# Patient Record
Sex: Female | Born: 1950
Health system: Southern US, Community
[De-identification: ages and names within clinical notes are randomized; demographics above are authoritative.]

## PROBLEM LIST (undated history)

## (undated) DIAGNOSIS — F329 Major depressive disorder, single episode, unspecified: Secondary | ICD-10-CM

## (undated) DIAGNOSIS — I10 Essential (primary) hypertension: Secondary | ICD-10-CM

## (undated) DIAGNOSIS — G4730 Sleep apnea, unspecified: Secondary | ICD-10-CM

## (undated) DIAGNOSIS — F419 Anxiety disorder, unspecified: Secondary | ICD-10-CM

## (undated) DIAGNOSIS — M1990 Unspecified osteoarthritis, unspecified site: Secondary | ICD-10-CM

## (undated) DIAGNOSIS — K219 Gastro-esophageal reflux disease without esophagitis: Secondary | ICD-10-CM

## (undated) DIAGNOSIS — F32A Depression, unspecified: Secondary | ICD-10-CM

## (undated) DIAGNOSIS — E079 Disorder of thyroid, unspecified: Secondary | ICD-10-CM

## (undated) DIAGNOSIS — J449 Chronic obstructive pulmonary disease, unspecified: Secondary | ICD-10-CM

## (undated) DIAGNOSIS — C3491 Malignant neoplasm of unspecified part of right bronchus or lung: Principal | ICD-10-CM

## (undated) HISTORY — PX: ANKLE SURGERY: SHX546

## (undated) HISTORY — DX: Depression, unspecified: F32.A

## (undated) HISTORY — DX: Chronic obstructive pulmonary disease, unspecified: J44.9

## (undated) HISTORY — PX: REPLACEMENT TOTAL KNEE: SUR1224

## (undated) HISTORY — PX: EYE MUSCLE SURGERY: SHX370

## (undated) HISTORY — PX: BREAST EXCISIONAL BIOPSY: SUR124

## (undated) HISTORY — DX: Essential (primary) hypertension: I10

## (undated) HISTORY — DX: Unspecified osteoarthritis, unspecified site: M19.90

## (undated) HISTORY — DX: Major depressive disorder, single episode, unspecified: F32.9

## (undated) HISTORY — DX: Gastro-esophageal reflux disease without esophagitis: K21.9

## (undated) HISTORY — DX: Disorder of thyroid, unspecified: E07.9

## (undated) HISTORY — PX: APPENDECTOMY: SHX54

## (undated) HISTORY — PX: THYROIDECTOMY: SHX17

## (undated) HISTORY — PX: CHOLECYSTECTOMY: SHX55

## (undated) HISTORY — DX: Malignant neoplasm of unspecified part of right bronchus or lung: C34.91

## (undated) HISTORY — DX: Anxiety disorder, unspecified: F41.9

---

## 2009-08-01 ENCOUNTER — Encounter: Admission: RE | Admit: 2009-08-01 | Discharge: 2009-08-01 | Payer: Self-pay

## 2015-01-17 DIAGNOSIS — E890 Postprocedural hypothyroidism: Secondary | ICD-10-CM

## 2015-01-17 HISTORY — DX: Postprocedural hypothyroidism: E89.0

## 2015-05-12 DIAGNOSIS — M4310 Spondylolisthesis, site unspecified: Secondary | ICD-10-CM

## 2015-05-12 HISTORY — DX: Spondylolisthesis, site unspecified: M43.10

## 2015-06-10 DIAGNOSIS — R0602 Shortness of breath: Secondary | ICD-10-CM | POA: Insufficient documentation

## 2015-07-22 DIAGNOSIS — C3491 Malignant neoplasm of unspecified part of right bronchus or lung: Secondary | ICD-10-CM | POA: Insufficient documentation

## 2015-07-22 DIAGNOSIS — E782 Mixed hyperlipidemia: Secondary | ICD-10-CM | POA: Insufficient documentation

## 2015-07-22 DIAGNOSIS — E785 Hyperlipidemia, unspecified: Secondary | ICD-10-CM | POA: Insufficient documentation

## 2015-07-26 DIAGNOSIS — Z902 Acquired absence of lung [part of]: Secondary | ICD-10-CM

## 2015-07-26 HISTORY — DX: Acquired absence of lung (part of): Z90.2

## 2015-09-03 DIAGNOSIS — R059 Cough, unspecified: Secondary | ICD-10-CM | POA: Insufficient documentation

## 2015-09-03 DIAGNOSIS — R05 Cough: Secondary | ICD-10-CM | POA: Insufficient documentation

## 2015-09-06 DIAGNOSIS — J683 Other acute and subacute respiratory conditions due to chemicals, gases, fumes and vapors: Secondary | ICD-10-CM | POA: Insufficient documentation

## 2015-09-06 HISTORY — DX: Other acute and subacute respiratory conditions due to chemicals, gases, fumes and vapors: J68.3

## 2015-12-05 DIAGNOSIS — C3411 Malignant neoplasm of upper lobe, right bronchus or lung: Secondary | ICD-10-CM

## 2016-01-01 DIAGNOSIS — C3411 Malignant neoplasm of upper lobe, right bronchus or lung: Secondary | ICD-10-CM | POA: Diagnosis not present

## 2016-01-01 DIAGNOSIS — R911 Solitary pulmonary nodule: Secondary | ICD-10-CM | POA: Diagnosis not present

## 2016-01-02 DIAGNOSIS — Z85118 Personal history of other malignant neoplasm of bronchus and lung: Secondary | ICD-10-CM

## 2016-01-27 DIAGNOSIS — R0600 Dyspnea, unspecified: Secondary | ICD-10-CM | POA: Diagnosis not present

## 2016-01-27 DIAGNOSIS — G4733 Obstructive sleep apnea (adult) (pediatric): Secondary | ICD-10-CM | POA: Diagnosis not present

## 2016-01-27 DIAGNOSIS — E559 Vitamin D deficiency, unspecified: Secondary | ICD-10-CM | POA: Diagnosis not present

## 2016-01-27 DIAGNOSIS — R911 Solitary pulmonary nodule: Secondary | ICD-10-CM | POA: Diagnosis not present

## 2016-01-27 DIAGNOSIS — J4530 Mild persistent asthma, uncomplicated: Secondary | ICD-10-CM | POA: Diagnosis not present

## 2016-01-29 DIAGNOSIS — Z139 Encounter for screening, unspecified: Secondary | ICD-10-CM | POA: Diagnosis not present

## 2016-01-29 DIAGNOSIS — E039 Hypothyroidism, unspecified: Secondary | ICD-10-CM | POA: Diagnosis not present

## 2016-01-29 DIAGNOSIS — Z85118 Personal history of other malignant neoplasm of bronchus and lung: Secondary | ICD-10-CM | POA: Diagnosis not present

## 2016-02-02 DIAGNOSIS — E039 Hypothyroidism, unspecified: Secondary | ICD-10-CM | POA: Diagnosis not present

## 2016-02-02 DIAGNOSIS — K219 Gastro-esophageal reflux disease without esophagitis: Secondary | ICD-10-CM | POA: Diagnosis not present

## 2016-02-02 DIAGNOSIS — R0602 Shortness of breath: Secondary | ICD-10-CM | POA: Diagnosis not present

## 2016-02-02 DIAGNOSIS — I10 Essential (primary) hypertension: Secondary | ICD-10-CM | POA: Diagnosis not present

## 2016-02-10 DIAGNOSIS — H2513 Age-related nuclear cataract, bilateral: Secondary | ICD-10-CM | POA: Diagnosis not present

## 2016-02-10 DIAGNOSIS — H524 Presbyopia: Secondary | ICD-10-CM | POA: Diagnosis not present

## 2016-02-10 DIAGNOSIS — H04123 Dry eye syndrome of bilateral lacrimal glands: Secondary | ICD-10-CM | POA: Diagnosis not present

## 2016-02-17 DIAGNOSIS — R300 Dysuria: Secondary | ICD-10-CM | POA: Diagnosis not present

## 2016-02-17 DIAGNOSIS — R197 Diarrhea, unspecified: Secondary | ICD-10-CM | POA: Diagnosis not present

## 2016-02-17 DIAGNOSIS — N390 Urinary tract infection, site not specified: Secondary | ICD-10-CM | POA: Diagnosis not present

## 2016-02-26 DIAGNOSIS — C3411 Malignant neoplasm of upper lobe, right bronchus or lung: Secondary | ICD-10-CM | POA: Diagnosis not present

## 2016-03-01 DIAGNOSIS — E039 Hypothyroidism, unspecified: Secondary | ICD-10-CM | POA: Diagnosis not present

## 2016-03-03 DIAGNOSIS — M9901 Segmental and somatic dysfunction of cervical region: Secondary | ICD-10-CM | POA: Diagnosis not present

## 2016-03-03 DIAGNOSIS — M9903 Segmental and somatic dysfunction of lumbar region: Secondary | ICD-10-CM | POA: Diagnosis not present

## 2016-03-03 DIAGNOSIS — N390 Urinary tract infection, site not specified: Secondary | ICD-10-CM | POA: Diagnosis not present

## 2016-03-03 DIAGNOSIS — M5417 Radiculopathy, lumbosacral region: Secondary | ICD-10-CM | POA: Diagnosis not present

## 2016-03-03 DIAGNOSIS — M531 Cervicobrachial syndrome: Secondary | ICD-10-CM | POA: Diagnosis not present

## 2016-03-03 DIAGNOSIS — R300 Dysuria: Secondary | ICD-10-CM | POA: Diagnosis not present

## 2016-03-03 DIAGNOSIS — O872 Hemorrhoids in the puerperium: Secondary | ICD-10-CM | POA: Diagnosis not present

## 2016-03-31 DIAGNOSIS — Z139 Encounter for screening, unspecified: Secondary | ICD-10-CM | POA: Diagnosis not present

## 2016-03-31 DIAGNOSIS — E039 Hypothyroidism, unspecified: Secondary | ICD-10-CM | POA: Diagnosis not present

## 2016-04-01 DIAGNOSIS — F419 Anxiety disorder, unspecified: Secondary | ICD-10-CM | POA: Diagnosis not present

## 2016-04-01 DIAGNOSIS — I10 Essential (primary) hypertension: Secondary | ICD-10-CM | POA: Diagnosis not present

## 2016-04-01 DIAGNOSIS — E039 Hypothyroidism, unspecified: Secondary | ICD-10-CM | POA: Diagnosis not present

## 2016-04-01 DIAGNOSIS — F4321 Adjustment disorder with depressed mood: Secondary | ICD-10-CM | POA: Diagnosis not present

## 2016-04-28 DIAGNOSIS — F4321 Adjustment disorder with depressed mood: Secondary | ICD-10-CM | POA: Diagnosis not present

## 2016-04-28 DIAGNOSIS — E559 Vitamin D deficiency, unspecified: Secondary | ICD-10-CM | POA: Diagnosis not present

## 2016-04-28 DIAGNOSIS — Z139 Encounter for screening, unspecified: Secondary | ICD-10-CM | POA: Diagnosis not present

## 2016-04-28 DIAGNOSIS — F419 Anxiety disorder, unspecified: Secondary | ICD-10-CM | POA: Diagnosis not present

## 2016-04-28 DIAGNOSIS — I10 Essential (primary) hypertension: Secondary | ICD-10-CM | POA: Diagnosis not present

## 2016-04-28 DIAGNOSIS — E039 Hypothyroidism, unspecified: Secondary | ICD-10-CM | POA: Diagnosis not present

## 2016-04-29 DIAGNOSIS — J439 Emphysema, unspecified: Secondary | ICD-10-CM | POA: Diagnosis not present

## 2016-04-29 DIAGNOSIS — J432 Centrilobular emphysema: Secondary | ICD-10-CM | POA: Diagnosis not present

## 2016-04-29 DIAGNOSIS — R911 Solitary pulmonary nodule: Secondary | ICD-10-CM | POA: Diagnosis not present

## 2016-04-29 DIAGNOSIS — I2510 Atherosclerotic heart disease of native coronary artery without angina pectoris: Secondary | ICD-10-CM | POA: Diagnosis not present

## 2016-04-29 DIAGNOSIS — C3411 Malignant neoplasm of upper lobe, right bronchus or lung: Secondary | ICD-10-CM | POA: Diagnosis not present

## 2016-04-30 DIAGNOSIS — C3411 Malignant neoplasm of upper lobe, right bronchus or lung: Secondary | ICD-10-CM | POA: Diagnosis not present

## 2016-04-30 DIAGNOSIS — Z85118 Personal history of other malignant neoplasm of bronchus and lung: Secondary | ICD-10-CM | POA: Diagnosis not present

## 2016-05-19 DIAGNOSIS — F4321 Adjustment disorder with depressed mood: Secondary | ICD-10-CM | POA: Diagnosis not present

## 2016-05-19 DIAGNOSIS — F419 Anxiety disorder, unspecified: Secondary | ICD-10-CM | POA: Diagnosis not present

## 2016-05-19 DIAGNOSIS — K219 Gastro-esophageal reflux disease without esophagitis: Secondary | ICD-10-CM | POA: Diagnosis not present

## 2016-05-19 DIAGNOSIS — I10 Essential (primary) hypertension: Secondary | ICD-10-CM | POA: Diagnosis not present

## 2016-05-24 DIAGNOSIS — L7601 Intraoperative hemorrhage and hematoma of skin and subcutaneous tissue complicating a dermatologic procedure: Secondary | ICD-10-CM | POA: Diagnosis not present

## 2016-05-24 DIAGNOSIS — Z139 Encounter for screening, unspecified: Secondary | ICD-10-CM | POA: Diagnosis not present

## 2016-06-04 DIAGNOSIS — S51822A Laceration with foreign body of left forearm, initial encounter: Secondary | ICD-10-CM | POA: Diagnosis not present

## 2016-06-04 DIAGNOSIS — L7632 Postprocedural hematoma of skin and subcutaneous tissue following other procedure: Secondary | ICD-10-CM | POA: Diagnosis not present

## 2016-06-07 DIAGNOSIS — L7632 Postprocedural hematoma of skin and subcutaneous tissue following other procedure: Secondary | ICD-10-CM | POA: Diagnosis not present

## 2016-06-07 DIAGNOSIS — Z139 Encounter for screening, unspecified: Secondary | ICD-10-CM | POA: Diagnosis not present

## 2016-06-07 DIAGNOSIS — S51822D Laceration with foreign body of left forearm, subsequent encounter: Secondary | ICD-10-CM | POA: Diagnosis not present

## 2016-06-14 DIAGNOSIS — S51822D Laceration with foreign body of left forearm, subsequent encounter: Secondary | ICD-10-CM | POA: Diagnosis not present

## 2016-06-14 DIAGNOSIS — L7632 Postprocedural hematoma of skin and subcutaneous tissue following other procedure: Secondary | ICD-10-CM | POA: Diagnosis not present

## 2016-07-01 ENCOUNTER — Encounter: Payer: Self-pay | Admitting: Internal Medicine

## 2016-07-13 DIAGNOSIS — E039 Hypothyroidism, unspecified: Secondary | ICD-10-CM | POA: Diagnosis not present

## 2016-07-13 DIAGNOSIS — I10 Essential (primary) hypertension: Secondary | ICD-10-CM | POA: Diagnosis not present

## 2016-07-13 DIAGNOSIS — Z139 Encounter for screening, unspecified: Secondary | ICD-10-CM | POA: Diagnosis not present

## 2016-07-14 DIAGNOSIS — F4321 Adjustment disorder with depressed mood: Secondary | ICD-10-CM | POA: Diagnosis not present

## 2016-07-14 DIAGNOSIS — L719 Rosacea, unspecified: Secondary | ICD-10-CM | POA: Diagnosis not present

## 2016-07-14 DIAGNOSIS — E039 Hypothyroidism, unspecified: Secondary | ICD-10-CM | POA: Diagnosis not present

## 2016-07-14 DIAGNOSIS — I10 Essential (primary) hypertension: Secondary | ICD-10-CM | POA: Diagnosis not present

## 2016-07-23 ENCOUNTER — Other Ambulatory Visit: Payer: Self-pay | Admitting: Medical Oncology

## 2016-07-23 DIAGNOSIS — C3490 Malignant neoplasm of unspecified part of unspecified bronchus or lung: Secondary | ICD-10-CM

## 2016-07-26 ENCOUNTER — Ambulatory Visit (HOSPITAL_BASED_OUTPATIENT_CLINIC_OR_DEPARTMENT_OTHER): Payer: Medicare Other | Admitting: Internal Medicine

## 2016-07-26 ENCOUNTER — Other Ambulatory Visit (HOSPITAL_BASED_OUTPATIENT_CLINIC_OR_DEPARTMENT_OTHER): Payer: Medicare Other

## 2016-07-26 ENCOUNTER — Other Ambulatory Visit: Payer: Medicare Other

## 2016-07-26 ENCOUNTER — Encounter: Payer: Self-pay | Admitting: Internal Medicine

## 2016-07-26 ENCOUNTER — Telehealth: Payer: Self-pay | Admitting: Internal Medicine

## 2016-07-26 DIAGNOSIS — E039 Hypothyroidism, unspecified: Secondary | ICD-10-CM

## 2016-07-26 DIAGNOSIS — C3491 Malignant neoplasm of unspecified part of right bronchus or lung: Secondary | ICD-10-CM

## 2016-07-26 DIAGNOSIS — F329 Major depressive disorder, single episode, unspecified: Secondary | ICD-10-CM | POA: Diagnosis not present

## 2016-07-26 DIAGNOSIS — C3411 Malignant neoplasm of upper lobe, right bronchus or lung: Secondary | ICD-10-CM

## 2016-07-26 DIAGNOSIS — F411 Generalized anxiety disorder: Secondary | ICD-10-CM

## 2016-07-26 DIAGNOSIS — J449 Chronic obstructive pulmonary disease, unspecified: Secondary | ICD-10-CM

## 2016-07-26 DIAGNOSIS — C3490 Malignant neoplasm of unspecified part of unspecified bronchus or lung: Secondary | ICD-10-CM

## 2016-07-26 DIAGNOSIS — I10 Essential (primary) hypertension: Secondary | ICD-10-CM

## 2016-07-26 HISTORY — DX: Malignant neoplasm of unspecified part of right bronchus or lung: C34.91

## 2016-07-26 LAB — CBC WITH DIFFERENTIAL/PLATELET
BASO%: 0.4 % (ref 0.0–2.0)
Basophils Absolute: 0 10*3/uL (ref 0.0–0.1)
EOS%: 1.2 % (ref 0.0–7.0)
Eosinophils Absolute: 0.1 10*3/uL (ref 0.0–0.5)
HCT: 39.2 % (ref 34.8–46.6)
HGB: 12.5 g/dL (ref 11.6–15.9)
LYMPH%: 25.3 % (ref 14.0–49.7)
MCH: 27.8 pg (ref 25.1–34.0)
MCHC: 31.9 g/dL (ref 31.5–36.0)
MCV: 87.1 fL (ref 79.5–101.0)
MONO#: 0.4 10*3/uL (ref 0.1–0.9)
MONO%: 8.8 % (ref 0.0–14.0)
NEUT#: 3.2 10*3/uL (ref 1.5–6.5)
NEUT%: 64.3 % (ref 38.4–76.8)
Platelets: 233 10*3/uL (ref 145–400)
RBC: 4.5 10*6/uL (ref 3.70–5.45)
RDW: 14.7 % — ABNORMAL HIGH (ref 11.2–14.5)
WBC: 5 10*3/uL (ref 3.9–10.3)
lymph#: 1.3 10*3/uL (ref 0.9–3.3)

## 2016-07-26 LAB — COMPREHENSIVE METABOLIC PANEL
ALBUMIN: 3.7 g/dL (ref 3.5–5.0)
ALK PHOS: 121 U/L (ref 40–150)
ALT: 17 U/L (ref 0–55)
AST: 15 U/L (ref 5–34)
Anion Gap: 9 mEq/L (ref 3–11)
BILIRUBIN TOTAL: 0.51 mg/dL (ref 0.20–1.20)
BUN: 20.9 mg/dL (ref 7.0–26.0)
CO2: 26 mEq/L (ref 22–29)
Calcium: 9.4 mg/dL (ref 8.4–10.4)
Chloride: 106 mEq/L (ref 98–109)
Creatinine: 0.7 mg/dL (ref 0.6–1.1)
EGFR: 85 mL/min/{1.73_m2} — AB (ref >90)
GLUCOSE: 93 mg/dL (ref 70–140)
Potassium: 3.7 mEq/L (ref 3.5–5.1)
SODIUM: 140 meq/L (ref 136–145)
TOTAL PROTEIN: 6.8 g/dL (ref 6.4–8.3)

## 2016-07-26 NOTE — Telephone Encounter (Signed)
Gave pt cal & avs °

## 2016-07-26 NOTE — Progress Notes (Signed)
[facility] Telephone:([phone]) [phone]   [phone]  CONSULT NOTE  REFERRING PHYSICIAN: Dr. Kristine Royal, [place], [place]  REASON FOR CONSULTATION:  65 years old white female with history of lung cancer.  HPI Victoria Pena is a 65 y.o. female was past medical history significant for depression/anxiety, hypothyroidism, GERD, hypertension and long history of smoking but quit in 2000. The patient was diagnosed with a stage IB (T2a, N0, M0) non-small cell lung cancer, adenocarcinoma status post right upper lobectomy with lymph node dissection in [facility] on 07/22/2015. She mentioned that she was visiting her daughter and she was complaining of low back pain. She was scheduled for back surgery and chest x-ray showed a nodule in the right upper lobe. This was followed by imaging studies including CT scan of the chest, PET scan as well as MRI of the brain that showed no other evidence of metastatic disease. She underwent right upper lobectomy with lymph node dissection and the tumor size was 4.1 cm with visceral pleural invasion. Following her surgical resection the patient underwent 4 cycles of adjuvant systemic chemotherapy with cisplatin and Alimta completed 11/17/2015 under the care of Dr. Ala Dach. She has been on observation since that time and she was seen in January 2017 by Dr. Bobby Rumpf in [place]. Repeat CT scan of the chest at that time showed questionable left upper lobe nodule and on repeat CT scan of the chest in May 2017 it decreased in size, close to resolution. The patient had some concerns about continuing her care in [place]. She is planning to go back to [place] at some point. She was referred by her primary oncologist to me today for further evaluation and to establish care with me. When seen today the patient is feeling fine with no specific complaints except for shortness breath with exertion and occasional right-sided chest pain. She exercises  at regular basis and she intentionally lost around 27 pounds. She has no nausea, vomiting, diarrhea or constipation. She has no headache or visual changes. She denied having any cough or hemoptysis. Family history significant for several family members with cancer including mother with female genitalia cancer, brother and father with bone cancer. The patient is a widow and has 3 children. Her son lives in [place]. She used to work as a Chief Strategy Officer. She has a history of smoking more than one pack per day for 35 years and she quit in 2000. She has no history of alcohol or drug abuse.  HPI  Past Medical History:  Diagnosis Date  . Adenocarcinoma of right lung, stage 1 ([place]) 07/26/2016  . Anxiety   . Arthritis   . COPD (chronic obstructive pulmonary disease) ([place])   . Depression   . GERD (gastroesophageal reflux disease)   . Hypertension   . Thyroid disease     Past Surgical History:  Procedure Laterality Date  . APPENDECTOMY    . CHOLECYSTECTOMY    . THYROIDECTOMY      Family History  Problem Relation Age of Onset  . Cancer Mother   . Cancer Father   . Cancer Brother     Social History Social History  Substance Use Topics  . Smoking status: Former Smoker    Packs/day: 1.50    Years: 35.00    Quit date: 07/27/1999  . Smokeless tobacco: Never Used  . Alcohol use No    Allergies  Allergen Reactions  . Dextromethorphan Hbr Shortness Of Breath  . Keflex [Cephalexin] Shortness Of  Breath    Hard time breathing  . Hydrocodone Other (See Comments)    Heart racing, hard to concentrate, face flushed  . Hydroxyzine Other (See Comments)    Numbness in lips  . Prednisone & Diphenhydramine Other (See Comments)    Face flushed, heart racing  . Avelox [Moxifloxacin Hcl In Nacl] Rash    Current Outpatient Prescriptions  Medication Sig Dispense Refill  . Albuterol (VENTOLIN IN) Inhale into the lungs.    . ALPRAZolam (XANAX) 0.5 MG tablet Take 0.5 mg by mouth at bedtime as  needed for anxiety.    Marland Kitchen aspirin 81 MG tablet Take 81 mg by mouth daily.    Marland Kitchen buPROPion (WELLBUTRIN XL) 150 MG 24 hr tablet Take 150 mg by mouth daily.    Marland Kitchen levothyroxine (SYNTHROID, LEVOTHROID) 125 MCG tablet Take 125 mcg by mouth daily before breakfast.    . pantoprazole (PROTONIX) 40 MG tablet Take 40 mg by mouth daily.     No current facility-administered medications for this visit.     Review of Systems  Constitutional: positive for weight loss Eyes: negative Ears, nose, mouth, throat, and face: negative Respiratory: positive for dyspnea on exertion Cardiovascular: negative Gastrointestinal: negative Genitourinary:negative Integument/breast: negative Hematologic/lymphatic: negative Musculoskeletal:negative Neurological: negative Behavioral/Psych: negative Endocrine: negative Allergic/Immunologic: negative  Physical Exam  YKD:XIPJA, healthy, no distress, well nourished and well developed SKIN: skin color, texture, turgor are normal, no rashes or significant lesions HEAD: Normocephalic, No masses, lesions, tenderness or abnormalities EYES: normal, PERRLA, Conjunctiva are pink and non-injected EARS: External ears normal, Canals clear OROPHARYNX:no exudate, no erythema and lips, buccal mucosa, and tongue normal  NECK: supple, no adenopathy, no JVD LYMPH:  no palpable lymphadenopathy, no hepatosplenomegaly BREAST:not examined LUNGS: clear to auscultation , and palpation HEART: regular rate & rhythm, no murmurs and no gallops ABDOMEN:abdomen soft, non-tender, normal bowel sounds and no masses or organomegaly BACK: Back symmetric, no curvature., No CVA tenderness EXTREMITIES:no joint deformities, effusion, or inflammation, no edema, no skin discoloration  NEURO: alert & oriented x 3 with fluent speech, no focal motor/sensory deficits  PERFORMANCE STATUS: ECOG 0  LABORATORY DATA: Lab Results  Component Value Date   WBC 5.0 07/26/2016   HGB 12.5 07/26/2016   HCT 39.2  07/26/2016   MCV 87.1 07/26/2016   PLT 233 07/26/2016      Chemistry      Component Value Date/Time   NA 140 07/26/2016 1353   K 3.7 07/26/2016 1353   CO2 26 07/26/2016 1353   BUN 20.9 07/26/2016 1353   CREATININE 0.7 07/26/2016 1353      Component Value Date/Time   CALCIUM 9.4 07/26/2016 1353   ALKPHOS 121 07/26/2016 1353   AST 15 07/26/2016 1353   ALT 17 07/26/2016 1353   BILITOT 0.51 07/26/2016 1353       RADIOGRAPHIC STUDIES: No results found.  ASSESSMENT: This is a very pleasant 65 years old white female diagnosed with a stage IB (T2a, N0, M0) non-small cell lung cancer, adenocarcinoma status post right upper lobectomy with lymph node dissection followed by 4 cycles of adjuvant systemic chemotherapy with cisplatin and Alimta completed in November 2016.   PLAN: I had a lengthy discussion with the patient today about her current disease stage, prognosis and treatment options. I explained to the patient that she received the best standard of care at this point, including right upper lobectomy with lymph node dissection followed by 4 cycles of adjuvant systemic chemotherapy with cisplatin and Alimta. I also explained to  the patient that she would continue on observation for now with repeat CT scan of the chest every 6 months. Her last scan was performed in May 2017. I will arrange for the patient to have repeat CT scan of the chest in November 2017. I will see her back for follow-up visit at that time for reevaluation and discussion of her scan results. The patient was advised to call immediately if she has any concerning symptoms in the interval. The patient voices understanding of current disease status and treatment options and is in agreement with the current care plan.  All questions were answered. The patient knows to call the clinic with any problems, questions or concerns. We can certainly see the patient much sooner if necessary.  Thank you so much for allowing me to  participate in the care of Victoria Pena. I will continue to follow up the patient with you and assist in her care.  I spent 40 minutes counseling the patient face to face. The total time spent in the appointment was 60 minutes.  Disclaimer: This note was dictated with voice recognition software. Similar sounding words can inadvertently be transcribed and may not be corrected upon review.   Victoria Pena K. July 26, 2016, 2:57 PM

## 2016-08-31 DIAGNOSIS — R300 Dysuria: Secondary | ICD-10-CM | POA: Diagnosis not present

## 2016-08-31 DIAGNOSIS — N390 Urinary tract infection, site not specified: Secondary | ICD-10-CM | POA: Diagnosis not present

## 2016-09-09 DIAGNOSIS — K623 Rectal prolapse: Secondary | ICD-10-CM | POA: Diagnosis not present

## 2016-09-09 DIAGNOSIS — Z01419 Encounter for gynecological examination (general) (routine) without abnormal findings: Secondary | ICD-10-CM | POA: Diagnosis not present

## 2016-09-28 DIAGNOSIS — E039 Hypothyroidism, unspecified: Secondary | ICD-10-CM | POA: Diagnosis not present

## 2016-09-28 DIAGNOSIS — Z23 Encounter for immunization: Secondary | ICD-10-CM | POA: Diagnosis not present

## 2016-09-28 DIAGNOSIS — I10 Essential (primary) hypertension: Secondary | ICD-10-CM | POA: Diagnosis not present

## 2016-09-29 DIAGNOSIS — E782 Mixed hyperlipidemia: Secondary | ICD-10-CM | POA: Diagnosis not present

## 2016-09-29 DIAGNOSIS — E039 Hypothyroidism, unspecified: Secondary | ICD-10-CM | POA: Diagnosis not present

## 2016-09-30 DIAGNOSIS — E039 Hypothyroidism, unspecified: Secondary | ICD-10-CM | POA: Diagnosis not present

## 2016-09-30 DIAGNOSIS — Z1389 Encounter for screening for other disorder: Secondary | ICD-10-CM | POA: Diagnosis not present

## 2016-09-30 DIAGNOSIS — F419 Anxiety disorder, unspecified: Secondary | ICD-10-CM | POA: Diagnosis not present

## 2016-09-30 DIAGNOSIS — R609 Edema, unspecified: Secondary | ICD-10-CM | POA: Diagnosis not present

## 2016-09-30 DIAGNOSIS — F4321 Adjustment disorder with depressed mood: Secondary | ICD-10-CM | POA: Diagnosis not present

## 2016-09-30 DIAGNOSIS — K219 Gastro-esophageal reflux disease without esophagitis: Secondary | ICD-10-CM | POA: Diagnosis not present

## 2016-09-30 DIAGNOSIS — Z79899 Other long term (current) drug therapy: Secondary | ICD-10-CM | POA: Diagnosis not present

## 2016-09-30 DIAGNOSIS — R799 Abnormal finding of blood chemistry, unspecified: Secondary | ICD-10-CM | POA: Diagnosis not present

## 2016-10-01 ENCOUNTER — Encounter: Payer: Self-pay | Admitting: Sports Medicine

## 2016-10-01 ENCOUNTER — Ambulatory Visit (INDEPENDENT_AMBULATORY_CARE_PROVIDER_SITE_OTHER): Payer: Medicare Other | Admitting: Sports Medicine

## 2016-10-01 DIAGNOSIS — M76821 Posterior tibial tendinitis, right leg: Secondary | ICD-10-CM

## 2016-10-01 DIAGNOSIS — M79672 Pain in left foot: Secondary | ICD-10-CM | POA: Diagnosis not present

## 2016-10-01 DIAGNOSIS — M2142 Flat foot [pes planus] (acquired), left foot: Secondary | ICD-10-CM

## 2016-10-01 DIAGNOSIS — M76822 Posterior tibial tendinitis, left leg: Principal | ICD-10-CM

## 2016-10-01 DIAGNOSIS — M722 Plantar fascial fibromatosis: Secondary | ICD-10-CM

## 2016-10-01 DIAGNOSIS — M2141 Flat foot [pes planus] (acquired), right foot: Secondary | ICD-10-CM | POA: Diagnosis not present

## 2016-10-01 DIAGNOSIS — M79671 Pain in right foot: Secondary | ICD-10-CM | POA: Diagnosis not present

## 2016-10-01 MED ORDER — MELOXICAM 15 MG PO TABS
15.0000 mg | ORAL_TABLET | Freq: Every day | ORAL | 0 refills | Status: DC
Start: 2016-10-01 — End: 2017-03-16

## 2016-10-01 NOTE — Progress Notes (Signed)
Subjective: Victoria Pena is a 65 y.o. female patient who presents to office for evaluation for orthotics. Patient states that her feet feel sore at ankle and along inside of foot and ankle even where she had tendon surgery many years ago. Patient denies any other pedal complaints.   Patient Active Problem List   Diagnosis Date Noted  . Adenocarcinoma of right lung, stage 1 ([place]) 07/26/2016   Current Outpatient Prescriptions on File Prior to Visit  Medication Sig Dispense Refill  . Albuterol (VENTOLIN IN) Inhale into the lungs.    . ALPRAZolam (XANAX) 0.5 MG tablet Take 0.5 mg by mouth at bedtime as needed for anxiety.    Marland Kitchen aspirin 81 MG tablet Take 81 mg by mouth daily.    Marland Kitchen buPROPion (WELLBUTRIN XL) 150 MG 24 hr tablet Take 150 mg by mouth daily.    Marland Kitchen levothyroxine (SYNTHROID, LEVOTHROID) 125 MCG tablet Take 125 mcg by mouth daily before breakfast.    . pantoprazole (PROTONIX) 40 MG tablet Take 40 mg by mouth daily.     No current facility-administered medications on file prior to visit.    Allergies  Allergen Reactions  . Dextromethorphan Hbr Shortness Of Breath  . Keflex [Cephalexin] Shortness Of Breath    Hard time breathing  . Hydrocodone Other (See Comments) and Anxiety    Heart racing, hard to concentrate, face flushed  . Hydroxyzine Other (See Comments) and Swelling    Numbness in lips  . Prednisone & Diphenhydramine Other (See Comments)    Face flushed, heart racing  . Prednisone Other (See Comments)  . Avelox [Moxifloxacin Hcl In Nacl] Rash  . Moxifloxacin Rash     Objective:  General: Alert and oriented x3 in no acute distress  Dermatology: No open lesions bilateral lower extremities, no webspace macerations, no ecchymosis bilateral, all nails x 10 are well manicured.  Vascular: Dorsalis Pedis and Posterior Tibial pedal pulses 1/4, Capillary Fill Time 3 seconds, (+) pedal hair growth bilateral, + varicosities, no edema bilateral lower extremities, Temperature  gradient within normal limits.  Neurology: Gross sensation intact via light touch bilateral, Protective sensation intact with Thornell Mule Monofilament to all pedal sites,  No babinski sign present bilateral. (-) Tinels sign right or left foot.   Musculoskeletal: Minimal tenderness with palpation along medial arch, medial fascial band on Right>Left, minimal tenderness along Posterior tibial tendon course with medial soft tissue buldge noted, there is decreased ankle rom with knee extending  vs flexed resembling gastroc equnius bilateral with short limb on right by 1/4 in, Subtalar joint range of motion is within normal limits, there is no 1st ray hypermobility noted bilateral, decreased 1st MPJ rom Right>Left with functional limitus noted on weightbearing exam, there is medial arch collapse Right> Left on weightbearing exam,slight RF valgus Right> Left, no "too-many toes" sign appreciated.  Assessment and Plan: Problem List Items Addressed This Visit    None    Visit Diagnoses    Posterior tibial tendon dysfunction (PTTD) of both lower extremities    -  Primary   Relevant Medications   meloxicam (MOBIC) 15 MG tablet   Pes planus of both feet       Plantar fasciitis       Foot pain, bilateral       Relevant Medications   meloxicam (MOBIC) 15 MG tablet      -Complete examination performed -Xrays reviewed -Discussed treatement options -Recommend compression socks elastic therapy for occasional swelling -Rx Mobic for "soreness" in feet -Applied  felt heel lift to right OTC insert -Patient to return for casting with [facility]; Rx will allow to sulcus, heel lift on right and since patient is active will make for sports conditions. ABN for orthotics signed at today's visit; will bill when patient is casted -Patient to return to office for casting or sooner if condition worsens.  Landis Martins, DPM

## 2016-10-18 ENCOUNTER — Other Ambulatory Visit: Payer: Medicare Other

## 2016-10-28 ENCOUNTER — Other Ambulatory Visit (HOSPITAL_BASED_OUTPATIENT_CLINIC_OR_DEPARTMENT_OTHER): Payer: Medicare Other

## 2016-10-28 ENCOUNTER — Ambulatory Visit (HOSPITAL_COMMUNITY)
Admission: RE | Admit: 2016-10-28 | Discharge: 2016-10-28 | Disposition: A | Discharge status: Home / Self Care | Payer: Medicare Other | Source: Ambulatory Visit | Attending: Internal Medicine | Admitting: Internal Medicine

## 2016-10-28 DIAGNOSIS — C3491 Malignant neoplasm of unspecified part of right bronchus or lung: Secondary | ICD-10-CM

## 2016-10-28 DIAGNOSIS — J439 Emphysema, unspecified: Secondary | ICD-10-CM | POA: Insufficient documentation

## 2016-10-28 DIAGNOSIS — J984 Other disorders of lung: Secondary | ICD-10-CM | POA: Insufficient documentation

## 2016-10-28 DIAGNOSIS — C3411 Malignant neoplasm of upper lobe, right bronchus or lung: Secondary | ICD-10-CM | POA: Diagnosis present

## 2016-10-28 LAB — CBC WITH DIFFERENTIAL/PLATELET
BASO%: 0.7 % (ref 0.0–2.0)
Basophils Absolute: 0 10*3/uL (ref 0.0–0.1)
EOS%: 1.6 % (ref 0.0–7.0)
Eosinophils Absolute: 0.1 10*3/uL (ref 0.0–0.5)
HEMATOCRIT: 41 % (ref 34.8–46.6)
HGB: 13.1 g/dL (ref 11.6–15.9)
LYMPH#: 1.3 10*3/uL (ref 0.9–3.3)
LYMPH%: 26.6 % (ref 14.0–49.7)
MCH: 27.6 pg (ref 25.1–34.0)
MCHC: 32.1 g/dL (ref 31.5–36.0)
MCV: 86.2 fL (ref 79.5–101.0)
MONO#: 0.5 10*3/uL (ref 0.1–0.9)
MONO%: 9.1 % (ref 0.0–14.0)
NEUT%: 62 % (ref 38.4–76.8)
NEUTROS ABS: 3.1 10*3/uL (ref 1.5–6.5)
PLATELETS: 231 10*3/uL (ref 145–400)
RBC: 4.75 10*6/uL (ref 3.70–5.45)
RDW: 15 % — AB (ref 11.2–14.5)
WBC: 5 10*3/uL (ref 3.9–10.3)

## 2016-10-28 LAB — COMPREHENSIVE METABOLIC PANEL
ALT: 11 U/L (ref 0–55)
ANION GAP: 7 meq/L (ref 3–11)
AST: 14 U/L (ref 5–34)
Albumin: 3.4 g/dL — ABNORMAL LOW (ref 3.5–5.0)
Alkaline Phosphatase: 142 U/L (ref 40–150)
BILIRUBIN TOTAL: 0.59 mg/dL (ref 0.20–1.20)
BUN: 21 mg/dL (ref 7.0–26.0)
CO2: 25 meq/L (ref 22–29)
CREATININE: 0.8 mg/dL (ref 0.6–1.1)
Calcium: 9.3 mg/dL (ref 8.4–10.4)
Chloride: 107 mEq/L (ref 98–109)
EGFR: 81 mL/min/{1.73_m2} — ABNORMAL LOW (ref >90)
Glucose: 95 mg/dl (ref 70–140)
Potassium: 4.2 mEq/L (ref 3.5–5.1)
Sodium: 139 mEq/L (ref 136–145)
TOTAL PROTEIN: 6.8 g/dL (ref 6.4–8.3)

## 2016-10-28 MED ORDER — IOPAMIDOL (ISOVUE-300) INJECTION 61%
75.0000 mL | Freq: Once | INTRAVENOUS | Status: AC | PRN
  Administered 2016-10-28: 75 mL via INTRAVENOUS

## 2016-11-04 ENCOUNTER — Ambulatory Visit (INDEPENDENT_AMBULATORY_CARE_PROVIDER_SITE_OTHER): Payer: Medicare Other | Admitting: Sports Medicine

## 2016-11-04 DIAGNOSIS — M722 Plantar fascial fibromatosis: Secondary | ICD-10-CM

## 2016-11-04 DIAGNOSIS — M79672 Pain in left foot: Secondary | ICD-10-CM

## 2016-11-04 DIAGNOSIS — M2141 Flat foot [pes planus] (acquired), right foot: Secondary | ICD-10-CM

## 2016-11-04 DIAGNOSIS — M76822 Posterior tibial tendinitis, left leg: Principal | ICD-10-CM

## 2016-11-04 DIAGNOSIS — M79671 Pain in right foot: Secondary | ICD-10-CM

## 2016-11-04 DIAGNOSIS — M76821 Posterior tibial tendinitis, right leg: Secondary | ICD-10-CM

## 2016-11-04 DIAGNOSIS — M2142 Flat foot [pes planus] (acquired), left foot: Secondary | ICD-10-CM

## 2016-11-04 NOTE — Progress Notes (Signed)
Patient was seen by Asst. and casted for custom orthotics. Prescription sent to [facility] for custom functional foot orthotics to level of the sulcus with external heel post with 1/4 inch heel lift on right poly-pro firm with poron and vinyl top cover Patient to return for pickup of orthotics, When made available. -Dr. Cannon Kettle

## 2016-11-09 ENCOUNTER — Encounter: Payer: Self-pay | Admitting: Internal Medicine

## 2016-11-09 ENCOUNTER — Telehealth: Payer: Self-pay | Admitting: Internal Medicine

## 2016-11-09 ENCOUNTER — Ambulatory Visit (HOSPITAL_BASED_OUTPATIENT_CLINIC_OR_DEPARTMENT_OTHER): Payer: Medicare Other | Admitting: Internal Medicine

## 2016-11-09 VITALS — BP 137/81 | HR 72 | Temp 98.2°F | Resp 18 | Ht 64.0 in | Wt 165.0 lb

## 2016-11-09 DIAGNOSIS — C3411 Malignant neoplasm of upper lobe, right bronchus or lung: Secondary | ICD-10-CM | POA: Diagnosis not present

## 2016-11-09 DIAGNOSIS — C3491 Malignant neoplasm of unspecified part of right bronchus or lung: Secondary | ICD-10-CM

## 2016-11-09 NOTE — Progress Notes (Signed)
[facility] Telephone:([phone]   [phone]  OFFICE PROGRESS NOTE  Victoria Pena, [place] [address]  DIAGNOSIS: stage IB (T2a, N0, M0) non-small cell lung cancer, adenocarcinoma diagnosed in July 2016  PRIOR THERAPY: 1) status post right upper lobectomy with lymph node dissection on 07/22/2015 in [place]. 2) adjuvant systemic chemotherapy with cisplatin and Alimta status post 4 cycles completed in November 2016.  CURRENT THERAPY: Observation.  INTERVAL HISTORY: Victoria Pena 65 y.o. female returns to the clinic today for six-month follow-up visit. The patient is feeling fine today with no specific complaints. She gained around 10 pounds recently because she has to family reunion and she ate a lot. She started exercising again to lose weight. She continues to have occasional pain on the right side of the chest from the surgical scar. She denied having any shortness breath, cough or hemoptysis. She has no nausea or vomiting, no fever or chills. She had repeat CT scan of the chest performed recently and she is here for evaluation and discussion of her scan results.  MEDICAL HISTORY: Past Medical History:  Diagnosis Date  . Adenocarcinoma of right lung, stage 1 ([facility]) 07/26/2016  . Anxiety   . Arthritis   . COPD (chronic obstructive pulmonary disease) ([facility])   . Depression   . GERD (gastroesophageal reflux disease)   . Hypertension   . Thyroid disease     ALLERGIES:  is allergic to dextromethorphan hbr; keflex [cephalexin]; hydrocodone; hydroxyzine; prednisone & diphenhydramine; prednisone; avelox [moxifloxacin hcl in nacl]; and moxifloxacin.  MEDICATIONS:  Current Outpatient Prescriptions  Medication Sig Dispense Refill  . Albuterol (VENTOLIN IN) Inhale into the lungs.    . ALPRAZolam (XANAX) 0.5 MG tablet Take 0.5 mg by mouth at bedtime as needed for anxiety.    Marland Kitchen aspirin 81 MG tablet Take 81 mg by mouth  daily.    Marland Kitchen buPROPion (WELLBUTRIN XL) 150 MG 24 hr tablet Take 150 mg by mouth daily.    Marland Kitchen levothyroxine (SYNTHROID, LEVOTHROID) 125 MCG tablet Take 125 mcg by mouth daily before breakfast.    . meloxicam (MOBIC) 15 MG tablet Take 1 tablet (15 mg total) by mouth daily. 30 tablet 0  . pantoprazole (PROTONIX) 40 MG tablet Take 40 mg by mouth daily.     No current facility-administered medications for this visit.     SURGICAL HISTORY:  Past Surgical History:  Procedure Laterality Date  . APPENDECTOMY    . CHOLECYSTECTOMY    . THYROIDECTOMY      REVIEW OF SYSTEMS:  A comprehensive review of systems was negative.   PHYSICAL EXAMINATION: General appearance: alert, cooperative and no distress Head: Normocephalic, without obvious abnormality, atraumatic Neck: no adenopathy, no JVD, supple, symmetrical, trachea midline and thyroid not enlarged, symmetric, no tenderness/mass/nodules Lymph nodes: Cervical, supraclavicular, and axillary nodes normal. Resp: clear to auscultation bilaterally Back: symmetric, no curvature. ROM normal. No CVA tenderness. Cardio: regular rate and rhythm, S1, S2 normal, no murmur, click, rub or gallop GI: soft, non-tender; bowel sounds normal; no masses,  no organomegaly Extremities: extremities normal, atraumatic, no cyanosis or edema  ECOG PERFORMANCE STATUS: 1 - Symptomatic but completely ambulatory  Blood pressure 137/81, pulse 72, temperature 98.2 F (36.8 C), temperature source Oral, resp. rate 18, height '5\' 4"'$  (1.626 m), weight 165 lb (74.8 kg), SpO2 100 %.  LABORATORY DATA: Lab Results  Component Value Date   WBC 5.0 10/28/2016   HGB 13.1 10/28/2016  HCT 41.0 10/28/2016   MCV 86.2 10/28/2016   PLT 231 10/28/2016      Chemistry      Component Value Date/Time   NA 139 10/28/2016 1211   K 4.2 10/28/2016 1211   CO2 25 10/28/2016 1211   BUN 21.0 10/28/2016 1211   CREATININE 0.8 10/28/2016 1211      Component Value Date/Time   CALCIUM 9.3  10/28/2016 1211   ALKPHOS 142 10/28/2016 1211   AST 14 10/28/2016 1211   ALT 11 10/28/2016 1211   BILITOT 0.59 10/28/2016 1211       RADIOGRAPHIC STUDIES: Ct Chest W Contrast  Result Date: 10/28/2016 CLINICAL DATA:  Restaging non-small cell lung cancer. Status post right upper lobe lobectomy in 2016. EXAM: CT CHEST WITH CONTRAST TECHNIQUE: Multidetector CT imaging of the chest was performed during intravenous contrast administration. CONTRAST:  48m ISOVUE-300 IOPAMIDOL (ISOVUE-300) INJECTION 61% COMPARISON:  Chest CT 04/29/2016 FINDINGS: Chest wall: No breast masses, supraclavicular or axillary lymphadenopathy. Small scattered lymph nodes are stable. Surgical changes from a thyroidectomy are noted. Cardiovascular: The heart is normal in size. No pericardial effusion. The aorta is normal in caliber. No dissection. Stable coronary artery calcifications. Mediastinum/Nodes: Small scattered mediastinal and hilar lymph nodes but no mass or adenopathy. The esophagus is grossly normal. Lungs/Pleura: Stable emphysematous changes. Stable surgical changes from a right upper lobe lobectomy. No findings for recurrent tumor or adenopathy. No worrisome pulmonary nodules. No pleural effusion. Upper Abdomen: No significant upper abdominal findings. Renal cysts are noted. No adrenal gland or hepatic metastasis. Musculoskeletal: No significant bony findings. IMPRESSION: Stable CT appearance of the chest. Stable emphysematous changes, pulmonary scarring and postoperative changes. No findings for recurrent tumor, adenopathy or metastatic disease. Electronically Signed   By: PMarijo SanesM.D.   On: 10/28/2016 16:01    ASSESSMENT AND PLAN: This is a very pleasant 65years old white female with history of stage IB non-small cell lung cancer status post right upper lobectomy with lymph node dissection followed by 4 cycles of adjuvant systemic chemotherapy with cisplatin and Alimta. The patient is currently on observation  and the recent CT scan of the chest showed no clear evidence for disease recurrence. I discussed the scan results with the patient today. I recommended for her to continue on observation with repeat CT scan of the chest in 6 months. She was advised to call immediately if she has any concerning symptoms in the interval. The patient voices understanding of current disease status and treatment options and is in agreement with the current care plan.  All questions were answered. The patient knows to call the clinic with any problems, questions or concerns. We can certainly see the patient much sooner if necessary.  Disclaimer: This note was dictated with voice recognition software. Similar sounding words can inadvertently be transcribed and may not be corrected upon review.

## 2016-11-09 NOTE — Telephone Encounter (Signed)
Appointments scheduled per 11/09/16 los. A copy of the  AVS report and appointment schedule was given to patient,per 11/09/16 los.

## 2016-12-03 ENCOUNTER — Encounter: Payer: Medicare Other | Admitting: Sports Medicine

## 2016-12-16 DIAGNOSIS — M9903 Segmental and somatic dysfunction of lumbar region: Secondary | ICD-10-CM | POA: Diagnosis not present

## 2016-12-16 DIAGNOSIS — M5417 Radiculopathy, lumbosacral region: Secondary | ICD-10-CM | POA: Diagnosis not present

## 2016-12-16 DIAGNOSIS — M531 Cervicobrachial syndrome: Secondary | ICD-10-CM | POA: Diagnosis not present

## 2016-12-16 DIAGNOSIS — M9901 Segmental and somatic dysfunction of cervical region: Secondary | ICD-10-CM | POA: Diagnosis not present

## 2016-12-17 DIAGNOSIS — M549 Dorsalgia, unspecified: Secondary | ICD-10-CM | POA: Diagnosis not present

## 2016-12-17 DIAGNOSIS — L301 Dyshidrosis [pompholyx]: Secondary | ICD-10-CM | POA: Diagnosis not present

## 2016-12-17 DIAGNOSIS — E039 Hypothyroidism, unspecified: Secondary | ICD-10-CM | POA: Diagnosis not present

## 2016-12-19 DIAGNOSIS — I10 Essential (primary) hypertension: Secondary | ICD-10-CM | POA: Diagnosis not present

## 2016-12-19 DIAGNOSIS — F419 Anxiety disorder, unspecified: Secondary | ICD-10-CM | POA: Diagnosis not present

## 2016-12-22 ENCOUNTER — Ambulatory Visit (INDEPENDENT_AMBULATORY_CARE_PROVIDER_SITE_OTHER): Payer: Self-pay | Admitting: Sports Medicine

## 2016-12-22 ENCOUNTER — Encounter: Payer: Self-pay | Admitting: Sports Medicine

## 2016-12-22 DIAGNOSIS — M79672 Pain in left foot: Secondary | ICD-10-CM

## 2016-12-22 DIAGNOSIS — M722 Plantar fascial fibromatosis: Secondary | ICD-10-CM

## 2016-12-22 DIAGNOSIS — M76821 Posterior tibial tendinitis, right leg: Secondary | ICD-10-CM

## 2016-12-22 DIAGNOSIS — M2142 Flat foot [pes planus] (acquired), left foot: Secondary | ICD-10-CM

## 2016-12-22 DIAGNOSIS — M76822 Posterior tibial tendinitis, left leg: Principal | ICD-10-CM

## 2016-12-22 DIAGNOSIS — E039 Hypothyroidism, unspecified: Secondary | ICD-10-CM | POA: Diagnosis not present

## 2016-12-22 DIAGNOSIS — K219 Gastro-esophageal reflux disease without esophagitis: Secondary | ICD-10-CM | POA: Diagnosis not present

## 2016-12-22 DIAGNOSIS — F4321 Adjustment disorder with depressed mood: Secondary | ICD-10-CM | POA: Diagnosis not present

## 2016-12-22 DIAGNOSIS — M2141 Flat foot [pes planus] (acquired), right foot: Secondary | ICD-10-CM

## 2016-12-22 DIAGNOSIS — M79671 Pain in right foot: Secondary | ICD-10-CM

## 2016-12-22 NOTE — Patient Instructions (Signed)

## 2016-12-22 NOTE — Progress Notes (Signed)
Patient discussed with medical assistant. One pair of Custom Functional orthotics were fitted and dispensed to patient with wear instructions and break in period explained. Patient to follow up as scheduled for continued care or sooner if problems or issues arise. -Dr. Mathius Birkeland  

## 2016-12-24 ENCOUNTER — Ambulatory Visit (INDEPENDENT_AMBULATORY_CARE_PROVIDER_SITE_OTHER): Payer: Medicare Other | Admitting: Sports Medicine

## 2016-12-24 ENCOUNTER — Encounter: Payer: Self-pay | Admitting: Sports Medicine

## 2016-12-24 DIAGNOSIS — B359 Dermatophytosis, unspecified: Secondary | ICD-10-CM | POA: Diagnosis not present

## 2016-12-24 DIAGNOSIS — L988 Other specified disorders of the skin and subcutaneous tissue: Secondary | ICD-10-CM | POA: Diagnosis not present

## 2016-12-24 DIAGNOSIS — M79675 Pain in left toe(s): Secondary | ICD-10-CM

## 2016-12-24 MED ORDER — CLOTRIMAZOLE 1 % EX SOLN
CUTANEOUS | 5 refills | Status: DC
Start: 2016-12-24 — End: 2017-03-16

## 2016-12-24 NOTE — Progress Notes (Signed)
Subjective: Victoria Pena is a 66 y.o. female patient who presents to office for evaluation of "blister" in between toes on left. 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 Reports that her orthotics feel wonderful and its helping her back. Patient denies any other pedal complaints.   Patient Active Problem List   Diagnosis Date Noted  . Adenocarcinoma of right lung, stage 1 ([facility]) 07/26/2016   Current Outpatient Prescriptions on File Prior to Visit  Medication Sig Dispense Refill  . Albuterol (VENTOLIN IN) Inhale into the lungs.    . ALPRAZolam (XANAX) 0.5 MG tablet Take 0.5 mg by mouth at bedtime as needed for anxiety.    Marland Kitchen aspirin 81 MG tablet Take 81 mg by mouth daily.    Marland Kitchen buPROPion (WELLBUTRIN XL) 150 MG 24 hr tablet Take 150 mg by mouth daily.    Marland Kitchen levothyroxine (SYNTHROID, LEVOTHROID) 125 MCG tablet Take 125 mcg by mouth daily before breakfast.    . meloxicam (MOBIC) 15 MG tablet Take 1 tablet (15 mg total) by mouth daily. 30 tablet 0  . pantoprazole (PROTONIX) 40 MG tablet Take 40 mg by mouth daily.     No current facility-administered medications on file prior to visit.    Allergies  Allergen Reactions  . Dextromethorphan Hbr Shortness Of Breath  . Keflex [Cephalexin] Shortness Of Breath    Hard time breathing  . Hydrocodone Other (See Comments) and Anxiety    Heart racing, hard to concentrate, face flushed  . Hydroxyzine Other (See Comments) and Swelling    Numbness in lips  . Prednisone & Diphenhydramine Other (See Comments)    Face flushed, heart racing  . Prednisone Other (See Comments)  . Avelox [Moxifloxacin Hcl In Nacl] Rash  . Moxifloxacin Rash     Objective:  General: Alert and oriented x3 in no acute distress  Dermatology: + Left 4th webspace maceration with mild swelling and blistered skin, no cellulitis, no odor, no active drainage, no ecchymosis bilateral, all  nails x 10 are well manicured.  Vascular: Dorsalis Pedis and Posterior Tibial pedal pulses 1/4, Capillary Fill Time 3 seconds, (+) pedal hair growth bilateral, + varicosities, Temperature gradient within normal limits.  Neurology: Gross sensation intact via light touch bilateral.   Musculoskeletal: Minimal tenderness with palpation left 4-5 toes. Mild left 5th varus rotated hammertoe.   Assessment and Plan: Problem List Items Addressed This Visit    None    Visit Diagnoses    Tinea    -  Primary   Relevant Medications   clotrimazole (LOTRIMIN) 1 % external solution   Skin maceration       Toe pain, left          -Complete examination performed -Discussed treatement options for interdigital tinea -Applied betadine to 4th interspace on left -Rx clotrimazole solution to use at bedtime and betadine during the day -Advised rest and comfortable shoes -Patient decline pain injection to left 5th toe -Continue with custom orthotics  -Patient to return to office as needed or sooner if condition worsens.  Landis Martins, DPM

## 2016-12-27 DIAGNOSIS — F418 Other specified anxiety disorders: Secondary | ICD-10-CM | POA: Insufficient documentation

## 2016-12-27 DIAGNOSIS — F419 Anxiety disorder, unspecified: Secondary | ICD-10-CM | POA: Insufficient documentation

## 2016-12-27 DIAGNOSIS — I10 Essential (primary) hypertension: Secondary | ICD-10-CM | POA: Insufficient documentation

## 2016-12-27 DIAGNOSIS — F32A Depression, unspecified: Secondary | ICD-10-CM | POA: Insufficient documentation

## 2016-12-27 DIAGNOSIS — F329 Major depressive disorder, single episode, unspecified: Secondary | ICD-10-CM | POA: Insufficient documentation

## 2016-12-27 DIAGNOSIS — R002 Palpitations: Secondary | ICD-10-CM | POA: Insufficient documentation

## 2016-12-27 DIAGNOSIS — E0500 Thyrotoxicosis with diffuse goiter without thyrotoxic crisis or storm: Secondary | ICD-10-CM | POA: Insufficient documentation

## 2016-12-27 HISTORY — DX: Essential (primary) hypertension: I10

## 2016-12-27 HISTORY — DX: Thyrotoxicosis with diffuse goiter without thyrotoxic crisis or storm: E05.00

## 2016-12-29 ENCOUNTER — Ambulatory Visit (INDEPENDENT_AMBULATORY_CARE_PROVIDER_SITE_OTHER): Payer: Self-pay | Admitting: Sports Medicine

## 2016-12-29 DIAGNOSIS — B359 Dermatophytosis, unspecified: Secondary | ICD-10-CM

## 2016-12-29 DIAGNOSIS — L988 Other specified disorders of the skin and subcutaneous tissue: Secondary | ICD-10-CM

## 2016-12-29 DIAGNOSIS — M79675 Pain in left toe(s): Secondary | ICD-10-CM

## 2016-12-29 NOTE — Progress Notes (Signed)
Subjective: Victoria Pena is a 66 y.o. female patient who returns to office for evaluation of sore spot in between 4-5 toes on left states that over the last 5 days has been doing everything as instructed and is worry that she may loose her toe because nothing has started to heal in between toes. Patient denies any other pedal complaints.   Patient Active Problem List   Diagnosis Date Noted  . Adenocarcinoma of right lung, stage 1 ([place]) 07/26/2016   Current Outpatient Prescriptions on File Prior to Visit  Medication Sig Dispense Refill  . Albuterol (VENTOLIN IN) Inhale into the lungs.    . ALPRAZolam (XANAX) 0.5 MG tablet Take 0.5 mg by mouth at bedtime as needed for anxiety.    Marland Kitchen aspirin 81 MG tablet Take 81 mg by mouth daily.    Marland Kitchen buPROPion (WELLBUTRIN XL) 150 MG 24 hr tablet Take 150 mg by mouth daily.    . clotrimazole (LOTRIMIN) 1 % external solution Daily at bedtime In between toes 60 mL 5  . levothyroxine (SYNTHROID, LEVOTHROID) 125 MCG tablet Take 125 mcg by mouth daily before breakfast.    . meloxicam (MOBIC) 15 MG tablet Take 1 tablet (15 mg total) by mouth daily. 30 tablet 0  . pantoprazole (PROTONIX) 40 MG tablet Take 40 mg by mouth daily.     No current facility-administered medications on file prior to visit.    Allergies  Allergen Reactions  . Dextromethorphan Hbr Shortness Of Breath  . Keflex [Cephalexin] Shortness Of Breath    Hard time breathing  . Hydrocodone Other (See Comments) and Anxiety    Heart racing, hard to concentrate, face flushed  . Hydroxyzine Other (See Comments) and Swelling    Numbness in lips  . Prednisone & Diphenhydramine Other (See Comments)    Face flushed, heart racing  . Prednisone Other (See Comments)  . Avelox [Moxifloxacin Hcl In Nacl] Rash  . Moxifloxacin Rash     Objective:  General: Alert and oriented x3 in no acute distress  Dermatology: + Left 4th webspace maceration with mild swelling and blistered skin with pinpoint  ulcer, no cellulitis, no odor, no active drainage, no ecchymosis bilateral, all nails x 10 are well manicured.  Vascular: Dorsalis Pedis and Posterior Tibial pedal pulses 1/4, Capillary Fill Time 3 seconds, (+) pedal hair growth bilateral, + varicosities, Temperature gradient within normal limits.  Neurology: Gross sensation intact via light touch bilateral.   Musculoskeletal: Minimal tenderness with palpation left 4-5 toes. Mild left 5th varus rotated hammertoe.   Assessment and Plan: Problem List Items Addressed This Visit    None    Visit Diagnoses    Tinea    -  Primary   Skin maceration       Toe pain, left         -Complete examination performed -Discussed treatement options for interdigital tinea -Applied gentician violet to 4th interspace on left -Continue with clotrimazole solution to use at bedtime and betadine during the day -Advised rest and comfortable shoes -Continue with custom orthotics  -Patient to return to office as needed or sooner if condition worsens.  Landis Martins, DPM

## 2017-01-12 ENCOUNTER — Ambulatory Visit (INDEPENDENT_AMBULATORY_CARE_PROVIDER_SITE_OTHER): Payer: Medicare Other

## 2017-01-12 ENCOUNTER — Encounter: Payer: Self-pay | Admitting: Sports Medicine

## 2017-01-12 ENCOUNTER — Ambulatory Visit (INDEPENDENT_AMBULATORY_CARE_PROVIDER_SITE_OTHER): Payer: Medicare Other | Admitting: Sports Medicine

## 2017-01-12 DIAGNOSIS — B359 Dermatophytosis, unspecified: Secondary | ICD-10-CM

## 2017-01-12 DIAGNOSIS — L988 Other specified disorders of the skin and subcutaneous tissue: Secondary | ICD-10-CM | POA: Diagnosis not present

## 2017-01-12 DIAGNOSIS — M79675 Pain in left toe(s): Secondary | ICD-10-CM | POA: Diagnosis not present

## 2017-01-12 DIAGNOSIS — L97521 Non-pressure chronic ulcer of other part of left foot limited to breakdown of skin: Secondary | ICD-10-CM

## 2017-01-12 MED ORDER — SULFAMETHOXAZOLE-TRIMETHOPRIM 800-160 MG PO TABS
1.0000 | ORAL_TABLET | Freq: Two times a day (BID) | ORAL | 0 refills | Status: DC
Start: 2017-01-12 — End: 2017-03-09

## 2017-01-12 NOTE — Progress Notes (Addendum)
Subjective: Victoria Pena is a 66 y.o. female patient who returns to office for evaluation of sore spot in between 4-5 toes on left states that over the last 2 weeks has been doing everything as instructed and is still hurts and feels like it is still not healing in between the toes. States that is painful, by the end of the day and sometimes puffy and red. Patient denies any other pedal complaints.   Patient Active Problem List   Diagnosis Date Noted  . Adenocarcinoma of right lung, stage 1 ([place]) 07/26/2016   Current Outpatient Prescriptions on File Prior to Visit  Medication Sig Dispense Refill  . Albuterol (VENTOLIN IN) Inhale into the lungs.    . ALPRAZolam (XANAX) 0.5 MG tablet Take 0.5 mg by mouth at bedtime as needed for anxiety.    Marland Kitchen aspirin 81 MG tablet Take 81 mg by mouth daily.    Marland Kitchen buPROPion (WELLBUTRIN XL) 150 MG 24 hr tablet Take 150 mg by mouth daily.    . clotrimazole (LOTRIMIN) 1 % external solution Daily at bedtime In between toes 60 mL 5  . levothyroxine (SYNTHROID, LEVOTHROID) 125 MCG tablet Take 125 mcg by mouth daily before breakfast.    . meloxicam (MOBIC) 15 MG tablet Take 1 tablet (15 mg total) by mouth daily. 30 tablet 0  . pantoprazole (PROTONIX) 40 MG tablet Take 40 mg by mouth daily.     No current facility-administered medications on file prior to visit.    Allergies  Allergen Reactions  . Dextromethorphan Hbr Shortness Of Breath  . Keflex [Cephalexin] Shortness Of Breath    Hard time breathing  . Hydrocodone Other (See Comments) and Anxiety    Heart racing, hard to concentrate, face flushed  . Hydroxyzine Other (See Comments) and Swelling    Numbness in lips  . Prednisone & Diphenhydramine Other (See Comments)    Face flushed, heart racing  . Prednisone Other (See Comments)  . Avelox [Moxifloxacin Hcl In Nacl] Rash  . Moxifloxacin Rash     Objective:  General: Alert and oriented x3 in no acute distress  Dermatology: Pinpoint ulceration noted  at the fourth webspace at the medial aspect of the fifth toe,+ Left 4th webspace maceration with mild swelling, no cellulitis, no odor, no active drainage, no ecchymosis bilateral, all nails x 10 are well manicured.  Vascular: Dorsalis Pedis and Posterior Tibial pedal pulses 1/4, Capillary Fill Time 3 seconds, (+) pedal hair growth bilateral, + varicosities, Temperature gradient within normal limits.  Neurology: Gross sensation intact via light touch bilateral.   Musculoskeletal: Minimal tenderness with palpation left 4-5 toes. Mild left 5th varus rotated hammertoe. New onset right heel pain at the insertion of the Achilles, likely secondary to compensation on right.  X-rays left foot consistent with hammertoe with varus rotation. No other acute findings  Assessment and Plan: Problem List Items Addressed This Visit    None    Visit Diagnoses    Skin maceration    -  Primary   Relevant Orders   DG Foot 2 Views Left   Toe ulcer, left, limited to breakdown of skin ([facility])       Relevant Medications   sulfamethoxazole-trimethoprim (BACTRIM DS,SEPTRA DS) 800-160 MG tablet   Other Relevant Orders   DG Foot 2 Views Left   Tinea       Relevant Medications   sulfamethoxazole-trimethoprim (BACTRIM DS,SEPTRA DS) 800-160 MG tablet   Other Relevant Orders   DG Foot 2 Views Left  Toe pain, left       Relevant Orders   DG Foot 2 Views Left     -Complete examination performed -X-rays reviewed -Discussed treatement options for interdigital tinea with ulceration and varus hammertoe -Applied Prisma wound dressing to interspace. Advised patient to do the same each day, secured with paper tape during the day and leave open to air at night. -Prescribed Bactrim for preventative measures. Even though no frank concern for gross infection. -Advised rest and comfortable shoes -Continue with custom orthotics and recommend heel lift underneath the right orthotic and to ice, right heel, likely heel pain is  from compensating for left foot pain -Advised patient that if toe ulceration is not better at next visit strongly recommend more aggressive approach of surgical where hammertoe is corrected and ulceration is excised and stitched closed. Patient expressed understanding and states that she will consider this when she comes back from visiting her daughter next month. -Patient to return to office in 1-2 weeks or sooner if condition worsens.  Landis Martins, DPM

## 2017-01-14 DIAGNOSIS — B354 Tinea corporis: Secondary | ICD-10-CM | POA: Diagnosis not present

## 2017-01-14 DIAGNOSIS — L299 Pruritus, unspecified: Secondary | ICD-10-CM | POA: Diagnosis not present

## 2017-01-28 ENCOUNTER — Ambulatory Visit (INDEPENDENT_AMBULATORY_CARE_PROVIDER_SITE_OTHER): Payer: Medicare Other | Admitting: Sports Medicine

## 2017-01-28 ENCOUNTER — Encounter: Payer: Self-pay | Admitting: Sports Medicine

## 2017-01-28 DIAGNOSIS — M2042 Other hammer toe(s) (acquired), left foot: Secondary | ICD-10-CM

## 2017-01-28 DIAGNOSIS — M79675 Pain in left toe(s): Secondary | ICD-10-CM

## 2017-01-28 DIAGNOSIS — L97521 Non-pressure chronic ulcer of other part of left foot limited to breakdown of skin: Secondary | ICD-10-CM

## 2017-01-28 DIAGNOSIS — L988 Other specified disorders of the skin and subcutaneous tissue: Secondary | ICD-10-CM

## 2017-01-28 DIAGNOSIS — B359 Dermatophytosis, unspecified: Secondary | ICD-10-CM

## 2017-01-28 NOTE — Patient Instructions (Signed)
Pre-Operative Instructions  Congratulations, you have decided to take an important step to improving your quality of life.  You can be assured that the doctors of [facility] will be with you every step of the way.  1. Plan to be at the surgery center/hospital at least 1 (one) hour prior to your scheduled time unless otherwise directed by the surgical center/hospital staff.  You must have a responsible adult accompany you, remain during the surgery and drive you home.  Make sure you have directions to the surgical center/hospital and know how to get there on time. 2. For hospital based surgery you will need to obtain a history and physical form from your family physician within 1 month prior to the date of surgery- we will give you a form for you primary physician.  3. We make every effort to accommodate the date you request for surgery.  There are however, times where surgery dates or times have to be moved.  We will contact you as soon as possible if a change in schedule is required.   4. No Aspirin/Ibuprofen for one week before surgery.  If you are on aspirin, any non-steroidal anti-inflammatory medications (Mobic, Aleve, Ibuprofen) you should stop taking it 7 days prior to your surgery.  You make take Tylenol  For pain prior to surgery.  5. Medications- If you are taking daily heart and blood pressure medications, seizure, reflux, allergy, asthma, anxiety, pain or diabetes medications, make sure the surgery center/hospital is aware before the day of surgery so they may notify you which medications to take or avoid the day of surgery. 6. No food or drink after midnight the night before surgery unless directed otherwise by surgical center/hospital staff. 7. No alcoholic beverages 24 hours prior to surgery.  No smoking 24 hours prior to or 24 hours after surgery. 8. Wear loose pants or shorts- loose enough to fit over bandages, boots, and casts. 9. No slip on shoes, sneakers are best. 10. Bring  your boot with you to the surgery center/hospital.  Also bring crutches or a walker if your physician has prescribed it for you.  If you do not have this equipment, it will be provided for you after surgery. 11. If you have not been contracted by the surgery center/hospital by the day before your surgery, call to confirm the date and time of your surgery. 12. Leave-time from work may vary depending on the type of surgery you have.  Appropriate arrangements should be made prior to surgery with your employer. 13. Prescriptions will be provided immediately following surgery by your doctor.  Have these filled as soon as possible after surgery and take the medication as directed. 14. Remove nail polish on the operative foot. 15. Wash the night before surgery.  The night before surgery wash the foot and leg well with the antibacterial soap provided and water paying special attention to beneath the toenails and in between the toes.  Rinse thoroughly with water and dry well with a towel.  Perform this wash unless told not to do so by your physician.  Enclosed: 1 Ice pack (please put in freezer the night before surgery)   1 Hibiclens skin cleaner   Pre-op Instructions  If you have any questions regarding the instructions, do not hesitate to call our office.  [place]: [address] [place], [place] [address] [phone]  [place]: [address] [place], [place] [address] [phone]  [place]: [address]  [place], [place] [address] [phone]   Dr.   Norman Regal DPM, Dr. Matthew Wagoner DPM, Dr. M. Todd Hyatt DPM, Dr. Shyra Emile DPM 

## 2017-01-28 NOTE — Progress Notes (Addendum)
Subjective: Victoria Pena is a 66 y.o. female patient who returns to office for evaluation of sore spot in between 4-5 toes on left states that it still hurts and feels like it is still not healing in between the toes; desires surgery. Reports that she is a small spot in between her right 4-5 toes now that she has been using betadine in the AM and Clotrimazole in the PM. Patient desires surgery for the left. Patient denies any other pedal complaints.   Patient Active Problem List   Diagnosis Date Noted  . Adenocarcinoma of right lung, stage 1 ([place]) 07/26/2016   Current Outpatient Prescriptions on File Prior to Visit  Medication Sig Dispense Refill  . Albuterol (VENTOLIN IN) Inhale into the lungs.    . ALPRAZolam (XANAX) 0.5 MG tablet Take 0.5 mg by mouth at bedtime as needed for anxiety.    Marland Kitchen aspirin 81 MG tablet Take 81 mg by mouth daily.    Marland Kitchen buPROPion (WELLBUTRIN XL) 150 MG 24 hr tablet Take 150 mg by mouth daily.    . clotrimazole (LOTRIMIN) 1 % external solution Daily at bedtime In between toes 60 mL 5  . levothyroxine (SYNTHROID, LEVOTHROID) 125 MCG tablet Take 125 mcg by mouth daily before breakfast.    . meloxicam (MOBIC) 15 MG tablet Take 1 tablet (15 mg total) by mouth daily. 30 tablet 0  . pantoprazole (PROTONIX) 40 MG tablet Take 40 mg by mouth daily.    Marland Kitchen sulfamethoxazole-trimethoprim (BACTRIM DS,SEPTRA DS) 800-160 MG tablet Take 1 tablet by mouth 2 (two) times daily. 20 tablet 0   No current facility-administered medications on file prior to visit.    Allergies  Allergen Reactions  . Dextromethorphan Hbr Shortness Of Breath  . Keflex [Cephalexin] Shortness Of Breath    Hard time breathing  . Hydrocodone Other (See Comments) and Anxiety    Heart racing, hard to concentrate, face flushed  . Hydroxyzine Other (See Comments) and Swelling    Numbness in lips  . Prednisone & Diphenhydramine Other (See Comments)    Face flushed, heart racing  . Prednisone Other (See  Comments)  . Avelox [Moxifloxacin Hcl In Nacl] Rash  . Moxifloxacin Rash   Past Surgical History:  Procedure Laterality Date  . APPENDECTOMY    . CHOLECYSTECTOMY    . THYROIDECTOMY     Family History  Problem Relation Age of Onset  . Cancer Mother   . Cancer Father   . Cancer Brother    Social History   Social History  . Marital status: Widowed    Spouse name: N/A  . Number of children: N/A  . Years of education: N/A   Social History Main Topics  . Smoking status: Former Smoker    Packs/day: 1.50    Years: 35.00    Quit date: 07/27/1999  . Smokeless tobacco: Never Used  . Alcohol use No  . Drug use: No  . Sexual activity: Not Asked   Other Topics Concern  . None   Social History Narrative  . None   Objective:  General: Alert and oriented x3 in no acute distress  Dermatology: Pinpoint ulceration noted at the fourth webspace at the medial aspect of the fifth toe,+ Left 4th webspace maceration with mild swelling, there is a dry scale in 4th webspace on right, no cellulitis, no odor, no active drainage, no ecchymosis bilateral, all nails x 10 are well manicured.  Vascular: Dorsalis Pedis and Posterior Tibial pedal pulses 1/4, Capillary Fill Time  3 seconds, (+) pedal hair growth bilateral, + varicosities, Temperature gradient within normal limits.  Neurology: Gross sensation intact via light touch bilateral.   Musculoskeletal: Minimal tenderness with palpation left 4-5 toes. No tenderness on right. Mild left>right 5th varus rotated hammertoe.   Assessment and Plan: Problem List Items Addressed This Visit    None    Visit Diagnoses    Skin maceration    -  Primary   Toe ulcer, left, limited to breakdown of skin ([place])       Tinea       Hammertoe of left foot       Toe pain, left         -Complete examination performed -Patient opt for surgical management. Consent obtained for left 5th hammertoe repair with excision and closure of toe ulceration at interspace.  Pre and Post op course explained. Risks, benefits, alternatives explained. No guarantees given or implied. Surgical booking slip submitted and provided patient with Surgical packet and info for [facility] surgical center -Dispensed surgical shoe to use post op -Advised continue with betadine and clotrimazole at right 4th webspace and advised patient that increased sweaty feet could be from hormones, job, excessive standing and walking, and socks -Patient to return to office after surgery or sooner if condition worsens.  Addendum:Patient came by office with H&P form and states that she desires also to have surgery on right 5th hammertoe with excision of ulceration that is now developing in the 4th webspace on that foot as well. I informed patient at time of surgery that we will add this to the consent. Another post-op shoe was dispensed to patient as well for right  Landis Martins, DPM

## 2017-02-01 DIAGNOSIS — E7800 Pure hypercholesterolemia, unspecified: Secondary | ICD-10-CM | POA: Diagnosis not present

## 2017-02-01 DIAGNOSIS — L97509 Non-pressure chronic ulcer of other part of unspecified foot with unspecified severity: Secondary | ICD-10-CM | POA: Diagnosis not present

## 2017-02-01 DIAGNOSIS — M2040 Other hammer toe(s) (acquired), unspecified foot: Secondary | ICD-10-CM | POA: Diagnosis not present

## 2017-02-01 DIAGNOSIS — Z01818 Encounter for other preprocedural examination: Secondary | ICD-10-CM | POA: Diagnosis not present

## 2017-02-07 ENCOUNTER — Telehealth: Payer: Self-pay | Admitting: *Deleted

## 2017-02-07 NOTE — Telephone Encounter (Signed)
I attempted to return patient's call.  I left her a message to call me back.

## 2017-02-07 NOTE — Telephone Encounter (Signed)
"  I go to see Dr. Cannon Kettle in [place].  She's going to do surgery on my Hammer Toe.  I had my physical done and had it faxed to you.  I still haven't heard anything about it being scheduled.  I need to let my work know.  Please give me a call."

## 2017-02-07 NOTE — Telephone Encounter (Signed)
"  Sorry I missed your call."  Yes, I was calling to get a date for your surgery.  Do you have a date in mind?  "I can do it Monday of next week."  She doesn't have anything available that day.  She can do it on March 5.  "I guess I'll do it then if that is the only thing she has available."  Someone from the surgical center will call you with the arrival time.

## 2017-02-21 ENCOUNTER — Encounter: Payer: Self-pay | Admitting: Sports Medicine

## 2017-02-21 DIAGNOSIS — M2041 Other hammer toe(s) (acquired), right foot: Secondary | ICD-10-CM | POA: Diagnosis not present

## 2017-02-21 DIAGNOSIS — Z7982 Long term (current) use of aspirin: Secondary | ICD-10-CM | POA: Diagnosis not present

## 2017-02-21 DIAGNOSIS — D492 Neoplasm of unspecified behavior of bone, soft tissue, and skin: Secondary | ICD-10-CM | POA: Diagnosis not present

## 2017-02-21 DIAGNOSIS — M2042 Other hammer toe(s) (acquired), left foot: Secondary | ICD-10-CM | POA: Diagnosis not present

## 2017-02-21 DIAGNOSIS — L84 Corns and callosities: Secondary | ICD-10-CM | POA: Diagnosis not present

## 2017-02-21 DIAGNOSIS — Z79899 Other long term (current) drug therapy: Secondary | ICD-10-CM | POA: Diagnosis not present

## 2017-02-21 DIAGNOSIS — D2372 Other benign neoplasm of skin of left lower limb, including hip: Secondary | ICD-10-CM | POA: Diagnosis not present

## 2017-02-21 DIAGNOSIS — L97521 Non-pressure chronic ulcer of other part of left foot limited to breakdown of skin: Secondary | ICD-10-CM | POA: Diagnosis not present

## 2017-02-21 DIAGNOSIS — E079 Disorder of thyroid, unspecified: Secondary | ICD-10-CM | POA: Diagnosis not present

## 2017-02-22 ENCOUNTER — Telehealth: Payer: Self-pay | Admitting: Sports Medicine

## 2017-02-22 NOTE — Telephone Encounter (Signed)
Post op phone call made to patient. Phone line was busy. Will attempt call back at a later time. -Dr. Cannon Kettle

## 2017-02-22 NOTE — Telephone Encounter (Signed)
Thanks

## 2017-02-22 NOTE — Telephone Encounter (Signed)
Post op courtesy call - Pt states she is resting and wearing the boot, states she has noticed the ice really does help with discomfort. I encouraged pt to RICE as much as possible, not to weight bear or have foot below the heart more than 15 minutes/hour, be report any pain, swelling or heat in calf(s) and call with any questions. Pt states understanding.

## 2017-03-02 ENCOUNTER — Encounter: Payer: Self-pay | Admitting: Sports Medicine

## 2017-03-02 ENCOUNTER — Ambulatory Visit (INDEPENDENT_AMBULATORY_CARE_PROVIDER_SITE_OTHER): Payer: Medicare Other

## 2017-03-02 ENCOUNTER — Ambulatory Visit (INDEPENDENT_AMBULATORY_CARE_PROVIDER_SITE_OTHER): Payer: Medicare Other | Admitting: Sports Medicine

## 2017-03-02 DIAGNOSIS — M2041 Other hammer toe(s) (acquired), right foot: Secondary | ICD-10-CM | POA: Diagnosis not present

## 2017-03-02 DIAGNOSIS — M79671 Pain in right foot: Secondary | ICD-10-CM

## 2017-03-02 DIAGNOSIS — M79672 Pain in left foot: Principal | ICD-10-CM

## 2017-03-02 DIAGNOSIS — M2042 Other hammer toe(s) (acquired), left foot: Secondary | ICD-10-CM | POA: Diagnosis not present

## 2017-03-02 DIAGNOSIS — L988 Other specified disorders of the skin and subcutaneous tissue: Secondary | ICD-10-CM

## 2017-03-02 DIAGNOSIS — Z9889 Other specified postprocedural states: Secondary | ICD-10-CM

## 2017-03-02 NOTE — Progress Notes (Signed)
Subjective: Victoria Pena is a 66 y.o. female patient seen today in office for POV #1 (DOS 02-21-17), S/P bilateral 5th hammertoe repair and excision of soft corn in interspace. Patient denies current pain at surgical site but states that she has gotten pain that wakes her up or a throb in the toes, denies calf pain, denies headache, chest pain, shortness of breath, nausea, vomiting, fever, or chills. Patient states that she is doing well and is only taking Ibuprofen and has about 8 pain pills left. No other issues noted.   Patient Active Problem List   Diagnosis Date Noted  . Anxiety 12/27/2016  . Benign essential hypertension 12/27/2016  . Depression 12/27/2016  . Graves disease 12/27/2016  . Palpitations 12/27/2016  . Adenocarcinoma of right lung, stage 1 ([place]) 07/26/2016    Current Outpatient Prescriptions on File Prior to Visit  Medication Sig Dispense Refill  . Albuterol (VENTOLIN IN) Inhale into the lungs.    . ALPRAZolam (XANAX) 0.5 MG tablet Take 0.5 mg by mouth at bedtime as needed for anxiety.    Marland Kitchen aspirin 81 MG tablet Take 81 mg by mouth daily.    Marland Kitchen buPROPion (WELLBUTRIN XL) 150 MG 24 hr tablet Take 150 mg by mouth daily.    . clotrimazole (LOTRIMIN) 1 % external solution Daily at bedtime In between toes 60 mL 5  . levothyroxine (SYNTHROID, LEVOTHROID) 125 MCG tablet Take 125 mcg by mouth daily before breakfast.    . meloxicam (MOBIC) 15 MG tablet Take 1 tablet (15 mg total) by mouth daily. 30 tablet 0  . pantoprazole (PROTONIX) 40 MG tablet Take 40 mg by mouth daily.    Marland Kitchen sulfamethoxazole-trimethoprim (BACTRIM DS,SEPTRA DS) 800-160 MG tablet Take 1 tablet by mouth 2 (two) times daily. 20 tablet 0   No current facility-administered medications on file prior to visit.     Allergies  Allergen Reactions  . Dextromethorphan Hbr Shortness Of Breath  . Keflex [Cephalexin] Shortness Of Breath    Hard time breathing  . Hydrocodone Other (See Comments) and Anxiety    Heart  racing, hard to concentrate, face flushed  . Hydroxyzine Other (See Comments) and Swelling    Numbness in lips  . Prednisone & Diphenhydramine Other (See Comments)    Face flushed, heart racing  . Prednisone Other (See Comments)  . Avelox [Moxifloxacin Hcl In Nacl] Rash  . Moxifloxacin Rash    Objective: There were no vitals filed for this visit.  General: No acute distress, AAOx3  Right and Left foot: Sutures intact with no gapping or dehiscence at surgical site at dorsal 5th toes bilateral however at interspace there is maceration, mild swelling to right and left 5th toes, no erythema, no warmth, no drainage, no signs of infection noted, Capillary fill time <3 seconds in all digits, gross sensation present via light touch to right and left foot. No pain or crepitation with range of motion right/left foot.  No pain with calf compression.   Post Op Xray, Right and Left foot: Arthroplasty 5th toes with no acute findings. Soft tissue swelling within normal limits for post op status.   Assessment and Plan:  Problem List Items Addressed This Visit    None    Visit Diagnoses    S/P bilateral foot surgery    -  Primary   Pain in both feet       Relevant Orders   DG Foot Complete Left   DG Foot Complete Right   Skin maceration           -  Patient seen and evaluated -Xrays reviewed -Applied dry sterile dressing to surgical site right and left foot secured with ACE wrap and stockinet  -Advised patient to make sure to keep dressings clean, dry, and intact to left and right surgical sites, removing the ACE as needed  -Advised patient to continue with post-op shoes -Continue with meds and PRN meds as Rx  -Advised patient to limit activity to necessity  -Advised patient to ice and elevate as necessary  -Continue with no work -Will plan for suture removal at next office visit if ready at tops of 5th toes and advised patient that sutures in between the toes will have to stay in longer  because of maceration. In the meantime, patient to call office if any issues or problems arise.   Landis Martins, DPM

## 2017-03-09 ENCOUNTER — Ambulatory Visit (INDEPENDENT_AMBULATORY_CARE_PROVIDER_SITE_OTHER): Payer: Self-pay | Admitting: Sports Medicine

## 2017-03-09 ENCOUNTER — Encounter: Payer: Self-pay | Admitting: Sports Medicine

## 2017-03-09 DIAGNOSIS — Z9889 Other specified postprocedural states: Secondary | ICD-10-CM

## 2017-03-09 DIAGNOSIS — M79672 Pain in left foot: Secondary | ICD-10-CM

## 2017-03-09 DIAGNOSIS — M79671 Pain in right foot: Secondary | ICD-10-CM

## 2017-03-09 DIAGNOSIS — L988 Other specified disorders of the skin and subcutaneous tissue: Secondary | ICD-10-CM

## 2017-03-09 DIAGNOSIS — L97521 Non-pressure chronic ulcer of other part of left foot limited to breakdown of skin: Secondary | ICD-10-CM

## 2017-03-09 MED ORDER — SULFAMETHOXAZOLE-TRIMETHOPRIM 800-160 MG PO TABS: 1.0000 | ORAL_TABLET | Freq: Two times a day (BID) | ORAL | 0 refills | Status: DC

## 2017-03-09 MED ORDER — OXYCODONE-ACETAMINOPHEN 5-325 MG PO TABS: 1.0000 | ORAL_TABLET | Freq: Four times a day (QID) | ORAL | 0 refills | Status: DC | PRN

## 2017-03-09 NOTE — Progress Notes (Signed)
Subjective: Kadeshia Kasparian is a 66 y.o. female patient seen today in office for POV #2 (DOS 02-21-17), S/P bilateral 5th hammertoe repair and excision of soft corn in interspace. Patient denies current pain at surgical site but states that she has gotten pain that wakes her up or a throb in the toes requiring a pain pill at night, denies calf pain, denies headache, chest pain, shortness of breath, nausea, vomiting, fever, or chills. No other issues noted.   Patient Active Problem List   Diagnosis Date Noted  . Anxiety 12/27/2016  . Benign essential hypertension 12/27/2016  . Depression 12/27/2016  . Graves disease 12/27/2016  . Palpitations 12/27/2016  . Adenocarcinoma of right lung, stage 1 ([place]) 07/26/2016    Current Outpatient Prescriptions on File Prior to Visit  Medication Sig Dispense Refill  . Albuterol (VENTOLIN IN) Inhale into the lungs.    . ALPRAZolam (XANAX) 0.5 MG tablet Take 0.5 mg by mouth at bedtime as needed for anxiety.    Marland Kitchen aspirin 81 MG tablet Take 81 mg by mouth daily.    Marland Kitchen buPROPion (WELLBUTRIN XL) 150 MG 24 hr tablet Take 150 mg by mouth daily.    . clotrimazole (LOTRIMIN) 1 % external solution Daily at bedtime In between toes 60 mL 5  . levothyroxine (SYNTHROID, LEVOTHROID) 125 MCG tablet Take 125 mcg by mouth daily before breakfast.    . meloxicam (MOBIC) 15 MG tablet Take 1 tablet (15 mg total) by mouth daily. 30 tablet 0  . pantoprazole (PROTONIX) 40 MG tablet Take 40 mg by mouth daily.     No current facility-administered medications on file prior to visit.     Allergies  Allergen Reactions  . Dextromethorphan Hbr Shortness Of Breath  . Keflex [Cephalexin] Shortness Of Breath    Hard time breathing  . Hydrocodone Other (See Comments) and Anxiety    Heart racing, hard to concentrate, face flushed  . Hydroxyzine Other (See Comments) and Swelling    Numbness in lips  . Prednisone & Diphenhydramine Other (See Comments)    Face flushed, heart racing   . Prednisone Other (See Comments)  . Avelox [Moxifloxacin Hcl In Nacl] Rash  . Moxifloxacin Rash    Objective: There were no vitals filed for this visit.  General: No acute distress, AAOx3  Right and Left foot: Sutures intact with no gapping or dehiscence at surgical site at dorsal 5th toes bilateral however at interspace there is maceration worse left over right , mild swelling to right and left 5th toes, no erythema, no warmth, no drainage, no signs of infection noted, Capillary fill time <3 seconds in all digits, gross sensation present via light touch to right and left foot. No pain or crepitation with range of motion right/left foot.  No pain with calf compression.    Assessment and Plan:  Problem List Items Addressed This Visit    None    Visit Diagnoses    Pain in both feet    -  Primary   Relevant Medications   oxyCODONE-acetaminophen (ROXICET) 5-325 MG tablet   Skin maceration       Toe ulcer, left, limited to breakdown of skin (HCC)       Relevant Medications   sulfamethoxazole-trimethoprim (BACTRIM DS,SEPTRA DS) 800-160 MG tablet   S/P bilateral foot surgery       Relevant Medications   oxyCODONE-acetaminophen (ROXICET) 5-325 MG tablet      -Patient seen and evaluated -Dorsal sutures removed. Webspace sutures left if place.  Applied silver nitrate to webspaces and dry sterile dressing to surgical site right and left foot secured with ACE wrap and stockinet  -Advised patient to make sure to keep dressings clean, dry, and intact to left and right surgical sites, removing the ACE as needed  -Advised patient to continue with post-op shoes -Continue with meds and PRN meds as Rx; refilled percocet and Rx bactrim since patient continue with maceration especially on left with history of ulceration   -Advised patient to limit activity to necessity  -Advised patient to ice and elevate as necessary  -Continue with no work -Will plan for finishing suture removal at next office  visit if ready at tops of 5th toes and advised patient that sutures in between the toes will have to stay in longer because of maceration. In the meantime, patient to call office if any issues or problems arise.   Landis Martins, DPM

## 2017-03-10 ENCOUNTER — Other Ambulatory Visit: Payer: Self-pay | Admitting: Sports Medicine

## 2017-03-10 MED ORDER — ERYTHROMYCIN BASE 500 MG PO TABS: 500.0000 mg | ORAL_TABLET | Freq: Two times a day (BID) | ORAL | 0 refills | Status: DC

## 2017-03-10 NOTE — Progress Notes (Signed)
Changed bactrim to erythromycin to see if we can cover more possible bacteria that can be causing maceration in between toes. I called patient and informed her to pick up this antibiotic from the pharmacy.  -Dr. Cannon Kettle

## 2017-03-11 ENCOUNTER — Telehealth: Payer: Self-pay | Admitting: *Deleted

## 2017-03-11 NOTE — Telephone Encounter (Signed)
Yes switching back to bactrim is fine

## 2017-03-11 NOTE — Telephone Encounter (Signed)
Patient called and states that Dr. Cannon Kettle changed her antibiotic to Erythromycin from Bactrim and its making her sick. Says she is nauseated, has a headache. Requesting to switch back to Bactrim if she could.

## 2017-03-14 NOTE — Telephone Encounter (Signed)
Left message informing pt that Dr. Cannon Kettle stated pt could switch back to Bactrim DS and complete.

## 2017-03-14 NOTE — Telephone Encounter (Signed)
Victoria Pena- if you could let pt know! Thanks!

## 2017-03-16 ENCOUNTER — Encounter: Payer: Self-pay | Admitting: Sports Medicine

## 2017-03-16 ENCOUNTER — Ambulatory Visit (INDEPENDENT_AMBULATORY_CARE_PROVIDER_SITE_OTHER): Payer: Medicare Other | Admitting: Sports Medicine

## 2017-03-16 DIAGNOSIS — L97521 Non-pressure chronic ulcer of other part of left foot limited to breakdown of skin: Secondary | ICD-10-CM

## 2017-03-16 DIAGNOSIS — E039 Hypothyroidism, unspecified: Secondary | ICD-10-CM | POA: Diagnosis not present

## 2017-03-16 DIAGNOSIS — L988 Other specified disorders of the skin and subcutaneous tissue: Secondary | ICD-10-CM

## 2017-03-16 DIAGNOSIS — E7800 Pure hypercholesterolemia, unspecified: Secondary | ICD-10-CM | POA: Diagnosis not present

## 2017-03-16 DIAGNOSIS — M79672 Pain in left foot: Secondary | ICD-10-CM

## 2017-03-16 DIAGNOSIS — M79671 Pain in right foot: Secondary | ICD-10-CM

## 2017-03-16 DIAGNOSIS — Z9889 Other specified postprocedural states: Secondary | ICD-10-CM

## 2017-03-16 NOTE — Progress Notes (Signed)
Subjective: Victoria Pena is a 66 y.o. female patient seen today in office for POV #3 (DOS 02-21-17), S/P bilateral 5th hammertoe repair and excision of soft corn in interspace. Patient denies current pain at surgical site but states that the left toe is sore and is getting better, denies calf pain, denies headache, chest pain, shortness of breath, nausea, vomiting, fever, or chills. No other issues noted.   Reports switched back to bactrim antibiotic since the Erythromycin was making her feel sick.   Patient Active Problem List   Diagnosis Date Noted  . Anxiety 12/27/2016  . Benign essential hypertension 12/27/2016  . Depression 12/27/2016  . Graves disease 12/27/2016  . Palpitations 12/27/2016  . Adenocarcinoma of right lung, stage 1 ([place]) 07/26/2016    Current Outpatient Prescriptions on File Prior to Visit  Medication Sig Dispense Refill  . Albuterol (VENTOLIN IN) Inhale into the lungs.    . ALPRAZolam (XANAX) 0.5 MG tablet Take 0.5 mg by mouth at bedtime as needed for anxiety.    Marland Kitchen aspirin 81 MG tablet Take 81 mg by mouth daily.    Marland Kitchen buPROPion (WELLBUTRIN XL) 150 MG 24 hr tablet Take 150 mg by mouth daily.    Marland Kitchen levothyroxine (SYNTHROID, LEVOTHROID) 125 MCG tablet Take 125 mcg by mouth daily before breakfast.    . oxyCODONE-acetaminophen (ROXICET) 5-325 MG tablet Take 1 tablet by mouth every 6 (six) hours as needed for severe pain. 21 tablet 0  . pantoprazole (PROTONIX) 40 MG tablet Take 40 mg by mouth daily.    Marland Kitchen sulfamethoxazole-trimethoprim (BACTRIM DS,SEPTRA DS) 800-160 MG tablet Take 1 tablet by mouth 2 (two) times daily. 20 tablet 0   No current facility-administered medications on file prior to visit.     Allergies  Allergen Reactions  . Dextromethorphan Hbr Shortness Of Breath  . Keflex [Cephalexin] Shortness Of Breath    Hard time breathing  . Hydrocodone Other (See Comments) and Anxiety    Heart racing, hard to concentrate, face flushed  . Hydroxyzine Other  (See Comments) and Swelling    Numbness in lips  . Prednisone & Diphenhydramine Other (See Comments)    Face flushed, heart racing  . Prednisone Other (See Comments)  . Avelox [Moxifloxacin Hcl In Nacl] Rash  . Moxifloxacin Rash    Objective: There were no vitals filed for this visit.  General: No acute distress, AAOx3  Right and Left foot: Sutures intact with no gapping or dehiscence at surgical site at dorsal 5th toes bilateral however at interspace there is maceration worse left over right that is much better compared to last week, mild swelling to right and left 5th toes, no erythema, no warmth, no drainage, no signs of infection noted, Capillary fill time <3 seconds in all digits, gross sensation present via light touch to right and left foot. No pain or crepitation with range of motion right/left foot.  No pain with calf compression.    Assessment and Plan:  Problem List Items Addressed This Visit    None    Visit Diagnoses    Skin maceration    -  Primary   S/P bilateral foot surgery       Toe ulcer, left, limited to breakdown of skin (HCC)       Pain in both feet          -Patient seen and evaluated -Webspace sutures on right removed. Webspace sutures left if place on left. Applied silver nitrate to webspaces and dry sterile dressing to  surgical site right and left foot secured with ACE wrap and stockinet  -Advised patient to make sure to keep dressings clean, dry, and intact to left and right surgical sites, removing the ACE as needed  -Advised patient to continue with post-op shoes -Continue with meds and PRN meds; percocet and continue bactrim since patient continue with maceration especially on left with history of ulceration; Black light test was negative.  -Advised patient to limit activity to necessity  -Advised patient to ice and elevate as necessary  -Continue with no work until sutures are removed and she can tolerate normal shoes  -Will plan for finishing suture  removal at next office visit. In the meantime, patient to call office if any issues or problems arise.   Landis Martins, DPM

## 2017-03-21 DIAGNOSIS — I10 Essential (primary) hypertension: Secondary | ICD-10-CM | POA: Diagnosis not present

## 2017-03-21 DIAGNOSIS — E039 Hypothyroidism, unspecified: Secondary | ICD-10-CM | POA: Diagnosis not present

## 2017-03-21 DIAGNOSIS — K219 Gastro-esophageal reflux disease without esophagitis: Secondary | ICD-10-CM | POA: Diagnosis not present

## 2017-03-21 DIAGNOSIS — F4321 Adjustment disorder with depressed mood: Secondary | ICD-10-CM | POA: Diagnosis not present

## 2017-03-24 ENCOUNTER — Ambulatory Visit (INDEPENDENT_AMBULATORY_CARE_PROVIDER_SITE_OTHER): Payer: Self-pay | Admitting: Sports Medicine

## 2017-03-24 ENCOUNTER — Encounter: Payer: Self-pay | Admitting: Sports Medicine

## 2017-03-24 DIAGNOSIS — M79672 Pain in left foot: Secondary | ICD-10-CM

## 2017-03-24 DIAGNOSIS — L97521 Non-pressure chronic ulcer of other part of left foot limited to breakdown of skin: Secondary | ICD-10-CM

## 2017-03-24 DIAGNOSIS — Z9889 Other specified postprocedural states: Secondary | ICD-10-CM

## 2017-03-24 DIAGNOSIS — M79671 Pain in right foot: Secondary | ICD-10-CM

## 2017-03-24 DIAGNOSIS — L988 Other specified disorders of the skin and subcutaneous tissue: Secondary | ICD-10-CM

## 2017-03-24 NOTE — Progress Notes (Signed)
Subjective: Victoria Pena is a 66 y.o. female patient seen today in office for POV #4 (DOS 02-21-17), S/P bilateral 5th hammertoe repair and excision of soft corn in interspace. Patient denies current pain at surgical site but states that the left toe is sore and is getting better, denies calf pain, denies headache, chest pain, shortness of breath, nausea, vomiting, fever, or chills. No other issues noted.   Reports completed bactrim.   Patient Active Problem List   Diagnosis Date Noted  . Anxiety 12/27/2016  . Benign essential hypertension 12/27/2016  . Depression 12/27/2016  . Graves disease 12/27/2016  . Palpitations 12/27/2016  . Adenocarcinoma of right lung, stage 1 ([place]) 07/26/2016    Current Outpatient Prescriptions on File Prior to Visit  Medication Sig Dispense Refill  . Albuterol (VENTOLIN IN) Inhale into the lungs.    . ALPRAZolam (XANAX) 0.5 MG tablet Take 0.5 mg by mouth at bedtime as needed for anxiety.    Marland Kitchen aspirin 81 MG tablet Take 81 mg by mouth daily.    Marland Kitchen buPROPion (WELLBUTRIN XL) 150 MG 24 hr tablet Take 150 mg by mouth daily.    Marland Kitchen levothyroxine (SYNTHROID, LEVOTHROID) 125 MCG tablet Take 125 mcg by mouth daily before breakfast.    . losartan (COZAAR) 50 MG tablet Take 50 mg by mouth daily.  0  . oxyCODONE-acetaminophen (ROXICET) 5-325 MG tablet Take 1 tablet by mouth every 6 (six) hours as needed for severe pain. 21 tablet 0  . pantoprazole (PROTONIX) 40 MG tablet Take 40 mg by mouth daily.    Marland Kitchen sulfamethoxazole-trimethoprim (BACTRIM DS,SEPTRA DS) 800-160 MG tablet Take 1 tablet by mouth 2 (two) times daily. 20 tablet 0   No current facility-administered medications on file prior to visit.     Allergies  Allergen Reactions  . Dextromethorphan Hbr Shortness Of Breath  . Keflex [Cephalexin] Shortness Of Breath    Hard time breathing  . Hydrocodone Other (See Comments) and Anxiety    Heart racing, hard to concentrate, face flushed  . Hydroxyzine Other (See  Comments) and Swelling    Numbness in lips  . Prednisone & Diphenhydramine Other (See Comments)    Face flushed, heart racing  . Prednisone Other (See Comments)  . Avelox [Moxifloxacin Hcl In Nacl] Rash  . Moxifloxacin Rash    Objective: There were no vitals filed for this visit.  General: No acute distress, AAOx3  Right and Left foot: Sutures intact with no gapping or dehiscence at surgical site at 4th webspace on left, maceration improved, mild swelling to right and left 5th toes, no erythema, no warmth, no drainage, no signs of infection noted, Capillary fill time <3 seconds in all digits, gross sensation present via light touch to right and left foot. No pain or crepitation with range of motion right/left foot.  No pain with calf compression.    Assessment and Plan:  Problem List Items Addressed This Visit    None    Visit Diagnoses    Skin maceration    -  Primary   L>R 4th webspace   S/P bilateral foot surgery       Toe ulcer, left, limited to breakdown of skin (HCC)       Pain in both feet          -Patient seen and evaluated -Webspace sutures removed on left. Applied silver nitrate -Patient may shower on tomorrow -Dry well in between toes daily and [place] open to air -Wean from post op shoes  to normal shoes as swelling allows -Continue with meds and PRN meds -Advised patient to limit activity to necessity  -Advised patient to ice and elevate as necessary  -Continue with no work until next office visit -Will plan for increasing activities and planningreturn to work at next office visit. In the meantime, patient to call office if any issues or problems arise.   Landis Martins, DPM

## 2017-04-07 ENCOUNTER — Encounter: Payer: Medicare Other | Admitting: Sports Medicine

## 2017-04-15 ENCOUNTER — Encounter: Payer: Self-pay | Admitting: Sports Medicine

## 2017-04-15 ENCOUNTER — Ambulatory Visit (INDEPENDENT_AMBULATORY_CARE_PROVIDER_SITE_OTHER): Payer: Self-pay | Admitting: Sports Medicine

## 2017-04-15 ENCOUNTER — Ambulatory Visit (INDEPENDENT_AMBULATORY_CARE_PROVIDER_SITE_OTHER): Payer: Medicare Other

## 2017-04-15 DIAGNOSIS — M2041 Other hammer toe(s) (acquired), right foot: Secondary | ICD-10-CM

## 2017-04-15 DIAGNOSIS — M79671 Pain in right foot: Secondary | ICD-10-CM

## 2017-04-15 DIAGNOSIS — M2042 Other hammer toe(s) (acquired), left foot: Secondary | ICD-10-CM | POA: Diagnosis not present

## 2017-04-15 DIAGNOSIS — Z9889 Other specified postprocedural states: Secondary | ICD-10-CM

## 2017-04-15 DIAGNOSIS — M79672 Pain in left foot: Secondary | ICD-10-CM

## 2017-04-15 NOTE — Progress Notes (Signed)
Subjective: Victoria Pena is a 66 y.o. female patient seen today in office for POV #5 (DOS 02-21-17), S/P bilateral 5th hammertoe repair and excision of soft corn in interspace. Patient denies current pain at surgical site but states that the Right toe is sore and the left toe is doing ok, denies calf pain, denies headache, chest pain, shortness of breath, nausea, vomiting, fever, or chills. No other issues noted.   Patient Active Problem List   Diagnosis Date Noted  . Anxiety 12/27/2016  . Benign essential hypertension 12/27/2016  . Depression 12/27/2016  . Graves disease 12/27/2016  . Palpitations 12/27/2016  . Adenocarcinoma of right lung, stage 1 ([place]) 07/26/2016    Current Outpatient Prescriptions on File Prior to Visit  Medication Sig Dispense Refill  . Albuterol (VENTOLIN IN) Inhale into the lungs.    . ALPRAZolam (XANAX) 0.5 MG tablet Take 0.5 mg by mouth at bedtime as needed for anxiety.    Marland Kitchen aspirin 81 MG tablet Take 81 mg by mouth daily.    Marland Kitchen buPROPion (WELLBUTRIN XL) 150 MG 24 hr tablet Take 150 mg by mouth daily.    Marland Kitchen levothyroxine (SYNTHROID, LEVOTHROID) 125 MCG tablet Take 125 mcg by mouth daily before breakfast.    . losartan (COZAAR) 50 MG tablet Take 50 mg by mouth daily.  0  . oxyCODONE-acetaminophen (ROXICET) 5-325 MG tablet Take 1 tablet by mouth every 6 (six) hours as needed for severe pain. 21 tablet 0  . pantoprazole (PROTONIX) 40 MG tablet Take 40 mg by mouth daily.    Marland Kitchen sulfamethoxazole-trimethoprim (BACTRIM DS,SEPTRA DS) 800-160 MG tablet Take 1 tablet by mouth 2 (two) times daily. 20 tablet 0   No current facility-administered medications on file prior to visit.     Allergies  Allergen Reactions  . Dextromethorphan Hbr Shortness Of Breath  . Keflex [Cephalexin] Shortness Of Breath    Hard time breathing  . Hydrocodone Other (See Comments) and Anxiety    Heart racing, hard to concentrate, face flushed  . Hydroxyzine Other (See Comments) and Swelling     Numbness in lips  . Prednisone & Diphenhydramine Other (See Comments)    Face flushed, heart racing  . Prednisone Other (See Comments)  . Avelox [Moxifloxacin Hcl In Nacl] Rash  . Moxifloxacin Rash    Objective: There were no vitals filed for this visit.  General: No acute distress, AAOx3  Right and Left foot: Incisions well healed. Maceration resolved, no erythema, no warmth, no drainage, no signs of infection noted, Capillary fill time <3 seconds in all digits, gross sensation present via light touch to right and left foot. No pain or crepitation with range of motion right/left foot. Subjective pain at end of day at toes with more swelling. No pain with calf compression.    Assessment and Plan:  Problem List Items Addressed This Visit    None    Visit Diagnoses    S/P bilateral foot surgery    -  Primary   Relevant Orders   DG Foot Complete Right   DG Foot Complete Left   Pain in both feet          -Patient seen and evaluated -Xrays reviewed with no acute findings, consistent with post op status -Dispensed toe protectors and recommend coban wrap to toes to assist with edema control -Applied padding to orthotics  -Return to work with no restrictions  -Will plan for final POV/check at next office visit. In the meantime, patient to call office  if any issues or problems arise.   Landis Martins, DPM

## 2017-05-04 ENCOUNTER — Telehealth: Payer: Self-pay | Admitting: *Deleted

## 2017-05-04 NOTE — Telephone Encounter (Signed)
FYI "I received a call and must go out if town to [place].  I will not be able to have lab or scans tomorrow.  Reschedule the F/U appointment with Dr. Julien Nordmann also.  I expect to be in [place] for a week."  Provided [facility] Scheduling number to re-schedule scans.  Scheduling message sent to reschedule lab and F/U.   Scans are now 05-13-2017 at 10:30 am.

## 2017-05-05 ENCOUNTER — Ambulatory Visit (HOSPITAL_COMMUNITY): Payer: Medicare Other

## 2017-05-05 ENCOUNTER — Other Ambulatory Visit: Payer: Medicare Other

## 2017-05-10 ENCOUNTER — Ambulatory Visit: Payer: Medicare Other | Admitting: Internal Medicine

## 2017-05-13 ENCOUNTER — Other Ambulatory Visit: Payer: Medicare Other

## 2017-05-13 ENCOUNTER — Telehealth: Payer: Self-pay | Admitting: Internal Medicine

## 2017-05-13 ENCOUNTER — Ambulatory Visit (HOSPITAL_COMMUNITY): Payer: Medicare Other

## 2017-05-13 NOTE — Telephone Encounter (Signed)
Called to confirm appt change per sch message labs on 6/5  Prior to CT - patient aware.

## 2017-05-18 DIAGNOSIS — R300 Dysuria: Secondary | ICD-10-CM | POA: Diagnosis not present

## 2017-05-18 DIAGNOSIS — N390 Urinary tract infection, site not specified: Secondary | ICD-10-CM | POA: Diagnosis not present

## 2017-05-18 DIAGNOSIS — E039 Hypothyroidism, unspecified: Secondary | ICD-10-CM | POA: Diagnosis not present

## 2017-05-20 ENCOUNTER — Encounter: Payer: Medicare Other | Admitting: Sports Medicine

## 2017-05-24 ENCOUNTER — Ambulatory Visit (HOSPITAL_COMMUNITY)
Admission: RE | Admit: 2017-05-24 | Discharge: 2017-05-24 | Disposition: A | Discharge status: Home / Self Care | Payer: Medicare Other | Source: Ambulatory Visit | Attending: Internal Medicine | Admitting: Internal Medicine

## 2017-05-24 ENCOUNTER — Other Ambulatory Visit (HOSPITAL_BASED_OUTPATIENT_CLINIC_OR_DEPARTMENT_OTHER): Payer: Medicare Other

## 2017-05-24 ENCOUNTER — Encounter (HOSPITAL_COMMUNITY): Payer: Self-pay

## 2017-05-24 DIAGNOSIS — I700 Atherosclerosis of aorta: Secondary | ICD-10-CM | POA: Insufficient documentation

## 2017-05-24 DIAGNOSIS — C3491 Malignant neoplasm of unspecified part of right bronchus or lung: Secondary | ICD-10-CM | POA: Insufficient documentation

## 2017-05-24 DIAGNOSIS — J439 Emphysema, unspecified: Secondary | ICD-10-CM | POA: Diagnosis not present

## 2017-05-24 DIAGNOSIS — I2510 Atherosclerotic heart disease of native coronary artery without angina pectoris: Secondary | ICD-10-CM | POA: Diagnosis not present

## 2017-05-24 DIAGNOSIS — C3411 Malignant neoplasm of upper lobe, right bronchus or lung: Secondary | ICD-10-CM

## 2017-05-24 LAB — CBC WITH DIFFERENTIAL/PLATELET
BASO%: 0.8 % (ref 0.0–2.0)
BASOS ABS: 0 10*3/uL (ref 0.0–0.1)
EOS%: 2.2 % (ref 0.0–7.0)
Eosinophils Absolute: 0.1 10*3/uL (ref 0.0–0.5)
HEMATOCRIT: 38.8 % (ref 34.8–46.6)
HEMOGLOBIN: 12.7 g/dL (ref 11.6–15.9)
LYMPH#: 1.2 10*3/uL (ref 0.9–3.3)
LYMPH%: 25 % (ref 14.0–49.7)
MCH: 28.5 pg (ref 25.1–34.0)
MCHC: 32.8 g/dL (ref 31.5–36.0)
MCV: 87 fL (ref 79.5–101.0)
MONO#: 0.4 10*3/uL (ref 0.1–0.9)
MONO%: 9.1 % (ref 0.0–14.0)
NEUT%: 62.9 % (ref 38.4–76.8)
NEUTROS ABS: 3.1 10*3/uL (ref 1.5–6.5)
Platelets: 242 10*3/uL (ref 145–400)
RBC: 4.46 10*6/uL (ref 3.70–5.45)
RDW: 14.2 % (ref 11.2–14.5)
WBC: 4.9 10*3/uL (ref 3.9–10.3)

## 2017-05-24 LAB — COMPREHENSIVE METABOLIC PANEL
ALBUMIN: 3.8 g/dL (ref 3.5–5.0)
ALK PHOS: 125 U/L (ref 40–150)
ALT: 12 U/L (ref 0–55)
AST: 13 U/L (ref 5–34)
Anion Gap: 7 mEq/L (ref 3–11)
BILIRUBIN TOTAL: 0.44 mg/dL (ref 0.20–1.20)
BUN: 17.5 mg/dL (ref 7.0–26.0)
CALCIUM: 10.1 mg/dL (ref 8.4–10.4)
CO2: 27 mEq/L (ref 22–29)
CREATININE: 0.8 mg/dL (ref 0.6–1.1)
Chloride: 107 mEq/L (ref 98–109)
EGFR: 80 mL/min/{1.73_m2} — ABNORMAL LOW (ref >90)
GLUCOSE: 105 mg/dL (ref 70–140)
Potassium: 5.1 mEq/L (ref 3.5–5.1)
SODIUM: 141 meq/L (ref 136–145)
TOTAL PROTEIN: 6.7 g/dL (ref 6.4–8.3)

## 2017-05-24 MED ORDER — IOPAMIDOL (ISOVUE-300) INJECTION 61%
75.0000 mL | Freq: Once | INTRAVENOUS | Status: AC | PRN
  Administered 2017-05-24: 75 mL via INTRAVENOUS

## 2017-05-24 MED ORDER — IOPAMIDOL (ISOVUE-300) INJECTION 61%
INTRAVENOUS | Status: AC
Start: 2017-05-24 — End: 2017-05-24
  Filled 2017-05-24: qty 75

## 2017-05-26 ENCOUNTER — Encounter: Payer: Self-pay | Admitting: Sports Medicine

## 2017-05-26 ENCOUNTER — Encounter: Payer: Self-pay | Admitting: Internal Medicine

## 2017-05-26 ENCOUNTER — Telehealth: Payer: Self-pay | Admitting: Internal Medicine

## 2017-05-26 ENCOUNTER — Ambulatory Visit (HOSPITAL_BASED_OUTPATIENT_CLINIC_OR_DEPARTMENT_OTHER): Payer: Medicare Other | Admitting: Internal Medicine

## 2017-05-26 VITALS — BP 129/77 | HR 83 | Temp 98.9°F | Resp 18 | Ht 64.0 in | Wt 173.0 lb

## 2017-05-26 DIAGNOSIS — C3491 Malignant neoplasm of unspecified part of right bronchus or lung: Secondary | ICD-10-CM

## 2017-05-26 DIAGNOSIS — C3411 Malignant neoplasm of upper lobe, right bronchus or lung: Secondary | ICD-10-CM | POA: Diagnosis not present

## 2017-05-26 NOTE — Telephone Encounter (Signed)
Appointments scheduled per 05/26/17 los. Patient was given a copy of the AVS report and appointment schedule, per 05/26/17 los.

## 2017-05-26 NOTE — Progress Notes (Signed)
[facility] [phone]   [phone]  OFFICE PROGRESS NOTE  Victoria Pena, [name] [address]  DIAGNOSIS: stage IB (T2a, N0, M0) non-small cell lung cancer, adenocarcinoma diagnosed in July 2016  PRIOR THERAPY: 1) status post right upper lobectomy with lymph node dissection on 07/22/2015 in [place]. 2) adjuvant systemic chemotherapy with cisplatin and Alimta status post 4 cycles completed in November 2016.  CURRENT THERAPY: Observation.  INTERVAL HISTORY: Victoria Pena 66 y.o. female returns to the clinic today for six-month follow-up visit. The patient is feeling fine today with no specific complaints except for low back pain. She is in the process of getting referral to orthopedic surgery for evaluation. She denied having any chest pain, shortness breath, cough or hemoptysis. She denied having any fever or chills. She has no nausea, vomiting, diarrhea or constipation. She had repeat CT scan of the chest performed recently and she is here for evaluation and discussion of her scan results.   MEDICAL HISTORY: Past Medical History:  Diagnosis Date  . Adenocarcinoma of right lung, stage 1 ([place]) 07/26/2016  . Anxiety   . Arthritis   . COPD (chronic obstructive pulmonary disease) ([place])   . Depression   . GERD (gastroesophageal reflux disease)   . Hypertension   . Thyroid disease     ALLERGIES:  is allergic to bactrim [sulfamethoxazole-trimethoprim]; dextromethorphan hbr; keflex [cephalexin]; percocet [oxycodone-acetaminophen]; hydrocodone; hydroxyzine; prednisone & diphenhydramine; prednisone; avelox [moxifloxacin hcl in nacl]; and moxifloxacin.  MEDICATIONS:  Current Outpatient Prescriptions  Medication Sig Dispense Refill  . Albuterol (VENTOLIN IN) Inhale into the lungs.    . ALPRAZolam (XANAX) 0.5 MG tablet Take 0.5 mg by mouth at bedtime as needed for anxiety.    Marland Kitchen aspirin 81 MG tablet Take 81  mg by mouth daily.    Marland Kitchen buPROPion (WELLBUTRIN XL) 150 MG 24 hr tablet Take 150 mg by mouth daily.    Marland Kitchen docusate sodium (COLACE) 100 MG capsule Take 100 mg by mouth 2 (two) times daily as needed for mild constipation.    Marland Kitchen levothyroxine (SYNTHROID, LEVOTHROID) 125 MCG tablet Take 125 mcg by mouth daily before breakfast.    . losartan (COZAAR) 50 MG tablet Take 50 mg by mouth daily.  0  . pantoprazole (PROTONIX) 40 MG tablet Take 40 mg by mouth daily.    . promethazine (PHENERGAN) 25 MG tablet Take 25 mg by mouth every 8 (eight) hours as needed for nausea or vomiting.     No current facility-administered medications for this visit.     SURGICAL HISTORY:  Past Surgical History:  Procedure Laterality Date  . APPENDECTOMY    . CHOLECYSTECTOMY    . THYROIDECTOMY      REVIEW OF SYSTEMS:  A comprehensive review of systems was negative except for: Musculoskeletal: positive for back pain   PHYSICAL EXAMINATION: General appearance: alert, cooperative and no distress Head: Normocephalic, without obvious abnormality, atraumatic Neck: no adenopathy, no JVD, supple, symmetrical, trachea midline and thyroid not enlarged, symmetric, no tenderness/mass/nodules Lymph nodes: Cervical, supraclavicular, and axillary nodes normal. Resp: clear to auscultation bilaterally Back: symmetric, no curvature. ROM normal. No CVA tenderness. Cardio: regular rate and rhythm, S1, S2 normal, no murmur, click, rub or gallop GI: soft, non-tender; bowel sounds normal; no masses,  no organomegaly Extremities: extremities normal, atraumatic, no cyanosis or edema  ECOG PERFORMANCE STATUS: 1 - Symptomatic but completely ambulatory  Blood pressure 129/77, pulse 83, temperature 98.9 F (  37.2 C), temperature source Oral, resp. rate 18, height 5\' 4"  (1.626 m), weight 173 lb (78.5 kg), SpO2 98 %.  LABORATORY DATA: Lab Results  Component Value Date   WBC 4.9 05/24/2017   HGB 12.7 05/24/2017   HCT 38.8 05/24/2017   MCV 87.0  05/24/2017   PLT 242 05/24/2017      Chemistry      Component Value Date/Time   NA 141 05/24/2017 0934   K 5.1 05/24/2017 0934   CO2 27 05/24/2017 0934   BUN 17.5 05/24/2017 0934   CREATININE 0.8 05/24/2017 0934      Component Value Date/Time   CALCIUM 10.1 05/24/2017 0934   ALKPHOS 125 05/24/2017 0934   AST 13 05/24/2017 0934   ALT 12 05/24/2017 0934   BILITOT 0.44 05/24/2017 0934       RADIOGRAPHIC STUDIES: Ct Chest W Contrast  Result Date: 05/24/2017 CLINICAL DATA:  History of stage IB right upper lobe adenocarcinoma diagnosed July 2016 status post right upper lobectomy and chemotherapy. Restaging. EXAM: CT CHEST WITH CONTRAST TECHNIQUE: Multidetector CT imaging of the chest was performed during intravenous contrast administration. CONTRAST:  21mL ISOVUE-300 IOPAMIDOL (ISOVUE-300) INJECTION 61% COMPARISON:  10/28/2016 chest CT. FINDINGS: Cardiovascular: Normal heart size. No significant pericardial fluid/thickening. Left anterior descending, left circumflex and right coronary atherosclerosis. Atherosclerotic nonaneurysmal thoracic aorta. Normal caliber pulmonary arteries. No central pulmonary emboli. Mediastinum/Nodes: Total thyroidectomy. Unremarkable esophagus. No pathologically enlarged axillary, mediastinal or hilar lymph nodes. Lungs/Pleura: No pneumothorax. No pleural effusion. Mild centrilobular and paraseptal emphysema. No acute consolidative airspace disease, lung masses or significant pulmonary nodules. Upper abdomen: Simple exophytic 1.3 cm renal cyst in the posterior upper left kidney. Musculoskeletal: No aggressive appearing focal osseous lesions. Moderate thoracic spondylosis. IMPRESSION: 1. No evidence of local tumor recurrence status post right upper lobectomy. 2. No evidence of metastatic disease in the chest . 3. Aortic atherosclerosis.  Three-vessel coronary atherosclerosis. 4. Mild emphysema. Electronically Signed   By: Ilona Sorrel M.D.   On: 05/24/2017 13:56     ASSESSMENT AND PLAN:  This is a very pleasant 66 years old white female with a stage IB non-small cell lung cancer, adenocarcinoma status post right upper lobectomy with lymph node dissection followed by 4 cycles of adjuvant systemic chemotherapy with cisplatin and Alimta. She had repeat CT scan of the chest performed recently. Her scan showed no evidence for disease recurrence or metastasis. I discussed the scan results with the patient today and recommended for her to continue on observation for now. I will see her back for follow-up visit in 6 months for reevaluation with repeat CT scan of the chest. She was advised to call immediately if she has any concerning symptoms in the interval. The patient voices understanding of current disease status and treatment options and is in agreement with the current care plan. All questions were answered. The patient knows to call the clinic with any problems, questions or concerns. We can certainly see the patient much sooner if necessary. I spent 10 minutes counseling the patient face to face. The total time spent in the appointment was 15 minutes. Disclaimer: This note was dictated with voice recognition software. Similar sounding words can inadvertently be transcribed and may not be corrected upon review.

## 2017-05-26 NOTE — Progress Notes (Unsigned)
Left 5th Hammertoe repair and excision (cut out) ulceration in between 4-5 toe

## 2017-06-08 ENCOUNTER — Encounter: Payer: Medicare Other | Admitting: Sports Medicine

## 2017-06-08 DIAGNOSIS — E7800 Pure hypercholesterolemia, unspecified: Secondary | ICD-10-CM | POA: Diagnosis not present

## 2017-06-08 DIAGNOSIS — K219 Gastro-esophageal reflux disease without esophagitis: Secondary | ICD-10-CM | POA: Diagnosis not present

## 2017-06-08 DIAGNOSIS — F4321 Adjustment disorder with depressed mood: Secondary | ICD-10-CM | POA: Diagnosis not present

## 2017-06-08 DIAGNOSIS — E039 Hypothyroidism, unspecified: Secondary | ICD-10-CM | POA: Diagnosis not present

## 2017-06-08 DIAGNOSIS — J449 Chronic obstructive pulmonary disease, unspecified: Secondary | ICD-10-CM | POA: Diagnosis not present

## 2017-06-08 DIAGNOSIS — E119 Type 2 diabetes mellitus without complications: Secondary | ICD-10-CM | POA: Diagnosis not present

## 2017-06-08 DIAGNOSIS — I10 Essential (primary) hypertension: Secondary | ICD-10-CM | POA: Diagnosis not present

## 2017-06-28 DIAGNOSIS — K219 Gastro-esophageal reflux disease without esophagitis: Secondary | ICD-10-CM | POA: Diagnosis not present

## 2017-06-28 DIAGNOSIS — Z85118 Personal history of other malignant neoplasm of bronchus and lung: Secondary | ICD-10-CM | POA: Diagnosis not present

## 2017-06-28 DIAGNOSIS — F329 Major depressive disorder, single episode, unspecified: Secondary | ICD-10-CM | POA: Diagnosis not present

## 2017-06-28 DIAGNOSIS — E038 Other specified hypothyroidism: Secondary | ICD-10-CM | POA: Diagnosis not present

## 2017-06-28 DIAGNOSIS — I10 Essential (primary) hypertension: Secondary | ICD-10-CM | POA: Diagnosis not present

## 2017-06-28 DIAGNOSIS — M5116 Intervertebral disc disorders with radiculopathy, lumbar region: Secondary | ICD-10-CM | POA: Diagnosis not present

## 2017-07-11 ENCOUNTER — Telehealth: Payer: Self-pay | Admitting: Internal Medicine

## 2017-07-11 NOTE — Telephone Encounter (Signed)
Faxed records to [facility] [phone]

## 2017-07-13 ENCOUNTER — Ambulatory Visit (INDEPENDENT_AMBULATORY_CARE_PROVIDER_SITE_OTHER): Payer: Medicare Other | Admitting: Sports Medicine

## 2017-07-13 ENCOUNTER — Encounter: Payer: Medicare Other | Admitting: Sports Medicine

## 2017-07-13 DIAGNOSIS — L988 Other specified disorders of the skin and subcutaneous tissue: Secondary | ICD-10-CM | POA: Diagnosis not present

## 2017-07-13 DIAGNOSIS — M79672 Pain in left foot: Secondary | ICD-10-CM | POA: Diagnosis not present

## 2017-07-13 DIAGNOSIS — M779 Enthesopathy, unspecified: Secondary | ICD-10-CM | POA: Diagnosis not present

## 2017-07-13 DIAGNOSIS — Z9889 Other specified postprocedural states: Secondary | ICD-10-CM

## 2017-07-13 DIAGNOSIS — M79671 Pain in right foot: Secondary | ICD-10-CM

## 2017-07-13 DIAGNOSIS — M25572 Pain in left ankle and joints of left foot: Secondary | ICD-10-CM

## 2017-07-13 MED ORDER — MELOXICAM 15 MG PO TABS: 15.0000 mg | ORAL_TABLET | Freq: Every day | ORAL | 0 refills | Status: DC

## 2017-07-13 NOTE — Progress Notes (Signed)
Subjective: Victoria Pena is a 66 y.o. female patient seen today in office for POV #6 (DOS 02-21-17), S/P bilateral 5th hammertoe repair and excision of soft corn in interspace. Patient denies current pain at surgical site but states that the Right toe is sore and numb but the left toe is doing fine, overall has foot pain at left ankle and feel like her orthotics are not helping; her heel is not staying in them. No other issues noted.   Patient Active Problem List   Diagnosis Date Noted  . Anxiety 12/27/2016  . Benign essential hypertension 12/27/2016  . Depression 12/27/2016  . Graves disease 12/27/2016  . Palpitations 12/27/2016  . Adenocarcinoma of right lung, stage 1 ([place]) 07/26/2016    Current Outpatient Prescriptions on File Prior to Visit  Medication Sig Dispense Refill  . Albuterol (VENTOLIN IN) Inhale into the lungs.    . ALPRAZolam (XANAX) 0.5 MG tablet Take 0.5 mg by mouth at bedtime as needed for anxiety.    Marland Kitchen aspirin 81 MG tablet Take 81 mg by mouth daily.    Marland Kitchen buPROPion (WELLBUTRIN XL) 150 MG 24 hr tablet Take 150 mg by mouth daily.    Marland Kitchen docusate sodium (COLACE) 100 MG capsule Take 100 mg by mouth 2 (two) times daily as needed for mild constipation.    Marland Kitchen levothyroxine (SYNTHROID, LEVOTHROID) 125 MCG tablet Take 125 mcg by mouth daily before breakfast.    . losartan (COZAAR) 50 MG tablet Take 50 mg by mouth daily.  0  . pantoprazole (PROTONIX) 40 MG tablet Take 40 mg by mouth daily.    . promethazine (PHENERGAN) 25 MG tablet Take 25 mg by mouth every 8 (eight) hours as needed for nausea or vomiting.     No current facility-administered medications on file prior to visit.     Allergies  Allergen Reactions  . Bactrim [Sulfamethoxazole-Trimethoprim] Itching  . Dextromethorphan Hbr Shortness Of Breath  . Keflex [Cephalexin] Shortness Of Breath    Hard time breathing  . Percocet [Oxycodone-Acetaminophen] Itching  . Hydrocodone Other (See Comments) and Anxiety   Heart racing, hard to concentrate, face flushed  . Hydroxyzine Other (See Comments) and Swelling    Numbness in lips  . Prednisone & Diphenhydramine Other (See Comments)    Face flushed, heart racing  . Prednisone Other (See Comments)  . Avelox [Moxifloxacin Hcl In Nacl] Rash  . Moxifloxacin Rash    Objective: There were no vitals filed for this visit.  General: No acute distress, AAOx3  Right and Left foot: Incisions well healed, no erythema, no warmth, no drainage, no signs of infection noted, Capillary fill time <3 seconds in all digits, gross sensation present via light touch to right and left foot. No pain or crepitation with range of motion right/left foot. Subjective pain/numbness at right 5th toe and pain at lateral ankle on left. Pes planus foot type. No pain with calf compression.    Assessment and Plan:  Problem List Items Addressed This Visit    None    Visit Diagnoses    Pain in both feet    -  Primary   Relevant Medications   meloxicam (MOBIC) 15 MG tablet   Left lateral ankle pain       Relevant Medications   meloxicam (MOBIC) 15 MG tablet   Capsulitis       Relevant Medications   meloxicam (MOBIC) 15 MG tablet   S/P bilateral foot surgery          -  Patient seen and evaluated -Patient declined injection for left ankle pain -Rx Mobic -[facility] orthotics back to [facility] for deeper heel cup, lateral flange, metatarsal pad, and add laver of PPT -Meanwhile placed heel cushion in tennis shoes -Will plan for calling patient when modified orthotics are ready for pick up. In the meantime, patient to call office if any issues or problems arise.   Landis Martins, DPM

## 2017-08-09 DIAGNOSIS — N390 Urinary tract infection, site not specified: Secondary | ICD-10-CM | POA: Diagnosis not present

## 2017-08-09 DIAGNOSIS — M545 Low back pain: Secondary | ICD-10-CM | POA: Diagnosis not present

## 2017-08-17 DIAGNOSIS — F419 Anxiety disorder, unspecified: Secondary | ICD-10-CM | POA: Diagnosis not present

## 2017-08-17 DIAGNOSIS — Z5329 Procedure and treatment not carried out because of patient's decision for other reasons: Secondary | ICD-10-CM | POA: Diagnosis not present

## 2017-08-17 DIAGNOSIS — F064 Anxiety disorder due to known physiological condition: Secondary | ICD-10-CM | POA: Diagnosis not present

## 2017-08-17 DIAGNOSIS — N390 Urinary tract infection, site not specified: Secondary | ICD-10-CM | POA: Diagnosis not present

## 2017-08-18 ENCOUNTER — Encounter (INDEPENDENT_AMBULATORY_CARE_PROVIDER_SITE_OTHER): Payer: Self-pay

## 2017-08-18 ENCOUNTER — Ambulatory Visit: Payer: Medicare Other | Admitting: Orthotics

## 2017-08-18 ENCOUNTER — Encounter: Payer: Medicare Other | Admitting: Orthotics

## 2017-08-18 DIAGNOSIS — M48061 Spinal stenosis, lumbar region without neurogenic claudication: Secondary | ICD-10-CM | POA: Diagnosis not present

## 2017-08-18 DIAGNOSIS — Z9889 Other specified postprocedural states: Secondary | ICD-10-CM

## 2017-08-18 DIAGNOSIS — M25572 Pain in left ankle and joints of left foot: Secondary | ICD-10-CM

## 2017-08-18 DIAGNOSIS — L97521 Non-pressure chronic ulcer of other part of left foot limited to breakdown of skin: Secondary | ICD-10-CM

## 2017-08-18 DIAGNOSIS — M545 Low back pain: Secondary | ICD-10-CM | POA: Diagnosis not present

## 2017-08-18 NOTE — Progress Notes (Signed)
Patient came in today to pick up custom made foot orthotics.  The goals were accomplished and the patient reported no dissatisfaction with said orthotics.  Patient was advised of breakin period and how to report any issues. 

## 2017-08-25 DIAGNOSIS — Z23 Encounter for immunization: Secondary | ICD-10-CM | POA: Diagnosis not present

## 2017-09-07 ENCOUNTER — Telehealth: Payer: Self-pay | Admitting: *Deleted

## 2017-09-07 ENCOUNTER — Ambulatory Visit (INDEPENDENT_AMBULATORY_CARE_PROVIDER_SITE_OTHER): Payer: Medicare Other

## 2017-09-07 ENCOUNTER — Encounter: Payer: Self-pay | Admitting: Sports Medicine

## 2017-09-07 ENCOUNTER — Ambulatory Visit (INDEPENDENT_AMBULATORY_CARE_PROVIDER_SITE_OTHER): Payer: Worker's Compensation | Admitting: Sports Medicine

## 2017-09-07 ENCOUNTER — Ambulatory Visit: Payer: Medicare Other | Admitting: Sports Medicine

## 2017-09-07 DIAGNOSIS — M25572 Pain in left ankle and joints of left foot: Secondary | ICD-10-CM

## 2017-09-07 DIAGNOSIS — M779 Enthesopathy, unspecified: Secondary | ICD-10-CM | POA: Diagnosis not present

## 2017-09-07 DIAGNOSIS — M66872 Spontaneous rupture of other tendons, left ankle and foot: Secondary | ICD-10-CM

## 2017-09-07 NOTE — Progress Notes (Signed)
Subjective:  Victoria Pena is a 66 y.o. female patient who presents to office for evaluation of left ankle pain. Patient complains of continued pain in the ankle over the last week states that at the inside of her ankle felt something tear when she went to step into hot bath. 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 States that pain 10/10 with swelling and throbbing in nature. Patient has tried rest and ice with no relief in symptoms. Reports that her orthotics are not helping states that she feels like her foot is rubbing at arch and that the met pad is making the balls of her feet sore by the end of the work day. Patient denies any other pedal complaints.    Patient Active Problem List   Diagnosis Date Noted  . Anxiety 12/27/2016  . Benign essential hypertension 12/27/2016  . Depression 12/27/2016  . Graves disease 12/27/2016  . Palpitations 12/27/2016  . Adenocarcinoma of right lung, stage 1 ([place]) 07/26/2016    Current Outpatient Prescriptions on File Prior to Visit  Medication Sig Dispense Refill  . Albuterol (VENTOLIN IN) Inhale into the lungs.    . ALPRAZolam (XANAX) 0.5 MG tablet Take 0.5 mg by mouth at bedtime as needed for anxiety.    Marland Kitchen aspirin 81 MG tablet Take 81 mg by mouth daily.    Marland Kitchen buPROPion (WELLBUTRIN XL) 150 MG 24 hr tablet Take 150 mg by mouth daily.    Marland Kitchen docusate sodium (COLACE) 100 MG capsule Take 100 mg by mouth 2 (two) times daily as needed for mild constipation.    Marland Kitchen levothyroxine (SYNTHROID, LEVOTHROID) 125 MCG tablet Take 125 mcg by mouth daily before breakfast.    . losartan (COZAAR) 50 MG tablet Take 50 mg by mouth daily.  0  . meloxicam (MOBIC) 15 MG tablet Take 1 tablet (15 mg total) by mouth daily. 30 tablet 0  . pantoprazole (PROTONIX) 40 MG tablet Take 40 mg by mouth daily.    . promethazine (PHENERGAN) 25 MG tablet Take 25 mg by mouth  every 8 (eight) hours as needed for nausea or vomiting.     No current facility-administered medications on file prior to visit.     Allergies  Allergen Reactions  . Bactrim [Sulfamethoxazole-Trimethoprim] Itching  . Dextromethorphan Hbr Shortness Of Breath  . Keflex [Cephalexin] Shortness Of Breath    Hard time breathing  . Percocet [Oxycodone-Acetaminophen] Itching  . Hydrocodone Other (See Comments) and Anxiety    Heart racing, hard to concentrate, face flushed  . Hydroxyzine Other (See Comments) and Swelling    Numbness in lips  . Prednisone & Diphenhydramine Other (See Comments)    Face flushed, heart racing  . Prednisone Other (See Comments)  . Avelox [Moxifloxacin Hcl In Nacl] Rash  . Moxifloxacin Rash    Objective:  General: Alert and oriented x3 in no acute distress  Dermatology: No open lesions bilateral lower extremities, no webspace macerations, no ecchymosis bilateral, all nails x 10 are well manicured. Previous 5th toe surgical sites well healed.   Vascular: Dorsalis Pedis and Posterior Tibial pedal pulses palpable, Capillary Fill Time 3 seconds,(+) pedal hair growth bilateral, focal edema left medial ankle, Temperature gradient within normal limits.  Neurology: Johney Maine sensation intact via light touch bilateral.   Musculoskeletal: Mild tenderness with palpation at medial ankle at PT tendon course on left.  Range of motion within normal limits with mild guarding on left ankle. Strength within normal limits in all groups bilateral except with inversion and eversion due to pain on left. .   Gait: Antalgic gait  Xrays  Left Ankle   Impression: Decreased osseous mineralization. There is medial ankle soft tissue swelling. No fracture. Mild joint space narrowing at ankle. No other acute findings.   Assessment and Plan: Problem List Items Addressed This Visit    None    Visit Diagnoses    Tibialis posterior tendon tear, nontraumatic, left    -  Primary   possible    Tendonitis       Relevant Orders   DG Ankle Complete Left   Acute left ankle pain           -Complete examination performed -Xrays reviewed -Discussed treatement options for tendonitis vs tear of PT tendon -Applied unna boot to assist with edema reduction on left to keep intact for 5 days -Advised patient to use CAM boot at all times until next office visit -Work note: Light duty -Rx MRI for further eval r/o tear -Recommend patient to take tramadol as needed for pain and to ice and elevate -Patient to return to office after MRI or sooner if condition worsens.  Landis Martins, DPM

## 2017-09-07 NOTE — Telephone Encounter (Addendum)
-----   Message from Landis Martins, [place] sent at 09/07/2017  2:57 PM EDT ----- Regarding: MRI left ankle Concerned for PT tendon tear. Felt a pull/rip at inside of left ankle when getting in bathtub on Tuesday. [facility] - Brittney scheduled pt for MRI left ankle without contrast for 09/10/2017 arrive at 10:00am for 10:15am appt. Left message on pt's voicemail explaining due to the urgency of the appt I felt I should leave the appt time. Faxed orders to [facility].

## 2017-09-07 NOTE — Patient Instructions (Signed)
UNNA BOOT INSTRUCTIONS  Unna boot need to be worn for 5 days for maximum benefit.  If you next appointment is before 5 days, please remove prior to coming in.    **If at any time while wearing the [facility] BOOT you should notice any irritation such as a rash, redness, or itching, remove and wash your foot/feet thoroughly.  BATHING INSTRUCTIONS  Take a washcloth, fold it a few times and tape it around your ankle.   Then take a garbage bag, place it over your foot and angle and tape it above and below the washcloth.  TAKE A QUICK SHOWER.  The washcloth should absorb any water that runs down into the garbage bag.  If your foot gets wet, take a towel and absorb as much of the water as possible. You can also take a blow dryer, put it on low heat, and dry your bandage.  If this does not help call office for further instructions if your Unna boot gets wet

## 2017-09-08 ENCOUNTER — Other Ambulatory Visit: Payer: Medicare Other

## 2017-09-08 ENCOUNTER — Ambulatory Visit: Payer: Medicare Other | Admitting: Sports Medicine

## 2017-09-13 ENCOUNTER — Other Ambulatory Visit: Payer: Self-pay | Admitting: Sports Medicine

## 2017-09-13 DIAGNOSIS — M66872 Spontaneous rupture of other tendons, left ankle and foot: Secondary | ICD-10-CM

## 2017-09-13 DIAGNOSIS — Z6829 Body mass index (BMI) 29.0-29.9, adult: Secondary | ICD-10-CM | POA: Diagnosis not present

## 2017-09-13 DIAGNOSIS — I10 Essential (primary) hypertension: Secondary | ICD-10-CM | POA: Diagnosis not present

## 2017-09-13 DIAGNOSIS — M545 Low back pain: Secondary | ICD-10-CM | POA: Diagnosis not present

## 2017-09-13 DIAGNOSIS — M48062 Spinal stenosis, lumbar region with neurogenic claudication: Secondary | ICD-10-CM | POA: Diagnosis not present

## 2017-09-13 DIAGNOSIS — M4316 Spondylolisthesis, lumbar region: Secondary | ICD-10-CM | POA: Diagnosis not present

## 2017-09-21 ENCOUNTER — Ambulatory Visit (INDEPENDENT_AMBULATORY_CARE_PROVIDER_SITE_OTHER): Payer: Worker's Compensation | Admitting: Sports Medicine

## 2017-09-21 ENCOUNTER — Telehealth: Payer: Self-pay | Admitting: *Deleted

## 2017-09-21 ENCOUNTER — Encounter (INDEPENDENT_AMBULATORY_CARE_PROVIDER_SITE_OTHER): Payer: Self-pay

## 2017-09-21 ENCOUNTER — Encounter: Payer: Self-pay | Admitting: Sports Medicine

## 2017-09-21 DIAGNOSIS — M25572 Pain in left ankle and joints of left foot: Secondary | ICD-10-CM

## 2017-09-21 DIAGNOSIS — M66872 Spontaneous rupture of other tendons, left ankle and foot: Secondary | ICD-10-CM

## 2017-09-21 DIAGNOSIS — M779 Enthesopathy, unspecified: Secondary | ICD-10-CM | POA: Diagnosis not present

## 2017-09-21 NOTE — Progress Notes (Signed)
Subjective:  Victoria Pena is a 66 y.o. female patient who returns to office for follow up on left ankle pain. Reports that she has not gotten her MRI, states that she tried to get it under work comp but they denied the claim because it was difficult to prove that the pain started at work. Patient states that her job has been letting her do light duty but they are requesting return to full duty on tomorrow of which the patient can not do because of pain. States that the unna boot helped and that the swelling is better but still has pain the more that she is walking or standing especially towards end of the day. Feels like the boot helps. Patient denies any other pedal complaints.    Patient Active Problem List   Diagnosis Date Noted  . Anxiety 12/27/2016  . Benign essential hypertension 12/27/2016  . Depression 12/27/2016  . Graves disease 12/27/2016  . Palpitations 12/27/2016  . Adenocarcinoma of right lung, stage 1 ([place]) 07/26/2016    Current Outpatient Prescriptions on File Prior to Visit  Medication Sig Dispense Refill  . Albuterol (VENTOLIN IN) Inhale into the lungs.    . ALPRAZolam (XANAX) 0.5 MG tablet Take 0.5 mg by mouth at bedtime as needed for anxiety.    Marland Kitchen aspirin 81 MG tablet Take 81 mg by mouth daily.    Marland Kitchen buPROPion (WELLBUTRIN XL) 150 MG 24 hr tablet Take 150 mg by mouth daily.    Marland Kitchen docusate sodium (COLACE) 100 MG capsule Take 100 mg by mouth 2 (two) times daily as needed for mild constipation.    Marland Kitchen levothyroxine (SYNTHROID, LEVOTHROID) 125 MCG tablet Take 125 mcg by mouth daily before breakfast.    . losartan (COZAAR) 50 MG tablet Take 50 mg by mouth daily.  0  . meloxicam (MOBIC) 15 MG tablet Take 1 tablet (15 mg total) by mouth daily. 30 tablet 0  . pantoprazole (PROTONIX) 40 MG tablet Take 40 mg by mouth daily.    . promethazine (PHENERGAN) 25 MG tablet Take 25 mg by mouth every 8 (eight) hours as needed for nausea or vomiting.     No current facility-administered  medications on file prior to visit.     Allergies  Allergen Reactions  . Bactrim [Sulfamethoxazole-Trimethoprim] Itching  . Dextromethorphan Hbr Shortness Of Breath  . Keflex [Cephalexin] Shortness Of Breath    Hard time breathing  . Percocet [Oxycodone-Acetaminophen] Itching  . Hydrocodone Other (See Comments) and Anxiety    Heart racing, hard to concentrate, face flushed  . Hydroxyzine Other (See Comments) and Swelling    Numbness in lips  . Prednisone & Diphenhydramine Other (See Comments)    Face flushed, heart racing  . Prednisone Other (See Comments)  . Avelox [Moxifloxacin Hcl In Nacl] Rash  . Moxifloxacin Rash    Objective:  General: Alert and oriented x3 in no acute distress  Dermatology: No open lesions bilateral lower extremities, no webspace macerations, no ecchymosis bilateral, all nails x 10 are well manicured. Previous 5th toe surgical sites well healed.   Vascular: Dorsalis Pedis and Posterior Tibial pedal pulses palpable, Capillary Fill Time 3 seconds,(+) pedal hair growth bilateral, focal edema left medial ankle, Temperature gradient within normal limits.  Neurology: Johney Maine sensation intact via light touch bilateral.   Musculoskeletal: Mild tenderness with palpation at medial ankle at PT tendon course on left. Range of motion within normal limits with mild guarding on left ankle. Strength within normal limits in all groups  bilateral except with inversion and eversion due to pain on left. .   Gait: Antalgic gait   Assessment and Plan: Problem List Items Addressed This Visit    None    Visit Diagnoses    Tendonitis    -  Primary   Nontraumatic tear of tibialis posterior tendon, left       possible   Acute left ankle pain           -Complete examination performed -Re-Discussed treatement options for tendonitis vs tear of PT tendon -Dispensed surgitube compression sock for left  -Advised patient to use CAM boot at all times until next office visit -Work  note: Light duty, must wear boot  -Awaiting MRI for further eval r/o tear -Recommend patient to take tramadol as needed for pain and to ice and elevate -Patient to return to office after MRI or sooner if condition worsens.  Landis Martins, DPM

## 2017-09-21 NOTE — Telephone Encounter (Deleted)
-----   Message from Landis Martins, [place] sent at 09/21/2017 11:02 AM EDT ----- Regarding: MRI  Patient is still waiting for MRI for Left ankle to r/o tendon tear, can we please follow up and let the patient know if its been authorized and how to schedule  Thanks Dr. Chauncey Cruel

## 2017-09-22 NOTE — Telephone Encounter (Signed)
Thanks

## 2017-09-22 NOTE — Telephone Encounter (Addendum)
-----   Message from Landis Martins, [place] sent at 09/21/2017 11:02 AM EDT ----- Regarding: MRI  Patient is still waiting for MRI for Left ankle to r/o tendon tear, can we please follow up and let the patient know if its been authorized and how to schedule  Thanks Dr. S.09/22/2017-Left message informing pt, I was not able to prior authorize her MRI, due to her insurance was not in their system, and for her to call her insurance and send updated information to our office. Pt states has new Medicare card. I reviewed Media and did not find, but was able to pull up in Misc Reports. I told pt I would fax immediately, and she could schedule her appt [phone].

## 2017-09-24 DIAGNOSIS — R600 Localized edema: Secondary | ICD-10-CM | POA: Diagnosis not present

## 2017-09-24 DIAGNOSIS — M779 Enthesopathy, unspecified: Secondary | ICD-10-CM | POA: Diagnosis not present

## 2017-09-24 DIAGNOSIS — M2142 Flat foot [pes planus] (acquired), left foot: Secondary | ICD-10-CM | POA: Diagnosis not present

## 2017-09-24 DIAGNOSIS — M66872 Spontaneous rupture of other tendons, left ankle and foot: Secondary | ICD-10-CM | POA: Diagnosis not present

## 2017-09-26 ENCOUNTER — Telehealth: Payer: Self-pay | Admitting: Sports Medicine

## 2017-09-26 NOTE — Telephone Encounter (Signed)
Patient will need appt this week to discuss results of MRI Thanks Dr. Chauncey Cruel

## 2017-09-26 NOTE — Telephone Encounter (Signed)
Patient had MRI on Saturday

## 2017-09-28 ENCOUNTER — Encounter: Payer: Self-pay | Admitting: Sports Medicine

## 2017-09-28 ENCOUNTER — Telehealth: Payer: Self-pay | Admitting: *Deleted

## 2017-09-28 ENCOUNTER — Ambulatory Visit (INDEPENDENT_AMBULATORY_CARE_PROVIDER_SITE_OTHER): Payer: Medicare Other | Admitting: Sports Medicine

## 2017-09-28 DIAGNOSIS — K219 Gastro-esophageal reflux disease without esophagitis: Secondary | ICD-10-CM | POA: Diagnosis not present

## 2017-09-28 DIAGNOSIS — M66872 Spontaneous rupture of other tendons, left ankle and foot: Secondary | ICD-10-CM

## 2017-09-28 DIAGNOSIS — E038 Other specified hypothyroidism: Secondary | ICD-10-CM | POA: Diagnosis not present

## 2017-09-28 DIAGNOSIS — I10 Essential (primary) hypertension: Secondary | ICD-10-CM | POA: Diagnosis not present

## 2017-09-28 DIAGNOSIS — Z85118 Personal history of other malignant neoplasm of bronchus and lung: Secondary | ICD-10-CM | POA: Diagnosis not present

## 2017-09-28 DIAGNOSIS — Z01818 Encounter for other preprocedural examination: Secondary | ICD-10-CM | POA: Diagnosis not present

## 2017-09-28 DIAGNOSIS — Q742 Other congenital malformations of lower limb(s), including pelvic girdle: Secondary | ICD-10-CM | POA: Diagnosis not present

## 2017-09-28 DIAGNOSIS — F329 Major depressive disorder, single episode, unspecified: Secondary | ICD-10-CM | POA: Diagnosis not present

## 2017-09-28 DIAGNOSIS — M25572 Pain in left ankle and joints of left foot: Secondary | ICD-10-CM

## 2017-09-28 DIAGNOSIS — L718 Other rosacea: Secondary | ICD-10-CM | POA: Diagnosis not present

## 2017-09-28 DIAGNOSIS — M5116 Intervertebral disc disorders with radiculopathy, lumbar region: Secondary | ICD-10-CM | POA: Diagnosis not present

## 2017-09-28 DIAGNOSIS — M779 Enthesopathy, unspecified: Secondary | ICD-10-CM | POA: Diagnosis not present

## 2017-09-28 NOTE — Patient Instructions (Signed)
Pre-Operative Instructions  Congratulations, you have decided to take an important step towards improving your quality of life.  You can be assured that the doctors and staff at [facility] will be with you every step of the way.  Here are some important things you should know:  1. Plan to be at the surgery center/hospital at least 1 (one) hour prior to your scheduled time, unless otherwise directed by the surgical center/hospital staff.  You must have a responsible adult accompany you, remain during the surgery and drive you home.  Make sure you have directions to the surgical center/hospital to ensure you arrive on time. 2. If you are having surgery at [facility] or [facility], you will need a copy of your medical history and physical form from your family physician within one month prior to the date of surgery. We will give you a form for your primary physician to complete.  3. We make every effort to accommodate the date you request for surgery.  However, there are times where surgery dates or times have to be moved.  We will contact you as soon as possible if a change in schedule is required.   4. No aspirin/ibuprofen for one week before surgery.  If you are on aspirin, any non-steroidal anti-inflammatory medications (Mobic, Aleve, Ibuprofen) should not be taken seven (7) days prior to your surgery.  You make take Tylenol for pain prior to surgery.  5. Medications - If you are taking daily heart and blood pressure medications, seizure, reflux, allergy, asthma, anxiety, pain or diabetes medications, make sure you notify the surgery center/hospital before the day of surgery so they can tell you which medications you should take or avoid the day of surgery. 6. No food or drink after midnight the night before surgery unless directed otherwise by surgical center/hospital staff. 7. No alcoholic beverages 24-hours prior to surgery.  No smoking 24-hours prior or 24-hours after  surgery. 8. Wear loose pants or shorts. They should be loose enough to fit over bandages, boots, and casts. 9. Don't wear slip-on shoes. Sneakers are preferred. 10. Bring your boot with you to the surgery center/hospital.  Also bring crutches or a walker if your physician has prescribed it for you.  If you do not have this equipment, it will be provided for you after surgery. 11. If you have not been contacted by the surgery center/hospital by the day before your surgery, call to confirm the date and time of your surgery. 12. Leave-time from work may vary depending on the type of surgery you have.  Appropriate arrangements should be made prior to surgery with your employer. 13. Prescriptions will be provided immediately following surgery by your doctor.  Fill these as soon as possible after surgery and take the medication as directed. Pain medications will not be refilled on weekends and must be approved by the doctor. 14. Remove nail polish on the operative foot and avoid getting pedicures prior to surgery. 15. Wash the night before surgery.  The night before surgery wash the foot and leg well with water and the antibacterial soap provided. Be sure to pay special attention to beneath the toenails and in between the toes.  Wash for at least three (3) minutes. Rinse thoroughly with water and dry well with a towel.  Perform this wash unless told not to do so by your physician.  Enclosed: 1 Ice pack (please put in freezer the night before surgery)   1 Hibiclens skin cleaner     Pre-op instructions  If you have any questions regarding the instructions, please do not hesitate to call our office.  [place]: [address], [place], [place] [address] -- [place]: [address], [place], [place] [address] -- [phone]  [place]: [place],  [address] -- [phone]  [place]: [address] [place], [place],  [address] -- [phone]  Website:  [email or web address] INSTRUCTIONS FOR ORTHOTICS  Don't expect to be comfortable wearing your orthotic devices for the first time.  Like eyeglasses, you may be aware of them as time passes, they will not be uncomfortable and you will enjoy wearing them.  FOLLOW THESE INSTRUCTIONS EXACTLY!  16. Wear your orthotic devices for:       Not more than 1 hour the first day.       Not more than 2 hours the second day.       Not more than 3 hours the third day and so on.        Or wear them for as long as they feel comfortable.       If you experience discomfort in your feet or legs take them out.  When feet & legs feel       better, put them back in.  You do need to be consistent and wear them a little        everyday. 2.   If at any time the orthotic devices become acutely uncomfortable before the       time for that particular day, STOP WEARING THEM. 3.   On the next day, do not increase the wearing time. 4.   Subsequently, increase the wearing time by 15-30 minutes only if comfortable to do       so. 5.   You will be seen by your doctor about 2-4 weeks after you receive your orthotic       devices, at which time you will probably be wearing your devices comfortably        for about 8 hours or more a day. 6.   Some patients occasionally report mild aches or discomfort in other parts of the of       body such as the knees, hips or back after 3 or 4 consecutive hours of wear.  If this       is the case with you, do not extend your wearing time.  Instead, cut it back an hour or       two.  In all likelihood, these symptoms will disappear in a short period of time as your       body posture realigns itself and functions more efficiently. 7.   It is possible that your orthotic device may require some small changes or adjustment       to improve their function or make them more comfortable.   This is usually not done       before one to three months have elapsed.  These adjustments are  made in        accordance with the changed position your feet are assuming as a result of       improved biomechanical function. 8.   In women's shoes, it's not unusual for your heel to slip out of the shoe, particularly if       they are step-in-shoes.  If this is the case, try other shoes or other styles.  Try to       purchase shoes which  have deeper heal seats or higher heel counters. 9.   Squeaking of orthotics devices in the shoes is due to the movement of the devices       when they are functioning normally.  To eliminate squeaking, simply dust some       baby powder into your shoes before inserting the devices.  If this does not work,        apply soap or wax to the edges of the orthotic devices or put a tissue into the shoes. 10. It is important that you follow these directions explicitly.  Failure to do so will simply       prolong the adjustment period or create problems which are easily avoided.  It makes       no difference if you are wearing your orthotic devices for only a few hours after        several months, so long as you are wearing them comfortably for those hours. 11. If you have any questions or complaints, contact our office.  We have no way of       knowing about your problems unless you tell us.  If we do not hear from you, we will       assume that you are proceeding well.

## 2017-09-28 NOTE — Progress Notes (Signed)
Subjective:  Victoria Pena is a 66 y.o. female patient who returns to office for follow up on left ankle pain. Reports that she got her MRI, patient states that her job has now terminated her. States that she had acquired too many points in the point system in her job, let her go. Patient states that her left foot and ankle still hurts even when not moving. States that the compression sleeve in the boot helps keep the swelling down. Feels like the boot helps. Patient denies any other pedal complaints.    Patient Active Problem List   Diagnosis Date Noted  . Anxiety 12/27/2016  . Benign essential hypertension 12/27/2016  . Depression 12/27/2016  . Graves disease 12/27/2016  . Palpitations 12/27/2016  . Adenocarcinoma of right lung, stage 1 ([place]) 07/26/2016    Current Outpatient Prescriptions on File Prior to Visit  Medication Sig Dispense Refill  . Albuterol (VENTOLIN IN) Inhale into the lungs.    . ALPRAZolam (XANAX) 0.5 MG tablet Take 0.5 mg by mouth at bedtime as needed for anxiety.    Marland Kitchen aspirin 81 MG tablet Take 81 mg by mouth daily.    Marland Kitchen buPROPion (WELLBUTRIN XL) 150 MG 24 hr tablet Take 150 mg by mouth daily.    Marland Kitchen docusate sodium (COLACE) 100 MG capsule Take 100 mg by mouth 2 (two) times daily as needed for mild constipation.    Marland Kitchen levothyroxine (SYNTHROID, LEVOTHROID) 125 MCG tablet Take 125 mcg by mouth daily before breakfast.    . losartan (COZAAR) 50 MG tablet Take 50 mg by mouth daily.  0  . meloxicam (MOBIC) 15 MG tablet Take 1 tablet (15 mg total) by mouth daily. 30 tablet 0  . pantoprazole (PROTONIX) 40 MG tablet Take 40 mg by mouth daily.    . promethazine (PHENERGAN) 25 MG tablet Take 25 mg by mouth every 8 (eight) hours as needed for nausea or vomiting.     No current facility-administered medications on file prior to visit.     Allergies  Allergen Reactions  . Bactrim [Sulfamethoxazole-Trimethoprim] Itching  . Dextromethorphan Hbr Shortness Of Breath  . Keflex  [Cephalexin] Shortness Of Breath    Hard time breathing  . Percocet [Oxycodone-Acetaminophen] Itching  . Hydrocodone Other (See Comments) and Anxiety    Heart racing, hard to concentrate, face flushed  . Hydroxyzine Other (See Comments) and Swelling    Numbness in lips  . Prednisone & Diphenhydramine Other (See Comments)    Face flushed, heart racing  . Prednisone Other (See Comments)  . Avelox [Moxifloxacin Hcl In Nacl] Rash  . Moxifloxacin Rash   Past Surgical History:  Procedure Laterality Date  . APPENDECTOMY    . CHOLECYSTECTOMY    . THYROIDECTOMY     Family History  Problem Relation Age of Onset  . Cancer Mother   . Cancer Father   . Cancer Brother    Social History   Social History  . Marital status: Widowed    Spouse name: N/A  . Number of children: N/A  . Years of education: N/A   Social History Main Topics  . Smoking status: Former Smoker    Packs/day: 1.50    Years: 35.00    Quit date: 07/27/1999  . Smokeless tobacco: Never Used  . Alcohol use No  . Drug use: No  . Sexual activity: Not on file   Other Topics Concern  . Not on file   Social History Narrative  . No narrative on file  Objective:  General: Alert and oriented x3 in no acute distress  Dermatology: No open lesions bilateral lower extremities, no webspace macerations, no ecchymosis bilateral, all nails x 10 are well manicured. Previous 5th toe surgical sites well healed.   Vascular: Dorsalis Pedis and Posterior Tibial pedal pulses palpable, Capillary Fill Time 3 seconds,(+) pedal hair growth bilateral, focal edema left medial ankle, Temperature gradient within normal limits.  Neurology: Johney Maine sensation intact via light touch bilateral.   Musculoskeletal: Mild tenderness with palpation at medial ankle at PT tendon course on left. Range of motion within normal limits with mild guarding on left ankle. Strength within normal limits in all groups bilateral except with inversion and eversion due  to pain on left. .   Gait: Antalgic gait Cane assisted  MRI suggestive of split tear in the posterior tibial tingling extending from the medial malleolus to the level of the navicular with a accessory navicular present. Mild soft tissue swelling. No other acute findings.  Assessment and Plan: Problem List Items Addressed This Visit    None    Visit Diagnoses    Acute left ankle pain    -  Primary   Nontraumatic tear of tibialis posterior tendon, left       Tendonitis       Accessory navicular bone of right foot       Pre-operative exam         -Complete examination performed -Re-Discussed treatement options for tear of PT tendon with a accessory navicular -MRI results reviewed -Patient opt for surgical management. Consent obtained for left posterior tibial tendon repair with tendon advancement/Kidner procedure. Pre and Post op course explained. Risks, benefits, alternatives explained. No guarantees given or implied. Surgical booking slip submitted and provided patient with Surgical packet and info for [place] -To dispense crutches at surgical center, patient to be nonweightbearing initially for a minimum 2-4 weeks -Continue with surgitube compression sock for left for edema control -Advised patient to use CAM boot at all times until time for surgery -Recommend patient to take tramadol as needed for pain and to ice and elevate -Patient to return to office after surgery or sooner if condition worsens.  Landis Martins, DPM

## 2017-09-28 NOTE — Telephone Encounter (Signed)
"  I was supposed to call today to schedule a surgery with Dr. Cannon Kettle on my ankle.  Call me so we can set up the appointment."

## 2017-09-30 NOTE — Telephone Encounter (Signed)
"  Calling concerning scheduling an appointment for having surgery on my left foot from Dr. Leeanne Rio office at the [facility].  Please give me a call."

## 2017-09-30 NOTE — Telephone Encounter (Signed)
"  I am calling to schedule my surgery with Dr. Cannon Kettle at [facility]."  Do you have a date in mind?  "I'd like to do it as soon as possible."  She can do it on October 22 or 29.  "Let's schedule it for October 22.  I've already registered through the portal.  Is there anything else I need to do?"  No, they will call you with the time the Friday before.  (Melody please send me her paperwork)

## 2017-10-10 ENCOUNTER — Encounter: Payer: Self-pay | Admitting: Sports Medicine

## 2017-10-10 DIAGNOSIS — Y939 Activity, unspecified: Secondary | ICD-10-CM | POA: Diagnosis not present

## 2017-10-10 DIAGNOSIS — Y999 Unspecified external cause status: Secondary | ICD-10-CM | POA: Diagnosis not present

## 2017-10-10 DIAGNOSIS — I10 Essential (primary) hypertension: Secondary | ICD-10-CM | POA: Diagnosis not present

## 2017-10-10 DIAGNOSIS — M66872 Spontaneous rupture of other tendons, left ankle and foot: Secondary | ICD-10-CM | POA: Diagnosis not present

## 2017-10-10 DIAGNOSIS — S86112A Strain of other muscle(s) and tendon(s) of posterior muscle group at lower leg level, left leg, initial encounter: Secondary | ICD-10-CM | POA: Diagnosis not present

## 2017-10-10 DIAGNOSIS — S86812A Strain of other muscle(s) and tendon(s) at lower leg level, left leg, initial encounter: Secondary | ICD-10-CM | POA: Diagnosis not present

## 2017-10-10 DIAGNOSIS — Q6689 Other  specified congenital deformities of feet: Secondary | ICD-10-CM | POA: Diagnosis not present

## 2017-10-10 DIAGNOSIS — M7752 Other enthesopathy of left foot: Secondary | ICD-10-CM | POA: Diagnosis not present

## 2017-10-10 DIAGNOSIS — X58XXXA Exposure to other specified factors, initial encounter: Secondary | ICD-10-CM | POA: Diagnosis not present

## 2017-10-10 DIAGNOSIS — Y929 Unspecified place or not applicable: Secondary | ICD-10-CM | POA: Diagnosis not present

## 2017-10-10 DIAGNOSIS — Q742 Other congenital malformations of lower limb(s), including pelvic girdle: Secondary | ICD-10-CM | POA: Diagnosis not present

## 2017-10-10 DIAGNOSIS — M25572 Pain in left ankle and joints of left foot: Secondary | ICD-10-CM | POA: Diagnosis not present

## 2017-10-11 ENCOUNTER — Telehealth: Payer: Self-pay | Admitting: Sports Medicine

## 2017-10-11 NOTE — Telephone Encounter (Signed)
Post op phone call made to patient. Patient did not answer. Left voicemail reiterating post op instructions and with instructions to call office if there are any problems or concerns. -Dr. Cannon Kettle

## 2017-10-19 ENCOUNTER — Ambulatory Visit (INDEPENDENT_AMBULATORY_CARE_PROVIDER_SITE_OTHER): Payer: Medicare Other | Admitting: Sports Medicine

## 2017-10-19 ENCOUNTER — Ambulatory Visit (INDEPENDENT_AMBULATORY_CARE_PROVIDER_SITE_OTHER): Payer: Medicare Other

## 2017-10-19 DIAGNOSIS — M66872 Spontaneous rupture of other tendons, left ankle and foot: Secondary | ICD-10-CM

## 2017-10-19 DIAGNOSIS — M25572 Pain in left ankle and joints of left foot: Secondary | ICD-10-CM

## 2017-10-19 DIAGNOSIS — Z9889 Other specified postprocedural states: Secondary | ICD-10-CM

## 2017-10-19 DIAGNOSIS — Q742 Other congenital malformations of lower limb(s), including pelvic girdle: Secondary | ICD-10-CM

## 2017-10-19 MED ORDER — OXYCODONE-ACETAMINOPHEN 10-325 MG PO TABS: 1.0000 | ORAL_TABLET | Freq: Three times a day (TID) | ORAL | 0 refills | Status: DC | PRN

## 2017-10-19 NOTE — Progress Notes (Signed)
Subjective: Luvenia Cranford is a 66 y.o. female patient seen today in office for POV #1 (DOS 10-10-17), S/P Left PT tendon repair/advancement/kidner with anchor and removal of accessory nacvicular. Patient admits some pain at surgical site, denies calf pain, denies headache, chest pain, shortness of breath, nausea, vomiting, fever, or chills. Patient states that she is doing ok. Tolerated Percocet with no issues. No other issues noted.   Patient Active Problem List   Diagnosis Date Noted  . Anxiety 12/27/2016  . Benign essential hypertension 12/27/2016  . Depression 12/27/2016  . Graves disease 12/27/2016  . Palpitations 12/27/2016  . Adenocarcinoma of right lung, stage 1 ([place]) 07/26/2016    Current Outpatient Prescriptions on File Prior to Visit  Medication Sig Dispense Refill  . Albuterol (VENTOLIN IN) Inhale into the lungs.    . ALPRAZolam (XANAX) 0.5 MG tablet Take 0.5 mg by mouth at bedtime as needed for anxiety.    Marland Kitchen aspirin 81 MG tablet Take 81 mg by mouth daily.    Marland Kitchen buPROPion (WELLBUTRIN XL) 150 MG 24 hr tablet Take 150 mg by mouth daily.    Marland Kitchen docusate sodium (COLACE) 100 MG capsule Take 100 mg by mouth 2 (two) times daily as needed for mild constipation.    Marland Kitchen levothyroxine (SYNTHROID, LEVOTHROID) 125 MCG tablet Take 125 mcg by mouth daily before breakfast.    . losartan (COZAAR) 50 MG tablet Take 50 mg by mouth daily.  0  . meloxicam (MOBIC) 15 MG tablet Take 1 tablet (15 mg total) by mouth daily. 30 tablet 0  . pantoprazole (PROTONIX) 40 MG tablet Take 40 mg by mouth daily.    . promethazine (PHENERGAN) 25 MG tablet Take 25 mg by mouth every 8 (eight) hours as needed for nausea or vomiting.     No current facility-administered medications on file prior to visit.     Allergies  Allergen Reactions  . Bactrim [Sulfamethoxazole-Trimethoprim] Itching  . Dextromethorphan Hbr Shortness Of Breath  . Keflex [Cephalexin] Shortness Of Breath    Hard time breathing  . Percocet  [Oxycodone-Acetaminophen] Itching  . Hydrocodone Other (See Comments) and Anxiety    Heart racing, hard to concentrate, face flushed  . Hydroxyzine Other (See Comments) and Swelling    Numbness in lips  . Prednisone & Diphenhydramine Other (See Comments)    Face flushed, heart racing  . Prednisone Other (See Comments)  . Avelox [Moxifloxacin Hcl In Nacl] Rash  . Moxifloxacin Rash    Objective: There were no vitals filed for this visit.  General: No acute distress, AAOx3  Left foot/ankle: Sutures intact with no gapping or dehiscence at surgical site, mild swelling to left medial foot and ankle, no erythema, no warmth, no drainage, no signs of infection noted, Capillary fill time <3 seconds in all digits, gross sensation present via light touch to left foot. No pain or crepitation with range of motion left foot however there is mild guarding.  No pain with calf compression.   Post Op Xray,Left foot: Radiolucent anchor at navicular. No other acute findings. Soft tissue swelling within normal limits for post op status.   Assessment and Plan:  Problem List Items Addressed This Visit    None    Visit Diagnoses    S/P foot surgery, left    -  Primary   Nontraumatic tear of tibialis posterior tendon, left       Accessory navicular bone of right foot       Acute left ankle pain          -  Patient seen and evaluated -Xrays reviewed  -Applied dry sterile dressing to surgical site left foot secured with ACE wrap and stockinet  -Advised patient to make sure to keep dressings clean, dry, and intact to left surgical site, removing the ACE as needed  -Advised patient to continue with boot and nonweightbearing with assistance with crutches  -Advised patient to limit activity to necessity  -Advised patient to ice and elevate as necessary  -Continue with PRN meds, Refilled percocet  -Will plan for possible suture removal at next office visit. In the meantime, patient to call office if any issues  or problems arise.   Landis Martins, DPM

## 2017-10-26 ENCOUNTER — Ambulatory Visit (INDEPENDENT_AMBULATORY_CARE_PROVIDER_SITE_OTHER): Payer: Medicare HMO | Admitting: Sports Medicine

## 2017-10-26 ENCOUNTER — Encounter: Payer: Self-pay | Admitting: Sports Medicine

## 2017-10-26 DIAGNOSIS — Q742 Other congenital malformations of lower limb(s), including pelvic girdle: Secondary | ICD-10-CM

## 2017-10-26 DIAGNOSIS — Z9889 Other specified postprocedural states: Secondary | ICD-10-CM

## 2017-10-26 DIAGNOSIS — M25572 Pain in left ankle and joints of left foot: Secondary | ICD-10-CM

## 2017-10-26 DIAGNOSIS — M66872 Spontaneous rupture of other tendons, left ankle and foot: Secondary | ICD-10-CM

## 2017-10-26 NOTE — Progress Notes (Signed)
Subjective: Neala Miggins is a 66 y.o. female patient seen today in office for POV #2 (DOS 10-10-17), S/P Left PT tendon repair/advancement/kidner with anchor and removal of accessory nacvicular. Patient admits some pain at surgical site, states that she has not been staying off her foot like instructed.  Patient denies calf pain, denies headache, chest pain, shortness of breath, nausea, vomiting, fever, or chills. Patient states that she is doing ok taking Motrin as needed.  Reports that she is concerned about her daughter and her ex-husband who was diagnosed with cancer.  No other issues noted.   Patient Active Problem List   Diagnosis Date Noted  . Anxiety 12/27/2016  . Benign essential hypertension 12/27/2016  . Depression 12/27/2016  . Graves disease 12/27/2016  . Palpitations 12/27/2016  . Adenocarcinoma of right lung, stage 1 ([place]) 07/26/2016    Current Outpatient Medications on File Prior to Visit  Medication Sig Dispense Refill  . Albuterol (VENTOLIN IN) Inhale into the lungs.    . ALPRAZolam (XANAX) 0.5 MG tablet Take 0.5 mg by mouth at bedtime as needed for anxiety.    Marland Kitchen aspirin 81 MG tablet Take 81 mg by mouth daily.    Marland Kitchen buPROPion (WELLBUTRIN XL) 150 MG 24 hr tablet Take 150 mg by mouth daily.    Marland Kitchen docusate sodium (COLACE) 100 MG capsule Take 100 mg by mouth 2 (two) times daily as needed for mild constipation.    Marland Kitchen levothyroxine (SYNTHROID, LEVOTHROID) 125 MCG tablet Take 125 mcg by mouth daily before breakfast.    . losartan (COZAAR) 50 MG tablet Take 50 mg by mouth daily.  0  . meloxicam (MOBIC) 15 MG tablet Take 1 tablet (15 mg total) by mouth daily. 30 tablet 0  . oxyCODONE-acetaminophen (PERCOCET) 10-325 MG tablet Take 1 tablet by mouth every 8 (eight) hours as needed for pain. 20 tablet 0  . pantoprazole (PROTONIX) 40 MG tablet Take 40 mg by mouth daily.    . promethazine (PHENERGAN) 25 MG tablet Take 25 mg by mouth every 8 (eight) hours as needed for nausea or  vomiting.     No current facility-administered medications on file prior to visit.     Allergies  Allergen Reactions  . Bactrim [Sulfamethoxazole-Trimethoprim] Itching  . Dextromethorphan Hbr Shortness Of Breath  . Keflex [Cephalexin] Shortness Of Breath    Hard time breathing  . Percocet [Oxycodone-Acetaminophen] Itching  . Hydrocodone Other (See Comments) and Anxiety    Heart racing, hard to concentrate, face flushed  . Hydroxyzine Other (See Comments) and Swelling    Numbness in lips  . Prednisone & Diphenhydramine Other (See Comments)    Face flushed, heart racing  . Prednisone Other (See Comments)  . Avelox [Moxifloxacin Hcl In Nacl] Rash  . Moxifloxacin Rash    Objective: There were no vitals filed for this visit.  General: No acute distress, AAOx3  Left foot/ankle: Sutures intact with no gapping or dehiscence at surgical site, mild swelling to left medial foot and ankle, no erythema, no warmth, no drainage, no signs of infection noted, Capillary fill time <3 seconds in all digits, gross sensation present via light touch to left foot. No pain or crepitation with range of motion left foot however there is mild guarding.  No pain with calf compression.   Assessment and Plan:  Problem List Items Addressed This Visit    None    Visit Diagnoses    S/P foot surgery, left    -  Primary  Nontraumatic tear of tibialis posterior tendon, left       Accessory navicular bone of right foot       Acute left ankle pain          -Patient seen and evaluated -Every other suture was removed -Applied dry sterile dressing to surgical site left foot secured with ACE wrap and stockinet  -Advised patient to make sure to keep dressings clean, dry, and intact to left surgical site, removing the ACE as needed  -Advised patient to continue with boot and nonweightbearing with assistance with crutches  -Advised patient to limit activity to necessity  -Advised patient to ice and elevate as  necessary  -Continue with PRN meds -Will plan for finishing suture removal at next office visit and allowing patient to shower and partial weight-bear to heel only. In the meantime, patient to call office if any issues or problems arise.   Landis Martins, DPM

## 2017-11-03 ENCOUNTER — Ambulatory Visit (INDEPENDENT_AMBULATORY_CARE_PROVIDER_SITE_OTHER): Payer: Medicare HMO | Admitting: Sports Medicine

## 2017-11-03 ENCOUNTER — Encounter: Payer: Self-pay | Admitting: Sports Medicine

## 2017-11-03 DIAGNOSIS — Z9889 Other specified postprocedural states: Secondary | ICD-10-CM

## 2017-11-03 DIAGNOSIS — M79605 Pain in left leg: Secondary | ICD-10-CM

## 2017-11-03 NOTE — Progress Notes (Signed)
Subjective: Victoria Pena is a 66 y.o. female patient seen today in office for POV #3 (DOS 10-10-17), S/P Left PT tendon repair/advancement/kidner with anchor and removal of accessory nacvicular. Patient admits some pain at surgical site.  Patient denies calf pain, denies headache, chest pain, shortness of breath, nausea, vomiting, fever, or chills. Patient states that she is doing ok taking Motrin as needed. States she feels like the boot is rubbing.  No other issues noted.   Patient Active Problem List   Diagnosis Date Noted  . Anxiety 12/27/2016  . Benign essential hypertension 12/27/2016  . Depression 12/27/2016  . Graves disease 12/27/2016  . Palpitations 12/27/2016  . Adenocarcinoma of right lung, stage 1 ([place]) 07/26/2016    Current Outpatient Medications on File Prior to Visit  Medication Sig Dispense Refill  . Albuterol (VENTOLIN IN) Inhale into the lungs.    . ALPRAZolam (XANAX) 0.5 MG tablet Take 0.5 mg by mouth at bedtime as needed for anxiety.    Marland Kitchen aspirin 81 MG tablet Take 81 mg by mouth daily.    Marland Kitchen buPROPion (WELLBUTRIN XL) 150 MG 24 hr tablet Take 150 mg by mouth daily.    Marland Kitchen docusate sodium (COLACE) 100 MG capsule Take 100 mg by mouth 2 (two) times daily as needed for mild constipation.    Marland Kitchen levothyroxine (SYNTHROID, LEVOTHROID) 125 MCG tablet Take 125 mcg by mouth daily before breakfast.    . losartan (COZAAR) 50 MG tablet Take 50 mg by mouth daily.  0  . meloxicam (MOBIC) 15 MG tablet Take 1 tablet (15 mg total) by mouth daily. 30 tablet 0  . oxyCODONE-acetaminophen (PERCOCET) 10-325 MG tablet Take 1 tablet by mouth every 8 (eight) hours as needed for pain. 20 tablet 0  . pantoprazole (PROTONIX) 40 MG tablet Take 40 mg by mouth daily.    . promethazine (PHENERGAN) 25 MG tablet Take 25 mg by mouth every 8 (eight) hours as needed for nausea or vomiting.     No current facility-administered medications on file prior to visit.     Allergies  Allergen Reactions  .  Bactrim [Sulfamethoxazole-Trimethoprim] Itching  . Dextromethorphan Hbr Shortness Of Breath  . Keflex [Cephalexin] Shortness Of Breath    Hard time breathing  . Percocet [Oxycodone-Acetaminophen] Itching  . Hydrocodone Other (See Comments) and Anxiety    Heart racing, hard to concentrate, face flushed  . Hydroxyzine Other (See Comments) and Swelling    Numbness in lips  . Prednisone & Diphenhydramine Other (See Comments)    Face flushed, heart racing  . Prednisone Other (See Comments)  . Avelox [Moxifloxacin Hcl In Nacl] Rash  . Moxifloxacin Rash    Objective: There were no vitals filed for this visit.  General: No acute distress, AAOx3  Left foot/ankle: Remaining sutures intact with no gapping or dehiscence at surgical site, mild swelling to left medial foot and ankle, no erythema, no warmth, no drainage, no signs of infection noted, Capillary fill time <3 seconds in all digits, gross sensation present via light touch to left foot. No pain or crepitation with range of motion left foot however there is mild guarding.  No pain with calf compression.   Assessment and Plan:  Problem List Items Addressed This Visit    None    Visit Diagnoses    S/P foot surgery, left    -  Primary   Pain of left lower extremity          -Patient seen and evaluated -Remaining sutures  were removed -Applied dry sterile dressing to surgical site left foot secured with ACE wrap and stockinet  -Advised patient to make sure to keep dressings clean, dry, and intact to left surgical site, removing the ACE as needed for 1 week, after 1 week may remove and shower -Advised that she may now partially bear weight to left foot with Post op shoe since boot is rubbing, may wean from crutches to cane   -Advised patient to limit activity to necessity  -Advised patient to ice and elevate as necessary  -Continue with PRN meds -Will plan for xray and progressing patient off cane with full weight at next visit. In the  meantime, patient to call office if any issues or problems arise.   Landis Martins, DPM

## 2017-11-18 ENCOUNTER — Telehealth: Payer: Self-pay

## 2017-11-18 ENCOUNTER — Telehealth: Payer: Self-pay | Admitting: Medical Oncology

## 2017-11-18 ENCOUNTER — Other Ambulatory Visit: Payer: Self-pay | Admitting: Medical Oncology

## 2017-11-18 ENCOUNTER — Telehealth: Payer: Self-pay | Admitting: *Deleted

## 2017-11-18 NOTE — Telephone Encounter (Signed)
Pt is calling asking why to keep 12/5 appt without the scans? She would rather have the CT first and then see Dr Julien Nordmann.   She has new [employer] insurance cards.

## 2017-11-18 NOTE — Telephone Encounter (Signed)
Oncology Nurse Navigator Documentation  Oncology Nurse Navigator Flowsheets 11/18/2017  Navigator Location [facility]  Navigator Encounter Type Telephone/I looked at Ms. Roemmich's schedule. Per Dr. Worthy Flank note, she needs CT scan before her appt.  I called her so to help get it scheduled. I was unable to reach her. I did leave her a vm message to call with mane and phone number.   Telephone Outgoing Call  Treatment Phase Follow-up  Barriers/Navigation Needs Education;Coordination of Care  Education Other  Interventions Education;Coordination of Care  Coordination of Care Other  Education Method Verbal  Acuity Level 1  Time Spent with Patient 15

## 2017-11-18 NOTE — Telephone Encounter (Signed)
I left message tha tI will keep appt on dec 5 in case she gets scan on Monday or Tuesday .

## 2017-11-18 NOTE — Telephone Encounter (Signed)
Scan not scheduled. Sent to managed care. LM for pt Pt  to keep dec 5th appts.

## 2017-11-21 ENCOUNTER — Other Ambulatory Visit: Payer: Medicare Other

## 2017-11-22 ENCOUNTER — Ambulatory Visit (HOSPITAL_COMMUNITY)
Admission: RE | Admit: 2017-11-22 | Discharge: 2017-11-22 | Disposition: A | Discharge status: Home / Self Care | Payer: Medicare HMO | Source: Ambulatory Visit | Attending: Internal Medicine | Admitting: Internal Medicine

## 2017-11-22 ENCOUNTER — Ambulatory Visit (HOSPITAL_BASED_OUTPATIENT_CLINIC_OR_DEPARTMENT_OTHER): Payer: Medicare HMO

## 2017-11-22 ENCOUNTER — Telehealth: Payer: Self-pay | Admitting: Internal Medicine

## 2017-11-22 ENCOUNTER — Encounter (HOSPITAL_COMMUNITY): Payer: Self-pay | Admitting: Radiology

## 2017-11-22 DIAGNOSIS — C3491 Malignant neoplasm of unspecified part of right bronchus or lung: Secondary | ICD-10-CM

## 2017-11-22 DIAGNOSIS — C3411 Malignant neoplasm of upper lobe, right bronchus or lung: Secondary | ICD-10-CM | POA: Diagnosis not present

## 2017-11-22 DIAGNOSIS — I700 Atherosclerosis of aorta: Secondary | ICD-10-CM | POA: Insufficient documentation

## 2017-11-22 DIAGNOSIS — I2510 Atherosclerotic heart disease of native coronary artery without angina pectoris: Secondary | ICD-10-CM | POA: Insufficient documentation

## 2017-11-22 DIAGNOSIS — J439 Emphysema, unspecified: Secondary | ICD-10-CM | POA: Diagnosis not present

## 2017-11-22 LAB — CBC WITH DIFFERENTIAL/PLATELET
BASO%: 0.8 % (ref 0.0–2.0)
Basophils Absolute: 0 10*3/uL (ref 0.0–0.1)
EOS%: 1.7 % (ref 0.0–7.0)
Eosinophils Absolute: 0.1 10*3/uL (ref 0.0–0.5)
HEMATOCRIT: 37.8 % (ref 34.8–46.6)
HGB: 12 g/dL (ref 11.6–15.9)
LYMPH#: 1.3 10*3/uL (ref 0.9–3.3)
LYMPH%: 27.9 % (ref 14.0–49.7)
MCH: 27.3 pg (ref 25.1–34.0)
MCHC: 31.7 g/dL (ref 31.5–36.0)
MCV: 86.2 fL (ref 79.5–101.0)
MONO#: 0.4 10*3/uL (ref 0.1–0.9)
MONO%: 8.8 % (ref 0.0–14.0)
NEUT#: 2.8 10*3/uL (ref 1.5–6.5)
NEUT%: 60.8 % (ref 38.4–76.8)
Platelets: 274 10*3/uL (ref 145–400)
RBC: 4.39 10*6/uL (ref 3.70–5.45)
RDW: 13.5 % (ref 11.2–14.5)
WBC: 4.6 10*3/uL (ref 3.9–10.3)

## 2017-11-22 LAB — COMPREHENSIVE METABOLIC PANEL
ALBUMIN: 3.6 g/dL (ref 3.5–5.0)
ALK PHOS: 122 U/L (ref 40–150)
ALT: 13 U/L (ref 0–55)
ANION GAP: 7 meq/L (ref 3–11)
AST: 13 U/L (ref 5–34)
BUN: 14.5 mg/dL (ref 7.0–26.0)
CALCIUM: 9.1 mg/dL (ref 8.4–10.4)
CO2: 26 mEq/L (ref 22–29)
CREATININE: 0.8 mg/dL (ref 0.6–1.1)
Chloride: 107 mEq/L (ref 98–109)
EGFR: >60 mL/min/{1.73_m2} (ref >60)
Glucose: 91 mg/dl (ref 70–140)
POTASSIUM: 3.9 meq/L (ref 3.5–5.1)
Sodium: 140 mEq/L (ref 136–145)
Total Bilirubin: 0.48 mg/dL (ref 0.20–1.20)
Total Protein: 6.8 g/dL (ref 6.4–8.3)

## 2017-11-22 MED ORDER — IOPAMIDOL (ISOVUE-300) INJECTION 61%
INTRAVENOUS | Status: AC
  Administered 2017-11-22: 75 mL via INTRAVENOUS
  Filled 2017-11-22: qty 75

## 2017-11-22 NOTE — Telephone Encounter (Signed)
Scheduled lab appt per 12/3 sch message - left message with appt date and time.

## 2017-11-23 ENCOUNTER — Telehealth: Payer: Self-pay | Admitting: Internal Medicine

## 2017-11-23 ENCOUNTER — Other Ambulatory Visit: Payer: Medicare HMO

## 2017-11-23 ENCOUNTER — Ambulatory Visit (HOSPITAL_BASED_OUTPATIENT_CLINIC_OR_DEPARTMENT_OTHER): Payer: Medicare HMO | Admitting: Internal Medicine

## 2017-11-23 ENCOUNTER — Encounter: Payer: Self-pay | Admitting: Internal Medicine

## 2017-11-23 DIAGNOSIS — C3491 Malignant neoplasm of unspecified part of right bronchus or lung: Secondary | ICD-10-CM | POA: Diagnosis not present

## 2017-11-23 DIAGNOSIS — C3490 Malignant neoplasm of unspecified part of unspecified bronchus or lung: Secondary | ICD-10-CM

## 2017-11-23 DIAGNOSIS — I10 Essential (primary) hypertension: Secondary | ICD-10-CM | POA: Diagnosis not present

## 2017-11-23 NOTE — Telephone Encounter (Signed)
Scheduled appt per 12/5 los - Gave patient AVS and calender per los. Central radiology to contact patient with ct schedule.

## 2017-11-23 NOTE — Progress Notes (Signed)
[facility] [phone]   [phone]  OFFICE PROGRESS NOTE  Lillard Anes, MD [address] [place] [address]  DIAGNOSIS: stage IB (T2a, N0, M0) non-small cell lung cancer, adenocarcinoma diagnosed in July 2016  PRIOR THERAPY: 1) status post right upper lobectomy with lymph node dissection on 07/22/2015 in [place]. 2) adjuvant systemic chemotherapy with cisplatin and Alimta status post 4 cycles completed in November 2016.  CURRENT THERAPY: Observation.  INTERVAL HISTORY: Victoria Pena 66 y.o. female returns to the clinic today for 6 months follow-up visit accompanied by her daughter.  The patient is feeling fine today with no specific complaints.  She denied having any current chest pain, shortness breath, cough or hemoptysis.  She denied having any fever or chills.  She has no nausea, vomiting, diarrhea or constipation.  The patient had a repeat CT scan of the chest performed yesterday and she is here for evaluation and discussion of her discuss results.   MEDICAL HISTORY: Past Medical History:  Diagnosis Date  . Adenocarcinoma of right lung, stage 1 ([place]) 07/26/2016  . Anxiety   . Arthritis   . COPD (chronic obstructive pulmonary disease) ([place])   . Depression   . GERD (gastroesophageal reflux disease)   . Hypertension   . Thyroid disease     ALLERGIES:  is allergic to bactrim [sulfamethoxazole-trimethoprim]; dextromethorphan hbr; keflex [cephalexin]; percocet [oxycodone-acetaminophen]; hydrocodone; hydroxyzine; prednisone & diphenhydramine; prednisone; avelox [moxifloxacin hcl in nacl]; and moxifloxacin.  MEDICATIONS:  Current Outpatient Medications  Medication Sig Dispense Refill  . Albuterol (VENTOLIN IN) Inhale into the lungs.    . ALPRAZolam (XANAX) 0.5 MG tablet Take 0.5 mg by mouth at bedtime as needed for anxiety.    Marland Kitchen aspirin 81 MG tablet Take 81 mg by mouth daily.    Marland Kitchen buPROPion  (WELLBUTRIN XL) 150 MG 24 hr tablet Take 150 mg by mouth daily.    Marland Kitchen docusate sodium (COLACE) 100 MG capsule Take 100 mg by mouth 2 (two) times daily as needed for mild constipation.    Marland Kitchen levothyroxine (SYNTHROID, LEVOTHROID) 125 MCG tablet Take 125 mcg by mouth daily before breakfast.    . losartan (COZAAR) 50 MG tablet Take 50 mg by mouth daily.  0  . oxyCODONE-acetaminophen (PERCOCET) 10-325 MG tablet Take 1 tablet by mouth every 8 (eight) hours as needed for pain. 20 tablet 0  . pantoprazole (PROTONIX) 40 MG tablet Take 40 mg by mouth daily.     No current facility-administered medications for this visit.     SURGICAL HISTORY:  Past Surgical History:  Procedure Laterality Date  . APPENDECTOMY    . CHOLECYSTECTOMY    . THYROIDECTOMY      REVIEW OF SYSTEMS:  A comprehensive review of systems was negative.   PHYSICAL EXAMINATION: General appearance: alert, cooperative and no distress Head: Normocephalic, without obvious abnormality, atraumatic Neck: no adenopathy, no JVD, supple, symmetrical, trachea midline and thyroid not enlarged, symmetric, no tenderness/mass/nodules Lymph nodes: Cervical, supraclavicular, and axillary nodes normal. Resp: clear to auscultation bilaterally Back: symmetric, no curvature. ROM normal. No CVA tenderness. Cardio: regular rate and rhythm, S1, S2 normal, no murmur, click, rub or gallop GI: soft, non-tender; bowel sounds normal; no masses,  no organomegaly Extremities: extremities normal, atraumatic, no cyanosis or edema  ECOG PERFORMANCE STATUS: 1 - Symptomatic but completely ambulatory  Blood pressure 127/72, pulse 79, temperature 98.2 F (36.8 C), temperature source Oral, resp. rate 18, height 5'  4" (1.626 m), weight 181 lb 11.2 oz (82.4 kg), SpO2 97 %.  LABORATORY DATA: Lab Results  Component Value Date   WBC 4.6 11/22/2017   HGB 12.0 11/22/2017   HCT 37.8 11/22/2017   MCV 86.2 11/22/2017   PLT 274 11/22/2017      Chemistry        Component Value Date/Time   NA 140 11/22/2017 1451   K 3.9 11/22/2017 1451   CO2 26 11/22/2017 1451   BUN 14.5 11/22/2017 1451   CREATININE 0.8 11/22/2017 1451      Component Value Date/Time   CALCIUM 9.1 11/22/2017 1451   ALKPHOS 122 11/22/2017 1451   AST 13 11/22/2017 1451   ALT 13 11/22/2017 1451   BILITOT 0.48 11/22/2017 1451       RADIOGRAPHIC STUDIES: No results found.  ASSESSMENT AND PLAN:  This is a very pleasant 66 years old white female with a stage IB non-small cell lung cancer, adenocarcinoma status post right upper lobectomy with lymph node dissection followed by 4 cycles of adjuvant systemic chemotherapy with cisplatin and Alimta. The patient is currently on observation and she is feeling fine. She had a repeat CT scan of the chest performed yesterday.  I personally and independently reviewed the scan images and discussed the results and showed the images to the patient today.  I did not see any clear evidence for disease recurrence but the final report is still pending. I will call the patient if there is any concerning abnormalities and her final report. I will see her back for follow-up visit in 6 months for evaluation with repeat CT scan of the chest. She was advised to call immediately if she has any concerning symptoms in the interval. The patient voices understanding of current disease status and treatment options and is in agreement with the current care plan. All questions were answered. The patient knows to call the clinic with any problems, questions or concerns. We can certainly see the patient much sooner if necessary. I spent 10 minutes counseling the patient face to face. The total time spent in the appointment was 15 minutes. Disclaimer: This note was dictated with voice recognition software. Similar sounding words can inadvertently be transcribed and may not be corrected upon review.

## 2017-11-24 ENCOUNTER — Ambulatory Visit (INDEPENDENT_AMBULATORY_CARE_PROVIDER_SITE_OTHER): Payer: Medicare HMO

## 2017-11-24 ENCOUNTER — Ambulatory Visit (INDEPENDENT_AMBULATORY_CARE_PROVIDER_SITE_OTHER): Payer: Medicare HMO | Admitting: Sports Medicine

## 2017-11-24 ENCOUNTER — Encounter: Payer: Self-pay | Admitting: Sports Medicine

## 2017-11-24 DIAGNOSIS — M79605 Pain in left leg: Secondary | ICD-10-CM

## 2017-11-24 DIAGNOSIS — M66872 Spontaneous rupture of other tendons, left ankle and foot: Secondary | ICD-10-CM

## 2017-11-24 DIAGNOSIS — M25572 Pain in left ankle and joints of left foot: Secondary | ICD-10-CM

## 2017-11-24 DIAGNOSIS — Z9889 Other specified postprocedural states: Secondary | ICD-10-CM

## 2017-11-24 DIAGNOSIS — Q742 Other congenital malformations of lower limb(s), including pelvic girdle: Secondary | ICD-10-CM

## 2017-11-24 MED ORDER — LIDOCAINE 5 % EX PTCH: 1.0000 | MEDICATED_PATCH | CUTANEOUS | 0 refills | Status: DC

## 2017-11-24 NOTE — Progress Notes (Signed)
Subjective: Victoria Pena is a 66 y.o. female patient seen today in office for POV #4 (DOS 10-10-17), S/P Left PT tendon repair/advancement/kidner with anchor and removal of accessory nacvicular. Patient admits some pain at surgical site, states that once her foot turned the opposite direction and she felt a pull along the tendon and had increased pain. States that sometimes she still has swelling that get worse but seems like the compression sleeve helps.  Patient denies calf pain, denies headache, chest pain, shortness of breath, nausea, vomiting, fever, or chills. No other issues noted.   Patient Active Problem List   Diagnosis Date Noted  . Anxiety 12/27/2016  . Benign essential hypertension 12/27/2016  . Depression 12/27/2016  . Graves disease 12/27/2016  . Palpitations 12/27/2016  . Adenocarcinoma of right lung, stage 1 ([place]) 07/26/2016    Current Outpatient Medications on File Prior to Visit  Medication Sig Dispense Refill  . albuterol (PROVENTIL HFA;VENTOLIN HFA) 108 (90 Base) MCG/ACT inhaler Inhale into the lungs.    . Albuterol (VENTOLIN IN) Inhale into the lungs.    . ALPRAZolam (XANAX) 0.5 MG tablet Take 0.5 mg by mouth at bedtime as needed for anxiety.    Marland Kitchen aspirin 81 MG tablet Take 81 mg by mouth daily.    Marland Kitchen buPROPion (WELLBUTRIN XL) 150 MG 24 hr tablet Take 150 mg by mouth daily.    Marland Kitchen docusate sodium (COLACE) 100 MG capsule Take 100 mg by mouth 2 (two) times daily as needed for mild constipation.    Marland Kitchen levothyroxine (SYNTHROID, LEVOTHROID) 125 MCG tablet Take 125 mcg by mouth daily before breakfast.    . losartan (COZAAR) 50 MG tablet Take 50 mg by mouth daily.  0  . metroNIDAZOLE (METROGEL) 0.75 % gel APPLY THIN COAT TO AFFECTED AREA TWICE A DAY  5  . oxyCODONE-acetaminophen (PERCOCET) 10-325 MG tablet Take 1 tablet by mouth every 8 (eight) hours as needed for pain. 20 tablet 0  . pantoprazole (PROTONIX) 40 MG tablet Take 40 mg by mouth daily.    . traMADol (ULTRAM) 50 MG  tablet Take 50 mg by mouth every 8 (eight) hours as needed. for pain  3   No current facility-administered medications on file prior to visit.     Allergies  Allergen Reactions  . Bactrim [Sulfamethoxazole-Trimethoprim] Itching  . Dextromethorphan Hbr Shortness Of Breath  . Keflex [Cephalexin] Shortness Of Breath    Hard time breathing  . Percocet [Oxycodone-Acetaminophen] Itching  . Hydrocodone Other (See Comments) and Anxiety    Heart racing, hard to concentrate, face flushed  . Hydroxyzine Other (See Comments) and Swelling    Numbness in lips  . Prednisone & Diphenhydramine Other (See Comments)    Face flushed, heart racing  . Prednisone Other (See Comments)  . Avelox [Moxifloxacin Hcl In Nacl] Rash  . Moxifloxacin Rash    Objective: There were no vitals filed for this visit.  General: No acute distress, AAOx3  Left foot/ankle: Incision healing well at surgical site, mild swelling to left medial foot and ankle, no erythema, no warmth, no drainage, no signs of infection noted, Capillary fill time <3 seconds in all digits, gross sensation present via light touch to left foot. No pain or crepitation with range of motion left foot however there is mild guarding.  No pain with calf compression.   Xrays, left foot- no acute changes, consistent with post op status   Assessment and Plan:  Problem List Items Addressed This Visit    None  Visit Diagnoses    S/P foot surgery, left    -  Primary   Relevant Medications   lidocaine (LIDODERM) 5 %   Other Relevant Orders   DG Foot Complete Left   DME Other see comment   Pain of left lower extremity       Relevant Medications   lidocaine (LIDODERM) 5 %   Nontraumatic tear of tibialis posterior tendon, left       Accessory navicular bone of right foot       Acute left ankle pain          -Patient seen and evaluated -Applied offloading padding in post op shoe  -Dispensed surgitube compression sleeve -Rx Lidoderm pain patch   -Advised to use cane as Rx to help with stability when walking with post op shoe. Advised patient to use shoe for the next 2 weeks then after if pain and swelling is better may return to tennis shoe however if still bothersome continue with post op shoe -Advised patient to limit activity to necessity  -Advised patient to ice and elevate as necessary  -Continue with PRN meds -Will plan for progressing patient off cane and OUT of post op shoe if she is doing well at next visit. In the meantime, patient to call office if any issues or problems arise.   Landis Martins, DPM

## 2017-12-01 ENCOUNTER — Encounter: Payer: Self-pay | Admitting: Sports Medicine

## 2017-12-01 NOTE — Progress Notes (Signed)
Medical records requested by Lawernce Keas at [facility] was placed up front to be mailed out today, Thursday 01 December 2017.

## 2017-12-05 DIAGNOSIS — H8123 Vestibular neuronitis, bilateral: Secondary | ICD-10-CM | POA: Diagnosis not present

## 2017-12-20 HISTORY — PX: LUMBAR FUSION: SHX111

## 2017-12-22 ENCOUNTER — Encounter: Payer: Self-pay | Admitting: Sports Medicine

## 2017-12-28 ENCOUNTER — Encounter: Payer: Self-pay | Admitting: Sports Medicine

## 2017-12-30 DIAGNOSIS — Z1231 Encounter for screening mammogram for malignant neoplasm of breast: Secondary | ICD-10-CM | POA: Diagnosis not present

## 2017-12-30 DIAGNOSIS — R928 Other abnormal and inconclusive findings on diagnostic imaging of breast: Secondary | ICD-10-CM | POA: Diagnosis not present

## 2018-01-04 ENCOUNTER — Ambulatory Visit (INDEPENDENT_AMBULATORY_CARE_PROVIDER_SITE_OTHER): Payer: Medicare HMO | Admitting: Sports Medicine

## 2018-01-04 ENCOUNTER — Encounter: Payer: Self-pay | Admitting: Sports Medicine

## 2018-01-04 DIAGNOSIS — M25572 Pain in left ankle and joints of left foot: Secondary | ICD-10-CM

## 2018-01-04 DIAGNOSIS — Q742 Other congenital malformations of lower limb(s), including pelvic girdle: Secondary | ICD-10-CM

## 2018-01-04 DIAGNOSIS — Z9889 Other specified postprocedural states: Secondary | ICD-10-CM

## 2018-01-04 DIAGNOSIS — M79605 Pain in left leg: Secondary | ICD-10-CM

## 2018-01-04 DIAGNOSIS — M66872 Spontaneous rupture of other tendons, left ankle and foot: Secondary | ICD-10-CM

## 2018-01-04 MED ORDER — LIDOCAINE 5 % EX PTCH: 1.0000 | MEDICATED_PATCH | CUTANEOUS | 0 refills | Status: DC

## 2018-01-04 NOTE — Progress Notes (Addendum)
Subjective: Victoria Pena is a 67 y.o. female patient seen today in office for POV #5 (DOS 10-10-17), S/P Left PT tendon repair/advancement/kidner with anchor and removal of accessory nacvicular. Patient admits some pain at surgical site, states that she has swelling, pain with orthotics unable to wear them. Reports sharpe pain to surgical site. Can not walk without cane.  Patient denies calf pain, denies headache, chest pain, shortness of breath, nausea, vomiting, fever, or chills. No other issues noted.   Patient Active Problem List   Diagnosis Date Noted  . Anxiety 12/27/2016  . Benign essential hypertension 12/27/2016  . Depression 12/27/2016  . Graves disease 12/27/2016  . Palpitations 12/27/2016  . Adenocarcinoma of right lung, stage 1 ([place]) 07/26/2016    Current Outpatient Medications on File Prior to Visit  Medication Sig Dispense Refill  . albuterol (PROVENTIL HFA;VENTOLIN HFA) 108 (90 Base) MCG/ACT inhaler Inhale into the lungs.    . Albuterol (VENTOLIN IN) Inhale into the lungs.    . ALPRAZolam (XANAX) 0.5 MG tablet Take 0.5 mg by mouth at bedtime as needed for anxiety.    Marland Kitchen aspirin 81 MG tablet Take 81 mg by mouth daily.    Marland Kitchen buPROPion (WELLBUTRIN XL) 150 MG 24 hr tablet Take 150 mg by mouth daily.    Marland Kitchen docusate sodium (COLACE) 100 MG capsule Take 100 mg by mouth 2 (two) times daily as needed for mild constipation.    Marland Kitchen levothyroxine (SYNTHROID, LEVOTHROID) 125 MCG tablet Take 125 mcg by mouth daily before breakfast.    . lidocaine (LIDODERM) 5 % Place 1 patch onto the skin daily. Remove & Discard patch within 12 hours or as directed by MD 30 patch 0  . losartan (COZAAR) 50 MG tablet Take 50 mg by mouth daily.  0  . metroNIDAZOLE (METROGEL) 0.75 % gel APPLY THIN COAT TO AFFECTED AREA TWICE A DAY  5  . oxyCODONE-acetaminophen (PERCOCET) 10-325 MG tablet Take 1 tablet by mouth every 8 (eight) hours as needed for pain. 20 tablet 0  . pantoprazole (PROTONIX) 40 MG tablet Take 40  mg by mouth daily.    . traMADol (ULTRAM) 50 MG tablet Take 50 mg by mouth every 8 (eight) hours as needed. for pain  3   No current facility-administered medications on file prior to visit.     Allergies  Allergen Reactions  . Bactrim [Sulfamethoxazole-Trimethoprim] Itching  . Dextromethorphan Hbr Shortness Of Breath  . Keflex [Cephalexin] Shortness Of Breath    Hard time breathing  . Percocet [Oxycodone-Acetaminophen] Itching  . Hydrocodone Other (See Comments) and Anxiety    Heart racing, hard to concentrate, face flushed  . Hydroxyzine Other (See Comments) and Swelling    Numbness in lips  . Prednisone & Diphenhydramine Other (See Comments)    Face flushed, heart racing  . Prednisone Other (See Comments)  . Avelox [Moxifloxacin Hcl In Nacl] Rash  . Moxifloxacin Rash    Objective: There were no vitals filed for this visit.  General: No acute distress, AAOx3  Left foot/ankle: Incision well healed at surgical site, mild swelling to left medial foot and ankle, no erythema, no warmth, no drainage, no signs of infection noted, Capillary fill time <3 seconds in all digits, gross sensation present via light touch to left foot subjective sharp pains. No pain or crepitation with range of motion left foot however there is mild guarding with range of motion.  No pain with calf compression.    Assessment and Plan:  Problem List  Items Addressed This Visit    None    Visit Diagnoses    S/P foot surgery, left    -  Primary   Pain of left lower extremity       Nontraumatic tear of tibialis posterior tendon, left       Accessory navicular bone of right foot       Acute left ankle pain          -Patient seen and evaluated -Continue with good supportive shoes will have patient to meet with [facility] for consideration for new molds for orthotics vs bracing   -Advised patient to limit activity to necessity. Continue with cane. -Advised patient to ice and elevate as necessary -Refilled  Lidoderm patch to use since she never started/picked up the 1st Rx  -Refrain from work until patient can be more stable and stand and walk longer without pain  -Return for orthotic vs brace eval on 01/26/18. In the meantime, patient to call office if any issues or problems arise.   Landis Martins, DPM

## 2018-01-05 DIAGNOSIS — H903 Sensorineural hearing loss, bilateral: Secondary | ICD-10-CM | POA: Diagnosis not present

## 2018-01-05 DIAGNOSIS — G501 Atypical facial pain: Secondary | ICD-10-CM | POA: Diagnosis not present

## 2018-01-05 DIAGNOSIS — H9311 Tinnitus, right ear: Secondary | ICD-10-CM | POA: Diagnosis not present

## 2018-01-16 ENCOUNTER — Telehealth: Payer: Self-pay

## 2018-01-16 NOTE — Telephone Encounter (Signed)
LVM for patient informing her that her insurance denied the PA for lidoderm patch, and that Dr Cannon Kettle recommended OTC aspercreme patch

## 2018-01-20 NOTE — Progress Notes (Signed)
DOS 10/10/17 Lt posterior tibial tendon repair and advancement w/ removal of accessory navicular bone

## 2018-01-23 ENCOUNTER — Telehealth: Payer: Self-pay | Admitting: *Deleted

## 2018-01-23 NOTE — Telephone Encounter (Signed)
I informed pt the lidocaine had not been approved by [facility] and Dr. Cannon Kettle asked if she would like to try the [facility] compound. Pt stated no she was using the Aspercreme.

## 2018-01-23 NOTE — Telephone Encounter (Signed)
Yes we can let patient know that it was denied and see if she wants [employer] pain cream. If she does not want it from [employer] then she can get OTC aspercreme patch Thanks Dr. Cannon Kettle

## 2018-01-23 NOTE — Telephone Encounter (Signed)
[employer] denied coverage of Lidocaine appeal sent 01/20/2018.

## 2018-01-26 ENCOUNTER — Other Ambulatory Visit: Payer: Self-pay

## 2018-02-02 DIAGNOSIS — N3000 Acute cystitis without hematuria: Secondary | ICD-10-CM | POA: Diagnosis not present

## 2018-02-02 DIAGNOSIS — Z124 Encounter for screening for malignant neoplasm of cervix: Secondary | ICD-10-CM | POA: Diagnosis not present

## 2018-02-02 DIAGNOSIS — N8111 Cystocele, midline: Secondary | ICD-10-CM | POA: Diagnosis not present

## 2018-02-02 DIAGNOSIS — Z1151 Encounter for screening for human papillomavirus (HPV): Secondary | ICD-10-CM | POA: Diagnosis not present

## 2018-02-02 DIAGNOSIS — K648 Other hemorrhoids: Secondary | ICD-10-CM | POA: Diagnosis not present

## 2018-02-10 DIAGNOSIS — R921 Mammographic calcification found on diagnostic imaging of breast: Secondary | ICD-10-CM | POA: Diagnosis not present

## 2018-02-10 DIAGNOSIS — R928 Other abnormal and inconclusive findings on diagnostic imaging of breast: Secondary | ICD-10-CM | POA: Diagnosis not present

## 2018-03-02 ENCOUNTER — Other Ambulatory Visit: Payer: Self-pay

## 2018-03-21 DIAGNOSIS — M4316 Spondylolisthesis, lumbar region: Secondary | ICD-10-CM | POA: Diagnosis not present

## 2018-04-04 DIAGNOSIS — M4316 Spondylolisthesis, lumbar region: Secondary | ICD-10-CM | POA: Diagnosis not present

## 2018-04-04 DIAGNOSIS — M545 Low back pain: Secondary | ICD-10-CM | POA: Diagnosis not present

## 2018-04-05 ENCOUNTER — Ambulatory Visit: Payer: Medicare HMO | Admitting: Sports Medicine

## 2018-04-05 ENCOUNTER — Ambulatory Visit (INDEPENDENT_AMBULATORY_CARE_PROVIDER_SITE_OTHER): Payer: Medicare HMO

## 2018-04-05 ENCOUNTER — Other Ambulatory Visit: Payer: Self-pay | Admitting: Sports Medicine

## 2018-04-05 ENCOUNTER — Ambulatory Visit: Payer: Self-pay

## 2018-04-05 ENCOUNTER — Encounter: Payer: Self-pay | Admitting: Sports Medicine

## 2018-04-05 DIAGNOSIS — M79671 Pain in right foot: Secondary | ICD-10-CM | POA: Diagnosis not present

## 2018-04-05 DIAGNOSIS — M2141 Flat foot [pes planus] (acquired), right foot: Secondary | ICD-10-CM

## 2018-04-05 DIAGNOSIS — M7752 Other enthesopathy of left foot: Secondary | ICD-10-CM | POA: Diagnosis not present

## 2018-04-05 DIAGNOSIS — M25572 Pain in left ankle and joints of left foot: Secondary | ICD-10-CM | POA: Diagnosis not present

## 2018-04-05 DIAGNOSIS — M2142 Flat foot [pes planus] (acquired), left foot: Secondary | ICD-10-CM

## 2018-04-05 DIAGNOSIS — M779 Enthesopathy, unspecified: Secondary | ICD-10-CM

## 2018-04-05 DIAGNOSIS — M79672 Pain in left foot: Secondary | ICD-10-CM

## 2018-04-05 DIAGNOSIS — Z9889 Other specified postprocedural states: Secondary | ICD-10-CM

## 2018-04-05 DIAGNOSIS — M25571 Pain in right ankle and joints of right foot: Secondary | ICD-10-CM | POA: Diagnosis not present

## 2018-04-05 MED ORDER — OXYCODONE-ACETAMINOPHEN 7.5-325 MG PO TABS: 1.0000 | ORAL_TABLET | Freq: Four times a day (QID) | ORAL | 0 refills | Status: DC | PRN

## 2018-04-05 MED ORDER — MELOXICAM 15 MG PO TABS: 15.0000 mg | ORAL_TABLET | Freq: Every day | ORAL | 0 refills | Status: DC

## 2018-04-05 NOTE — Progress Notes (Signed)
Subjective: Victoria Pena is a 67 y.o. female patient seen today in office for POV #6 (DOS 10-10-17), S/P Left PT tendon repair/advancement/kidner with anchor and removal of accessory nacvicular. Patient admits worsening foot pain 9 out of 10 sharp at the inside of the ankles on both feet however the left is slightly worse than the right as well as pain across the top of the foot and ankle states that even when sitting or at rest she has pain pain is constant does not is up it is very hard to stand or walk due to pain patient feels like she is barely making it reports that she has tried adding additional over-the-counter Dr. Felicie Morn cushioning to her shoes with no additional relief has also tried icing elevation ibuprofen and Aspercreme with no relief reports that her feet hurt.  Patient reschedule appointment several time and did not meet with our orthotist for a custom brace due to a family death.  Patient denies calf pain, denies headache, chest pain, shortness of breath, nausea, vomiting, fever, or chills. No other issues noted.   Patient Active Problem List   Diagnosis Date Noted  . Anxiety 12/27/2016  . Benign essential hypertension 12/27/2016  . Depression 12/27/2016  . Graves disease 12/27/2016  . Palpitations 12/27/2016  . Adenocarcinoma of right lung, stage 1 ([place]) 07/26/2016    Current Outpatient Medications on File Prior to Visit  Medication Sig Dispense Refill  . albuterol (PROVENTIL HFA;VENTOLIN HFA) 108 (90 Base) MCG/ACT inhaler Inhale into the lungs.    . Albuterol (VENTOLIN IN) Inhale into the lungs.    . ALPRAZolam (XANAX) 0.5 MG tablet Take 0.5 mg by mouth at bedtime as needed for anxiety.    Marland Kitchen aspirin 81 MG tablet Take 81 mg by mouth daily.    Marland Kitchen buPROPion (WELLBUTRIN XL) 150 MG 24 hr tablet Take 150 mg by mouth daily.    Marland Kitchen docusate sodium (COLACE) 100 MG capsule Take 100 mg by mouth 2 (two) times daily as needed for mild constipation.    Marland Kitchen levothyroxine (SYNTHROID,  LEVOTHROID) 125 MCG tablet Take 125 mcg by mouth daily before breakfast.    . lidocaine (LIDODERM) 5 % Place 1 patch onto the skin daily. Remove & Discard patch within 12 hours or as directed by MD 30 patch 0  . losartan (COZAAR) 50 MG tablet Take 50 mg by mouth daily.  0  . metroNIDAZOLE (METROGEL) 0.75 % gel APPLY THIN COAT TO AFFECTED AREA TWICE A DAY  5  . pantoprazole (PROTONIX) 40 MG tablet Take 40 mg by mouth daily.    . traMADol (ULTRAM) 50 MG tablet Take 50 mg by mouth every 8 (eight) hours as needed. for pain  3   No current facility-administered medications on file prior to visit.     Allergies  Allergen Reactions  . Bactrim [Sulfamethoxazole-Trimethoprim] Itching  . Dextromethorphan Hbr Shortness Of Breath  . Keflex [Cephalexin] Shortness Of Breath    Hard time breathing  . Percocet [Oxycodone-Acetaminophen] Itching  . Hydrocodone Other (See Comments) and Anxiety    Heart racing, hard to concentrate, face flushed  . Hydroxyzine Other (See Comments) and Swelling    Numbness in lips  . Prednisone & Diphenhydramine Other (See Comments)    Face flushed, heart racing  . Prednisone Other (See Comments)  . Avelox [Moxifloxacin Hcl In Nacl] Rash  . Moxifloxacin Rash    Objective: There were no vitals filed for this visit.  General: No acute distress, AAOx3  Right  and left foot/ankle: Incisions well healed at surgical site, mild swelling to left medial foot and ankle greater than the right, no erythema, no warmth, no drainage, no signs of infection noted, Capillary fill time <3 seconds in all digits, gross sensation present via light touch to left foot subjective sharp pains and pains with range of motion on both feet and ankles.  Pain is diffuse.  No pain with calf compression.   X-rays reviewed with midtarsal breech and pes planus with forefoot abductus noted surgical sites appear to be within normal limits, no fracture, no other acute findings.   Assessment and Plan:   Problem List Items Addressed This Visit    None    Visit Diagnoses    S/P foot surgery, left    -  Primary   Relevant Orders   DG Foot Complete Right   Foot pain, bilateral       Relevant Orders   DG Foot Complete Right   Tendonitis       Relevant Orders   DG Foot Complete Right   Pes planus of both feet       Relevant Orders   DG Foot Complete Right   Arthralgia of both feet          -Patient seen and evaluated -X-rays reviewed  -Applied soft cast to left foot and ankle to assist with edema and pain control -Applied heel lift to right tennis shoe -Prescribed meloxicam to take in the morning for pain and inflammation advised patient if she still has breakthrough pain or pain not relieved by the meloxicam 4 hours later that she may take Percocet for more severe pain -Advised patient to limit activity to necessity. Continue with cane. -Advised patient to ice and elevate as necessary and topical pain cream as needed -Advised patient that she should get a follow-up appointment with [facility] for evaluation for braces to help control severe flatfoot patient reports that she will coordinate this appointment around the timeframe of which she is supposed to have back surgery as well sometime in May -Return for brace eval with [facility] or sooner if issues or problems arise.  Landis Martins, DPM

## 2018-04-12 DIAGNOSIS — E038 Other specified hypothyroidism: Secondary | ICD-10-CM | POA: Diagnosis not present

## 2018-04-12 DIAGNOSIS — F064 Anxiety disorder due to known physiological condition: Secondary | ICD-10-CM | POA: Diagnosis not present

## 2018-04-12 DIAGNOSIS — K219 Gastro-esophageal reflux disease without esophagitis: Secondary | ICD-10-CM | POA: Diagnosis not present

## 2018-04-12 DIAGNOSIS — I10 Essential (primary) hypertension: Secondary | ICD-10-CM | POA: Diagnosis not present

## 2018-04-14 ENCOUNTER — Ambulatory Visit: Payer: Self-pay | Admitting: Sports Medicine

## 2018-04-18 ENCOUNTER — Other Ambulatory Visit: Payer: Self-pay | Admitting: Neurology

## 2018-04-18 DIAGNOSIS — Z6830 Body mass index (BMI) 30.0-30.9, adult: Secondary | ICD-10-CM | POA: Diagnosis not present

## 2018-04-18 DIAGNOSIS — M8580 Other specified disorders of bone density and structure, unspecified site: Secondary | ICD-10-CM

## 2018-04-18 DIAGNOSIS — I10 Essential (primary) hypertension: Secondary | ICD-10-CM | POA: Diagnosis not present

## 2018-04-18 DIAGNOSIS — M4316 Spondylolisthesis, lumbar region: Secondary | ICD-10-CM | POA: Diagnosis not present

## 2018-04-19 ENCOUNTER — Other Ambulatory Visit: Payer: Self-pay | Admitting: Neurological Surgery

## 2018-04-25 ENCOUNTER — Ambulatory Visit (HOSPITAL_BASED_OUTPATIENT_CLINIC_OR_DEPARTMENT_OTHER)
Admission: RE | Admit: 2018-04-25 | Discharge: 2018-04-25 | Disposition: A | Discharge status: Home / Self Care | Payer: Medicare HMO | Source: Ambulatory Visit | Attending: Neurology | Admitting: Neurology

## 2018-04-25 DIAGNOSIS — M810 Age-related osteoporosis without current pathological fracture: Secondary | ICD-10-CM | POA: Diagnosis not present

## 2018-04-25 DIAGNOSIS — Z780 Asymptomatic menopausal state: Secondary | ICD-10-CM | POA: Diagnosis not present

## 2018-04-25 DIAGNOSIS — M8580 Other specified disorders of bone density and structure, unspecified site: Secondary | ICD-10-CM | POA: Diagnosis present

## 2018-04-28 ENCOUNTER — Telehealth: Payer: Self-pay | Admitting: Internal Medicine

## 2018-04-28 NOTE — Telephone Encounter (Signed)
Patient called to cancel °

## 2018-05-02 NOTE — Pre-Procedure Instructions (Signed)
Victoria Pena [facility]  05/02/2018      [facility] [place], [place] - [address] AT [address] [place] [address] Phone: [phone] Fax: [phone]    Your procedure is scheduled on May 23.  Report to [facility] Admitting at 815 A.M.  Call this number if you have problems the morning of surgery:  [phone]   Remember:  Do not eat food or drink liquids after midnight.  Take these medicines the morning of surgery with A SIP OF WATER  Tylenol if needed Albuterol inhaler if needed Alprazolam (Xanax) Bupropion (Wellbutrin XL) Levothyroxine (Synthroid) Oxycodone (Percocet) if needed Refresh eye drops if needed  Bring your inhalers with you on the day of surgery  Stop taking aspirin as directed by your Dr.   Stop taking BC's, Goody's, Herbal medications, Fish Oil, Aleve, Ibuprofen, Advil, Motrin, Vitamins, Meloxicam or Mobic    Do not wear jewelry, make-up or nail polish.  Do not wear lotions, powders, or perfumes, or deodorant.  Do not shave 48 hours prior to surgery.  Men may shave face and neck.  Do not bring valuables to the hospital.  [facility] is not responsible for any belongings or valuables.  Contacts, dentures or bridgework may not be worn into surgery.  Leave your suitcase in the car.  After surgery it may be brought to your room.  For patients admitted to the hospital, discharge time will be determined by your treatment team.  Patients discharged the day of surgery will not be allowed to drive home.   Special instructions:  [facility]- Preparing For Surgery  Before surgery, you can play an important role. Because skin is not sterile, your skin needs to be as free of germs as possible. You can reduce the number of germs on your skin by washing with CHG (chlorahexidine gluconate) Soap before surgery.  CHG is an antiseptic cleaner which kills germs and bonds with the skin  to continue killing germs even after washing.  Oral Hygiene is also important to reduce your risk of infection.  Remember - BRUSH YOUR TEETH THE MORNING OF SURGERY  Please do not use if you have an allergy to CHG or antibacterial soaps. If your skin becomes reddened/irritated stop using the CHG.  Do not shave (including legs and underarms) for at least 48 hours prior to first CHG shower. It is OK to shave your face.  Please follow these instructions carefully.   1. Shower the NIGHT BEFORE SURGERY and the MORNING OF SURGERY with CHG.   2. If you chose to wash your hair, wash your hair first as usual with your normal shampoo.  3. After you shampoo, rinse your hair and body thoroughly to remove the shampoo.  4. Use CHG as you would any other liquid soap. You can apply CHG directly to the skin and wash gently with a scrungie or a clean washcloth.   5. Apply the CHG Soap to your body ONLY FROM THE NECK DOWN.  Do not use on open wounds or open sores. Avoid contact with your eyes, ears, mouth and genitals (private parts). Wash Face and genitals (private parts)  with your normal soap.  6. Wash thoroughly, paying special attention to the area where your surgery will be performed.  7. Thoroughly rinse your body with warm water from the neck down.  8. DO NOT shower/wash with your normal soap after using and  rinsing off the CHG Soap.  9. Pat yourself dry with a CLEAN TOWEL.  10. Wear CLEAN PAJAMAS to bed the night before surgery, wear comfortable clothes the morning of surgery  11. Place CLEAN SHEETS on your bed the night of your first shower and DO NOT SLEEP WITH PETS.    Day of Surgery:  Do not apply any deodorants/lotions.  Please wear clean clothes to the hospital/surgery center.   Remember to brush your teeth.      Please read over the following fact sheets that you were given. Pain Booklet, Coughing and Deep Breathing, MRSA Information and Surgical Site Infection  Prevention

## 2018-05-03 ENCOUNTER — Encounter (HOSPITAL_COMMUNITY)
Admission: RE | Admit: 2018-05-03 | Discharge: 2018-05-03 | Disposition: A | Discharge status: Home / Self Care | Payer: Medicare HMO | Source: Ambulatory Visit | Attending: Neurological Surgery | Admitting: Neurological Surgery

## 2018-05-03 ENCOUNTER — Other Ambulatory Visit: Payer: Self-pay

## 2018-05-03 ENCOUNTER — Encounter (HOSPITAL_COMMUNITY): Payer: Self-pay

## 2018-05-03 ENCOUNTER — Ambulatory Visit (HOSPITAL_COMMUNITY)
Admission: RE | Admit: 2018-05-03 | Discharge: 2018-05-03 | Disposition: A | Discharge status: Home / Self Care | Payer: Medicare HMO | Source: Ambulatory Visit | Attending: Neurological Surgery | Admitting: Neurological Surgery

## 2018-05-03 DIAGNOSIS — G4730 Sleep apnea, unspecified: Secondary | ICD-10-CM | POA: Diagnosis not present

## 2018-05-03 DIAGNOSIS — Z79899 Other long term (current) drug therapy: Secondary | ICD-10-CM | POA: Diagnosis not present

## 2018-05-03 DIAGNOSIS — Z791 Long term (current) use of non-steroidal anti-inflammatories (NSAID): Secondary | ICD-10-CM | POA: Diagnosis not present

## 2018-05-03 DIAGNOSIS — E079 Disorder of thyroid, unspecified: Secondary | ICD-10-CM | POA: Insufficient documentation

## 2018-05-03 DIAGNOSIS — F329 Major depressive disorder, single episode, unspecified: Secondary | ICD-10-CM | POA: Insufficient documentation

## 2018-05-03 DIAGNOSIS — I10 Essential (primary) hypertension: Secondary | ICD-10-CM | POA: Insufficient documentation

## 2018-05-03 DIAGNOSIS — K219 Gastro-esophageal reflux disease without esophagitis: Secondary | ICD-10-CM | POA: Insufficient documentation

## 2018-05-03 DIAGNOSIS — Z7982 Long term (current) use of aspirin: Secondary | ICD-10-CM | POA: Diagnosis not present

## 2018-05-03 DIAGNOSIS — J449 Chronic obstructive pulmonary disease, unspecified: Secondary | ICD-10-CM | POA: Diagnosis not present

## 2018-05-03 DIAGNOSIS — M4310 Spondylolisthesis, site unspecified: Secondary | ICD-10-CM

## 2018-05-03 DIAGNOSIS — Z79890 Hormone replacement therapy (postmenopausal): Secondary | ICD-10-CM | POA: Insufficient documentation

## 2018-05-03 DIAGNOSIS — F419 Anxiety disorder, unspecified: Secondary | ICD-10-CM | POA: Diagnosis not present

## 2018-05-03 DIAGNOSIS — Z87891 Personal history of nicotine dependence: Secondary | ICD-10-CM | POA: Diagnosis not present

## 2018-05-03 DIAGNOSIS — Z01810 Encounter for preprocedural cardiovascular examination: Secondary | ICD-10-CM | POA: Insufficient documentation

## 2018-05-03 DIAGNOSIS — M4326 Fusion of spine, lumbar region: Secondary | ICD-10-CM | POA: Diagnosis not present

## 2018-05-03 HISTORY — DX: Sleep apnea, unspecified: G47.30

## 2018-05-03 LAB — CBC WITH DIFFERENTIAL/PLATELET
Abs Immature Granulocytes: 0 10*3/uL (ref 0.0–0.1)
BASOS ABS: 0 10*3/uL (ref 0.0–0.1)
Basophils Relative: 1 %
Eosinophils Absolute: 0.1 10*3/uL (ref 0.0–0.7)
Eosinophils Relative: 2 %
HCT: 41.5 % (ref 36.0–46.0)
HEMOGLOBIN: 12.5 g/dL (ref 12.0–15.0)
Immature Granulocytes: 0 %
LYMPHS PCT: 27 %
Lymphs Abs: 1.3 10*3/uL (ref 0.7–4.0)
MCH: 25.7 pg — AB (ref 26.0–34.0)
MCHC: 30.1 g/dL (ref 30.0–36.0)
MCV: 85.2 fL (ref 78.0–100.0)
MONO ABS: 0.4 10*3/uL (ref 0.1–1.0)
MONOS PCT: 9 %
Neutro Abs: 2.9 10*3/uL (ref 1.7–7.7)
Neutrophils Relative %: 61 %
Platelets: 265 10*3/uL (ref 150–400)
RBC: 4.87 MIL/uL (ref 3.87–5.11)
RDW: 15.9 % — ABNORMAL HIGH (ref 11.5–15.5)
WBC: 4.7 10*3/uL (ref 4.0–10.5)

## 2018-05-03 LAB — TYPE AND SCREEN
ABO/RH(D): O POS
Antibody Screen: NEGATIVE

## 2018-05-03 LAB — BASIC METABOLIC PANEL
Anion gap: 6 (ref 5–15)
BUN: 15 mg/dL (ref 6–20)
CALCIUM: 9.1 mg/dL (ref 8.9–10.3)
CO2: 28 mmol/L (ref 22–32)
Chloride: 107 mmol/L (ref 101–111)
Creatinine, Ser: 0.71 mg/dL (ref 0.44–1.00)
GFR calc Af Amer: >60 mL/min (ref >60)
GFR calc non Af Amer: >60 mL/min (ref >60)
GLUCOSE: 97 mg/dL (ref 65–99)
Potassium: 4 mmol/L (ref 3.5–5.1)
Sodium: 141 mmol/L (ref 135–145)

## 2018-05-03 LAB — SURGICAL PCR SCREEN
MRSA, PCR: NEGATIVE
Staphylococcus aureus: NEGATIVE

## 2018-05-03 LAB — PROTIME-INR
INR: 0.95
PROTHROMBIN TIME: 12.6 s (ref 11.4–15.2)

## 2018-05-03 LAB — ABO/RH: ABO/RH(D): O POS

## 2018-05-03 NOTE — Progress Notes (Signed)
PCP - Reinaldo Meeker Cardiologist - denies  Chest x-ray - 05/03/2018  EKG - 05/03/2018   Aspirin Instructions: given per Dr. Ronnald Ramp pt states if she can't find this info, she is to call Dr. Ronnald Ramp' office  Patient denies shortness of breath, fever, cough and chest pain at PAT appointment   Patient verbalized understanding of instructions that were given to them at the PAT appointment. Patient was also instructed that they will need to review over the PAT instructions again at home before surgery.

## 2018-05-09 DIAGNOSIS — N390 Urinary tract infection, site not specified: Secondary | ICD-10-CM | POA: Diagnosis not present

## 2018-05-09 DIAGNOSIS — N3000 Acute cystitis without hematuria: Secondary | ICD-10-CM | POA: Diagnosis not present

## 2018-05-11 ENCOUNTER — Inpatient Hospital Stay (HOSPITAL_COMMUNITY)
Admission: RE | Disposition: A | Discharge status: Home / Self Care | Payer: Self-pay | Source: Home / Self Care | Attending: Neurological Surgery

## 2018-05-11 ENCOUNTER — Inpatient Hospital Stay (HOSPITAL_COMMUNITY): Payer: Medicare HMO | Admitting: Certified Registered Nurse Anesthetist

## 2018-05-11 ENCOUNTER — Other Ambulatory Visit: Payer: Self-pay

## 2018-05-11 ENCOUNTER — Encounter (HOSPITAL_COMMUNITY): Payer: Self-pay | Admitting: Neurological Surgery

## 2018-05-11 ENCOUNTER — Inpatient Hospital Stay (HOSPITAL_COMMUNITY)
Admission: RE | Admit: 2018-05-11 | Discharge: 2018-05-13 | DRG: 455 | Disposition: A | Discharge status: Home / Self Care | Payer: Medicare HMO | Attending: Neurosurgery | Admitting: Neurosurgery

## 2018-05-11 ENCOUNTER — Inpatient Hospital Stay (HOSPITAL_COMMUNITY): Payer: Medicare HMO

## 2018-05-11 DIAGNOSIS — E890 Postprocedural hypothyroidism: Secondary | ICD-10-CM | POA: Diagnosis not present

## 2018-05-11 DIAGNOSIS — M48061 Spinal stenosis, lumbar region without neurogenic claudication: Secondary | ICD-10-CM | POA: Diagnosis not present

## 2018-05-11 DIAGNOSIS — Z87891 Personal history of nicotine dependence: Secondary | ICD-10-CM

## 2018-05-11 DIAGNOSIS — Z85118 Personal history of other malignant neoplasm of bronchus and lung: Secondary | ICD-10-CM

## 2018-05-11 DIAGNOSIS — J449 Chronic obstructive pulmonary disease, unspecified: Secondary | ICD-10-CM | POA: Diagnosis not present

## 2018-05-11 DIAGNOSIS — G4730 Sleep apnea, unspecified: Secondary | ICD-10-CM | POA: Diagnosis present

## 2018-05-11 DIAGNOSIS — F329 Major depressive disorder, single episode, unspecified: Secondary | ICD-10-CM | POA: Diagnosis present

## 2018-05-11 DIAGNOSIS — M545 Low back pain: Secondary | ICD-10-CM | POA: Diagnosis present

## 2018-05-11 DIAGNOSIS — I10 Essential (primary) hypertension: Secondary | ICD-10-CM | POA: Diagnosis not present

## 2018-05-11 DIAGNOSIS — Z885 Allergy status to narcotic agent status: Secondary | ICD-10-CM

## 2018-05-11 DIAGNOSIS — Z809 Family history of malignant neoplasm, unspecified: Secondary | ICD-10-CM

## 2018-05-11 DIAGNOSIS — Z79890 Hormone replacement therapy (postmenopausal): Secondary | ICD-10-CM

## 2018-05-11 DIAGNOSIS — Z79899 Other long term (current) drug therapy: Secondary | ICD-10-CM | POA: Diagnosis not present

## 2018-05-11 DIAGNOSIS — M4316 Spondylolisthesis, lumbar region: Secondary | ICD-10-CM | POA: Diagnosis present

## 2018-05-11 DIAGNOSIS — Z882 Allergy status to sulfonamides status: Secondary | ICD-10-CM

## 2018-05-11 DIAGNOSIS — Z888 Allergy status to other drugs, medicaments and biological substances status: Secondary | ICD-10-CM

## 2018-05-11 DIAGNOSIS — M1990 Unspecified osteoarthritis, unspecified site: Secondary | ICD-10-CM | POA: Diagnosis present

## 2018-05-11 DIAGNOSIS — F419 Anxiety disorder, unspecified: Secondary | ICD-10-CM | POA: Diagnosis present

## 2018-05-11 DIAGNOSIS — K219 Gastro-esophageal reflux disease without esophagitis: Secondary | ICD-10-CM | POA: Diagnosis not present

## 2018-05-11 DIAGNOSIS — Z886 Allergy status to analgesic agent status: Secondary | ICD-10-CM | POA: Diagnosis not present

## 2018-05-11 DIAGNOSIS — Z881 Allergy status to other antibiotic agents status: Secondary | ICD-10-CM | POA: Diagnosis not present

## 2018-05-11 DIAGNOSIS — Z981 Arthrodesis status: Secondary | ICD-10-CM

## 2018-05-11 DIAGNOSIS — Z96652 Presence of left artificial knee joint: Secondary | ICD-10-CM | POA: Diagnosis present

## 2018-05-11 DIAGNOSIS — M4326 Fusion of spine, lumbar region: Secondary | ICD-10-CM | POA: Diagnosis not present

## 2018-05-11 DIAGNOSIS — Z7982 Long term (current) use of aspirin: Secondary | ICD-10-CM | POA: Diagnosis not present

## 2018-05-11 DIAGNOSIS — E0590 Thyrotoxicosis, unspecified without thyrotoxic crisis or storm: Secondary | ICD-10-CM | POA: Diagnosis not present

## 2018-05-11 DIAGNOSIS — Z419 Encounter for procedure for purposes other than remedying health state, unspecified: Secondary | ICD-10-CM

## 2018-05-11 HISTORY — DX: Arthrodesis status: Z98.1

## 2018-05-11 SURGERY — POSTERIOR LUMBAR FUSION 2 LEVEL
Anesthesia: General | Site: Back

## 2018-05-11 MED ORDER — SODIUM CHLORIDE 0.9% FLUSH
3.0000 mL | Freq: Two times a day (BID) | INTRAVENOUS | Status: DC
  Administered 2018-05-11: 3 mL via INTRAVENOUS

## 2018-05-11 MED ORDER — THROMBIN 5000 UNITS EX SOLR
CUTANEOUS | Status: AC
  Filled 2018-05-11: qty 5000

## 2018-05-11 MED ORDER — PROPOFOL 10 MG/ML IV BOLUS
INTRAVENOUS | Status: DC | PRN
  Administered 2018-05-11: 160 mg via INTRAVENOUS

## 2018-05-11 MED ORDER — VANCOMYCIN HCL 1000 MG IV SOLR
INTRAVENOUS | Status: AC
  Filled 2018-05-11: qty 1000

## 2018-05-11 MED ORDER — THROMBIN 20000 UNITS EX SOLR
CUTANEOUS | Status: AC
  Filled 2018-05-11: qty 20000

## 2018-05-11 MED ORDER — SODIUM CHLORIDE 0.9 % IV SOLN
INTRAVENOUS | Status: DC | PRN
  Administered 2018-05-11: 10:00:00

## 2018-05-11 MED ORDER — ASPIRIN EC 81 MG PO TBEC
81.0000 mg | DELAYED_RELEASE_TABLET | Freq: Every day | ORAL | Status: DC
  Filled 2018-05-11 (×2): qty 1

## 2018-05-11 MED ORDER — FENTANYL CITRATE (PF) 100 MCG/2ML IJ SOLN
25.0000 ug | INTRAMUSCULAR | Status: DC | PRN
  Administered 2018-05-11: 25 ug via INTRAVENOUS
  Administered 2018-05-11: 50 ug via INTRAVENOUS

## 2018-05-11 MED ORDER — HEPARIN SODIUM (PORCINE) 1000 UNIT/ML IJ SOLN
INTRAMUSCULAR | Status: DC | PRN
  Administered 2018-05-11: 5000 [IU]

## 2018-05-11 MED ORDER — SODIUM CHLORIDE 0.9 % IV SOLN: 250.0000 mL | INTRAVENOUS | Status: DC

## 2018-05-11 MED ORDER — AMPICILLIN 500 MG PO CAPS
500.0000 mg | ORAL_CAPSULE | Freq: Four times a day (QID) | ORAL | Status: DC
  Administered 2018-05-11 – 2018-05-13 (×6): 500 mg via ORAL
  Filled 2018-05-11 (×8): qty 1

## 2018-05-11 MED ORDER — MIDAZOLAM HCL 2 MG/2ML IJ SOLN
INTRAMUSCULAR | Status: AC
  Filled 2018-05-11: qty 2

## 2018-05-11 MED ORDER — METHOCARBAMOL 500 MG PO TABS
500.0000 mg | ORAL_TABLET | Freq: Four times a day (QID) | ORAL | Status: DC | PRN
  Administered 2018-05-11 – 2018-05-13 (×7): 500 mg via ORAL
  Filled 2018-05-11 (×7): qty 1

## 2018-05-11 MED ORDER — LIDOCAINE 2% (20 MG/ML) 5 ML SYRINGE
INTRAMUSCULAR | Status: AC
  Filled 2018-05-11: qty 5

## 2018-05-11 MED ORDER — OXYCODONE-ACETAMINOPHEN 7.5-325 MG PO TABS
ORAL_TABLET | ORAL | Status: AC
  Filled 2018-05-11: qty 1

## 2018-05-11 MED ORDER — ONDANSETRON HCL 4 MG PO TABS
4.0000 mg | ORAL_TABLET | Freq: Four times a day (QID) | ORAL | Status: DC | PRN
  Administered 2018-05-12 – 2018-05-13 (×4): 4 mg via ORAL
  Filled 2018-05-11 (×4): qty 1

## 2018-05-11 MED ORDER — SUGAMMADEX SODIUM 200 MG/2ML IV SOLN
INTRAVENOUS | Status: AC
  Filled 2018-05-11: qty 2

## 2018-05-11 MED ORDER — HYDROMORPHONE HCL 1 MG/ML IJ SOLN
0.5000 mg | INTRAMUSCULAR | Status: DC | PRN
  Administered 2018-05-11 (×2): 0.5 mg via INTRAVENOUS
  Filled 2018-05-11 (×2): qty 0.5

## 2018-05-11 MED ORDER — PANTOPRAZOLE SODIUM 40 MG PO TBEC
40.0000 mg | DELAYED_RELEASE_TABLET | Freq: Every day | ORAL | Status: DC
  Administered 2018-05-12 – 2018-05-13 (×2): 40 mg via ORAL
  Filled 2018-05-11 (×2): qty 1

## 2018-05-11 MED ORDER — ASPIRIN EC 81 MG PO TBEC
81.0000 mg | DELAYED_RELEASE_TABLET | Freq: Every day | ORAL | Status: DC
  Administered 2018-05-11 – 2018-05-13 (×3): 81 mg via ORAL
  Filled 2018-05-11 (×3): qty 1

## 2018-05-11 MED ORDER — PHENYLEPHRINE 40 MCG/ML (10ML) SYRINGE FOR IV PUSH (FOR BLOOD PRESSURE SUPPORT)
PREFILLED_SYRINGE | INTRAVENOUS | Status: DC | PRN
  Administered 2018-05-11: 80 ug via INTRAVENOUS

## 2018-05-11 MED ORDER — BUPIVACAINE HCL (PF) 0.25 % IJ SOLN
INTRAMUSCULAR | Status: DC | PRN
  Administered 2018-05-11: 5 mL

## 2018-05-11 MED ORDER — FENTANYL CITRATE (PF) 250 MCG/5ML IJ SOLN
INTRAMUSCULAR | Status: AC
  Filled 2018-05-11: qty 5

## 2018-05-11 MED ORDER — PHENOL 1.4 % MT LIQD: 1.0000 | OROMUCOSAL | Status: DC | PRN

## 2018-05-11 MED ORDER — BUPIVACAINE HCL (PF) 0.25 % IJ SOLN
INTRAMUSCULAR | Status: AC
  Filled 2018-05-11: qty 30

## 2018-05-11 MED ORDER — CHLORHEXIDINE GLUCONATE CLOTH 2 % EX PADS: 6.0000 | MEDICATED_PAD | Freq: Once | CUTANEOUS | Status: DC

## 2018-05-11 MED ORDER — FENTANYL CITRATE (PF) 100 MCG/2ML IJ SOLN
INTRAMUSCULAR | Status: AC
  Filled 2018-05-11: qty 2

## 2018-05-11 MED ORDER — DEXAMETHASONE SODIUM PHOSPHATE 10 MG/ML IJ SOLN
INTRAMUSCULAR | Status: AC
  Filled 2018-05-11: qty 1

## 2018-05-11 MED ORDER — ARTIFICIAL TEARS OPHTHALMIC OINT
TOPICAL_OINTMENT | OPHTHALMIC | Status: AC
  Filled 2018-05-11: qty 3.5

## 2018-05-11 MED ORDER — ROCURONIUM BROMIDE 10 MG/ML (PF) SYRINGE
PREFILLED_SYRINGE | INTRAVENOUS | Status: DC | PRN
  Administered 2018-05-11: 10 mg via INTRAVENOUS
  Administered 2018-05-11: 20 mg via INTRAVENOUS
  Administered 2018-05-11: 30 mg via INTRAVENOUS
  Administered 2018-05-11: 10 mg via INTRAVENOUS
  Administered 2018-05-11: 60 mg via INTRAVENOUS
  Administered 2018-05-11 (×2): 20 mg via INTRAVENOUS

## 2018-05-11 MED ORDER — ROCURONIUM BROMIDE 50 MG/5ML IV SOLN
INTRAVENOUS | Status: AC
  Filled 2018-05-11: qty 1

## 2018-05-11 MED ORDER — ACETAMINOPHEN 325 MG PO TABS: 650.0000 mg | ORAL_TABLET | ORAL | Status: DC | PRN

## 2018-05-11 MED ORDER — 0.9 % SODIUM CHLORIDE (POUR BTL) OPTIME
TOPICAL | Status: DC | PRN
  Administered 2018-05-11: 1000 mL

## 2018-05-11 MED ORDER — LOSARTAN POTASSIUM 50 MG PO TABS
50.0000 mg | ORAL_TABLET | Freq: Every day | ORAL | Status: DC
  Administered 2018-05-11 – 2018-05-13 (×3): 50 mg via ORAL
  Filled 2018-05-11 (×3): qty 1

## 2018-05-11 MED ORDER — ONDANSETRON HCL 4 MG/2ML IJ SOLN
INTRAMUSCULAR | Status: AC
  Filled 2018-05-11: qty 2

## 2018-05-11 MED ORDER — SODIUM CHLORIDE 0.9% FLUSH: 3.0000 mL | INTRAVENOUS | Status: DC | PRN

## 2018-05-11 MED ORDER — SENNA 8.6 MG PO TABS
1.0000 | ORAL_TABLET | Freq: Two times a day (BID) | ORAL | Status: DC
  Administered 2018-05-12 – 2018-05-13 (×3): 8.6 mg via ORAL
  Filled 2018-05-11 (×4): qty 1

## 2018-05-11 MED ORDER — LACTATED RINGERS IV SOLN
INTRAVENOUS | Status: DC | PRN
Start: 2018-05-11 — End: 2018-05-11
  Administered 2018-05-11 (×2): via INTRAVENOUS

## 2018-05-11 MED ORDER — MIDAZOLAM HCL 2 MG/2ML IJ SOLN
INTRAMUSCULAR | Status: DC | PRN
  Administered 2018-05-11: 2 mg via INTRAVENOUS

## 2018-05-11 MED ORDER — GLYCOPYRROLATE 0.2 MG/ML IV SOSY
PREFILLED_SYRINGE | INTRAVENOUS | Status: DC | PRN
  Administered 2018-05-11 (×2): .1 mg via INTRAVENOUS

## 2018-05-11 MED ORDER — VANCOMYCIN HCL IN DEXTROSE 1-5 GM/200ML-% IV SOLN
1000.0000 mg | Freq: Once | INTRAVENOUS | Status: AC
  Administered 2018-05-11: 1000 mg via INTRAVENOUS
  Filled 2018-05-11: qty 200

## 2018-05-11 MED ORDER — ONDANSETRON HCL 4 MG/2ML IJ SOLN
4.0000 mg | Freq: Four times a day (QID) | INTRAMUSCULAR | Status: DC | PRN
  Administered 2018-05-11 (×2): 4 mg via INTRAVENOUS
  Filled 2018-05-11: qty 2

## 2018-05-11 MED ORDER — ROCURONIUM BROMIDE 50 MG/5ML IV SOLN
INTRAVENOUS | Status: AC
  Filled 2018-05-11: qty 2

## 2018-05-11 MED ORDER — HEPARIN SODIUM (PORCINE) 1000 UNIT/ML IJ SOLN
INTRAMUSCULAR | Status: AC
  Filled 2018-05-11: qty 1

## 2018-05-11 MED ORDER — LEVOTHYROXINE SODIUM 75 MCG PO TABS
125.0000 ug | ORAL_TABLET | Freq: Every day | ORAL | Status: DC
  Administered 2018-05-12 – 2018-05-13 (×2): 125 ug via ORAL
  Filled 2018-05-11 (×2): qty 1

## 2018-05-11 MED ORDER — THROMBIN (RECOMBINANT) 5000 UNITS EX SOLR
OROMUCOSAL | Status: DC | PRN
  Administered 2018-05-11: 10:00:00 via TOPICAL

## 2018-05-11 MED ORDER — ALBUTEROL SULFATE (2.5 MG/3ML) 0.083% IN NEBU: 3.0000 mL | INHALATION_SOLUTION | RESPIRATORY_TRACT | Status: DC | PRN

## 2018-05-11 MED ORDER — FENTANYL CITRATE (PF) 250 MCG/5ML IJ SOLN
INTRAMUSCULAR | Status: DC | PRN
  Administered 2018-05-11 (×2): 50 ug via INTRAVENOUS
  Administered 2018-05-11: 150 ug via INTRAVENOUS

## 2018-05-11 MED ORDER — ONDANSETRON HCL 4 MG/2ML IJ SOLN
INTRAMUSCULAR | Status: DC | PRN
  Administered 2018-05-11: 4 mg via INTRAVENOUS

## 2018-05-11 MED ORDER — VANCOMYCIN HCL 1000 MG IV SOLR
INTRAVENOUS | Status: DC | PRN
  Administered 2018-05-11: 1000 mg

## 2018-05-11 MED ORDER — BUPROPION HCL ER (XL) 150 MG PO TB24
150.0000 mg | ORAL_TABLET | Freq: Every day | ORAL | Status: DC
  Administered 2018-05-12 – 2018-05-13 (×2): 150 mg via ORAL
  Filled 2018-05-11 (×2): qty 1

## 2018-05-11 MED ORDER — PHENYLEPHRINE HCL 10 MG/ML IJ SOLN
INTRAMUSCULAR | Status: DC | PRN
  Administered 2018-05-11: 20 ug/min via INTRAVENOUS

## 2018-05-11 MED ORDER — PROPOFOL 10 MG/ML IV BOLUS
INTRAVENOUS | Status: AC
  Filled 2018-05-11: qty 20

## 2018-05-11 MED ORDER — PHENYLEPHRINE HCL 10 MG/ML IJ SOLN
INTRAMUSCULAR | Status: AC
  Filled 2018-05-11: qty 1

## 2018-05-11 MED ORDER — OXYCODONE-ACETAMINOPHEN 7.5-325 MG PO TABS
1.0000 | ORAL_TABLET | Freq: Four times a day (QID) | ORAL | Status: DC | PRN
  Administered 2018-05-11 – 2018-05-12 (×3): 1 via ORAL
  Filled 2018-05-11 (×2): qty 1

## 2018-05-11 MED ORDER — POTASSIUM CHLORIDE IN NACL 20-0.9 MEQ/L-% IV SOLN: INTRAVENOUS | Status: DC

## 2018-05-11 MED ORDER — ACETAMINOPHEN 650 MG RE SUPP: 650.0000 mg | RECTAL | Status: DC | PRN

## 2018-05-11 MED ORDER — LIDOCAINE 2% (20 MG/ML) 5 ML SYRINGE
INTRAMUSCULAR | Status: DC | PRN
  Administered 2018-05-11: 80 mg via INTRAVENOUS

## 2018-05-11 MED ORDER — MENTHOL 3 MG MT LOZG: 1.0000 | LOZENGE | OROMUCOSAL | Status: DC | PRN

## 2018-05-11 MED ORDER — VANCOMYCIN HCL IN DEXTROSE 1-5 GM/200ML-% IV SOLN
1000.0000 mg | INTRAVENOUS | Status: AC
  Administered 2018-05-11 (×2): 1000 mg via INTRAVENOUS
  Filled 2018-05-11: qty 200

## 2018-05-11 MED ORDER — LACTATED RINGERS IV SOLN
INTRAVENOUS | Status: DC
Start: 2018-05-11 — End: 2018-05-11
  Administered 2018-05-11: 10:00:00 via INTRAVENOUS

## 2018-05-11 MED ORDER — ARTIFICIAL TEARS OPHTHALMIC OINT
TOPICAL_OINTMENT | OPHTHALMIC | Status: DC | PRN
  Administered 2018-05-11: 1 via OPHTHALMIC

## 2018-05-11 MED ORDER — DEXAMETHASONE SODIUM PHOSPHATE 10 MG/ML IJ SOLN
INTRAMUSCULAR | Status: DC | PRN
  Administered 2018-05-11: 10 mg via INTRAVENOUS

## 2018-05-11 MED ORDER — METHOCARBAMOL 1000 MG/10ML IJ SOLN: 500.0000 mg | Freq: Four times a day (QID) | INTRAVENOUS | Status: DC | PRN

## 2018-05-11 MED ORDER — SURGIFOAM 100 EX MISC
CUTANEOUS | Status: DC | PRN
  Administered 2018-05-11: 10:00:00 via TOPICAL

## 2018-05-11 SURGICAL SUPPLY — 72 items
BAG DECANTER FOR FLEXI CONT (MISCELLANEOUS) ×3 IMPLANT
BASKET BONE COLLECTION (BASKET) ×3 IMPLANT
BENZOIN TINCTURE PRP APPL 2/3 (GAUZE/BANDAGES/DRESSINGS) ×3 IMPLANT
BLADE CLIPPER SURG (BLADE) IMPLANT
BUR MATCHSTICK NEURO 3.0 LAGG (BURR) ×3 IMPLANT
CAGE POROUS TI 12X9X25 10D (Cage) ×6 IMPLANT
CANISTER SUCT 3000ML PPV (MISCELLANEOUS) ×3 IMPLANT
CARTRIDGE OIL MAESTRO DRILL (MISCELLANEOUS) ×1 IMPLANT
CLOSURE WOUND 1/2 X4 (GAUZE/BANDAGES/DRESSINGS) ×1
CONT SPEC 4OZ CLIKSEAL STRL BL (MISCELLANEOUS) ×3 IMPLANT
COVER BACK TABLE 60X90IN (DRAPES) ×3 IMPLANT
DERMABOND ADVANCED (GAUZE/BANDAGES/DRESSINGS) ×2
DERMABOND ADVANCED .7 DNX12 (GAUZE/BANDAGES/DRESSINGS) ×1 IMPLANT
DIFFUSER DRILL AIR PNEUMATIC (MISCELLANEOUS) ×3 IMPLANT
DRAPE C-ARM 42X72 X-RAY (DRAPES) ×3 IMPLANT
DRAPE C-ARMOR (DRAPES) ×3 IMPLANT
DRAPE LAPAROTOMY 100X72X124 (DRAPES) ×3 IMPLANT
DRAPE POUCH INSTRU U-SHP 10X18 (DRAPES) ×3 IMPLANT
DRAPE SURG 17X23 STRL (DRAPES) ×3 IMPLANT
DRSG OPSITE POSTOP 4X6 (GAUZE/BANDAGES/DRESSINGS) ×3 IMPLANT
DRSG OPSITE POSTOP 4X8 (GAUZE/BANDAGES/DRESSINGS) ×3 IMPLANT
DURAPREP 26ML APPLICATOR (WOUND CARE) ×3 IMPLANT
ELECT REM PT RETURN 9FT ADLT (ELECTROSURGICAL) ×3
ELECTRODE REM PT RTRN 9FT ADLT (ELECTROSURGICAL) ×1 IMPLANT
EVACUATOR 1/8 PVC DRAIN (DRAIN) IMPLANT
GAUZE SPONGE 4X4 16PLY XRAY LF (GAUZE/BANDAGES/DRESSINGS) IMPLANT
GLOVE BIO SURGEON STRL SZ7 (GLOVE) IMPLANT
GLOVE BIO SURGEON STRL SZ8 (GLOVE) IMPLANT
GLOVE BIOGEL PI IND STRL 7.0 (GLOVE) ×2 IMPLANT
GLOVE BIOGEL PI IND STRL 7.5 (GLOVE) ×3 IMPLANT
GLOVE BIOGEL PI IND STRL 8 (GLOVE) ×2 IMPLANT
GLOVE BIOGEL PI INDICATOR 7.0 (GLOVE) ×4
GLOVE BIOGEL PI INDICATOR 7.5 (GLOVE) ×6
GLOVE BIOGEL PI INDICATOR 8 (GLOVE) ×4
GLOVE SURG SS PI 7.5 STRL IVOR (GLOVE) ×6 IMPLANT
GOWN STRL REUS W/ TWL LRG LVL3 (GOWN DISPOSABLE) ×2 IMPLANT
GOWN STRL REUS W/ TWL XL LVL3 (GOWN DISPOSABLE) ×2 IMPLANT
GOWN STRL REUS W/TWL 2XL LVL3 (GOWN DISPOSABLE) ×6 IMPLANT
GOWN STRL REUS W/TWL LRG LVL3 (GOWN DISPOSABLE) ×4
GOWN STRL REUS W/TWL XL LVL3 (GOWN DISPOSABLE) ×4
HEMOSTAT POWDER KIT SURGIFOAM (HEMOSTASIS) ×3 IMPLANT
KIT BASIN OR (CUSTOM PROCEDURE TRAY) ×3 IMPLANT
KIT BONE MRW ASP ANGEL CPRP (KITS) ×3 IMPLANT
KIT TURNOVER KIT B (KITS) ×3 IMPLANT
MATRIX STRIP NEOCORE 12C (Putty) ×1 IMPLANT
MILL MEDIUM DISP (BLADE) ×3 IMPLANT
NEEDLE HYPO 25X1 1.5 SAFETY (NEEDLE) ×3 IMPLANT
NS IRRIG 1000ML POUR BTL (IV SOLUTION) ×3 IMPLANT
OIL CARTRIDGE MAESTRO DRILL (MISCELLANEOUS) ×3
PACK LAMINECTOMY NEURO (CUSTOM PROCEDURE TRAY) ×3 IMPLANT
PAD ARMBOARD 7.5X6 YLW CONV (MISCELLANEOUS) ×9 IMPLANT
PUTTY DBM ALLOSYNC PURE 10CC (Putty) ×3 IMPLANT
ROD PC 5.5X60 TI ARSENAL (Rod) ×6 IMPLANT
SCREW CBX 5.5X35MM ARSENAL (Screw) ×6 IMPLANT
SCREW CORT CANC 5.5X40 (Screw) ×12 IMPLANT
SCREW SET SPINAL ARSENAL 47127 (Screw) ×18 IMPLANT
SPACER IDENTITI PS 11X9X25 10D (Spacer) ×6 IMPLANT
SPONGE LAP 4X18 X RAY DECT (DISPOSABLE) IMPLANT
SPONGE SURGIFOAM ABS GEL 100 (HEMOSTASIS) ×3 IMPLANT
STRIP CLOSURE SKIN 1/2X4 (GAUZE/BANDAGES/DRESSINGS) ×2 IMPLANT
STRIP MATRIX NEOCORE 12CC (Putty) ×2 IMPLANT
SUT VIC AB 0 CT1 18XCR BRD8 (SUTURE) ×1 IMPLANT
SUT VIC AB 0 CT1 8-18 (SUTURE) ×2
SUT VIC AB 2-0 CP2 18 (SUTURE) ×3 IMPLANT
SUT VIC AB 3-0 SH 8-18 (SUTURE) ×9 IMPLANT
SYR CONTROL 10ML LL (SYRINGE) ×3 IMPLANT
TOWEL GREEN STERILE (TOWEL DISPOSABLE) ×3 IMPLANT
TOWEL GREEN STERILE FF (TOWEL DISPOSABLE) ×3 IMPLANT
TRAY FOL W/BAG SLVR 16FR STRL (SET/KITS/TRAYS/PACK) ×1 IMPLANT
TRAY FOLEY MTR SLVR 16FR STAT (SET/KITS/TRAYS/PACK) IMPLANT
TRAY FOLEY W/BAG SLVR 16FR LF (SET/KITS/TRAYS/PACK) ×2
WATER STERILE IRR 1000ML POUR (IV SOLUTION) ×3 IMPLANT

## 2018-05-11 NOTE — Op Note (Signed)
05/11/2018  2:00 PM  PATIENT:  Victoria Pena  67 y.o. female  PRE-OPERATIVE DIAGNOSIS:  Degenerative spondylolisthesis L3-4 and L4-5 with spinal stenosis, back and leg pain  POST-OPERATIVE DIAGNOSIS:  same  PROCEDURE:   1. Decompressive lumbar laminectomy L3-4 and L4-5 requiring more work than would be required for a simple exposure of the disk for PLIF in order to adequately decompress the neural elements and address the spinal stenosis 2. Posterior lumbar interbody fusion L3-4 and L4-5 using porous titanium interbody cages packed with morcellized allograft and autograft 3. Posterior fixation L3-L5 inclusive using Alphatec cortical pedicle screws.  4. Intertransverse arthrodesis L3-L5 using morcellized autograft and allograft. 5. Bone marrow aspirate from right iliac crest through separate fascial incision for later arthrodesis  SURGEON:  Sherley Bounds, MD  ASSISTANTS: Dr. Sherwood Gambler  ANESTHESIA:  General  EBL: 300 ml  Total I/O In: 1000 [I.V.:1000] Out: 800 [Urine:500; Blood:300]  BLOOD ADMINISTERED:80 CC CELLSAVER  DRAINS: none   INDICATION FOR PROCEDURE: This patient presented with severe back and leg pain. Imaging revealed dynamic spondylolisthesis L3-4 and L4-5 with spinal stenosis. The patient tried a reasonable attempt at conservative medical measures without relief. I recommended decompression and instrumented fusion to address the stenosis as well as the segmental  instability.  Patient understood the risks, benefits, and alternatives and potential outcomes and wished to proceed.  PROCEDURE DETAILS:  The patient was brought to the operating room. After induction of generalized endotracheal anesthesia the patient was rolled into the prone position on chest rolls and all pressure points were padded. The patient's lumbar region was cleaned and then prepped with DuraPrep and draped in the usual sterile fashion. Anesthesia was injected and then a dorsal midline incision was  made and carried down to the lumbosacral fascia. I dissected in a suprafascial plane on the right-hand side to expose the right iliac crest. The fascia was opened and a Jamshidi needle was used to extract 60 mL of bone marrow aspirate from the right iliac crest. It was then spun down to 3 mL of supernatant which was then put on allograft for later arthrodesis. The fascia over the iliac crest was closed. The lumbar fascia was opened and the paraspinous musculature was taken down in a subperiosteal fashion to expose L3-4 and L4-5. A self-retaining retractor was placed. Intraoperative fluoroscopy confirmed my level, and I started with placement of the L3 cortical pedicle screws. The pedicle screw entry zones were identified utilizing surface landmarks and  AP and lateral fluoroscopy. I scored the cortex with the high-speed drill and then used the hand drill to drill an upward and outward direction into the pedicle. I then tapped line to line. I then placed a 5.5 x 35 mm cortical pedicle screw into the pedicles of L3 bilaterally. I then turned my attention to the decompression and complete lumbar laminectomies, hemi- facetectomies, and foraminotomies were performed at L3-4 and L4-5. The patient had significant spinal stenosis and this required more work than would be required for a simple exposure of the disc for posterior lumbar interbody fusion which would only require a limited laminotomy. Much more generous decompression and generous foraminotomy was undertaken in order to adequately decompress the neural elements and address the patient's leg pain. The yellow ligament was removed to expose the underlying dura and nerve roots, and generous foraminotomies were performed to adequately decompress the neural elements. Both the exiting and traversing nerve roots were decompressed on both sides until a coronary dilator passed easily along the nerve  roots. Once the decompression was complete, I turned my attention to the  posterior lower lumbar interbody fusion. The epidural venous vasculature was coagulated and cut sharply. Disc space was incised and the initial discectomy was performed with pituitary rongeurs. The disc space was distracted with sequential distractors to a height of 11 mm at L3-4 and 12 mm at L4-5. We then used a series of scrapers and shavers to prepare the endplates for fusion. The midline was prepared with Epstein curettes. Once the complete discectomy was finished, we packed an appropriate sized interbody cage with local autograft and morcellized allograft, gently retracted the nerve root, and tapped the cage into position at L3-4 and L4-5.  The midline between the cages was packed with morselized autograft and allograft. We then turned our attention to the placement of the lower pedicle screws. The pedicle screw entry zones were identified utilizing surface landmarks and fluoroscopy. I drilled into each pedicle utilizing the hand drill, and tapped each pedicle with the appropriate tap. We palpated with a ball probe to assure no break in the cortex. We then placed 5.5 x 40 mm cortical pedicle screws into the pedicles bilaterally at L4 and L5. We then decorticated the transverse processes and laid a mixture of morcellized autograft and allograft out over these to perform intertransverse arthrodesis at L3-L5. We then placed lordotic rods into the multiaxial screw heads of the pedicle screws and locked these in position with the locking caps and anti-torque device. We then checked our construct with AP and lateral fluoroscopy. Irrigated with copious amounts of bacitracin-containing saline solution. Inspected the nerve roots once again to assure adequate decompression, lined to the dura with Gelfoam, placed powdered vancomycin into the wound, and closed the muscle and the fascia with 0 Vicryl. Closed the subcutaneous tissues with 2-0 Vicryl and subcuticular tissues with 3-0 Vicryl. The skin was closed with benzoin  and Steri-Strips. Dressing was then applied, the patient was awakened from general anesthesia and transported to the recovery room in stable condition. At the end of the procedure all sponge, needle and instrument counts were correct.   PLAN OF CARE: admit to inpatient  PATIENT DISPOSITION:  PACU - hemodynamically stable.   Delay start of Pharmacological VTE agent (>24hrs) due to surgical blood loss or risk of bleeding:  yes

## 2018-05-11 NOTE — Anesthesia Preprocedure Evaluation (Signed)
Anesthesia Evaluation  Patient identified by MRN, date of birth, ID band Patient awake    Reviewed: Allergy & Precautions, NPO status , Patient's Chart, lab work & pertinent test results  Airway Mallampati: II  TM Distance: >3 FB     Dental   Pulmonary sleep apnea , COPD, former smoker,    breath sounds clear to auscultation       Cardiovascular hypertension,  Rhythm:Regular Rate:Normal     Neuro/Psych    GI/Hepatic Neg liver ROS, GERD  ,  Endo/Other  Hyperthyroidism   Renal/GU negative Renal ROS     Musculoskeletal  (+) Arthritis ,   Abdominal   Peds  Hematology   Anesthesia Other Findings   Reproductive/Obstetrics                             Anesthesia Physical Anesthesia Plan  ASA: III  Anesthesia Plan: General   Post-op Pain Management:    Induction:   PONV Risk Score and Plan: Treatment may vary due to age or medical condition  Airway Management Planned: Oral ETT  Additional Equipment:   Intra-op Plan:   Post-operative Plan: Extubation in OR  Informed Consent:   Dental advisory given  Plan Discussed with: CRNA and Anesthesiologist  Anesthesia Plan Comments:         Anesthesia Quick Evaluation

## 2018-05-11 NOTE — OR Nursing (Signed)
ACD-A 10 ML EXPIRES 07/21 USED WITH ARTHREX MACHINE

## 2018-05-11 NOTE — H&P (Signed)
Subjective: Patient is a 67 y.o. female admitted for PLIF. Onset of symptoms was several months ago, gradually worsening since that time.  The pain is rated severe, and is located at the across the lower back and radiates to legs. The pain is described as aching and occurs all day. The symptoms have been progressive. Symptoms are exacerbated by exercise. MRI or CT showed spondylolisthesis with stenosis L3-4, L4-5   Past Medical History:  Diagnosis Date  . Adenocarcinoma of right lung, stage 1 ([place]) 07/26/2016  . Anxiety   . Arthritis   . COPD (chronic obstructive pulmonary disease) ([place])    pt denies  . Depression   . GERD (gastroesophageal reflux disease)   . Hypertension   . Sleep apnea    "very very mild" no CPAP  . Thyroid disease     Past Surgical History:  Procedure Laterality Date  . ANKLE SURGERY     bilateral  . APPENDECTOMY    . CHOLECYSTECTOMY    . REPLACEMENT TOTAL KNEE Left   . THYROIDECTOMY      Prior to Admission medications   Medication Sig Start Date End Date Taking? Authorizing Provider  acetaminophen (TYLENOL) 500 MG tablet Take 1,000 mg by mouth 2 (two) times daily as needed for mild pain.   Yes [provider]  albuterol (PROVENTIL HFA;VENTOLIN HFA) 108 (90 Base) MCG/ACT inhaler Inhale 2 puffs into the lungs every 4 (four) hours as needed for wheezing or shortness of breath.    Yes [provider]  ALPRAZolam Duanne Moron) 0.5 MG tablet Take 0.25 mg by mouth 2 (two) times daily.    Yes [provider]  aspirin 81 MG tablet Take 81 mg by mouth daily.   Yes [provider]  buPROPion (WELLBUTRIN XL) 150 MG 24 hr tablet Take 150 mg by mouth daily.   Yes [provider]  ibuprofen (ADVIL,MOTRIN) 200 MG tablet Take 600 mg by mouth 2 (two) times daily as needed for mild pain.   Yes [provider]  levothyroxine (SYNTHROID, LEVOTHROID) 125 MCG tablet Take 125 mcg by mouth daily before breakfast.   Yes [provider]  losartan (COZAAR) 50 MG tablet Take 50 mg by mouth daily. 03/04/17  Yes [provider]  metroNIDAZOLE (METROGEL) 0.75 % gel APPLY THIN COAT TO AFFECTED AREA TWICE A DAY as needed for rosacea 09/28/17  Yes [provider]  Multiple Vitamin (MULTIVITAMIN WITH MINERALS) TABS tablet Take 1 tablet by mouth daily.   Yes [provider]  oxyCODONE-acetaminophen (PERCOCET) 7.5-325 MG tablet Take 1 tablet by mouth every 6 (six) hours as needed for severe pain. Not relived with Meloxicam Patient taking differently: Take 1 tablet by mouth every 6 (six) hours as needed for severe pain.  04/05/18  Yes Stover, Titorya, DPM  pantoprazole (PROTONIX) 40 MG tablet Take 40 mg by mouth daily.   Yes [provider]  Polyvinyl Alcohol-Povidone (REFRESH OP) Place 1 drop into both eyes every 6 (six) hours as needed (dry eyes).   Yes [provider]  senna-docusate (SENOKOT-S) 8.6-50 MG tablet Take 2-3 tablets by mouth at bedtime as needed for mild constipation.   Yes [provider]  lidocaine (LIDODERM) 5 % Place 1 patch onto the skin daily. Remove & Discard patch within 12 hours or as directed by MD Patient not taking: Reported on 04/27/2018 01/04/18   Landis Martins, DPM  meloxicam (MOBIC) 15 MG tablet Take 1 tablet (15 mg total) by mouth daily. In the  morning with food for pain and inflammation Patient not taking: Reported on 04/27/2018 04/05/18   Landis Martins, DPM   Allergies  Allergen Reactions  . Dextromethorphan Hbr Shortness Of Breath  . Keflex [Cephalexin] Shortness Of Breath    Hard time breathing  . Bactrim [Sulfamethoxazole-Trimethoprim] Itching  . Hydrocodone Other (See Comments) and Anxiety    Heart racing, hard to concentrate, face flushed  . Hydroxyzine Other (See Comments) and Swelling    Numbness in lips  . Prednisone & Diphenhydramine Other (See Comments)    Face flushed, heart racing  . Meloxicam Other (See Comments)     Headache, body aches  . Moxifloxacin Rash  . Percocet [Oxycodone-Acetaminophen] Itching    "sometimes I itch", can still take this    Social History   Tobacco Use  . Smoking status: Former Smoker    Packs/day: 1.50    Years: 35.00    Pack years: 52.50    Last attempt to quit: 07/27/1999    Years since quitting: 18.8  . Smokeless tobacco: Never Used  Substance Use Topics  . Alcohol use: No    Family History  Problem Relation Age of Onset  . Cancer Mother   . Cancer Father   . Cancer Brother      Review of Systems  Positive ROS: neg  All other systems have been reviewed and were otherwise negative with the exception of those mentioned in the HPI and as above.  Objective: Vital signs in last 24 hours:    General Appearance: Alert, cooperative, no distress, appears stated age Head: Normocephalic, without obvious abnormality, atraumatic Eyes: PERRL, conjunctiva/corneas clear, EOM's intact    Neck: Supple, symmetrical, trachea midline Back: Symmetric, no curvature, ROM normal, no CVA tenderness Lungs:  respirations unlabored Heart: Regular rate and rhythm Abdomen: Soft, non-tender Extremities: Extremities normal, atraumatic, no cyanosis or edema Pulses: 2+ and symmetric all extremities Skin: Skin color, texture, turgor normal, no rashes or lesions  NEUROLOGIC:   Mental status: Alert and oriented x4,  no aphasia, good attention span, fund of knowledge, and memory Motor Exam - grossly normal Sensory Exam - grossly normal Reflexes: 1+ Coordination - grossly normal Gait - grossly normal Balance - grossly normal Cranial Nerves: I: smell Not tested  II: visual acuity  OS: nl    OD: nl  II: visual fields Full to confrontation  II: pupils Equal, round, reactive to light  III,VII: ptosis None  III,IV,VI: extraocular muscles  Full ROM  V: mastication Normal  V: facial light touch sensation  Normal  V,VII: corneal reflex  Present  VII: facial muscle function - upper   Normal  VII: facial muscle function - lower Normal  VIII: hearing Not tested  IX: soft palate elevation  Normal  IX,X: gag reflex Present  XI: trapezius strength  5/5  XI: sternocleidomastoid strength 5/5  XI: neck flexion strength  5/5  XII: tongue strength  Normal    Data Review Lab Results  Component Value Date   WBC 4.7 05/03/2018   HGB 12.5 05/03/2018   HCT 41.5 05/03/2018   MCV 85.2 05/03/2018   PLT 265 05/03/2018   Lab Results  Component Value Date   NA 141 05/03/2018   K 4.0 05/03/2018   CL 107 05/03/2018   CO2 28 05/03/2018   BUN 15 05/03/2018   CREATININE 0.71 05/03/2018   GLUCOSE 97 05/03/2018   Lab Results  Component Value Date   INR 0.95 05/03/2018    Assessment/Plan:  Estimated body  mass index is 31.45 kg/m as calculated from the following:   Height as of 05/03/18: 5\' 4"  (1.626 m).   Weight as of 05/03/18: 83.1 kg (183 lb 3.2 oz). Patient admitted for PLIF L3-4 l4-5. Patient has failed a reasonable attempt at conservative therapy.  I explained the condition and procedure to the patient and answered any questions.  Patient wishes to proceed with procedure as planned. Understands risks/ benefits and typical outcomes of procedure.   Enyla Lisbon S 05/11/2018 7:43 AM

## 2018-05-11 NOTE — Transfer of Care (Signed)
Immediate Anesthesia Transfer of Care Note  Patient: Victoria Pena  Procedure(s) Performed: POSTERIOR LUMBAR INTERBODY FUSION LUMBAR THREE-FOUR LUMBAR FOUR-FIVE (N/A Back)  Patient Location: PACU  Anesthesia Type:General  Level of Consciousness: awake, alert  and oriented  Airway & Oxygen Therapy: Patient Spontanous Breathing and Patient connected to face mask oxygen  Post-op Assessment: Report given to RN and Post -op Vital signs reviewed and stable  Post vital signs: Reviewed and stable  Last Vitals:  Vitals Value Taken Time  BP    Temp    Pulse    Resp    SpO2      Last Pain:  Vitals:   05/11/18 0918  TempSrc:   PainSc: 0-No pain      Patients Stated Pain Goal: 3 (62/83/66 2947)  Complications: No apparent anesthesia complications

## 2018-05-11 NOTE — Anesthesia Procedure Notes (Signed)
Procedure Name: Intubation Date/Time: 05/11/2018 10:31 AM Performed by: Teressa Lower., CRNA Pre-anesthesia Checklist: Patient identified, Emergency Drugs available, Suction available and Patient being monitored Patient Re-evaluated:Patient Re-evaluated prior to induction Oxygen Delivery Method: Circle system utilized Preoxygenation: Pre-oxygenation with 100% oxygen Induction Type: IV induction Ventilation: Mask ventilation without difficulty and Oral airway inserted - appropriate to patient size Laryngoscope Size: Mac and 3 Grade View: Grade I Tube type: Oral Tube size: 7.0 mm Number of attempts: 1 Airway Equipment and Method: Stylet and Oral airway Placement Confirmation: ETT inserted through vocal cords under direct vision,  positive ETCO2 and breath sounds checked- equal and bilateral Secured at: 21 cm Tube secured with: Tape Dental Injury: Teeth and Oropharynx as per pre-operative assessment

## 2018-05-11 NOTE — Anesthesia Postprocedure Evaluation (Signed)
Anesthesia Post Note  Patient: Victoria Pena  Procedure(s) Performed: POSTERIOR LUMBAR INTERBODY FUSION LUMBAR THREE-FOUR LUMBAR FOUR-FIVE (N/A Back)     Patient location during evaluation: PACU Anesthesia Type: General Level of consciousness: awake Pain management: pain level controlled Vital Signs Assessment: post-procedure vital signs reviewed and stable Respiratory status: spontaneous breathing Cardiovascular status: stable Anesthetic complications: no    Last Vitals:  Vitals:   05/11/18 1526 05/11/18 1621  BP: 109/69 119/75  Pulse:  84  Resp:  16  Temp: 36.4 C 36.6 C  SpO2:  99%    Last Pain:  Vitals:   05/11/18 1621  TempSrc: Oral  PainSc:                  Cardale Dorer

## 2018-05-11 NOTE — Progress Notes (Signed)
Pharmacy Antibiotic Note  Victoria Pena is a 67 y.o. female admitted on 05/11/2018 with surgical prophylaxis.  Pharmacy has been consulted for vancomycin x 1 dosing.  No drain in place post-op.  Plan: Vancomycin 1g x 1. Pharmacy will sign off, please call if needed.  Thanks!   Weight: 183 lb (83 kg)  Temp (24hrs), Avg:97.9 F (36.6 C), Min:97.6 F (36.4 C), Max:98.6 F (37 C)  No results for input(s): WBC, CREATININE, LATICACIDVEN, VANCOTROUGH, VANCOPEAK, VANCORANDOM, GENTTROUGH, GENTPEAK, GENTRANDOM, TOBRATROUGH, TOBRAPEAK, TOBRARND, AMIKACINPEAK, AMIKACINTROU, AMIKACIN in the last 168 hours.  Estimated Creatinine Clearance: 71.1 mL/min (by C-G formula based on SCr of 0.71 mg/dL).    Allergies  Allergen Reactions  . Dextromethorphan Hbr Shortness Of Breath  . Keflex [Cephalexin] Shortness Of Breath    Hard time breathing  . Bactrim [Sulfamethoxazole-Trimethoprim] Itching  . Hydrocodone Other (See Comments) and Anxiety    Heart racing, hard to concentrate, face flushed  . Hydroxyzine Other (See Comments) and Swelling    Numbness in lips  . Prednisone & Diphenhydramine Other (See Comments)    Face flushed, heart racing  . Meloxicam Other (See Comments)    Headache, body aches  . Moxifloxacin Rash  . Percocet [Oxycodone-Acetaminophen] Itching    "sometimes I itch", can still take this     Thank you for allowing pharmacy to be a part of this patient's care.  Marguerite Olea, [facility] Clinical Pharmacist Pager [phone]  05/11/2018 4:45 PM

## 2018-05-12 MED ORDER — ALUM & MAG HYDROXIDE-SIMETH 200-200-20 MG/5ML PO SUSP
30.0000 mL | Freq: Four times a day (QID) | ORAL | Status: DC | PRN
  Administered 2018-05-12 (×2): 30 mL via ORAL
  Filled 2018-05-12 (×2): qty 30

## 2018-05-12 MED ORDER — OXYCODONE HCL 5 MG PO TABS
10.0000 mg | ORAL_TABLET | ORAL | Status: DC | PRN
  Administered 2018-05-12 – 2018-05-13 (×6): 10 mg via ORAL
  Filled 2018-05-12 (×6): qty 2

## 2018-05-12 MED ORDER — DIPHENHYDRAMINE HCL 25 MG PO CAPS
25.0000 mg | ORAL_CAPSULE | Freq: Four times a day (QID) | ORAL | Status: DC | PRN
Start: 2018-05-12 — End: 2018-05-13

## 2018-05-12 MED FILL — Thrombin For Soln 5000 Unit: CUTANEOUS | Qty: 5000 | Status: AC

## 2018-05-12 MED FILL — Thrombin For Soln 20000 Unit: CUTANEOUS | Qty: 1 | Status: AC

## 2018-05-12 NOTE — Evaluation (Signed)
Occupational Therapy Evaluation Patient Details Name: Victoria Pena MRN: [id number] DOB: [birth year] Today's Date: 05/12/2018    History of Present Illness Pt is a 67 y/o female who presents s/p L3-L5 PLIF on 05/11/18.   Clinical Impression   This 67 yo female admitted and underwent above presents to acute OT with increased pain, increased nausea, decreased balance, and back precautions all affecting her PLOF of being totally independent with basic and IADLs. She will benefit from acute OT for one more session for tub/shower transfer to 3n1.     Follow Up Recommendations  No OT follow up;Supervision/Assistance - 24 hour    Equipment Recommendations  3 in 1 bedside commode       Precautions / Restrictions Precautions Precautions: Fall;Back Precaution Booklet Issued: Yes (comment) Precaution Comments: Reviewed in detail with pt and adult granddaughter Required Braces or Orthoses: Spinal Brace Spinal Brace: Lumbar corset;Applied in sitting position Restrictions Weight Bearing Restrictions: No      Mobility Bed Mobility       General bed mobility comments: pt up in room with adult grand-daughter just coming back from bathroom  Transfers Overall transfer level: Needs assistance Equipment used: Rolling walker (2 wheeled) Transfers: Sit to/from Stand Sit to Stand: Min guard            Balance Overall balance assessment: Mild deficits observed, not formally tested                                         ADL either performed or assessed with clinical judgement   ADL                                         General ADL Comments: Educated pt and adult grand-daughter on use of wet wipes for back peri care, using 2 cups for brushing teeth, placing items at countertop level that she needs most often, sequence of dressing, limit sitting to 20-30 minutes at a time--get up and walk around--then can sit for another 20-30 minutes, use of AE for  LBD (pt donned her underwear and socks post set up and with verbal instruction)     Vision Patient Visual Report: No change from baseline              Pertinent Vitals/Pain Pain Assessment: 0-10 Pain Score: 7  Faces Pain Scale: Hurts even more Pain Location: Incision site Pain Descriptors / Indicators: Operative site guarding;Discomfort Pain Intervention(s): Limited activity within patient's tolerance;Monitored during session;Repositioned     Hand Dominance Right   Extremity/Trunk Assessment Upper Extremity Assessment Upper Extremity Assessment: Overall WFL for tasks assessed     Communication Communication Communication: No difficulties   Cognition Arousal/Alertness: Awake/alert Behavior During Therapy: WFL for tasks assessed/performed Overall Cognitive Status: Within Functional Limits for tasks assessed                                                Home Living Family/patient expects to be discharged to:: Private residence Living Arrangements: Children;Other relatives Available Help at Discharge: Family;Available 24 hours/day Type of Home: Mobile home Home Access: Stairs to enter Entrance Stairs-Number of Steps: 4 Entrance Stairs-Rails: Right;Left;Can reach both Home  Layout: One level     Bathroom Shower/Tub: Corporate investment banker: Standard     Home Equipment: None          Prior Functioning/Environment Level of Independence: Independent        Comments: Did her own yard work        OT Problem List: Decreased range of motion;Decreased strength;Impaired balance (sitting and/or standing);Pain;Decreased knowledge of precautions;Decreased knowledge of use of DME or AE      OT Treatment/Interventions: Self-care/ADL training;Balance training;DME and/or AE instruction;Patient/family education    OT Goals(Current goals can be found in the care plan section) Acute Rehab OT Goals Patient Stated Goal: Decrease pain  and nausea, go home tomorrow OT Goal Formulation: With patient Time For Goal Achievement: 05/19/18 Potential to Achieve Goals: Good  OT Frequency: Min 2X/week              AM-PAC PT "6 Clicks" Daily Activity     Outcome Measure Help from another person eating meals?: None Help from another person taking care of personal grooming?: A Little Help from another person toileting, which includes using toliet, bedpan, or urinal?: A Little Help from another person bathing (including washing, rinsing, drying)?: A Little Help from another person to put on and taking off regular upper body clothing?: A Little Help from another person to put on and taking off regular lower body clothing?: A Little 6 Click Score: 19   End of Session Equipment Utilized During Treatment: Rolling walker;Back brace Nurse Communication: (need for 3n1 and RW)  Activity Tolerance: Patient tolerated treatment well Patient left: in chair;with call bell/phone within reach;with family/visitor present  OT Visit Diagnosis: Unsteadiness on feet (R26.81);Pain Pain - part of body: (back)                Time: 2956-2130 OT Time Calculation (min): 43 min Charges:  OT General Charges $OT Visit: 1 Visit OT Evaluation $OT Eval Moderate Complexity: 1 Mod OT Treatments $Self Care/Home Management : 23-37 mins Golden Circle, OTR/L [phone] 05/12/2018

## 2018-05-12 NOTE — Evaluation (Signed)
Physical Therapy Evaluation Patient Details Name: Victoria Pena MRN: [id number] DOB: 1951/11/13 Today's Date: 05/12/2018   History of Present Illness  Pt is a 67 y/o female who presents s/p L3-L5 PLIF on 05/11/18.  Clinical Impression  Pt admitted with above diagnosis. Pt currently with functional limitations due to the deficits listed below (see PT Problem List). At the time of PT eval pt was able to perform transfers and ambulation with gross min guard assist to min assist for balance support and safety. Pt requiring increased time for all aspects of functional mobility due to nausea and pain. We were unable to progress to ambulation outside of room or to stair training. Anticipate pt will progress well as pain and nausea are better controlled, however if progress is slower, may benefit from HHPT at d/c. Acutely, pt will benefit from skilled PT to increase their independence and safety with mobility to allow discharge to the venue listed below.       Follow Up Recommendations No PT follow up;Supervision for mobility/OOB    Equipment Recommendations  Rolling walker with 5" wheels;3in1 (PT)    Recommendations for Other Services       Precautions / Restrictions Precautions Precautions: Fall;Back Precaution Booklet Issued: Yes (comment) Precaution Comments: Reviewed in detail with pt and adult granddaughter Required Braces or Orthoses: Spinal Brace Spinal Brace: Lumbar corset;Applied in sitting position Restrictions Weight Bearing Restrictions: No      Mobility  Bed Mobility Overal bed mobility: Needs Assistance Bed Mobility: Sidelying to Sit   Sidelying to sit: Min guard       General bed mobility comments: Close guard for safety as pt elevated trunk to full sitting position. Pt required increased time and rails for support.   Transfers Overall transfer level: Needs assistance Equipment used: Rolling walker (2 wheeled) Transfers: Sit to/from Stand Sit to Stand: Min  assist         General transfer comment: Assist for balance support as pt powered up to full stand. VC's for hand placement on seated surface for safety.   Ambulation/Gait Ambulation/Gait assistance: Min guard Ambulation Distance (Feet): 25 Feet Assistive device: Rolling walker (2 wheeled) Gait Pattern/deviations: Step-through pattern;Decreased stride length;Trunk flexed Gait velocity: Decreased Gait velocity interpretation: <1.8 ft/sec, indicate of risk for recurrent falls General Gait Details: VC's for sequencing and general safety with the RW. Pt moving slow and generally guarded due to pain and nausea. In room only.  Stairs            Wheelchair Mobility    Modified Rankin (Stroke Patients Only)       Balance Overall balance assessment: Mild deficits observed, not formally tested                                           Pertinent Vitals/Pain Pain Assessment: Faces Faces Pain Scale: Hurts even more Pain Location: Incision site Pain Descriptors / Indicators: Operative site guarding;Discomfort Pain Intervention(s): Limited activity within patient's tolerance;Monitored during session;Repositioned    Home Living Family/patient expects to be discharged to:: Private residence Living Arrangements: Children Available Help at Discharge: Family;Available 24 hours/day Type of Home: Mobile home Home Access: Stairs to enter Entrance Stairs-Rails: Right;Left;Can reach both Entrance Stairs-Number of Steps: 4 Home Layout: One level Home Equipment: None      Prior Function Level of Independence: Independent  Hand Dominance        Extremity/Trunk Assessment   Upper Extremity Assessment Upper Extremity Assessment: Defer to OT evaluation    Lower Extremity Assessment Lower Extremity Assessment: Generalized weakness(Consistent with pre-op diagnosis)    Cervical / Trunk Assessment Cervical / Trunk Assessment: Other  exceptions Cervical / Trunk Exceptions: s/p surgery  Communication   Communication: No difficulties  Cognition Arousal/Alertness: Awake/alert Behavior During Therapy: Anxious Overall Cognitive Status: Within Functional Limits for tasks assessed                                        General Comments      Exercises     Assessment/Plan    PT Assessment Patient needs continued PT services  PT Problem List Decreased strength;Decreased range of motion;Decreased activity tolerance;Decreased balance;Decreased mobility;Decreased safety awareness;Decreased knowledge of precautions;Pain       PT Treatment Interventions DME instruction;Gait training;Stair training;Functional mobility training;Therapeutic activities;Therapeutic exercise;Neuromuscular re-education;Patient/family education    PT Goals (Current goals can be found in the Care Plan section)  Acute Rehab PT Goals Patient Stated Goal: Decrease pain and nausea PT Goal Formulation: With patient/family Time For Goal Achievement: 05/19/18 Potential to Achieve Goals: Good    Frequency Min 5X/week   Barriers to discharge        Co-evaluation               AM-PAC PT "6 Clicks" Daily Activity  Outcome Measure Difficulty turning over in bed (including adjusting bedclothes, sheets and blankets)?: A Little Difficulty moving from lying on back to sitting on the side of the bed? : Unable Difficulty sitting down on and standing up from a chair with arms (e.g., wheelchair, bedside commode, etc,.)?: A Little Help needed moving to and from a bed to chair (including a wheelchair)?: A Little Help needed walking in hospital room?: A Little Help needed climbing 3-5 steps with a railing? : A Lot 6 Click Score: 15    End of Session Equipment Utilized During Treatment: Back brace Activity Tolerance: Patient tolerated treatment well Patient left: in bed;with call bell/phone within reach;with family/visitor  present Nurse Communication: Mobility status PT Visit Diagnosis: Unsteadiness on feet (R26.81);Pain;Difficulty in walking, not elsewhere classified (R26.2) Pain - part of body: (back)    Time: 0722-0750 PT Time Calculation (min) (ACUTE ONLY): 28 min   Charges:   PT Evaluation $PT Eval Moderate Complexity: 1 Mod PT Treatments $Gait Training: 8-22 mins   PT G Codes:        Rolinda Roan, PT, DPT Acute Rehabilitation Services Pager: [phone]   Thelma Comp 05/12/2018, 9:47 AM

## 2018-05-12 NOTE — Progress Notes (Signed)
Patient ID: Victoria Pena, female   DOB: January 12, 1951, 67 y.o.   MRN: [id number] Subjective: Patient reports nausea, back aching, no leg pain or NTW  Objective: Vital signs in last 24 hours: Temp:  [97.6 F (36.4 C)-98.7 F (37.1 C)] 98.7 F (37.1 C) (05/24 0726) Pulse Rate:  [73-101] 101 (05/24 0726) Resp:  [7-24] 16 (05/24 0726) BP: (85-123)/(52-84) 108/52 (05/24 0726) SpO2:  [87 %-100 %] 98 % (05/24 0726) Weight:  [83 kg (183 lb)] 83 kg (183 lb) (05/23 1654)  Intake/Output from previous day: 05/23 0701 - 05/24 0700 In: 2203 [P.O.:200; I.V.:2003] Out: 2500 [Urine:2200; Blood:300] Intake/Output this shift: No intake/output data recorded.  Neurologic: Grossly normal  Lab Results: Lab Results  Component Value Date   WBC 4.7 05/03/2018   HGB 12.5 05/03/2018   HCT 41.5 05/03/2018   MCV 85.2 05/03/2018   PLT 265 05/03/2018   Lab Results  Component Value Date   INR 0.95 05/03/2018   BMET Lab Results  Component Value Date   NA 141 05/03/2018   K 4.0 05/03/2018   CL 107 05/03/2018   CO2 28 05/03/2018   GLUCOSE 97 05/03/2018   BUN 15 05/03/2018   CREATININE 0.71 05/03/2018   CALCIUM 9.1 05/03/2018    Studies/Results: Dg Lumbar Spine 2-3 Views  Result Date: 05/11/2018 CLINICAL DATA:  Status post L3-4 and L4-5 posterior fusion. EXAM: DG C-ARM 61-120 MIN; LUMBAR SPINE - 2-3 VIEW Radiation exposure index: 121.04 mGy. COMPARISON:  Radiographs of April 18, 2018. FINDINGS: Two intraoperative fluoroscopic images of the lower lumbar spine demonstrate the patient be status post surgical posterior fusion of L3-4 and L4-5 with interbody fusion. Good alignment of vertebral bodies is noted. IMPRESSION: Status post surgical posterior fusion of L3-4 and L4-5. Electronically Signed   By: Marijo Conception, M.D.   On: 05/11/2018 14:42   Dg C-arm 1-60 Min  Result Date: 05/11/2018 CLINICAL DATA:  Status post L3-4 and L4-5 posterior fusion. EXAM: DG C-ARM 61-120 MIN; LUMBAR SPINE - 2-3 VIEW  Radiation exposure index: 121.04 mGy. COMPARISON:  Radiographs of April 18, 2018. FINDINGS: Two intraoperative fluoroscopic images of the lower lumbar spine demonstrate the patient be status post surgical posterior fusion of L3-4 and L4-5 with interbody fusion. Good alignment of vertebral bodies is noted. IMPRESSION: Status post surgical posterior fusion of L3-4 and L4-5. Electronically Signed   By: Marijo Conception, M.D.   On: 05/11/2018 14:42   Dg C-arm 1-60 Min  Result Date: 05/11/2018 CLINICAL DATA:  Status post L3-4 and L4-5 posterior fusion. EXAM: DG C-ARM 61-120 MIN; LUMBAR SPINE - 2-3 VIEW Radiation exposure index: 121.04 mGy. COMPARISON:  Radiographs of April 18, 2018. FINDINGS: Two intraoperative fluoroscopic images of the lower lumbar spine demonstrate the patient be status post surgical posterior fusion of L3-4 and L4-5 with interbody fusion. Good alignment of vertebral bodies is noted. IMPRESSION: Status post surgical posterior fusion of L3-4 and L4-5. Electronically Signed   By: Marijo Conception, M.D.   On: 05/11/2018 14:42    Assessment/Plan: continue to mobilize and continue pain controll  Estimated body mass index is 31.41 kg/m as calculated from the following:   Height as of this encounter: 5\' 4"  (1.626 m).   Weight as of this encounter: 83 kg (183 lb).    LOS: 1 day    [name] S 05/12/2018, 10:03 AM

## 2018-05-12 NOTE — Progress Notes (Signed)
Physical Therapy Treatment Patient Details Name: Victoria Pena MRN: [id number] DOB: [birth year] Today's Date: 05/12/2018    History of Present Illness Pt is a 67 y/o female who presents s/p L3-L5 PLIF on 05/11/18.    PT Comments    Saw for second session this afternoon as pt was limited in the AM due to nausea. Was able to progress ambulation distance and overall tolerance for functional activity. Will plan to initiate stair training next session if pt can tolerate.   Follow Up Recommendations  No PT follow up;Supervision for mobility/OOB     Equipment Recommendations  Rolling walker with 5" wheels;3in1 (PT)    Recommendations for Other Services       Precautions / Restrictions Precautions Precautions: Fall;Back Precaution Booklet Issued: Yes (comment) Precaution Comments: Reviewed in detail with pt and adult granddaughter Required Braces or Orthoses: Spinal Brace Spinal Brace: Lumbar corset;Applied in sitting position Restrictions Weight Bearing Restrictions: No    Mobility  Bed Mobility Overal bed mobility: Needs Assistance Bed Mobility: Sidelying to Sit   Sidelying to sit: Supervision       General bed mobility comments: Pt demonstrated proper technique.   Transfers Overall transfer level: Needs assistance Equipment used: Rolling walker (2 wheeled) Transfers: Sit to/from Stand Sit to Stand: Min guard         General transfer comment: Close guard for safety. Pt demonstrated proper hand placement on seated surface for safety.   Ambulation/Gait Ambulation/Gait assistance: Min guard Ambulation Distance (Feet): 350 Feet Assistive device: Rolling walker (2 wheeled) Gait Pattern/deviations: Step-through pattern;Decreased stride length;Trunk flexed Gait velocity: Decreased Gait velocity interpretation: <1.8 ft/sec, indicate of risk for recurrent falls General Gait Details: VC's for improved posture and general safety with the RW. Overall slow and somewhat  shaky as pt fatigued.    Stairs             Wheelchair Mobility    Modified Rankin (Stroke Patients Only)       Balance Overall balance assessment: Mild deficits observed, not formally tested                                          Cognition Arousal/Alertness: Awake/alert Behavior During Therapy: WFL for tasks assessed/performed Overall Cognitive Status: Within Functional Limits for tasks assessed                                        Exercises      General Comments        Pertinent Vitals/Pain Pain Assessment: 0-10 Pain Score: 7  Pain Location: Incision site Pain Descriptors / Indicators: Operative site guarding;Discomfort Pain Intervention(s): Monitored during session    Home Living                      Prior Function            PT Goals (current goals can now be found in the care plan section) Acute Rehab PT Goals Patient Stated Goal: Decrease pain and nausea, go home tomorrow PT Goal Formulation: With patient/family Time For Goal Achievement: 05/19/18 Potential to Achieve Goals: Good Progress towards PT goals: Progressing toward goals    Frequency    Min 5X/week      PT Plan Current plan remains appropriate    Co-evaluation  AM-PAC PT "6 Clicks" Daily Activity  Outcome Measure  Difficulty turning over in bed (including adjusting bedclothes, sheets and blankets)?: A Little Difficulty moving from lying on back to sitting on the side of the bed? : Unable Difficulty sitting down on and standing up from a chair with arms (e.g., wheelchair, bedside commode, etc,.)?: A Little Help needed moving to and from a bed to chair (including a wheelchair)?: A Little Help needed walking in hospital room?: A Little Help needed climbing 3-5 steps with a railing? : A Lot 6 Click Score: 15    End of Session Equipment Utilized During Treatment: Back brace Activity Tolerance: Patient tolerated  treatment well Patient left: with call bell/phone within reach;in chair Nurse Communication: Mobility status(Pt requests nausea medication) PT Visit Diagnosis: Unsteadiness on feet (R26.81);Pain;Difficulty in walking, not elsewhere classified (R26.2) Pain - part of body: (back)     Time: 4327-6147 PT Time Calculation (min) (ACUTE ONLY): 23 min  Charges:  $Gait Training: 23-37 mins                    G Codes:       Rolinda Roan, PT, DPT Acute Rehabilitation Services Pager: [phone]    Thelma Comp 05/12/2018, 3:49 PM

## 2018-05-13 MED ORDER — OXYCODONE HCL 10 MG PO TABS: 5.0000 mg | ORAL_TABLET | ORAL | 0 refills | Status: DC | PRN

## 2018-05-13 MED ORDER — METHOCARBAMOL 500 MG PO TABS: 500.0000 mg | ORAL_TABLET | Freq: Four times a day (QID) | ORAL | 1 refills | Status: DC | PRN

## 2018-05-13 MED ORDER — ONDANSETRON HCL 4 MG PO TABS: 4.0000 mg | ORAL_TABLET | Freq: Three times a day (TID) | ORAL | 0 refills | Status: DC | PRN

## 2018-05-13 NOTE — Discharge Instructions (Signed)

## 2018-05-13 NOTE — Progress Notes (Signed)
Occupational Therapy Treatment and Discharge Patient Details Name: Victoria Pena MRN: [id number] DOB: 05/17/1951 Today's Date: 05/13/2018    History of present illness Pt is a 67 y/o female who presents s/p L3-L5 PLIF on 05/11/18.   OT comments  This 67 yo female admitted and underwent above seen today for tub transfer with 3n1. Pt able to do this with min A and will have A of grand-daughter and daughter at home. No further OT needs, we will D/C from acute OT.   Follow Up Recommendations  No OT follow up;Supervision/Assistance - 24 hour    Equipment Recommendations  3 in 1 bedside commode       Precautions / Restrictions Precautions Precautions: Fall;Back Precaution Booklet Issued: Yes (comment) Required Braces or Orthoses: Spinal Brace Spinal Brace: Lumbar corset;Applied in sitting position Restrictions Weight Bearing Restrictions: No       Mobility Bed Mobility               General bed mobility comments: Pt sitting on EOB upon my arrival  Transfers Overall transfer level: Needs assistance Equipment used: Rolling walker (2 wheeled) Transfers: Sit to/from Stand Sit to Stand: Supervision                  ADL either performed or assessed with clinical judgement   ADL                                         General ADL Comments: Pt peformed tub transfer to 3n1 with side step approach with min A for LLE due to decreaed bend in knee from prior knee surgery.     Vision Patient Visual Report: No change from baseline            Cognition Arousal/Alertness: Awake/alert Behavior During Therapy: WFL for tasks assessed/performed Overall Cognitive Status: Within Functional Limits for tasks assessed                                                     Pertinent Vitals/ Pain       Pain Assessment: 0-10 Pain Score: 5  Pain Location: Incision site Pain Descriptors / Indicators: Sore;Operative site guarding Pain  Intervention(s): Monitored during session;Repositioned            Progress Toward Goals  OT Goals(current goals can now be found in the care plan section)  Progress towards OT goals: Goals met/education completed, patient discharged from OT     Plan Discharge plan remains appropriate       AM-PAC PT "6 Clicks" Daily Activity     Outcome Measure   Help from another person eating meals?: None Help from another person taking care of personal grooming?: A Little Help from another person toileting, which includes using toliet, bedpan, or urinal?: A Little Help from another person bathing (including washing, rinsing, drying)?: A Little Help from another person to put on and taking off regular upper body clothing?: A Little Help from another person to put on and taking off regular lower body clothing?: A Little 6 Click Score: 19    End of Session Equipment Utilized During Treatment: Gait belt;Rolling walker;Back brace  OT Visit Diagnosis: Unsteadiness on feet (R26.81);Pain Pain - part of body: (back)     Activity Tolerance Patient tolerated treatment well   Patient Left in bed;with call bell/phone within reach   Nurse Communication (pt asking about her morning meds--after speaking to RN I made pt aware they are due at 10:00AM)        Time: 0831-0902 OT Time Calculation (min): 31 min  Charges: OT General Charges $OT Visit: 1 Visit OT Treatments $Self Care/Home Management : 23-37 mins  Golden Circle, OTR/L [phone] 05/13/2018

## 2018-05-13 NOTE — Progress Notes (Signed)
Patient is discharged from room 3C02 at this time. Alert and in stable condition. IV site d/c'd and instructions read to patient and family with understanding verbalized. Left unit via wheelchair with all belongings at side.

## 2018-05-13 NOTE — Discharge Summary (Signed)
Physician Discharge Summary  Patient ID: Victoria Pena MRN: [id number] DOB/AGE: September 22, 1951 67 y.o.  Admit date: 05/11/2018 Discharge date: 05/13/2018  Admission Diagnoses:  Discharge Diagnoses:  Active Problems:   S/P lumbar spinal fusion   Discharged Condition: good  Hospital Course: Patient admitted to the hospital where she underwent uncomplicated lumbar decompression and fusion.  Postoperatively doing well.  Mobilizing with therapy.  Pain controlled.  Ready for discharge home.  Consults:   Significant Diagnostic Studies:   Treatments:   Discharge Exam: Blood pressure 103/63, pulse 91, temperature 97.6 F (36.4 C), temperature source Oral, resp. rate 16, height 5\' 4"  (1.626 m), weight 83 kg (183 lb), SpO2 96 %. Awake and alert.  Oriented and appropriate.  Cranial nerve function intact.  Motor and sensory function extremities normal.  Wound clean and dry.  Chest and abdomen benign.  Disposition: Discharge disposition: 01-Home or Self Care        Allergies as of 05/13/2018      Reactions   Dextromethorphan Hbr Shortness Of Breath   Keflex [cephalexin] Shortness Of Breath   Hard time breathing   Bactrim [sulfamethoxazole-trimethoprim] Itching   Hydrocodone Other (See Comments), Anxiety   Heart racing, hard to concentrate, face flushed   Hydroxyzine Other (See Comments), Swelling   Numbness in lips   Prednisone & Diphenhydramine Other (See Comments)   Face flushed, heart racing   Meloxicam Other (See Comments)   Headache, body aches   Moxifloxacin Rash   Percocet [oxycodone-acetaminophen] Itching   "sometimes I itch", can still take this      Medication List    TAKE these medications   acetaminophen 500 MG tablet Commonly known as:  TYLENOL Take 1,000 mg by mouth 2 (two) times daily as needed for mild pain.   albuterol 108 (90 Base) MCG/ACT inhaler Commonly known as:  PROVENTIL HFA;VENTOLIN HFA Inhale 2 puffs into the lungs every 4 (four) hours as  needed for wheezing or shortness of breath.   ALPRAZolam 0.5 MG tablet Commonly known as:  XANAX Take 0.25 mg by mouth 2 (two) times daily.   ampicillin 500 MG capsule Commonly known as:  PRINCIPEN Take 500 mg by mouth 4 (four) times daily.   aspirin 81 MG tablet Take 81 mg by mouth daily.   buPROPion 150 MG 24 hr tablet Commonly known as:  WELLBUTRIN XL Take 150 mg by mouth daily.   ibuprofen 200 MG tablet Commonly known as:  ADVIL,MOTRIN Take 600 mg by mouth 2 (two) times daily as needed for mild pain.   levothyroxine 125 MCG tablet Commonly known as:  SYNTHROID, LEVOTHROID Take 125 mcg by mouth daily before breakfast.   lidocaine 5 % Commonly known as:  LIDODERM Place 1 patch onto the skin daily. Remove & Discard patch within 12 hours or as directed by MD   losartan 50 MG tablet Commonly known as:  COZAAR Take 50 mg by mouth daily.   meloxicam 15 MG tablet Commonly known as:  MOBIC Take 1 tablet (15 mg total) by mouth daily. In the morning with food for pain and inflammation   methocarbamol 500 MG tablet Commonly known as:  ROBAXIN Take 1 tablet (500 mg total) by mouth every 6 (six) hours as needed for muscle spasms.   metroNIDAZOLE 0.75 % gel Commonly known as:  [place] COAT TO AFFECTED AREA TWICE A DAY as needed for rosacea   multivitamin with minerals Tabs tablet Take 1 tablet by mouth daily.   ondansetron 4 MG  tablet Commonly known as:  ZOFRAN Take 1 tablet (4 mg total) by mouth every 8 (eight) hours as needed for nausea or vomiting.   Oxycodone HCl 10 MG Tabs Take 0.5-1 tablets (5-10 mg total) by mouth every 4 (four) hours as needed for moderate pain.   oxyCODONE-acetaminophen 7.5-325 MG tablet Commonly known as:  PERCOCET Take 1 tablet by mouth every 6 (six) hours as needed for severe pain. Not relived with Meloxicam What changed:  additional instructions   pantoprazole 40 MG tablet Commonly known as:  PROTONIX Take 40 mg by mouth  daily.   REFRESH OP Place 1 drop into both eyes every 6 (six) hours as needed (dry eyes).   senna-docusate 8.6-50 MG tablet Commonly known as:  Senokot-S Take 2-3 tablets by mouth at bedtime as needed for mild constipation.            Durable Medical Equipment  (From admission, onward)        Start     Ordered   05/11/18 1634  DME Walker rolling  Once    Question:  Patient needs a walker to treat with the following condition  Answer:  S/P lumbar fusion   05/11/18 1634   05/11/18 1634  DME 3 n 1  Once     05/11/18 1634       Signed: Mallie Mussel A Skya Mccullum 05/13/2018, 7:48 AM

## 2018-05-13 NOTE — Progress Notes (Signed)
Physical Therapy Treatment and Discharge Patient Details Name: Victoria Pena MRN: [id number] DOB: 1951-05-07 Today's Date: 05/13/2018    History of Present Illness Pt is a 67 y/o female who presents s/p L3-L5 PLIF on 05/11/18.    PT Comments    Session focused on stair training to prepare for transition home. Patient able to negotiate 4 steps with bilateral railings without difficulty requiring supervision for safety. Recommending supervision for mobility at home and no PT follow up. Education completed and patient adequate for discharge home with no further acute care PT needs. PT signing off.   Follow Up Recommendations  No PT follow up;Supervision for mobility/OOB     Equipment Recommendations  Rolling walker with 5" wheels;3in1 (PT)    Recommendations for Other Services       Precautions / Restrictions Precautions Precautions: Fall;Back Precaution Booklet Issued: Yes (comment) Required Braces or Orthoses: Spinal Brace Spinal Brace: Lumbar corset;Applied in sitting position Restrictions Weight Bearing Restrictions: No    Mobility  Bed Mobility Overal bed mobility: Modified Independent             General bed mobility comments: good log roll technique  Transfers Overall transfer level: Needs assistance Equipment used: Rolling walker (2 wheeled) Transfers: Sit to/from Stand Sit to Stand: Min guard         General transfer comment: min guard due to low bed height  Ambulation/Gait Ambulation/Gait assistance: Modified independent (Device/Increase time) Ambulation Distance (Feet): 200 Feet Assistive device: Rolling walker (2 wheeled) Gait Pattern/deviations: Step-through pattern;Decreased stride length;Trunk flexed Gait velocity: Decreased   General Gait Details: Overall slow and mildly shaky. Mod reliance on RW   Stairs Stairs: Yes Stairs assistance: Supervision Stair Management: Two rails Number of Stairs: 4     Wheelchair Mobility     Modified Rankin (Stroke Patients Only)       Balance Overall balance assessment: Mild deficits observed, not formally tested                                          Cognition Arousal/Alertness: Awake/alert Behavior During Therapy: WFL for tasks assessed/performed Overall Cognitive Status: Within Functional Limits for tasks assessed                                        Exercises      General Comments        Pertinent Vitals/Pain Pain Assessment: 0-10 Pain Score: 5  Pain Location: Incision site Pain Descriptors / Indicators: Sore;Operative site guarding Pain Intervention(s): Monitored during session    Home Living                      Prior Function            PT Goals (current goals can now be found in the care plan section) Acute Rehab PT Goals PT Goal Formulation: With patient/family Time For Goal Achievement: 05/19/18 Potential to Achieve Goals: Good Progress towards PT goals: Goals met/education completed, patient discharged from PT    Frequency    Min 5X/week      PT Plan Current plan remains appropriate    Co-evaluation              AM-PAC PT "6 Clicks" Daily Activity  Outcome Measure  Difficulty turning over  in bed (including adjusting bedclothes, sheets and blankets)?: A Little Difficulty moving from lying on back to sitting on the side of the bed? : A Lot Difficulty sitting down on and standing up from a chair with arms (e.g., wheelchair, bedside commode, etc,.)?: A Little Help needed moving to and from a bed to chair (including a wheelchair)?: A Little Help needed walking in hospital room?: A Little Help needed climbing 3-5 steps with a railing? : A Little 6 Click Score: 17    End of Session Equipment Utilized During Treatment: Back brace Activity Tolerance: Patient tolerated treatment well Patient left: with call bell/phone within reach;in chair Nurse Communication: Mobility  status PT Visit Diagnosis: Unsteadiness on feet (R26.81);Pain;Difficulty in walking, not elsewhere classified (R26.2) Pain - part of body: (back)     Time: 4136-4383 PT Time Calculation (min) (ACUTE ONLY): 17 min  Charges:  $Therapeutic Activity: 8-22 mins                    G Codes:      Ellamae Sia, PT, DPT Acute Rehabilitation Services  Pager: [phone]   Willy Eddy 05/13/2018, 10:41 AM

## 2018-05-17 MED FILL — Heparin Sodium (Porcine) Inj 1000 Unit/ML: INTRAMUSCULAR | Qty: 30 | Status: AC

## 2018-05-17 MED FILL — Sodium Chloride IV Soln 0.9%: INTRAVENOUS | Qty: 1000 | Status: AC

## 2018-05-22 ENCOUNTER — Other Ambulatory Visit: Payer: Medicare HMO

## 2018-05-24 ENCOUNTER — Ambulatory Visit: Payer: Medicare HMO | Admitting: Internal Medicine

## 2018-06-26 DIAGNOSIS — M4316 Spondylolisthesis, lumbar region: Secondary | ICD-10-CM | POA: Diagnosis not present

## 2018-06-28 ENCOUNTER — Other Ambulatory Visit: Payer: Self-pay | Admitting: Neurological Surgery

## 2018-06-28 DIAGNOSIS — M4316 Spondylolisthesis, lumbar region: Secondary | ICD-10-CM

## 2018-07-10 ENCOUNTER — Ambulatory Visit
Admission: RE | Admit: 2018-07-10 | Discharge: 2018-07-10 | Disposition: A | Discharge status: Home / Self Care | Payer: Medicare HMO | Source: Ambulatory Visit | Attending: Neurological Surgery | Admitting: Neurological Surgery

## 2018-07-10 DIAGNOSIS — M5127 Other intervertebral disc displacement, lumbosacral region: Secondary | ICD-10-CM | POA: Diagnosis not present

## 2018-07-10 DIAGNOSIS — M4316 Spondylolisthesis, lumbar region: Secondary | ICD-10-CM

## 2018-07-17 ENCOUNTER — Telehealth: Payer: Self-pay | Admitting: *Deleted

## 2018-07-17 ENCOUNTER — Other Ambulatory Visit: Payer: Self-pay | Admitting: *Deleted

## 2018-07-17 DIAGNOSIS — C3491 Malignant neoplasm of unspecified part of right bronchus or lung: Secondary | ICD-10-CM

## 2018-07-17 NOTE — Telephone Encounter (Signed)
Pt called regarding missed lab/scan appt. Pt had back surgery and missed last appts. Requesting to r/s. New CT Chest order entered, message to scheduling for pt appts.

## 2018-07-18 ENCOUNTER — Telehealth: Payer: Self-pay | Admitting: Internal Medicine

## 2018-07-18 NOTE — Telephone Encounter (Signed)
Scheduled appt per 7/29 sch messag e- pt is aware of appt date and time - number for [facility] given to patient to contact them for ct - patient aware - [facility] should contact them with ct schedule.

## 2018-07-31 ENCOUNTER — Ambulatory Visit (HOSPITAL_COMMUNITY): Payer: Medicare HMO

## 2018-07-31 ENCOUNTER — Inpatient Hospital Stay: Payer: Medicare HMO

## 2018-07-31 DIAGNOSIS — M7061 Trochanteric bursitis, right hip: Secondary | ICD-10-CM | POA: Diagnosis not present

## 2018-07-31 DIAGNOSIS — M545 Low back pain: Secondary | ICD-10-CM | POA: Diagnosis not present

## 2018-08-07 ENCOUNTER — Ambulatory Visit: Payer: Medicare HMO | Admitting: Oncology

## 2018-08-14 ENCOUNTER — Other Ambulatory Visit: Payer: Self-pay | Admitting: Legal Medicine

## 2018-08-14 DIAGNOSIS — R921 Mammographic calcification found on diagnostic imaging of breast: Secondary | ICD-10-CM

## 2018-08-17 ENCOUNTER — Ambulatory Visit
Admission: RE | Admit: 2018-08-17 | Discharge: 2018-08-17 | Disposition: A | Discharge status: Home / Self Care | Payer: Medicare HMO | Source: Ambulatory Visit | Attending: Legal Medicine | Admitting: Legal Medicine

## 2018-08-17 DIAGNOSIS — R921 Mammographic calcification found on diagnostic imaging of breast: Secondary | ICD-10-CM | POA: Diagnosis not present

## 2018-08-17 DIAGNOSIS — N6011 Diffuse cystic mastopathy of right breast: Secondary | ICD-10-CM | POA: Diagnosis not present

## 2018-08-17 HISTORY — PX: BREAST BIOPSY: SHX20

## 2018-08-23 DIAGNOSIS — I10 Essential (primary) hypertension: Secondary | ICD-10-CM | POA: Diagnosis not present

## 2018-08-23 DIAGNOSIS — G8929 Other chronic pain: Secondary | ICD-10-CM | POA: Diagnosis not present

## 2018-08-23 DIAGNOSIS — N3000 Acute cystitis without hematuria: Secondary | ICD-10-CM | POA: Diagnosis not present

## 2018-08-23 DIAGNOSIS — N6089 Other benign mammary dysplasias of unspecified breast: Secondary | ICD-10-CM | POA: Diagnosis not present

## 2018-08-23 DIAGNOSIS — G4762 Sleep related leg cramps: Secondary | ICD-10-CM | POA: Diagnosis not present

## 2018-08-24 ENCOUNTER — Other Ambulatory Visit: Payer: Self-pay

## 2018-09-14 DIAGNOSIS — N6011 Diffuse cystic mastopathy of right breast: Secondary | ICD-10-CM | POA: Diagnosis not present

## 2018-09-14 DIAGNOSIS — E890 Postprocedural hypothyroidism: Secondary | ICD-10-CM | POA: Diagnosis not present

## 2018-09-14 DIAGNOSIS — F17210 Nicotine dependence, cigarettes, uncomplicated: Secondary | ICD-10-CM | POA: Diagnosis not present

## 2018-09-14 DIAGNOSIS — R928 Other abnormal and inconclusive findings on diagnostic imaging of breast: Secondary | ICD-10-CM | POA: Diagnosis not present

## 2018-09-14 DIAGNOSIS — Z01810 Encounter for preprocedural cardiovascular examination: Secondary | ICD-10-CM

## 2018-09-14 DIAGNOSIS — K219 Gastro-esophageal reflux disease without esophagitis: Secondary | ICD-10-CM | POA: Diagnosis not present

## 2018-09-14 DIAGNOSIS — J449 Chronic obstructive pulmonary disease, unspecified: Secondary | ICD-10-CM | POA: Diagnosis not present

## 2018-09-14 DIAGNOSIS — N62 Hypertrophy of breast: Secondary | ICD-10-CM | POA: Diagnosis not present

## 2018-09-14 DIAGNOSIS — E559 Vitamin D deficiency, unspecified: Secondary | ICD-10-CM | POA: Diagnosis not present

## 2018-09-14 DIAGNOSIS — N3281 Overactive bladder: Secondary | ICD-10-CM | POA: Diagnosis not present

## 2018-09-14 DIAGNOSIS — F418 Other specified anxiety disorders: Secondary | ICD-10-CM | POA: Diagnosis not present

## 2018-09-14 DIAGNOSIS — N6081 Other benign mammary dysplasias of right breast: Secondary | ICD-10-CM | POA: Diagnosis not present

## 2018-09-14 DIAGNOSIS — I10 Essential (primary) hypertension: Secondary | ICD-10-CM | POA: Diagnosis not present

## 2018-09-21 DIAGNOSIS — Z09 Encounter for follow-up examination after completed treatment for conditions other than malignant neoplasm: Secondary | ICD-10-CM | POA: Diagnosis not present

## 2018-09-28 DIAGNOSIS — Z23 Encounter for immunization: Secondary | ICD-10-CM | POA: Diagnosis not present

## 2018-09-28 DIAGNOSIS — N644 Mastodynia: Secondary | ICD-10-CM | POA: Diagnosis not present

## 2018-09-28 DIAGNOSIS — M5116 Intervertebral disc disorders with radiculopathy, lumbar region: Secondary | ICD-10-CM | POA: Diagnosis not present

## 2018-10-11 DIAGNOSIS — Z87891 Personal history of nicotine dependence: Secondary | ICD-10-CM | POA: Diagnosis not present

## 2018-10-11 DIAGNOSIS — M25562 Pain in left knee: Secondary | ICD-10-CM | POA: Diagnosis not present

## 2018-10-11 DIAGNOSIS — Z96659 Presence of unspecified artificial knee joint: Secondary | ICD-10-CM | POA: Diagnosis not present

## 2018-11-12 DIAGNOSIS — Z902 Acquired absence of lung [part of]: Secondary | ICD-10-CM | POA: Diagnosis not present

## 2018-11-12 DIAGNOSIS — J449 Chronic obstructive pulmonary disease, unspecified: Secondary | ICD-10-CM | POA: Diagnosis not present

## 2018-11-12 DIAGNOSIS — R05 Cough: Secondary | ICD-10-CM | POA: Diagnosis not present

## 2018-11-12 DIAGNOSIS — Z85118 Personal history of other malignant neoplasm of bronchus and lung: Secondary | ICD-10-CM | POA: Diagnosis not present

## 2018-11-12 DIAGNOSIS — Z79899 Other long term (current) drug therapy: Secondary | ICD-10-CM | POA: Diagnosis not present

## 2018-11-12 DIAGNOSIS — J0110 Acute frontal sinusitis, unspecified: Secondary | ICD-10-CM | POA: Diagnosis not present

## 2018-11-12 DIAGNOSIS — Z87891 Personal history of nicotine dependence: Secondary | ICD-10-CM | POA: Diagnosis not present

## 2018-11-12 DIAGNOSIS — I10 Essential (primary) hypertension: Secondary | ICD-10-CM | POA: Diagnosis not present

## 2018-11-12 DIAGNOSIS — J069 Acute upper respiratory infection, unspecified: Secondary | ICD-10-CM | POA: Diagnosis not present

## 2018-11-22 DIAGNOSIS — I10 Essential (primary) hypertension: Secondary | ICD-10-CM | POA: Diagnosis not present

## 2018-11-22 DIAGNOSIS — F329 Major depressive disorder, single episode, unspecified: Secondary | ICD-10-CM | POA: Diagnosis not present

## 2018-11-22 DIAGNOSIS — E038 Other specified hypothyroidism: Secondary | ICD-10-CM | POA: Diagnosis not present

## 2018-11-22 DIAGNOSIS — M545 Low back pain: Secondary | ICD-10-CM | POA: Diagnosis not present

## 2018-11-22 DIAGNOSIS — Z6827 Body mass index (BMI) 27.0-27.9, adult: Secondary | ICD-10-CM | POA: Diagnosis not present

## 2018-12-01 IMAGING — CR DG CHEST 2V
2 series · 2 of 2 positions shown · non-contrast
Comparison: Chest x-ray of 04/01/2014 and CT chest of 11/22/2017

CLINICAL DATA: Preop for lumbar spine fusion, history of lung
carcinoma with right upper lobectomy

EXAM:
CHEST - 2 VIEW

[w chest pa]
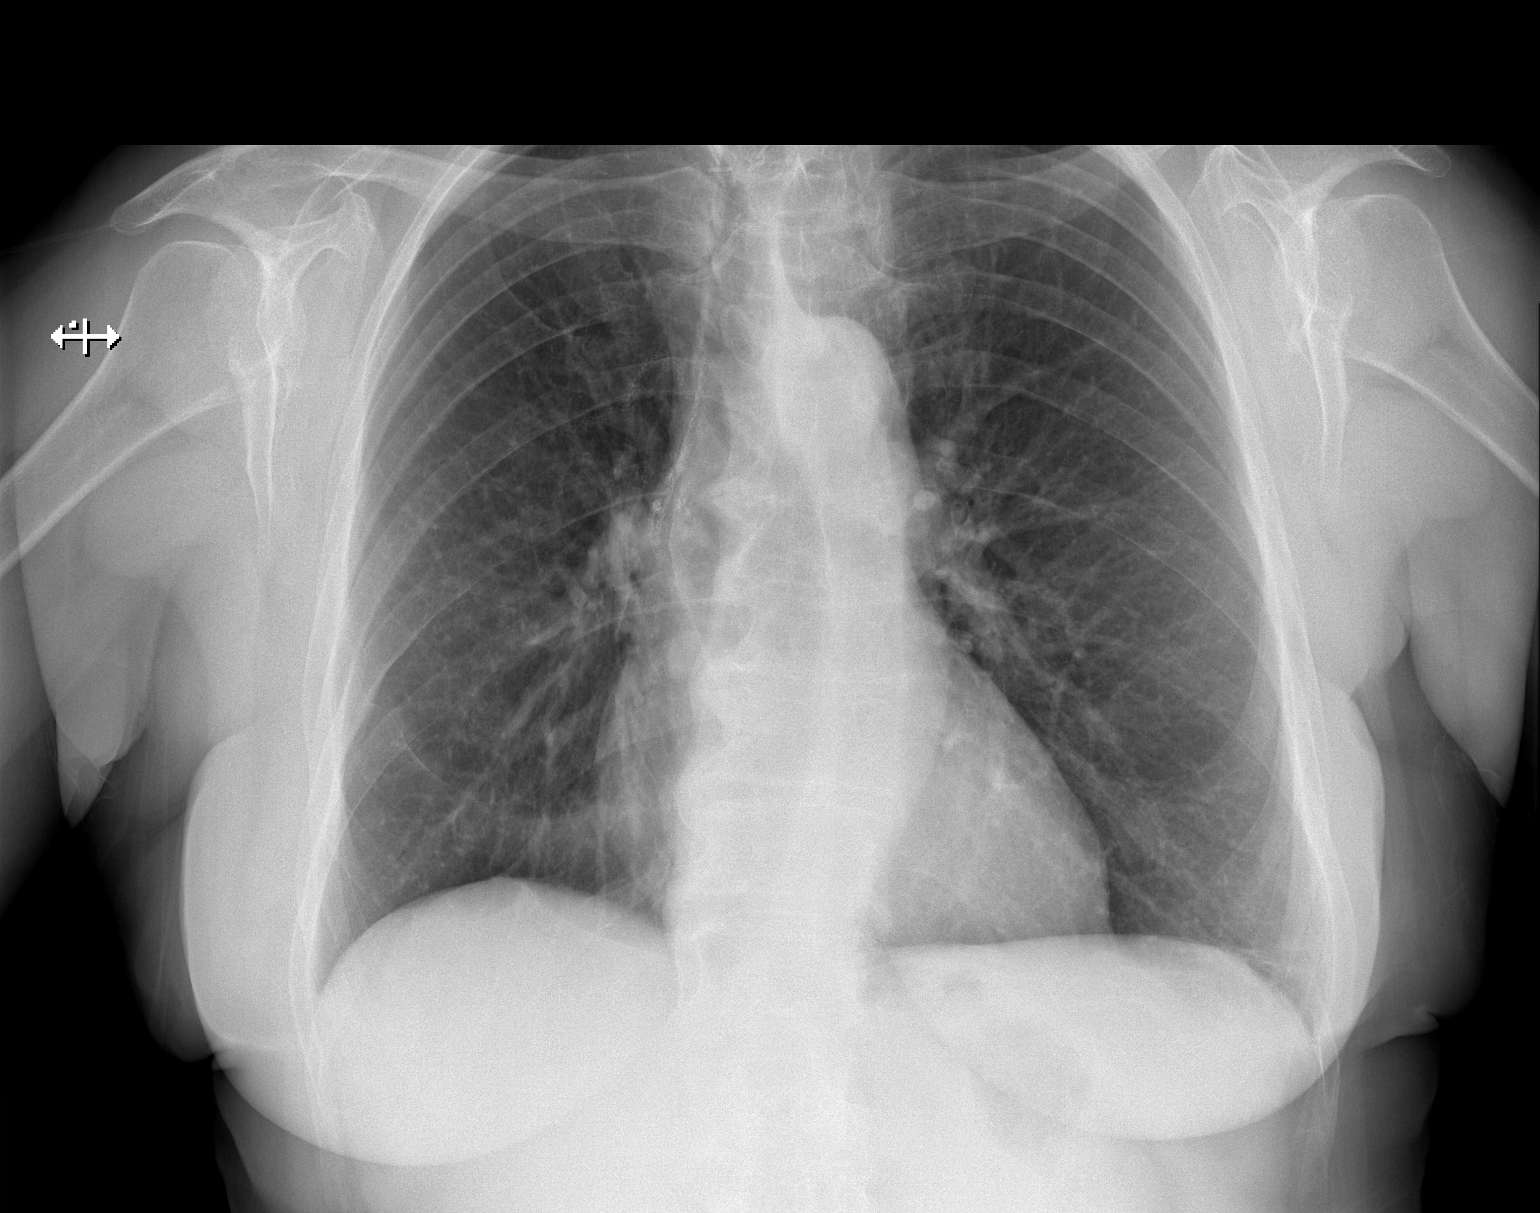

[w chest lat]
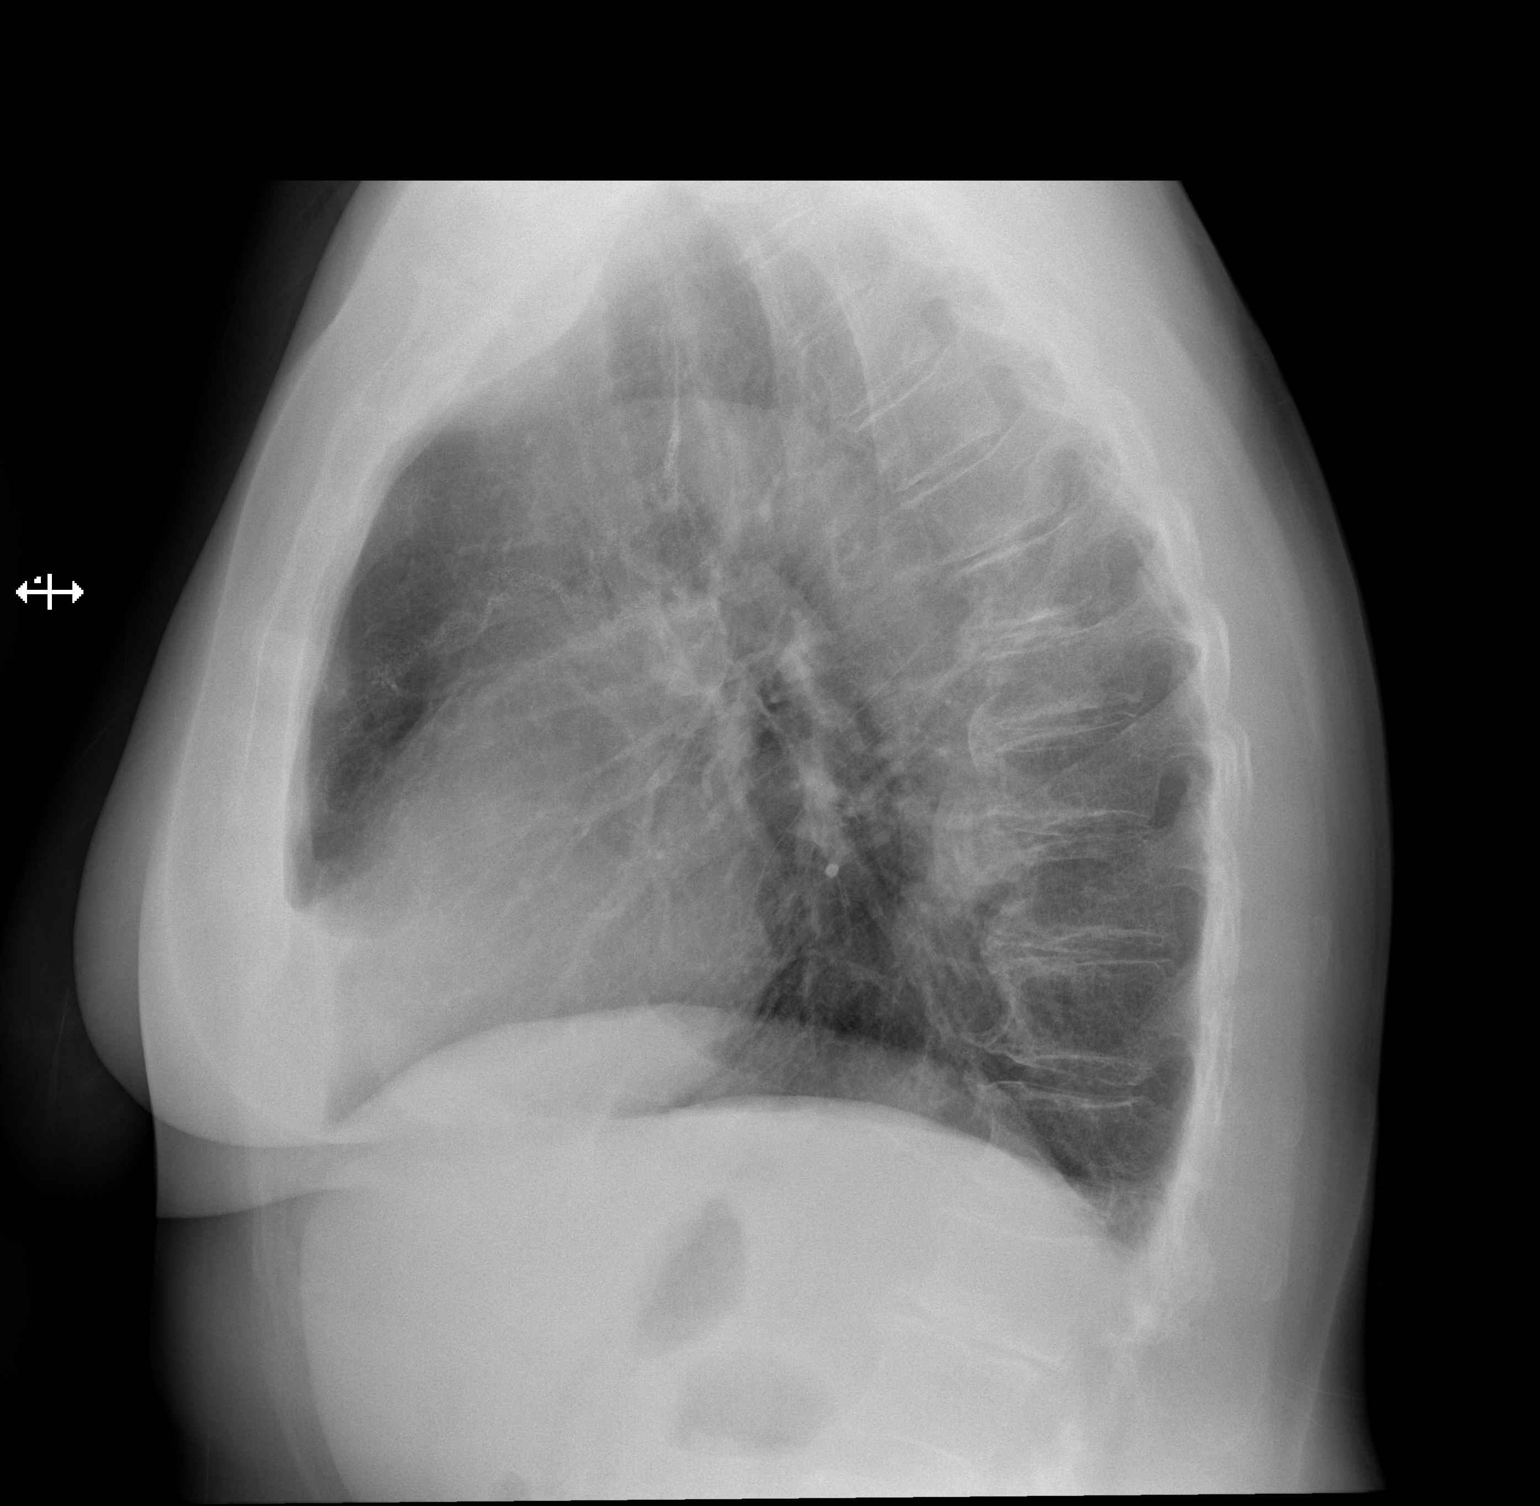

[2 of 2 positions shown; findings below may reference images not displayed]

FINDINGS: No active infiltrate or effusion is seen. Surgical clips overlie the
right hilum after prior right upper lobectomy. Mediastinal and hilar
contours are unremarkable and heart size is normal. There are
degenerative changes in the mid to lower thoracic spine.
IMPRESSION: No active cardiopulmonary disease. Stable postop change on the
right.

## 2019-01-10 ENCOUNTER — Telehealth: Payer: Self-pay | Admitting: Internal Medicine

## 2019-01-10 NOTE — Telephone Encounter (Signed)
Tried to reach regarding voicemail °

## 2019-01-22 DIAGNOSIS — H2513 Age-related nuclear cataract, bilateral: Secondary | ICD-10-CM | POA: Diagnosis not present

## 2019-01-23 ENCOUNTER — Other Ambulatory Visit: Payer: Self-pay | Admitting: *Deleted

## 2019-01-23 DIAGNOSIS — C3491 Malignant neoplasm of unspecified part of right bronchus or lung: Secondary | ICD-10-CM

## 2019-01-24 ENCOUNTER — Inpatient Hospital Stay: Payer: Medicare HMO | Attending: Internal Medicine

## 2019-01-24 ENCOUNTER — Ambulatory Visit (HOSPITAL_COMMUNITY)
Admission: RE | Admit: 2019-01-24 | Discharge: 2019-01-24 | Disposition: A | Discharge status: Home / Self Care | Payer: Medicare HMO | Source: Ambulatory Visit | Attending: Internal Medicine | Admitting: Internal Medicine

## 2019-01-24 DIAGNOSIS — I10 Essential (primary) hypertension: Secondary | ICD-10-CM | POA: Insufficient documentation

## 2019-01-24 DIAGNOSIS — Z7982 Long term (current) use of aspirin: Secondary | ICD-10-CM | POA: Insufficient documentation

## 2019-01-24 DIAGNOSIS — C3491 Malignant neoplasm of unspecified part of right bronchus or lung: Secondary | ICD-10-CM

## 2019-01-24 DIAGNOSIS — Z9221 Personal history of antineoplastic chemotherapy: Secondary | ICD-10-CM | POA: Diagnosis not present

## 2019-01-24 DIAGNOSIS — Z791 Long term (current) use of non-steroidal anti-inflammatories (NSAID): Secondary | ICD-10-CM | POA: Diagnosis not present

## 2019-01-24 DIAGNOSIS — Z79899 Other long term (current) drug therapy: Secondary | ICD-10-CM | POA: Insufficient documentation

## 2019-01-24 DIAGNOSIS — C3411 Malignant neoplasm of upper lobe, right bronchus or lung: Secondary | ICD-10-CM | POA: Insufficient documentation

## 2019-01-24 DIAGNOSIS — C3490 Malignant neoplasm of unspecified part of unspecified bronchus or lung: Secondary | ICD-10-CM | POA: Diagnosis not present

## 2019-01-24 LAB — CBC WITH DIFFERENTIAL (CANCER CENTER ONLY)
ABS IMMATURE GRANULOCYTES: 0.01 10*3/uL (ref 0.00–0.07)
Basophils Absolute: 0 10*3/uL (ref 0.0–0.1)
Basophils Relative: 1 %
Eosinophils Absolute: 0.1 10*3/uL (ref 0.0–0.5)
Eosinophils Relative: 2 %
HEMATOCRIT: 42.1 % (ref 36.0–46.0)
Hemoglobin: 12.6 g/dL (ref 12.0–15.0)
IMMATURE GRANULOCYTES: 0 %
LYMPHS ABS: 1.3 10*3/uL (ref 0.7–4.0)
Lymphocytes Relative: 30 %
MCH: 25.8 pg — ABNORMAL LOW (ref 26.0–34.0)
MCHC: 29.9 g/dL — ABNORMAL LOW (ref 30.0–36.0)
MCV: 86.3 fL (ref 80.0–100.0)
MONOS PCT: 9 %
Monocytes Absolute: 0.4 10*3/uL (ref 0.1–1.0)
NEUTROS ABS: 2.5 10*3/uL (ref 1.7–7.7)
NEUTROS PCT: 58 %
PLATELETS: 270 10*3/uL (ref 150–400)
RBC: 4.88 MIL/uL (ref 3.87–5.11)
RDW: 15.1 % (ref 11.5–15.5)
WBC Count: 4.3 10*3/uL (ref 4.0–10.5)
nRBC: 0 % (ref 0.0–0.2)

## 2019-01-24 LAB — CMP (CANCER CENTER ONLY)
ALT: 12 U/L (ref 0–44)
AST: 14 U/L — AB (ref 15–41)
Albumin: 3.7 g/dL (ref 3.5–5.0)
Alkaline Phosphatase: 127 U/L — ABNORMAL HIGH (ref 38–126)
Anion gap: 6 (ref 5–15)
BUN: 20 mg/dL (ref 8–23)
CHLORIDE: 107 mmol/L (ref 98–111)
CO2: 28 mmol/L (ref 22–32)
Calcium: 9.6 mg/dL (ref 8.9–10.3)
Creatinine: 0.75 mg/dL (ref 0.44–1.00)
Glucose, Bld: 97 mg/dL (ref 70–99)
Potassium: 4.3 mmol/L (ref 3.5–5.1)
Sodium: 141 mmol/L (ref 135–145)
Total Bilirubin: 0.5 mg/dL (ref 0.3–1.2)
Total Protein: 6.8 g/dL (ref 6.5–8.1)

## 2019-01-24 MED ORDER — SODIUM CHLORIDE (PF) 0.9 % IJ SOLN
INTRAMUSCULAR | Status: AC
  Filled 2019-01-24: qty 50

## 2019-01-24 MED ORDER — IOHEXOL 300 MG/ML  SOLN
75.0000 mL | Freq: Once | INTRAMUSCULAR | Status: AC | PRN
  Administered 2019-01-24: 75 mL via INTRAVENOUS

## 2019-01-25 ENCOUNTER — Encounter: Payer: Self-pay | Admitting: Internal Medicine

## 2019-01-25 ENCOUNTER — Inpatient Hospital Stay: Payer: Medicare HMO

## 2019-01-25 ENCOUNTER — Inpatient Hospital Stay (HOSPITAL_BASED_OUTPATIENT_CLINIC_OR_DEPARTMENT_OTHER): Payer: Medicare HMO | Admitting: Internal Medicine

## 2019-01-25 VITALS — BP 130/83 | HR 72 | Temp 98.3°F | Resp 18 | Wt 166.0 lb

## 2019-01-25 DIAGNOSIS — C3490 Malignant neoplasm of unspecified part of unspecified bronchus or lung: Secondary | ICD-10-CM

## 2019-01-25 DIAGNOSIS — Z79899 Other long term (current) drug therapy: Secondary | ICD-10-CM | POA: Diagnosis not present

## 2019-01-25 DIAGNOSIS — C3491 Malignant neoplasm of unspecified part of right bronchus or lung: Secondary | ICD-10-CM

## 2019-01-25 DIAGNOSIS — Z7982 Long term (current) use of aspirin: Secondary | ICD-10-CM

## 2019-01-25 DIAGNOSIS — Z791 Long term (current) use of non-steroidal anti-inflammatories (NSAID): Secondary | ICD-10-CM | POA: Diagnosis not present

## 2019-01-25 DIAGNOSIS — C3411 Malignant neoplasm of upper lobe, right bronchus or lung: Secondary | ICD-10-CM | POA: Diagnosis not present

## 2019-01-25 DIAGNOSIS — I10 Essential (primary) hypertension: Secondary | ICD-10-CM | POA: Diagnosis not present

## 2019-01-25 DIAGNOSIS — Z9221 Personal history of antineoplastic chemotherapy: Secondary | ICD-10-CM

## 2019-01-25 NOTE — Progress Notes (Signed)
[facility] Telephone:([phone]) [phone]   Fax:([phone]) [phone]  OFFICE PROGRESS NOTE  Lillard Anes, MD [address] [place] [address]  DIAGNOSIS: stage IB (T2a, N0, M0) non-small cell lung cancer, adenocarcinoma diagnosed in July 2016  PRIOR THERAPY: 1) status post right upper lobectomy with lymph node dissection on 07/22/2015 in [place]. 2) adjuvant systemic chemotherapy with cisplatin and Alimta status post 4 cycles completed in November 2016.  CURRENT THERAPY: Observation.  INTERVAL HISTORY: Victoria Pena 68 y.o. female returns to the clinic today for follow-up visit.  The patient is feeling fine today with no concerning complaints.  She underwent several surgeries in the last 12 months.  She is feeling better today.  She denied having any chest pain but has shortness of breath with exertion with no significant cough or hemoptysis.  She denied having any nausea, vomiting, diarrhea or constipation.  She intentionally lost few pounds.  She had repeat CT scan of the chest performed recently and she is here for evaluation and discussion of her scan results.  MEDICAL HISTORY: Past Medical History:  Diagnosis Date  . Adenocarcinoma of right lung, stage 1 ([place]) 07/26/2016  . Anxiety   . Arthritis   . COPD (chronic obstructive pulmonary disease) ([place])    pt denies  . Depression   . GERD (gastroesophageal reflux disease)   . Hypertension   . Sleep apnea    "very very mild" no CPAP  . Thyroid disease     ALLERGIES:  is allergic to dextromethorphan hbr; keflex [cephalexin]; bactrim [sulfamethoxazole-trimethoprim]; hydrocodone; hydroxyzine; prednisone & diphenhydramine; meloxicam; moxifloxacin; and percocet [oxycodone-acetaminophen].  MEDICATIONS:  Current Outpatient Medications  Medication Sig Dispense Refill  . acetaminophen (TYLENOL) 500 MG tablet Take 1,000 mg by mouth 2 (two) times daily as needed for mild pain.    Marland Kitchen  albuterol (PROVENTIL HFA;VENTOLIN HFA) 108 (90 Base) MCG/ACT inhaler Inhale 2 puffs into the lungs every 4 (four) hours as needed for wheezing or shortness of breath.     . ALPRAZolam (XANAX) 0.5 MG tablet Take 0.25 mg by mouth 2 (two) times daily.     Marland Kitchen ampicillin (PRINCIPEN) 500 MG capsule Take 500 mg by mouth 4 (four) times daily.    Marland Kitchen aspirin 81 MG tablet Take 81 mg by mouth daily.    Marland Kitchen buPROPion (WELLBUTRIN XL) 150 MG 24 hr tablet Take 150 mg by mouth daily.    Mar[place] Kitchen ibuprofen (ADVIL,MOTRIN) 200 MG tablet Take 600 mg by mouth 2 (two) times daily as needed for mild pain.    Marland Kitchen levothyroxine (SYNTHROID, LEVOTHROID) 125 MCG tablet Take 125 mcg by mouth daily before breakfast.    . lidocaine (LIDODERM) 5 % Place 1 patch onto the skin daily. Remove & Discard patch within 12 hours or as directed by MD (Patient not taking: Reported on 04/27/2018) 30 patch 0  . losartan (COZAAR) 50 MG tablet Take 50 mg by mouth daily.  0  . meloxicam (MOBIC) 15 MG tablet Take 1 tablet (15 mg total) by mouth daily. In the morning with food for pain and inflammation (Patient not taking: Reported on 04/27/2018) 30 tablet 0  . methocarbamol (ROBAXIN) 500 MG tablet Take 1 tablet (500 mg total) by mouth every 6 (six) hours as needed for muscle spasms. 40 tablet 1  . metroNIDAZOLE (METROGEL) 0.75 % gel APPLY THIN COAT TO AFFECTED AREA TWICE A DAY as needed for rosacea  5  . Multiple Vitamin (MULTIVITAMIN WITH  MINERALS) TABS tablet Take 1 tablet by mouth daily.    . ondansetron (ZOFRAN) 4 MG tablet Take 1 tablet (4 mg total) by mouth every 8 (eight) hours as needed for nausea or vomiting. 20 tablet 0  . oxyCODONE 10 MG TABS Take 0.5-1 tablets (5-10 mg total) by mouth every 4 (four) hours as needed for moderate pain. 50 tablet 0  . oxyCODONE-acetaminophen (PERCOCET) 7.5-325 MG tablet Take 1 tablet by mouth every 6 (six) hours as needed for severe pain. Not relived with Meloxicam (Patient taking differently: Take 1 tablet by mouth every 6  (six) hours as needed for severe pain. ) 20 tablet 0  . pantoprazole (PROTONIX) 40 MG tablet Take 40 mg by mouth daily.    . Polyvinyl Alcohol-Povidone (REFRESH OP) Place 1 drop into both eyes every 6 (six) hours as needed (dry eyes).    Marland Kitchen senna-docusate (SENOKOT-S) 8.6-50 MG tablet Take 2-3 tablets by mouth at bedtime as needed for mild constipation.     No current facility-administered medications for this visit.     SURGICAL HISTORY:  Past Surgical History:  Procedure Laterality Date  . ANKLE SURGERY     bilateral  . APPENDECTOMY    . CHOLECYSTECTOMY    . REPLACEMENT TOTAL KNEE Left   . THYROIDECTOMY      REVIEW OF SYSTEMS:  A comprehensive review of systems was negative except for: Respiratory: positive for dyspnea on exertion   PHYSICAL EXAMINATION: General appearance: alert, cooperative and no distress Head: Normocephalic, without obvious abnormality, atraumatic Neck: no adenopathy, no JVD, supple, symmetrical, trachea midline and thyroid not enlarged, symmetric, no tenderness/mass/nodules Lymph nodes: Cervical, supraclavicular, and axillary nodes normal. Resp: clear to auscultation bilaterally Back: symmetric, no curvature. ROM normal. No CVA tenderness. Cardio: regular rate and rhythm, S1, S2 normal, no murmur, click, rub or gallop GI: soft, non-tender; bowel sounds normal; no masses,  no organomegaly Extremities: extremities normal, atraumatic, no cyanosis or edema  ECOG PERFORMANCE STATUS: 1 - Symptomatic but completely ambulatory  Blood pressure 130/83, pulse 72, temperature 98.3 F (36.8 C), temperature source Oral, resp. rate 18, weight 166 lb (75.3 kg), SpO2 98 %.  LABORATORY DATA: Lab Results  Component Value Date   WBC 4.3 01/24/2019   HGB 12.6 01/24/2019   HCT 42.1 01/24/2019   MCV 86.3 01/24/2019   PLT 270 01/24/2019      Chemistry      Component Value Date/Time   NA 141 01/24/2019 1046   NA 140 11/22/2017 1451   K 4.3 01/24/2019 1046   K 3.9  11/22/2017 1451   CL 107 01/24/2019 1046   CO2 28 01/24/2019 1046   CO2 26 11/22/2017 1451   BUN 20 01/24/2019 1046   BUN 14.5 11/22/2017 1451   CREATININE 0.75 01/24/2019 1046   CREATININE 0.8 11/22/2017 1451      Component Value Date/Time   CALCIUM 9.6 01/24/2019 1046   CALCIUM 9.1 11/22/2017 1451   ALKPHOS 127 (H) 01/24/2019 1046   ALKPHOS 122 11/22/2017 1451   AST 14 (L) 01/24/2019 1046   AST 13 11/22/2017 1451   ALT 12 01/24/2019 1046   ALT 13 11/22/2017 1451   BILITOT 0.5 01/24/2019 1046   BILITOT 0.48 11/22/2017 1451       RADIOGRAPHIC STUDIES: Ct Chest W Contrast  Result Date: 01/24/2019 CLINICAL DATA:  Lung cancer restaging. Short of breath and back pain RIGHT upper lobectomy EXAM: CT CHEST WITH CONTRAST TECHNIQUE: Multidetector CT imaging of the chest was performed during  intravenous contrast administration. CONTRAST:  22mL OMNIPAQUE IOHEXOL 300 MG/ML  SOLN COMPARISON:  CT chest 11/22/2017 FINDINGS: Cardiovascular: No significant vascular findings. Normal heart size. No pericardial effusion. Low-attenuation pericardial cysts deep to the inferior IVC and LEFT atrium. Increased in size from prior (image 105/2). Mediastinum/Nodes: No axillary or supraclavicular adenopathy. No mediastinal hilar adenopathy. No pericardial fluid. Esophagus normal. Lungs/Pleura: LEFT upper lobectomy. RIGHT upper lobectomy. No nodularity along the surgical margin. No new pulmonary nodules. Airways normal. Upper Abdomen: Limited view of the liver, kidneys, pancreas are unremarkable. Normal adrenal glands. Musculoskeletal: No aggressive osseous lesion. IMPRESSION: 1. No evidence lung cancer recurrence. 2. Stable postoperative change in the RIGHT hemithorax. 3. Enlargement of pericardial cyst adjacent to the inferior vena cava. Electronically Signed   By: Suzy Bouchard M.D.   On: 01/24/2019 15:51    ASSESSMENT AND PLAN:  This is a very pleasant 68 years old white female with a stage IB non-small cell  lung cancer, adenocarcinoma status post right upper lobectomy with lymph node dissection followed by 4 cycles of adjuvant systemic chemotherapy with cisplatin and Alimta. The patient has been in observation since her adjuvant treatment. She had repeat CT scan of the chest performed recently.  I personally and independently reviewed the scans and discussed the results with the patient today. Her scan showed no concerning findings for disease recurrence or progression. I recommended for the patient to continue on observation with repeat CT scan of the chest in 1 year. She was advised to call immediately if she has any concerning symptoms in the interval. The patient voices understanding of current disease status and treatment options and is in agreement with the current care plan. All questions were answered. The patient knows to call the clinic with any problems, questions or concerns. We can certainly see the patient much sooner if necessary. I spent 10 minutes counseling the patient face to face. The total time spent in the appointment was 15 minutes. Disclaimer: This note was dictated with voice recognition software. Similar sounding words can inadvertently be transcribed and may not be corrected upon review.

## 2019-01-26 ENCOUNTER — Telehealth: Payer: Self-pay | Admitting: Internal Medicine

## 2019-01-26 NOTE — Telephone Encounter (Signed)
Scheduled appt per 2/6 los - sent reminder letter in the mail with appt date and time

## 2019-02-12 DIAGNOSIS — E0500 Thyrotoxicosis with diffuse goiter without thyrotoxic crisis or storm: Secondary | ICD-10-CM | POA: Diagnosis not present

## 2019-02-12 DIAGNOSIS — H02536 Eyelid retraction left eye, unspecified eyelid: Secondary | ICD-10-CM | POA: Diagnosis not present

## 2019-02-12 DIAGNOSIS — H02533 Eyelid retraction right eye, unspecified eyelid: Secondary | ICD-10-CM | POA: Diagnosis not present

## 2019-03-07 DIAGNOSIS — Z1231 Encounter for screening mammogram for malignant neoplasm of breast: Secondary | ICD-10-CM | POA: Diagnosis not present

## 2019-04-11 DIAGNOSIS — M47816 Spondylosis without myelopathy or radiculopathy, lumbar region: Secondary | ICD-10-CM

## 2019-04-11 HISTORY — DX: Spondylosis without myelopathy or radiculopathy, lumbar region: M47.816

## 2019-05-08 DIAGNOSIS — E038 Other specified hypothyroidism: Secondary | ICD-10-CM | POA: Diagnosis not present

## 2019-05-08 DIAGNOSIS — Z6827 Body mass index (BMI) 27.0-27.9, adult: Secondary | ICD-10-CM | POA: Diagnosis not present

## 2019-05-08 DIAGNOSIS — F329 Major depressive disorder, single episode, unspecified: Secondary | ICD-10-CM | POA: Diagnosis not present

## 2019-05-08 DIAGNOSIS — M545 Low back pain: Secondary | ICD-10-CM | POA: Diagnosis not present

## 2019-05-08 DIAGNOSIS — I10 Essential (primary) hypertension: Secondary | ICD-10-CM | POA: Diagnosis not present

## 2019-05-21 DIAGNOSIS — Z6826 Body mass index (BMI) 26.0-26.9, adult: Secondary | ICD-10-CM | POA: Diagnosis not present

## 2019-05-21 DIAGNOSIS — Z0000 Encounter for general adult medical examination without abnormal findings: Secondary | ICD-10-CM | POA: Diagnosis not present

## 2019-06-05 DIAGNOSIS — H02533 Eyelid retraction right eye, unspecified eyelid: Secondary | ICD-10-CM | POA: Diagnosis not present

## 2019-06-05 DIAGNOSIS — H02531 Eyelid retraction right upper eyelid: Secondary | ICD-10-CM | POA: Diagnosis not present

## 2019-06-05 DIAGNOSIS — H02536 Eyelid retraction left eye, unspecified eyelid: Secondary | ICD-10-CM | POA: Diagnosis not present

## 2019-06-05 DIAGNOSIS — H02534 Eyelid retraction left upper eyelid: Secondary | ICD-10-CM | POA: Diagnosis not present

## 2019-06-05 DIAGNOSIS — H5789 Other specified disorders of eye and adnexa: Secondary | ICD-10-CM | POA: Diagnosis not present

## 2019-06-05 DIAGNOSIS — Z01818 Encounter for other preprocedural examination: Secondary | ICD-10-CM | POA: Diagnosis not present

## 2019-06-07 DIAGNOSIS — Z1211 Encounter for screening for malignant neoplasm of colon: Secondary | ICD-10-CM | POA: Diagnosis not present

## 2019-08-08 DIAGNOSIS — H02402 Unspecified ptosis of left eyelid: Secondary | ICD-10-CM | POA: Diagnosis not present

## 2019-08-08 DIAGNOSIS — H02533 Eyelid retraction right eye, unspecified eyelid: Secondary | ICD-10-CM | POA: Diagnosis not present

## 2019-08-08 DIAGNOSIS — H02536 Eyelid retraction left eye, unspecified eyelid: Secondary | ICD-10-CM | POA: Diagnosis not present

## 2019-08-08 DIAGNOSIS — Z01818 Encounter for other preprocedural examination: Secondary | ICD-10-CM | POA: Diagnosis not present

## 2019-08-28 DIAGNOSIS — H02531 Eyelid retraction right upper eyelid: Secondary | ICD-10-CM | POA: Diagnosis not present

## 2019-08-28 DIAGNOSIS — H02422 Myogenic ptosis of left eyelid: Secondary | ICD-10-CM | POA: Diagnosis not present

## 2019-08-28 DIAGNOSIS — H02403 Unspecified ptosis of bilateral eyelids: Secondary | ICD-10-CM | POA: Diagnosis not present

## 2019-08-28 DIAGNOSIS — H02402 Unspecified ptosis of left eyelid: Secondary | ICD-10-CM | POA: Diagnosis not present

## 2019-09-05 ENCOUNTER — Ambulatory Visit: Payer: Medicare HMO | Admitting: Sports Medicine

## 2019-09-12 DIAGNOSIS — E038 Other specified hypothyroidism: Secondary | ICD-10-CM | POA: Diagnosis not present

## 2019-09-12 DIAGNOSIS — I10 Essential (primary) hypertension: Secondary | ICD-10-CM | POA: Diagnosis not present

## 2019-09-12 DIAGNOSIS — M5116 Intervertebral disc disorders with radiculopathy, lumbar region: Secondary | ICD-10-CM | POA: Diagnosis not present

## 2019-09-12 DIAGNOSIS — Z23 Encounter for immunization: Secondary | ICD-10-CM | POA: Diagnosis not present

## 2019-09-12 DIAGNOSIS — Z6827 Body mass index (BMI) 27.0-27.9, adult: Secondary | ICD-10-CM | POA: Diagnosis not present

## 2019-09-12 DIAGNOSIS — Z1159 Encounter for screening for other viral diseases: Secondary | ICD-10-CM | POA: Diagnosis not present

## 2019-09-12 DIAGNOSIS — F329 Major depressive disorder, single episode, unspecified: Secondary | ICD-10-CM | POA: Diagnosis not present

## 2019-09-12 DIAGNOSIS — K219 Gastro-esophageal reflux disease without esophagitis: Secondary | ICD-10-CM | POA: Diagnosis not present

## 2019-09-19 ENCOUNTER — Ambulatory Visit: Payer: Self-pay

## 2019-09-19 ENCOUNTER — Encounter: Payer: Self-pay | Admitting: Sports Medicine

## 2019-09-19 ENCOUNTER — Other Ambulatory Visit: Payer: Self-pay | Admitting: Sports Medicine

## 2019-09-19 ENCOUNTER — Other Ambulatory Visit: Payer: Self-pay

## 2019-09-19 ENCOUNTER — Ambulatory Visit (INDEPENDENT_AMBULATORY_CARE_PROVIDER_SITE_OTHER): Payer: Medicare HMO | Admitting: Sports Medicine

## 2019-09-19 DIAGNOSIS — M2141 Flat foot [pes planus] (acquired), right foot: Secondary | ICD-10-CM | POA: Diagnosis not present

## 2019-09-19 DIAGNOSIS — M79605 Pain in left leg: Secondary | ICD-10-CM | POA: Diagnosis not present

## 2019-09-19 DIAGNOSIS — M2142 Flat foot [pes planus] (acquired), left foot: Secondary | ICD-10-CM

## 2019-09-19 DIAGNOSIS — M779 Enthesopathy, unspecified: Secondary | ICD-10-CM

## 2019-09-19 DIAGNOSIS — M79672 Pain in left foot: Secondary | ICD-10-CM

## 2019-09-19 NOTE — Progress Notes (Signed)
Subjective: Victoria Pena is a 68 y.o. female patient who presents to office for evaluation of left posterior heel and ankle pain reports that pain is worse over the lateral side of the foot and ankle and the back of the heel occasionally wraps around and goes to the medial ankle as well patient is worried that with the more walking and standing that she does she may stress or tear some of the other tendons and affect her previous surgical site.  Patient is status post left posterior tibial tendon repair with Kidner procedure performed in October 2018.  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  Patient reports that she has also been trying to use Aspercreme to the area and reports that she has even remove the insoles from her shoes because the pain has been so bad that it is limiting by the end of the day.  Patient Active Problem List   Diagnosis Date Noted  . S/P lumbar spinal fusion 05/11/2018  . Anxiety 12/27/2016  . Benign essential hypertension 12/27/2016  . Depression 12/27/2016  . Graves disease 12/27/2016  . Palpitations 12/27/2016  . Adenocarcinoma of right lung, stage 1 ([place]) 07/26/2016    Current Outpatient Medications on File Prior to Visit  Medication Sig Dispense Refill  . albuterol (PROVENTIL HFA;VENTOLIN HFA) 108 (90 Base) MCG/ACT inhaler Inhale 2 puffs into the lungs every 4 (four) hours as needed for wheezing or shortness of breath.     . ALPRAZolam (XANAX) 0.5 MG tablet Take 0.25 mg by mouth 2 (two) times daily.     Marland Kitchen aspirin 81 MG tablet Take 81 mg by mouth daily.    Marland Kitchen buPROPion (WELLBUTRIN XL) 150 MG 24 hr tablet Take 150 mg by mouth daily.    . calcium carbonate (TUMS - DOSED IN MG ELEMENTAL CALCIUM) 500  MG chewable tablet Chew 1 tablet by mouth 2 (two) times daily.    . cholecalciferol (VITAMIN D) 25 MCG (1000 UT) tablet Take 1 tablet by mouth daily.    Marland Kitchen erythromycin ophthalmic ointment APPLY TO INCISIONS QID FOR WEEKS THEN DISCONTINUE    . ibuprofen (ADVIL,MOTRIN) 200 MG tablet Take 600 mg by mouth 2 (two) times daily as needed for mild pain.    Marland Kitchen levothyroxine (SYNTHROID, LEVOTHROID) 125 MCG tablet Take 125 mcg by mouth daily before breakfast.    . losartan (COZAAR) 50 MG tablet Take 50 mg by mouth daily.  0  . metroNIDAZOLE (METROGEL) 0.75 % gel APPLY THIN COAT TO AFFECTED AREA TWICE A DAY as needed for rosacea  5  . omeprazole (PRILOSEC) 40 MG capsule     . Spacer/Aero-Holding Chambers (AEROCHAMBER W/FLOWSIGNAL) inhaler Chamber use with Ventolin    . traMADol (ULTRAM) 50 MG tablet      No current facility-administered medications on file prior to visit.     Allergies  Allergen Reactions  . Dextromethorphan Hbr Shortness Of Breath  . Keflex [Cephalexin] Shortness Of Breath    Hard time breathing  . Robaxin [Methocarbamol] Nausea Only    Stomach pain . Could not eat  . Bactrim [Sulfamethoxazole-Trimethoprim] Itching  . Hydrocodone Other (See Comments) and Anxiety    Heart racing, hard to concentrate, face flushed  .  Hydroxyzine Other (See Comments) and Swelling    Numbness in lips  . Prednisone & Diphenhydramine Other (See Comments)    Face flushed, heart racing  . Ciprofloxacin   . Dextromethorphan Polistirex Er   . Meloxicam Other (See Comments)    Headache, body aches  . Nitrofurantoin   . Diphenhydramine Hcl Palpitations  . Moxifloxacin Rash  . Percocet [Oxycodone-Acetaminophen] Itching    "sometimes I itch", can still take this  . Prednisone Other (See Comments) and Palpitations    "flushing"     Objective:  General: Alert and oriented x3 in no acute distress  Dermatology: All surgical scars well-healed no open lesions bilateral lower extremities, no webspace  macerations, no ecchymosis bilateral, all nails x 10 are well manicured.  Vascular: Dorsalis Pedis and Posterior Tibial pedal pulses 1/4, Capillary Fill Time 5 seconds, + pedal hair growth bilateral, no edema bilateral lower extremities, Temperature gradient within normal limits.  Neurology: Johney Maine sensation intact via light touch bilateral.  Musculoskeletal: Mild tenderness with palpation at insertion of the Achilles on left, there is moderate tenderness palpation to the lateral ankle and at the level of the sinus tarsi, there is mild tenderness at the medial ankle with significant pes planus deformity with lateral ankle impingement on weightbearing on the left greater than the right.  Right leg is noted to be shorter as compared to the left.   Gait: Antalgic gait with increased heel off left  Xrays  Left foot   Impression: Normal osseous mineralization. Joint spaces preserved except at midtarsal joint where there is significant breech supportive of severe pes planus with forefoot abduction. No fracture/dislocation/boney destruction. Calcaneal spur present. Kager's triangle intact with no obliteration. No soft tissue abnormalities or radiopaque foreign bodies.   Assessment and Plan: Problem List Items Addressed This Visit    None    Visit Diagnoses    Tendonitis    -  Primary   Pes planus of both feet       Pain of left lower extremity          -Complete examination performed -Xrays reviewed -Discussed treatment options for tendinitis secondary to severe end-stage pes planus -Prednisone by mouth or injection cannot be given at this time due to patient reaction to this medication -Rx Tri-Lock brace for left and heel lift for right -Advised patient to continue with rest ice elevation Tylenol tramadol Aspercreme -No improvement will consider MRI -Recommend good supportive shoes daily -Patient to return to office 4 weeks or sooner if condition worsens.  Landis Martins, DPM

## 2019-09-26 ENCOUNTER — Other Ambulatory Visit: Payer: Self-pay | Admitting: Sports Medicine

## 2019-09-26 ENCOUNTER — Ambulatory Visit: Payer: Self-pay

## 2019-09-26 DIAGNOSIS — M722 Plantar fascial fibromatosis: Secondary | ICD-10-CM

## 2019-10-10 ENCOUNTER — Ambulatory Visit: Payer: Medicare HMO | Admitting: Sports Medicine

## 2019-10-31 ENCOUNTER — Ambulatory Visit: Payer: Medicare HMO | Admitting: Sports Medicine

## 2019-10-31 DIAGNOSIS — Z1159 Encounter for screening for other viral diseases: Secondary | ICD-10-CM | POA: Diagnosis not present

## 2019-10-31 DIAGNOSIS — Z6827 Body mass index (BMI) 27.0-27.9, adult: Secondary | ICD-10-CM | POA: Diagnosis not present

## 2019-10-31 DIAGNOSIS — R05 Cough: Secondary | ICD-10-CM | POA: Diagnosis not present

## 2019-10-31 DIAGNOSIS — J018 Other acute sinusitis: Secondary | ICD-10-CM | POA: Diagnosis not present

## 2020-01-09 DIAGNOSIS — M791 Myalgia, unspecified site: Secondary | ICD-10-CM | POA: Diagnosis not present

## 2020-01-09 DIAGNOSIS — Z20822 Contact with and (suspected) exposure to covid-19: Secondary | ICD-10-CM | POA: Diagnosis not present

## 2020-01-09 DIAGNOSIS — R519 Headache, unspecified: Secondary | ICD-10-CM | POA: Diagnosis not present

## 2020-01-09 DIAGNOSIS — R05 Cough: Secondary | ICD-10-CM | POA: Diagnosis not present

## 2020-01-25 ENCOUNTER — Other Ambulatory Visit: Payer: Self-pay

## 2020-01-25 ENCOUNTER — Inpatient Hospital Stay: Payer: Medicare HMO | Attending: Internal Medicine

## 2020-01-25 ENCOUNTER — Ambulatory Visit (HOSPITAL_COMMUNITY)
Admission: RE | Admit: 2020-01-25 | Discharge: 2020-01-25 | Disposition: A | Discharge status: Home / Self Care | Payer: Medicare HMO | Source: Ambulatory Visit | Attending: Internal Medicine | Admitting: Internal Medicine

## 2020-01-25 DIAGNOSIS — E079 Disorder of thyroid, unspecified: Secondary | ICD-10-CM | POA: Diagnosis not present

## 2020-01-25 DIAGNOSIS — Z791 Long term (current) use of non-steroidal anti-inflammatories (NSAID): Secondary | ICD-10-CM | POA: Insufficient documentation

## 2020-01-25 DIAGNOSIS — I10 Essential (primary) hypertension: Secondary | ICD-10-CM | POA: Diagnosis not present

## 2020-01-25 DIAGNOSIS — Z923 Personal history of irradiation: Secondary | ICD-10-CM | POA: Insufficient documentation

## 2020-01-25 DIAGNOSIS — Z9221 Personal history of antineoplastic chemotherapy: Secondary | ICD-10-CM | POA: Insufficient documentation

## 2020-01-25 DIAGNOSIS — J449 Chronic obstructive pulmonary disease, unspecified: Secondary | ICD-10-CM | POA: Insufficient documentation

## 2020-01-25 DIAGNOSIS — Z79899 Other long term (current) drug therapy: Secondary | ICD-10-CM | POA: Diagnosis not present

## 2020-01-25 DIAGNOSIS — Z902 Acquired absence of lung [part of]: Secondary | ICD-10-CM | POA: Diagnosis not present

## 2020-01-25 DIAGNOSIS — G4730 Sleep apnea, unspecified: Secondary | ICD-10-CM | POA: Diagnosis not present

## 2020-01-25 DIAGNOSIS — Z7982 Long term (current) use of aspirin: Secondary | ICD-10-CM | POA: Diagnosis not present

## 2020-01-25 DIAGNOSIS — F329 Major depressive disorder, single episode, unspecified: Secondary | ICD-10-CM | POA: Diagnosis not present

## 2020-01-25 DIAGNOSIS — C3411 Malignant neoplasm of upper lobe, right bronchus or lung: Secondary | ICD-10-CM | POA: Insufficient documentation

## 2020-01-25 DIAGNOSIS — C3490 Malignant neoplasm of unspecified part of unspecified bronchus or lung: Secondary | ICD-10-CM | POA: Insufficient documentation

## 2020-01-25 DIAGNOSIS — F419 Anxiety disorder, unspecified: Secondary | ICD-10-CM | POA: Diagnosis not present

## 2020-01-25 LAB — CMP (CANCER CENTER ONLY)
ALT: 12 U/L (ref 0–44)
AST: 11 U/L — ABNORMAL LOW (ref 15–41)
Albumin: 3.9 g/dL (ref 3.5–5.0)
Alkaline Phosphatase: 133 U/L — ABNORMAL HIGH (ref 38–126)
Anion gap: 6 (ref 5–15)
BUN: 21 mg/dL (ref 8–23)
CO2: 27 mmol/L (ref 22–32)
Calcium: 9.2 mg/dL (ref 8.9–10.3)
Chloride: 106 mmol/L (ref 98–111)
Creatinine: 0.77 mg/dL (ref 0.44–1.00)
GFR, Est AFR Am: >60 mL/min (ref >60)
GFR, Estimated: >60 mL/min (ref >60)
Glucose, Bld: 101 mg/dL — ABNORMAL HIGH (ref 70–99)
Potassium: 4.3 mmol/L (ref 3.5–5.1)
Sodium: 139 mmol/L (ref 135–145)
Total Bilirubin: 0.3 mg/dL (ref 0.3–1.2)
Total Protein: 7 g/dL (ref 6.5–8.1)

## 2020-01-25 LAB — CBC WITH DIFFERENTIAL (CANCER CENTER ONLY)
Abs Immature Granulocytes: 0.03 10*3/uL (ref 0.00–0.07)
Basophils Absolute: 0 10*3/uL (ref 0.0–0.1)
Basophils Relative: 1 %
Eosinophils Absolute: 0.1 10*3/uL (ref 0.0–0.5)
Eosinophils Relative: 1 %
HCT: 39.2 % (ref 36.0–46.0)
Hemoglobin: 11.7 g/dL — ABNORMAL LOW (ref 12.0–15.0)
Immature Granulocytes: 1 %
Lymphocytes Relative: 27 %
Lymphs Abs: 1.7 10*3/uL (ref 0.7–4.0)
MCH: 24.6 pg — ABNORMAL LOW (ref 26.0–34.0)
MCHC: 29.8 g/dL — ABNORMAL LOW (ref 30.0–36.0)
MCV: 82.4 fL (ref 80.0–100.0)
Monocytes Absolute: 0.5 10*3/uL (ref 0.1–1.0)
Monocytes Relative: 8 %
Neutro Abs: 3.9 10*3/uL (ref 1.7–7.7)
Neutrophils Relative %: 62 %
Platelet Count: 302 10*3/uL (ref 150–400)
RBC: 4.76 MIL/uL (ref 3.87–5.11)
RDW: 15.4 % (ref 11.5–15.5)
WBC Count: 6.2 10*3/uL (ref 4.0–10.5)
nRBC: 0 % (ref 0.0–0.2)

## 2020-01-25 MED ORDER — SODIUM CHLORIDE (PF) 0.9 % IJ SOLN
INTRAMUSCULAR | Status: AC
  Filled 2020-01-25: qty 50

## 2020-01-25 MED ORDER — IOHEXOL 300 MG/ML  SOLN
75.0000 mL | Freq: Once | INTRAMUSCULAR | Status: AC | PRN
  Administered 2020-01-25: 75 mL via INTRAVENOUS

## 2020-01-28 ENCOUNTER — Inpatient Hospital Stay: Payer: Medicare HMO | Admitting: Internal Medicine

## 2020-01-28 ENCOUNTER — Other Ambulatory Visit: Payer: Self-pay

## 2020-01-28 ENCOUNTER — Encounter: Payer: Self-pay | Admitting: Internal Medicine

## 2020-01-28 DIAGNOSIS — G4730 Sleep apnea, unspecified: Secondary | ICD-10-CM | POA: Diagnosis not present

## 2020-01-28 DIAGNOSIS — C3490 Malignant neoplasm of unspecified part of unspecified bronchus or lung: Secondary | ICD-10-CM

## 2020-01-28 DIAGNOSIS — C3411 Malignant neoplasm of upper lobe, right bronchus or lung: Secondary | ICD-10-CM | POA: Diagnosis not present

## 2020-01-28 DIAGNOSIS — I10 Essential (primary) hypertension: Secondary | ICD-10-CM | POA: Diagnosis not present

## 2020-01-28 DIAGNOSIS — Z9221 Personal history of antineoplastic chemotherapy: Secondary | ICD-10-CM | POA: Diagnosis not present

## 2020-01-28 DIAGNOSIS — J449 Chronic obstructive pulmonary disease, unspecified: Secondary | ICD-10-CM | POA: Diagnosis not present

## 2020-01-28 DIAGNOSIS — E079 Disorder of thyroid, unspecified: Secondary | ICD-10-CM | POA: Diagnosis not present

## 2020-01-28 DIAGNOSIS — F329 Major depressive disorder, single episode, unspecified: Secondary | ICD-10-CM | POA: Diagnosis not present

## 2020-01-28 DIAGNOSIS — Z923 Personal history of irradiation: Secondary | ICD-10-CM | POA: Diagnosis not present

## 2020-01-28 DIAGNOSIS — F419 Anxiety disorder, unspecified: Secondary | ICD-10-CM | POA: Diagnosis not present

## 2020-01-28 NOTE — Progress Notes (Signed)
[place] Telephone:([phone]) [phone]   Fax:(336) [phone]  OFFICE PROGRESS NOTE  Lillard Anes, MD [address] [place] [address]  DIAGNOSIS: stage IB (T2a, N0, M0) non-small cell lung cancer, adenocarcinoma diagnosed in July 2016  PRIOR THERAPY: 1) status post right upper lobectomy with lymph node dissection on 07/22/2015 in [place]. 2) adjuvant systemic chemotherapy with cisplatin and Alimta status post 4 cycles completed in November 2016.  CURRENT THERAPY: Observation.  INTERVAL HISTORY: Victoria Pena 69 y.o. female returns to the clinic today for annual follow-up visit.  The patient is feeling fine today with no concerning complaints.  She denied having any current chest pain, shortness of breath, cough or hemoptysis.  She has no nausea, vomiting, diarrhea or constipation.  She has no headache or visual changes.  She gained several pounds since her last visit.  The patient had repeat CT scan of the chest performed recently and she is here for evaluation and discussion of her risk her results.  MEDICAL HISTORY: Past Medical History:  Diagnosis Date  . Adenocarcinoma of right lung, stage 1 ([place]) 07/26/2016  . Anxiety   . Arthritis   . COPD (chronic obstructive pulmonary disease) ([place])    pt denies  . Depression   . GERD (gastroesophageal reflux disease)   . Hypertension   . Sleep apnea    "very very mild" no CPAP  . Thyroid disease     ALLERGIES:  is allergic to dextromethorphan hbr; keflex [cephalexin]; robaxin [methocarbamol]; bactrim [sulfamethoxazole-trimethoprim]; hydrocodone; hydroxyzine; prednisone & diphenhydramine; ciprofloxacin; dextromethorphan polistirex er; meloxicam; nitrofurantoin; diphenhydramine hcl; moxifloxacin; percocet [oxycodone-acetaminophen]; and prednisone.  MEDICATIONS:  Current Outpatient Medications  Medication Sig Dispense Refill  . albuterol (PROVENTIL HFA;VENTOLIN HFA) 108 (90 Base)  MCG/ACT inhaler Inhale 2 puffs into the lungs every 4 (four) hours as needed for wheezing or shortness of breath.     . ALPRAZolam (XANAX) 0.5 MG tablet Take 0.25 mg by mouth 2 (two) times daily.     Marland Kitchen aspirin 81 MG tablet Take 81 mg by mouth daily.    Marland Kitchen buPROPion (WELLBUTRIN XL) 150 MG 24 hr tablet Take 150 mg by mouth daily.    . calcium carbonate (TUMS - DOSED IN MG ELEMENTAL CALCIUM) 500 MG chewable tablet Chew 1 tablet by mouth 2 (two) times daily.    . cholecalciferol (VITAMIN D) 25 MCG (1000 UT) tablet Take 1 tablet by mouth daily.    Marland Kitchen erythromycin ophthalmic ointment APPLY TO INCISIONS QID FOR WEEKS THEN DISCONTINUE    . ibuprofen (ADVIL,MOTRIN) 200 MG tablet Take 600 mg by mouth 2 (two) times daily as needed for mild pain.    Marland Kitchen levothyroxine (SYNTHROID, LEVOTHROID) 125 MCG tablet Take 125 mcg by mouth daily before breakfast.    . losartan (COZAAR) 50 MG tablet Take 50 mg by mouth daily.  0  . metroNIDAZOLE (METROGEL) 0.75 % gel APPLY THIN COAT TO AFFECTED AREA TWICE A DAY as needed for rosacea  5  . omeprazole (PRILOSEC) 40 MG capsule     . Spacer/Aero-Holding Chambers (AEROCHAMBER W/FLOWSIGNAL) inhaler Chamber use with Ventolin    . traMADol (ULTRAM) 50 MG tablet      No current facility-administered medications for this visit.    SURGICAL HISTORY:  Past Surgical History:  Procedure Laterality Date  . ANKLE SURGERY     bilateral  . APPENDECTOMY    . CHOLECYSTECTOMY    . REPLACEMENT TOTAL KNEE Left   .  THYROIDECTOMY      REVIEW OF SYSTEMS:  A comprehensive review of systems was negative except for: Respiratory: positive for dyspnea on exertion   PHYSICAL EXAMINATION: General appearance: alert, cooperative and no distress Head: Normocephalic, without obvious abnormality, atraumatic Neck: no adenopathy, no JVD, supple, symmetrical, trachea midline and thyroid not enlarged, symmetric, no tenderness/mass/nodules Lymph nodes: Cervical, supraclavicular, and axillary nodes  normal. Resp: clear to auscultation bilaterally Back: symmetric, no curvature. ROM normal. No CVA tenderness. Cardio: regular rate and rhythm, S1, S2 normal, no murmur, click, rub or gallop GI: soft, non-tender; bowel sounds normal; no masses,  no organomegaly Extremities: extremities normal, atraumatic, no cyanosis or edema  ECOG PERFORMANCE STATUS: 1 - Symptomatic but completely ambulatory  Blood pressure 137/90, pulse 81, temperature 98.3 F (36.8 C), temperature source Oral, resp. rate 18, weight 171 lb 0.3 oz (77.6 kg), SpO2 100 %.  LABORATORY DATA: Lab Results  Component Value Date   WBC 6.2 01/25/2020   HGB 11.7 (L) 01/25/2020   HCT 39.2 01/25/2020   MCV 82.4 01/25/2020   PLT 302 01/25/2020      Chemistry      Component Value Date/Time   NA 139 01/25/2020 1345   NA 140 11/22/2017 1451   K 4.3 01/25/2020 1345   K 3.9 11/22/2017 1451   CL 106 01/25/2020 1345   CO2 27 01/25/2020 1345   CO2 26 11/22/2017 1451   BUN 21 01/25/2020 1345   BUN 14.5 11/22/2017 1451   CREATININE 0.77 01/25/2020 1345   CREATININE 0.8 11/22/2017 1451      Component Value Date/Time   CALCIUM 9.2 01/25/2020 1345   CALCIUM 9.1 11/22/2017 1451   ALKPHOS 133 (H) 01/25/2020 1345   ALKPHOS 122 11/22/2017 1451   AST 11 (L) 01/25/2020 1345   AST 13 11/22/2017 1451   ALT 12 01/25/2020 1345   ALT 13 11/22/2017 1451   BILITOT 0.3 01/25/2020 1345   BILITOT 0.48 11/22/2017 1451       RADIOGRAPHIC STUDIES: CT Chest W Contrast  Result Date: 01/25/2020 CLINICAL DATA:  Stage I B right upper lobe lung adenocarcinoma status post right upper lobectomy 07/22/2015 with adjuvant chemotherapy completed November 2016. Interval observation. Restaging. Patient reports worsening dyspnea. EXAM: CT CHEST WITH CONTRAST TECHNIQUE: Multidetector CT imaging of the chest was performed during intravenous contrast administration. CONTRAST:  34mL OMNIPAQUE IOHEXOL 300 MG/ML  SOLN COMPARISON:  01/24/2019 chest CT. FINDINGS:  Cardiovascular: Normal heart size. No significant pericardial effusion/thickening. Simple 4.0 x 1.9 cm right posterior mediastinal cystic lesion is stable, favor pericardial cyst. Three-vessel coronary atherosclerosis. Normal course and caliber of great vessels. No central pulmonary emboli. Mediastinum/Nodes: Total thyroidectomy. Unremarkable esophagus. Newly enlarged 1.0 cm short axis diameter left axillary lymph node (series 2/image 51). No additional pathologically enlarged axillary lymph nodes on either side. No pathologically enlarged mediastinal or hilar lymph nodes. Lungs/Pleura: Status post right upper lobectomy. No pneumothorax. No pleural effusion. No acute consolidative airspace disease, lung masses or significant pulmonary nodules. Mild-to-moderate centrilobular and paraseptal emphysema. Upper abdomen: Exophytic simple 0.6 cm posterior upper left renal cyst. Musculoskeletal: No aggressive appearing focal osseous lesions. Moderate thoracic spondylosis. IMPRESSION: 1. Newly enlarged left axillary lymph node, cannot exclude nodal metastatic disease. Management options include PET-CT, ultrasound-guided biopsy and/or diagnostic mammographic evaluation. 2. No additional potential findings of metastatic disease in the chest. 3. No evidence of local tumor recurrence status post right upper lobectomy. 4. Aortic Atherosclerosis (ICD10-I70.0) and Emphysema (ICD10-J43.9). Electronically Signed   By: Janina Mayo.D.  On: 01/25/2020 17:23    ASSESSMENT AND PLAN:  This is a very pleasant 69 years old white female with a stage IB non-small cell lung cancer, adenocarcinoma status post right upper lobectomy with lymph node dissection followed by 4 cycles of adjuvant systemic chemotherapy with cisplatin and Alimta. The patient has been in observation for the last several years and she is feeling fine except for shortness of breath with exertion. She had repeat CT scan of the chest performed recently.  I personally  and independently reviewed the scan images and discussed the results with the patient today. Her scan showed no concerning findings for disease recurrence in the lung but there was new enlarged left axillary lymph node and underlying malignancy could not be excluded. I recommended for the patient to proceed with ultrasound-guided core biopsy of the left axillary lymph node for tissue diagnosis and confirmation. If the biopsy is negative for malignancy, we will see the patient back for follow-up visit in 1 year for evaluation with repeat CT scan of the chest for restaging of her disease. She will continue to have her mammogram as previously planned.  Her last mammogram was 6 months ago and it was unremarkable. The patient was advised to call immediately if she has any concerning symptoms in the interval. The patient voices understanding of current disease status and treatment options and is in agreement with the current care plan. All questions were answered. The patient knows to call the clinic with any problems, questions or concerns. We can certainly see the patient much sooner if necessary.  Disclaimer: This note was dictated with voice recognition software. Similar sounding words can inadvertently be transcribed and may not be corrected upon review.

## 2020-01-29 ENCOUNTER — Telehealth: Payer: Self-pay | Admitting: Internal Medicine

## 2020-01-29 NOTE — Telephone Encounter (Signed)
Scheduled per los. Called and left msg. mailed printout  

## 2020-02-02 ENCOUNTER — Other Ambulatory Visit: Payer: Self-pay | Admitting: Legal Medicine

## 2020-02-04 ENCOUNTER — Other Ambulatory Visit: Payer: Self-pay | Admitting: Internal Medicine

## 2020-02-04 DIAGNOSIS — Z853 Personal history of malignant neoplasm of breast: Secondary | ICD-10-CM

## 2020-02-12 ENCOUNTER — Other Ambulatory Visit: Payer: Self-pay | Admitting: Internal Medicine

## 2020-02-12 DIAGNOSIS — C3490 Malignant neoplasm of unspecified part of unspecified bronchus or lung: Secondary | ICD-10-CM

## 2020-02-13 DIAGNOSIS — Z20828 Contact with and (suspected) exposure to other viral communicable diseases: Secondary | ICD-10-CM | POA: Diagnosis not present

## 2020-02-25 ENCOUNTER — Ambulatory Visit
Admission: RE | Admit: 2020-02-25 | Discharge: 2020-02-25 | Disposition: A | Discharge status: Home / Self Care | Payer: Medicare HMO | Source: Ambulatory Visit | Attending: Internal Medicine | Admitting: Internal Medicine

## 2020-02-25 ENCOUNTER — Other Ambulatory Visit: Payer: Self-pay

## 2020-02-25 DIAGNOSIS — N6489 Other specified disorders of breast: Secondary | ICD-10-CM | POA: Diagnosis not present

## 2020-02-25 DIAGNOSIS — Z853 Personal history of malignant neoplasm of breast: Secondary | ICD-10-CM

## 2020-02-25 DIAGNOSIS — R922 Inconclusive mammogram: Secondary | ICD-10-CM | POA: Diagnosis not present

## 2020-02-25 DIAGNOSIS — C3490 Malignant neoplasm of unspecified part of unspecified bronchus or lung: Secondary | ICD-10-CM

## 2020-02-25 DIAGNOSIS — R590 Localized enlarged lymph nodes: Secondary | ICD-10-CM | POA: Diagnosis not present

## 2020-02-25 HISTORY — PX: BREAST BIOPSY: SHX20

## 2020-03-11 DIAGNOSIS — H5213 Myopia, bilateral: Secondary | ICD-10-CM | POA: Diagnosis not present

## 2020-03-11 DIAGNOSIS — H25813 Combined forms of age-related cataract, bilateral: Secondary | ICD-10-CM | POA: Diagnosis not present

## 2020-03-12 DIAGNOSIS — H5213 Myopia, bilateral: Secondary | ICD-10-CM | POA: Diagnosis not present

## 2020-03-12 DIAGNOSIS — H52209 Unspecified astigmatism, unspecified eye: Secondary | ICD-10-CM | POA: Diagnosis not present

## 2020-03-12 DIAGNOSIS — H524 Presbyopia: Secondary | ICD-10-CM | POA: Diagnosis not present

## 2020-03-14 ENCOUNTER — Telehealth: Payer: Self-pay | Admitting: Legal Medicine

## 2020-03-14 NOTE — Progress Notes (Signed)
  Chronic Care Management   Note  03/14/2020 Name: Naijah Lacek MRN: [id number] DOB: 06/28/1951  Cinthya Bors is a 69 y.o. year old female who is a primary care patient of Lillard Anes, MD. I reached out to [name] by phone today in response to a referral sent by Ms. Roe Rutherford Mayhan's PCP, Lillard Anes, MD.   Ms. Smart was given information about Chronic Care Management services today including:  1. CCM service includes personalized support from designated clinical staff supervised by her physician, including individualized plan of care and coordination with other care providers 2. 24/7 contact phone numbers for assistance for urgent and routine care needs. 3. Service will only be billed when office clinical staff spend 20 minutes or more in a month to coordinate care. 4. Only one practitioner may furnish and bill the service in a calendar month. 5. The patient may stop CCM services at any time (effective at the end of the month) by phone call to the office staff.   Patient agreed to services and verbal consent obtained.   Follow up plan:   Earney [place] Upstream Scheduler

## 2020-03-17 ENCOUNTER — Other Ambulatory Visit: Payer: Self-pay | Admitting: Legal Medicine

## 2020-03-18 ENCOUNTER — Other Ambulatory Visit: Payer: Self-pay | Admitting: Legal Medicine

## 2020-03-18 DIAGNOSIS — C3491 Malignant neoplasm of unspecified part of right bronchus or lung: Secondary | ICD-10-CM

## 2020-03-18 NOTE — Chronic Care Management (AMB) (Signed)
Chronic Care Management Pharmacy  Name: Victoria Pena  MRN: [id number] DOB: Apr 22, 1951  Chief Complaint/ HPI  Victoria Pena,  69 y.o. , female presents for their Initial CCM visit with the clinical pharmacist via telephone due to COVID-19 Pandemic.  PCP : Lillard Anes, MD  Their chronic conditions include: HTN, Graves Disease, Anxiety, Depression, Palpitations, Adenocarcinoma of right lung, cough, GERD, HLD. .   Office Visits:  01/09/2020 - Exposure to COVID with negative test. No medications.  10/31/2019 - Sinusitis with negative COVID test - azithromycin.  04/24/2019 - Last labs drawn. No anemia noted. Normal kidney and liver function. TSH normal.   Consult Visit:  01/28/2020 - Oncology visit. MD recommended a lymph node biopsy to rule out malignancy.  09/19/2019- seen by Podiatry for tendonitis. Recommended ice, rest, aspercream, tylenol and tramadol.  Medications: Outpatient Encounter Medications as of 03/19/2020  Medication Sig  . albuterol (PROVENTIL HFA;VENTOLIN HFA) 108 (90 Base) MCG/ACT inhaler Inhale 2 puffs into the lungs every 4 (four) hours as needed for wheezing or shortness of breath.   . ALPRAZolam (XANAX) 0.5 MG tablet Take 0.5 mg by mouth at bedtime.   Marland Kitchen aspirin 81 MG tablet Take 81 mg by mouth daily.  Marland Kitchen buPROPion (WELLBUTRIN XL) 150 MG 24 hr tablet TAKE 1 TABLET BY MOUTH EVERY DAY  . calcium carbonate (TUMS - DOSED IN MG ELEMENTAL CALCIUM) 500 MG chewable tablet Chew 1 tablet by mouth 2 (two) times daily as needed.   Marland Kitchen ibuprofen (ADVIL,MOTRIN) 200 MG tablet Take 600 mg by mouth 2 (two) times daily as needed for mild pain.  Marland Kitchen levothyroxine (SYNTHROID, LEVOTHROID) 125 MCG tablet Take 125 mcg by mouth daily before breakfast.  . losartan (COZAAR) 50 MG tablet Take 50 mg by mouth daily.  Marland Kitchen omeprazole (PRILOSEC) 40 MG capsule   . traMADol (ULTRAM) 50 MG tablet TAKE 1 TABLET(50 MG) BY MOUTH EVERY 4 HOURS AS NEEDED  . [DISCONTINUED] metroNIDAZOLE  (METROGEL) 0.75 % gel APPLY THIN COAT TO AFFECTED AREA TWICE A DAY as needed for rosacea  . erythromycin ophthalmic ointment APPLY TO INCISIONS QID FOR WEEKS THEN DISCONTINUE  . Spacer/Aero-Holding Chambers (AEROCHAMBER W/FLOWSIGNAL) inhaler Chamber use with Ventolin   No facility-administered encounter medications on file as of 03/19/2020.   SDOH Screenings   Alcohol Screen:   . Last Alcohol Screening Score (AUDIT):   Depression (PHQ2-9): Medium Risk  . PHQ-2 Score: 18  Financial Resource Strain:   . Difficulty of Paying Living Expenses:   Food Insecurity:   . Worried About Charity fundraiser in the Last Year:   . [place] in the Last Year:   Housing:   . Last Housing Risk Score:   Physical Activity:   . Days of Exercise per Week:   . Minutes of Exercise per Session:   Social Connections:   . Frequency of Communication with Friends and Family:   . Frequency of Social Gatherings with Friends and Family:   . Attends Religious Services:   . Active Member of Clubs or Organizations:   . Attends Archivist Meetings:   Marland Kitchen Marital Status:   Stress: Stress Concern Present  . Feeling of Stress : Very much  Tobacco Use: Medium Risk  . Smoking Tobacco Use: Former Smoker  . Smokeless Tobacco Use: Never Used  Transportation Needs: No Transportation Needs  . Lack of Transportation (Medical): No  . Lack of Transportation (Non-Medical): No    Current Diagnosis/Assessment:  Goals Addressed  This Visit's Progress   . Pharmacy Care Plan       CARE PLAN ENTRY  Current Barriers:  . Chronic Disease Management support, education, and care coordination needs related to HTN, Pain, Depression, Anxiety, Grave's disease.   Pharmacist Clinical Goal(s):  . Ensure safety, efficacy, and affordability of medications . Recommend maintaining BP <140/90 mmHg   Interventions: . Comprehensive medication review performed. Marland Kitchen Refill of topical metronidazole sent in to  [facility].   Patient Self Care Activities:  . Patient verbalizes understanding of plan to schedule appointments with Dr. Henrene Pastor and podiatry  . Recommend resuming exercise regimen as tolerated once pain is addressed.  . Recommend following the Dash diet guidance of incorporating fruits, vegetables, whole-grains, low-fat dairy products, lean meats, legumes and nuts for optimal health. . Recommend keeping sodium intake less than 1500 mg/day.   Initial goal documentation        Hypertension   Office blood pressures are  BP Readings from Last 3 Encounters:  01/28/20 137/90  01/25/19 130/83  05/13/18 103/63    Patient has failed these meds in the past: non listed Patient is currently controlled on the following medications: Losartan 50 mg  Patient checks BP at home when feeling symptomatic  Patient home BP readings are ranging: 90/60-150/80  We discussed diet and exercise extensively. Patient is aware of signs/symptoms of low bp and checks if needed.   Plan  Continue current medications     and  Graves Disease:    Patient has failed these meds in past: n/a Patient is currently controlled on the following medications: levothyroxine 175 mcg  We discussed:  Patient is followed by opthamology due to limitations with Grave disease. She reports that she has limited eye mobility but has learned to compensate. Patient currently experiencing increased anxiety and depression.   Plan  Continue current medications. Recommend repeat blood work and visit with Dr. Henrene Pastor in the near future.     Lung Cancer/Cough:    Patient has failed these meds in past: previous chemotherapy for lung cancer and lobe removed Patient is currently controlled on the following medications: albuterol  We discussed:  diet and exercise extensively. Patient enjoys exercise but has been limited lately due to back/knee/hip/foot pain. She hopes to get addressed and begin exercising again. Patient reports a  healthy diet.   Plan  Continue current medications   Pain/Arthritis:   Patient has failed these meds in past: tylenol, meloxicam, oxycodone, methocarbamol Patient is currently uncontrolled on the following medications: tramadol, ibuprofen  We discussed:  Patient is experiencing a lot of pain in her feet/back/hip/knees. She is following up with podiatry for inserts for her shoes. She also plans to see Dr.Perry shortly to address pain. Pain is unrelieved by rest, ice, aspercream and pain medications.   Plan  Continue current medications and schedule visit with PCP/podiatry.    GERD   Patient has failed these meds in past: protonix Patient is currently controlled on the following medications: omeprazole, Tums  We discussed:  diet and exercise extensively. Patient reports good control and rarely using Tums. Patient avoids trigger foods.   Plan  Continue current medications   Anxiety/Depression   Patient has failed these meds in past: effexor, lexapro Patient is currently uncontrolled on the following medications: alprazolam 0.5 mg, bupropion xl 150 mg,   We discussed: Patient feels depression and increased anxiety are coming directly from the pain limiting her ability to complete normal activities. She is an active person who enjoys exercise,  working in her yard and [employer]. Currently the pain is limiting much of that.   Plan  Continue current medications until visit with Dr. Henrene Pastor soon.    Health Maintenance   Patient is currently controlled on the following medications:    Metronidazole topically - rosacea  We discussed:  diet and exercise extensively. Her rosacea is flaring up likely due to anxiety. A new prescription was sent in to [facility] for metronidazole.   Plan  Continue current medications   Medication Management   Pt uses [place] for all medications Weekly pill box Pt endorses great compliance  We discussed: Good compliance with  medication.   Plan to see Dr. Henrene Pastor and Dr. Cannon Kettle soon for visits to address pain, worsened depression and anxiety.    Follow up: 3 months

## 2020-03-19 ENCOUNTER — Other Ambulatory Visit: Payer: Self-pay

## 2020-03-19 ENCOUNTER — Ambulatory Visit: Payer: Medicare HMO

## 2020-03-19 DIAGNOSIS — E0500 Thyrotoxicosis with diffuse goiter without thyrotoxic crisis or storm: Secondary | ICD-10-CM

## 2020-03-19 DIAGNOSIS — I10 Essential (primary) hypertension: Secondary | ICD-10-CM

## 2020-03-19 DIAGNOSIS — F419 Anxiety disorder, unspecified: Secondary | ICD-10-CM

## 2020-03-19 MED ORDER — METRONIDAZOLE 0.75 % EX GEL: Freq: Two times a day (BID) | CUTANEOUS | 2 refills | Status: DC

## 2020-03-19 NOTE — Patient Instructions (Signed)
Exercises To Do While Sitting  Exercises that you do while sitting (chair exercises) can give you many of the same benefits as full exercise. Benefits include strengthening your heart, burning calories, and keeping muscles and joints healthy. Exercise can also improve your mood and help with depression and anxiety. You may benefit from chair exercises if you are unable to do standing exercises because of:  Diabetic foot pain.  Obesity.  Illness.  Arthritis.  Recovery from surgery or injury.  Breathing problems.  Balance problems.  Another type of disability. Before starting chair exercises, check with your health care provider or a physical therapist to find out how much exercise you can tolerate and which exercises are safe for you. If your health care provider approves:  Start out slowly and build up over time. Aim to work up to about 10-20 minutes for each exercise session.  Make exercise part of your daily routine.  Drink water when you exercise. Do not wait until you are thirsty. Drink every 10-15 minutes.  Stop exercising right away if you have pain, nausea, shortness of breath, or dizziness.  If you are exercising in a wheelchair, make sure to lock the wheels.  Ask your health care provider whether you can do tai chi or yoga. Many positions in these mind-body exercises can be modified to do while seated. Warm-up Before starting other exercises: 1. Sit up as straight as you can. Have your knees bent at 90 degrees, which is the shape of the capital letter "L." Keep your feet flat on the floor. 2. Sit at the front edge of your chair, if you can. 3. Pull in (tighten) the muscles in your abdomen and stretch your spine and neck as straight as you can. Hold this position for a few minutes. 4. Breathe in and out evenly. Try to concentrate on your breathing, and relax your mind. Stretching Exercise A: Arm stretch 1. Hold your arms out straight in front of your body. 2. Bend  your hands at the wrist with your fingers pointing up, as if signaling someone to stop. Notice the slight tension in your forearms as you hold the position. 3. Keeping your arms out and your hands bent, rotate your hands outward as far as you can and hold this stretch. Aim to have your thumbs pointing up and your pinkie fingers pointing down. Slowly repeat arm stretches for one minute as tolerated. Exercise B: Leg stretch 1. If you can move your legs, try to "draw" letters on the floor with the toes of your foot. Write your name with one foot. 2. Write your name with the toes of your other foot. Slowly repeat the movements for one minute as tolerated. Exercise C: Reach for the sky 1. Reach your hands as far over your head as you can to stretch your spine. 2. Move your hands and arms as if you are climbing a rope. Slowly repeat the movements for one minute as tolerated. Range of motion exercises Exercise A: Shoulder roll 1. Let your arms hang loosely at your sides. 2. Lift just your shoulders up toward your ears, then let them relax back down. 3. When your shoulders feel loose, rotate your shoulders in backward and forward circles. Do shoulder rolls slowly for one minute as tolerated. Exercise B: March in place 1. As if you are marching, pump your arms and lift your legs up and down. Lift your knees as high as you can. ? If you are unable to lift your knees,  just pump your arms and move your ankles and feet up and down. March in place for one minute as tolerated. Exercise C: Seated jumping jacks 1. Let your arms hang down straight. 2. Keeping your arms straight, lift them up over your head. Aim to point your fingers to the ceiling. 3. While you lift your arms, straighten your legs and slide your heels along the floor to your sides, as wide as you can. 4. As you bring your arms back down to your sides, slide your legs back together. ? If you are unable to use your legs, just move your  arms. Slowly repeat seated jumping jacks for one minute as tolerated. Strengthening exercises Exercise A: Shoulder squeeze 1. Hold your arms straight out from your body to your sides, with your elbows bent and your fists pointed at the ceiling. 2. Keeping your arms in the bent position, move them forward so your elbows and forearms meet in front of your face. 3. Open your arms back out as wide as you can with your elbows still bent, until you feel your shoulder blades squeezing together. Hold for 5 seconds. Slowly repeat the movements forward and backward for one minute as tolerated. Contact a health care provider if you:  Had to stop exercising due to any of the following: ? Pain. ? Nausea. ? Shortness of breath. ? Dizziness. ? Fatigue.  Have significant pain or soreness after exercising. Get help right away if you have:  Chest pain.  Difficulty breathing. These symptoms may represent a serious problem that is an emergency. Do not wait to see if the symptoms will go away. Get medical help right away. Call your local emergency services (911 in the [place] Do not drive yourself to the hospital. This information is not intended to replace advice given to you by your health care provider. Make sure you discuss any questions you have with your health care provider. Document Revised: 03/29/2019 Document Reviewed: 10/19/2017 Elsevier Patient Education  2020 [employer].

## 2020-03-24 ENCOUNTER — Encounter: Payer: Self-pay | Admitting: Legal Medicine

## 2020-03-24 ENCOUNTER — Ambulatory Visit (INDEPENDENT_AMBULATORY_CARE_PROVIDER_SITE_OTHER): Payer: Medicare HMO | Admitting: Legal Medicine

## 2020-03-24 ENCOUNTER — Other Ambulatory Visit: Payer: Self-pay

## 2020-03-24 VITALS — BP 140/80 | HR 70 | Temp 98.4°F | Resp 16 | Ht 65.0 in | Wt 172.2 lb

## 2020-03-24 DIAGNOSIS — E0500 Thyrotoxicosis with diffuse goiter without thyrotoxic crisis or storm: Secondary | ICD-10-CM

## 2020-03-24 DIAGNOSIS — M797 Fibromyalgia: Secondary | ICD-10-CM

## 2020-03-24 DIAGNOSIS — F064 Anxiety disorder due to known physiological condition: Secondary | ICD-10-CM

## 2020-03-24 DIAGNOSIS — K219 Gastro-esophageal reflux disease without esophagitis: Secondary | ICD-10-CM

## 2020-03-24 DIAGNOSIS — I10 Essential (primary) hypertension: Secondary | ICD-10-CM

## 2020-03-24 DIAGNOSIS — F320 Major depressive disorder, single episode, mild: Secondary | ICD-10-CM

## 2020-03-24 HISTORY — DX: Major depressive disorder, single episode, mild: F32.0

## 2020-03-24 HISTORY — DX: Gastro-esophageal reflux disease without esophagitis: K21.9

## 2020-03-24 HISTORY — DX: Fibromyalgia: M79.7

## 2020-03-24 MED ORDER — ALPRAZOLAM 0.5 MG PO TABS: 0.5000 mg | ORAL_TABLET | Freq: Every day | ORAL | 3 refills | Status: DC

## 2020-03-24 NOTE — Assessment & Plan Note (Signed)
Patient is having chronic pain 8 to 10 /10 every day.  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  She has chronic foot pain due to repeated surgeries.  I increased tramadol to qid.  She will need to get pain clinic.

## 2020-03-24 NOTE — Progress Notes (Signed)
Established Patient Office Visit  Subjective:  Patient ID: Victoria Pena, female    DOB: June 05, 1951  Age: 69 y.o. MRN: [id number]  CC:  Chief Complaint  Patient presents with  . Hypertension  . Hypothyroidism  . Hyperlipidemia    HPI Victoria Pena presents for Chronic visit.  Patient presents for follow up of hypertension.  Patient tolerating losartan well with side effects.  Patient was diagnosed with hypertension 2010 so has been treated for hypertension for 10 years.Patient is working on maintaining diet and exercise regimen and follows up as directed. Complication include none.  Patient has HYPOTHYROIDISM.  Diagnosed 10 years ago.  Patient has stable thyroid readings.  Patient is having stable.  Last TSH was 2.1.  continue dosage of thyroid medicine.  Patient presents with hyperlipidemia.  Compliance with treatment has been good; patient takes medicines as directed, maintains low cholesterol diet, follows up as directed, and maintains exercise regimen.  Patient is using none without problems.  Chronic pain.  She hurts all over 8-10/10.    Past Medical History:  Diagnosis Date  . Adenocarcinoma of right lung, stage 1 ([place]) 07/26/2016  . Anxiety   . Arthritis   . COPD (chronic obstructive pulmonary disease) ([place])    pt denies  . Depression   . GERD (gastroesophageal reflux disease)   . Hypertension   . Sleep apnea    "very very mild" no CPAP  . Thyroid disease     Past Surgical History:  Procedure Laterality Date  . ANKLE SURGERY     bilateral  . APPENDECTOMY    . CHOLECYSTECTOMY    . REPLACEMENT TOTAL KNEE Left   . THYROIDECTOMY      Family History  Problem Relation Age of Onset  . Cancer Mother   . Cancer Father   . Cancer Brother     Social History   Socioeconomic History  . Marital status: Widowed    Spouse name: Not on file  . Number of children: Not on file  . Years of education: Not on file  . Highest education level: Not on file   Occupational History  . Not on file  Tobacco Use  . Smoking status: Former Smoker    Packs/day: 1.50    Years: 35.00    Pack years: 52.50    Quit date: 07/27/1999    Years since quitting: 20.6  . Smokeless tobacco: Never Used  Substance and Sexual Activity  . Alcohol use: No  . Drug use: No  . Sexual activity: Not Currently  Other Topics Concern  . Not on file  Social History Narrative  . Not on file   Social Determinants of Health   Financial Resource Strain:   . Difficulty of Paying Living Expenses:   Food Insecurity:   . Worried About Charity fundraiser in the Last Year:   . Arboriculturist in the Last Year:   Transportation Needs: No Transportation Needs  . Lack of Transportation (Medical): No  . Lack of Transportation (Non-Medical): No  Physical Activity:   . Days of Exercise per Week:   . Minutes of Exercise per Session:   Stress: Stress Concern Present  . Feeling of Stress : Very much  Social Connections:   . Frequency of Communication with Friends and Family:   . Frequency of Social Gatherings with Friends and Family:   . Attends Religious Services:   . Active Member of Clubs or Organizations:   . Attends Club  or Organization Meetings:   Marland Kitchen Marital Status:   Intimate Partner Violence:   . Fear of Current or Ex-Partner:   . Emotionally Abused:   Marland Kitchen Physically Abused:   . Sexually Abused:     Outpatient Medications Prior to Visit  Medication Sig Dispense Refill  . albuterol (PROVENTIL HFA;VENTOLIN HFA) 108 (90 Base) MCG/ACT inhaler Inhale 2 puffs into the lungs every 4 (four) hours as needed for wheezing or shortness of breath.     Marland Kitchen aspirin 81 MG tablet Take 81 mg by mouth daily.    Marland Kitchen buPROPion (WELLBUTRIN XL) 150 MG 24 hr tablet TAKE 1 TABLET BY MOUTH EVERY DAY 90 tablet 2  . calcium carbonate (OS-CAL) 1250 (500 Ca) MG chewable tablet Chew by mouth.    . calcium carbonate (TUMS - DOSED IN MG ELEMENTAL CALCIUM) 500 MG chewable tablet Chew 1 tablet by mouth  2 (two) times daily as needed.     Marland Kitchen ibuprofen (ADVIL,MOTRIN) 200 MG tablet Take 600 mg by mouth 2 (two) times daily as needed for mild pain.    Marland Kitchen levothyroxine (SYNTHROID, LEVOTHROID) 125 MCG tablet Take 125 mcg by mouth daily before breakfast.    . losartan (COZAAR) 50 MG tablet Take 50 mg by mouth daily.  0  . omeprazole (PRILOSEC) 40 MG capsule     . Spacer/Aero-Holding Chambers (AEROCHAMBER W/FLOWSIGNAL) inhaler Chamber use with Ventolin    . traMADol (ULTRAM) 50 MG tablet TAKE 1 TABLET(50 MG) BY MOUTH EVERY 4 HOURS AS NEEDED 120 tablet 2  . ALPRAZolam (XANAX) 0.5 MG tablet Take 0.5 mg by mouth at bedtime.     Marland Kitchen erythromycin ophthalmic ointment APPLY TO INCISIONS QID FOR WEEKS THEN DISCONTINUE    . metroNIDAZOLE (METROGEL) 0.75 % gel Apply topically 2 (two) times daily. 45 g 2   No facility-administered medications prior to visit.    Allergies  Allergen Reactions  . Dextromethorphan Hbr Shortness Of Breath  . Keflex [Cephalexin] Shortness Of Breath    Hard time breathing  . Robaxin [Methocarbamol] Nausea Only    Stomach pain . Could not eat  . Bactrim [Sulfamethoxazole-Trimethoprim] Itching  . Hydroxyzine Other (See Comments) and Swelling    Numbness in lips  . Prednisone & Diphenhydramine Other (See Comments)    Face flushed, heart racing  . Ciprofloxacin   . Dextromethorphan Polistirex Er   . Meloxicam Other (See Comments)    Headache, body aches  . Nitrofurantoin   . Diphenhydramine Hcl Palpitations  . Moxifloxacin Rash  . Prednisone Other (See Comments) and Palpitations    "flushing"     ROS Review of Systems  Constitutional: Negative.   HENT: Negative.   Eyes: Negative.   Respiratory: Negative.   Cardiovascular: Negative.   Gastrointestinal: Negative.   Endocrine: Negative.   Genitourinary: Negative.   Musculoskeletal: Negative.   Neurological: Negative.       Objective:    Physical Exam  Constitutional: She is oriented to person, place, and time. She  appears well-developed and well-nourished.  HENT:  Head: Normocephalic and atraumatic.  Eyes: Pupils are equal, round, and reactive to light. EOM are normal.  Cardiovascular: Normal rate, regular rhythm and normal heart sounds.  Pulmonary/Chest: Effort normal and breath sounds normal.  Abdominal: Soft. Bowel sounds are normal.  Musculoskeletal:        General: Tenderness present. Normal range of motion.     Right shoulder: Tenderness present.     Left shoulder: Tenderness present.     Cervical back: Normal  range of motion and neck supple. Tenderness present.     Lumbar back: Tenderness present.     Right upper leg: Tenderness present.     Left upper leg: Tenderness present.     Right ankle: Tenderness present.     Left ankle: Tenderness present.     Comments: Diffuse tender points in axial skeleto, pain both ankles for surgery  Neurological: She is alert and oriented to person, place, and time. She has normal reflexes.  Skin: Skin is warm.  Psychiatric: She has a normal mood and affect.  Vitals reviewed.   BP 140/80 (BP Location: Right Arm, Patient Position: Sitting)   Pulse 70   Temp 98.4 F (36.9 C) (Temporal)   Resp 16   Ht _0  (1.651 m)   Wt 172 lb 3.2 oz (78.1 kg)   LMP  (LMP Unknown)   BMI 28.66 kg/m  Wt Readings from Last 3 Encounters:  03/24/20 172 lb 3.2 oz (78.1 kg)  01/28/20 171 lb 0.3 oz (77.6 kg)  01/25/19 166 lb (75.3 kg)     Health Maintenance Due  Topic Date Due  . Hepatitis C Screening  Never done  . TETANUS/TDAP  Never done  . MAMMOGRAM  Never done  . COLONOSCOPY  Never done  . PNA vac Low Risk Adult (1 of 2 - PCV13) Never done    There are no preventive care reminders to display for this patient.  No results found for: TSH Lab Results  Component Value Date   WBC 6.2 01/25/2020   HGB 11.7 (L) 01/25/2020   HCT 39.2 01/25/2020   MCV 82.4 01/25/2020   PLT 302 01/25/2020   Lab Results  Component Value Date   NA 139 01/25/2020   K 4.3  01/25/2020   CHLORIDE 107 11/22/2017   CO2 27 01/25/2020   GLUCOSE 101 (H) 01/25/2020   BUN 21 01/25/2020   CREATININE 0.77 01/25/2020   BILITOT 0.3 01/25/2020   ALKPHOS 133 (H) 01/25/2020   AST 11 (L) 01/25/2020   ALT 12 01/25/2020   PROT 7.0 01/25/2020   ALBUMIN 3.9 01/25/2020   CALCIUM 9.2 01/25/2020   ANIONGAP 6 01/25/2020   EGFR >60 11/22/2017   No results found for: CHOL No results found for: HDL No results found for: LDLCALC No results found for: TRIG No results found for: CHOLHDL No results found for: HGBA1C    Assessment & Plan:   Problem List Items Addressed This Visit      Cardiovascular and Mediastinum   Benign essential hypertension - Primary    An individual hypertension care plan was established and reinforced today.  The patient's status was assessed using clinical findings on exam and labs or diagnostic tests. The patient's success at meeting treatment goals on disease specific evidence-based guidelines and found to be well controlled. SELF MANAGEMENT: The patient and I together assessed ways to personally work towards obtaining the recommended goals. RECOMMENDATIONS: avoid decongestants found in common cold remedies, decrease consumption of alcohol, perform routine monitoring of BP with home BP cuff, exercise, reduction of dietary salt, take medicines as prescribed, try not to miss doses and quit smoking.  Regular exercise and maintaining a healthy weight is needed.  Stress reduction may help. A CLINICAL SUMMARY including written plan identify barriers to care unique to individual due to social or financial issues.  We attempt to mutually creat solutions for individual and family understanding.      Relevant Orders   CBC with Differential  Comprehensive metabolic panel     Digestive   Gastroesophageal reflux disease without esophagitis    Plan of care was formulated today.  She is doing well.  A plan of care was formulated using patient exam, tests and  other sources to optimize care using evidence based information.  Recommend no smoking, no eating after supper, avoid fatty foods, elevate Head of bed, avoid tight fitting clothing.  Continue on omeprazole.        Endocrine   Graves disease    Her graves disease has been stable on her medicines.      Relevant Orders   TSH   T4, Free     Other   Current mild episode of major depressive disorder ([place])    Patient's depression is uncontrolled with wellbutrin, we discused other medicines.   Anhedonia worse.  PHQ 9 was ws performed score 17. An individual care plan was established or reinforced today.  The patient's disease status was assessed using clinical findings on exam, labs, and or other diagnostic testing to determine patient's success in meeting treatment goals based on disease specific evidence-based guidelines and found to be worsening due to pain. Recommendations include stay on medicines.      Relevant Medications   ALPRAZolam (XANAX) 0.5 MG tablet   Fibromyalgia    Patient is having chronic pain 8 to 10 /10 every day.  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  She has chronic foot pain due to repeated surgeries.  I increased tramadol to qid.  She will need to get pain clinic.      Relevant Orders   Ambulatory referral to Pain Clinic    Other Visit Diagnoses    Anxiety disorder due to known physiological condition       Relevant Medications   ALPRAZolam (XANAX) 0.5 MG tablet      Meds ordered this encounter  Medications  . ALPRAZolam (XANAX) 0.5 MG tablet    Sig: Take 1 tablet (0.5 mg total) by mouth at bedtime.    Dispense:  120 tablet    Refill:  3    Follow-up: Return in about 4 months (around 07/24/2020) for fasting.    Reinaldo Meeker, MD

## 2020-03-24 NOTE — Assessment & Plan Note (Signed)
Patient's depression is uncontrolled with wellbutrin, we discused other medicines.   Anhedonia worse.  PHQ 9 was ws performed score 17. An individual care plan was established or reinforced today.  The patient's disease status was assessed using clinical findings on exam, labs, and or other diagnostic testing to determine patient's success in meeting treatment goals based on disease specific evidence-based guidelines and found to be worsening due to pain. Recommendations include stay on medicines.

## 2020-03-24 NOTE — Assessment & Plan Note (Signed)
Plan of care was formulated today.  She is doing well.  A plan of care was formulated using patient exam, tests and other sources to optimize care using evidence based information.  Recommend no smoking, no eating after supper, avoid fatty foods, elevate Head of bed, avoid tight fitting clothing.  Continue on omeprazole.

## 2020-03-24 NOTE — Assessment & Plan Note (Signed)

## 2020-03-24 NOTE — Assessment & Plan Note (Signed)
Her graves disease has been stable on her medicines.

## 2020-03-25 ENCOUNTER — Other Ambulatory Visit: Payer: Self-pay

## 2020-03-25 DIAGNOSIS — E0500 Thyrotoxicosis with diffuse goiter without thyrotoxic crisis or storm: Secondary | ICD-10-CM

## 2020-03-25 DIAGNOSIS — F419 Anxiety disorder, unspecified: Secondary | ICD-10-CM

## 2020-03-25 DIAGNOSIS — I10 Essential (primary) hypertension: Secondary | ICD-10-CM

## 2020-03-25 LAB — CBC WITH DIFFERENTIAL/PLATELET
Basophils Absolute: 0.1 10*3/uL (ref 0.0–0.2)
Basos: 1 %
EOS (ABSOLUTE): 0.1 10*3/uL (ref 0.0–0.4)
Eos: 2 %
Hematocrit: 38.4 % (ref 34.0–46.6)
Hemoglobin: 11.7 g/dL (ref 11.1–15.9)
Immature Grans (Abs): 0 10*3/uL (ref 0.0–0.1)
Immature Granulocytes: 0 %
Lymphocytes Absolute: 1.4 10*3/uL (ref 0.7–3.1)
Lymphs: 25 %
MCH: 25 pg — ABNORMAL LOW (ref 26.6–33.0)
MCHC: 30.5 g/dL — ABNORMAL LOW (ref 31.5–35.7)
MCV: 82 fL (ref 79–97)
Monocytes Absolute: 0.5 10*3/uL (ref 0.1–0.9)
Monocytes: 9 %
Neutrophils Absolute: 3.5 10*3/uL (ref 1.4–7.0)
Neutrophils: 63 %
Platelets: 305 10*3/uL (ref 150–450)
RBC: 4.68 x10E6/uL (ref 3.77–5.28)
RDW: 14.9 % (ref 11.7–15.4)
WBC: 5.5 10*3/uL (ref 3.4–10.8)

## 2020-03-25 LAB — COMPREHENSIVE METABOLIC PANEL
ALT: 9 IU/L (ref 0–32)
AST: 13 IU/L (ref 0–40)
Albumin/Globulin Ratio: 2 (ref 1.2–2.2)
Albumin: 4.3 g/dL (ref 3.8–4.8)
Alkaline Phosphatase: 146 IU/L — ABNORMAL HIGH (ref 39–117)
BUN/Creatinine Ratio: 24 (ref 12–28)
BUN: 17 mg/dL (ref 8–27)
Bilirubin Total: 0.3 mg/dL (ref 0.0–1.2)
CO2: 24 mmol/L (ref 20–29)
Calcium: 9.8 mg/dL (ref 8.7–10.3)
Chloride: 106 mmol/L (ref 96–106)
Creatinine, Ser: 0.72 mg/dL (ref 0.57–1.00)
GFR calc Af Amer: 99 mL/min/{1.73_m2} (ref >59)
GFR calc non Af Amer: 86 mL/min/{1.73_m2} (ref >59)
Globulin, Total: 2.1 g/dL (ref 1.5–4.5)
Glucose: 95 mg/dL (ref 65–99)
Potassium: 5.1 mmol/L (ref 3.5–5.2)
Sodium: 142 mmol/L (ref 134–144)
Total Protein: 6.4 g/dL (ref 6.0–8.5)

## 2020-03-25 LAB — T4, FREE: Free T4: 1.41 ng/dL (ref 0.82–1.77)

## 2020-03-25 LAB — TSH: TSH: 2.35 u[IU]/mL (ref 0.450–4.500)

## 2020-03-25 NOTE — Progress Notes (Signed)
CCM appointment was on 03/19/2020 with Victoria Pena, PharmD.

## 2020-03-25 NOTE — Progress Notes (Signed)
No anemia, kidney and liver tests ok, tsh normal, freet4 normal lp

## 2020-03-31 ENCOUNTER — Other Ambulatory Visit: Payer: Self-pay

## 2020-03-31 DIAGNOSIS — F064 Anxiety disorder due to known physiological condition: Secondary | ICD-10-CM

## 2020-03-31 MED ORDER — ALPRAZOLAM 0.5 MG PO TABS: 0.5000 mg | ORAL_TABLET | Freq: Two times a day (BID) | ORAL | 1 refills | Status: DC | PRN

## 2020-04-05 ENCOUNTER — Other Ambulatory Visit: Payer: Self-pay | Admitting: Legal Medicine

## 2020-04-05 DIAGNOSIS — F064 Anxiety disorder due to known physiological condition: Secondary | ICD-10-CM

## 2020-04-05 MED ORDER — ALPRAZOLAM 0.5 MG PO TABS: 0.5000 mg | ORAL_TABLET | Freq: Two times a day (BID) | ORAL | 1 refills | Status: DC | PRN

## 2020-04-08 ENCOUNTER — Ambulatory Visit: Payer: Medicare HMO | Admitting: Sports Medicine

## 2020-04-09 ENCOUNTER — Ambulatory Visit: Payer: Self-pay

## 2020-04-09 ENCOUNTER — Other Ambulatory Visit: Payer: Self-pay

## 2020-04-09 ENCOUNTER — Encounter: Payer: Self-pay | Admitting: Sports Medicine

## 2020-04-09 ENCOUNTER — Other Ambulatory Visit: Payer: Self-pay | Admitting: Sports Medicine

## 2020-04-09 ENCOUNTER — Ambulatory Visit: Payer: Medicare HMO | Admitting: Sports Medicine

## 2020-04-09 DIAGNOSIS — M779 Enthesopathy, unspecified: Secondary | ICD-10-CM

## 2020-04-09 DIAGNOSIS — M79605 Pain in left leg: Secondary | ICD-10-CM

## 2020-04-09 DIAGNOSIS — M76829 Posterior tibial tendinitis, unspecified leg: Secondary | ICD-10-CM | POA: Diagnosis not present

## 2020-04-09 DIAGNOSIS — R269 Unspecified abnormalities of gait and mobility: Secondary | ICD-10-CM

## 2020-04-09 DIAGNOSIS — M797 Fibromyalgia: Secondary | ICD-10-CM

## 2020-04-09 DIAGNOSIS — F419 Anxiety disorder, unspecified: Secondary | ICD-10-CM

## 2020-04-09 DIAGNOSIS — M79672 Pain in left foot: Secondary | ICD-10-CM

## 2020-04-09 DIAGNOSIS — M2142 Flat foot [pes planus] (acquired), left foot: Secondary | ICD-10-CM

## 2020-04-09 DIAGNOSIS — M2141 Flat foot [pes planus] (acquired), right foot: Secondary | ICD-10-CM | POA: Diagnosis not present

## 2020-04-09 DIAGNOSIS — M778 Other enthesopathies, not elsewhere classified: Secondary | ICD-10-CM

## 2020-04-09 MED ORDER — TRIAMCINOLONE ACETONIDE 10 MG/ML IJ SUSP
10.0000 mg | Freq: Once | INTRAMUSCULAR | Status: AC
  Administered 2020-04-09: 10 mg

## 2020-04-09 NOTE — Progress Notes (Signed)
Subjective: Victoria Pena is a 69 y.o. female patient who returns to office for follow up evaluation of left posterior medial foot pain that radiates to back of the heel that has been getting worse over the last couple of months. Pain 7/10 sharp constant and has really affected her mobility and her life patient reports that she cannot do much of anything cannot retire at this point and still has to work but by the end of the day the pain is worse and reports that even when she goes shopping at the grocery store she has to use a electric cart.  Patient also reports that this pain has her off balance and she has added some additional buildup in her right shoe to accommodate for it.  Patient also admits to some swelling and that pain is worse with walking past orthotics that our office has made she return them because they were so uncomfortable at this point she is tolerating softer tennis shoe with gel cushioning and heel pad and has been using a compression sleeve and a cane.  Patient reports that she has been doing gentle stretching but does notice that sometimes when she stretches her foot on the left and turns it at work she gets some increased pain along the ankle.  No other acute symptoms reported.  Patient Active Problem List   Diagnosis Date Noted  . Current mild episode of major depressive disorder ([place]) 03/24/2020  . Gastroesophageal reflux disease without esophagitis 03/24/2020  . Fibromyalgia 03/24/2020  . Arthropathy of lumbar facet joint 04/11/2019  . S/P lumbar spinal fusion 05/11/2018  . Anxiety 12/27/2016  . Benign essential hypertension 12/27/2016  . Depression 12/27/2016  . Graves disease 12/27/2016  . Palpitations 12/27/2016  . Adenocarcinoma of right lung, stage 1 ([place]) 07/26/2016  . Reactive airways dysfunction syndrome, unspecified asthma severity, uncomplicated ([place]) 29/51/8841  . Cough 09/03/2015  . S/P lobectomy of lung 07/26/2015  . HLD (hyperlipidemia) 07/22/2015  .  Shortness of breath 06/10/2015  . Acquired spondylolisthesis 05/12/2015  . Postoperative hypothyroidism 01/17/2015    Current Outpatient Medications on File Prior to Visit  Medication Sig Dispense Refill  . albuterol (PROVENTIL HFA;VENTOLIN HFA) 108 (90 Base) MCG/ACT inhaler Inhale 2 puffs into the lungs every 4 (four) hours as needed for wheezing or shortness of breath.     . ALPRAZolam (XANAX) 0.5 MG tablet Take 1 tablet (0.5 mg total) by mouth 2 (two) times daily as needed for anxiety. 60 tablet 1  . aspirin 81 MG tablet Take 81 mg by mouth daily.    Marland Kitchen buPROPion (WELLBUTRIN XL) 150 MG 24 hr tablet TAKE 1 TABLET BY MOUTH EVERY DAY 90 tablet 2  . calcium carbonate (OS-CAL) 1250 (500 Ca) MG chewable tablet Chew by mouth.    . calcium carbonate (TUMS - DOSED IN MG ELEMENTAL CALCIUM) 500 MG chewable tablet Chew 1 tablet by mouth 2 (two) times daily as needed.     Marland Kitchen ibuprofen (ADVIL,MOTRIN) 200 MG tablet Take 600 mg by mouth 2 (two) times daily as needed for mild pain.    Marland Kitchen levothyroxine (SYNTHROID, LEVOTHROID) 125 MCG tablet Take 125 mcg by mouth daily before breakfast.    . losartan (COZAAR) 50 MG tablet Take 50 mg by mouth daily.  0  . omeprazole (PRILOSEC) 40 MG capsule     . Spacer/Aero-Holding Chambers (AEROCHAMBER W/FLOWSIGNAL) inhaler Chamber use with Ventolin    . traMADol (ULTRAM) 50 MG tablet TAKE 1 TABLET(50 MG) BY MOUTH EVERY 4 HOURS  AS NEEDED 120 tablet 2   No current facility-administered medications on file prior to visit.    Allergies  Allergen Reactions  . Dextromethorphan Hbr Shortness Of Breath  . Keflex [Cephalexin] Shortness Of Breath    Hard time breathing  . Robaxin [Methocarbamol] Nausea Only    Stomach pain . Could not eat  . Bactrim [Sulfamethoxazole-Trimethoprim] Itching  . Hydroxyzine Other (See Comments) and Swelling    Numbness in lips  . Prednisone & Diphenhydramine Other (See Comments)    Face flushed, heart racing  . Ciprofloxacin   .  Dextromethorphan Polistirex Er   . Meloxicam Other (See Comments)    Headache, body aches  . Nitrofurantoin   . Diphenhydramine Hcl Palpitations  . Moxifloxacin Rash  . Prednisone Other (See Comments) and Palpitations    "flushing"     Objective:  General: Alert and oriented x3 in no acute distress  Dermatology: All surgical scars well-healed no open lesions bilateral lower extremities, no webspace macerations, no ecchymosis bilateral, all nails x 10 are well manicured.  Vascular: Dorsalis Pedis and Posterior Tibial pedal pulses 1/4, Capillary Fill Time 5 seconds, + pedal hair growth bilateral, no edema bilateral lower extremities, Temperature gradient within normal limits.  Neurology: Johney Maine sensation intact via light touch bilateral.  Musculoskeletal: Mild tenderness with palpation at insertion of the Achilles on left, there is moderate tenderness palpation to the lateral ankle and at the level of the peroneal tendon course, decreased pain to the sinus tarsi, there is mild tenderness at the medial ankle along the posterior tibial tendon course with significant pes planus deformity with lateral ankle impingement on weightbearing on the left greater than the right.  Right leg is noted to be shorter as compared to the left approximate only at least half inch.   Xrays  Left foot   Impression: Compared to last visit x-rays there is no acute changes. Normal osseous mineralization. Joint spaces preserved except at midtarsal joint where there is significant breech supportive of severe pes planus with forefoot abduction. No fracture/dislocation/boney destruction. Calcaneal spur present. Kager's triangle intact with no obliteration. No soft tissue abnormalities or radiopaque foreign bodies.   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 Post-injection care discussed with patient.  -Patient to continue with compression sleeve -Continue with cane for stability in gait -Advised patient to avoid overstretching -Advised patient to continue with rest ice elevation Tylenol/tramadol/Aspercreme as needed -No improvement will consider MRI and advised  patient to call office if her symptoms are not better after 1 week for [place] to proceed with ordering an MRI to rule out any other tendon or ligament changes in the setting of chronic symptoms -Recommend good supportive shoes daily and advised patient that she would benefit from seeing Liliane Channel to see how we can best make a new pair of orthotics or a brace to help to control her significant pes planus to take stress off these chronically inflamed joints/tendon areas that are limiting her and causing her pain -Patient to return to office for assessment by Liliane Channel for orthotic/brace or sooner if condition worsens.  Patient has had custom orthotics made by our office in the past that did not work and she had to return them. -Also advised patient due to her significant foot deformity and chronicity of symptoms it is highly unlikely that we could get her out of complete pain however the goal is to reduce the pain  and make it more tolerable as well as to be able to allow her to function where she is walking and standing longer and feeling more supported especially with her history of back and gait problems that are also contributing to her symptoms.  Landis Martins, DPM

## 2020-04-15 ENCOUNTER — Other Ambulatory Visit: Payer: Self-pay | Admitting: Legal Medicine

## 2020-04-15 DIAGNOSIS — M5135 Other intervertebral disc degeneration, thoracolumbar region: Secondary | ICD-10-CM | POA: Diagnosis not present

## 2020-04-15 DIAGNOSIS — M961 Postlaminectomy syndrome, not elsewhere classified: Secondary | ICD-10-CM | POA: Insufficient documentation

## 2020-04-15 DIAGNOSIS — M79672 Pain in left foot: Secondary | ICD-10-CM | POA: Diagnosis not present

## 2020-04-15 DIAGNOSIS — M47812 Spondylosis without myelopathy or radiculopathy, cervical region: Secondary | ICD-10-CM | POA: Diagnosis not present

## 2020-04-15 DIAGNOSIS — M50321 Other cervical disc degeneration at C4-C5 level: Secondary | ICD-10-CM | POA: Diagnosis not present

## 2020-04-15 DIAGNOSIS — M4802 Spinal stenosis, cervical region: Secondary | ICD-10-CM | POA: Diagnosis not present

## 2020-04-15 DIAGNOSIS — G8929 Other chronic pain: Secondary | ICD-10-CM | POA: Diagnosis not present

## 2020-04-15 DIAGNOSIS — C3491 Malignant neoplasm of unspecified part of right bronchus or lung: Secondary | ICD-10-CM

## 2020-04-15 DIAGNOSIS — Z96652 Presence of left artificial knee joint: Secondary | ICD-10-CM | POA: Insufficient documentation

## 2020-04-15 DIAGNOSIS — Z981 Arthrodesis status: Secondary | ICD-10-CM | POA: Diagnosis not present

## 2020-04-15 DIAGNOSIS — M542 Cervicalgia: Secondary | ICD-10-CM | POA: Diagnosis not present

## 2020-04-15 DIAGNOSIS — M5126 Other intervertebral disc displacement, lumbar region: Secondary | ICD-10-CM | POA: Diagnosis not present

## 2020-04-15 DIAGNOSIS — M8580 Other specified disorders of bone density and structure, unspecified site: Secondary | ICD-10-CM | POA: Diagnosis not present

## 2020-04-15 DIAGNOSIS — Z79891 Long term (current) use of opiate analgesic: Secondary | ICD-10-CM | POA: Diagnosis not present

## 2020-04-18 ENCOUNTER — Telehealth: Payer: Self-pay

## 2020-04-18 NOTE — Telephone Encounter (Signed)
Okay great.  Have patient to keep her appointment with Liliane Channel on 5/12 if pain worsens before then call office to return sooner otherwise if pain is doing a little bit better keep her appointment on 5/12 Thanks Dr. Cannon Kettle

## 2020-04-18 NOTE — Telephone Encounter (Signed)
Pt called and LVM stating Dr. Cannon Kettle had suggested for her to call the office to let us know how she did after the shot. Pt states the steroid on her Lt ankle did helped to some degree

## 2020-04-18 NOTE — Telephone Encounter (Signed)
Pt was notified of Dr's suggestion

## 2020-04-21 ENCOUNTER — Encounter: Payer: Self-pay | Admitting: Legal Medicine

## 2020-04-21 DIAGNOSIS — Z1231 Encounter for screening mammogram for malignant neoplasm of breast: Secondary | ICD-10-CM | POA: Diagnosis not present

## 2020-04-28 ENCOUNTER — Telehealth: Payer: Self-pay

## 2020-04-28 ENCOUNTER — Other Ambulatory Visit: Payer: Self-pay | Admitting: Legal Medicine

## 2020-04-28 DIAGNOSIS — M4310 Spondylolisthesis, site unspecified: Secondary | ICD-10-CM

## 2020-04-28 MED ORDER — TRAMADOL HCL 50 MG PO TABS: 50.0000 mg | ORAL_TABLET | Freq: Four times a day (QID) | ORAL | 2 refills | Status: DC

## 2020-04-28 NOTE — Telephone Encounter (Signed)
Patient was going to the pain clinic, she was placed on hydrocodone but did not tolerate. She also requested a refill of tramidol, pain clinic would not give tramidol if patient was on xanax. Patient states she is not going to see the pain clinic anymore and wants a refill of her tramidol.

## 2020-04-28 NOTE — Telephone Encounter (Signed)
Tramadol called in lp

## 2020-04-30 ENCOUNTER — Other Ambulatory Visit: Payer: Medicare HMO | Admitting: Orthotics

## 2020-05-12 ENCOUNTER — Other Ambulatory Visit: Payer: Self-pay

## 2020-05-20 ENCOUNTER — Other Ambulatory Visit: Payer: Medicare HMO | Admitting: Orthotics

## 2020-05-20 ENCOUNTER — Other Ambulatory Visit: Payer: Self-pay

## 2020-05-20 ENCOUNTER — Telehealth: Payer: Self-pay | Admitting: Sports Medicine

## 2020-06-04 ENCOUNTER — Encounter: Payer: Self-pay | Admitting: Legal Medicine

## 2020-06-04 ENCOUNTER — Ambulatory Visit (INDEPENDENT_AMBULATORY_CARE_PROVIDER_SITE_OTHER): Payer: Medicare HMO | Admitting: Legal Medicine

## 2020-06-04 ENCOUNTER — Other Ambulatory Visit: Payer: Self-pay

## 2020-06-04 DIAGNOSIS — H6123 Impacted cerumen, bilateral: Secondary | ICD-10-CM

## 2020-06-04 DIAGNOSIS — H6120 Impacted cerumen, unspecified ear: Secondary | ICD-10-CM | POA: Insufficient documentation

## 2020-06-04 DIAGNOSIS — F064 Anxiety disorder due to known physiological condition: Secondary | ICD-10-CM

## 2020-06-04 MED ORDER — ALPRAZOLAM 0.5 MG PO TABS: 0.5000 mg | ORAL_TABLET | Freq: Two times a day (BID) | ORAL | 3 refills | Status: DC | PRN

## 2020-06-04 NOTE — Assessment & Plan Note (Signed)
The ears were lavaged clear.  Hearing returned to normal.

## 2020-06-04 NOTE — Progress Notes (Signed)
Established Patient Office Visit  Subjective:  Patient ID: Victoria Pena, female    DOB: 12/02/1951  Age: 69 y.o. MRN: [id number]  CC:  Chief Complaint  Patient presents with  . Ear stopped up    HPI Victoria Pena [facility] presents for hearing loss for 10 days especially left > right.  She has some tinnitus.  Past Medical History:  Diagnosis Date  . Adenocarcinoma of right lung, stage 1 ([place]) 07/26/2016  . Anxiety   . Arthritis   . COPD (chronic obstructive pulmonary disease) ([place])    pt denies  . Depression   . GERD (gastroesophageal reflux disease)   . Hypertension   . Sleep apnea    "very very mild" no CPAP  . Thyroid disease     Past Surgical History:  Procedure Laterality Date  . ANKLE SURGERY     bilateral  . APPENDECTOMY    . CHOLECYSTECTOMY    . REPLACEMENT TOTAL KNEE Left   . THYROIDECTOMY      Family History  Problem Relation Age of Onset  . Cancer Mother   . Cancer Father   . Cancer Brother     Social History   Socioeconomic History  . Marital status: Widowed    Spouse name: Not on file  . Number of children: Not on file  . Years of education: Not on file  . Highest education level: Not on file  Occupational History  . Not on file  Tobacco Use  . Smoking status: Former Smoker    Packs/day: 1.50    Years: 35.00    Pack years: 52.50    Quit date: 07/27/1999    Years since quitting: 20.8  . Smokeless tobacco: Never Used  Vaping Use  . Vaping Use: Never used  Substance and Sexual Activity  . Alcohol use: No  . Drug use: No  . Sexual activity: Not Currently  Other Topics Concern  . Not on file  Social History Narrative  . Not on file   Social Determinants of Health   Financial Resource Strain:   . Difficulty of Paying Living Expenses:   Food Insecurity:   . Worried About Charity fundraiser in the Last Year:   . Arboriculturist in the Last Year:   Transportation Needs: No Transportation Needs  . Lack of Transportation (Medical): No   . Lack of Transportation (Non-Medical): No  Physical Activity:   . Days of Exercise per Week:   . Minutes of Exercise per Session:   Stress: Stress Concern Present  . Feeling of Stress : Very much  Social Connections:   . Frequency of Communication with Friends and Family:   . Frequency of Social Gatherings with Friends and Family:   . Attends Religious Services:   . Active Member of Clubs or Organizations:   . Attends Archivist Meetings:   Marland Kitchen Marital Status:   Intimate Partner Violence:   . Fear of Current or Ex-Partner:   . Emotionally Abused:   Marland Kitchen Physically Abused:   . Sexually Abused:     Outpatient Medications Prior to Visit  Medication Sig Dispense Refill  . albuterol (PROVENTIL HFA;VENTOLIN HFA) 108 (90 Base) MCG/ACT inhaler Inhale 2 puffs into the lungs every 4 (four) hours as needed for wheezing or shortness of breath.     Marland Kitchen aspirin 81 MG tablet Take 81 mg by mouth daily.    Marland Kitchen buPROPion (WELLBUTRIN XL) 150 MG 24 hr tablet TAKE 1 TABLET  BY MOUTH EVERY DAY 90 tablet 2  . calcium carbonate (OS-CAL) 1250 (500 Ca) MG chewable tablet Chew by mouth.    . calcium carbonate (TUMS - DOSED IN MG ELEMENTAL CALCIUM) 500 MG chewable tablet Chew 1 tablet by mouth 2 (two) times daily as needed.     Marland Kitchen ibuprofen (ADVIL,MOTRIN) 200 MG tablet Take 600 mg by mouth 2 (two) times daily as needed for mild pain.    Marland Kitchen levothyroxine (SYNTHROID) 125 MCG tablet TAKE 1 TABLET EVERY DAY 90 tablet 2  . losartan (COZAAR) 50 MG tablet Take 50 mg by mouth daily.  0  . omeprazole (PRILOSEC) 40 MG capsule     . Spacer/Aero-Holding Chambers (AEROCHAMBER W/FLOWSIGNAL) inhaler Chamber use with Ventolin    . traMADol (ULTRAM) 50 MG tablet Take 1 tablet (50 mg total) by mouth 4 (four) times daily. 120 tablet 2  . ALPRAZolam (XANAX) 0.5 MG tablet Take 1 tablet (0.5 mg total) by mouth 2 (two) times daily as needed for anxiety. 60 tablet 1   No facility-administered medications prior to visit.     Allergies  Allergen Reactions  . Dextromethorphan Hbr Shortness Of Breath  . Keflex [Cephalexin] Shortness Of Breath    Hard time breathing  . Robaxin [Methocarbamol] Nausea Only    Stomach pain . Could not eat  . Bactrim [Sulfamethoxazole-Trimethoprim] Itching  . Hydroxyzine Other (See Comments) and Swelling    Numbness in lips  . Prednisone & Diphenhydramine Other (See Comments)    Face flushed, heart racing  . Ciprofloxacin   . Dextromethorphan Polistirex Er   . Meloxicam Other (See Comments)    Headache, body aches  . Nitrofurantoin   . Diphenhydramine Hcl Palpitations  . Moxifloxacin Rash  . Prednisone Other (See Comments) and Palpitations    "flushing"     ROS Review of Systems  Constitutional: Negative.   HENT: Negative.   Eyes: Negative.   Respiratory: Negative.   Cardiovascular: Negative.   Gastrointestinal: Negative.   Endocrine: Negative.   Genitourinary: Negative.   Musculoskeletal: Negative.   Skin: Negative.   Neurological: Negative.   Psychiatric/Behavioral: Negative.       Objective:    Physical Exam Vitals reviewed.  Constitutional:      Appearance: Normal appearance.  HENT:     Head: Normocephalic and atraumatic.     Right Ear: Tympanic membrane normal.     Left Ear: There is impacted cerumen.     Nose: Nose normal.     Mouth/Throat:     Mouth: Mucous membranes are dry.  Eyes:     Extraocular Movements: Extraocular movements intact.     Conjunctiva/sclera: Conjunctivae normal.     Pupils: Pupils are equal, round, and reactive to light.  Cardiovascular:     Rate and Rhythm: Normal rate and regular rhythm.     Pulses: Normal pulses.     Heart sounds: Normal heart sounds.  Pulmonary:     Effort: Pulmonary effort is normal.     Breath sounds: Normal breath sounds.  Abdominal:     General: Abdomen is flat.     Palpations: Abdomen is soft.  Musculoskeletal:     Cervical back: Normal range of motion and neck supple.  Neurological:      Mental Status: She is alert.     BP 124/84   Pulse 84   Temp 97.6 F (36.4 C)   Resp 16   Ht '5\' 6"'  (1.676 m)   Wt 176 lb (79.8 kg)  LMP  (LMP Unknown)   SpO2 97%   BMI 28.41 kg/m  Wt Readings from Last 3 Encounters:  06/04/20 176 lb (79.8 kg)  03/24/20 172 lb 3.2 oz (78.1 kg)  01/28/20 171 lb 0.3 oz (77.6 kg)     Health Maintenance Due  Topic Date Due  . Hepatitis C Screening  Never done  . TETANUS/TDAP  Never done  . MAMMOGRAM  Never done  . COLONOSCOPY  Never done  . PNA vac Low Risk Adult (1 of 2 - PCV13) Never done    There are no preventive care reminders to display for this patient.  Lab Results  Component Value Date   TSH 2.350 03/24/2020   Lab Results  Component Value Date   WBC 5.5 03/24/2020   HGB 11.7 03/24/2020   HCT 38.4 03/24/2020   MCV 82 03/24/2020   PLT 305 03/24/2020   Lab Results  Component Value Date   NA 142 03/24/2020   K 5.1 03/24/2020   CHLORIDE 107 11/22/2017   CO2 24 03/24/2020   GLUCOSE 95 03/24/2020   BUN 17 03/24/2020   CREATININE 0.72 03/24/2020   BILITOT 0.3 03/24/2020   ALKPHOS 146 (H) 03/24/2020   AST 13 03/24/2020   ALT 9 03/24/2020   PROT 6.4 03/24/2020   ALBUMIN 4.3 03/24/2020   CALCIUM 9.8 03/24/2020   ANIONGAP 6 01/25/2020   EGFR >60 11/22/2017   No results found for: CHOL No results found for: HDL No results found for: LDLCALC No results found for: TRIG No results found for: CHOLHDL No results found for: HGBA1C    Assessment & Plan:   Problem List Items Addressed This Visit      Nervous and Auditory   Cerumen impaction    The ears were lavaged clear.  Hearing returned to normal.           No orders of the defined types were placed in this encounter.   Follow-up: Return if symptoms worsen or fail to improve.    Reinaldo Meeker, MD

## 2020-06-18 ENCOUNTER — Telehealth: Payer: Medicare HMO

## 2020-06-18 ENCOUNTER — Ambulatory Visit: Payer: Medicare HMO

## 2020-06-18 DIAGNOSIS — I10 Essential (primary) hypertension: Secondary | ICD-10-CM

## 2020-06-18 DIAGNOSIS — K219 Gastro-esophageal reflux disease without esophagitis: Secondary | ICD-10-CM

## 2020-06-18 NOTE — Chronic Care Management (AMB) (Deleted)
Chronic Care Management Pharmacy  Name: Victoria Pena  MRN: [id number] DOB: [birth year]  Chief Complaint/ HPI  Victoria Pena,  69 y.o. , female presents for their Initial CCM visit with the clinical pharmacist via telephone due to COVID-19 Pandemic.  PCP : Lillard Anes, MD  Their chronic conditions include: HTN, Graves Disease, Anxiety, Depression, Palpitations, Adenocarcinoma of right lung, cough, GERD, HLD. .   Office Visits: 06/04/2020 - tinnitus/ear clogged - cleared ears in office.  03/24/2020 - No anemia, kidney and liver tests ok, tsh normal, freet4 normal. Refer to pain clinic. Increase tramadol to qid. 01/09/2020 - Exposure to [place] with negative test. No medications.  10/31/2019 - Sinusitis with negative COVID test - azithromycin.  04/24/2019 - Last labs drawn. No anemia noted. Normal kidney and liver function. TSH normal.   Consult Visit: 04/21/2020 - normal mammo gram 04/15/2020 - pain management - trial of hydrocodone for pain.  04/09/2020 - podiatry - tendonitis/foot pain. Injection to tendon to reduce pain. Referred for orthotics. Use tylenol/tramadol/aspercream as needed.  01/28/2020 - Oncology visit. MD recommended a lymph node biopsy to rule out malignancy.  09/19/2019- seen by Podiatry for tendonitis. Recommended ice, rest, aspercream, tylenol and tramadol.  Medications: Outpatient Encounter Medications as of 06/18/2020  Medication Sig  . albuterol (PROVENTIL HFA;VENTOLIN HFA) 108 (90 Base) MCG/ACT inhaler Inhale 2 puffs into the lungs every 4 (four) hours as needed for wheezing or shortness of breath.   . ALPRAZolam (XANAX) 0.5 MG tablet Take 1 tablet (0.5 mg total) by mouth 2 (two) times daily as needed for anxiety.  Marland Kitchen aspirin 81 MG tablet Take 81 mg by mouth daily.  Marland Kitchen buPROPion (WELLBUTRIN XL) 150 MG 24 hr tablet TAKE 1 TABLET BY MOUTH EVERY DAY  . calcium carbonate (OS-CAL) 1250 (500 Ca) MG chewable tablet Chew by mouth.  . calcium carbonate  (TUMS - DOSED IN MG ELEMENTAL CALCIUM) 500 MG chewable tablet Chew 1 tablet by mouth 2 (two) times daily as needed.   Marland Kitchen ibuprofen (ADVIL,MOTRIN) 200 MG tablet Take 600 mg by mouth 2 (two) times daily as needed for mild pain.  Marland Kitchen levothyroxine (SYNTHROID) 125 MCG tablet TAKE 1 TABLET EVERY DAY  . losartan (COZAAR) 50 MG tablet Take 50 mg by mouth daily.  Marland Kitchen omeprazole (PRILOSEC) 40 MG capsule   . Spacer/Aero-Holding Chambers (AEROCHAMBER W/FLOWSIGNAL) inhaler Chamber use with Ventolin  . traMADol (ULTRAM) 50 MG tablet Take 1 tablet (50 mg total) by mouth 4 (four) times daily.   No facility-administered encounter medications on file as of 06/18/2020.   SDOH Screenings   Alcohol Screen:   . Last Alcohol Screening Score (AUDIT):   Depression (PHQ2-9): Medium Risk  . PHQ-2 Score: 17  Financial Resource Strain:   . Difficulty of Paying Living Expenses:   Food Insecurity:   . Worried About Charity fundraiser in the Last Year:   . [place] in the Last Year:   Housing:   . Last Housing Risk Score:   Physical Activity:   . Days of Exercise per Week:   . Minutes of Exercise per Session:   Social Connections:   . Frequency of Communication with Friends and Family:   . Frequency of Social Gatherings with Friends and Family:   . Attends Religious Services:   . Active Member of Clubs or Organizations:   . Attends Archivist Meetings:   Marland Kitchen Marital Status:   Stress: Stress Concern Present  . Feeling of Stress :  Very much  Tobacco Use: Medium Risk  . Smoking Tobacco Use: Former Smoker  . Smokeless Tobacco Use: Never Used  Transportation Needs: No Transportation Needs  . Lack of Transportation (Medical): No  . Lack of Transportation (Non-Medical): No    Current Diagnosis/Assessment:  Goals Addressed   None     Hypertension   Office blood pressures are  BP Readings from Last 3 Encounters:  06/04/20 124/84  03/24/20 140/80  01/28/20 137/90    Patient has failed  these meds in the past: non listed Patient is currently controlled on the following medications: Losartan 50 mg  Patient checks BP at home when feeling symptomatic  Patient home BP readings are ranging: 90/60-150/80  We discussed diet and exercise extensively. Patient is aware of signs/symptoms of low bp and checks if needed.   Plan  Continue current medications     and  Graves Disease:    Patient has failed these meds in past: n/a Patient is currently controlled on the following medications: levothyroxine 175 mcg  We discussed:  Patient is followed by opthamology due to limitations with Grave disease. She reports that she has limited eye mobility but has learned to compensate. Patient currently experiencing increased anxiety and depression.   Plan  Continue current medications. Recommend repeat blood work and visit with Dr. Henrene Pastor in the near future.     Lung Cancer/Cough:    Patient has failed these meds in past: previous chemotherapy for lung cancer and lobe removed Patient is currently controlled on the following medications: albuterol  We discussed:  diet and exercise extensively. Patient enjoys exercise but has been limited lately due to back/knee/hip/foot pain. She hopes to get addressed and begin exercising again. Patient reports a healthy diet.   Plan  Continue current medications   Pain/Arthritis:   Patient has failed these meds in past: tylenol, meloxicam, oxycodone, methocarbamol Patient is currently uncontrolled on the following medications: tramadol, ibuprofen  We discussed:  Patient is experiencing a lot of pain in her feet/back/hip/knees. She is following up with podiatry for inserts for her shoes. She also plans to see Dr.Perry shortly to address pain. Pain is unrelieved by rest, ice, aspercream and pain medications.   Plan  Continue current medications and schedule visit with PCP/podiatry.    GERD   Patient has failed these meds in past:  protonix Patient is currently controlled on the following medications: omeprazole, Tums  We discussed:  diet and exercise extensively. Patient reports good control and rarely using Tums. Patient avoids trigger foods.   Plan  Continue current medications   Anxiety/Depression   Patient has failed these meds in past: effexor, lexapro Patient is currently uncontrolled on the following medications: alprazolam 0.5 mg, bupropion xl 150 mg,   We discussed: Patient feels depression and increased anxiety are coming directly from the pain limiting her ability to complete normal activities. She is an active person who enjoys exercise, working in her yard and crafting. Currently the pain is limiting much of that.   Plan  Continue current medications until visit with Dr. Henrene Pastor soon.    Osteopenia / Osteoporosis   Last DEXA Scan: 04/2018  T-Score femoral neck: -3  T-Score lumbar spine: -1.6   No results found for: VD25OH   Patient is a candidate for pharmacologic treatment due to T-Score < -2.5 in femoral neck  Patient has failed these meds in past: *** Patient is currently {CHL Controlled/[phone]} on the following medications:  . ***  We discussed:  {Osteoporosis  Counseling:23892}  Plan  Continue {CHL [facility] Pharmacy Plans:[phone]}   Health Maintenance   Patient is currently controlled on the following medications:    Metronidazole topically - rosacea  We discussed:  diet and exercise extensively. Her rosacea is flaring up likely due to anxiety. A new prescription was sent in to [facility] for metronidazole.   Plan  Continue current medications   Medication Management   Pt uses [facility] for all medications Weekly pill box Pt endorses great compliance  We discussed: Good compliance with medication.   Plan to see Dr. Henrene Pastor and Dr. Cannon Kettle soon for visits to address pain, worsened depression and anxiety.    Follow up: 3  months

## 2020-06-18 NOTE — Patient Instructions (Addendum)
Visit Information  Goals Addressed            This Visit's Progress   . Pharmacy Care Plan       CARE PLAN ENTRY (see longitudinal plan of care for additional care plan information)  Current Barriers:  . Chronic Disease Management support, education, and care coordination needs related to Hypertension, GERD, Depression, and Anxiety   Hypertension BP Readings from Last 3 Encounters:  06/04/20 124/84  03/24/20 140/80  01/28/20 137/90   . Pharmacist Clinical Goal(s): o Over the next 90 days, patient will work with PharmD and providers to maintain BP goal <140/90 . Current regimen:  o Losartan 50 mg daily . Interventions: o Recommend resume walking after pain resolved with orthotics.  . Patient self care activities - Over the next 90 days, patient will: o Check BP as needed, document, and provide at future appointments o Ensure daily salt intake < 2300 mg/day  GERD . Pharmacist Clinical Goal(s) o Over the next 90 days, patient will work with PharmD and providers to manage symptoms of acid reflux.  . Current regimen:  o Omeprazole 40 mg daily, Tums prn  . Interventions: o Recommend continuing to manage symptoms with small meals.  . Patient self care activities - Over the next 90 days, patient will: o Recommend continuing medications as prescribed.   Medication management . Pharmacist Clinical Goal(s): o Over the next 90 days, patient will work with PharmD and providers to maintain optimal medication adherence . Current pharmacy: [facility] . Interventions o Comprehensive medication review performed. o Continue current medication management strategy . Patient self care activities - Over the next 90 days, patient will: o Focus on medication adherence by continuing to use pill box.  o Take medications as prescribed o Report any questions or concerns to PharmD and/or provider(s)  Please see past updates related to this goal by clicking on the "Past Updates" button  in the selected goal         The patient verbalized understanding of instructions provided today and declined a print copy of patient instruction materials.   Telephone follow up appointment with pharmacy team member scheduled for: August 2021  Sherre Poot, PharmD, [facility] Clinical Pharmacist [facility] [phone] (office) [phone] (mobile)   Bone Density Test The bone density test uses a special type of X-ray to measure the amount of calcium and other minerals in your bones. It can measure bone density in the hip and the spine. The test procedure is similar to having a regular X-ray. This test may also be called:  Bone densitometry.  Bone mineral density test.  Dual-energy X-ray absorptiometry (DEXA). You may have this test to:  Diagnose a condition that causes weak or thin bones (osteoporosis).  Screen you for osteoporosis.  Predict your risk for a broken bone (fracture).  Determine how well your osteoporosis treatment is working. Tell a health care provider about:  Any allergies you have.  All medicines you are taking, including vitamins, herbs, eye drops, creams, and over-the-counter medicines.  Any problems you or family members have had with anesthetic medicines.  Any blood disorders you have.  Any surgeries you have had.  Any medical conditions you have.  Whether you are pregnant or may be pregnant.  Any medical tests you have had within the past 14 days that used contrast material. What are the risks? Generally, this is a safe procedure. However, it does expose you to a small amount of radiation, which can  slightly increase your cancer risk. What happens before the procedure?  Do not take any calcium supplements starting 24 hours before your test.  Remove all metal jewelry, eyeglasses, dental appliances, and any other metal objects. What happens during the procedure?   You will lie down on an exam table. There will be an X-ray  generator below you and an imaging device above you.  Other devices, such as boxes or braces, may be used to position your body properly for the scan.  The machine will slowly scan your body. You will need to keep still.  The images will show up on a screen in the room. Images will be examined by a specialist after your test is done. The procedure may vary among health care providers and hospitals. What happens after the procedure?  It is up to you to get your test results. Ask your health care provider, or the department that is doing the test, when your results will be ready. Summary  A bone density test is an imaging test that uses a type of X-ray to measure the amount of calcium and other minerals in your bones.  The test may be used to diagnose or screen you for a condition that causes weak or thin bones (osteoporosis), predict your risk for a broken bone (fracture), or determine how well your osteoporosis treatment is working.  Do not take any calcium supplements starting 24 hours before your test.  Ask your health care provider, or the department that is doing the test, when your results will be ready. This information is not intended to replace advice given to you by your health care provider. Make sure you discuss any questions you have with your health care provider. Document Revised: 12/22/2017 Document Reviewed: 10/10/2017 Elsevier Patient Education  [place].

## 2020-06-18 NOTE — Chronic Care Management (AMB) (Signed)
Chronic Care Management Pharmacy  Name: Victoria Pena  MRN: [id number] DOB: [birth year]  Chief Complaint/ HPI  Victoria Pena,  69 y.o. , female presents for their Initial CCM visit with the clinical pharmacist via telephone due to COVID-19 Pandemic.  PCP : Lillard Anes, MD  Their chronic conditions include: HTN, Graves Disease, Anxiety, Depression, Palpitations, Adenocarcinoma of right lung, cough, GERD, HLD.   Office Visits: 06/04/2020 - tinnitus/ear clogged - cleared ears in office.  03/24/2020 - No anemia, kidney and liver tests ok, tsh normal, freet4 normal. Refer to pain clinic. Increase tramadol to qid. 01/09/2020 - Exposure to [place] with negative test. No medications.  10/31/2019 - Sinusitis with negative COVID test - azithromycin.  04/24/2019 - Last labs drawn. No anemia noted. Normal kidney and liver function. TSH normal.   Consult Visit: 04/21/2020 - normal mammo gram 04/15/2020 - pain management - trial of hydrocodone for pain.  04/09/2020 - podiatry - tendonitis/foot pain. Injection to tendon to reduce pain. Referred for orthotics. Use tylenol/tramadol/aspercream as needed.  01/28/2020 - Oncology visit. MD recommended a lymph node biopsy to rule out malignancy.  09/19/2019- seen by Podiatry for tendonitis. Recommended ice, rest, aspercream, tylenol and tramadol.  Medications: Outpatient Encounter Medications as of 06/18/2020  Medication Sig  . albuterol (PROVENTIL HFA;VENTOLIN HFA) 108 (90 Base) MCG/ACT inhaler Inhale 2 puffs into the lungs every 4 (four) hours as needed for wheezing or shortness of breath.   . ALPRAZolam (XANAX) 0.5 MG tablet Take 1 tablet (0.5 mg total) by mouth 2 (two) times daily as needed for anxiety.  Marland Kitchen aspirin 81 MG tablet Take 81 mg by mouth daily.  Marland Kitchen buPROPion (WELLBUTRIN XL) 150 MG 24 hr tablet TAKE 1 TABLET BY MOUTH EVERY DAY  . calcium carbonate (OS-CAL) 1250 (500 Ca) MG chewable tablet Chew by mouth.  . calcium carbonate  (TUMS - DOSED IN MG ELEMENTAL CALCIUM) 500 MG chewable tablet Chew 1 tablet by mouth 2 (two) times daily as needed.   Marland Kitchen ibuprofen (ADVIL,MOTRIN) 200 MG tablet Take 600 mg by mouth 2 (two) times daily as needed for mild pain.  Marland Kitchen levothyroxine (SYNTHROID) 125 MCG tablet TAKE 1 TABLET EVERY DAY  . losartan (COZAAR) 50 MG tablet Take 50 mg by mouth daily.  Marland Kitchen omeprazole (PRILOSEC) 40 MG capsule   . Spacer/Aero-Holding Chambers (AEROCHAMBER W/FLOWSIGNAL) inhaler Chamber use with Ventolin  . traMADol (ULTRAM) 50 MG tablet Take 1 tablet (50 mg total) by mouth 4 (four) times daily.   No facility-administered encounter medications on file as of 06/18/2020.   SDOH Screenings   Alcohol Screen:   . Last Alcohol Screening Score (AUDIT):   Depression (PHQ2-9): Medium Risk  . PHQ-2 Score: 17  Financial Resource Strain:   . Difficulty of Paying Living Expenses:   Food Insecurity:   . Worried About Charity fundraiser in the Last Year:   . [place] in the Last Year:   Housing:   . Last Housing Risk Score:   Physical Activity:   . Days of Exercise per Week:   . Minutes of Exercise per Session:   Social Connections:   . Frequency of Communication with Friends and Family:   . Frequency of Social Gatherings with Friends and Family:   . Attends Religious Services:   . Active Member of Clubs or Organizations:   . Attends Archivist Meetings:   Marland Kitchen Marital Status:   Stress: Stress Concern Present  . Feeling of Stress :  Very much  Tobacco Use: Medium Risk  . Smoking Tobacco Use: Former Smoker  . Smokeless Tobacco Use: Never Used  Transportation Needs: No Transportation Needs  . Lack of Transportation (Medical): No  . Lack of Transportation (Non-Medical): No   Allergies  Allergen Reactions  . Dextromethorphan Hbr Shortness Of Breath  . Keflex [Cephalexin] Shortness Of Breath    Hard time breathing  . Robaxin [Methocarbamol] Nausea Only    Stomach pain . Could not eat  . Bactrim  [Sulfamethoxazole-Trimethoprim] Itching  . Hydroxyzine Other (See Comments) and Swelling    Numbness in lips  . Prednisone & Diphenhydramine Other (See Comments)    Face flushed, heart racing  . Ciprofloxacin   . Dextromethorphan Polistirex Er   . Meloxicam Other (See Comments)    Headache, body aches  . Nitrofurantoin   . Diphenhydramine Hcl Palpitations  . Moxifloxacin Rash  . Prednisone Other (See Comments) and Palpitations    "flushing"     Current Diagnosis/Assessment:  Goals Addressed            This Visit's Progress   . Pharmacy Care Plan       CARE PLAN ENTRY  Current Barriers:  . Chronic Disease Management support, education, and care coordination needs related to HTN, Pain, Depression, Anxiety, Grave's disease.   Pharmacist Clinical Goal(s):  . Ensure safety, efficacy, and affordability of medications . Recommend maintaining BP <140/90 mmHg   Interventions: . Comprehensive medication review performed. Patient Self Care Activities:  . Recommend an updated Dexa scan and considering oral bisphosphonate.  . Recommend resuming exercise regimen as tolerated once orthotics. . Recommend following the Dash diet guidance of incorporating fruits, vegetables, whole-grains, low-fat dairy products, lean meats, legumes and nuts for optimal health. . Recommend keeping sodium intake less than 1500 mg/day.   Please see past updates related to this goal by clicking on the "Past Updates" button in the selected goal         Hypertension   Office blood pressures are  BP Readings from Last 3 Encounters:  06/04/20 124/84  03/24/20 140/80  01/28/20 137/90    Patient has failed these meds in the past: non listed Patient is currently controlled on the following medications: Losartan 50 mg  Patient checks BP at home when feeling symptomatic  Patient home BP readings are ranging: 90/60-150/80  We discussed diet and exercise extensively. Patient is aware of signs/symptoms  of low bp and checks if needed.   Update 06/18/2020 - BP is controlled. Hoping to resume walking daily once orthotics are in.   Plan  Continue current medications     and  Graves Disease:    Lab Results  Component Value Date/Time   TSH 2.350 03/24/2020 11:24 AM   FREET4 1.41 03/24/2020 11:24 AM   Patient has failed these meds in past: n/a Patient is currently controlled on the following medications: levothyroxine 175 mcg  We discussed:  Patient is followed by opthamology due to limitations with Grave disease. She reports that she has limited eye mobility but has learned to compensate. Patient currently experiencing increased anxiety and depression.   Update 06/18/2020 - Patient's symptoms are stable at this time. Last bloodwork was at goal.   Plan  Continue current medications.  Lung Cancer/Cough:    Patient has failed these meds in past: previous chemotherapy for lung cancer and lobe removed Patient is currently controlled on the following medications: albuterol  We discussed:  diet and exercise extensively. Patient enjoys exercise but has  been limited lately due to back/knee/hip/foot pain. She hopes to get addressed and begin exercising again. Patient reports a healthy diet.   Plan  Continue current medications   Pain/Arthritis:   Patient has failed these meds in past: tylenol, meloxicam, oxycodone, methocarbamol Patient is currently uncontrolled on the following medications: tramadol, ibuprofen  We discussed:  Patient is experiencing a lot of pain in her feet/back/hip/knees. She is following up with podiatry for inserts for her shoes. She also plans to see Dr.Perry shortly to address pain. Pain is unrelieved by rest, ice, aspercream and pain medications.   Update 06/18/2020 - Patient cannot function on hydrocodone so stopped seeing pain management. Dr. Henrene Pastor is prescribing tramadol prn. She is picking up orthotics next week which hopefully will help. She thinks she  broke a rib yesterday when she slipped out of a chair and hit the wooden handle. She reports not being able to breathe deeply. Patient denies the need for an acute visit at this time. Has not had to take tramadol yet today but will take one later in the day.   Plan  Continue current medications.    GERD   Patient has failed these meds in past: protonix Patient is currently controlled on the following medications: omeprazole, Tums  We discussed:  diet and exercise extensively. Patient reports good control and rarely using Tums. Patient avoids trigger foods.   Plan  Continue current medications   Anxiety/Depression   Patient has failed these meds in past: effexor, lexapro Patient is currently uncontrolled on the following medications: alprazolam 0.5 mg, bupropion xl 150 mg,   We discussed: Patient feels depression and increased anxiety are coming directly from the pain limiting her ability to complete normal activities. She is an active person who enjoys exercise, working in her yard and crafting. Currently the pain is limiting much of that.   Update 06/18/2020 - Takes Xanax prn. Anxiety/depression is improved with reduced pain.   Plan  Continue current medications until visit with Dr. Henrene Pastor soon.    Osteopenia / Osteoporosis   Last DEXA Scan: 04/2018  T-Score femoral neck: -3  T-Score lumbar spine: -1.6   No results found for: VD25OH   Patient is a candidate for pharmacologic treatment due to T-Score < -2.5 in femoral neck  Patient has failed these meds in past: n/a Patient is currently uncontrolled on the following medications:  . Calcium Carbonate with D  We discussed:  Recommend [id number] units of vitamin D daily. Recommend 1200 mg of calcium daily from dietary and supplemental sources. Recommend weight-bearing and muscle strengthening exercises for building and maintaining bone density.  Plan  Continue current medications. Recommend updated dexa scan and consider  oral bisphosphonate.    Health Maintenance   Patient is currently controlled on the following medications:    Metronidazole topically - rosacea  We discussed:  diet and exercise extensively. Her rosacea is flaring up likely due to anxiety. A new prescription was sent in to [facility] for metronidazole.   Plan  Continue current medications   Medication Management   Pt uses [facility] for all medications Weekly pill box Pt endorses great compliance  We discussed: Good compliance with medication.   Plan to see Dr. Henrene Pastor and Dr. Cannon Kettle soon for visits to address pain, worsened depression and anxiety.   Update 06/18/2020 - Patient is a candidate for oral bisphosphonate therapy due to last Dexa. Consider repeating dexa scan and adding bisphosphonate if appropriate.   Follow up: 3 months

## 2020-06-19 ENCOUNTER — Other Ambulatory Visit: Payer: Self-pay | Admitting: Legal Medicine

## 2020-06-19 DIAGNOSIS — M8589 Other specified disorders of bone density and structure, multiple sites: Secondary | ICD-10-CM

## 2020-06-23 DIAGNOSIS — R0781 Pleurodynia: Secondary | ICD-10-CM | POA: Diagnosis not present

## 2020-06-23 DIAGNOSIS — S20212A Contusion of left front wall of thorax, initial encounter: Secondary | ICD-10-CM | POA: Diagnosis not present

## 2020-06-24 ENCOUNTER — Other Ambulatory Visit: Payer: Medicare HMO | Admitting: Orthotics

## 2020-06-24 ENCOUNTER — Other Ambulatory Visit: Payer: Self-pay

## 2020-06-26 ENCOUNTER — Telehealth: Payer: Medicare HMO

## 2020-07-17 ENCOUNTER — Other Ambulatory Visit: Payer: Self-pay | Admitting: Legal Medicine

## 2020-07-24 ENCOUNTER — Telehealth: Payer: Medicare HMO

## 2020-07-28 ENCOUNTER — Ambulatory Visit: Payer: Medicare HMO | Admitting: Legal Medicine

## 2020-08-05 ENCOUNTER — Ambulatory Visit: Payer: Medicare HMO | Admitting: Legal Medicine

## 2020-08-11 ENCOUNTER — Encounter: Payer: Self-pay | Admitting: Legal Medicine

## 2020-08-11 ENCOUNTER — Ambulatory Visit (INDEPENDENT_AMBULATORY_CARE_PROVIDER_SITE_OTHER): Payer: Medicare HMO | Admitting: Legal Medicine

## 2020-08-11 ENCOUNTER — Other Ambulatory Visit: Payer: Self-pay

## 2020-08-11 VITALS — BP 124/80 | HR 84 | Temp 97.6°F | Resp 16 | Ht 65.0 in | Wt 177.2 lb

## 2020-08-11 DIAGNOSIS — E890 Postprocedural hypothyroidism: Secondary | ICD-10-CM

## 2020-08-11 DIAGNOSIS — J683 Other acute and subacute respiratory conditions due to chemicals, gases, fumes and vapors: Secondary | ICD-10-CM

## 2020-08-11 DIAGNOSIS — I10 Essential (primary) hypertension: Secondary | ICD-10-CM

## 2020-08-11 DIAGNOSIS — M797 Fibromyalgia: Secondary | ICD-10-CM

## 2020-08-11 DIAGNOSIS — K219 Gastro-esophageal reflux disease without esophagitis: Secondary | ICD-10-CM

## 2020-08-11 DIAGNOSIS — M4310 Spondylolisthesis, site unspecified: Secondary | ICD-10-CM | POA: Diagnosis not present

## 2020-08-11 DIAGNOSIS — C3491 Malignant neoplasm of unspecified part of right bronchus or lung: Secondary | ICD-10-CM | POA: Diagnosis not present

## 2020-08-11 DIAGNOSIS — D849 Immunodeficiency, unspecified: Secondary | ICD-10-CM | POA: Diagnosis not present

## 2020-08-11 DIAGNOSIS — F064 Anxiety disorder due to known physiological condition: Secondary | ICD-10-CM

## 2020-08-11 DIAGNOSIS — D649 Anemia, unspecified: Secondary | ICD-10-CM | POA: Diagnosis not present

## 2020-08-11 MED ORDER — BUPROPION HCL ER (XL) 150 MG PO TB24: 150.0000 mg | ORAL_TABLET | Freq: Every day | ORAL | 2 refills | Status: DC

## 2020-08-11 MED ORDER — TRAMADOL HCL 50 MG PO TABS: 50.0000 mg | ORAL_TABLET | Freq: Four times a day (QID) | ORAL | 2 refills | Status: DC

## 2020-08-11 MED ORDER — PREGABALIN 75 MG PO CAPS: 75.0000 mg | ORAL_CAPSULE | Freq: Two times a day (BID) | ORAL | 3 refills | Status: DC

## 2020-08-11 MED ORDER — ALPRAZOLAM 0.5 MG PO TABS: 0.5000 mg | ORAL_TABLET | Freq: Two times a day (BID) | ORAL | 3 refills | Status: DC | PRN

## 2020-08-11 NOTE — Patient Instructions (Signed)
Myofascial Pain Syndrome and Fibromyalgia Myofascial pain syndrome and fibromyalgia are both pain disorders. This pain may be felt mainly in your muscles.  Myofascial pain syndrome: ? Always has tender points in the muscle that will cause pain when pressed (trigger points). The pain may come and go. ? Usually affects your neck, upper back, and shoulder areas. The pain often radiates into your arms and hands.  Fibromyalgia: ? Has muscle pains and tenderness that come and go. ? Is often associated with fatigue and sleep problems. ? Has trigger points. ? Tends to be long-lasting (chronic), but is not life-threatening. Fibromyalgia and myofascial pain syndrome are not the same. However, they often occur together. If you have both conditions, each can make the other worse. Both are common and can cause enough pain and fatigue to make day-to-day activities difficult. Both can be hard to diagnose because their symptoms are common in many other conditions. What are the causes? The exact causes of these conditions are not known. What increases the risk? You are more likely to develop this condition if:  You have a family history of the condition.  You have certain triggers, such as: ? Spine disorders. ? An injury (trauma) or other physical stressors. ? Being under a lot of stress. ? Medical conditions such as osteoarthritis, rheumatoid arthritis, or lupus. What are the signs or symptoms? Fibromyalgia The main symptom of fibromyalgia is widespread pain and tenderness in your muscles. Pain is sometimes described as stabbing, shooting, or burning. You may also have:  Tingling or numbness.  Sleep problems and fatigue.  Problems with attention and concentration (fibro fog). Other symptoms may include:  Bowel and bladder problems.  Headaches.  Visual problems.  Problems with odors and noises.  Depression or mood changes.  Painful menstrual periods (dysmenorrhea).  Dry skin or  eyes. These symptoms can vary over time. Myofascial pain syndrome Symptoms of myofascial pain syndrome include:  Tight, ropy bands of muscle.  Uncomfortable sensations in muscle areas. These may include aching, cramping, burning, numbness, tingling, and weakness.  Difficulty moving certain parts of the body freely (poor range of motion). How is this diagnosed? This condition may be diagnosed by your symptoms and medical history. You will also have a physical exam. In general:  Fibromyalgia is diagnosed if you have pain, fatigue, and other symptoms for more than 3 months, and symptoms cannot be explained by another condition.  Myofascial pain syndrome is diagnosed if you have trigger points in your muscles, and those trigger points are tender and cause pain elsewhere in your body (referred pain). How is this treated? Treatment for these conditions depends on the type that you have.  For fibromyalgia: ? Pain medicines, such as NSAIDs. ? Medicines for treating depression. ? Medicines for treating seizures. ? Medicines that relax the muscles.  For myofascial pain: ? Pain medicines, such as NSAIDs. ? Cooling and stretching of muscles. ? Trigger point injections. ? Sound wave (ultrasound) treatments to stimulate muscles. Treating these conditions often requires a team of health care providers. These may include:  Your primary care provider.  Physical therapist.  Complementary health care providers, such as massage therapists or acupuncturists.  Psychiatrist for cognitive behavioral therapy. Follow these instructions at home: Medicines  Take over-the-counter and prescription medicines only as told by your health care provider.  Do not drive or use heavy machinery while taking prescription pain medicine.  If you are taking prescription pain medicine, take actions to prevent or treat constipation. Your health care  provider may recommend that you: ? Drink enough fluid to keep  your urine pale yellow. ? Eat foods that are high in fiber, such as fresh fruits and vegetables, whole grains, and beans. ? Limit foods that are high in fat and processed sugars, such as fried or sweet foods. ? Take an over-the-counter or prescription medicine for constipation. Lifestyle   Exercise as directed by your health care provider or physical therapist.  Practice relaxation techniques to control your stress. You may want to try: ? Biofeedback. ? Visual imagery. ? Hypnosis. ? Muscle relaxation. ? Yoga. ? Meditation.  Maintain a healthy lifestyle. This includes eating a healthy diet and getting enough sleep.  Do not use any products that contain nicotine or tobacco, such as cigarettes and e-cigarettes. If you need help quitting, ask your health care provider. General instructions  Talk to your health care provider about complementary treatments, such as acupuncture or massage.  Consider joining a support group with others who are diagnosed with this condition.  Do not do activities that stress or strain your muscles. This includes repetitive motions and heavy lifting.  Keep all follow-up visits as told by your health care provider. This is important. Where to find more information  National Fibromyalgia Association: www.fmaware.[place]: www.arthritis.org  American Chronic Pain Association: www.theacpa.org Contact a health care provider if:  You have new symptoms.  Your symptoms get worse or your pain is severe.  You have side effects from your medicines.  You have trouble sleeping.  Your condition is causing depression or anxiety. Summary  Myofascial pain syndrome and fibromyalgia are pain disorders.  Myofascial pain syndrome has tender points in the muscle that will cause pain when pressed (trigger points). Fibromyalgia also has muscle pains and tenderness that come and go, but this condition is often associated with fatigue and sleep  disturbances.  Fibromyalgia and myofascial pain syndrome are not the same but often occur together, causing pain and fatigue that make day-to-day activities difficult.  Treatment for fibromyalgia includes taking medicines to relax the muscles and medicines for pain, depression, or seizures. Treatment for myofascial pain syndrome includes taking medicines for pain, cooling and stretching of muscles, and injecting medicines into trigger points.  Follow your health care provider's instructions for taking medicines and maintaining a healthy lifestyle. This information is not intended to replace advice given to you by your health care provider. Make sure you discuss any questions you have with your health care provider. Document Revised: 03/30/2019 Document Reviewed: 12/21/2017 Elsevier Patient Education  2020 [employer].

## 2020-08-11 NOTE — Progress Notes (Signed)
Subjective:  Patient ID: Victoria Pena, female    DOB: 04/17/1951  Age: 69 y.o. MRN: [id number]  Chief Complaint  Patient presents with  . Hypertension  . Hyperlipidemia  . Hypothyroidism    HPI: Chronic visit  Patient presents for follow up of hypertension.  Patient tolerating losartan well with side effects.  Patient was diagnosed with hypertension 2010 so has been treated for hypertension for 10 years.Patient is working on maintaining diet and exercise regimen and follows up as directed. Complication include none.  Patient presents with hyperlipidemia.  Compliance with treatment has been good; patient takes medicines as directed, maintains low cholesterol diet, follows up as directed, and maintains exercise regimen.  Patient is using none without problems.  Patient has gastroesophageal reflux symptoms withesophagitis and LTRD.  The symptoms are moderate intensity.  Length of symptoms 10 years.  Medicines include omeprazole.  Complications include none  Chronic fibromyalgia.  With increased pain.  She is on tramadol and Wellbutrin..Pain 8/10. She has back surgery.     Current Outpatient Medications on File Prior to Visit  Medication Sig Dispense Refill  . albuterol (PROVENTIL HFA;VENTOLIN HFA) 108 (90 Base) MCG/ACT inhaler Inhale 2 puffs into the lungs every 4 (four) hours as needed for wheezing or shortness of breath.     Marland Kitchen aspirin 81 MG tablet Take 81 mg by mouth daily.    . calcium carbonate (OS-CAL) 1250 (500 Ca) MG chewable tablet Chew by mouth.    . calcium carbonate (TUMS - DOSED IN MG ELEMENTAL CALCIUM) 500 MG chewable tablet Chew 1 tablet by mouth 2 (two) times daily as needed.     Marland Kitchen ibuprofen (ADVIL,MOTRIN) 200 MG tablet Take 600 mg by mouth 2 (two) times daily as needed for mild pain.    Marland Kitchen levothyroxine (SYNTHROID) 125 MCG tablet TAKE 1 TABLET EVERY DAY 90 tablet 2  . losartan (COZAAR) 50 MG tablet TAKE 1 TABLET(50 MG) BY MOUTH ONCE DAILY 90 tablet 2  . omeprazole  (PRILOSEC) 40 MG capsule TAKE 1 CAPSULE (40 MG) BY ORAL ROUTE ONE TIMES PER DAY BEFORE A MEAL 90 capsule 2  . Spacer/Aero-Holding Chambers (AEROCHAMBER W/FLOWSIGNAL) inhaler Chamber use with Ventolin     No current facility-administered medications on file prior to visit.   Past Medical History:  Diagnosis Date  . Adenocarcinoma of right lung, stage 1 ([place]) 07/26/2016  . Anxiety   . COPD (chronic obstructive pulmonary disease) ([place])    pt denies  . Depression   . Hypertension   . Thyroid disease    Past Surgical History:  Procedure Laterality Date  . ANKLE SURGERY     bilateral  . APPENDECTOMY    . CHOLECYSTECTOMY    . REPLACEMENT TOTAL KNEE Left   . THYROIDECTOMY      Family History  Problem Relation Age of Onset  . Cancer Mother   . Cancer Father   . Cancer Brother    Social History   Socioeconomic History  . Marital status: Widowed    Spouse name: Not on file  . Number of children: Not on file  . Years of education: Not on file  . Highest education level: Not on file  Occupational History  . Not on file  Tobacco Use  . Smoking status: Former Smoker    Packs/day: 1.50    Years: 35.00    Pack years: 52.50    Quit date: 07/27/1999    Years since quitting: 21.0  . Smokeless tobacco: Never Used  Vaping Use  . Vaping Use: Never used  Substance and Sexual Activity  . Alcohol use: No  . Drug use: No  . Sexual activity: Not Currently  Other Topics Concern  . Not on file  Social History Narrative  . Not on file   Social Determinants of Health   Financial Resource Strain:   . Difficulty of Paying Living Expenses: Not on file  Food Insecurity:   . Worried About Charity fundraiser in the Last Year: Not on file  . Ran Out of Food in the Last Year: Not on file  Transportation Needs: No Transportation Needs  . Lack of Transportation (Medical): No  . Lack of Transportation (Non-Medical): No  Physical Activity:   . Days of Exercise per Week: Not on file  . Minutes  of Exercise per Session: Not on file  Stress: Stress Concern Present  . Feeling of Stress : Very much  Social Connections:   . Frequency of Communication with Friends and Family: Not on file  . Frequency of Social Gatherings with Friends and Family: Not on file  . Attends Religious Services: Not on file  . Active Member of Clubs or Organizations: Not on file  . Attends Archivist Meetings: Not on file  . Marital Status: Not on file    Review of Systems  Constitutional: Negative.   HENT: Negative.   Eyes: Negative.   Respiratory: Negative.   Cardiovascular: Negative.   Gastrointestinal: Negative.   Genitourinary: Negative.   Musculoskeletal: Positive for arthralgias and back pain.       Tender points  Neurological: Negative.      Objective:  BP 124/80   Pulse 84   Temp 97.6 F (36.4 C)   Resp 16   Ht 5\' 5"  (1.651 m)   Wt 177 lb 3.2 oz (80.4 kg)   LMP  (LMP Unknown)   SpO2 97%   BMI 29.49 kg/m   BP/Weight 08/11/2020 06/12/7627 02/18/5175  Systolic BP 160 737 106  Diastolic BP 80 84 80  Wt. (Lbs) 177.2 176 172.2  BMI 29.49 28.41 28.66    Physical Exam Vitals reviewed.  Constitutional:      Appearance: Normal appearance. She is normal weight.  HENT:     Head: Normocephalic and atraumatic.     Right Ear: Tympanic membrane, ear canal and external ear normal.     Left Ear: Tympanic membrane, ear canal and external ear normal.     Nose: Nose normal.     Mouth/Throat:     Mouth: Mucous membranes are dry.     Pharynx: Oropharynx is clear.  Cardiovascular:     Rate and Rhythm: Normal rate and regular rhythm.     Pulses: Normal pulses.     Heart sounds: Normal heart sounds.  Pulmonary:     Effort: Pulmonary effort is normal.     Breath sounds: Normal breath sounds.  Abdominal:     General: Abdomen is flat. Bowel sounds are normal.     Palpations: Abdomen is soft.  Musculoskeletal:     Cervical back: Normal range of motion and neck supple.     Comments:  Right back pain to hip area  Skin:      Neurological:     General: No focal deficit present.     Mental Status: She is alert.     Comments: Multiple tender points shoulders and hips, She has cane    Depression screen [facility] 2/9 08/11/2020 03/24/2020 03/19/2020  Decreased  Interest 3 3 3   Down, Depressed, Hopeless 2 2 3   PHQ - 2 Score 5 5 6   Altered sleeping 3 3 3   Tired, decreased energy 2 3 3   Change in appetite 2 2 0  Feeling bad or failure about yourself  0 2 3  Trouble concentrating 0 2 3  Moving slowly or fidgety/restless 0 0 0  Suicidal thoughts 0 0 0  PHQ-9 Score 12 17 18   Difficult doing work/chores Very difficult Very difficult -      Lab Results  Component Value Date   WBC 5.5 03/24/2020   HGB 11.7 03/24/2020   HCT 38.4 03/24/2020   PLT 305 03/24/2020   GLUCOSE 95 03/24/2020   ALT 9 03/24/2020   AST 13 03/24/2020   NA 142 03/24/2020   K 5.1 03/24/2020   CL 106 03/24/2020   CREATININE 0.72 03/24/2020   BUN 17 03/24/2020   CO2 24 03/24/2020   TSH 2.350 03/24/2020   INR 0.95 05/03/2018      Assessment & Plan:   1. Benign essential hypertension An individual hypertension care plan was established and reinforced today.  The patient's status was assessed using clinical findings on exam and labs or diagnostic tests. The patient's success at meeting treatment goals on disease specific evidence-based guidelines and found to be well controlled. SELF MANAGEMENT: The patient and I together assessed ways to personally work towards obtaining the recommended goals. RECOMMENDATIONS: avoid decongestants found in common cold remedies, decrease consumption of alcohol, perform routine monitoring of BP with home BP cuff, exercise, reduction of dietary salt, take medicines as prescribed, try not to miss doses and quit smoking.  Regular exercise and maintaining a healthy weight is needed.  Stress reduction may help. A CLINICAL SUMMARY including written plan identify barriers to care  unique to individual due to social or financial issues.  We attempt to mutually creat solutions for individual and family understanding.  2. Acquired spondylolisthesis - traMADol (ULTRAM) 50 MG tablet; Take 1 tablet (50 mg total) by mouth 4 (four) times daily.  Dispense: 120 tablet; Refill: 2 Patient has chronic back pain and now increasing on right side.  She had surgery and needs to see her neurosurgeon.  We discussed  3. Postoperative hypothyroidism Patient is known to have hypothyroidism  and is 0n treatment with 137mcg levothyroxine.  Patient was diagnosed 10 years ago.  Other treatment includes surgery.  Patient is compliant with medicines and last TSH 6 months ago.  Last TSH was normal.  4. Gastroesophageal reflux disease without esophagitis Plan of care was formulated today.  She is doing fair.  A plan of care was formulated using patient exam, tests and other sources to optimize care using evidence based information.  Recommend no smoking, no eating after supper, avoid fatty foods, elevate Head of bed, avoid tight fitting clothing.  Continue on omeprazole.  5. Reactive airways dysfunction syndrome, unspecified asthma severity, uncomplicated ([place]) This patient has asthma mild and is on albuterol.  Patient is not having a flair.  Chronic medicines include albuterol. Addition new medicines none.  Asthma action plan is in place.   6. Anxiety disorder due to known physiological condition - ALPRAZolam (XANAX) 0.5 MG tablet; Take 1 tablet (0.5 mg total) by mouth 2 (two) times daily as needed for anxiety.  Dispense: 60 tablet; Refill: 3 Patient's depression is controlled somewhat with no medicines.   Anhedonia better.  PHQ 9 was performed score 12. An individual care plan was established or reinforced  today.  The patient's disease status was assessed using clinical findings on exam, labs, and or other diagnostic testing to determine patient's success in meeting treatment goals based on disease  specific evidence-based guidelines and found to be improving Recommendations include no changes  7. Adenocarcinoma of right lung, stage 1 ([facility]) - buPROPion (WELLBUTRIN XL) 150 MG 24 hr tablet; Take 1 tablet (150 mg total) by mouth daily.  Dispense: 90 tablet; Refill: 2 Patient was diagnosed with adenocarcinoma lung, with local excision and chemotherapy.  She has not had a recurrance.  She is a candidate for 3rd covid shot due to immunosuppression  8. Fibromyalgia AN INDIVIDUAL CARE PLAN for fibromyalgia was established and reinforced today.  The patient's status was assessed using clinical findings on exam, labs, and other diagnostic testing. Patient's success at meeting treatment goals based on disease specific evidence-bassed guidelines and found to be in fair control. RECOMMENDATIONS include changing present medicines and treatment. She needs water aerobics, massage therapy and start Lyrica 75mg  tid.  Follow up one month  Meds ordered this encounter  Medications  . ALPRAZolam (XANAX) 0.5 MG tablet    Sig: Take 1 tablet (0.5 mg total) by mouth 2 (two) times daily as needed for anxiety.    Dispense:  60 tablet    Refill:  3  . buPROPion (WELLBUTRIN XL) 150 MG 24 hr tablet    Sig: Take 1 tablet (150 mg total) by mouth daily.    Dispense:  90 tablet    Refill:  2  . traMADol (ULTRAM) 50 MG tablet    Sig: Take 1 tablet (50 mg total) by mouth 4 (four) times daily.    Dispense:  120 tablet    Refill:  2    No orders of the defined types were placed in this encounter.    Follow-up: Return in about 3 months (around 11/11/2020) for fasting.  An After Visit Summary was printed and given to the patient.  [place] [phone]

## 2020-08-12 ENCOUNTER — Ambulatory Visit: Payer: Medicare HMO | Admitting: Legal Medicine

## 2020-08-12 LAB — COMPREHENSIVE METABOLIC PANEL
ALT: 11 IU/L (ref 0–32)
AST: 12 IU/L (ref 0–40)
Albumin/Globulin Ratio: 1.6 (ref 1.2–2.2)
Albumin: 3.9 g/dL (ref 3.8–4.8)
Alkaline Phosphatase: 146 IU/L — ABNORMAL HIGH (ref 48–121)
BUN/Creatinine Ratio: 22 (ref 12–28)
BUN: 16 mg/dL (ref 8–27)
Bilirubin Total: 0.4 mg/dL (ref 0.0–1.2)
CO2: 22 mmol/L (ref 20–29)
Calcium: 9.4 mg/dL (ref 8.7–10.3)
Chloride: 109 mmol/L — ABNORMAL HIGH (ref 96–106)
Creatinine, Ser: 0.74 mg/dL (ref 0.57–1.00)
GFR calc Af Amer: 96 mL/min/{1.73_m2} (ref >59)
GFR calc non Af Amer: 83 mL/min/{1.73_m2} (ref >59)
Globulin, Total: 2.5 g/dL (ref 1.5–4.5)
Glucose: 94 mg/dL (ref 65–99)
Potassium: 4.7 mmol/L (ref 3.5–5.2)
Sodium: 144 mmol/L (ref 134–144)
Total Protein: 6.4 g/dL (ref 6.0–8.5)

## 2020-08-12 LAB — CBC WITH DIFFERENTIAL/PLATELET
Basophils Absolute: 0 10*3/uL (ref 0.0–0.2)
Basos: 1 %
EOS (ABSOLUTE): 0.1 10*3/uL (ref 0.0–0.4)
Eos: 2 %
Hematocrit: 37.2 % (ref 34.0–46.6)
Hemoglobin: 11.4 g/dL (ref 11.1–15.9)
Immature Grans (Abs): 0 10*3/uL (ref 0.0–0.1)
Immature Granulocytes: 0 %
Lymphocytes Absolute: 1.6 10*3/uL (ref 0.7–3.1)
Lymphs: 29 %
MCH: 23.8 pg — ABNORMAL LOW (ref 26.6–33.0)
MCHC: 30.6 g/dL — ABNORMAL LOW (ref 31.5–35.7)
MCV: 78 fL — ABNORMAL LOW (ref 79–97)
Monocytes Absolute: 0.6 10*3/uL (ref 0.1–0.9)
Monocytes: 11 %
Neutrophils Absolute: 3 10*3/uL (ref 1.4–7.0)
Neutrophils: 57 %
Platelets: 363 10*3/uL (ref 150–450)
RBC: 4.78 x10E6/uL (ref 3.77–5.28)
RDW: 15.4 % (ref 11.7–15.4)
WBC: 5.3 10*3/uL (ref 3.4–10.8)

## 2020-08-12 LAB — TSH: TSH: 2.34 u[IU]/mL (ref 0.450–4.500)

## 2020-08-12 NOTE — Progress Notes (Signed)
Add ferritin for mild anemia, kidney and liver tests normal, TSH 2.34 lp

## 2020-08-13 LAB — FERRITIN: Ferritin: 9 ng/mL — ABNORMAL LOW (ref 15–150)

## 2020-08-13 LAB — SPECIMEN STATUS REPORT

## 2020-08-13 NOTE — Progress Notes (Signed)
Iron is low, we need to start oral iron twice a day, if she cannot tolerate , may need to get iron infusions with hematology lp

## 2020-08-23 ENCOUNTER — Other Ambulatory Visit: Payer: Self-pay | Admitting: Legal Medicine

## 2020-09-02 ENCOUNTER — Other Ambulatory Visit: Payer: Self-pay

## 2020-09-02 DIAGNOSIS — M4310 Spondylolisthesis, site unspecified: Secondary | ICD-10-CM

## 2020-09-02 DIAGNOSIS — F064 Anxiety disorder due to known physiological condition: Secondary | ICD-10-CM

## 2020-09-02 MED ORDER — ALPRAZOLAM 0.5 MG PO TABS: 0.5000 mg | ORAL_TABLET | Freq: Two times a day (BID) | ORAL | 3 refills | Status: DC | PRN

## 2020-09-02 MED ORDER — TRAMADOL HCL 50 MG PO TABS: 50.0000 mg | ORAL_TABLET | Freq: Four times a day (QID) | ORAL | 2 refills | Status: DC

## 2020-09-15 ENCOUNTER — Ambulatory Visit: Payer: Medicare HMO | Admitting: Legal Medicine

## 2020-09-22 ENCOUNTER — Ambulatory Visit: Payer: Medicare HMO | Admitting: Legal Medicine

## 2020-09-23 ENCOUNTER — Other Ambulatory Visit: Payer: Self-pay

## 2020-09-23 ENCOUNTER — Encounter: Payer: Self-pay | Admitting: Legal Medicine

## 2020-09-23 ENCOUNTER — Ambulatory Visit (INDEPENDENT_AMBULATORY_CARE_PROVIDER_SITE_OTHER): Payer: Medicare HMO | Admitting: Legal Medicine

## 2020-09-23 VITALS — BP 100/80 | HR 74 | Temp 97.3°F | Resp 16 | Ht 65.0 in | Wt 177.0 lb

## 2020-09-23 DIAGNOSIS — Z23 Encounter for immunization: Secondary | ICD-10-CM

## 2020-09-23 DIAGNOSIS — M797 Fibromyalgia: Secondary | ICD-10-CM

## 2020-09-23 MED ORDER — PREGABALIN 150 MG PO CAPS: 150.0000 mg | ORAL_CAPSULE | Freq: Three times a day (TID) | ORAL | 3 refills | Status: DC

## 2020-09-23 NOTE — Progress Notes (Signed)
Subjective:  Patient ID: Victoria Pena, female    DOB: [birth year]  Age: 69 y.o. MRN: [id number]  Chief Complaint  Patient presents with  . Fibromyalgia    HPI : fibromyalgia improved on lyrica.  She is working and is walking more but unable to use hands. We discussed slow increase in activity to improved function. Current Outpatient Medications on File Prior to Visit  Medication Sig Dispense Refill  . ALPRAZolam (XANAX) 0.5 MG tablet Take 1 tablet (0.5 mg total) by mouth 2 (two) times daily as needed for anxiety. 60 tablet 3  . aspirin 81 MG tablet Take 81 mg by mouth daily.    Marland Kitchen buPROPion (WELLBUTRIN XL) 150 MG 24 hr tablet Take 1 tablet (150 mg total) by mouth daily. 90 tablet 2  . calcium carbonate (TUMS - DOSED IN MG ELEMENTAL CALCIUM) 500 MG chewable tablet Chew 1 tablet by mouth 2 (two) times daily as needed.     Marland Kitchen ibuprofen (ADVIL,MOTRIN) 200 MG tablet Take 600 mg by mouth 2 (two) times daily as needed for mild pain.    Marland Kitchen levothyroxine (SYNTHROID) 125 MCG tablet TAKE 1 TABLET EVERY DAY 90 tablet 2  . losartan (COZAAR) 50 MG tablet TAKE 1 TABLET(50 MG) BY MOUTH ONCE DAILY 90 tablet 2  . omeprazole (PRILOSEC) 40 MG capsule TAKE 1 CAPSULE (40 MG) BY ORAL ROUTE ONE TIMES PER DAY BEFORE A MEAL 90 capsule 2  . traMADol (ULTRAM) 50 MG tablet Take 1 tablet (50 mg total) by mouth 4 (four) times daily. 120 tablet 2   No current facility-administered medications on file prior to visit.   Past Medical History:  Diagnosis Date  . Adenocarcinoma of right lung, stage 1 ([place]) 07/26/2016  . Anxiety   . COPD (chronic obstructive pulmonary disease) ([place])    pt denies  . Depression   . Hypertension   . Thyroid disease    Past Surgical History:  Procedure Laterality Date  . ANKLE SURGERY     bilateral  . APPENDECTOMY    . CHOLECYSTECTOMY    . REPLACEMENT TOTAL KNEE Left   . THYROIDECTOMY      Family History  Problem Relation Age of Onset  . Cancer Mother   . Cancer Father   .  Cancer Brother    Social History   Socioeconomic History  . Marital status: Widowed    Spouse name: Not on file  . Number of children: Not on file  . Years of education: Not on file  . Highest education level: Not on file  Occupational History  . Not on file  Tobacco Use  . Smoking status: Former Smoker    Packs/day: 1.50    Years: 35.00    Pack years: 52.50    Quit date: 07/27/1999    Years since quitting: 21.1  . Smokeless tobacco: Never Used  Vaping Use  . Vaping Use: Never used  Substance and Sexual Activity  . Alcohol use: No  . Drug use: No  . Sexual activity: Not Currently  Other Topics Concern  . Not on file  Social History Narrative  . Not on file   Social Determinants of Health   Financial Resource Strain:   . Difficulty of Paying Living Expenses: Not on file  Food Insecurity:   . Worried About Charity fundraiser in the Last Year: Not on file  . Ran Out of Food in the Last Year: Not on file  Transportation Needs: No Transportation Needs  .  Lack of Transportation (Medical): No  . Lack of Transportation (Non-Medical): No  Physical Activity:   . Days of Exercise per Week: Not on file  . Minutes of Exercise per Session: Not on file  Stress: Stress Concern Present  . Feeling of Stress : Very much  Social Connections:   . Frequency of Communication with Friends and Family: Not on file  . Frequency of Social Gatherings with Friends and Family: Not on file  . Attends Religious Services: Not on file  . Active Member of Clubs or Organizations: Not on file  . Attends Archivist Meetings: Not on file  . Marital Status: Not on file    Review of Systems  Constitutional: Negative.   HENT: Negative.   Respiratory: Negative.   Cardiovascular: Negative.   Genitourinary: Negative.   Musculoskeletal: Positive for arthralgias.  Neurological: Negative.   Hematological: Negative.   Psychiatric/Behavioral: Negative.      Objective:  BP 100/80   Pulse  74   Temp (!) 97.3 F (36.3 C)   Resp 16   Ht 5\' 5"  (1.651 m)   Wt 177 lb (80.3 kg)   LMP  (LMP Unknown)   BMI 29.45 kg/m   BP/Weight 09/23/2020 08/11/2020 2/53/6644  Systolic BP 034 742 595  Diastolic BP 80 80 84  Wt. (Lbs) 177 177.2 176  BMI 29.45 29.49 28.41    Physical Exam Vitals reviewed.  Constitutional:      Appearance: Normal appearance.  HENT:     Head: Normocephalic and atraumatic.     Right Ear: Tympanic membrane normal.     Left Ear: Tympanic membrane normal.     Nose: Nose normal.     Mouth/Throat:     Mouth: Mucous membranes are dry.  Eyes:     Extraocular Movements: Extraocular movements intact.     Conjunctiva/sclera: Conjunctivae normal.     Pupils: Pupils are equal, round, and reactive to light.  Cardiovascular:     Rate and Rhythm: Normal rate and regular rhythm.     Pulses: Normal pulses.     Heart sounds: Normal heart sounds.  Pulmonary:     Effort: Pulmonary effort is normal.     Breath sounds: Normal breath sounds.  Musculoskeletal:        General: Normal range of motion.     Cervical back: Normal range of motion and neck supple.     Comments: Multiple tender points  Skin:    General: Skin is warm.     Capillary Refill: Capillary refill takes less than 2 seconds.  Neurological:     General: No focal deficit present.     Mental Status: She is alert and oriented to person, place, and time.       Lab Results  Component Value Date   WBC 5.3 08/11/2020   HGB 11.4 08/11/2020   HCT 37.2 08/11/2020   PLT 363 08/11/2020   GLUCOSE 94 08/11/2020   ALT 11 08/11/2020   AST 12 08/11/2020   NA 144 08/11/2020   K 4.7 08/11/2020   CL 109 (H) 08/11/2020   CREATININE 0.74 08/11/2020   BUN 16 08/11/2020   CO2 22 08/11/2020   TSH 2.340 08/11/2020   INR 0.95 05/03/2018      Assessment & Plan:   1. Fibromyalgia AN INDIVIDUAL CARE PLAN for fibromyalgia was established and reinforced today.  The patient's status was assessed using clinical  findings on exam, labs, and other diagnostic testing. Patient's success at meeting treatment  goals based on disease specific evidence-bassed guidelines and found to be in improved control. RECOMMENDATIONS include increase dose of lyrica present medicines and treatment.        Follow-up: Return in about 1 month (around 10/24/2020).  An After Visit Summary was printed and given to the patient.  [place] [phone]

## 2020-09-24 IMAGING — MG MM DIGITAL DIAGNOSTIC UNILAT*L* W/ TOMO W/ CAD
4 series · 5 of 12 positions shown · non-contrast
Comparison: Previous exams, most recent March 07, 2019.

CLINICAL DATA: 69-year-old patient with history right upper
lobectomy for stage IB lung adenocarcinoma in July 2015 with
adjuvant chemotherapy. On a recent chest CT dated January 25, 2020,
a newly enlarged single left axillary lymph node measuring 1.0 cm
short axis was described, and further management options included
diagnostic mammography. The patient presents for further evaluation.
Additional history taken from the patient today reveals that she did
have a left arm COVID vaccination on January 14, 2020 (her first
dose). She has not yet had her second dose.

[L MLO synth-2D]
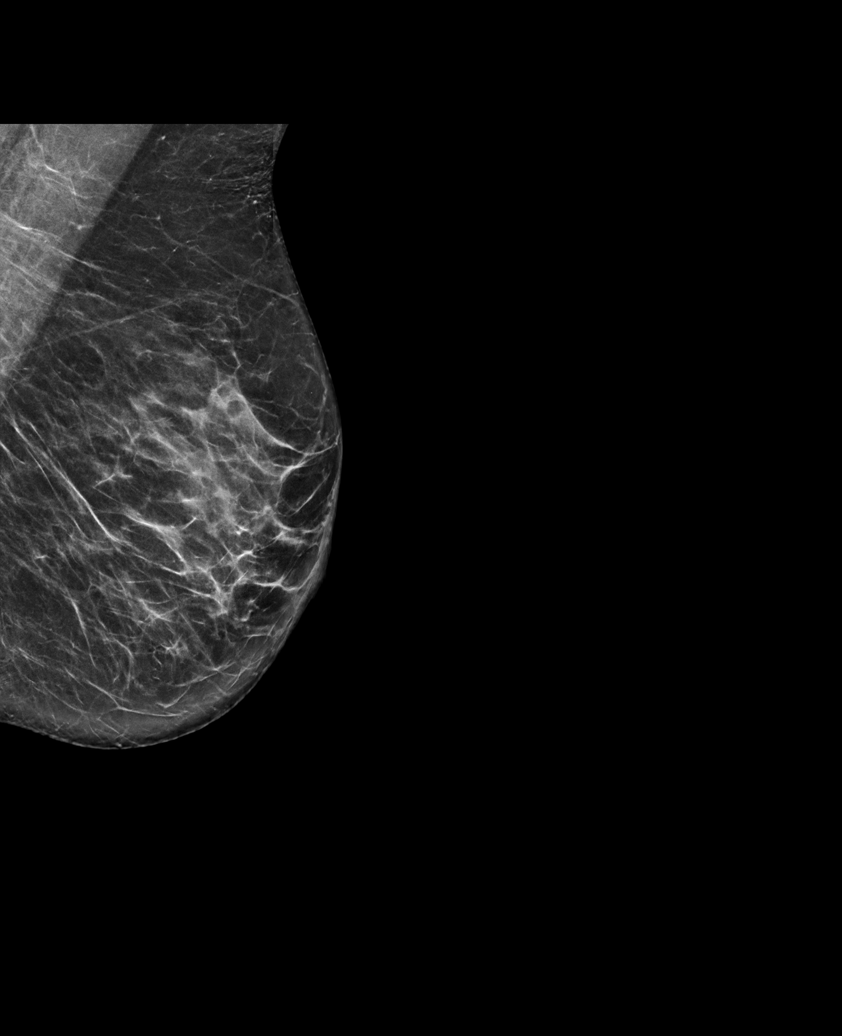

[L CC synth-2D]
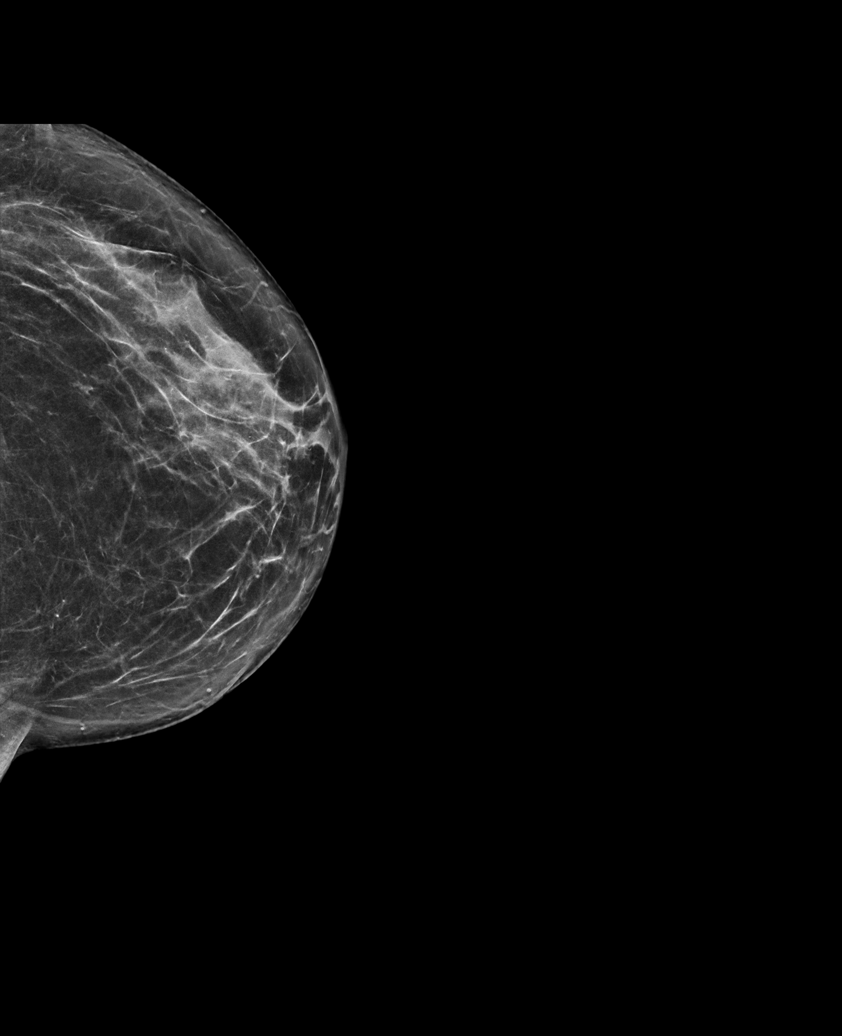

[L CC tomo · 2 of 71 frames shown]
[frame 23/71]
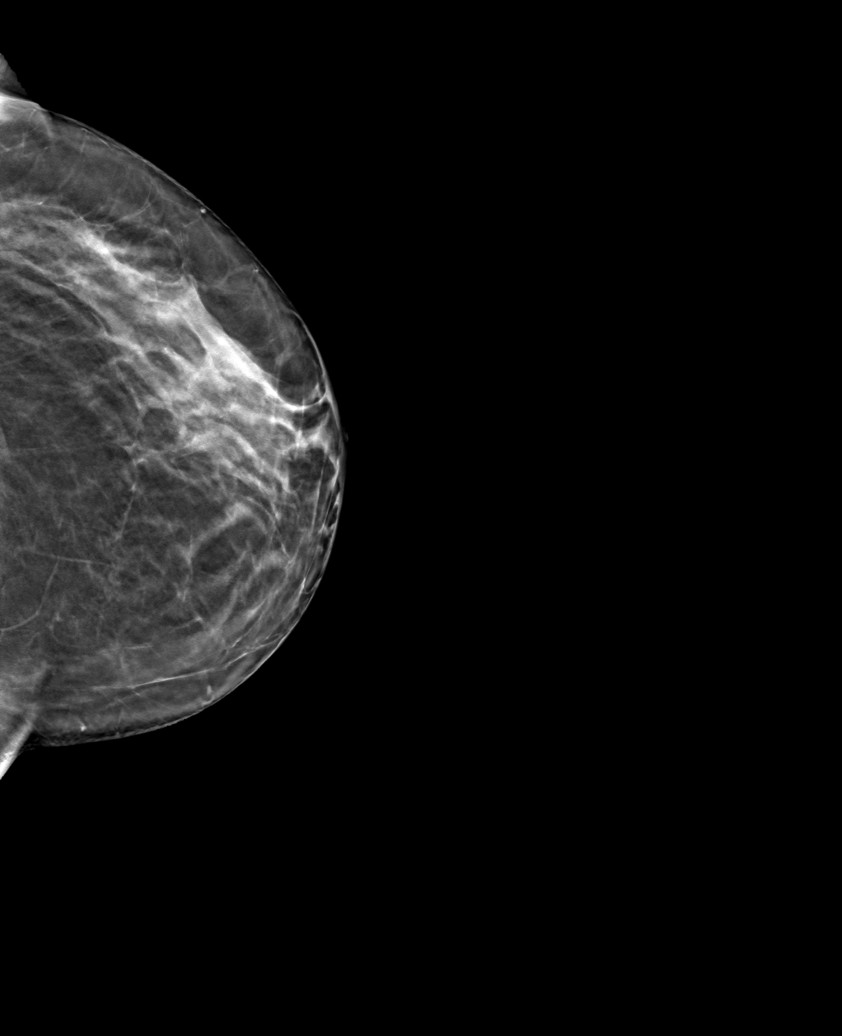
[frame 36/71]
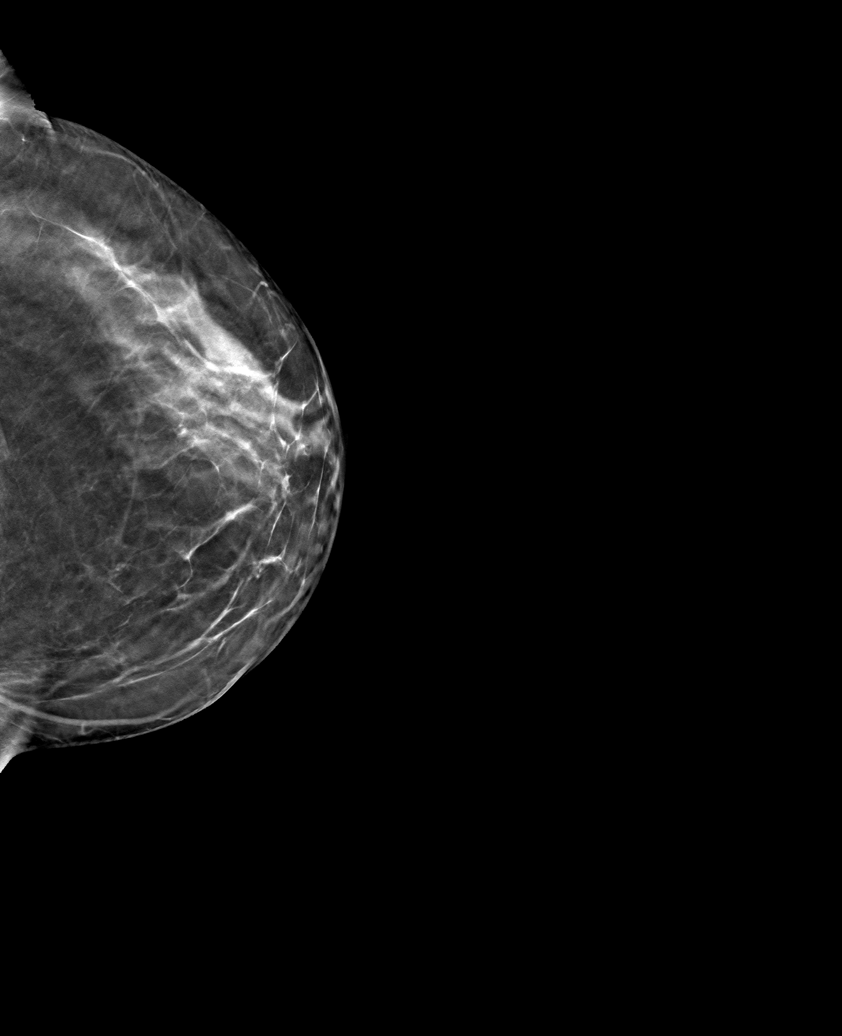

[L MLO tomo · tomo slice 36/71.0]
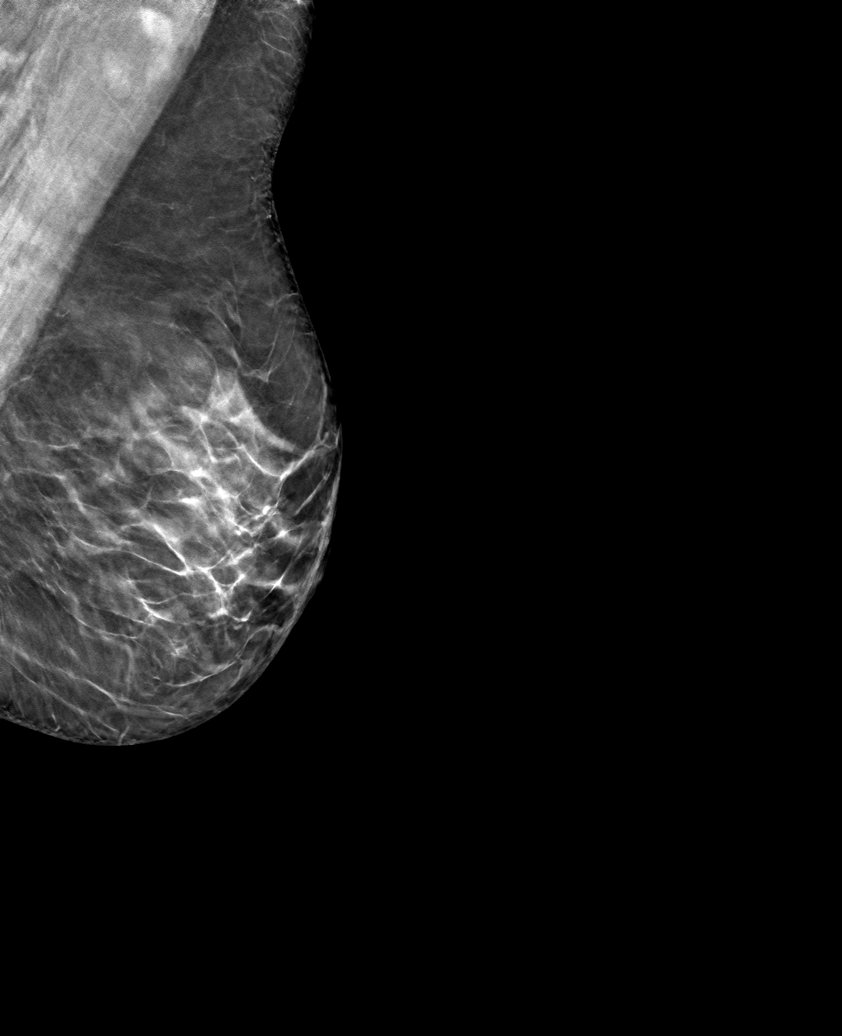

[5 of 12 positions shown; findings below may reference images not displayed]

The patient has a personal history of right breast stereotactic
biopsy July 2018 showing flat epithelial atypia with
calcifications, fibrocystic change with adenosis and calcifications,
for which she had a subsequent excisional biopsy. Her last mammogram
was a screening mammogram on March 08, 2019 at [facility]
showing expected postsurgical changes in the right breast and no
findings suspicious for malignancy in either breast.

EXAM:
DIGITAL DIAGNOSTIC LEFT MAMMOGRAM WITH CAD AND TOMO

ULTRASOUND LEFT BREAST
ACR Breast Density Category c: The breast tissue is heterogeneously
dense, which may obscure small masses.
FINDINGS: No mass, distortion, or suspicious microcalcification is identified
in the left breast to suggest malignancy. The imaged portion of the
left axilla shows normal appearing axillary lymph nodes.

Mammographic images were processed with CAD.

Targeted ultrasound is performed, showing a 1.4 x 0.8 x 1.3 cm lymph
node with mild cortical thickening up to 3.7 to 3.9 mm. This is the
largest lymph node seen in the left axilla.
IMPRESSION: Mildly prominent left axillary lymph node. By imaging appearance
this could be reactive or pathologic. The patient has a personal
history of right lung cancer which has been treated. She had a left
arm Q2M8H-K4 vaccination on January 14, 2020.

RECOMMENDATION:
We discussed the options of a short-term follow-up ultrasound in 2
months given the possibility that this could be reactive lymph node
related to the Q2M8H-K4 vaccine. However, the patient is due for her
second Q2M8H-K4 vaccine and in addition would prefer attempt at
ultrasound-guided biopsy for pathologic evaluation rather than
follow-up.

Additionally, the patient is due for her annual bilateral mammogram
on her after March 07, 2019 and plans to schedule this at as per
Health.

I have discussed the findings and recommendations with the patient.
If applicable, a reminder letter will be sent to the patient
regarding the next appointment.

BI-RADS CATEGORY  4: Suspicious.

## 2020-09-24 IMAGING — US US AXILLARY NODE CORE BIOPSY LEFT
1 series · 12 of 13 positions shown · non-contrast
Comparison: Previous exam(s).
COMPARISON: Previous exam(s).

Addendum:
CLINICAL DATA: Ultrasound-guided core needle biopsy is being
performed to evaluate a mildly prominent left axillary lymph node
with cortical thickening ranging from 3.7 to 3.9 mm. See diagnostic
mammogram and left axillary ultrasound report from 02/25/2020.

EXAM:
US AXILLARY NODE CORE BIOPSY LEFT

[Series 1: us axillary node core biopsy left · 0.07mm/px · 12 of 13 slices shown]
[im 1/13]
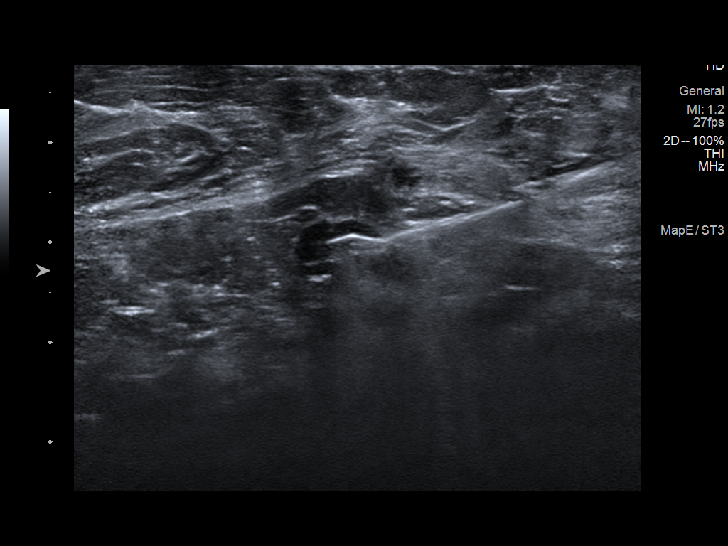
[im 2/13]
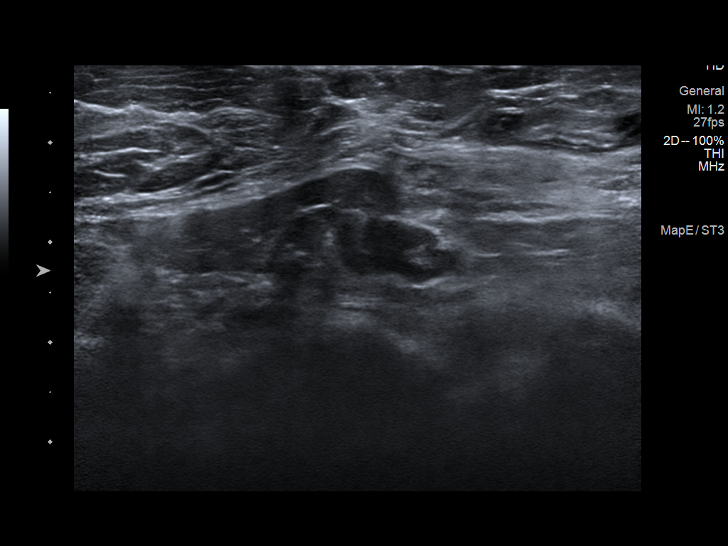
[im 3/13]
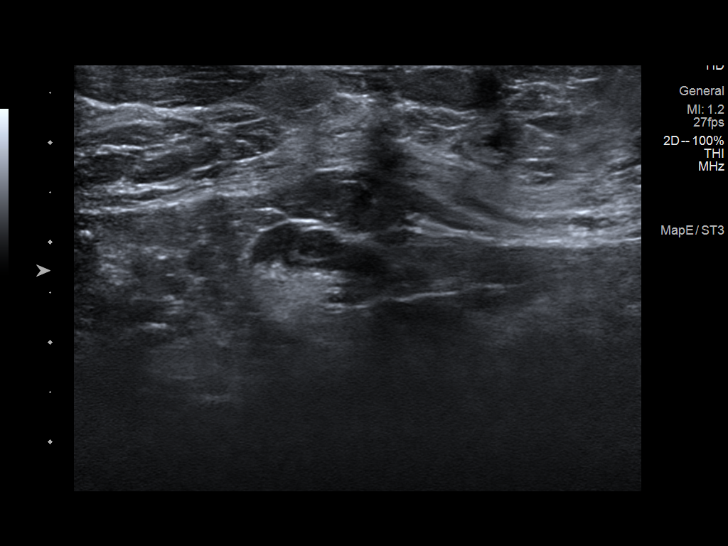
[im 4/13]
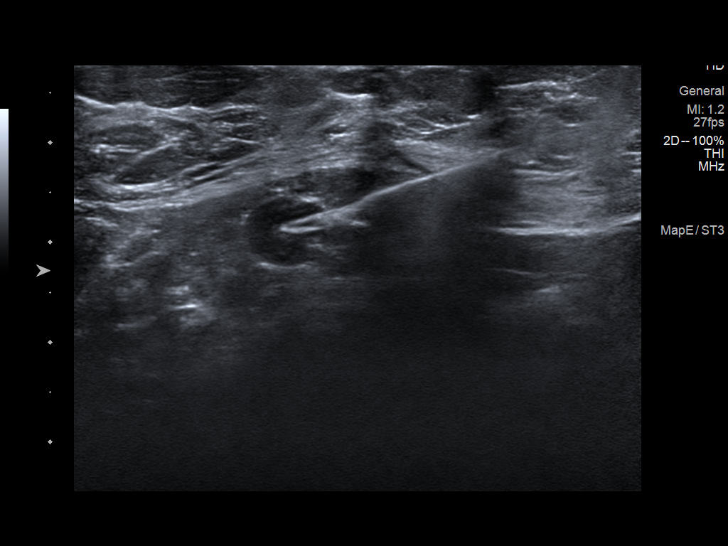
[im 5/13]
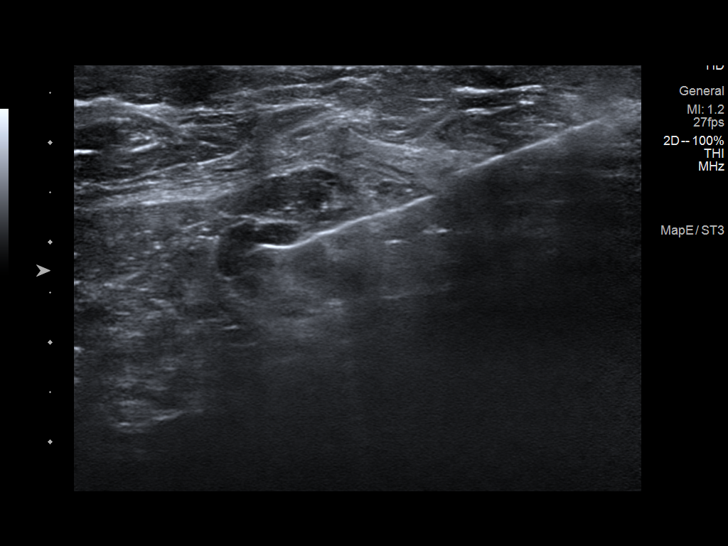
[im 6/13]
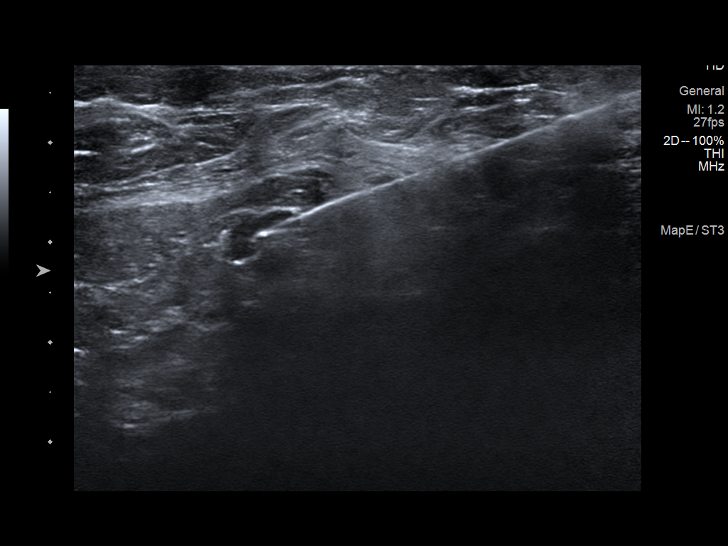
[im 8/13]
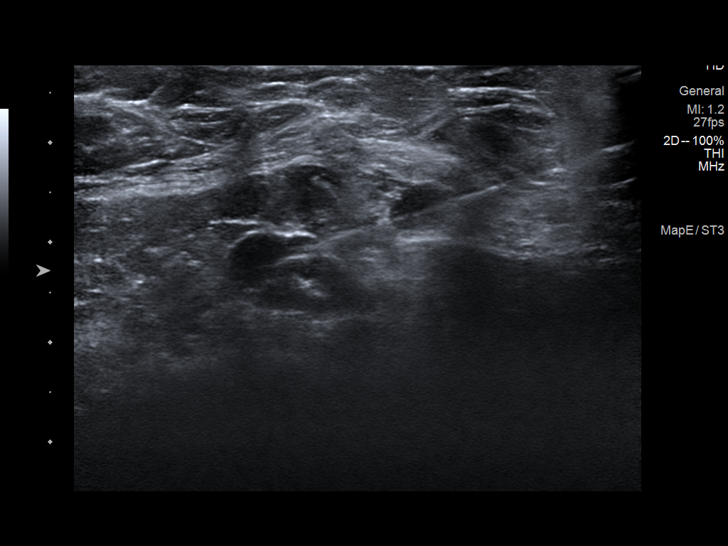
[im 9/13]
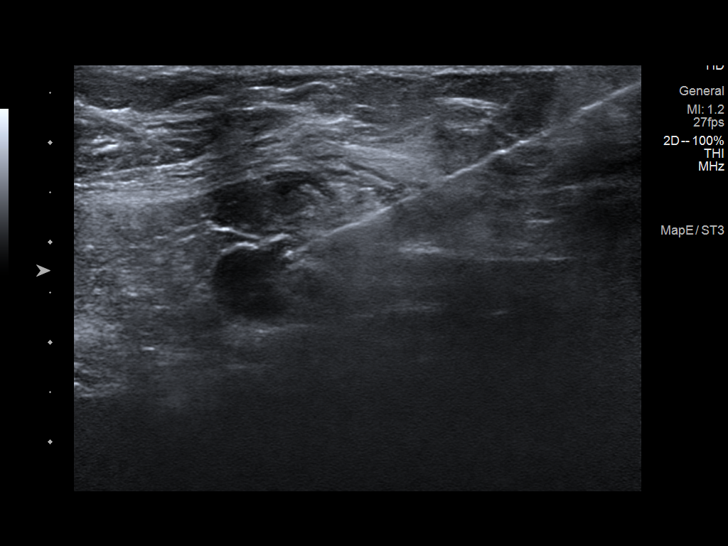
[im 10/13]
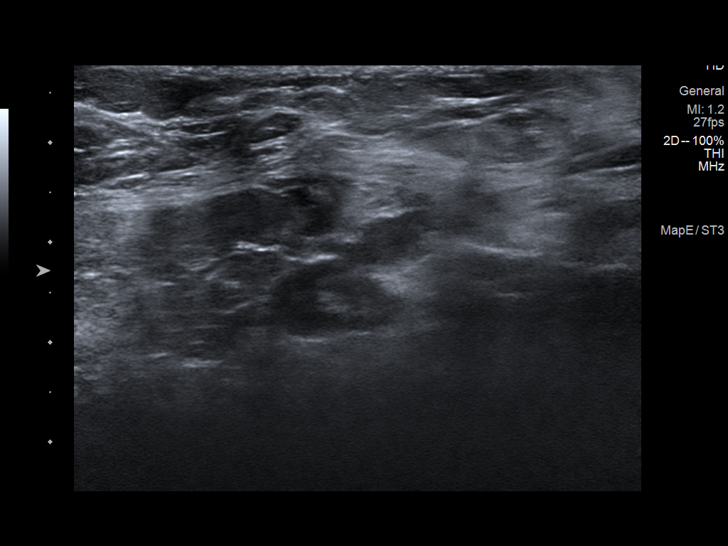
[im 11/13]
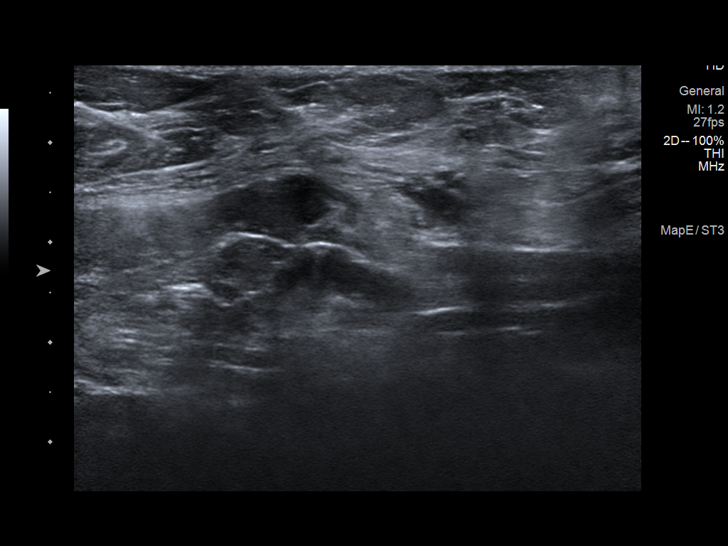
[im 12/13]
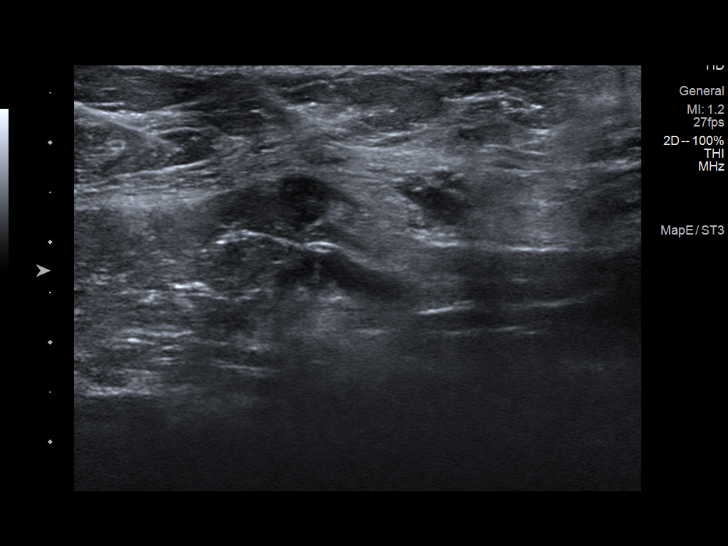
[im 13/13]
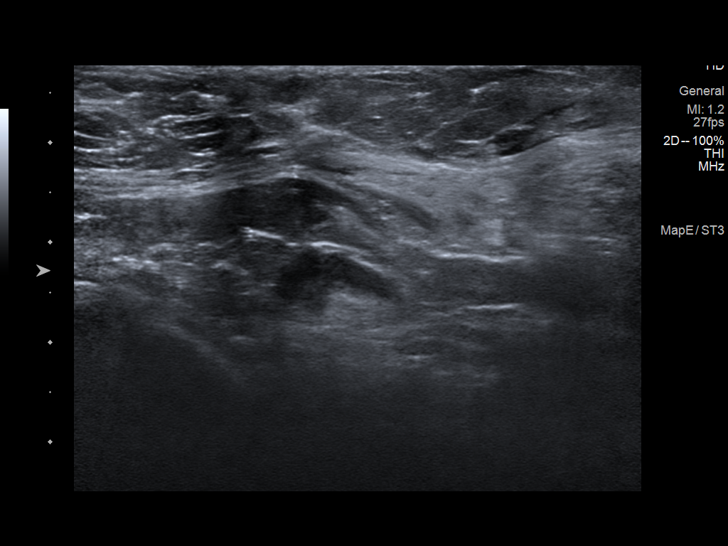

[12 of 13 positions shown; findings below may reference images not displayed]



Using sterile technique and 1% Lidocaine as local anesthetic, under
direct ultrasound visualization, a 14 gauge Moniz device was
used to perform biopsy of an axillary lymph node in the inferior
left axilla using a lateral approach. At the conclusion of the
procedure HydroMARK tissue marker clip was deployed into the biopsy
cavity. Tissue samples sent to pathology in formalin and saline.
Follow up 2 view mammogram was performed and dictated separately.
IMPRESSION: Ultrasound guided biopsy of left axillary lymph node. No apparent
complications.

ADDENDUM:
Pathology revealed FRAGMENTS OF REACTIVE LYMPH NODE of the LEFT
axilla. This was found to be concordant by Dr. Marisha Romain.

Pathology results were discussed with the patient by telephone. The
patient reported doing well after the biopsy with tenderness at the
site. Post biopsy instructions and care were reviewed and questions
were answered. The patient was encouraged to call The [REDACTED]

The patient was instructed to return for annual screening
mammography at [facility] and informed a reminder notice would
be sent regarding this appointment.

Pathology results reported by Bani Ransom RN on 02/26/2020.



Using sterile technique and 1% Lidocaine as local anesthetic, under
direct ultrasound visualization, a 14 gauge Moniz device was
used to perform biopsy of an axillary lymph node in the inferior
left axilla using a lateral approach. At the conclusion of the
procedure HydroMARK tissue marker clip was deployed into the biopsy
cavity. Tissue samples sent to pathology in formalin and saline.
Follow up 2 view mammogram was performed and dictated separately.
IMPRESSION: Ultrasound guided biopsy of left axillary lymph node. No apparent
complications.

## 2020-09-29 ENCOUNTER — Telehealth: Payer: Self-pay

## 2020-09-29 NOTE — Telephone Encounter (Signed)
Pt called in about her Lyrica 150mg  that shes is suppose to take 3 times a day. Pt wants to know if the dosage could be too much or if she could take it less. Pt states she feels really shaky and like shes in a fog. Please advise. Lucila Maine, CMA

## 2020-09-29 NOTE — Telephone Encounter (Signed)
Patient was informed.

## 2020-09-29 NOTE — Telephone Encounter (Signed)
Done! lp

## 2020-09-30 ENCOUNTER — Telehealth: Payer: Self-pay

## 2020-09-30 NOTE — Chronic Care Management (AMB) (Signed)
Chronic Care Management Pharmacy Assistant   Name: Victoria Pena  MRN: [id number] DOB: 1951/07/09  Reason for Encounter: Disease State/ Hypertension Adherence Call.  Patient Questions:  1.  Have you seen any other providers since your last visit? Yes, OV- 06/23/2020 Dr. Wilford Corner ([place]), Urgent Care - 06/23/2020 Dr. Zenon Mayo, OV - 08/11/2020 Dr. Reinaldo Meeker (PCP), OV- 09/23/2020 Dr. Reinaldo Meeker (PCP).    2.  Any changes in your medicines or health? Yes, OV- 09/23/20 Dr. Henrene Pastor (PCP)  patient was recommended to increase dose of lyrica present medicines and treatment. Return visit to follow up in one mo/nth. OV-08/11/20 Dr. Henrene Pastor (PCP) added Tramadol for chronic back pain.   PCP : Lillard Anes, MD  Allergies:   Allergies  Allergen Reactions   Dextromethorphan Hbr Shortness Of Breath   Keflex [Cephalexin] Shortness Of Breath    Hard time breathing   Robaxin [Methocarbamol] Nausea Only    Stomach pain . Could not eat   Bactrim [Sulfamethoxazole-Trimethoprim] Itching   Hydroxyzine Other (See Comments) and Swelling    Numbness in lips   Prednisone & Diphenhydramine Other (See Comments)    Face flushed, heart racing   Ciprofloxacin    Dextromethorphan Polistirex Er    Meloxicam Other (See Comments)    Headache, body aches   Nitrofurantoin    Diphenhydramine Hcl Palpitations   Moxifloxacin Rash   Prednisone Other (See Comments) and Palpitations    "flushing"     Medications: Outpatient Encounter Medications as of 09/30/2020  Medication Sig   ALPRAZolam (XANAX) 0.5 MG tablet Take 1 tablet (0.5 mg total) by mouth 2 (two) times daily as needed for anxiety.   aspirin 81 MG tablet Take 81 mg by mouth daily.   buPROPion (WELLBUTRIN XL) 150 MG 24 hr tablet Take 1 tablet (150 mg total) by mouth daily.   calcium carbonate (TUMS - DOSED IN MG ELEMENTAL CALCIUM) 500 MG chewable tablet Chew 1 tablet by mouth 2 (two) times daily as needed.      ibuprofen (ADVIL,MOTRIN) 200 MG tablet Take 600 mg by mouth 2 (two) times daily as needed for mild pain.   levothyroxine (SYNTHROID) 125 MCG tablet TAKE 1 TABLET EVERY DAY   losartan (COZAAR) 50 MG tablet TAKE 1 TABLET(50 MG) BY MOUTH ONCE DAILY   omeprazole (PRILOSEC) 40 MG capsule TAKE 1 CAPSULE (40 MG) BY ORAL ROUTE ONE TIMES PER DAY BEFORE A MEAL   pregabalin (LYRICA) 150 MG capsule Take 1 capsule (150 mg total) by mouth 3 (three) times daily.   traMADol (ULTRAM) 50 MG tablet Take 1 tablet (50 mg total) by mouth 4 (four) times daily.   No facility-administered encounter medications on file as of 09/30/2020.    Current Diagnosis: Patient Active Problem List   Diagnosis Date Noted   Cerumen impaction 06/04/2020   Current mild episode of major depressive disorder ([place]) 03/24/2020   Gastroesophageal reflux disease without esophagitis 03/24/2020   Fibromyalgia 03/24/2020   Arthropathy of lumbar facet joint 04/11/2019   S/P lumbar spinal fusion 05/11/2018   Anxiety 12/27/2016   Benign essential hypertension 12/27/2016   Depression 12/27/2016   Graves disease 12/27/2016   Palpitations 12/27/2016   Adenocarcinoma of right lung, stage 1 ([place]) 07/26/2016   Reactive airways dysfunction syndrome, unspecified asthma severity, uncomplicated ([place]) 09/98/3382   S/P lobectomy of lung 07/26/2015   HLD (hyperlipidemia) 07/22/2015   Shortness of breath 06/10/2015   Acquired spondylolisthesis 05/12/2015   Postoperative hypothyroidism 01/17/2015   Reviewed  chart prior to disease state call. Spoke with patient regarding BP  Recent Office Vitals: BP Readings from Last 3 Encounters:  09/23/20 100/80  08/11/20 124/80  06/04/20 124/84   Pulse Readings from Last 3 Encounters:  09/23/20 74  08/11/20 84  06/04/20 84    Wt Readings from Last 3 Encounters:  09/23/20 177 lb (80.3 kg)  08/11/20 177 lb 3.2 oz (80.4 kg)  06/04/20 176 lb (79.8 kg)     Kidney Function Lab  Results  Component Value Date/Time   CREATININE 0.74 08/11/2020 10:52 AM   CREATININE 0.72 03/24/2020 11:24 AM   CREATININE 0.77 01/25/2020 01:45 PM   CREATININE 0.75 01/24/2019 10:46 AM   CREATININE 0.8 11/22/2017 02:51 PM   CREATININE 0.8 05/24/2017 09:34 AM   GFRNONAA 83 08/11/2020 10:52 AM   GFRNONAA >60 01/25/2020 01:45 PM   GFRAA 96 08/11/2020 10:52 AM   GFRAA >60 01/25/2020 01:45 PM    BMP Latest Ref Rng & Units 08/11/2020 03/24/2020 01/25/2020  Glucose 65 - 99 mg/dL 94 95 101(H)  BUN 8 - 27 mg/dL 16 17 21   Creatinine 0.57 - 1.00 mg/dL 0.74 0.72 0.77  BUN/Creat Ratio 12 - 28 22 24  -  Sodium 134 - 144 mmol/L 144 142 139  Potassium 3.5 - 5.2 mmol/L 4.7 5.1 4.3  Chloride 96 - 106 mmol/L 109(H) 106 106  CO2 20 - 29 mmol/L 22 24 27   Calcium 8.7 - 10.3 mg/dL 9.4 9.8 9.2     Current antihypertensive regimen:  Losartan 50 mg - once daily   How often are you checking your Blood Pressure? Patient states that she checks them when she's feeling funny when her body tells her.    Current home BP readings: 110/70.   What recent interventions/DTPs have been made by any provider to improve Blood Pressure control since last CPP Visit: No.   Any recent hospitalizations or ED visits since last visit with CPP? No.   What diet changes have been made to improve Blood Pressure Control?  o Patient states that she drinks lots of water, no salt and eats lots of vegetables.   What exercise is being done to improve your Blood Pressure Control?            Patient states she is slowly being active by walking. The Losartan is helping improve physical activity. PCP, Dr. Henrene Pastor recommended that she takes activity slow to not aggravate Fibromyalgia.  Adherence Review: Is the patient currently on ACE/ARB medication? Yes, Losartan 50 mg once daily. Does the patient have >5 day gap between last estimated fill dates? No   Goals Addressed            This Visit's Progress    Pharmacy Care Plan    On track    CARE PLAN ENTRY (see longitudinal plan of care for additional care plan information)  Current Barriers:   Chronic Disease Management support, education, and care coordination needs related to Hypertension, GERD, Depression, and Anxiety   Hypertension BP Readings from Last 3 Encounters:  06/04/20 124/84  03/24/20 140/80  01/28/20 137/90    Pharmacist Clinical Goal(s): o Over the next 90 days, patient will work with PharmD and providers to maintain BP goal <140/90  Current regimen:  o Losartan 50 mg daily  Interventions: o Recommend resume walking after pain resolved with orthotics.   Patient self care activities - Over the next 90 days, patient will: o Check BP as needed, document, and provide at future appointments o  Ensure daily salt intake < 2300 mg/day  GERD  Pharmacist Clinical Goal(s) o Over the next 90 days, patient will work with PharmD and providers to manage symptoms of acid reflux.   Current regimen:  o Omeprazole 40 mg daily, Tums prn   Interventions: o Recommend continuing to manage symptoms with small meals.   Patient self care activities - Over the next 90 days, patient will: o Recommend continuing medications as prescribed.   Medication management  Pharmacist Clinical Goal(s): o Over the next 90 days, patient will work with PharmD and providers to maintain optimal medication adherence  Current pharmacy: [facility]  Interventions o Comprehensive medication review performed. o Continue current medication management strategy  Patient self care activities - Over the next 90 days, patient will: o Focus on medication adherence by continuing to use pill box.  o Take medications as prescribed o Report any questions or concerns to PharmD and/or provider(s)  Please see past updates related to this goal by clicking on the "Past Updates" button in the selected goal         Follow-Up:  Pharmacist [place] CPP.  Notified.  [place] Pharmacist Assistant  [phone]

## 2020-10-07 ENCOUNTER — Telehealth: Payer: Self-pay

## 2020-10-07 ENCOUNTER — Other Ambulatory Visit: Payer: Self-pay

## 2020-10-07 DIAGNOSIS — L600 Ingrowing nail: Secondary | ICD-10-CM

## 2020-10-07 NOTE — Telephone Encounter (Signed)
Dr. Henrene Pastor please advise.  Pt is asking for a referral per her Medicare to [facility] in [place] for a ingrown toenail that is growing/curling back into skin. Pt said she didn't want to come in an bother you just to be referred.

## 2020-10-07 NOTE — Progress Notes (Signed)
amb  

## 2020-10-07 NOTE — Telephone Encounter (Signed)
I put the order in for referral to podiatry.

## 2020-10-07 NOTE — Telephone Encounter (Signed)
Can refer her to podiatry lp

## 2020-10-28 ENCOUNTER — Ambulatory Visit: Payer: Medicare HMO | Admitting: Sports Medicine

## 2020-10-29 ENCOUNTER — Other Ambulatory Visit: Payer: Self-pay

## 2020-10-29 ENCOUNTER — Ambulatory Visit: Payer: Medicare HMO

## 2020-10-29 DIAGNOSIS — I10 Essential (primary) hypertension: Secondary | ICD-10-CM

## 2020-10-29 DIAGNOSIS — K219 Gastro-esophageal reflux disease without esophagitis: Secondary | ICD-10-CM

## 2020-10-29 NOTE — Chronic Care Management (AMB) (Signed)
Chronic Care Management Pharmacy  Name: Victoria Pena  MRN: [id number] DOB: July 12, 1951  Chief Complaint/ HPI  Victoria Pena,  69 y.o. , female presents for their Follow-Up CCM visit with the clinical pharmacist via telephone due to COVID-19 Pandemic.  PCP : Lillard Anes, MD  Their chronic conditions include: HTN, Graves Disease, Anxiety, Depression, Palpitations, Adenocarcinoma of right lung, cough, GERD, HLD.   Office Visits: 09/23/2020 - increase Lyrica 150 mg tid.  08/11/2020 - low iron. Begin iron twice daily if tolerated. May need iron infusions with hematology. Lyrica 75 mg tid.   Consult Visit: 04/21/2020 - normal mammo gram 04/15/2020 - pain management - trial of hydrocodone for pain.  04/09/2020 - podiatry - tendonitis/foot pain. Injection to tendon to reduce pain. Referred for orthotics. Use tylenol/tramadol/aspercream as needed.  01/28/2020 - Oncology visit. MD recommended a lymph node biopsy to rule out malignancy.  09/19/2019- seen by Podiatry for tendonitis. Recommended ice, rest, aspercream, tylenol and tramadol.  Medications: Outpatient Encounter Medications as of 10/29/2020  Medication Sig  . ALPRAZolam (XANAX) 0.5 MG tablet Take 1 tablet (0.5 mg total) by mouth 2 (two) times daily as needed for anxiety.  Marland Kitchen buPROPion (WELLBUTRIN XL) 150 MG 24 hr tablet Take 1 tablet (150 mg total) by mouth daily.  . calcium carbonate (TUMS - DOSED IN MG ELEMENTAL CALCIUM) 500 MG chewable tablet Chew 1 tablet by mouth 2 (two) times daily as needed.   Marland Kitchen ibuprofen (ADVIL,MOTRIN) 200 MG tablet Take 600 mg by mouth 2 (two) times daily as needed for mild pain.  Marland Kitchen levothyroxine (SYNTHROID) 125 MCG tablet TAKE 1 TABLET EVERY DAY  . losartan (COZAAR) 50 MG tablet TAKE 1 TABLET(50 MG) BY MOUTH ONCE DAILY  . omeprazole (PRILOSEC) 40 MG capsule TAKE 1 CAPSULE (40 MG) BY ORAL ROUTE ONE TIMES PER DAY BEFORE A MEAL  . pregabalin (LYRICA) 150 MG capsule Take 1 capsule (150 mg  total) by mouth 3 (three) times daily.  . traMADol (ULTRAM) 50 MG tablet Take 1 tablet (50 mg total) by mouth 4 (four) times daily.  Marland Kitchen aspirin 81 MG tablet Take 81 mg by mouth daily.   No facility-administered encounter medications on file as of 10/29/2020.   SDOH Screenings   Alcohol Screen:   . Last Alcohol Screening Score (AUDIT): Not on file  Depression (PHQ2-9): Medium Risk  . PHQ-2 Score: 12  Financial Resource Strain:   . Difficulty of Paying Living Expenses: Not on file  Food Insecurity:   . Worried About Charity fundraiser in the Last Year: Not on file  . Ran Out of Food in the Last Year: Not on file  Housing:   . Last Housing Risk Score: Not on file  Physical Activity:   . Days of Exercise per Week: Not on file  . Minutes of Exercise per Session: Not on file  Social Connections:   . Frequency of Communication with Friends and Family: Not on file  . Frequency of Social Gatherings with Friends and Family: Not on file  . Attends Religious Services: Not on file  . Active Member of Clubs or Organizations: Not on file  . Attends Archivist Meetings: Not on file  . Marital Status: Not on file  Stress: Stress Concern Present  . Feeling of Stress : Very much  Tobacco Use: Medium Risk  . Smoking Tobacco Use: Former Smoker  . Smokeless Tobacco Use: Never Used  Transportation Needs: No Transportation Needs  . Lack of  Transportation (Medical): No  . Lack of Transportation (Non-Medical): No   Allergies  Allergen Reactions  . Dextromethorphan Hbr Shortness Of Breath  . Keflex [Cephalexin] Shortness Of Breath    Hard time breathing  . Robaxin [Methocarbamol] Nausea Only    Stomach pain . Could not eat  . Bactrim [Sulfamethoxazole-Trimethoprim] Itching  . Hydroxyzine Other (See Comments) and Swelling    Numbness in lips  . Prednisone & Diphenhydramine Other (See Comments)    Face flushed, heart racing  . Ciprofloxacin   . Dextromethorphan Polistirex Er   .  Meloxicam Other (See Comments)    Headache, body aches  . Nitrofurantoin   . Diphenhydramine Hcl Palpitations  . Moxifloxacin Rash  . Prednisone Other (See Comments) and Palpitations    "flushing"     Current Diagnosis/Assessment:  Goals Addressed            This Visit's Progress   . Pharmacy Care Plan       CARE PLAN ENTRY (see longitudinal plan of care for additional care plan information)  Current Barriers:  . Chronic Disease Management support, education, and care coordination needs related to Hypertension, GERD, Depression, and Anxiety   Hypertension BP Readings from Last 3 Encounters:  06/04/20 124/84  03/24/20 140/80  01/28/20 137/90   . Pharmacist Clinical Goal(s): o Over the next 90 days, patient will work with PharmD and providers to maintain BP goal <140/90 . Current regimen:  o Losartan 50 mg daily . Interventions: o Continue daily standing exercises.  . Patient self care activities - Over the next 90 days, patient will: o Check BP as needed, document, and provide at future appointments o Ensure daily salt intake < 2300 mg/day  GERD . Pharmacist Clinical Goal(s) o Over the next 90 days, patient will work with PharmD and providers to manage symptoms of acid reflux.  . Current regimen:  o Omeprazole 40 mg daily o Tums prn  . Interventions: o Recommend continuing to manage symptoms with small meals.  o Discussed how exercise has improved symptoms by reducing midsection weight.  . Patient self care activities - Over the next 90 days, patient will: o Recommend continuing medications as prescribed.   Medication management . Pharmacist Clinical Goal(s): o Over the next 90 days, patient will work with PharmD and providers to maintain optimal medication adherence . Current pharmacy: [facility] . Interventions o Comprehensive medication review performed. o Continue current medication management strategy . Patient self care activities - Over the  next 90 days, patient will: o Focus on medication adherence by continuing to use pill box.  o Take medications as prescribed o Report any questions or concerns to PharmD and/or provider(s)  Please see past updates related to this goal by clicking on the "Past Updates" button in the selected goal         Hypertension   Office blood pressures are  BP Readings from Last 3 Encounters:  09/23/20 100/80  08/11/20 124/80  06/04/20 124/84    Patient has failed these meds in the past: non listed Patient is currently controlled on the following medications:   Losartan 50 mg daily Patient checks BP at home when feeling symptomatic  Patient home BP readings are ranging: 100/80  We discussed diet and exercise extensively. Reports blood pressure improved since pain controlled better.   Adherence 100% per fill history.  Plan  Continue current medications     and  Graves Disease:    Lab Results  Component Value Date/Time  TSH 2.340 08/11/2020 10:52 AM   TSH 2.350 03/24/2020 11:24 AM   FREET4 1.41 03/24/2020 11:24 AM   Patient has failed these meds in past: n/a Patient is currently controlled on the following medications:   levothyroxine 175 mcg  We discussed:  Patient is followed by opthamology due to limitations with Grave disease. She reports that she has limited eye mobility but has learned to compensate. Patient reports stable at this time.   Plan  Continue current medications.   Pain/Arthritis:   Patient has failed these meds in past: Tylenol, Methocarbamol,Oxycodone Patient is currently controlled on the following medications:   Tramadol 50 mg qid prn   Lyrica 150 mg tid  Ibuprofen prn   We discussed:  Patient reports pain and sleep improved with Lyrica. She is really pleased. The pain and burning had made walking difficult before beginning Lyrica. Has been completing some stretches and standing exercises at home for 3 weeks now to strengthen core.     Patient reports struggling to get out of bathtub safely. Discussed installing shower bars and options to avoid falls. Patient does not prefer to shower and wants to continue bathing safely.   Plan  Continue current medications.    GERD   Patient has failed these meds in past: protonix Patient is currently controlled on the following medications:   Omeprazole 40 mg daily before a meal  Tums bid prn  We discussed:  diet and exercise extensively. Patient working to increase exercise to reduce midsection weight gain. Reports symptoms increased after weight gain. Working to avoid trigger foods and lose the midsection weight.   Plan  Continue current medications   Anxiety/Depression   Patient has failed these meds in past: effexor, lexapro Patient is currently uncontrolled on the following medications:   alprazolam 0.5 mg   bupropion xl 150 mg,   We discussed: Reports feeling much less anxious/depressed lately. Reports completing home exercises lately. Keeping great-granddaughter 3 days a week and caregiving 4 days a week.   Plan  Continue current medications   Osteopenia / Osteoporosis   Last DEXA Scan: 04/2018  T-Score femoral neck: -3  T-Score lumbar spine: -1.6   No results found for: VD25OH   Patient is a candidate for pharmacologic treatment due to T-Score < -2.5 in femoral neck  Patient has failed these meds in past: n/a Patient is currently uncontrolled on the following medications:  . Not taking supplementation  We discussed:  Recommend [phone] units of vitamin D daily. Recommend 1200 mg of calcium daily from dietary and supplemental sources. Recommend weight-bearing and muscle strengthening exercises for building and maintaining bone density.   Patient reports eating cottage cheese regularly. Eats yogurt and cheese. Drinks almond milk. Discussed calculating daily amount of calcium and begin supplementing if not reaching 1200 mg daily.    Plan  Continue current medications.     Medication Management   Pt uses [employer] for all medications Weekly pill box Pt endorses great compliance  We discussed: Good compliance with medication.    Follow up: 6 months

## 2020-10-29 NOTE — Patient Instructions (Addendum)
Visit Information  Goals Addressed            This Visit's Progress   . Pharmacy Care Plan       CARE PLAN ENTRY (see longitudinal plan of care for additional care plan information)  Current Barriers:  . Chronic Disease Management support, education, and care coordination needs related to Hypertension, GERD, Depression, and Anxiety   Hypertension BP Readings from Last 3 Encounters:  06/04/20 124/84  03/24/20 140/80  01/28/20 137/90   . Pharmacist Clinical Goal(s): o Over the next 90 days, patient will work with PharmD and providers to maintain BP goal <140/90 . Current regimen:  o Losartan 50 mg daily . Interventions: o Continue daily standing exercises.  . Patient self care activities - Over the next 90 days, patient will: o Check BP as needed, document, and provide at future appointments o Ensure daily salt intake < 2300 mg/day  GERD . Pharmacist Clinical Goal(s) o Over the next 90 days, patient will work with PharmD and providers to manage symptoms of acid reflux.  . Current regimen:  o Omeprazole 40 mg daily o Tums prn  . Interventions: o Recommend continuing to manage symptoms with small meals.  o Discussed how exercise has improved symptoms by reducing midsection weight.  . Patient self care activities - Over the next 90 days, patient will: o Recommend continuing medications as prescribed.   Medication management . Pharmacist Clinical Goal(s): o Over the next 90 days, patient will work with PharmD and providers to maintain optimal medication adherence . Current pharmacy: [facility] . Interventions o Comprehensive medication review performed. o Continue current medication management strategy . Patient self care activities - Over the next 90 days, patient will: o Focus on medication adherence by continuing to use pill box.  o Take medications as prescribed o Report any questions or concerns to PharmD and/or provider(s)  Please see past updates related  to this goal by clicking on the "Past Updates" button in the selected goal         Patient verbalizes understanding of instructions provided today.   Telephone follow up appointment with pharmacy team member scheduled for: 04/2021  Sherre Poot, PharmD, [facility] Clinical Pharmacist [facility] [phone] (office) [phone] (mobile)   Exercises To Do While Sitting  Exercises that you do while sitting (chair exercises) can give you many of the same benefits as full exercise. Benefits include strengthening your heart, burning calories, and keeping muscles and joints healthy. Exercise can also improve your mood and help with depression and anxiety. You may benefit from chair exercises if you are unable to do standing exercises because of:  Diabetic foot pain.  Obesity.  Illness.  Arthritis.  Recovery from surgery or injury.  Breathing problems.  Balance problems.  Another type of disability. Before starting chair exercises, check with your health care provider or a physical therapist to find out how much exercise you can tolerate and which exercises are safe for you. If your health care provider approves:  Start out slowly and build up over time. Aim to work up to about 10-20 minutes for each exercise session.  Make exercise part of your daily routine.  Drink water when you exercise. Do not wait until you are thirsty. Drink every 10-15 minutes.  Stop exercising right away if you have pain, nausea, shortness of breath, or dizziness.  If you are exercising in a wheelchair, make sure to lock the wheels.  Ask your health care provider whether  you can do tai chi or yoga. Many positions in these mind-body exercises can be modified to do while seated. Warm-up Before starting other exercises: 1. Sit up as straight as you can. Have your knees bent at 90 degrees, which is the shape of the capital letter "L." Keep your feet flat on the floor. 2. Sit at the front  edge of your chair, if you can. 3. Pull in (tighten) the muscles in your abdomen and stretch your spine and neck as straight as you can. Hold this position for a few minutes. 4. Breathe in and out evenly. Try to concentrate on your breathing, and relax your mind. Stretching Exercise A: Arm stretch 1. Hold your arms out straight in front of your body. 2. Bend your hands at the wrist with your fingers pointing up, as if signaling someone to stop. Notice the slight tension in your forearms as you hold the position. 3. Keeping your arms out and your hands bent, rotate your hands outward as far as you can and hold this stretch. Aim to have your thumbs pointing up and your pinkie fingers pointing down. Slowly repeat arm stretches for one minute as tolerated. Exercise B: Leg stretch 1. If you can move your legs, try to "draw" letters on the floor with the toes of your foot. Write your name with one foot. 2. Write your name with the toes of your other foot. Slowly repeat the movements for one minute as tolerated. Exercise C: Reach for the sky 1. Reach your hands as far over your head as you can to stretch your spine. 2. Move your hands and arms as if you are climbing a rope. Slowly repeat the movements for one minute as tolerated. Range of motion exercises Exercise A: Shoulder roll 1. Let your arms hang loosely at your sides. 2. Lift just your shoulders up toward your ears, then let them relax back down. 3. When your shoulders feel loose, rotate your shoulders in backward and forward circles. Do shoulder rolls slowly for one minute as tolerated. Exercise B: March in place 1. As if you are marching, pump your arms and lift your legs up and down. Lift your knees as high as you can. ? If you are unable to lift your knees, just pump your arms and move your ankles and feet up and down. March in place for one minute as tolerated. Exercise C: Seated jumping jacks 1. Let your arms hang down  straight. 2. Keeping your arms straight, lift them up over your head. Aim to point your fingers to the ceiling. 3. While you lift your arms, straighten your legs and slide your heels along the floor to your sides, as wide as you can. 4. As you bring your arms back down to your sides, slide your legs back together. ? If you are unable to use your legs, just move your arms. Slowly repeat seated jumping jacks for one minute as tolerated. Strengthening exercises Exercise A: Shoulder squeeze 1. Hold your arms straight out from your body to your sides, with your elbows bent and your fists pointed at the ceiling. 2. Keeping your arms in the bent position, move them forward so your elbows and forearms meet in front of your face. 3. Open your arms back out as wide as you can with your elbows still bent, until you feel your shoulder blades squeezing together. Hold for 5 seconds. Slowly repeat the movements forward and backward for one minute as tolerated. Contact a health care  provider if you:  Had to stop exercising due to any of the following: ? Pain. ? Nausea. ? Shortness of breath. ? Dizziness. ? Fatigue.  Have significant pain or soreness after exercising. Get help right away if you have:  Chest pain.  Difficulty breathing. These symptoms may represent a serious problem that is an emergency. Do not wait to see if the symptoms will go away. Get medical help right away. Call your local emergency services (911 in the [place] Do not drive yourself to the hospital. This information is not intended to replace advice given to you by your health care provider. Make sure you discuss any questions you have with your health care provider. Document Revised: 03/29/2019 Document Reviewed: 10/19/2017 Elsevier Patient Education  2020 [employer].

## 2020-11-12 ENCOUNTER — Ambulatory Visit: Payer: Medicare HMO | Admitting: Sports Medicine

## 2020-11-25 ENCOUNTER — Other Ambulatory Visit: Payer: Self-pay

## 2020-11-25 ENCOUNTER — Ambulatory Visit: Payer: Medicare HMO | Admitting: Sports Medicine

## 2020-11-25 ENCOUNTER — Encounter: Payer: Self-pay | Admitting: Sports Medicine

## 2020-11-25 DIAGNOSIS — M76829 Posterior tibial tendinitis, unspecified leg: Secondary | ICD-10-CM

## 2020-11-25 DIAGNOSIS — M2142 Flat foot [pes planus] (acquired), left foot: Secondary | ICD-10-CM

## 2020-11-25 DIAGNOSIS — M79674 Pain in right toe(s): Secondary | ICD-10-CM | POA: Diagnosis not present

## 2020-11-25 DIAGNOSIS — M79605 Pain in left leg: Secondary | ICD-10-CM | POA: Diagnosis not present

## 2020-11-25 DIAGNOSIS — L600 Ingrowing nail: Secondary | ICD-10-CM | POA: Diagnosis not present

## 2020-11-25 DIAGNOSIS — M797 Fibromyalgia: Secondary | ICD-10-CM

## 2020-11-25 DIAGNOSIS — M779 Enthesopathy, unspecified: Secondary | ICD-10-CM | POA: Diagnosis not present

## 2020-11-25 DIAGNOSIS — S86312A Strain of muscle(s) and tendon(s) of peroneal muscle group at lower leg level, left leg, initial encounter: Secondary | ICD-10-CM | POA: Diagnosis not present

## 2020-11-25 DIAGNOSIS — M19072 Primary osteoarthritis, left ankle and foot: Secondary | ICD-10-CM

## 2020-11-25 DIAGNOSIS — M2141 Flat foot [pes planus] (acquired), right foot: Secondary | ICD-10-CM | POA: Diagnosis not present

## 2020-11-25 NOTE — Progress Notes (Signed)
Subjective: Victoria Pena is a 69 y.o. female patient who returns to office for follow up evaluation of left foot pain. Reports that the other day she felt a sharp pull and the pain worsened over the lateral foot and lower leg on the left while exercising. Reports her supportive sleeve helps but she still has pain and swelling. States that she is on meds for fibromyalgia now and that everything hurts but left foot is worse with ankle turning more, orthotics help with affects her so much that has a hard time walking and feels unsteady. Reports that when she is in a shoe there is bad pain at her right great toe at the nail and digs in. No other acute symptoms reported.  Patient Active Problem List   Diagnosis Date Noted  . Cerumen impaction 06/04/2020  . Current mild episode of major depressive disorder ([place]) 03/24/2020  . Gastroesophageal reflux disease without esophagitis 03/24/2020  . Fibromyalgia 03/24/2020  . Arthropathy of lumbar facet joint 04/11/2019  . S/P lumbar spinal fusion 05/11/2018  . Anxiety 12/27/2016  . Benign essential hypertension 12/27/2016  . Depression 12/27/2016  . Graves disease 12/27/2016  . Palpitations 12/27/2016  . Adenocarcinoma of right lung, stage 1 ([place]) 07/26/2016  . Reactive airways dysfunction syndrome, unspecified asthma severity, uncomplicated ([place]) 71/69/6789  . S/P lobectomy of lung 07/26/2015  . HLD (hyperlipidemia) 07/22/2015  . Shortness of breath 06/10/2015  . Acquired spondylolisthesis 05/12/2015  . Postoperative hypothyroidism 01/17/2015    Current Outpatient Medications on File Prior to Visit  Medication Sig Dispense Refill  . ALPRAZolam (XANAX) 0.5 MG tablet Take 1 tablet (0.5 mg total) by mouth 2 (two) times daily as needed for anxiety. 60 tablet 3  . aspirin 81 MG tablet Take 81 mg by mouth daily.    Marland Kitchen buPROPion (WELLBUTRIN XL) 150 MG 24 hr tablet Take 1 tablet (150 mg total) by mouth daily. 90 tablet 2  . calcium carbonate (TUMS -  DOSED IN MG ELEMENTAL CALCIUM) 500 MG chewable tablet Chew 1 tablet by mouth 2 (two) times daily as needed.     Marland Kitchen ibuprofen (ADVIL,MOTRIN) 200 MG tablet Take 600 mg by mouth 2 (two) times daily as needed for mild pain.    Marland Kitchen levothyroxine (SYNTHROID) 125 MCG tablet TAKE 1 TABLET EVERY DAY 90 tablet 2  . losartan (COZAAR) 50 MG tablet TAKE 1 TABLET(50 MG) BY MOUTH ONCE DAILY 90 tablet 2  . omeprazole (PRILOSEC) 40 MG capsule TAKE 1 CAPSULE (40 MG) BY ORAL ROUTE ONE TIMES PER DAY BEFORE A MEAL 90 capsule 2  . pregabalin (LYRICA) 150 MG capsule Take 1 capsule (150 mg total) by mouth 3 (three) times daily. 60 capsule 3  . traMADol (ULTRAM) 50 MG tablet Take 1 tablet (50 mg total) by mouth 4 (four) times daily. 120 tablet 2   No current facility-administered medications on file prior to visit.    Allergies  Allergen Reactions  . Dextromethorphan Hbr Shortness Of Breath  . Keflex [Cephalexin] Shortness Of Breath    Hard time breathing  . Robaxin [Methocarbamol] Nausea Only    Stomach pain . Could not eat  . Bactrim [Sulfamethoxazole-Trimethoprim] Itching  . Hydroxyzine Other (See Comments) and Swelling    Numbness in lips  . Prednisone & Diphenhydramine Other (See Comments)    Face flushed, heart racing  . Ciprofloxacin   . Dextromethorphan Polistirex Er   . Meloxicam Other (See Comments)    Headache, body aches  . Nitrofurantoin   .  Diphenhydramine Hcl Palpitations  . Moxifloxacin Rash  . Prednisone Other (See Comments) and Palpitations    "flushing"     Objective:  General: Alert and oriented x3 in no acute distress  Dermatology: Old surgical scars, no open lesions bilateral lower extremities, no webspace macerations, no ecchymosis bilateral, all nails x 10 are well manicured with moderate incurvation to medial hallux nail at right 1st toe with no acute signs of infection very minimal swelling but increased pain to touch.  Vascular: Dorsalis Pedis and Posterior Tibial pedal pulses  1/4, Capillary Fill Time 5 seconds, + pedal hair growth bilateral, no edema bilateral lower extremities, Temperature gradient within normal limits.  Neurology: Johney Maine sensation intact via light touch bilateral.  Musculoskeletal: Mild tenderness with palpation at insertion of the Achilles on left, there is moderate tenderness palpation to the lateral ankle and at the level of the peroneal tendon course and pain that radiates up the leg, decreased pain to the sinus tarsi, there is mild tenderness at the medial ankle along the posterior tibial tendon course with significant pes planus deformity with lateral ankle impingement on weightbearing on the left greater than the right.  Right leg is noted to be shorter as compared to the left approximate only at least half inch.   Assessment and Plan: Problem List Items Addressed This Visit      Other   Fibromyalgia    Other Visit Diagnoses    Ingrown nail    -  Primary   Toe pain, right       Tendonitis       Relevant Orders   MR FOOT LEFT WO CONTRAST   Pain of left lower extremity       Relevant Orders   MR FOOT LEFT WO CONTRAST   Pes planus of both feet       PTTD (posterior tibial tendon dysfunction)       Relevant Orders   MR FOOT LEFT WO CONTRAST   Tear of peroneal tendon of left foot       Relevant Orders   MR FOOT LEFT WO CONTRAST   Arthritis of ankle, left       Relevant Orders   MR ANKLE LEFT WO CONTRAST      -Complete examination performed -Previous Xrays reviewed -Discussed treatment options for chronic tendinitis secondary to severe end-stage pes planus with gait disturbance with recent pull concerning for a tear -Rx MRI left foot and ankle for eval of tendons r/o tear -Advised patient to continue with rest ice elevation and meds for fibromyalgia -Dispensed CAM boot for patient to wear when not at work since she can not work in [employer] and dispensed trilock for patient to use when at work -Advised patient if MRIs are negative  may benefit from AFOs -Discussed treatment alternatives and plan of care; Explained permanent/temporary nail avulsion and post procedure course to patient. Patient elects for PNA Right hallux medial margin  - After a verbal consent, injected 3 ml of a 50:50 mixture of 2% plain  lidocaine and 0.5% plain marcaine in a normal hallux block fashion. Next, a  betadine prep was performed. Anesthesia was tested and found to be appropriate.  The offending Right hallux medial nail border was then incised from the hyponychium to the epinychium. The offending nail border was removed and cleared from the field. The area was curretted for any remaining nail or spicules. Phenol application performed and the area was then flushed with alcohol and dressed with antibiotic  cream and a dry sterile dressing. -Patient was instructed to leave the dressing intact for today and begin soaking  in a weak solution of betadine or epsom salt and water tomorrow. Patient was instructed to  soak for 15 minutes each day and apply neosporin and a gauze or bandaid dressing each day. -Patient was instructed to monitor the toe for signs of infection and return to office if toe becomes red, hot or swollen. -Return to office for nail check on right and discussion of left foot/ankle pain and MRI results if available by next visit.   Landis Martins, DPM

## 2020-12-04 ENCOUNTER — Other Ambulatory Visit: Payer: Self-pay | Admitting: Legal Medicine

## 2020-12-04 DIAGNOSIS — M797 Fibromyalgia: Secondary | ICD-10-CM

## 2020-12-09 ENCOUNTER — Ambulatory Visit: Payer: Medicare HMO | Admitting: Sports Medicine

## 2020-12-11 ENCOUNTER — Telehealth: Payer: Self-pay

## 2020-12-11 NOTE — Telephone Encounter (Signed)
Patient is taking lyrica 150 mg TID and she is feeling that she lost her balance, so she asked if can she takes lyrica BID. I asked Dr Tobie Poet and she said it is ok.

## 2020-12-16 ENCOUNTER — Other Ambulatory Visit: Payer: Self-pay | Admitting: Legal Medicine

## 2020-12-16 DIAGNOSIS — C3491 Malignant neoplasm of unspecified part of right bronchus or lung: Secondary | ICD-10-CM

## 2020-12-17 ENCOUNTER — Encounter: Payer: Self-pay | Admitting: Family Medicine

## 2020-12-17 ENCOUNTER — Telehealth (INDEPENDENT_AMBULATORY_CARE_PROVIDER_SITE_OTHER): Payer: Medicare HMO | Admitting: Family Medicine

## 2020-12-17 VITALS — Ht 65.0 in | Wt 180.0 lb

## 2020-12-17 DIAGNOSIS — R059 Cough, unspecified: Secondary | ICD-10-CM | POA: Diagnosis not present

## 2020-12-17 DIAGNOSIS — J020 Streptococcal pharyngitis: Secondary | ICD-10-CM | POA: Diagnosis not present

## 2020-12-17 LAB — POCT INFLUENZA A/B
Influenza A, POC: NEGATIVE
Influenza B, POC: NEGATIVE

## 2020-12-17 LAB — POCT RAPID STREP A (OFFICE): Rapid Strep A Screen: POSITIVE — AB

## 2020-12-17 LAB — POC COVID19 BINAXNOW: SARS Coronavirus 2 Ag: NEGATIVE

## 2020-12-17 MED ORDER — AMOXICILLIN 500 MG PO CAPS: 500.0000 mg | ORAL_CAPSULE | Freq: Two times a day (BID) | ORAL | 0 refills | Status: DC

## 2020-12-17 NOTE — Progress Notes (Signed)
Pt states Monday evening scratchy throat began. Yesterday dry cough started.   Complains sinus pressure, cough, scratchy throat, feels hot, headache.   Denies body aches.  Had 2 Moderna shots, had flu shot.  No booster   Hx of lung cancer. 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  Location: Patient: parking lot Provider: clinic   I discussed the limitations, risks, security and privacy concerns of performing an evaluation and management service by telephone and the availability of in person appointments. I also discussed with the patient that there may be a patient responsible charge related to this service. The patient expressed understanding and agreed to proceed.   History of Present Illness: Pt with sore throat-onset on Monday. Pt states throat was scratchy Cough, headache, no rash.  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 Pt states she feels fatigue and possibly has a fever-flushed No acute exposure. Pt has not had her booster COVID vaccine Pt states she is NOT ALLERGIC TO PENICILLIN   Observations/Objective: No vitals  Assessment and Plan: 1. Pharyngitis due to Streptococcus species-+ strep Fluids, precautions discussed. Pt out of work until [place] test returned , rapid COVID negative Amoxil-rx   Follow Up Instructions: out of work until COVID PCR resulted    I discussed the assessment and treatment plan with the patient. The patient was provided an opportunity to ask questions and all were answered. The patient agreed with the plan and demonstrated an understanding of the instructions.   The patient was advised to call back or seek an in-person evaluation if the symptoms worsen or if the condition  fails to improve as anticipated.  I provided 7 minutes of non-face-to-face time during this encounter.   Makena Murdock Hannah Beat, MD

## 2020-12-19 LAB — SARS-COV-2, NAA 2 DAY TAT

## 2020-12-19 LAB — NOVEL CORONAVIRUS, NAA: SARS-CoV-2, NAA: NOT DETECTED

## 2020-12-23 ENCOUNTER — Encounter: Payer: Self-pay | Admitting: Legal Medicine

## 2020-12-23 ENCOUNTER — Ambulatory Visit (INDEPENDENT_AMBULATORY_CARE_PROVIDER_SITE_OTHER): Payer: Medicare HMO | Admitting: Legal Medicine

## 2020-12-23 ENCOUNTER — Other Ambulatory Visit: Payer: Self-pay

## 2020-12-23 VITALS — BP 128/70 | HR 74 | Temp 95.9°F | Resp 16 | Ht 65.0 in | Wt 186.0 lb

## 2020-12-23 DIAGNOSIS — J683 Other acute and subacute respiratory conditions due to chemicals, gases, fumes and vapors: Secondary | ICD-10-CM

## 2020-12-23 DIAGNOSIS — I10 Essential (primary) hypertension: Secondary | ICD-10-CM | POA: Diagnosis not present

## 2020-12-23 DIAGNOSIS — K219 Gastro-esophageal reflux disease without esophagitis: Secondary | ICD-10-CM

## 2020-12-23 DIAGNOSIS — Z6830 Body mass index (BMI) 30.0-30.9, adult: Secondary | ICD-10-CM

## 2020-12-23 DIAGNOSIS — F320 Major depressive disorder, single episode, mild: Secondary | ICD-10-CM

## 2020-12-23 DIAGNOSIS — C3491 Malignant neoplasm of unspecified part of right bronchus or lung: Secondary | ICD-10-CM | POA: Diagnosis not present

## 2020-12-23 DIAGNOSIS — M25562 Pain in left knee: Secondary | ICD-10-CM

## 2020-12-23 DIAGNOSIS — F064 Anxiety disorder due to known physiological condition: Secondary | ICD-10-CM

## 2020-12-23 DIAGNOSIS — M797 Fibromyalgia: Secondary | ICD-10-CM | POA: Diagnosis not present

## 2020-12-23 DIAGNOSIS — M4310 Spondylolisthesis, site unspecified: Secondary | ICD-10-CM

## 2020-12-23 DIAGNOSIS — E890 Postprocedural hypothyroidism: Secondary | ICD-10-CM | POA: Diagnosis not present

## 2020-12-23 DIAGNOSIS — L97521 Non-pressure chronic ulcer of other part of left foot limited to breakdown of skin: Secondary | ICD-10-CM

## 2020-12-23 DIAGNOSIS — G8929 Other chronic pain: Secondary | ICD-10-CM

## 2020-12-23 HISTORY — DX: Body mass index (BMI) 30.0-30.9, adult: Z68.30

## 2020-12-23 HISTORY — DX: Non-pressure chronic ulcer of other part of left foot limited to breakdown of skin: L97.521

## 2020-12-23 MED ORDER — ALPRAZOLAM 0.5 MG PO TABS: 0.5000 mg | ORAL_TABLET | Freq: Two times a day (BID) | ORAL | 3 refills | Status: DC | PRN

## 2020-12-23 MED ORDER — PREGABALIN 150 MG PO CAPS: 150.0000 mg | ORAL_CAPSULE | Freq: Two times a day (BID) | ORAL | 5 refills | Status: DC

## 2020-12-23 MED ORDER — BUPROPION HCL ER (XL) 150 MG PO TB24: 150.0000 mg | ORAL_TABLET | Freq: Every day | ORAL | 2 refills | Status: DC

## 2020-12-23 MED ORDER — TRAMADOL HCL 50 MG PO TABS
50.0000 mg | ORAL_TABLET | Freq: Four times a day (QID) | ORAL | 2 refills | Status: DC
Start: 2020-12-23 — End: 2021-05-05

## 2020-12-23 NOTE — Progress Notes (Signed)
Subjective:  Patient ID: Victoria Pena, female    DOB: 1951/05/02  Age: 70 y.o. MRN: [id number]  Chief Complaint  Patient presents with  . Hypertension  . Gastroesophageal Reflux  . Hypothyroidism    HPI: chronic visit  Patient presents for follow up of hypertension.  Patient tolerating losartan well with side effects.  Patient was diagnosed with hypertension 2010 so has been treated for hypertension for 10 years.Patient is working on maintaining diet and exercise regimen and follows up as directed. Complication include none.  Patient has gastroesophageal reflux symptoms withesophagitis and LTRD.  The symptoms are moderste intensity.  Length of symptoms 10 years.  Medicines include omeprazole.  Complications include none.  Patient has HYPOTHYROIDISM.  Diagnosed 10 years ago.  Patient has stable thyroid readings.  Patient is having none.  Last TSH was normal.  continue dosage of thyroid medicine.   Current Outpatient Medications on File Prior to Visit  Medication Sig Dispense Refill  . amoxicillin (AMOXIL) 500 MG capsule Take 1 capsule (500 mg total) by mouth 2 (two) times daily. 20 capsule 0  . aspirin 81 MG tablet Take 81 mg by mouth daily.    . calcium carbonate (TUMS - DOSED IN MG ELEMENTAL CALCIUM) 500 MG chewable tablet Chew 1 tablet by mouth 2 (two) times daily as needed.     Marland Kitchen ibuprofen (ADVIL,MOTRIN) 200 MG tablet Take 600 mg by mouth 2 (two) times daily as needed for mild pain.    Marland Kitchen levothyroxine (SYNTHROID) 125 MCG tablet TAKE 1 TABLET EVERY DAY 90 tablet 2  . losartan (COZAAR) 50 MG tablet TAKE 1 TABLET(50 MG) BY MOUTH ONCE DAILY 90 tablet 2  . omeprazole (PRILOSEC) 40 MG capsule TAKE 1 CAPSULE (40 MG) BY ORAL ROUTE ONE TIMES PER DAY BEFORE A MEAL 90 capsule 2   No current facility-administered medications on file prior to visit.   Past Medical History:  Diagnosis Date  . Adenocarcinoma of right lung, stage 1 ([place]) 07/26/2016  . Anxiety   . COPD (chronic obstructive  pulmonary disease) ([place])    pt denies  . Depression   . Hypertension   . Thyroid disease    Past Surgical History:  Procedure Laterality Date  . ANKLE SURGERY     bilateral  . APPENDECTOMY    . CHOLECYSTECTOMY    . REPLACEMENT TOTAL KNEE Left   . THYROIDECTOMY      Family History  Problem Relation Age of Onset  . Cancer Mother   . Cancer Father   . Cancer Brother    Social History   Socioeconomic History  . Marital status: Widowed    Spouse name: Not on file  . Number of children: Not on file  . Years of education: Not on file  . Highest education level: Not on file  Occupational History  . Not on file  Tobacco Use  . Smoking status: Former Smoker    Packs/day: 1.50    Years: 35.00    Pack years: 52.50    Quit date: 07/27/1999    Years since quitting: 21.4  . Smokeless tobacco: Never Used  Vaping Use  . Vaping Use: Never used  Substance and Sexual Activity  . Alcohol use: No  . Drug use: No  . Sexual activity: Not Currently  Other Topics Concern  . Not on file  Social History Narrative  . Not on file   Social Determinants of Health   Financial Resource Strain: Not on file  Food Insecurity:  No Food Insecurity  . Worried About Charity fundraiser in the Last Year: Never true  . Ran Out of Food in the Last Year: Never true  Transportation Needs: No Transportation Needs  . Lack of Transportation (Medical): No  . Lack of Transportation (Non-Medical): No  Physical Activity: Not on file  Stress: Stress Concern Present  . Feeling of Stress : Very much  Social Connections: Not on file    Review of Systems  Constitutional: Negative for activity change, fatigue and unexpected weight change.  HENT: Negative for congestion and sinus pain.   Eyes: Negative for visual disturbance.  Respiratory: Negative for cough and wheezing.   Cardiovascular: Negative for chest pain, palpitations and leg swelling.  Gastrointestinal: Negative.  Negative for abdominal distention  and abdominal pain.  Endocrine: Negative for polyuria.  Genitourinary: Negative for difficulty urinating, dyspareunia and urgency.  Musculoskeletal: Negative for arthralgias and back pain.  Skin: Negative.   Neurological: Negative for dizziness, weakness and numbness.  Psychiatric/Behavioral: Negative.      Objective:  BP 128/70 (BP Location: Right Arm, Patient Position: Sitting, Cuff Size: Normal)   Pulse 74   Temp (!) 95.9 F (35.5 C) (Temporal)   Resp 16   Ht 5\' 5"  (1.651 m)   Wt 186 lb (84.4 kg)   LMP  (LMP Unknown)   SpO2 97%   BMI 30.95 kg/m   BP/Weight 12/23/2020 12/17/2020 55/06/3219  Systolic BP 254 - 270  Diastolic BP 70 - 80  Wt. (Lbs) 186 180 177  BMI 30.95 29.95 29.45    Physical Exam Vitals reviewed.  Constitutional:      General: She is not in acute distress.    Appearance: Normal appearance.  HENT:     Head: Normocephalic.     Right Ear: Tympanic membrane, ear canal and external ear normal.     Left Ear: Tympanic membrane, ear canal and external ear normal.     Mouth/Throat:     Mouth: Mucous membranes are moist.     Pharynx: Oropharynx is clear.  Eyes:     Conjunctiva/sclera: Conjunctivae normal.     Pupils: Pupils are equal, round, and reactive to light.  Cardiovascular:     Rate and Rhythm: Normal rate and regular rhythm.     Pulses: Normal pulses.     Heart sounds: No murmur heard. No gallop.   Pulmonary:     Effort: Pulmonary effort is normal.     Breath sounds: Normal breath sounds.  Abdominal:     General: Abdomen is flat. Bowel sounds are normal. There is no distension.     Tenderness: There is no abdominal tenderness.  Musculoskeletal:        General: Tenderness present. Normal range of motion.     Cervical back: Normal range of motion and neck supple.     Comments: Pain and effusion left knee, she had a revision in past, original TKA in 2013  Skin:    General: Skin is warm and dry.     Capillary Refill: Capillary refill takes less  than 2 seconds.  Neurological:     General: No focal deficit present.     Mental Status: She is alert and oriented to person, place, and time. Mental status is at baseline.  Psychiatric:        Mood and Affect: Mood normal.        Thought Content: Thought content normal.        Judgment: Judgment normal.  Depression screen [facility] 2/9 10/29/2020 08/11/2020 03/24/2020 03/19/2020  Decreased Interest 1 3 3 3   Down, Depressed, Hopeless 1 2 2 3   PHQ - 2 Score 2 5 5 6   Altered sleeping 0 3 3 3   Tired, decreased energy 0 2 3 3   Change in appetite 0 2 2 0  Feeling bad or failure about yourself  0 0 2 3  Trouble concentrating 0 0 2 3  Moving slowly or fidgety/restless 0 0 0 0  Suicidal thoughts 0 0 0 0  PHQ-9 Score 2 12 17 18   Difficult doing work/chores - Very difficult Very difficult -       Lab Results  Component Value Date   WBC 5.3 08/11/2020   HGB 11.4 08/11/2020   HCT 37.2 08/11/2020   PLT 363 08/11/2020   GLUCOSE 94 08/11/2020   ALT 11 08/11/2020   AST 12 08/11/2020   NA 144 08/11/2020   K 4.7 08/11/2020   CL 109 (H) 08/11/2020   CREATININE 0.74 08/11/2020   BUN 16 08/11/2020   CO2 22 08/11/2020   TSH 2.340 08/11/2020   INR 0.95 05/03/2018      Assessment & Plan:  Diagnoses and all orders for this visit: Essential hypertension -     Comprehensive metabolic panel An individual hypertension care plan was established and reinforced today.  The patient's status was assessed using clinical findings on exam and labs or diagnostic tests. The patient's success at meeting treatment goals on disease specific evidence-based guidelines and found to be well controlled. SELF MANAGEMENT: The patient and I together assessed ways to personally work towards obtaining the recommended goals. RECOMMENDATIONS: avoid decongestants found in common cold remedies, decrease consumption of alcohol, perform routine monitoring of BP with home BP cuff, exercise, reduction of dietary salt, take  medicines as prescribed, try not to miss doses and quit smoking.  Regular exercise and maintaining a healthy weight is needed.  Stress reduction may help. A CLINICAL SUMMARY including written plan identify barriers to care unique to individual due to social or financial issues.  We attempt to mutually creat solutions for individual and family understanding.  Gastroesophageal reflux disease without esophagitis Plan of care was formulated today.  She is doing well.  A plan of care was formulated using patient exam, tests and other sources to optimize care using evidence based information.  Recommend no smoking, no eating after supper, avoid fatty foods, elevate Head of bed, avoid tight fitting clothing.  Continue on omeprazole . Fibromyalgia -     pregabalin (LYRICA) 150 MG capsule; Take 1 capsule (150 mg total) by mouth 2 (two) times daily. She is much improved with lyrica  Postoperative hypothyroidism -     TSH -     CBC with Differential/Platelet Patient is known to have hypothyroid and is n treatment with levothyroxine 125 mcg.  Patient was diagnosed 10 years ago.  Other treatment includes none.  Patient is compliant with medicines and last TSH 6 months ago.  Last TSH was normal . Reactive airways dysfunction syndrome, unspecified asthma severity, uncomplicated ([place]) This patient has asthma mild and is on albuterol PRN.  Patient is not having a flair.  Chronic medicines include albuterol. Addition new medicines none.  Asthma action plan is in place.   Anxiety disorder due to known physiological condition -     ALPRAZolam (XANAX) 0.5 MG tablet; Take 1 tablet (0.5 mg total) by mouth 2 (two) times daily as needed for anxiety. Patient's anxiety is under  controll  Adenocarcinoma of right lung, stage 1 ([facility]) -     buPROPion (WELLBUTRIN XL) 150 MG 24 hr tablet; Take 1 tablet (150 mg total) by mouth daily. Adenocarcinoma is stable and under survielance  Toe ulcer, left, limited to breakdown of  skin [facility]) Seeing podiatrist  Current mild episode of major depressive disorder, unspecified whether recurrent ([place]) Patient's depression is controlled with buproprion.   Anhedonia better.  PHQ 9 was not performed score na. An individual care plan was established or reinforced today.  The patient's disease status was assessed using clinical findings on exam, labs, and or other diagnostic testing to determine patient's success in meeting treatment goals based on disease specific evidence-based guidelines and found to be improving Recommendations include stay on medicines  BMI 30.0-30.9,adult An individualize plan was formulated for obesity using patient history and physical exam to encourage weight loss.  An evidence based program was formulated.  Patient is to cut portion size with meals and to plan physical exercise 3 days a week at least 20 minutes.  Weight watchers and other programs are helpful.  Planned amount of weight loss 10 lbs.  Chronic pain of left knee -     Ambulatory referral to Orthopedic Surgery Patient has chronic pain left knee after revision in past.  Refer to Dr. Ronnie Derby  Acquired spondylolisthesis -     traMADol (ULTRAM) 50 MG tablet; Take 1 tablet (50 mg total) by mouth 4 (four) times daily. Chronic LBP relieved by tramadol          I spent 25 minutes dedicated to the care of this patient on the date of this encounter to include face-to-face time with the patient, as well as:   Follow-up: Return in about 6 months (around 06/22/2021), or fsting.  An After Visit Summary was printed and given to the patient.  Reinaldo Meeker, MD [facility] [phone]

## 2020-12-24 ENCOUNTER — Other Ambulatory Visit: Payer: Self-pay

## 2020-12-24 ENCOUNTER — Other Ambulatory Visit: Payer: Self-pay | Admitting: Legal Medicine

## 2020-12-24 DIAGNOSIS — C3491 Malignant neoplasm of unspecified part of right bronchus or lung: Secondary | ICD-10-CM

## 2020-12-24 LAB — COMPREHENSIVE METABOLIC PANEL
ALT: 8 IU/L (ref 0–32)
AST: 13 IU/L (ref 0–40)
Albumin/Globulin Ratio: 1.4 (ref 1.2–2.2)
Albumin: 3.8 g/dL (ref 3.8–4.8)
Alkaline Phosphatase: 160 IU/L — ABNORMAL HIGH (ref 44–121)
BUN/Creatinine Ratio: 24 (ref 12–28)
BUN: 15 mg/dL (ref 8–27)
Bilirubin Total: 0.3 mg/dL (ref 0.0–1.2)
CO2: 21 mmol/L (ref 20–29)
Calcium: 8.9 mg/dL (ref 8.7–10.3)
Chloride: 104 mmol/L (ref 96–106)
Creatinine, Ser: 0.62 mg/dL (ref 0.57–1.00)
GFR calc Af Amer: 106 mL/min/{1.73_m2} (ref >59)
GFR calc non Af Amer: 92 mL/min/{1.73_m2} (ref >59)
Globulin, Total: 2.7 g/dL (ref 1.5–4.5)
Glucose: 101 mg/dL — ABNORMAL HIGH (ref 65–99)
Potassium: 3.9 mmol/L (ref 3.5–5.2)
Sodium: 141 mmol/L (ref 134–144)
Total Protein: 6.5 g/dL (ref 6.0–8.5)

## 2020-12-24 LAB — CBC WITH DIFFERENTIAL/PLATELET
Basophils Absolute: 0 10*3/uL (ref 0.0–0.2)
Basos: 1 %
EOS (ABSOLUTE): 0.1 10*3/uL (ref 0.0–0.4)
Eos: 1 %
Hematocrit: 40.4 % (ref 34.0–46.6)
Hemoglobin: 13.2 g/dL (ref 11.1–15.9)
Immature Grans (Abs): 0 10*3/uL (ref 0.0–0.1)
Immature Granulocytes: 1 %
Lymphocytes Absolute: 1.8 10*3/uL (ref 0.7–3.1)
Lymphs: 33 %
MCH: 26.8 pg (ref 26.6–33.0)
MCHC: 32.7 g/dL (ref 31.5–35.7)
MCV: 82 fL (ref 79–97)
Monocytes Absolute: 0.6 10*3/uL (ref 0.1–0.9)
Monocytes: 10 %
Neutrophils Absolute: 2.9 10*3/uL (ref 1.4–7.0)
Neutrophils: 54 %
Platelets: 264 10*3/uL (ref 150–450)
RBC: 4.93 x10E6/uL (ref 3.77–5.28)
RDW: 16 % — ABNORMAL HIGH (ref 11.7–15.4)
WBC: 5.5 10*3/uL (ref 3.4–10.8)

## 2020-12-24 LAB — TSH: TSH: 10.4 u[IU]/mL — ABNORMAL HIGH (ref 0.450–4.500)

## 2020-12-24 MED ORDER — LEVOTHYROXINE SODIUM 137 MCG PO TABS: 125.0000 ug | ORAL_TABLET | Freq: Every day | ORAL | 2 refills | Status: DC

## 2020-12-24 NOTE — Progress Notes (Signed)
Tsh high is she taking thyroid medicine, cbc normal, glucose 101, kidney tests normal,liver tests normal lp

## 2020-12-24 NOTE — Progress Notes (Signed)
I called in thyroid to 142mcg a day and recheck in 6 weeks lp

## 2020-12-24 NOTE — Telephone Encounter (Signed)
Patient is requesting medication be sent to [facility]. Pharmacy has been changed.

## 2020-12-25 ENCOUNTER — Ambulatory Visit
Admission: RE | Admit: 2020-12-25 | Discharge: 2020-12-25 | Disposition: A | Discharge status: Home / Self Care | Payer: Medicare HMO | Source: Ambulatory Visit | Attending: Sports Medicine | Admitting: Sports Medicine

## 2020-12-25 ENCOUNTER — Other Ambulatory Visit: Payer: Self-pay

## 2020-12-25 DIAGNOSIS — R600 Localized edema: Secondary | ICD-10-CM | POA: Diagnosis not present

## 2020-12-25 DIAGNOSIS — M79605 Pain in left leg: Secondary | ICD-10-CM

## 2020-12-25 DIAGNOSIS — M76829 Posterior tibial tendinitis, unspecified leg: Secondary | ICD-10-CM

## 2020-12-25 DIAGNOSIS — M19072 Primary osteoarthritis, left ankle and foot: Secondary | ICD-10-CM | POA: Diagnosis not present

## 2020-12-25 DIAGNOSIS — S86312A Strain of muscle(s) and tendon(s) of peroneal muscle group at lower leg level, left leg, initial encounter: Secondary | ICD-10-CM

## 2020-12-25 DIAGNOSIS — M779 Enthesopathy, unspecified: Secondary | ICD-10-CM

## 2020-12-25 DIAGNOSIS — M2012 Hallux valgus (acquired), left foot: Secondary | ICD-10-CM | POA: Diagnosis not present

## 2021-01-08 ENCOUNTER — Telehealth: Payer: Self-pay | Admitting: Sports Medicine

## 2021-01-08 DIAGNOSIS — M25562 Pain in left knee: Secondary | ICD-10-CM | POA: Diagnosis not present

## 2021-01-08 DIAGNOSIS — Z96652 Presence of left artificial knee joint: Secondary | ICD-10-CM | POA: Diagnosis not present

## 2021-01-08 DIAGNOSIS — M21062 Valgus deformity, not elsewhere classified, left knee: Secondary | ICD-10-CM | POA: Diagnosis not present

## 2021-01-08 DIAGNOSIS — Z471 Aftercare following joint replacement surgery: Secondary | ICD-10-CM | POA: Diagnosis not present

## 2021-01-08 DIAGNOSIS — T8484XA Pain due to internal orthopedic prosthetic devices, implants and grafts, initial encounter: Secondary | ICD-10-CM | POA: Diagnosis not present

## 2021-01-08 NOTE — Telephone Encounter (Signed)
Set her up with a virtual visit to discuss the results Thanks Dr. Chauncey Cruel

## 2021-01-08 NOTE — Telephone Encounter (Signed)
Pt is requesting MRI results-saw in Mychart but doesn't understand it

## 2021-01-12 NOTE — Telephone Encounter (Signed)
Lm for pt to call to set up virtual visit/reb

## 2021-01-13 ENCOUNTER — Ambulatory Visit: Payer: Medicare HMO

## 2021-01-13 ENCOUNTER — Ambulatory Visit: Payer: Medicare HMO | Admitting: Legal Medicine

## 2021-01-14 ENCOUNTER — Encounter: Payer: Self-pay | Admitting: Sports Medicine

## 2021-01-14 ENCOUNTER — Telehealth (INDEPENDENT_AMBULATORY_CARE_PROVIDER_SITE_OTHER): Payer: Medicare HMO | Admitting: Sports Medicine

## 2021-01-14 ENCOUNTER — Other Ambulatory Visit: Payer: Self-pay

## 2021-01-14 NOTE — Progress Notes (Signed)
Virtual visit attempted.  Patient did not answer telephone; left voicemail detailing the results of her MRI which reveals arthritis and changes consistent with flatfeet.  I advised patient over the voicemail that the best management for her flatfeet is a more controlling brace or AFO and anti-inflammatory medicines for arthritis.  There is no cure for arthritis.  Advised patient if she has other questions may call office back to arrange another time to discuss with me at her convenience.  Callback number provided. -Dr. Cannon Kettle

## 2021-01-20 ENCOUNTER — Telehealth: Payer: Self-pay

## 2021-01-20 NOTE — Progress Notes (Signed)
Chronic Care Management Pharmacy Assistant   Name: Victoria Pena  MRN: [id number] DOB: 1951/04/07  Reason for Encounter: Disease State call for hypertension  Patient Questions:  1.  Have you seen any other providers since your last visit? Yes, 12/23/20-PCP 01/08/21-Orthopedic 01/14/21-Podiatry  2.  Any changes in your medicines or health? Yes, change made to thyroid medication due to unexplained weight gain.  She is having a lot of foot and hip pain.  Having a CT scan on 01/23/21 for her lung.     PCP : Lillard Anes, MD  Allergies:   Allergies  Allergen Reactions  . Dextromethorphan Hbr Shortness Of Breath  . Keflex [Cephalexin] Shortness Of Breath    Hard time breathing  . Robaxin [Methocarbamol] Nausea Only    Stomach pain . Could not eat  . Bactrim [Sulfamethoxazole-Trimethoprim] Itching  . Hydroxyzine Other (See Comments) and Swelling    Numbness in lips  . Prednisone & Diphenhydramine Other (See Comments)    Face flushed, heart racing  . Ciprofloxacin   . Dextromethorphan Polistirex Er   . Meloxicam Other (See Comments)    Headache, body aches  . Nitrofurantoin   . Diphenhydramine Hcl Palpitations  . Moxifloxacin Rash  . Prednisone Other (See Comments) and Palpitations    "flushing"     Medications: Outpatient Encounter Medications as of 01/20/2021  Medication Sig  . ALPRAZolam (XANAX) 0.5 MG tablet Take 1 tablet (0.5 mg total) by mouth 2 (two) times daily as needed for anxiety.  Marland Kitchen amoxicillin (AMOXIL) 500 MG capsule Take 1 capsule (500 mg total) by mouth 2 (two) times daily.  Marland Kitchen aspirin 81 MG tablet Take 81 mg by mouth daily.  Marland Kitchen buPROPion (WELLBUTRIN XL) 150 MG 24 hr tablet Take 1 tablet (150 mg total) by mouth daily.  . calcium carbonate (TUMS - DOSED IN MG ELEMENTAL CALCIUM) 500 MG chewable tablet Chew 1 tablet by mouth 2 (two) times daily as needed.   Marland Kitchen ibuprofen (ADVIL,MOTRIN) 200 MG tablet Take 600 mg by mouth 2 (two) times daily as needed for mild  pain.  Marland Kitchen levothyroxine (SYNTHROID) 137 MCG tablet Take 1 tablet (137 mcg total) by mouth daily.  Marland Kitchen losartan (COZAAR) 50 MG tablet TAKE 1 TABLET(50 MG) BY MOUTH ONCE DAILY  . omeprazole (PRILOSEC) 40 MG capsule TAKE 1 CAPSULE (40 MG) BY ORAL ROUTE ONE TIMES PER DAY BEFORE A MEAL  . pregabalin (LYRICA) 150 MG capsule Take 1 capsule (150 mg total) by mouth 2 (two) times daily.  . traMADol (ULTRAM) 50 MG tablet Take 1 tablet (50 mg total) by mouth 4 (four) times daily.   No facility-administered encounter medications on file as of 01/20/2021.    Current Diagnosis: Patient Active Problem List   Diagnosis Date Noted  . Toe ulcer, left, limited to breakdown of skin ([place]) 12/23/2020  . BMI 30.0-30.9,adult 12/23/2020  . Pharyngitis due to Streptococcus species 12/17/2020  . Cough 12/17/2020  . Cerumen impaction 06/04/2020  . Current mild episode of major depressive disorder ([place]) 03/24/2020  . Gastroesophageal reflux disease without esophagitis 03/24/2020  . Fibromyalgia 03/24/2020  . Arthropathy of lumbar facet joint 04/11/2019  . S/P lumbar spinal fusion 05/11/2018  . Anxiety 12/27/2016  . Benign essential hypertension 12/27/2016  . Depression 12/27/2016  . Graves disease 12/27/2016  . Palpitations 12/27/2016  . Adenocarcinoma of right lung, stage 1 ([place]) 07/26/2016  . Reactive airways dysfunction syndrome, unspecified asthma severity, uncomplicated ([place]) 02/58/5277  . S/P lobectomy of lung 07/26/2015  .  HLD (hyperlipidemia) 07/22/2015  . Shortness of breath 06/10/2015  . Acquired spondylolisthesis 05/12/2015  . Postoperative hypothyroidism 01/17/2015   Reviewed chart prior to disease state call. Spoke with patient regarding BP  Recent Office Vitals: BP Readings from Last 3 Encounters:  12/23/20 128/70  09/23/20 100/80  08/11/20 124/80   Pulse Readings from Last 3 Encounters:  12/23/20 74  09/23/20 74  08/11/20 84    Wt Readings from Last 3 Encounters:  12/23/20 186 lb (84.4  kg)  12/17/20 180 lb (81.6 kg)  09/23/20 177 lb (80.3 kg)     Kidney Function Lab Results  Component Value Date/Time   CREATININE 0.62 12/23/2020 11:23 AM   CREATININE 0.74 08/11/2020 10:52 AM   CREATININE 0.77 01/25/2020 01:45 PM   CREATININE 0.75 01/24/2019 10:46 AM   CREATININE 0.8 11/22/2017 02:51 PM   CREATININE 0.8 05/24/2017 09:34 AM   GFRNONAA 92 12/23/2020 11:23 AM   GFRNONAA >60 01/25/2020 01:45 PM   GFRAA 106 12/23/2020 11:23 AM   GFRAA >60 01/25/2020 01:45 PM    BMP Latest Ref Rng & Units 12/23/2020 08/11/2020 03/24/2020  Glucose 65 - 99 mg/dL 101(H) 94 95  BUN 8 - 27 mg/dL 15 16 17   Creatinine 0.57 - 1.00 mg/dL 0.62 0.74 0.72  BUN/Creat Ratio 12 - 28 24 22 24   Sodium 134 - 144 mmol/L 141 144 142  Potassium 3.5 - 5.2 mmol/L 3.9 4.7 5.1  Chloride 96 - 106 mmol/L 104 109(H) 106  CO2 20 - 29 mmol/L 21 22 24   Calcium 8.7 - 10.3 mg/dL 8.9 9.4 9.8    . Current antihypertensive regimen:  Losartan . How often are you checking your Blood Pressure? daily   . Current home BP readings: patient stated her readings are between 120/70 to 130/64  . What recent interventions/DTPs have been made by any provider to improve Blood Pressure control since last CPP Visit: Patient watches her red meat intake, she eats brunch everyday, she has things like, cottage cheese with blueberries, salad with tuna, sardines with crackers.  She stated she has been having a thyroid issue, she has gained 10 pounds, so the doctor adjusted her medication to try and help that.  . Any recent hospitalizations or ED visits since last visit with CPP? No   . What diet changes have been made to improve Blood Pressure Control? Patient watches her red meat and salt, but otherwise doesn't watch it very closely.  . What exercise is being done to improve your Blood Pressure Control?  Patient works daily, she wants to walk more, but has arthritis in her feet and hips, she stated she is in pain all the time.  She takes  Lyrica and will use Tramadol as needed.    Adherence Review: Is the patient currently on ACE/ARB medication? Yes Does the patient have >5 day gap between last estimated fill dates? No  Follow-Up:  Pharmacist Review   Donette Larry, CPP notified  Clarita Leber, [place] Pharmacist Assistant [phone]

## 2021-01-23 ENCOUNTER — Other Ambulatory Visit: Payer: Self-pay

## 2021-01-23 ENCOUNTER — Ambulatory Visit (HOSPITAL_COMMUNITY)
Admission: RE | Admit: 2021-01-23 | Discharge: 2021-01-23 | Disposition: A | Discharge status: Home / Self Care | Payer: Medicare HMO | Source: Ambulatory Visit | Attending: Internal Medicine | Admitting: Internal Medicine

## 2021-01-23 ENCOUNTER — Encounter (HOSPITAL_COMMUNITY): Payer: Self-pay

## 2021-01-23 ENCOUNTER — Inpatient Hospital Stay: Payer: Medicare HMO | Attending: Internal Medicine

## 2021-01-23 DIAGNOSIS — J439 Emphysema, unspecified: Secondary | ICD-10-CM | POA: Diagnosis not present

## 2021-01-23 DIAGNOSIS — Z7982 Long term (current) use of aspirin: Secondary | ICD-10-CM | POA: Insufficient documentation

## 2021-01-23 DIAGNOSIS — Z9221 Personal history of antineoplastic chemotherapy: Secondary | ICD-10-CM | POA: Diagnosis not present

## 2021-01-23 DIAGNOSIS — F32A Depression, unspecified: Secondary | ICD-10-CM | POA: Insufficient documentation

## 2021-01-23 DIAGNOSIS — C3490 Malignant neoplasm of unspecified part of unspecified bronchus or lung: Secondary | ICD-10-CM | POA: Insufficient documentation

## 2021-01-23 DIAGNOSIS — I10 Essential (primary) hypertension: Secondary | ICD-10-CM | POA: Insufficient documentation

## 2021-01-23 DIAGNOSIS — J449 Chronic obstructive pulmonary disease, unspecified: Secondary | ICD-10-CM | POA: Diagnosis not present

## 2021-01-23 DIAGNOSIS — Z902 Acquired absence of lung [part of]: Secondary | ICD-10-CM | POA: Diagnosis not present

## 2021-01-23 DIAGNOSIS — Z79899 Other long term (current) drug therapy: Secondary | ICD-10-CM | POA: Diagnosis not present

## 2021-01-23 DIAGNOSIS — F419 Anxiety disorder, unspecified: Secondary | ICD-10-CM | POA: Diagnosis not present

## 2021-01-23 DIAGNOSIS — Z85118 Personal history of other malignant neoplasm of bronchus and lung: Secondary | ICD-10-CM | POA: Insufficient documentation

## 2021-01-23 DIAGNOSIS — E079 Disorder of thyroid, unspecified: Secondary | ICD-10-CM | POA: Diagnosis not present

## 2021-01-23 DIAGNOSIS — Z9049 Acquired absence of other specified parts of digestive tract: Secondary | ICD-10-CM | POA: Diagnosis not present

## 2021-01-23 DIAGNOSIS — I2510 Atherosclerotic heart disease of native coronary artery without angina pectoris: Secondary | ICD-10-CM | POA: Diagnosis not present

## 2021-01-23 DIAGNOSIS — I318 Other specified diseases of pericardium: Secondary | ICD-10-CM | POA: Diagnosis not present

## 2021-01-23 LAB — CMP (CANCER CENTER ONLY)
ALT: 12 U/L (ref 0–44)
AST: 13 U/L — ABNORMAL LOW (ref 15–41)
Albumin: 3.4 g/dL — ABNORMAL LOW (ref 3.5–5.0)
Alkaline Phosphatase: 167 U/L — ABNORMAL HIGH (ref 38–126)
Anion gap: 8 (ref 5–15)
BUN: 14 mg/dL (ref 8–23)
CO2: 26 mmol/L (ref 22–32)
Calcium: 8.8 mg/dL — ABNORMAL LOW (ref 8.9–10.3)
Chloride: 106 mmol/L (ref 98–111)
Creatinine: 0.74 mg/dL (ref 0.44–1.00)
GFR, Estimated: >60 mL/min (ref >60)
Glucose, Bld: 98 mg/dL (ref 70–99)
Potassium: 3.8 mmol/L (ref 3.5–5.1)
Sodium: 140 mmol/L (ref 135–145)
Total Bilirubin: 0.4 mg/dL (ref 0.3–1.2)
Total Protein: 6.8 g/dL (ref 6.5–8.1)

## 2021-01-23 LAB — CBC WITH DIFFERENTIAL (CANCER CENTER ONLY)
Abs Immature Granulocytes: 0.03 10*3/uL (ref 0.00–0.07)
Basophils Absolute: 0 10*3/uL (ref 0.0–0.1)
Basophils Relative: 1 %
Eosinophils Absolute: 0.1 10*3/uL (ref 0.0–0.5)
Eosinophils Relative: 1 %
HCT: 40.6 % (ref 36.0–46.0)
Hemoglobin: 12.4 g/dL (ref 12.0–15.0)
Immature Granulocytes: 1 %
Lymphocytes Relative: 26 %
Lymphs Abs: 1.5 10*3/uL (ref 0.7–4.0)
MCH: 26.7 pg (ref 26.0–34.0)
MCHC: 30.5 g/dL (ref 30.0–36.0)
MCV: 87.5 fL (ref 80.0–100.0)
Monocytes Absolute: 0.5 10*3/uL (ref 0.1–1.0)
Monocytes Relative: 8 %
Neutro Abs: 3.8 10*3/uL (ref 1.7–7.7)
Neutrophils Relative %: 63 %
Platelet Count: 256 10*3/uL (ref 150–400)
RBC: 4.64 MIL/uL (ref 3.87–5.11)
RDW: 17.2 % — ABNORMAL HIGH (ref 11.5–15.5)
WBC Count: 6 10*3/uL (ref 4.0–10.5)
nRBC: 0 % (ref 0.0–0.2)

## 2021-01-23 MED ORDER — IOHEXOL 300 MG/ML  SOLN
75.0000 mL | Freq: Once | INTRAMUSCULAR | Status: AC | PRN
  Administered 2021-01-23: 75 mL via INTRAVENOUS

## 2021-01-26 ENCOUNTER — Telehealth: Payer: Self-pay | Admitting: Medical Oncology

## 2021-01-26 ENCOUNTER — Inpatient Hospital Stay: Payer: Medicare HMO | Admitting: Internal Medicine

## 2021-01-26 ENCOUNTER — Telehealth: Payer: Self-pay | Admitting: Internal Medicine

## 2021-01-26 NOTE — Telephone Encounter (Signed)
LVM re today's appt -Is she keeping her appt.

## 2021-01-26 NOTE — Telephone Encounter (Signed)
Scheduled appt per 2/7 schmsg - pt is aware of apt date and time

## 2021-01-28 ENCOUNTER — Inpatient Hospital Stay (HOSPITAL_BASED_OUTPATIENT_CLINIC_OR_DEPARTMENT_OTHER): Payer: Medicare HMO | Admitting: Internal Medicine

## 2021-01-28 ENCOUNTER — Other Ambulatory Visit: Payer: Self-pay

## 2021-01-28 VITALS — BP 119/79 | HR 85 | Temp 97.8°F | Resp 18 | Ht 65.0 in | Wt 187.8 lb

## 2021-01-28 DIAGNOSIS — Z9221 Personal history of antineoplastic chemotherapy: Secondary | ICD-10-CM | POA: Diagnosis not present

## 2021-01-28 DIAGNOSIS — I2510 Atherosclerotic heart disease of native coronary artery without angina pectoris: Secondary | ICD-10-CM | POA: Diagnosis not present

## 2021-01-28 DIAGNOSIS — F32A Depression, unspecified: Secondary | ICD-10-CM | POA: Diagnosis not present

## 2021-01-28 DIAGNOSIS — E079 Disorder of thyroid, unspecified: Secondary | ICD-10-CM | POA: Diagnosis not present

## 2021-01-28 DIAGNOSIS — Z85118 Personal history of other malignant neoplasm of bronchus and lung: Secondary | ICD-10-CM

## 2021-01-28 DIAGNOSIS — I318 Other specified diseases of pericardium: Secondary | ICD-10-CM | POA: Diagnosis not present

## 2021-01-28 DIAGNOSIS — J439 Emphysema, unspecified: Secondary | ICD-10-CM | POA: Diagnosis not present

## 2021-01-28 DIAGNOSIS — F419 Anxiety disorder, unspecified: Secondary | ICD-10-CM | POA: Diagnosis not present

## 2021-01-28 DIAGNOSIS — C3491 Malignant neoplasm of unspecified part of right bronchus or lung: Secondary | ICD-10-CM | POA: Diagnosis not present

## 2021-01-28 DIAGNOSIS — I10 Essential (primary) hypertension: Secondary | ICD-10-CM | POA: Diagnosis not present

## 2021-01-28 DIAGNOSIS — Z902 Acquired absence of lung [part of]: Secondary | ICD-10-CM

## 2021-01-28 DIAGNOSIS — C3490 Malignant neoplasm of unspecified part of unspecified bronchus or lung: Secondary | ICD-10-CM

## 2021-01-28 NOTE — Progress Notes (Signed)
[facility] [phone]   [phone]  OFFICE PROGRESS NOTE  Victoria Anes, MD [address] [place] [address]  DIAGNOSIS: stage IB (T2a, N0, M0) non-small cell lung cancer, adenocarcinoma diagnosed in July 2016  PRIOR THERAPY: 1) Status post right upper lobectomy with lymph node dissection on 07/22/2015 in [place]. 2) Adjuvant systemic chemotherapy with cisplatin and Alimta status post 4 cycles completed in November 2016.  CURRENT THERAPY: Observation.  INTERVAL HISTORY: Victoria Pena 69 y.o. female returns to the clinic today for follow-up visit.  The patient is feeling fine today with no concerning complaints except for occasional pain in the substernal area.  She denied having any shortness of breath, cough or hemoptysis.  She denied having any fever or chills.  She has no nausea, vomiting, diarrhea or constipation.  She denied having any headache or visual changes.  She is here today for evaluation with repeat CT scan of the chest for restaging of her disease.  MEDICAL HISTORY: Past Medical History:  Diagnosis Date  . Adenocarcinoma of right lung, stage 1 ([place]) 07/26/2016  . Anxiety   . COPD (chronic obstructive pulmonary disease) ([place])    pt denies  . Depression   . Hypertension   . Thyroid disease     ALLERGIES:  is allergic to dextromethorphan hbr, keflex [cephalexin], robaxin [methocarbamol], bactrim [sulfamethoxazole-trimethoprim], hydroxyzine, prednisone & diphenhydramine, ciprofloxacin, dextromethorphan polistirex er, meloxicam, nitrofurantoin, diphenhydramine hcl, moxifloxacin, and prednisone.  MEDICATIONS:  Current Outpatient Medications  Medication Sig Dispense Refill  . ALPRAZolam (XANAX) 0.5 MG tablet Take 1 tablet (0.5 mg total) by mouth 2 (two) times daily as needed for anxiety. 60 tablet 3  . amoxicillin (AMOXIL) 500 MG capsule Take 1 capsule (500 mg total) by mouth 2 (two) times  daily. 20 capsule 0  . aspirin 81 MG tablet Take 81 mg by mouth daily.    Marland Kitchen buPROPion (WELLBUTRIN XL) 150 MG 24 hr tablet Take 1 tablet (150 mg total) by mouth daily. 90 tablet 2  . calcium carbonate (TUMS - DOSED IN MG ELEMENTAL CALCIUM) 500 MG chewable tablet Chew 1 tablet by mouth 2 (two) times daily as needed.     Marland Kitchen ibuprofen (ADVIL,MOTRIN) 200 MG tablet Take 600 mg by mouth 2 (two) times daily as needed for mild pain.    Marland Kitchen levothyroxine (SYNTHROID) 137 MCG tablet Take 1 tablet (137 mcg total) by mouth daily. 90 tablet 2  . losartan (COZAAR) 50 MG tablet TAKE 1 TABLET(50 MG) BY MOUTH ONCE DAILY 90 tablet 2  . omeprazole (PRILOSEC) 40 MG capsule TAKE 1 CAPSULE (40 MG) BY ORAL ROUTE ONE TIMES PER DAY BEFORE A MEAL 90 capsule 2  . pregabalin (LYRICA) 150 MG capsule Take 1 capsule (150 mg total) by mouth 2 (two) times daily. 60 capsule 5  . traMADol (ULTRAM) 50 MG tablet Take 1 tablet (50 mg total) by mouth 4 (four) times daily. 120 tablet 2   No current facility-administered medications for this visit.    SURGICAL HISTORY:  Past Surgical History:  Procedure Laterality Date  . ANKLE SURGERY     bilateral  . APPENDECTOMY    . CHOLECYSTECTOMY    . REPLACEMENT TOTAL KNEE Left   . THYROIDECTOMY      REVIEW OF SYSTEMS:  A comprehensive review of systems was negative except for: Respiratory: positive for pleurisy/chest pain   PHYSICAL EXAMINATION: General appearance: alert, cooperative and no distress Head: Normocephalic, without  obvious abnormality, atraumatic Neck: no adenopathy, no JVD, supple, symmetrical, trachea midline and thyroid not enlarged, symmetric, no tenderness/mass/nodules Lymph nodes: Cervical, supraclavicular, and axillary nodes normal. Resp: clear to auscultation bilaterally Back: symmetric, no curvature. ROM normal. No CVA tenderness. Cardio: regular rate and rhythm, S1, S2 normal, no murmur, click, rub or gallop GI: soft, non-tender; bowel sounds normal; no masses,   no organomegaly Extremities: extremities normal, atraumatic, no cyanosis or edema  ECOG PERFORMANCE STATUS: 1 - Symptomatic but completely ambulatory  Blood pressure 119/79, pulse 85, temperature 97.8 F (36.6 C), temperature source Tympanic, resp. rate 18, height 5\' 5"  (1.651 m), weight 187 lb 12.8 oz (85.2 kg), SpO2 98 %.  LABORATORY DATA: Lab Results  Component Value Date   WBC 6.0 01/23/2021   HGB 12.4 01/23/2021   HCT 40.6 01/23/2021   MCV 87.5 01/23/2021   PLT 256 01/23/2021      Chemistry      Component Value Date/Time   NA 140 01/23/2021 1453   NA 141 12/23/2020 1123   NA 140 11/22/2017 1451   K 3.8 01/23/2021 1453   K 3.9 11/22/2017 1451   CL 106 01/23/2021 1453   CO2 26 01/23/2021 1453   CO2 26 11/22/2017 1451   BUN 14 01/23/2021 1453   BUN 15 12/23/2020 1123   BUN 14.5 11/22/2017 1451   CREATININE 0.74 01/23/2021 1453   CREATININE 0.8 11/22/2017 1451      Component Value Date/Time   CALCIUM 8.8 (L) 01/23/2021 1453   CALCIUM 9.1 11/22/2017 1451   ALKPHOS 167 (H) 01/23/2021 1453   ALKPHOS 122 11/22/2017 1451   AST 13 (L) 01/23/2021 1453   AST 13 11/22/2017 1451   ALT 12 01/23/2021 1453   ALT 13 11/22/2017 1451   BILITOT 0.4 01/23/2021 1453   BILITOT 0.48 11/22/2017 1451       RADIOGRAPHIC STUDIES: CT Chest W Contrast  Result Date: 01/25/2021 CLINICAL DATA:  Non-small cell lung cancer staging in a 70 year old female. Post RIGHT upper lobectomy found to have axillary adenopathy on previous imaging EXAM: CT CHEST WITH CONTRAST TECHNIQUE: Multidetector CT imaging of the chest was performed during intravenous contrast administration. CONTRAST:  37mL OMNIPAQUE IOHEXOL 300 MG/ML  SOLN COMPARISON:  January 25, 2020 FINDINGS: Cardiovascular: Scattered aortic atherosclerosis. No aneurysmal dilation. Normal heart size without pericardial effusion. Three-vessel coronary artery disease. Normal caliber central pulmonary vessels, limited assessment on late venous phase.  Mediastinum/Nodes: No thoracic inlet, axillary or mediastinal lymphadenopathy. Scattered small nodes without change. Posterior pericardial cyst measuring 4.6 x 2.5 cm previously 3.9 by 1.8 cm. No internal enhancement this measured approximately 3.6 x 2.1 cm on imaging from 2020 with nearly imperceptible wall. Lungs/Pleura: Pulmonary emphysema. Post RIGHT upper lobectomy. Subtle ground-glass attenuation noted in areas of RIGHT and LEFT lower lobe. These areas show mildly nodular appearance but are predominantly peribronchovascular, for instance on image 60 of series 5 a 7 mm area was not present previously but is contiguous with adjacent areas of ground-glass that extend along bronchovascular structures, also true of ground-glass and nodularity that tracks towards the RIGHT hemidiaphragm with a small nodule just above the RIGHT hemidiaphragm measuring approximately 5 mm (image 91 of series 5) subpleural reticulation and basilar atelectasis in the LEFT chest. Airways are patent. No consolidation or pleural effusion. Upper Abdomen: Incidental imaging of upper abdominal contents with mild intrahepatic biliary duct distension that is unchanged from previous imaging. Limited assessment of liver, pancreas, spleen, adrenal glands and upper portions of LEFT and RIGHT  kidney is unremarkable. Colonic interposition on the under surface of the RIGHT hemidiaphragm anterior to the liver. Musculoskeletal: Spinal degenerative changes. No acute or destructive bone process. IMPRESSION: 1. Post RIGHT upper lobectomy. 2. Subtle ground-glass attenuation in areas of RIGHT and LEFT lower lobe, predominantly peribronchovascular, with a 7 mm area not present previously but is contiguous with adjacent areas of ground-glass and nodularity that tracks towards the RIGHT hemidiaphragm with a small nodule just above the RIGHT hemidiaphragm. Areas favor post infectious changes. Consider three-month follow-up for further assessment. 3. Posterior  pericardial cyst, slightly larger than on imaging from 2020 with nearly imperceptible wall and no signs of nodularity. Given that this was not present on imaging from 2019 perhaps a somehow relates to prior surgery and a small loculated area of pleural fluid developing in this location, post lobectomy. Attention on follow-up. If nodularity develops or there is significant enlargement MRI could potentially be useful for further evaluation. 4. Emphysema and aortic atherosclerosis. Aortic Atherosclerosis (ICD10-I70.0) and Emphysema (ICD10-J43.9). Electronically Signed   By: Zetta Bills M.D.   On: 01/25/2021 21:55    ASSESSMENT AND PLAN:  This is a very pleasant 70 years old white female with a stage IB non-small cell lung cancer, adenocarcinoma status post right upper lobectomy with lymph node dissection followed by 4 cycles of adjuvant systemic chemotherapy with cisplatin and Alimta. The patient is currently on observation since November 2016.  She is feeling fine with no concerning complaints. She had repeat CT scan of the chest that showed no concerning findings for disease recurrence or metastasis but the patient had posterior pericardial cyst that slightly larger than the previous scan. I discussed the scan results with the patient and I recommended for her to get a referral from her primary care physician to cardiology or cardiothoracic surgery for evaluation. Regarding the lung cancer she is currently doing well.  I will see her back for follow-up visit in 1 year for evaluation with repeat CT scan of the chest. She was advised to call immediately if she has any concerning symptoms in the interval. The patient voices understanding of current disease status and treatment options and is in agreement with the current care plan. All questions were answered. The patient knows to call the clinic with any problems, questions or concerns. We can certainly see the patient much sooner if  necessary.  Disclaimer: This note was dictated with voice recognition software. Similar sounding words can inadvertently be transcribed and may not be corrected upon review.

## 2021-02-02 ENCOUNTER — Other Ambulatory Visit: Payer: Self-pay

## 2021-02-02 ENCOUNTER — Encounter: Payer: Self-pay | Admitting: Legal Medicine

## 2021-02-02 ENCOUNTER — Ambulatory Visit (INDEPENDENT_AMBULATORY_CARE_PROVIDER_SITE_OTHER): Payer: Medicare HMO | Admitting: Legal Medicine

## 2021-02-02 DIAGNOSIS — Q248 Other specified congenital malformations of heart: Secondary | ICD-10-CM

## 2021-02-02 HISTORY — DX: Other specified congenital malformations of heart: Q24.8

## 2021-02-02 NOTE — Progress Notes (Signed)
Subjective:  Patient ID: Victoria Pena, female    DOB: [birth year]  Age: 70 y.o. MRN: [id number]  Chief Complaint  Patient presents with  . Discuss CT scan    Patient is here to discuss CT scan that was ordered by Rexford Maus at [facility]. States cyst has grown and is close to her heart and she is concerned if something needs to be done.    HPI: patient had a follow up CT chest  And was noted to have no evidence for cancer recurrence.  She does have a pericardial cyst that has grown somewhat.  No cardiovascular symptoms.   Current Outpatient Medications on File Prior to Visit  Medication Sig Dispense Refill  . ALPRAZolam (XANAX) 0.5 MG tablet Take 1 tablet (0.5 mg total) by mouth 2 (two) times daily as needed for anxiety. 60 tablet 3  . buPROPion (WELLBUTRIN XL) 150 MG 24 hr tablet Take 1 tablet (150 mg total) by mouth daily. 90 tablet 2  . calcium carbonate (TUMS - DOSED IN MG ELEMENTAL CALCIUM) 500 MG chewable tablet Chew 1 tablet by mouth 2 (two) times daily as needed.     Marland Kitchen ibuprofen (ADVIL,MOTRIN) 200 MG tablet Take 600 mg by mouth 2 (two) times daily as needed for mild pain.    Marland Kitchen levothyroxine (SYNTHROID) 137 MCG tablet Take 1 tablet (137 mcg total) by mouth daily. 90 tablet 2  . losartan (COZAAR) 50 MG tablet TAKE 1 TABLET(50 MG) BY MOUTH ONCE DAILY 90 tablet 2  . omeprazole (PRILOSEC) 40 MG capsule TAKE 1 CAPSULE (40 MG) BY ORAL ROUTE ONE TIMES PER DAY BEFORE A MEAL 90 capsule 2  . pregabalin (LYRICA) 150 MG capsule Take 1 capsule (150 mg total) by mouth 2 (two) times daily. 60 capsule 5  . traMADol (ULTRAM) 50 MG tablet Take 1 tablet (50 mg total) by mouth 4 (four) times daily. 120 tablet 2   No current facility-administered medications on file prior to visit.   Past Medical History:  Diagnosis Date  . Adenocarcinoma of right lung, stage 1 ([place]) 07/26/2016  . Anxiety   . COPD (chronic obstructive pulmonary disease) ([place])    pt denies  . Depression   .  Hypertension   . Thyroid disease    Past Surgical History:  Procedure Laterality Date  . ANKLE SURGERY     bilateral  . APPENDECTOMY    . CHOLECYSTECTOMY    . REPLACEMENT TOTAL KNEE Left   . THYROIDECTOMY      Family History  Problem Relation Age of Onset  . Cancer Mother   . Cancer Father   . Cancer Brother    Social History   Socioeconomic History  . Marital status: Widowed    Spouse name: Not on file  . Number of children: Not on file  . Years of education: Not on file  . Highest education level: Not on file  Occupational History  . Not on file  Tobacco Use  . Smoking status: Former Smoker    Packs/day: 1.50    Years: 35.00    Pack years: 52.50    Quit date: 07/27/1999    Years since quitting: 21.5  . Smokeless tobacco: Never Used  Vaping Use  . Vaping Use: Never used  Substance and Sexual Activity  . Alcohol use: No  . Drug use: No  . Sexual activity: Not Currently  Other Topics Concern  . Not on file  Social History Narrative  . Not  on file   Social Determinants of Health   Financial Resource Strain: Not on file  Food Insecurity: No Food Insecurity  . Worried About Charity fundraiser in the Last Year: Never true  . Ran Out of Food in the Last Year: Never true  Transportation Needs: No Transportation Needs  . Lack of Transportation (Medical): No  . Lack of Transportation (Non-Medical): No  Physical Activity: Not on file  Stress: Stress Concern Present  . Feeling of Stress : Very much  Social Connections: Not on file    Review of Systems  Constitutional: Negative.  Negative for appetite change and unexpected weight change.  HENT: Negative.   Eyes: Negative for visual disturbance.  Respiratory: Negative for chest tightness and shortness of breath.   Cardiovascular: Negative for chest pain, palpitations and leg swelling.  Gastrointestinal: Negative for abdominal distention and abdominal pain.  Endocrine: Negative for polyuria.  Genitourinary:  Negative for difficulty urinating, dysuria and urgency.  Musculoskeletal: Negative for arthralgias and back pain.  Skin: Negative.   Neurological: Negative.   Psychiatric/Behavioral: Negative.      Objective:  BP 110/68 (BP Location: Left Arm, Patient Position: Sitting, Cuff Size: Normal)   Pulse 80   Temp 97.8 F (36.6 C) (Temporal)   Resp 16   Ht 5\' 5"  (1.651 m)   Wt 183 lb (83 kg)   LMP  (LMP Unknown)   SpO2 96%   BMI 30.45 kg/m   BP/Weight 02/02/2021 0/05/2375 01/27/3150  Systolic BP 761 607 371  Diastolic BP 68 79 70  Wt. (Lbs) 183 187.8 186  BMI 30.45 31.25 30.95    Physical Exam Cardiovascular:     Rate and Rhythm: Normal rate and regular rhythm.     Pulses: Normal pulses.     Heart sounds: Murmur heard.  No gallop.   Pulmonary:     Effort: Pulmonary effort is normal. No respiratory distress.     Breath sounds: Normal breath sounds. No rales.       Lab Results  Component Value Date   WBC 6.0 01/23/2021   HGB 12.4 01/23/2021   HCT 40.6 01/23/2021   PLT 256 01/23/2021   GLUCOSE 98 01/23/2021   ALT 12 01/23/2021   AST 13 (L) 01/23/2021   NA 140 01/23/2021   K 3.8 01/23/2021   CL 106 01/23/2021   CREATININE 0.74 01/23/2021   BUN 14 01/23/2021   CO2 26 01/23/2021   TSH 10.400 (H) 12/23/2020   INR 0.95 05/03/2018      Assessment & Plan:   Diagnoses and all orders for this visit: Pericardial cyst -     Ambulatory referral to Cardiology I discussed the CT scan and the need to check on pericardial cyst.  She is not having any cardiac embarrassment.     I spent 20 minutes dedicated to the care of this patient on the date of this encounter to include face-to-face time with the patient, as well as: review records  Follow-up: Return if symptoms worsen or fail to improve.  An After Visit Summary was printed and given to the patient.  Reinaldo Meeker, MD [facility] [phone]

## 2021-02-09 ENCOUNTER — Encounter: Payer: Self-pay | Admitting: Nurse Practitioner

## 2021-02-09 ENCOUNTER — Other Ambulatory Visit: Payer: Self-pay

## 2021-02-09 ENCOUNTER — Ambulatory Visit (INDEPENDENT_AMBULATORY_CARE_PROVIDER_SITE_OTHER): Payer: Medicare HMO | Admitting: Nurse Practitioner

## 2021-02-09 VITALS — BP 100/70 | HR 73 | Temp 97.3°F | Ht 65.0 in | Wt 187.0 lb

## 2021-02-09 DIAGNOSIS — Z20818 Contact with and (suspected) exposure to other bacterial communicable diseases: Secondary | ICD-10-CM | POA: Diagnosis not present

## 2021-02-09 DIAGNOSIS — H6123 Impacted cerumen, bilateral: Secondary | ICD-10-CM | POA: Diagnosis not present

## 2021-02-09 NOTE — Progress Notes (Signed)
Acute Office Visit  Subjective:    Patient ID: Victoria Pena, female    DOB: 21-Nov-1951, 70 y.o.   MRN: [id number]  Chief Complaint  Patient presents with  . Ear Fullness    Bilateral ears feels stopped up    HPI Victoria Pena is a 70 year old Caucasian female is in today for bilateral ear fullness and hearing loss. Onset of symptoms was today. Denies treatments. She tells me she has history of small ear canals and right ear surgery in the past. States she has previously had impacted ear cerumen removed. Denies using Q-tips or other instruments into ears. She denies recent air travel, mountain climbing, scuba diving ,or URI/allergic rhinitis.  Pt states grand-daughter and great-grand-daughter that live in her home were recently treated for strep pharyngitis.   Past Medical History:  Diagnosis Date  . Adenocarcinoma of right lung, stage 1 ([place]) 07/26/2016  . Anxiety   . COPD (chronic obstructive pulmonary disease) ([place])    pt denies  . Depression   . Hypertension   . Thyroid disease     Past Surgical History:  Procedure Laterality Date  . ANKLE SURGERY     bilateral  . APPENDECTOMY    . CHOLECYSTECTOMY    . REPLACEMENT TOTAL KNEE Left   . THYROIDECTOMY      Family History  Problem Relation Age of Onset  . Cancer Mother   . Cancer Father   . Cancer Brother     Social History   Socioeconomic History  . Marital status: Widowed    Spouse name: Not on file  . Number of children: Not on file  . Years of education: Not on file  . Highest education level: Not on file  Occupational History  . Not on file  Tobacco Use  . Smoking status: Former Smoker    Packs/day: 1.50    Years: 35.00    Pack years: 52.50    Quit date: 07/27/1999    Years since quitting: 21.5  . Smokeless tobacco: Never Used  Vaping Use  . Vaping Use: Never used  Substance and Sexual Activity  . Alcohol use: No  . Drug use: No  . Sexual activity: Not Currently  Other Topics Concern  . Not on file   Social History Narrative  . Not on file   Social Determinants of Health   Financial Resource Strain: Not on file  Food Insecurity: No Food Insecurity  . Worried About Charity fundraiser in the Last Year: Never true  . Ran Out of Food in the Last Year: Never true  Transportation Needs: No Transportation Needs  . Lack of Transportation (Medical): No  . Lack of Transportation (Non-Medical): No  Physical Activity: Not on file  Stress: Stress Concern Present  . Feeling of Stress : Very much  Social Connections: Not on file  Intimate Partner Violence: Not on file    Outpatient Medications Prior to Visit  Medication Sig Dispense Refill  . ALPRAZolam (XANAX) 0.5 MG tablet Take 1 tablet (0.5 mg total) by mouth 2 (two) times daily as needed for anxiety. 60 tablet 3  . buPROPion (WELLBUTRIN XL) 150 MG 24 hr tablet Take 1 tablet (150 mg total) by mouth daily. 90 tablet 2  . calcium carbonate (TUMS - DOSED IN MG ELEMENTAL CALCIUM) 500 MG chewable tablet Chew 1 tablet by mouth 2 (two) times daily as needed.     Marland Kitchen ibuprofen (ADVIL,MOTRIN) 200 MG tablet Take 600 mg by mouth 2 (two) times  daily as needed for mild pain.    Marland Kitchen levothyroxine (SYNTHROID) 137 MCG tablet Take 1 tablet (137 mcg total) by mouth daily. 90 tablet 2  . losartan (COZAAR) 50 MG tablet TAKE 1 TABLET(50 MG) BY MOUTH ONCE DAILY 90 tablet 2  . omeprazole (PRILOSEC) 40 MG capsule TAKE 1 CAPSULE (40 MG) BY ORAL ROUTE ONE TIMES PER DAY BEFORE A MEAL 90 capsule 2  . pregabalin (LYRICA) 150 MG capsule Take 1 capsule (150 mg total) by mouth 2 (two) times daily. 60 capsule 5  . traMADol (ULTRAM) 50 MG tablet Take 1 tablet (50 mg total) by mouth 4 (four) times daily. 120 tablet 2   No facility-administered medications prior to visit.    Allergies  Allergen Reactions  . Dextromethorphan Hbr Shortness Of Breath  . Keflex [Cephalexin] Shortness Of Breath    Hard time breathing  . Robaxin [Methocarbamol] Nausea Only    Stomach pain .  Could not eat  . Bactrim [Sulfamethoxazole-Trimethoprim] Itching  . Hydroxyzine Other (See Comments) and Swelling    Numbness in lips  . Prednisone & Diphenhydramine Other (See Comments)    Face flushed, heart racing  . Ciprofloxacin   . Dextromethorphan Polistirex Er   . Meloxicam Other (See Comments)    Headache, body aches  . Nitrofurantoin   . Diphenhydramine Hcl Palpitations  . Moxifloxacin Rash  . Prednisone Other (See Comments) and Palpitations    "flushing"     Review of Systems  Constitutional: Negative for fatigue and fever.  HENT: Positive for ear pain (bilateral ear "fullness") and hearing loss (bilateral ears). Negative for congestion, sinus pressure and sore throat.   Eyes: Negative for pain.  Respiratory: Negative for cough, chest tightness, shortness of breath and wheezing.   Cardiovascular: Negative for chest pain and palpitations.  Gastrointestinal: Negative for abdominal pain, constipation, diarrhea, nausea and vomiting.  Genitourinary: Negative for dysuria and hematuria.  Musculoskeletal: Negative for arthralgias, back pain, joint swelling and myalgias.  Skin: Negative for rash.  Neurological: Negative for dizziness, weakness and headaches.  Psychiatric/Behavioral: Negative for dysphoric mood. The patient is not nervous/anxious.        Objective:    Physical Exam Vitals reviewed.  Constitutional:      Appearance: Normal appearance.  HENT:     Head: Normocephalic.     Right Ear: There is impacted cerumen.     Left Ear: There is impacted cerumen.  Cardiovascular:     Rate and Rhythm: Normal rate and regular rhythm.     Pulses: Normal pulses.     Heart sounds: Normal heart sounds.  Pulmonary:     Effort: Pulmonary effort is normal.     Breath sounds: Normal breath sounds.  Skin:    General: Skin is warm and dry.     Capillary Refill: Capillary refill takes less than 2 seconds.  Neurological:     General: No focal deficit present.     Mental  Status: She is alert and oriented to person, place, and time.  Psychiatric:        Mood and Affect: Mood normal.        Behavior: Behavior normal.     BP 100/70 (BP Location: Left Arm, Patient Position: Sitting)   Pulse 73   Temp (!) 97.3 F (36.3 C) (Temporal)   Ht '5\' 5"'  (1.651 m)   Wt 187 lb (84.8 kg)   LMP  (LMP Unknown)   SpO2 94%   BMI 31.12 kg/m  Wt Readings from  Last 3 Encounters:  02/09/21 187 lb (84.8 kg)  02/02/21 183 lb (83 kg)  01/28/21 187 lb 12.8 oz (85.2 kg)    Health Maintenance Due  Topic Date Due  . Hepatitis C Screening  Never done  . TETANUS/TDAP  Never done  . COLONOSCOPY (Pts 45-35yr Insurance coverage will need to be confirmed)  Never done  . COVID-19 Vaccine (3 - Moderna risk 4-dose series) 03/31/2020       Lab Results  Component Value Date   TSH 10.400 (H) 12/23/2020   Lab Results  Component Value Date   WBC 6.0 01/23/2021   HGB 12.4 01/23/2021   HCT 40.6 01/23/2021   MCV 87.5 01/23/2021   PLT 256 01/23/2021   Lab Results  Component Value Date   NA 140 01/23/2021   K 3.8 01/23/2021   CHLORIDE 107 11/22/2017   CO2 26 01/23/2021   GLUCOSE 98 01/23/2021   BUN 14 01/23/2021   CREATININE 0.74 01/23/2021   BILITOT 0.4 01/23/2021   ALKPHOS 167 (H) 01/23/2021   AST 13 (L) 01/23/2021   ALT 12 01/23/2021   PROT 6.8 01/23/2021   ALBUMIN 3.4 (L) 01/23/2021   CALCIUM 8.8 (L) 01/23/2021   ANIONGAP 8 01/23/2021   EGFR >60 11/22/2017        Assessment & Plan:    1. Hearing loss of both ears due to cerumen impaction - EAR CERUMEN REMOVAL -Bilateral ear fullness and decreased hearing subsided after removal of impacted cerumen bilaterally  2. Exposure to Streptococcal pharyngitis - POCT rapid strep A   Debrox to bilateral ear as needed for ear wax build-up  Ear irrigation as needed, kit provided Follow-up as needed   Follow-up: as needed  Signed,  SJerrell Belfast DNP

## 2021-02-09 NOTE — Patient Instructions (Addendum)
Debrox to bilateral ear as needed for ear wax build-up  Ear irrigation as needed, kit provided Follow-up as needed  Earwax Buildup, Adult The ears produce a substance called earwax that helps keep bacteria out of the ear and protects the skin in the ear canal. Occasionally, earwax can build up in the ear and cause discomfort or hearing loss. What are the causes? This condition is caused by a buildup of earwax. Ear canals are self-cleaning. Ear wax is made in the outer part of the ear canal and generally falls out in small amounts over time. When the self-cleaning mechanism is not working, earwax builds up and can cause decreased hearing and discomfort. Attempting to clean ears with cotton swabs can push the earwax deep into the ear canal and cause decreased hearing and pain. What increases the risk? This condition is more likely to develop in people who:  Clean their ears often with cotton swabs.  Pick at their ears.  Use earplugs or in-ear headphones often, or wear hearing aids. The following factors may also make you more likely to develop this condition:  Being female.  Being of older age.  Naturally producing more earwax.  Having narrow ear canals.  Having earwax that is overly thick or sticky.  Having excess hair in the ear canal.  Having eczema.  Being dehydrated. What are the signs or symptoms? Symptoms of this condition include:  Reduced or muffled hearing.  A feeling of fullness in the ear or feeling that the ear is plugged.  Fluid coming from the ear.  Ear pain or an itchy ear.  Ringing in the ear.  Coughing.  Balance problems.  An obvious piece of earwax that can be seen inside the ear canal. How is this diagnosed? This condition may be diagnosed based on:  Your symptoms.  Your medical history.  An ear exam. During the exam, your health care provider will look into your ear with an instrument called an otoscope. You may have tests, including a  hearing test. How is this treated? This condition may be treated by:  Using ear drops to soften the earwax.  Having the earwax removed by a health care provider. The health care provider may: ? Flush the ear with water. ? Use an instrument that has a loop on the end (curette). ? Use a suction device.  Having surgery to remove the wax buildup. This may be done in severe cases. Follow these instructions at home:  Take over-the-counter and prescription medicines only as told by your health care provider.  Do not put any objects, including cotton swabs, into your ear. You can clean the opening of your ear canal with a washcloth or facial tissue.  Follow instructions from your health care provider about cleaning your ears. Do not overclean your ears.  Drink enough fluid to keep your urine pale yellow. This will help to thin the earwax.  Keep all follow-up visits as told. If earwax builds up in your ears often or if you use hearing aids, consider seeing your health care provider for routine, preventive ear cleanings. Ask your health care provider how often you should schedule your cleanings.  If you have hearing aids, clean them according to instructions from the manufacturer and your health care provider.   Contact a health care provider if:  You have ear pain.  You develop a fever.  You have pus or other fluid coming from your ear.  You have hearing loss.  You have ringing in  your ears that does not go away.  You feel like the room is spinning (vertigo).  Your symptoms do not improve with treatment. Get help right away if:  You have bleeding from the affected ear.  You have severe ear pain. Summary  Earwax can build up in the ear and cause discomfort or hearing loss.  The most common symptoms of this condition include reduced or muffled hearing, a feeling of fullness in the ear, or feeling that the ear is plugged.  This condition may be diagnosed based on your symptoms,  your medical history, and an ear exam.  This condition may be treated by using ear drops to soften the earwax or by having the earwax removed by a health care provider.  Do not put any objects, including cotton swabs, into your ear. You can clean the opening of your ear canal with a washcloth or facial tissue. This information is not intended to replace advice given to you by your health care provider. Make sure you discuss any questions you have with your health care provider. Document Revised: 03/25/2020 Document Reviewed: 03/25/2020 Elsevier Patient Education  2021 [place].  Ear Irrigation Ear irrigation is a procedure to wash dirt and wax out of your ear canal. This procedure is also called lavage. You may need ear irrigation if you are having trouble hearing because of a buildup of earwax. You may also have ear irrigation as part of the treatment for an ear infection. Getting wax and dirt out of your ear canal can help ear drops work better. Tell a health care provider about:  Any allergies you have.  All medicines you are taking, including vitamins, herbs, eye drops, creams, and over-the-counter medicines.  Any problems you or family members have had with anesthetic medicines.  Any blood disorders you have.  Any surgeries you have had. This includes any ear surgeries.  Any medical conditions you have.  Whether you are pregnant or may be pregnant. What are the risks? Generally, this is a safe procedure. However, problems may occur, including:  Infection.  Pain.  Hearing loss.  Fluid and debris being pushed through the eardrum and into the middle ear. This can occur if there are holes in the eardrum.  Ear irrigation failing to work. What happens before the procedure?  You will talk with your provider about the procedure and plan.  You may be given ear drops to put in your ear 15-20 minutes before irrigation. This helps loosen the wax. What happens during the  procedure?  A syringe is filled with water or saline solution, which is made of salt and water.  The syringe is gently inserted into the ear canal.  The fluid is used to flush out wax and other debris. The procedure may vary among health care providers and hospitals.   What can I expect after the procedure? After an ear irrigation, follow instructions given to you by your health care provider. Follow these instructions at home: Using ear irrigation kits Ear irrigation kits are available for use at home. Ask your health care provider if this is an option for you. In general, you should:  Use a home irrigation kit only as told by your health care provider.  Read the package instructions carefully.  Follow the directions for using the syringe.  Use water that is room temperature. Do not do ear irrigation at home if you:  Have diabetes. Diabetes increases the risk of infection.  Have a hole or tear in  your eardrum.  Have tubes in your ears.  Have had any ear surgery in the past.  Have been told not to irrigate your ears. Cleaning your ears  Clean the outside of your ear with a soft washcloth daily.  If told by your health care provider, use a few drops of baby oil, mineral oil, glycerin, hydrogen peroxide, or over-the-counter earwax softening drops.  Do not use cotton swabs to clean your ears. These can push wax down into the ear canal.  Do not put anything into your ears to try to remove wax. This includes ear candles.   General instructions  Take over-the-counter and prescription medicines only as told by your health care provider.  If you were prescribed an antibiotic medicine, use it as told by your health care provider. Do not stop using the antibiotic even if your condition improves.  Keep the ear clean and dry by following the instructions from your health care provider.  Keep all follow-up visits. This is important.  Visit your health care provider at least once  a year to have your ears and hearing checked. Contact a health care provider if:  Your hearing is not improving or is getting worse.  You have pain or redness in your ear.  You are dizzy.  You have ringing in your ears.  You have nausea or vomiting.  You have fluid, blood, or pus coming out of your ear. Summary  Ear irrigation is a procedure to wash dirt and wax out of your ear canal. This procedure is also called lavage.  To perform ear irrigation, ear drops may be put in your ear 15-20 minutes before irrigation. Water or saline solution will be used to flush out earwax and other debris.  You may be able to irrigate your ears at home. Ask your health care provider if this is an option for you. Follow your health care provider's instructions.  Clean your ears with a soft cloth after irrigation. Do not use cotton swabs to clean your ears. These can push wax down into the ear canal. This information is not intended to replace advice given to you by your health care provider. Make sure you discuss any questions you have with your health care provider. Document Revised: 03/25/2020 Document Reviewed: 03/25/2020 Elsevier Patient Education  2021 [place]. Carbamide Peroxide ear solution What is this medicine? CARBAMIDE PEROXIDE (CAR bah mide per OX ide) is used to soften and help remove ear wax. This medicine may be used for other purposes; ask your health care provider or pharmacist if you have questions. COMMON BRAND NAME(S): Auro Ear, Auro Earache Relief, Debrox, Ear Drops, Ear Wax Removal, Ear Wax Remover, Earwax Treatment, Murine, Thera-Ear What should I tell my health care provider before I take this medicine? They need to know if you have any of these conditions:  dizziness  ear discharge  ear pain, irritation or rash  infection  perforated eardrum (hole in eardrum)  an unusual or allergic reaction to carbamide peroxide, glycerin, hydrogen peroxide, other medicines,  foods, dyes, or preservatives  pregnant or trying to get pregnant  breast-feeding How should I use this medicine? This medicine is only for use in the outer ear canal. Follow the directions carefully. Wash hands before and after use. The solution may be warmed by holding the bottle in the hand for 1 to 2 minutes. Lie with the affected ear facing upward. Place the proper number of drops into the ear canal. After the drops are instilled,  remain lying with the affected ear upward for 5 minutes to help the drops stay in the ear canal. A cotton ball may be gently inserted at the ear opening for no longer than 5 to 10 minutes to ensure retention. Repeat, if necessary, for the opposite ear. Do not touch the tip of the dropper to the ear, fingertips, or other surface. Do not rinse the dropper after use. Keep container tightly closed. Talk to your pediatrician regarding the use of this medicine in children. While this drug may be used in children as young as 12 years for selected conditions, precautions do apply. Overdosage: If you think you have taken too much of this medicine contact a poison control center or emergency room at once. NOTE: This medicine is only for you. Do not share this medicine with others. What if I miss a dose? If you miss a dose, use it as soon as you can. If it is almost time for your next dose, use only that dose. Do not use double or extra doses. What may interact with this medicine? Interactions are not expected. Do not use any other ear products without asking your doctor or health care professional. This list may not describe all possible interactions. Give your health care provider a list of all the medicines, herbs, non-prescription drugs, or dietary supplements you use. Also tell them if you smoke, drink alcohol, or use illegal drugs. Some items may interact with your medicine. What should I watch for while using this medicine? This medicine is not for long-term use. Do not  use for more than 4 days without checking with your health care professional. Contact your doctor or health care professional if your condition does not start to get better within a few days or if you notice burning, redness, itching or swelling. What side effects may I notice from receiving this medicine? Side effects that you should report to your doctor or health care professional as soon as possible:  allergic reactions like skin rash, itching or hives, swelling of the face, lips, or tongue  burning, itching, and redness  worsening ear pain  rash Side effects that usually do not require medical attention (report to your doctor or health care professional if they continue or are bothersome):  abnormal sensation while putting the drops in the ear  temporary reduction in hearing (but not complete loss of hearing) This list may not describe all possible side effects. Call your doctor for medical advice about side effects. You may report side effects to FDA at 1-800-FDA-1088. Where should I keep my medicine? Keep out of the reach of children. Store at room temperature between 15 and 30 degrees C (59 and 86 degrees F) in a tight, light-resistant container. Keep bottle away from excessive heat and direct sunlight. Throw away any unused medicine after the expiration date. NOTE: This sheet is a summary. It may not cover all possible information. If you have questions about this medicine, talk to your doctor, pharmacist, or health care provider.  2021 Elsevier/Gold Standard (2008-03-19 14:00:02)  Ear Irrigation Ear irrigation is a procedure to wash dirt and wax out of your ear canal. This procedure is also called lavage. You may need ear irrigation if you are having trouble hearing because of a buildup of earwax. You may also have ear irrigation as part of the treatment for an ear infection. Getting wax and dirt out of your ear canal can help ear drops work better. Tell a health care provider  about:  Any allergies you have.  All medicines you are taking, including vitamins, herbs, eye drops, creams, and over-the-counter medicines.  Any problems you or family members have had with anesthetic medicines.  Any blood disorders you have.  Any surgeries you have had. This includes any ear surgeries.  Any medical conditions you have.  Whether you are pregnant or may be pregnant. What are the risks? Generally, this is a safe procedure. However, problems may occur, including:  Infection.  Pain.  Hearing loss.  Fluid and debris being pushed through the eardrum and into the middle ear. This can occur if there are holes in the eardrum.  Ear irrigation failing to work. What happens before the procedure?  You will talk with your provider about the procedure and plan.  You may be given ear drops to put in your ear 15-20 minutes before irrigation. This helps loosen the wax. What happens during the procedure?  A syringe is filled with water or saline solution, which is made of salt and water.  The syringe is gently inserted into the ear canal.  The fluid is used to flush out wax and other debris. The procedure may vary among health care providers and hospitals.   What can I expect after the procedure? After an ear irrigation, follow instructions given to you by your health care provider. Follow these instructions at home: Using ear irrigation kits Ear irrigation kits are available for use at home. Ask your health care provider if this is an option for you. In general, you should:  Use a home irrigation kit only as told by your health care provider.  Read the package instructions carefully.  Follow the directions for using the syringe.  Use water that is room temperature. Do not do ear irrigation at home if you:  Have diabetes. Diabetes increases the risk of infection.  Have a hole or tear in your eardrum.  Have tubes in your ears.  Have had any ear surgery in  the past.  Have been told not to irrigate your ears. Cleaning your ears  Clean the outside of your ear with a soft washcloth daily.  If told by your health care provider, use a few drops of baby oil, mineral oil, glycerin, hydrogen peroxide, or over-the-counter earwax softening drops.  Do not use cotton swabs to clean your ears. These can push wax down into the ear canal.  Do not put anything into your ears to try to remove wax. This includes ear candles.   General instructions  Take over-the-counter and prescription medicines only as told by your health care provider.  If you were prescribed an antibiotic medicine, use it as told by your health care provider. Do not stop using the antibiotic even if your condition improves.  Keep the ear clean and dry by following the instructions from your health care provider.  Keep all follow-up visits. This is important.  Visit your health care provider at least once a year to have your ears and hearing checked. Contact a health care provider if:  Your hearing is not improving or is getting worse.  You have pain or redness in your ear.  You are dizzy.  You have ringing in your ears.  You have nausea or vomiting.  You have fluid, blood, or pus coming out of your ear. Summary  Ear irrigation is a procedure to wash dirt and wax out of your ear canal. This procedure is also called lavage.  To perform ear irrigation, ear drops may  be put in your ear 15-20 minutes before irrigation. Water or saline solution will be used to flush out earwax and other debris.  You may be able to irrigate your ears at home. Ask your health care provider if this is an option for you. Follow your health care provider's instructions.  Clean your ears with a soft cloth after irrigation. Do not use cotton swabs to clean your ears. These can push wax down into the ear canal. This information is not intended to replace advice given to you by your health care  provider. Make sure you discuss any questions you have with your health care provider. Document Revised: 03/25/2020 Document Reviewed: 03/25/2020 Elsevier Patient Education  [place].

## 2021-02-10 ENCOUNTER — Ambulatory Visit (INDEPENDENT_AMBULATORY_CARE_PROVIDER_SITE_OTHER): Payer: Medicare HMO

## 2021-02-10 DIAGNOSIS — Z23 Encounter for immunization: Secondary | ICD-10-CM | POA: Diagnosis not present

## 2021-02-10 LAB — POCT RAPID STREP A (OFFICE): Rapid Strep A Screen: NEGATIVE

## 2021-02-10 NOTE — Progress Notes (Signed)
   [facility]  Name:  Victoria Pena    MRN: [id number] DOB: 04-27-1951  02/10/2021  Victoria Pena was observed post Covid-19 immunization for 15 minutes without incident. She was provided with Vaccine Information Sheet and instruction to access the V-Safe system.   Victoria Pena was instructed to call 911 with any severe reactions post vaccine: Marland Kitchen Difficulty breathing  . Swelling of face and throat  . A fast heartbeat  . A bad rash all over body  . Dizziness and weakness   Immunizations Administered    Name Date Dose VIS Date Route   Moderna Covid-19 Booster Vaccine 02/10/2021  1:30 PM 0.25 mL 10/08/2020 Intramuscular   Manufacturer: Moderna   Lot: [id number]   [place]: [id number]

## 2021-02-19 ENCOUNTER — Ambulatory Visit: Payer: Medicare HMO | Admitting: Cardiology

## 2021-02-20 DIAGNOSIS — J449 Chronic obstructive pulmonary disease, unspecified: Secondary | ICD-10-CM | POA: Insufficient documentation

## 2021-02-20 DIAGNOSIS — E079 Disorder of thyroid, unspecified: Secondary | ICD-10-CM | POA: Insufficient documentation

## 2021-02-20 DIAGNOSIS — I10 Essential (primary) hypertension: Secondary | ICD-10-CM | POA: Insufficient documentation

## 2021-02-23 ENCOUNTER — Ambulatory Visit: Payer: Medicare HMO | Admitting: Cardiology

## 2021-02-23 ENCOUNTER — Other Ambulatory Visit: Payer: Self-pay

## 2021-02-23 ENCOUNTER — Encounter: Payer: Self-pay | Admitting: Cardiology

## 2021-02-23 VITALS — BP 105/71 | HR 77 | Ht 65.0 in | Wt 187.8 lb

## 2021-02-23 DIAGNOSIS — I10 Essential (primary) hypertension: Secondary | ICD-10-CM

## 2021-02-23 DIAGNOSIS — Q248 Other specified congenital malformations of heart: Secondary | ICD-10-CM

## 2021-02-23 NOTE — Progress Notes (Signed)
Cardiology Office Note:    Date:  02/23/2021   ID:  Victoria Pena, DOB [birth year], MRN [id number]  PCP:  Lillard Anes, MD  Cardiologist:  No primary care provider on file.  Electrophysiologist:  None   Referring MD: Lillard Anes   I was recently diagnosed with a pericardial cyst  History of Present Illness:    Victoria Pena is a 70 y.o. female with a hx of adenocarcinoma of the right lung stage I, benign essential hypertension, hypertension, recently diagnosed pericardial cyst seen on the CT scan which was done in 01/25/2020.  The patient is here today as she was referred by her PCP giving the pericardial cyst.  She denies any shortness of breath, any chest pain, lightheadedness and dizziness.  Past Medical History:  Diagnosis Date  . Adenocarcinoma of right lung, stage 1 ([place]) 07/26/2016  . Anxiety   . Benign essential hypertension 12/27/2016  . COPD (chronic obstructive pulmonary disease) ([place])    pt denies  . Depression   . Hypertension   . Pericardial cyst 02/02/2021  . Thyroid disease     Past Surgical History:  Procedure Laterality Date  . ANKLE SURGERY     bilateral  . APPENDECTOMY    . CHOLECYSTECTOMY    . REPLACEMENT TOTAL KNEE Left   . THYROIDECTOMY      Current Medications: Current Meds  Medication Sig  . ALPRAZolam (XANAX) 0.5 MG tablet Take 1 tablet (0.5 mg total) by mouth 2 (two) times daily as needed for anxiety.  Marland Kitchen buPROPion (WELLBUTRIN XL) 150 MG 24 hr tablet Take 1 tablet (150 mg total) by mouth daily.  . calcium carbonate (TUMS - DOSED IN MG ELEMENTAL CALCIUM) 500 MG chewable tablet Chew 1 tablet by mouth 2 (two) times daily as needed.   . Ferrous Sulfate Dried (FERROUS SULFATE IRON PO) Take 325 mg by mouth daily.  Marland Kitchen ibuprofen (ADVIL,MOTRIN) 200 MG tablet Take 600 mg by mouth 2 (two) times daily as needed for mild pain.  Marland Kitchen levothyroxine (SYNTHROID) 137 MCG tablet Take 1 tablet (137 mcg total) by mouth daily.  Marland Kitchen losartan (COZAAR)  50 MG tablet TAKE 1 TABLET(50 MG) BY MOUTH ONCE DAILY  . omeprazole (PRILOSEC) 40 MG capsule TAKE 1 CAPSULE (40 MG) BY ORAL ROUTE ONE TIMES PER DAY BEFORE A MEAL  . pregabalin (LYRICA) 150 MG capsule Take 1 capsule (150 mg total) by mouth 2 (two) times daily.  . traMADol (ULTRAM) 50 MG tablet Take 1 tablet (50 mg total) by mouth 4 (four) times daily.     Allergies:   Dextromethorphan hbr, Keflex [cephalexin], Robaxin [methocarbamol], Bactrim [sulfamethoxazole-trimethoprim], Hydroxyzine, Prednisone & diphenhydramine, Ciprofloxacin, Dextromethorphan polistirex er, Meloxicam, Nitrofurantoin, Diphenhydramine hcl, Moxifloxacin, and Prednisone   Social History   Socioeconomic History  . Marital status: Widowed    Spouse name: Not on file  . Number of children: Not on file  . Years of education: Not on file  . Highest education level: Not on file  Occupational History  . Not on file  Tobacco Use  . Smoking status: Former Smoker    Packs/day: 1.50    Years: 35.00    Pack years: 52.50    Quit date: 07/27/1999    Years since quitting: 21.5  . Smokeless tobacco: Never Used  Vaping Use  . Vaping Use: Never used  Substance and Sexual Activity  . Alcohol use: No  . Drug use: No  . Sexual activity: Not Currently  Other Topics Concern  .  Not on file  Social History Narrative  . Not on file   Social Determinants of Health   Financial Resource Strain: Not on file  Food Insecurity: No Food Insecurity  . Worried About Charity fundraiser in the Last Year: Never true  . Ran Out of Food in the Last Year: Never true  Transportation Needs: No Transportation Needs  . Lack of Transportation (Medical): No  . Lack of Transportation (Non-Medical): No  Physical Activity: Not on file  Stress: Stress Concern Present  . Feeling of Stress : Very much  Social Connections: Not on file     Family History: The patient's family history includes Cancer in her brother, father, and mother.  ROS:   Review  of Systems  Constitution: Negative for decreased appetite, fever and weight gain.  HENT: Negative for congestion, ear discharge, hoarse voice and sore throat.   Eyes: Negative for discharge, redness, vision loss in right eye and visual halos.  Cardiovascular: Negative for chest pain, dyspnea on exertion, leg swelling, orthopnea and palpitations.  Respiratory: Negative for cough, hemoptysis, shortness of breath and snoring.   Endocrine: Negative for heat intolerance and polyphagia.  Hematologic/Lymphatic: Negative for bleeding problem. Does not bruise/bleed easily.  Skin: Negative for flushing, nail changes, rash and suspicious lesions.  Musculoskeletal: Negative for arthritis, joint pain, muscle cramps, myalgias, neck pain and stiffness.  Gastrointestinal: Negative for abdominal pain, bowel incontinence, diarrhea and excessive appetite.  Genitourinary: Negative for decreased libido, genital sores and incomplete emptying.  Neurological: Negative for brief paralysis, focal weakness, headaches and loss of balance.  Psychiatric/Behavioral: Negative for altered mental status, depression and suicidal ideas.  Allergic/Immunologic: Negative for HIV exposure and persistent infections.    EKGs/Labs/Other Studies Reviewed:    The following studies were reviewed today:   EKG:  The ekg ordered today demonstrates sinus rhythm, heart rate 97 bpm with precordial leads progression suggestive of old anterior infarction.  EXAM: 01/24/2021 CT CHEST WITH CONTRAST  TECHNIQUE: Multidetector CT imaging of the chest was performed during intravenous contrast administration.  CONTRAST:  4mL OMNIPAQUE IOHEXOL 300 MG/ML  SOLN  COMPARISON:  January 25, 2020  FINDINGS: Cardiovascular: Scattered aortic atherosclerosis. No aneurysmal dilation. Normal heart size without pericardial effusion. Three-vessel coronary artery disease. Normal caliber central pulmonary vessels, limited assessment on late venous  phase.  Mediastinum/Nodes: No thoracic inlet, axillary or mediastinal lymphadenopathy. Scattered small nodes without change. Posterior pericardial cyst measuring 4.6 x 2.5 cm previously 3.9 by 1.8 cm. No internal enhancement this measured approximately 3.6 x 2.1 cm on imaging from 2020 with nearly imperceptible wall.  Lungs/Pleura: Pulmonary emphysema. Post RIGHT upper lobectomy. Subtle ground-glass attenuation noted in areas of RIGHT and LEFT lower lobe. These areas show mildly nodular appearance but are predominantly peribronchovascular, for instance on image 60 of series 5 a 7 mm area was not present previously but is contiguous with adjacent areas of ground-glass that extend along bronchovascular structures, also true of ground-glass and nodularity that tracks towards the RIGHT hemidiaphragm with a small nodule just above the RIGHT hemidiaphragm measuring approximately 5 mm (image 91 of series 5) subpleural reticulation and basilar atelectasis in the LEFT chest. Airways are patent. No consolidation or pleural effusion.  Upper Abdomen: Incidental imaging of upper abdominal contents with mild intrahepatic biliary duct distension that is unchanged from previous imaging. Limited assessment of liver, pancreas, spleen, adrenal glands and upper portions of LEFT and RIGHT kidney is unremarkable.  Colonic interposition on the under surface of the RIGHT hemidiaphragm anterior  to the liver.  Musculoskeletal: Spinal degenerative changes. No acute or destructive bone process.  IMPRESSION: 1. Post RIGHT upper lobectomy. 2. Subtle ground-glass attenuation in areas of RIGHT and LEFT lower lobe, predominantly peribronchovascular, with a 7 mm area not present previously but is contiguous with adjacent areas of ground-glass and nodularity that tracks towards the RIGHT hemidiaphragm with a small nodule just above the RIGHT hemidiaphragm. Areas favor post infectious changes.  Consider three-month follow-up for further assessment. 3. Posterior pericardial cyst, slightly larger than on imaging from 2020 with nearly imperceptible wall and no signs of nodularity. Given that this was not present on imaging from 2019 perhaps a somehow relates to prior surgery and a small loculated area of pleural fluid developing in this location, post lobectomy. Attention on follow-up. If nodularity develops or there is significant enlargement MRI could potentially be useful for further evaluation. 4. Emphysema and aortic atherosclerosis.  Aortic Atherosclerosis (ICD10-I70.0) and Emphysema (ICD10-J43.9).    Recent Labs: 12/23/2020: TSH 10.400 01/23/2021: ALT 12; BUN 14; Creatinine 0.74; Hemoglobin 12.4; Platelet Count 256; Potassium 3.8; Sodium 140  Recent Lipid Panel No results found for: CHOL, TRIG, HDL, CHOLHDL, VLDL, LDLCALC, LDLDIRECT  Physical Exam:    VS:  BP 105/71 (BP Location: Right Arm)   Pulse 77   Ht 5\' 5"  (1.651 m)   Wt 187 lb 12.8 oz (85.2 kg)   LMP  (LMP Unknown)   SpO2 97%   BMI 31.25 kg/m     Wt Readings from Last 3 Encounters:  02/23/21 187 lb 12.8 oz (85.2 kg)  02/09/21 187 lb (84.8 kg)  02/02/21 183 lb (83 kg)     GEN: Well nourished, well developed in no acute distress HEENT: Normal NECK: No JVD; No carotid bruits LYMPHATICS: No lymphadenopathy CARDIAC: S1S2 noted,RRR, no murmurs, rubs, gallops RESPIRATORY:  Clear to auscultation without rales, wheezing or rhonchi  ABDOMEN: Soft, non-tender, non-distended, +bowel sounds, no guarding. EXTREMITIES: No edema, No cyanosis, no clubbing MUSCULOSKELETAL:  No deformity  SKIN: Warm and dry NEUROLOGIC:  Alert and oriented x 3, non-focal PSYCHIATRIC:  Normal affect, good insight  ASSESSMENT:    1. Pericardial cyst   2. Hypertension, unspecified type   3. Benign essential hypertension    PLAN:    We talked about the findings of her pericardia cyst.  Thankfully she has no hemodynamic  instability.  I am going to place order for a cardiac MRI.  I also referred the patient to the CT surgery colleagues.  No anginal symptoms or any shortness of breath at this time we will continue to monitor.  Blood pressure is acceptable, continue with current antihypertensive regimen.  The patient is in agreement with the above plan. The patient left the office in stable condition.  The patient will follow up in   Medication Adjustments/Labs and Tests Ordered: Current medicines are reviewed at length with the patient today.  Concerns regarding medicines are outlined above.  Orders Placed This Encounter  Procedures  . EKG 12-Lead  . ECHOCARDIOGRAM COMPLETE   No orders of the defined types were placed in this encounter.   Patient Instructions  Medication Instructions:  Your physician recommends that you continue on your current medications as directed. Please refer to the Current Medication list given to you today.  *If you need a refill on your cardiac medications before your next appointment, please call your pharmacy*   Lab Work: None If you have labs (blood work) drawn today and your tests are completely normal, you will  receive your results only by: Marland Kitchen MyChart Message (if you have MyChart) OR . A paper copy in the mail If you have any lab test that is abnormal or we need to change your treatment, we will call you to review the results.   Testing/Procedures: Your physician has requested that you have an echocardiogram. Echocardiography is a painless test that uses sound waves to create images of your heart. It provides your doctor with information about the size and shape of your heart and how well your heart's chambers and valves are working. This procedure takes approximately one hour. There are no restrictions for this procedure.    Follow-Up: At [facility], you and your health needs are our priority.  As part of our continuing mission to provide you with exceptional  heart care, we have created designated Provider Care Teams.  These Care Teams include your primary Cardiologist (physician) and Advanced Practice Providers (APPs -  Physician Assistants and Nurse Practitioners) who all work together to provide you with the care you need, when you need it.  We recommend signing up for the patient portal called "MyChart".  Sign up information is provided on this After Visit Summary.  MyChart is used to connect with patients for Virtual Visits (Telemedicine).  Patients are able to view lab/test results, encounter notes, upcoming appointments, etc.  Non-urgent messages can be sent to your provider as well.   To learn more about what you can do with MyChart, go to [email or web address].    Your next appointment:   6 month(s)  The format for your next appointment:   In Person  Provider:   Berniece Salines, DO   Other Instructions     Adopting a Healthy Lifestyle.  Know what a healthy weight is for you (roughly BMI <25) and aim to maintain this   Aim for 7+ servings of fruits and vegetables daily   65-80+ fluid ounces of water or unsweet tea for healthy kidneys   Limit to max 1 drink of alcohol per day; avoid smoking/tobacco   Limit animal fats in diet for cholesterol and heart health - choose grass fed whenever available   Avoid highly processed foods, and foods high in saturated/trans fats   Aim for low stress - take time to unwind and care for your mental health   Aim for 150 min of moderate intensity exercise weekly for heart health, and weights twice weekly for bone health   Aim for 7-9 hours of sleep daily   When it comes to diets, agreement about the perfect plan isnt easy to find, even among the experts. Experts at the [employer] developed an idea known as the Healthy Eating Plate. Just imagine a plate divided into logical, healthy portions.   The emphasis is on diet quality:   Load up on vegetables and fruits -  one-half of your plate: Aim for color and variety, and remember that potatoes dont count.   Go for whole grains - one-quarter of your plate: Whole wheat, barley, wheat berries, quinoa, oats, brown rice, and foods made with them. If you want pasta, go with whole wheat pasta.   Protein power - one-quarter of your plate: Fish, chicken, beans, and nuts are all healthy, versatile protein sources. Limit red meat.   The diet, however, does go beyond the plate, offering a few other suggestions.   Use healthy plant oils, such as olive, canola, soy, corn, sunflower and peanut. Check the labels, and avoid partially hydrogenated oil,  which have unhealthy trans fats.   If youre thirsty, drink water. Coffee and tea are good in moderation, but skip sugary drinks and limit milk and dairy products to one or two daily servings.   The type of carbohydrate in the diet is more important than the amount. Some sources of carbohydrates, such as vegetables, fruits, whole grains, and beans-are healthier than others.   Finally, stay active  Signed, Berniece Salines, DO  02/23/2021 4:39 PM    [facility]

## 2021-02-23 NOTE — Patient Instructions (Addendum)
Medication Instructions:  Your physician recommends that you continue on your current medications as directed. Please refer to the Current Medication list given to you today.  *If you need a refill on your cardiac medications before your next appointment, please call your pharmacy*   Lab Work: None If you have labs (blood work) drawn today and your tests are completely normal, you will receive your results only by: Marland Kitchen MyChart Message (if you have MyChart) OR . A paper copy in the mail If you have any lab test that is abnormal or we need to change your treatment, we will call you to review the results.   Testing/Procedures: You will be sechduled for Cardiac MRI. Please arrive at the [facility] main entrance of [facility]  30-45 minutes prior to test start time.  [facility]  [address]  [place], [place] [address]  [phone]  Proceed to the [facility] Radiology Department (First Floor).  ?  Magnetic resonance imaging (MRI) is a painless test that produces images of the inside of the body without using X-rays. During an MRI, strong magnets and radio waves work together in a [employer], political party to form detailed images. MRI images may provide more details about a medical condition than X-rays, CT scans, and ultrasounds can provide.  You may be given earphones to listen for instructions.  You may eat a light breakfast and take medications as ordered. If a contrast material will be used, an IV will be inserted into one of your veins. Contrast material will be injected into your IV.  You will be asked to remove all metal, including: Watch, jewelry, and other metal objects including hearing aids, hair pieces and dentures. (Braces and fillings normally are not a problem.)  If contrast material was used:  It will leave your body through your urine within a day. You may be told to drink plenty of fluids to help flush the contrast material out of your system.  TEST WILL TAKE  APPROXIMATELY 1 HOUR  PLEASE NOTIFY SCHEDULING AT LEAST 24 HOURS IN ADVANCE IF YOU ARE UNABLE TO KEEP YOUR APPOINTMENT.     Follow-Up: At [facility], you and your health needs are our priority.  As part of our continuing mission to provide you with exceptional heart care, we have created designated Provider Care Teams.  These Care Teams include your primary Cardiologist (physician) and Advanced Practice Providers (APPs -  Physician Assistants and Nurse Practitioners) who all work together to provide you with the care you need, when you need it.  We recommend signing up for the patient portal called "MyChart".  Sign up information is provided on this After Visit Summary.  MyChart is used to connect with patients for Virtual Visits (Telemedicine).  Patients are able to view lab/test results, encounter notes, upcoming appointments, etc.  Non-urgent messages can be sent to your provider as well.   To learn more about what you can do with MyChart, go to [email or web address].    Your next appointment:   6 month(s)  The format for your next appointment:   In Person  Provider:   Berniece Salines, DO   Other Instructions

## 2021-02-24 ENCOUNTER — Telehealth: Payer: Self-pay | Admitting: Cardiology

## 2021-02-24 NOTE — Telephone Encounter (Signed)
Spoke with patient regarding preferred weekdays and times for scheduling the Cardiac MRI ordered by Dr. Harriet Masson.  Informed patient as soon as we hear regarding the insurance prior authorization, I will be in touch with her appointment.  She voiced her understanding.

## 2021-02-25 ENCOUNTER — Encounter: Payer: Self-pay | Admitting: Cardiology

## 2021-02-25 NOTE — Telephone Encounter (Signed)
Left message for patient regarding the Tuesday 03/24/21 8:00 am Cardiac MRI appointment at [facility]---arrival time is 7:30 am--1st floor admissions office.  Will mail information to patient and it it available in My Chart.  Requested patient call with questions or concerns

## 2021-03-08 DIAGNOSIS — R051 Acute cough: Secondary | ICD-10-CM | POA: Diagnosis not present

## 2021-03-08 DIAGNOSIS — J209 Acute bronchitis, unspecified: Secondary | ICD-10-CM | POA: Diagnosis not present

## 2021-03-08 DIAGNOSIS — Z20828 Contact with and (suspected) exposure to other viral communicable diseases: Secondary | ICD-10-CM | POA: Diagnosis not present

## 2021-03-12 ENCOUNTER — Other Ambulatory Visit: Payer: Self-pay

## 2021-03-12 ENCOUNTER — Ambulatory Visit (INDEPENDENT_AMBULATORY_CARE_PROVIDER_SITE_OTHER): Payer: Medicare HMO | Admitting: Legal Medicine

## 2021-03-12 ENCOUNTER — Encounter: Payer: Self-pay | Admitting: Legal Medicine

## 2021-03-12 DIAGNOSIS — J42 Unspecified chronic bronchitis: Secondary | ICD-10-CM | POA: Diagnosis not present

## 2021-03-12 DIAGNOSIS — J410 Simple chronic bronchitis: Secondary | ICD-10-CM | POA: Diagnosis not present

## 2021-03-12 DIAGNOSIS — J441 Chronic obstructive pulmonary disease with (acute) exacerbation: Secondary | ICD-10-CM | POA: Diagnosis not present

## 2021-03-12 MED ORDER — ALBUTEROL SULFATE HFA 108 (90 BASE) MCG/ACT IN AERS: 2.0000 | INHALATION_SPRAY | Freq: Four times a day (QID) | RESPIRATORY_TRACT | 2 refills | Status: DC | PRN

## 2021-03-12 MED ORDER — CLARITHROMYCIN 500 MG PO TABS: 500.0000 mg | ORAL_TABLET | Freq: Two times a day (BID) | ORAL | 0 refills | Status: DC

## 2021-03-12 MED ORDER — AZITHROMYCIN 250 MG PO TABS: ORAL_TABLET | ORAL | 0 refills | Status: DC

## 2021-03-12 MED ORDER — TRIAMCINOLONE ACETONIDE 40 MG/ML IJ SUSP
80.0000 mg | Freq: Once | INTRAMUSCULAR | Status: AC
  Administered 2021-03-12: 80 mg via INTRAMUSCULAR

## 2021-03-12 NOTE — Progress Notes (Signed)
Subjective:  Patient ID: Victoria Pena, female    DOB: 26-Dec-1950  Age: 70 y.o. MRN: [id number]  Chief Complaint  Patient presents with  . Sore Throat    On 03/17 last week throat was sore and burning. Covid test was negative on Sunday and chest x-ray was clear at urgent care. Patient states she has been wheezing and coughing, having trouble sleeping at night.    HPI: patient is having sore throat for one week.  She went to urgent care with no treatment.  She still has sore throat. She is now wheezing and coughing.  Sleeping on 2 pillows.   Current Outpatient Medications on File Prior to Visit  Medication Sig Dispense Refill  . ALPRAZolam (XANAX) 0.5 MG tablet Take 1 tablet (0.5 mg total) by mouth 2 (two) times daily as needed for anxiety. 60 tablet 3  . buPROPion (WELLBUTRIN XL) 150 MG 24 hr tablet Take 1 tablet (150 mg total) by mouth daily. 90 tablet 2  . calcium carbonate (TUMS - DOSED IN MG ELEMENTAL CALCIUM) 500 MG chewable tablet Chew 1 tablet by mouth 2 (two) times daily as needed.     . Ferrous Sulfate Dried (FERROUS SULFATE IRON PO) Take 325 mg by mouth daily.    Marland Kitchen ibuprofen (ADVIL,MOTRIN) 200 MG tablet Take 600 mg by mouth 2 (two) times daily as needed for mild pain.    Marland Kitchen levothyroxine (SYNTHROID) 137 MCG tablet Take 1 tablet (137 mcg total) by mouth daily. 90 tablet 2  . losartan (COZAAR) 50 MG tablet TAKE 1 TABLET(50 MG) BY MOUTH ONCE DAILY 90 tablet 2  . omeprazole (PRILOSEC) 40 MG capsule TAKE 1 CAPSULE (40 MG) BY ORAL ROUTE ONE TIMES PER DAY BEFORE A MEAL 90 capsule 2  . pregabalin (LYRICA) 150 MG capsule Take 1 capsule (150 mg total) by mouth 2 (two) times daily. 60 capsule 5  . traMADol (ULTRAM) 50 MG tablet Take 1 tablet (50 mg total) by mouth 4 (four) times daily. 120 tablet 2   No current facility-administered medications on file prior to visit.   Past Medical History:  Diagnosis Date  . Adenocarcinoma of right lung, stage 1 ([place]) 07/26/2016  . Anxiety   . Benign  essential hypertension 12/27/2016  . COPD (chronic obstructive pulmonary disease) ([place])    pt denies  . Depression   . Hypertension   . Pericardial cyst 02/02/2021  . Thyroid disease    Past Surgical History:  Procedure Laterality Date  . ANKLE SURGERY     bilateral  . APPENDECTOMY    . CHOLECYSTECTOMY    . REPLACEMENT TOTAL KNEE Left   . THYROIDECTOMY      Family History  Problem Relation Age of Onset  . Cancer Mother   . Cancer Father   . Cancer Brother    Social History   Socioeconomic History  . Marital status: Widowed    Spouse name: Not on file  . Number of children: Not on file  . Years of education: Not on file  . Highest education level: Not on file  Occupational History  . Not on file  Tobacco Use  . Smoking status: Former Smoker    Packs/day: 1.50    Years: 35.00    Pack years: 52.50    Quit date: 07/27/1999    Years since quitting: 21.6  . Smokeless tobacco: Never Used  Vaping Use  . Vaping Use: Never used  Substance and Sexual Activity  . Alcohol use: No  .  Drug use: No  . Sexual activity: Not Currently  Other Topics Concern  . Not on file  Social History Narrative  . Not on file   Social Determinants of Health   Financial Resource Strain: Not on file  Food Insecurity: No Food Insecurity  . Worried About Charity fundraiser in the Last Year: Never true  . Ran Out of Food in the Last Year: Never true  Transportation Needs: No Transportation Needs  . Lack of Transportation (Medical): No  . Lack of Transportation (Non-Medical): No  Physical Activity: Not on file  Stress: Stress Concern Present  . Feeling of Stress : Very much  Social Connections: Not on file    Review of Systems  Constitutional: Negative for activity change and appetite change.  HENT: Negative.   Eyes: Negative for visual disturbance.  Respiratory: Positive for cough, shortness of breath and wheezing.   Cardiovascular: Negative for chest pain, palpitations and leg swelling.   Gastrointestinal: Negative.  Negative for abdominal distention and abdominal pain.  Endocrine: Negative for polyuria.  Genitourinary: Negative for difficulty urinating and urgency.  Musculoskeletal: Negative for arthralgias and back pain.  Skin: Negative.   Neurological: Negative.   Psychiatric/Behavioral: Negative.      Objective:  BP 110/68 (BP Location: Left Arm, Patient Position: Sitting, Cuff Size: Normal)   Pulse 86   Temp (!) 97.4 F (36.3 C) (Temporal)   Resp 16   Ht 5\' 5"  (1.651 m)   Wt 187 lb 6.4 oz (85 kg)   LMP  (LMP Unknown)   SpO2 93%   BMI 31.18 kg/m   BP/Weight 03/12/2021 02/23/2021 03/28/8118  Systolic BP 147 829 562  Diastolic BP 68 71 70  Wt. (Lbs) 187.4 187.8 187  BMI 31.18 31.25 31.12    Physical Exam Vitals reviewed.  Constitutional:      Appearance: She is well-developed and normal weight.  HENT:     Head: Normocephalic.     Right Ear: Tympanic membrane normal.     Left Ear: Tympanic membrane normal.     Nose: Congestion and rhinorrhea present.     Mouth/Throat:     Mouth: Mucous membranes are moist. Mucous membranes are pale.     Pharynx: Posterior oropharyngeal erythema present. No oropharyngeal exudate.     Tonsils: No tonsillar exudate or tonsillar abscesses.  Eyes:     Conjunctiva/sclera: Conjunctivae normal.     Pupils: Pupils are equal, round, and reactive to light.  Cardiovascular:     Rate and Rhythm: Normal rate and regular rhythm.     Heart sounds: Normal heart sounds. No murmur heard. No gallop.   Pulmonary:     Effort: Pulmonary effort is normal.     Breath sounds: Normal breath sounds.  Abdominal:     General: Bowel sounds are normal.     Palpations: Abdomen is soft.  Musculoskeletal:     Cervical back: Normal range of motion and neck supple.  Skin:    General: Skin is warm and dry.     Capillary Refill: Capillary refill takes less than 2 seconds.  Neurological:     General: No focal deficit present.     Mental Status:  She is alert and oriented to person, place, and time.       Lab Results  Component Value Date   WBC 6.0 01/23/2021   HGB 12.4 01/23/2021   HCT 40.6 01/23/2021   PLT 256 01/23/2021   GLUCOSE 98 01/23/2021   ALT 12 01/23/2021  AST 13 (L) 01/23/2021   NA 140 01/23/2021   K 3.8 01/23/2021   CL 106 01/23/2021   CREATININE 0.74 01/23/2021   BUN 14 01/23/2021   CO2 26 01/23/2021   TSH 10.400 (H) 12/23/2020   INR 0.95 05/03/2018      Assessment & Plan:  Diagnoses and all orders for this visit: Simple chronic bronchitis (HCC) Obstructive chronic bronchitis with exacerbation (HCC) -     albuterol (VENTOLIN HFA) 108 (90 Base) MCG/ACT inhaler; Inhale 2 puffs into the lungs every 6 (six) hours as needed for wheezing or shortness of breath. Z-pack  -     triamcinolone acetonide (KENALOG-40) injection 80 mg         I spent 20 minutes dedicated to the care of this patient on the date of this encounter to include face-to-face time with the patient, as well as:   Follow-up: No follow-ups on file.  An After Visit Summary was printed and given to the patient.  Reinaldo Meeker, MD [facility] [phone]

## 2021-03-17 ENCOUNTER — Other Ambulatory Visit: Payer: Self-pay

## 2021-03-17 ENCOUNTER — Encounter: Payer: Self-pay | Admitting: Legal Medicine

## 2021-03-17 ENCOUNTER — Ambulatory Visit (INDEPENDENT_AMBULATORY_CARE_PROVIDER_SITE_OTHER): Payer: Medicare HMO | Admitting: Legal Medicine

## 2021-03-17 VITALS — BP 110/70 | HR 81 | Temp 97.6°F | Resp 18 | Ht 65.0 in | Wt 185.8 lb

## 2021-03-17 DIAGNOSIS — J410 Simple chronic bronchitis: Secondary | ICD-10-CM | POA: Diagnosis not present

## 2021-03-17 DIAGNOSIS — J441 Chronic obstructive pulmonary disease with (acute) exacerbation: Secondary | ICD-10-CM

## 2021-03-17 MED ORDER — CLINDAMYCIN HCL 150 MG PO CAPS: 150.0000 mg | ORAL_CAPSULE | Freq: Three times a day (TID) | ORAL | 0 refills | Status: DC

## 2021-03-17 NOTE — Progress Notes (Signed)
Subjective:  Patient ID: Victoria Pena, female    DOB: 1951/06/06  Age: 70 y.o. MRN: [id number]  Chief Complaint  Patient presents with  . Bronchitis    Patient states she is still having a hard time breathing and still coughing, BP was 166/103 and pulse was 91 last night. She has been using inhaler every 6 hours.    BOF:BPZWCH UP  Patient has COPD with exacerbation. Finished Z-pack.She remains dyspneic and having trouble breathing with chest tightness.  No edema.  Duoneb helped breathing   Current Outpatient Medications on File Prior to Visit  Medication Sig Dispense Refill  . albuterol (VENTOLIN HFA) 108 (90 Base) MCG/ACT inhaler Inhale 2 puffs into the lungs every 6 (six) hours as needed for wheezing or shortness of breath. 8 g 2  . ALPRAZolam (XANAX) 0.5 MG tablet Take 1 tablet (0.5 mg total) by mouth 2 (two) times daily as needed for anxiety. 60 tablet 3  . buPROPion (WELLBUTRIN XL) 150 MG 24 hr tablet Take 1 tablet (150 mg total) by mouth daily. 90 tablet 2  . calcium carbonate (TUMS - DOSED IN MG ELEMENTAL CALCIUM) 500 MG chewable tablet Chew 1 tablet by mouth 2 (two) times daily as needed.     . Ferrous Sulfate Dried (FERROUS SULFATE IRON PO) Take 325 mg by mouth daily.    Marland Kitchen ibuprofen (ADVIL,MOTRIN) 200 MG tablet Take 600 mg by mouth 2 (two) times daily as needed for mild pain.    Marland Kitchen levothyroxine (SYNTHROID) 137 MCG tablet Take 1 tablet (137 mcg total) by mouth daily. 90 tablet 2  . losartan (COZAAR) 50 MG tablet TAKE 1 TABLET(50 MG) BY MOUTH ONCE DAILY 90 tablet 2  . omeprazole (PRILOSEC) 40 MG capsule TAKE 1 CAPSULE (40 MG) BY ORAL ROUTE ONE TIMES PER DAY BEFORE A MEAL 90 capsule 2  . pregabalin (LYRICA) 150 MG capsule Take 1 capsule (150 mg total) by mouth 2 (two) times daily. 60 capsule 5  . traMADol (ULTRAM) 50 MG tablet Take 1 tablet (50 mg total) by mouth 4 (four) times daily. 120 tablet 2   No current facility-administered medications on file prior to visit.   Past  Medical History:  Diagnosis Date  . Adenocarcinoma of right lung, stage 1 ([place]) 07/26/2016  . Anxiety   . Benign essential hypertension 12/27/2016  . COPD (chronic obstructive pulmonary disease) ([place])    pt denies  . Depression   . Hypertension   . Pericardial cyst 02/02/2021  . Thyroid disease    Past Surgical History:  Procedure Laterality Date  . ANKLE SURGERY     bilateral  . APPENDECTOMY    . CHOLECYSTECTOMY    . REPLACEMENT TOTAL KNEE Left   . THYROIDECTOMY      Family History  Problem Relation Age of Onset  . Cancer Mother   . Cancer Father   . Cancer Brother    Social History   Socioeconomic History  . Marital status: Widowed    Spouse name: Not on file  . Number of children: Not on file  . Years of education: Not on file  . Highest education level: Not on file  Occupational History  . Not on file  Tobacco Use  . Smoking status: Former Smoker    Packs/day: 1.50    Years: 35.00    Pack years: 52.50    Quit date: 07/27/1999    Years since quitting: 21.6  . Smokeless tobacco: Never Used  Vaping Use  .  Vaping Use: Never used  Substance and Sexual Activity  . Alcohol use: No  . Drug use: No  . Sexual activity: Not Currently  Other Topics Concern  . Not on file  Social History Narrative  . Not on file   Social Determinants of Health   Financial Resource Strain: Not on file  Food Insecurity: No Food Insecurity  . Worried About Charity fundraiser in the Last Year: Never true  . Ran Out of Food in the Last Year: Never true  Transportation Needs: No Transportation Needs  . Lack of Transportation (Medical): No  . Lack of Transportation (Non-Medical): No  Physical Activity: Not on file  Stress: Stress Concern Present  . Feeling of Stress : Very much  Social Connections: Not on file    Review of Systems  Constitutional: Negative for activity change and appetite change.  HENT: Negative.   Eyes: Negative for visual disturbance.  Respiratory: Positive for  cough, chest tightness and shortness of breath.   Cardiovascular: Negative for chest pain, palpitations and leg swelling.  Gastrointestinal: Negative for abdominal distention and abdominal pain.  Genitourinary: Negative for dysuria.  Musculoskeletal: Negative for arthralgias and back pain.  Skin: Negative.   Neurological: Negative.   Psychiatric/Behavioral: Negative.      Objective:  BP 110/70 (BP Location: Left Arm, Patient Position: Sitting, Cuff Size: Normal)   Pulse 81   Temp 97.6 F (36.4 C) (Temporal)   Resp 18   Ht 5\' 5"  (1.651 m)   Wt 185 lb 12.8 oz (84.3 kg)   LMP  (LMP Unknown)   SpO2 96%   BMI 30.92 kg/m   BP/Weight 03/17/2021 9/37/9024 0/08/7352  Systolic BP 299 242 683  Diastolic BP 70 68 71  Wt. (Lbs) 185.8 187.4 187.8  BMI 30.92 31.18 31.25    Physical Exam Vitals reviewed.  Constitutional:      Appearance: Normal appearance.  HENT:     Right Ear: Tympanic membrane normal.     Left Ear: Tympanic membrane normal.  Eyes:     Extraocular Movements: Extraocular movements intact.     Conjunctiva/sclera: Conjunctivae normal.     Pupils: Pupils are equal, round, and reactive to light.  Cardiovascular:     Rate and Rhythm: Normal rate and regular rhythm.     Pulses: Normal pulses.     Heart sounds: Normal heart sounds. No murmur heard. No gallop.   Pulmonary:     Effort: Pulmonary effort is normal.     Breath sounds: Wheezing and rhonchi present.  Musculoskeletal:        General: Normal range of motion.     Cervical back: Normal range of motion and neck supple.  Skin:    General: Skin is warm.  Neurological:     Mental Status: She is alert.       Lab Results  Component Value Date   WBC 6.0 01/23/2021   HGB 12.4 01/23/2021   HCT 40.6 01/23/2021   PLT 256 01/23/2021   GLUCOSE 98 01/23/2021   ALT 12 01/23/2021   AST 13 (L) 01/23/2021   NA 140 01/23/2021   K 3.8 01/23/2021   CL 106 01/23/2021   CREATININE 0.74 01/23/2021   BUN 14 01/23/2021    CO2 26 01/23/2021   TSH 10.400 (H) 12/23/2020   INR 0.95 05/03/2018      Assessment & Plan:  Diagnoses and all orders for this visit: Simple chronic bronchitis ([place]) Patient has chronic bronchitis  Obstructive chronic bronchitis  with exacerbation ([place]) -     clindamycin (CLEOCIN) 150 MG capsule; Take 1 capsule (150 mg total) by mouth 3 (three) times daily. Patient is having a exacerbation and Z-pack and kenalog shot with albuterol not holding.  She was given duoneb and her breathing improved.  Patient give #2 Breztri samples to use BID         I spent 20 minutes dedicated to the care of this patient on the date of this encounter to include face-to-face time with the patient, as well as:  Follow-up: Return in about 2 weeks (around 03/31/2021) for copd.  An After Visit Summary was printed and given to the patient.  Reinaldo Meeker, MD [facility] [phone]

## 2021-03-23 ENCOUNTER — Other Ambulatory Visit: Payer: Self-pay

## 2021-03-23 ENCOUNTER — Ambulatory Visit (INDEPENDENT_AMBULATORY_CARE_PROVIDER_SITE_OTHER): Payer: Medicare HMO | Admitting: Legal Medicine

## 2021-03-23 ENCOUNTER — Telehealth (HOSPITAL_COMMUNITY): Payer: Self-pay | Admitting: *Deleted

## 2021-03-23 ENCOUNTER — Encounter: Payer: Self-pay | Admitting: Legal Medicine

## 2021-03-23 VITALS — BP 118/64 | HR 84 | Temp 96.6°F | Ht 65.0 in | Wt 180.6 lb

## 2021-03-23 DIAGNOSIS — J441 Chronic obstructive pulmonary disease with (acute) exacerbation: Secondary | ICD-10-CM | POA: Diagnosis not present

## 2021-03-23 MED ORDER — BUDESON-GLYCOPYRROL-FORMOTEROL 160-9-4.8 MCG/ACT IN AERO: 2.0000 | INHALATION_SPRAY | Freq: Two times a day (BID) | RESPIRATORY_TRACT | 6 refills | Status: DC

## 2021-03-23 NOTE — Telephone Encounter (Signed)
Reaching out to patient to offer assistance regarding upcoming cardiac imaging study; pt verbalizes understanding of appt date/time, parking situation and where to check in; name and call back number provided for further questions should they arise  Gordy Clement RN Navigator [facility] [place] [facility] [phone] office [phone] cell

## 2021-03-23 NOTE — Progress Notes (Signed)
Subjective:  Patient ID: Victoria Pena, female    DOB: 12-Jun-1951  Age: 70 y.o. MRN: [id number]  Chief Complaint  Patient presents with  . Cough    HPI Patient states that she has been treated for a cough, however does not seem to be improving. Has been treated with antibiotics, inhalers and breathing treatments. Reports her cough is dry (has blood shot right eye from coughing) and gets s.o.b with exertion and with talking. Has a MRI of her chest scheduled for tomorrow (for cyst). Has taken off the whole month of April. Judithann Sauger is helping some. She still has some dyspnea but is much improved.  She is to get a CT. Current Outpatient Medications on File Prior to Visit  Medication Sig Dispense Refill  . albuterol (VENTOLIN HFA) 108 (90 Base) MCG/ACT inhaler Inhale 2 puffs into the lungs every 6 (six) hours as needed for wheezing or shortness of breath. 8 g 2  . ALPRAZolam (XANAX) 0.5 MG tablet Take 1 tablet (0.5 mg total) by mouth 2 (two) times daily as needed for anxiety. 60 tablet 3  . buPROPion (WELLBUTRIN XL) 150 MG 24 hr tablet Take 1 tablet (150 mg total) by mouth daily. 90 tablet 2  . calcium carbonate (TUMS - DOSED IN MG ELEMENTAL CALCIUM) 500 MG chewable tablet Chew 1 tablet by mouth 2 (two) times daily as needed.     . Ferrous Sulfate Dried (FERROUS SULFATE IRON PO) Take 325 mg by mouth daily.    Marland Kitchen ibuprofen (ADVIL,MOTRIN) 200 MG tablet Take 600 mg by mouth 2 (two) times daily as needed for mild pain.    Marland Kitchen levothyroxine (SYNTHROID) 137 MCG tablet Take 1 tablet (137 mcg total) by mouth daily. 90 tablet 2  . losartan (COZAAR) 50 MG tablet TAKE 1 TABLET(50 MG) BY MOUTH ONCE DAILY 90 tablet 2  . omeprazole (PRILOSEC) 40 MG capsule TAKE 1 CAPSULE (40 MG) BY ORAL ROUTE ONE TIMES PER DAY BEFORE A MEAL 90 capsule 2  . pregabalin (LYRICA) 150 MG capsule Take 1 capsule (150 mg total) by mouth 2 (two) times daily. 60 capsule 5  . traMADol (ULTRAM) 50 MG tablet Take 1 tablet (50 mg total) by  mouth 4 (four) times daily. 120 tablet 2   No current facility-administered medications on file prior to visit.   Past Medical History:  Diagnosis Date  . Adenocarcinoma of right lung, stage 1 ([place]) 07/26/2016  . Anxiety   . Benign essential hypertension 12/27/2016  . COPD (chronic obstructive pulmonary disease) ([place])    pt denies  . Depression   . Hypertension   . Pericardial cyst 02/02/2021  . Thyroid disease    Past Surgical History:  Procedure Laterality Date  . ANKLE SURGERY     bilateral  . APPENDECTOMY    . CHOLECYSTECTOMY    . REPLACEMENT TOTAL KNEE Left   . THYROIDECTOMY      Family History  Problem Relation Age of Onset  . Cancer Mother   . Cancer Father   . Cancer Brother    Social History   Socioeconomic History  . Marital status: Widowed    Spouse name: Not on file  . Number of children: Not on file  . Years of education: Not on file  . Highest education level: Not on file  Occupational History  . Not on file  Tobacco Use  . Smoking status: Former Smoker    Packs/day: 1.50    Years: 35.00    Pack years:  52.50    Quit date: 07/27/1999    Years since quitting: 21.6  . Smokeless tobacco: Never Used  Vaping Use  . Vaping Use: Never used  Substance and Sexual Activity  . Alcohol use: No  . Drug use: No  . Sexual activity: Not Currently  Other Topics Concern  . Not on file  Social History Narrative  . Not on file   Social Determinants of Health   Financial Resource Strain: Not on file  Food Insecurity: No Food Insecurity  . Worried About Charity fundraiser in the Last Year: Never true  . Ran Out of Food in the Last Year: Never true  Transportation Needs: Not on file  Physical Activity: Not on file  Stress: Not on file  Social Connections: Not on file    Review of Systems  Constitutional: Positive for fatigue. Negative for chills and fever.  HENT: Negative for congestion, ear pain, postnasal drip, rhinorrhea, sinus pressure, sinus pain and  sore throat.   Respiratory: Positive for cough and shortness of breath.   Cardiovascular: Negative for chest pain.  Gastrointestinal: Negative for diarrhea and nausea.  Neurological: Negative for dizziness and headaches.     Objective:  BP 118/64   Pulse 84   Temp (!) 96.6 F (35.9 C)   Ht 5\' 5"  (1.651 m)   Wt 180 lb 9.6 oz (81.9 kg)   LMP  (LMP Unknown)   SpO2 99%   BMI 30.05 kg/m   BP/Weight 03/23/2021 03/17/2021 3/55/7322  Systolic BP 025 427 062  Diastolic BP 64 70 68  Wt. (Lbs) 180.6 185.8 187.4  BMI 30.05 30.92 31.18    Physical Exam Vitals reviewed.  Constitutional:      Appearance: Normal appearance.  HENT:     Head: Normocephalic and atraumatic.     Right Ear: Tympanic membrane, ear canal and external ear normal.     Left Ear: Tympanic membrane, ear canal and external ear normal.  Eyes:     Conjunctiva/sclera: Conjunctivae normal.     Pupils: Pupils are equal, round, and reactive to light.  Cardiovascular:     Rate and Rhythm: Normal rate and regular rhythm.     Pulses: Normal pulses.     Heart sounds: Normal heart sounds. No murmur heard. No gallop.   Pulmonary:     Breath sounds: Rhonchi present.  Abdominal:     General: Abdomen is flat. Bowel sounds are normal.  Musculoskeletal:        General: Normal range of motion.     Cervical back: Normal range of motion and neck supple.  Skin:    General: Skin is warm.     Capillary Refill: Capillary refill takes less than 2 seconds.  Neurological:     General: No focal deficit present.     Mental Status: She is alert and oriented to person, place, and time.       Lab Results  Component Value Date   WBC 6.0 01/23/2021   HGB 12.4 01/23/2021   HCT 40.6 01/23/2021   PLT 256 01/23/2021   GLUCOSE 98 01/23/2021   ALT 12 01/23/2021   AST 13 (L) 01/23/2021   NA 140 01/23/2021   K 3.8 01/23/2021   CL 106 01/23/2021   CREATININE 0.74 01/23/2021   BUN 14 01/23/2021   CO2 26 01/23/2021   TSH 10.400 (H)  12/23/2020   INR 0.95 05/03/2018      Assessment & Plan:   Diagnoses and all orders for  this visit: Obstructive chronic bronchitis with exacerbation ([place]) Continue on Breztri and start home nebulizer treatments for albuterol bid PRN sent forms in       Follow-up: No follow-ups on file.  An After Visit Summary was printed and given to the patient.  Reinaldo Meeker, MD [facility] [phone]

## 2021-03-24 ENCOUNTER — Ambulatory Visit (HOSPITAL_COMMUNITY)
Admission: RE | Admit: 2021-03-24 | Discharge: 2021-03-24 | Disposition: A | Discharge status: Home / Self Care | Payer: Medicare HMO | Source: Ambulatory Visit | Attending: Cardiology | Admitting: Cardiology

## 2021-03-24 DIAGNOSIS — Q248 Other specified congenital malformations of heart: Secondary | ICD-10-CM | POA: Insufficient documentation

## 2021-03-24 MED ORDER — GADOBUTROL 1 MMOL/ML IV SOLN
8.0000 mL | Freq: Once | INTRAVENOUS | Status: AC | PRN
  Administered 2021-03-24: 8 mL via INTRAVENOUS

## 2021-03-25 NOTE — Progress Notes (Deleted)
[address]       Victor[place] [address]             [phone]        Victoria Pena [place] Medical Record #[id number] Date of Birth: 03/11/1951  Referring: Lillard Anes,* Primary Care: Lillard Anes, MD Primary Cardiologist:No primary care provider on file.  Chief Complaint:   No chief complaint on file.   History of Present Illness:     70 year old female referred for surgical evaluation of a pericardial cyst.  She was originally seen by Dr. Sherren Mocha.  She has a history of right upper lobectomy for stage I lung cancer.  Pericardial cyst was found incidentally in 2021.  Past Medical and Surgical History: Previous Chest Surgery: *** Previous Chest Radiation: *** Diabetes Mellitus: ***.  HbA1C *** Creatinine: ***  Past Medical History:  Diagnosis Date  . Adenocarcinoma of right lung, stage 1 ([place]) 07/26/2016  . Anxiety   . Benign essential hypertension 12/27/2016  . COPD (chronic obstructive pulmonary disease) ([place])    pt denies  . Depression   . Hypertension   . Pericardial cyst 02/02/2021  . Thyroid disease     Past Surgical History:  Procedure Laterality Date  . ANKLE SURGERY     bilateral  . APPENDECTOMY    . CHOLECYSTECTOMY    . REPLACEMENT TOTAL KNEE Left   . THYROIDECTOMY      Social History: Support: ***  Social History   Tobacco Use  Smoking Status Former Smoker  . Packs/day: 1.50  . Years: 35.00  . Pack years: 52.50  . Quit date: 07/27/1999  . Years since quitting: 21.6  Smokeless Tobacco Never Used    Social History   Substance and Sexual Activity  Alcohol Use No     Allergies  Allergen Reactions  . Dextromethorphan Hbr Shortness Of Breath  . Keflex [Cephalexin] Shortness Of Breath    Hard time breathing  . Robaxin [Methocarbamol] Nausea Only    Stomach pain . Could not eat  . Bactrim [Sulfamethoxazole-Trimethoprim] Itching  . Hydroxyzine Other (See Comments) and Swelling    Numbness in lips   . Prednisone & Diphenhydramine Other (See Comments)    Face flushed, heart racing  . Ciprofloxacin   . Dextromethorphan Polistirex Er   . Meloxicam Other (See Comments)    Headache, body aches  . Nitrofurantoin   . Diphenhydramine Hcl Palpitations  . Moxifloxacin Rash  . Prednisone Other (See Comments) and Palpitations    "flushing"     Medications: Asprin: *** Statin: *** Beta Blocker: *** Ace Inhibitor: *** Anti-Coagulation: ***  Current Outpatient Medications  Medication Sig Dispense Refill  . albuterol (VENTOLIN HFA) 108 (90 Base) MCG/ACT inhaler Inhale 2 puffs into the lungs every 6 (six) hours as needed for wheezing or shortness of breath. 8 g 2  . ALPRAZolam (XANAX) 0.5 MG tablet Take 1 tablet (0.5 mg total) by mouth 2 (two) times daily as needed for anxiety. 60 tablet 3  . Budeson-Glycopyrrol-Formoterol 160-9-4.8 MCG/ACT AERO Inhale 2 puffs into the lungs in the morning and at bedtime. 10.7 g 6  . buPROPion (WELLBUTRIN XL) 150 MG 24 hr tablet Take 1 tablet (150 mg total) by mouth daily. 90 tablet 2  . calcium carbonate (TUMS - DOSED IN MG ELEMENTAL CALCIUM) 500 MG chewable tablet Chew 1 tablet by mouth 2 (two) times daily as needed.     . Ferrous Sulfate Dried (FERROUS SULFATE IRON PO)  Take 325 mg by mouth daily.    Marland Kitchen ibuprofen (ADVIL,MOTRIN) 200 MG tablet Take 600 mg by mouth 2 (two) times daily as needed for mild pain.    Marland Kitchen levothyroxine (SYNTHROID) 137 MCG tablet Take 1 tablet (137 mcg total) by mouth daily. 90 tablet 2  . losartan (COZAAR) 50 MG tablet TAKE 1 TABLET(50 MG) BY MOUTH ONCE DAILY 90 tablet 2  . omeprazole (PRILOSEC) 40 MG capsule TAKE 1 CAPSULE (40 MG) BY ORAL ROUTE ONE TIMES PER DAY BEFORE A MEAL 90 capsule 2  . pregabalin (LYRICA) 150 MG capsule Take 1 capsule (150 mg total) by mouth 2 (two) times daily. 60 capsule 5  . traMADol (ULTRAM) 50 MG tablet Take 1 tablet (50 mg total) by mouth 4 (four) times daily. 120 tablet 2   No current  facility-administered medications for this visit.    (Not in a hospital admission)   Family History  Problem Relation Age of Onset  . Cancer Mother   . Cancer Father   . Cancer Brother      Review of Systems:   ROS    Physical Exam: LMP  (LMP Unknown)  Physical Exam    Diagnostic Studies & Laboratory data:    Left Heart Catherization: *** Echo: ***   I have independently reviewed the above radiologic studies and discussed with the patient   Recent Lab Findings: Lab Results  Component Value Date   WBC 6.0 01/23/2021   HGB 12.4 01/23/2021   HCT 40.6 01/23/2021   PLT 256 01/23/2021   GLUCOSE 98 01/23/2021   ALT 12 01/23/2021   AST 13 (L) 01/23/2021   NA 140 01/23/2021   K 3.8 01/23/2021   CL 106 01/23/2021   CREATININE 0.74 01/23/2021   BUN 14 01/23/2021   CO2 26 01/23/2021   TSH 10.400 (H) 12/23/2020   INR 0.95 05/03/2018      Assessment / Plan:   70 year old female with history of right upper lobectomy for stage I adenocarcinoma the lung being evaluated for pericardial cyst.  On review of her images dating back to 2018 there has been a pericardial cyst on the right side by the left atrium.  There has been some interval growth and the pericardial cyst since then.      I  spent {CHL ONC TIME VISIT - TWSFK:[id number]} counseling the patient face to face.   Lajuana Matte 03/25/2021 11:40 AM

## 2021-03-26 ENCOUNTER — Telehealth: Payer: Self-pay

## 2021-03-26 NOTE — Telephone Encounter (Signed)
-----   Message from Berniece Salines, DO sent at 03/25/2021  9:09 AM EDT ----- MRI confirmed the pericardial cyst which is posterior to the left atrium.  I see your appointment for CT surgery is April 8.

## 2021-03-26 NOTE — Telephone Encounter (Signed)
Spoke with patient regarding results and recommendation.  Patient verbalizes understanding and is agreeable to plan of care. Advised patient to call back with any issues or concerns.  

## 2021-03-27 ENCOUNTER — Other Ambulatory Visit: Payer: Self-pay

## 2021-03-27 ENCOUNTER — Encounter: Payer: Self-pay | Admitting: Thoracic Surgery (Cardiothoracic Vascular Surgery)

## 2021-03-27 ENCOUNTER — Encounter: Payer: Medicare HMO | Admitting: Thoracic Surgery (Cardiothoracic Vascular Surgery)

## 2021-03-27 ENCOUNTER — Institutional Professional Consult (permissible substitution): Payer: Medicare HMO | Admitting: Thoracic Surgery (Cardiothoracic Vascular Surgery)

## 2021-03-27 VITALS — BP 119/79 | HR 80 | Resp 20 | Ht 65.0 in | Wt 180.0 lb

## 2021-03-27 DIAGNOSIS — J42 Unspecified chronic bronchitis: Secondary | ICD-10-CM | POA: Diagnosis not present

## 2021-03-27 DIAGNOSIS — Q248 Other specified congenital malformations of heart: Secondary | ICD-10-CM

## 2021-03-27 NOTE — Progress Notes (Signed)
[address]       [place] [address]             [phone]                    Victoria Pena Record #[id number] Date of Birth: 12/19/1951  Referring: Berniece Salines, DO Primary Care: Lillard Anes, MD Primary Cardiologist: No primary care provider on file.  Chief Complaint:    Chief Complaint  Patient presents with  . pericardial cyst    Surgical consult, Chest CT 01/23/21, Cardiac MRI 03/24/21    History of Present Illness:    Victoria Pena 70 y.o. female referred for surgical evaluation of a pericardial cyst.  She states that this has been present for a number of years but appears to be growing.  She does admit to some respiratory symptoms that are worse over the last month but this could potentially be attributed to a recent bout with bronchitis.  She also complains of some right scapular and shoulder pain.  She denies any dysphagia.  Her weight has been stable as well.    Past Medical History:  Diagnosis Date  . Adenocarcinoma of right lung, stage 1 ([place]) 07/26/2016  . Anxiety   . Benign essential hypertension 12/27/2016  . COPD (chronic obstructive pulmonary disease) ([place])    pt denies  . Depression   . Hypertension   . Pericardial cyst 02/02/2021  . Thyroid disease     Past Surgical History:  Procedure Laterality Date  . ANKLE SURGERY     bilateral  . APPENDECTOMY    . CHOLECYSTECTOMY    . REPLACEMENT TOTAL KNEE Left   . THYROIDECTOMY      Family History  Problem Relation Age of Onset  . Cancer Mother   . Cancer Father   . Cancer Brother      Social History   Tobacco Use  Smoking Status Former Smoker  . Packs/day: 1.50  . Years: 35.00  . Pack years: 52.50  . Quit date: 07/27/1999  . Years since quitting: 21.6  Smokeless Tobacco Never Used    Social History   Substance and Sexual Activity  Alcohol Use No     Allergies  Allergen Reactions  . Dextromethorphan Hbr Shortness Of Breath  .  Keflex [Cephalexin] Shortness Of Breath    Hard time breathing  . Robaxin [Methocarbamol] Nausea Only    Stomach pain . Could not eat  . Bactrim [Sulfamethoxazole-Trimethoprim] Itching  . Hydroxyzine Other (See Comments) and Swelling    Numbness in lips  . Prednisone & Diphenhydramine Other (See Comments)    Face flushed, heart racing  . Ciprofloxacin   . Dextromethorphan Polistirex Er   . Meloxicam Other (See Comments)    Headache, body aches  . Nitrofurantoin   . Diphenhydramine Hcl Palpitations  . Moxifloxacin Rash  . Prednisone Other (See Comments) and Palpitations    "flushing"     Current Outpatient Medications  Medication Sig Dispense Refill  . albuterol (VENTOLIN HFA) 108 (90 Base) MCG/ACT inhaler Inhale 2 puffs into the lungs every 6 (six) hours as needed for wheezing or shortness of breath. 8 g 2  . ALPRAZolam (XANAX) 0.5 MG tablet Take 1 tablet (0.5 mg total) by mouth 2 (two) times daily as needed for anxiety. 60 tablet 3  . Budeson-Glycopyrrol-Formoterol 160-9-4.8 MCG/ACT AERO Inhale 2 puffs into the lungs in the morning and at bedtime. 10.7 g  6  . buPROPion (WELLBUTRIN XL) 150 MG 24 hr tablet Take 1 tablet (150 mg total) by mouth daily. 90 tablet 2  . calcium carbonate (TUMS - DOSED IN MG ELEMENTAL CALCIUM) 500 MG chewable tablet Chew 1 tablet by mouth 2 (two) times daily as needed.     . Ferrous Sulfate Dried (FERROUS SULFATE IRON PO) Take 325 mg by mouth daily.    Marland Kitchen ibuprofen (ADVIL,MOTRIN) 200 MG tablet Take 600 mg by mouth 2 (two) times daily as needed for mild pain.    Marland Kitchen levothyroxine (SYNTHROID) 137 MCG tablet Take 1 tablet (137 mcg total) by mouth daily. 90 tablet 2  . losartan (COZAAR) 50 MG tablet TAKE 1 TABLET(50 MG) BY MOUTH ONCE DAILY 90 tablet 2  . omeprazole (PRILOSEC) 40 MG capsule TAKE 1 CAPSULE (40 MG) BY ORAL ROUTE ONE TIMES PER DAY BEFORE A MEAL 90 capsule 2  . pregabalin (LYRICA) 150 MG capsule Take 1 capsule (150 mg total) by mouth 2 (two) times  daily. 60 capsule 5  . traMADol (ULTRAM) 50 MG tablet Take 1 tablet (50 mg total) by mouth 4 (four) times daily. 120 tablet 2   No current facility-administered medications for this visit.    Review of Systems  Constitutional: Positive for malaise/fatigue.  Respiratory: Positive for cough and shortness of breath.   Cardiovascular: Negative for chest pain.  Gastrointestinal: Negative.   Musculoskeletal: Positive for joint pain and myalgias.  Neurological: Negative.     PHYSICAL EXAMINATION: BP 119/79   Pulse 80   Resp 20   Ht 5\' 5"  (1.651 m)   Wt 180 lb (81.6 kg)   LMP  (LMP Unknown)   SpO2 95% Comment: RA  BMI 29.95 kg/m   Physical Exam Constitutional:      General: She is not in acute distress.    Appearance: Normal appearance. She is obese. She is not ill-appearing or toxic-appearing.  HENT:     Head: Normocephalic and atraumatic.  Cardiovascular:     Rate and Rhythm: Normal rate and regular rhythm.  Pulmonary:     Effort: Pulmonary effort is normal. No respiratory distress.  Abdominal:     General: Bowel sounds are normal.  Skin:    General: Skin is warm and dry.  Neurological:     General: No focal deficit present.     Mental Status: She is alert and oriented to person, place, and time.      Diagnostic Studies & Laboratory data:     Recent Radiology Findings:   MR Card Morphology Wo/W Cm  Result Date: 03/24/2021 CLINICAL DATA:  Possible pericardial cyst seen on CT EXAM: CARDIAC MRI TECHNIQUE: The patient was scanned on a 1.5 Tesla Siemens magnet. A dedicated cardiac coil was used. Functional imaging was done using Fiesta sequences. 2,3, and 4 chamber views were done to assess for RWMA's. Modified Simpson's rule using a short axis stack was used to calculate an ejection fraction on a dedicated work Conservation officer, nature. The patient received 8 cc of Gadavist. After 10 minutes inversion recovery sequences were used to assess for infiltration and scar  tissue. CONTRAST:  8 cc  of Gadavist FINDINGS: Left ventricle: -Normal size -Normal systolic function -Normal ECV (27%) -No LGE LV EF:  60% (Normal 56-78%) Absolute volumes: LV EDV: 30mL (Normal 52-141 mL) LV ESV: 35mL (Normal 13-51 mL) LV SV: 28mL (Normal 33-97 mL) CO: 4.1L/min (Normal 2.7-6.0 L/min) Indexed volumes: LV EDV: 45mL/sq-m (Normal 41-81 mL/sq-m) LV ESV: 49mL/sq-m (Normal 12-21  mL/sq-m) LV SV: 68mL/sq-m (Normal 26-56 mL/sq-m) CI: 2.1L0/min/sq-m (Normal 1.8-3.8 L/min/sq-m) Right ventricle: Normal size and systolic function RV EF: 14% (Normal 47-80%) Absolute volumes: RV EDV: 159mL (Normal 58-154 mL) RV ESV: 16mL (Normal 12-68 mL) RV SV: 95mL (Normal 35-98 mL) CO: 4.4L/min (Normal 2.7-6 L/min) Indexed volumes: RV EDV: 43mL/sq-m (Normal 48-87 mL/sq-m) RV ESV: 68mL/sq-m (Normal 11-28 mL/sq-m) RV SV: 17mL/sq-m (Normal 27-57 mL/sq-m) CI: 2.3L/min/sq-m (Normal 1.8-3.8 L/min/sq-m) Left atrium: Mild enlargement. Mass posterior to left atrium measuring 79mm x 97mm x 32mm. Mass is homogenous, hyperintense on T2 weighted imaging, hypointense on T1 weighted imaging, no late gadolinium enhancement. This is consistent with a pericardial cyst Right atrium: Normal size Mitral valve: No regurgitation Aortic valve: No regurgitation Tricuspid valve: Trivial regurgitation Aorta: Normal proximal ascending aorta Pericardium: Trivial effusion IMPRESSION: 1. Mass posterior to left atrium measures 62mm x 55mm x 60mm. Mass is homogenous, hyperintense on T2 weighted imaging, hypointense on T1 weighted imaging, no late gadolinium enhancement. This is consistent with a pericardial cyst 2. Normal LV size and systolic function (EF 78%). No LGE to suggest myocardial scar 3.  Normal RV size and systolic function (EF 29%) Electronically Signed   By: Oswaldo Milian MD   On: 03/24/2021 23:27       I have independently reviewed the above radiology studies  and reviewed the findings with the patient.   Recent Lab Findings: Lab  Results  Component Value Date   WBC 6.0 01/23/2021   HGB 12.4 01/23/2021   HCT 40.6 01/23/2021   PLT 256 01/23/2021   GLUCOSE 98 01/23/2021   ALT 12 01/23/2021   AST 13 (L) 01/23/2021   NA 140 01/23/2021   K 3.8 01/23/2021   CL 106 01/23/2021   CREATININE 0.74 01/23/2021   BUN 14 01/23/2021   CO2 26 01/23/2021   TSH 10.400 (H) 12/23/2020   INR 0.95 05/03/2018         Assessment / Plan:   70 year old female with a posterior pericardial cyst.  She has a history of lung cancer that was resected robotically on the right in [place].  I reviewed her imaging dating back to 2018.  It appears that the pericardial cyst was present on that CT scan, but it has shown interval growth.  The MRI does confirm that it is a cyst and there is not much of a solid component.  Most of her complaints are musculoskeletal however she also does complain of some progressive dyspnea in the last month after a bout of bronchitis.  Her CT scan does not show significant emphysema and she is only on a rescue inhaler.  Given her history of right upper lobectomy I do think that it is warranted for her to be evaluated by pulmonologist to determine whether her respiratory symptoms are due to her lungs or potentially from this pericardial cyst pushing on the left atrium.  Once this is completed we will assess whether or not surgical resection for this pericardial cyst is warranted.      Lajuana Matte 03/27/2021 5:31 PM

## 2021-03-30 DIAGNOSIS — J42 Unspecified chronic bronchitis: Secondary | ICD-10-CM | POA: Diagnosis not present

## 2021-03-31 ENCOUNTER — Ambulatory Visit: Payer: Medicare HMO | Admitting: Legal Medicine

## 2021-04-06 ENCOUNTER — Other Ambulatory Visit: Payer: Self-pay | Admitting: Legal Medicine

## 2021-04-08 ENCOUNTER — Ambulatory Visit: Payer: Medicare HMO | Admitting: Legal Medicine

## 2021-04-09 ENCOUNTER — Ambulatory Visit (INDEPENDENT_AMBULATORY_CARE_PROVIDER_SITE_OTHER): Payer: Medicare HMO | Admitting: Legal Medicine

## 2021-04-09 ENCOUNTER — Encounter: Payer: Self-pay | Admitting: Legal Medicine

## 2021-04-09 ENCOUNTER — Other Ambulatory Visit: Payer: Self-pay

## 2021-04-09 VITALS — BP 102/70 | HR 76 | Temp 96.1°F | Resp 16 | Ht 65.0 in | Wt 181.0 lb

## 2021-04-09 DIAGNOSIS — I10 Essential (primary) hypertension: Secondary | ICD-10-CM | POA: Diagnosis not present

## 2021-04-09 DIAGNOSIS — M1990 Unspecified osteoarthritis, unspecified site: Secondary | ICD-10-CM | POA: Diagnosis not present

## 2021-04-09 DIAGNOSIS — C3491 Malignant neoplasm of unspecified part of right bronchus or lung: Secondary | ICD-10-CM

## 2021-04-09 DIAGNOSIS — E079 Disorder of thyroid, unspecified: Secondary | ICD-10-CM

## 2021-04-09 MED ORDER — MELOXICAM 15 MG PO TABS: 15.0000 mg | ORAL_TABLET | Freq: Every day | ORAL | 0 refills | Status: DC

## 2021-04-09 NOTE — Progress Notes (Signed)
Subjective:  Patient ID: Victoria Pena, female    DOB: 05-07-1951  Age: 70 y.o. MRN: [id number]  Chief Complaint  Patient presents with  . COPD    HPI: patient is her for follow up of COPD, she is on breztri and albuterol nebulizer. She is breathing well and walking.  inflammatory arthritis in hands especially MP joints hands and feet.  Gelling phenomena in AM  Patient has HYPOTHYROIDISM.  Diagnosed 10 years ago.  Patient has unstable thyroid readings.  Patient is having loss of energy.  Last TSH was 10.  change dosage of thyroid medicine.   Current Outpatient Medications on File Prior to Visit  Medication Sig Dispense Refill  . albuterol (VENTOLIN HFA) 108 (90 Base) MCG/ACT inhaler Inhale 2 puffs into the lungs every 6 (six) hours as needed for wheezing or shortness of breath. 8 g 2  . ALPRAZolam (XANAX) 0.5 MG tablet Take 1 tablet (0.5 mg total) by mouth 2 (two) times daily as needed for anxiety. 60 tablet 3  . Budeson-Glycopyrrol-Formoterol 160-9-4.8 MCG/ACT AERO Inhale 2 puffs into the lungs in the morning and at bedtime. 10.7 g 6  . buPROPion (WELLBUTRIN XL) 150 MG 24 hr tablet Take 1 tablet (150 mg total) by mouth daily. 90 tablet 2  . calcium carbonate (TUMS - DOSED IN MG ELEMENTAL CALCIUM) 500 MG chewable tablet Chew 1 tablet by mouth 2 (two) times daily as needed.     . Ferrous Sulfate Dried (FERROUS SULFATE IRON PO) Take 325 mg by mouth daily.    Marland Kitchen ibuprofen (ADVIL,MOTRIN) 200 MG tablet Take 600 mg by mouth 2 (two) times daily as needed for mild pain.    Marland Kitchen levothyroxine (SYNTHROID) 137 MCG tablet Take 1 tablet (137 mcg total) by mouth daily. 90 tablet 2  . losartan (COZAAR) 50 MG tablet TAKE 1 TABLET(50 MG) BY MOUTH ONCE DAILY 90 tablet 2  . omeprazole (PRILOSEC) 40 MG capsule TAKE 1 CAPSULE ONE TIME DAILY BEFORE A MEAL 90 capsule 2  . pregabalin (LYRICA) 150 MG capsule Take 1 capsule (150 mg total) by mouth 2 (two) times daily. 60 capsule 5  . traMADol (ULTRAM) 50 MG tablet  Take 1 tablet (50 mg total) by mouth 4 (four) times daily. 120 tablet 2   No current facility-administered medications on file prior to visit.   Past Medical History:  Diagnosis Date  . Adenocarcinoma of right lung, stage 1 ([place]) 07/26/2016  . Anxiety   . Benign essential hypertension 12/27/2016  . COPD (chronic obstructive pulmonary disease) ([place])    pt denies  . Depression   . Hypertension   . Pericardial cyst 02/02/2021  . Thyroid disease    Past Surgical History:  Procedure Laterality Date  . ANKLE SURGERY     bilateral  . APPENDECTOMY    . CHOLECYSTECTOMY    . REPLACEMENT TOTAL KNEE Left   . THYROIDECTOMY      Family History  Problem Relation Age of Onset  . Cancer Mother   . Cancer Father   . Cancer Brother    Social History   Socioeconomic History  . Marital status: Widowed    Spouse name: Not on file  . Number of children: Not on file  . Years of education: Not on file  . Highest education level: Not on file  Occupational History  . Not on file  Tobacco Use  . Smoking status: Former Smoker    Packs/day: 1.50    Years: 35.00  Pack years: 52.50    Quit date: 07/27/1999    Years since quitting: 21.7  . Smokeless tobacco: Never Used  Vaping Use  . Vaping Use: Never used  Substance and Sexual Activity  . Alcohol use: No  . Drug use: No  . Sexual activity: Not Currently  Other Topics Concern  . Not on file  Social History Narrative  . Not on file   Social Determinants of Health   Financial Resource Strain: Not on file  Food Insecurity: No Food Insecurity  . Worried About Charity fundraiser in the Last Year: Never true  . Ran Out of Food in the Last Year: Never true  Transportation Needs: Not on file  Physical Activity: Not on file  Stress: Not on file  Social Connections: Not on file    Review of Systems  Constitutional: Negative for activity change and appetite change.  HENT: Negative.   Eyes: Negative for visual disturbance.  Respiratory:  Negative for choking and shortness of breath.   Cardiovascular: Negative for chest pain, palpitations and leg swelling.  Gastrointestinal: Negative for abdominal distention and abdominal pain.  Endocrine: Negative for polyuria.  Genitourinary: Negative for difficulty urinating, dysuria and urgency.  Musculoskeletal: Negative for arthralgias (MPJ) and back pain.  Skin: Negative.   Neurological: Negative.   Psychiatric/Behavioral: Negative.      Objective:  BP 102/70   Pulse 76   Temp (!) 96.1 F (35.6 C)   Resp 16   Ht 5\' 5"  (1.651 m)   Wt 181 lb (82.1 kg)   LMP  (LMP Unknown)   SpO2 95%   BMI 30.12 kg/m   BP/Weight 04/09/2021 12/25/1094 0/03/5408  Systolic BP 811 914 782  Diastolic BP 70 79 64  Wt. (Lbs) 181 180 180.6  BMI 30.12 29.95 30.05    Physical Exam Vitals reviewed.  Constitutional:      Appearance: Normal appearance.  HENT:     Head: Normocephalic.     Right Ear: Tympanic membrane normal.     Left Ear: Tympanic membrane normal.     Nose: Nose normal.     Mouth/Throat:     Mouth: Mucous membranes are dry.     Pharynx: Oropharynx is clear.  Eyes:     Extraocular Movements: Extraocular movements intact.     Conjunctiva/sclera: Conjunctivae normal.     Pupils: Pupils are equal, round, and reactive to light.  Cardiovascular:     Rate and Rhythm: Normal rate and regular rhythm.     Pulses: Normal pulses.     Heart sounds: Normal heart sounds. No murmur heard. No gallop.   Pulmonary:     Effort: Pulmonary effort is normal. No respiratory distress.     Breath sounds: Normal breath sounds. No rales.  Abdominal:     General: Abdomen is flat. Bowel sounds are normal. There is no distension.     Palpations: Abdomen is soft.     Tenderness: There is no abdominal tenderness.  Musculoskeletal:     Cervical back: Normal range of motion and neck supple.     Comments: Synovitis MPJ hands  Skin:    General: Skin is warm.  Neurological:     General: No focal deficit  present.     Mental Status: She is alert and oriented to person, place, and time.       Lab Results  Component Value Date   WBC 6.0 01/23/2021   HGB 12.4 01/23/2021   HCT 40.6 01/23/2021  PLT 256 01/23/2021   GLUCOSE 98 01/23/2021   ALT 12 01/23/2021   AST 13 (L) 01/23/2021   NA 140 01/23/2021   K 3.8 01/23/2021   CL 106 01/23/2021   CREATININE 0.74 01/23/2021   BUN 14 01/23/2021   CO2 26 01/23/2021   TSH 10.400 (H) 12/23/2020   INR 0.95 05/03/2018      Assessment & Plan:   Diagnoses and all orders for this visit: Adenocarcinoma of right lung, stage 1 ([place]) -     Ambulatory referral to Pulmonology She has no evidence for recurrence but we are referring to pulmonary for evaluation  Inflammatory arthritis -     ANA,IFA RA Diag Pnl w/rflx Tit/Patn -     meloxicam (MOBIC) 15 MG tablet; Take 1 tablet (15 mg total) by mouth daily. Patient is having inflammatory arthritis in hands, need rheumatology workup, start with lab  Primary hypertension -     CBC with Differential/Platelet -     Comprehensive metabolic panel An individual hypertension care plan was established and reinforced today.  The patient's status was assessed using clinical findings on exam and labs or diagnostic tests. The patient's success at meeting treatment goals on disease specific evidence-based guidelines and found to be well controlled. SELF MANAGEMENT: The patient and I together assessed ways to personally work towards obtaining the recommended goals. RECOMMENDATIONS: avoid decongestants found in common cold remedies, decrease consumption of alcohol, perform routine monitoring of BP with home BP cuff, exercise, reduction of dietary salt, take medicines as prescribed, try not to miss doses and quit smoking.  Regular exercise and maintaining a healthy weight is needed.  Stress reduction may help. A CLINICAL SUMMARY including written plan identify barriers to care unique to individual due to social or  financial issues.  We attempt to mutually creat solutions for individual and family understanding. Thyroid disease  -     TSH Patient is known to have hypothyroid and is n treatment with levothroid 121mcg.  Patient was diagnosed 10 years ago.  Other treatment includes none.  Patient is compliant with medicines and last TSH 6 months ago.  Last TSH was normal.        Follow-up: Return in about 6 months (around 10/09/2021) for fasting.  An After Visit Summary was printed and given to the patient.  Reinaldo Meeker, MD [facility] [phone]

## 2021-04-10 LAB — CBC WITH DIFFERENTIAL/PLATELET
Basophils Absolute: 0.1 10*3/uL (ref 0.0–0.2)
Basos: 1 %
EOS (ABSOLUTE): 0.1 10*3/uL (ref 0.0–0.4)
Eos: 2 %
Hematocrit: 45.8 % (ref 34.0–46.6)
Hemoglobin: 15 g/dL (ref 11.1–15.9)
Immature Grans (Abs): 0.1 10*3/uL (ref 0.0–0.1)
Immature Granulocytes: 1 %
Lymphocytes Absolute: 1.4 10*3/uL (ref 0.7–3.1)
Lymphs: 23 %
MCH: 29.1 pg (ref 26.6–33.0)
MCHC: 32.8 g/dL (ref 31.5–35.7)
MCV: 89 fL (ref 79–97)
Monocytes Absolute: 0.6 10*3/uL (ref 0.1–0.9)
Monocytes: 10 %
Neutrophils Absolute: 3.9 10*3/uL (ref 1.4–7.0)
Neutrophils: 63 %
Platelets: 227 10*3/uL (ref 150–450)
RBC: 5.16 x10E6/uL (ref 3.77–5.28)
RDW: 14.9 % (ref 11.7–15.4)
WBC: 6.2 10*3/uL (ref 3.4–10.8)

## 2021-04-10 LAB — COMPREHENSIVE METABOLIC PANEL
ALT: 16 IU/L (ref 0–32)
AST: 15 IU/L (ref 0–40)
Albumin/Globulin Ratio: 1.8 (ref 1.2–2.2)
Albumin: 4.2 g/dL (ref 3.8–4.8)
Alkaline Phosphatase: 165 IU/L — ABNORMAL HIGH (ref 44–121)
BUN/Creatinine Ratio: 22 (ref 12–28)
BUN: 16 mg/dL (ref 8–27)
Bilirubin Total: 0.6 mg/dL (ref 0.0–1.2)
CO2: 21 mmol/L (ref 20–29)
Calcium: 9 mg/dL (ref 8.7–10.3)
Chloride: 103 mmol/L (ref 96–106)
Creatinine, Ser: 0.72 mg/dL (ref 0.57–1.00)
Globulin, Total: 2.4 g/dL (ref 1.5–4.5)
Glucose: 98 mg/dL (ref 65–99)
Potassium: 4.5 mmol/L (ref 3.5–5.2)
Sodium: 140 mmol/L (ref 134–144)
Total Protein: 6.6 g/dL (ref 6.0–8.5)
eGFR: 90 mL/min/{1.73_m2} (ref >59)

## 2021-04-10 LAB — TSH: TSH: 3.05 u[IU]/mL (ref 0.450–4.500)

## 2021-04-10 NOTE — Progress Notes (Signed)
CBC normal, kidney and liver tests normal, TSH 3.05 now nomral,  lp

## 2021-04-11 LAB — ANA,IFA RA DIAG PNL W/RFLX TIT/PATN
ANA Titer 1: NEGATIVE
Cyclic Citrullin Peptide Ab: 88 units — ABNORMAL HIGH (ref 0–19)
Rheumatoid fact SerPl-aCnc: <10 IU/mL (ref <14.0)

## 2021-04-12 NOTE — Progress Notes (Signed)
Rheumatoid arthritis panel negative, citrullin peptide 88 high which does suggest seronegative rheumatoid arthritis lp

## 2021-04-18 ENCOUNTER — Other Ambulatory Visit: Payer: Self-pay | Admitting: Legal Medicine

## 2021-04-18 DIAGNOSIS — F064 Anxiety disorder due to known physiological condition: Secondary | ICD-10-CM

## 2021-04-20 NOTE — Progress Notes (Deleted)
Chronic Care Management Pharmacy Note  04/20/2021 Name:  Victoria Pena MRN:  [id number] DOB:  1951-03-02  Subjective: Victoria Pena is an 70 y.o. year old female who is a primary patient of Henrene Pastor, Zeb Comfort, MD.  The CCM team was consulted for assistance with disease management and care coordination needs.    Engaged with patient by telephone for follow up visit in response to provider referral for pharmacy case management and/or care coordination services.   Consent to Services:  The patient was given information about Chronic Care Management services, agreed to services, and gave verbal consent prior to initiation of services.  Please see initial visit note for detailed documentation.   Patient Care Team: Lillard Anes, MD as PCP - General (Family Medicine) Burnice Logan, [facility] as Pharmacist (Pharmacist)  Recent office visits: *** 03/31/2021 - RA panel negative. 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 CBC, normal, kidney and liver tests normal. TSH 3.05.  03/23/2021 - continue on Breztri and start home nebulizer treatment for albuterol bid PRN. 03/17/2021 -  COPD with exacerbation. Finished z-pack. She remains dyspneic and having trouble breathing with chest tightness. No edema. Duoneb heped breathing. Clindamycin 150 mg tid. Breztri sample to use bid.  03/12/2021 - albuterol, z-pack and kenalog shot given for COPD exacerbation. 02/09/2021 - ear wax removal. Debrox to bilateral ear prn for ear wax buildup. Ear irrigation prn.   Recent consult visits: 03/27/2021 - Cardiothoracic - recommend evaluation by pulmonology.   02/23/2021 - cardiology - cardiac MRI ordered. Refer to CT surgery. Continue current antihypertensive regimen.  Hospital visits: None in previous 6 months  Objective:  Lab Results  Component Value Date   CREATININE 0.72 04/09/2021   BUN 16 04/09/2021   GFRNONAA >60 01/23/2021   GFRAA 106 12/23/2020   NA 140 04/09/2021    K 4.5 04/09/2021   CALCIUM 9.0 04/09/2021   CO2 21 04/09/2021   GLUCOSE 98 04/09/2021    No results found for: HGBA1C, FRUCTOSAMINE, GFR, MICROALBUR  Last diabetic Eye exam: No results found for: HMDIABEYEEXA  Last diabetic Foot exam: No results found for: HMDIABFOOTEX   No results found for: CHOL, HDL, LDLCALC, LDLDIRECT, TRIG, CHOLHDL  Hepatic Function Latest Ref Rng & Units 04/09/2021 01/23/2021 12/23/2020  Total Protein 6.0 - 8.5 g/dL 6.6 6.8 6.5  Albumin 3.8 - 4.8 g/dL 4.2 3.4(L) 3.8  AST 0 - 40 IU/L 15 13(L) 13  ALT 0 - 32 IU/L '16 12 8  ' Alk Phosphatase 44 - 121 IU/L 165(H) 167(H) 160(H)  Total Bilirubin 0.0 - 1.2 mg/dL 0.6 0.4 0.3    Lab Results  Component Value Date/Time   TSH 3.050 04/09/2021 02:07 PM   TSH 10.400 (H) 12/23/2020 11:23 AM   FREET4 1.41 03/24/2020 11:24 AM    CBC Latest Ref Rng & Units 04/09/2021 01/23/2021 12/23/2020  WBC 3.4 - 10.8 x10E3/uL 6.2 6.0 5.5  Hemoglobin 11.1 - 15.9 g/dL 15.0 12.4 13.2  Hematocrit 34.0 - 46.6 % 45.8 40.6 40.4  Platelets 150 - 450 x10E3/uL 227 256 264    No results found for: VD25OH  Clinical ASCVD: {YES/NO:21197} The ASCVD Risk score Mikey Bussing DC Jr., et al., 2013) failed to calculate for the following reasons:   Cannot find a previous HDL lab   Cannot find a previous total cholesterol lab    Depression screen [facility] 2/9 03/12/2021 10/29/2020 08/11/2020  Decreased Interest 0 1 3  Down, Depressed, Hopeless 0 1 2  PHQ - 2  Score 0 2 5  Altered sleeping 0 0 3  Tired, decreased energy 0 0 2  Change in appetite 0 0 2  Feeling bad or failure about yourself  0 0 0  Trouble concentrating 0 0 0  Moving slowly or fidgety/restless 0 0 0  Suicidal thoughts 0 0 0  PHQ-9 Score 0 2 12  Difficult doing work/chores Not difficult at all - Very difficult     ***Other: (CHADS2VASc if Afib, MMRC or CAT for COPD, ACT, DEXA)  Social History   Tobacco Use  Smoking Status Former Smoker  . Packs/day: 1.50  . Years: 35.00  . Pack years: 52.50   . Quit date: 07/27/1999  . Years since quitting: 21.7  Smokeless Tobacco Never Used   BP Readings from Last 3 Encounters:  04/09/21 102/70  03/27/21 119/79  03/23/21 118/64   Pulse Readings from Last 3 Encounters:  04/09/21 76  03/27/21 80  03/23/21 84   Wt Readings from Last 3 Encounters:  04/09/21 181 lb (82.1 kg)  03/27/21 180 lb (81.6 kg)  03/23/21 180 lb 9.6 oz (81.9 kg)   BMI Readings from Last 3 Encounters:  04/09/21 30.12 kg/m  03/27/21 29.95 kg/m  03/23/21 30.05 kg/m    Assessment/Interventions: Review of patient past medical history, allergies, medications, health status, including review of consultants reports, laboratory and other test data, was performed as part of comprehensive evaluation and provision of chronic care management services.   SDOH:  (Social Determinants of Health) assessments and interventions performed: {yes/no:20286}  SDOH Screenings   Alcohol Screen: Not on file  Depression (PHQ2-9): Low Risk   . PHQ-2 Score: 0  Financial Resource Strain: Not on file  Food Insecurity: No Food Insecurity  . Worried About Charity fundraiser in the Last Year: Never true  . Ran Out of Food in the Last Year: Never true  Housing: Low Risk   . Last Housing Risk Score: 0  Physical Activity: Not on file  Social Connections: Not on file  Stress: Not on file  Tobacco Use: Medium Risk  . Smoking Tobacco Use: Former Smoker  . Smokeless Tobacco Use: Never Used  Transportation Needs: Not on file    CCM Care Plan  Allergies  Allergen Reactions  . Dextromethorphan Hbr Shortness Of Breath  . Keflex [Cephalexin] Shortness Of Breath    Hard time breathing  . Robaxin [Methocarbamol] Nausea Only    Stomach pain . Could not eat  . Bactrim [Sulfamethoxazole-Trimethoprim] Itching  . Hydroxyzine Other (See Comments) and Swelling    Numbness in lips  . Prednisone & Diphenhydramine Other (See Comments)    Face flushed, heart racing  . Ciprofloxacin   .  Dextromethorphan Polistirex Er   . Meloxicam Other (See Comments)    Headache, body aches  . Nitrofurantoin   . Diphenhydramine Hcl Palpitations  . Moxifloxacin Rash  . Prednisone Other (See Comments) and Palpitations    "flushing"     Medications Reviewed Today    Reviewed by Lillard Anes, MD (Physician) on 04/09/21 at 1342  Med List Status: <None>  Medication Order Taking? Sig Documenting Provider Last Dose Status Informant  albuterol (VENTOLIN HFA) 108 (90 Base) MCG/ACT inhaler [id number] Yes Inhale 2 puffs into the lungs every 6 (six) hours as needed for wheezing or shortness of breath. Lillard Anes, MD Taking Active   ALPRAZolam Duanne Moron) 0.5 MG tablet [id number] Yes Take 1 tablet (0.5 mg total) by mouth 2 (two) times daily as needed for  anxiety. Lillard Anes, MD Taking Active   Budeson-Glycopyrrol-Formoterol 160-9-4.8 MCG/ACT Hollie Salk [id number] Yes Inhale 2 puffs into the lungs in the morning and at bedtime. Lillard Anes, MD Taking Active   buPROPion (WELLBUTRIN XL) 150 MG 24 hr tablet [id number] Yes Take 1 tablet (150 mg total) by mouth daily. Lillard Anes, MD Taking Active   calcium carbonate (TUMS - DOSED IN MG ELEMENTAL CALCIUM) 500 MG chewable tablet [id number] Yes Chew 1 tablet by mouth 2 (two) times daily as needed.  [provider] Taking Active   Ferrous Sulfate Dried (FERROUS SULFATE IRON PO) [id number] Yes Take 325 mg by mouth daily. [provider] Taking Active   ibuprofen (ADVIL,MOTRIN) 200 MG tablet [id number] Yes Take 600 mg by mouth 2 (two) times daily as needed for mild pain. [provider] Taking Active Self  levothyroxine (SYNTHROID) 137 MCG tablet [id number] Yes Take 1 tablet (137 mcg total) by mouth daily. Lillard Anes, MD Taking Active   losartan (COZAAR) 50 MG tablet [id number] Yes TAKE 1 TABLET(50 MG) BY MOUTH ONCE DAILY Lillard Anes, MD Taking Active   omeprazole (PRILOSEC) 40 MG  capsule [id number] Yes TAKE 1 CAPSULE ONE TIME DAILY BEFORE A MEAL Lillard Anes, MD Taking Active   pregabalin (LYRICA) 150 MG capsule [id number] Yes Take 1 capsule (150 mg total) by mouth 2 (two) times daily. Lillard Anes, MD Taking Active   traMADol Veatrice Bourbon) 50 MG tablet [id number] Yes Take 1 tablet (50 mg total) by mouth 4 (four) times daily. Lillard Anes, MD Taking Active           Patient Active Problem List   Diagnosis Date Noted  . Obstructive chronic bronchitis with exacerbation ([place]) 03/12/2021  . Thyroid disease   . Hypertension   . COPD (chronic obstructive pulmonary disease) ([place])   . Pericardial cyst 02/02/2021  . Toe ulcer, left, limited to breakdown of skin ([place]) 12/23/2020  . BMI 30.0-30.9,adult 12/23/2020  . Pharyngitis due to Streptococcus species 12/17/2020  . Cough 12/17/2020  . Cerumen impaction 06/04/2020  . Current mild episode of major depressive disorder ([place]) 03/24/2020  . Gastroesophageal reflux disease without esophagitis 03/24/2020  . Fibromyalgia 03/24/2020  . Arthropathy of lumbar facet joint 04/11/2019  . S/P lumbar spinal fusion 05/11/2018  . Anxiety 12/27/2016  . Benign essential hypertension 12/27/2016  . Depression 12/27/2016  . Graves disease 12/27/2016  . Palpitations 12/27/2016  . Adenocarcinoma of right lung, stage 1 ([place]) 07/26/2016  . Reactive airways dysfunction syndrome, unspecified asthma severity, uncomplicated ([place]) 93/90/3009  . S/P lobectomy of lung 07/26/2015  . HLD (hyperlipidemia) 07/22/2015  . Shortness of breath 06/10/2015  . Acquired spondylolisthesis 05/12/2015  . Postoperative hypothyroidism 01/17/2015    Immunization History  Administered Date(s) Administered  . Fluad Quad(high Dose 65+) 09/23/2020  . Influenza-Unspecified 08/25/2018  . Moderna SARS-COV2 Booster Vaccination 02/10/2021  . Moderna Sars-Covid-2 Vaccination 01/14/2020, 03/03/2020  . Pneumococcal Polysaccharide-23 09/23/2020     Conditions to be addressed/monitored:  Hypertension, GERD, COPD, Hypothyroidism and Depression  There are no care plans that you recently modified to display for this patient.    Medication Assistance: {MEDASSISTANCEINFO:25044}  Patient's preferred pharmacy is:  [facility] [place], [place] [address] [place] [address] Phone: [phone] Fax: [phone]  [facility] [place], [address] [place] [address] Phone: [phone]  Fax: [phone]  Uses pill box? {Yes or If no, why not?:20788} Pt endorses ***% compliance  We discussed: Current pharmacy is preferred with insurance plan and patient is satisfied with pharmacy services Patient decided to: Continue current medication management strategy  Care Plan and Follow Up Patient Decision:  Patient agrees to Care Plan and Follow-up.  Plan: Telephone follow up appointment with care management team member scheduled for:  ***   Current Barriers:  . {pharmacybarriers:24917}  Pharmacist Clinical Goal(s):  Marland Kitchen Patient will {PHARMACYGOALCHOICES:24921} through collaboration with PharmD and provider.   Interventions: . 1:1 collaboration with Lillard Anes, MD regarding development and update of comprehensive plan of care as evidenced by provider attestation and co-signature . Inter-disciplinary care team collaboration (see longitudinal plan of care) . Comprehensive medication review performed; medication list updated in electronic medical record  Hypertension (BP goal <140/90) -Controlled -Current treatment: . Losartan 50 mg daily  -Medications previously tried: ***  -Current home readings: *** -Current dietary habits: *** -Current exercise habits: *** -{ACTIONS;DENIES/REPORTS:21021675::"Denies"} hypotensive/hypertensive symptoms -Educated on {CCM BP  Counseling:25124} -Counseled to monitor BP at home ***, document, and provide log at future appointments -{CCMPHARMDINTERVENTION:25122}  COPD (Goal: control symptoms and prevent exacerbations) -{US controlled/uncontrolled:25276} -Current treatment  . ***Albuterol inhaler 2 puffs every 6 hours prn wheezing/shortness of breath . Breztri 2 puffs twice daily  -Medications previously tried: ***  -Gold Grade: {CHL HP [facility] Pharm COPD Gold YSAYT:0160109323} -Current COPD Classification:  {CHL HP Upstream Pharm COPD Classification:5140879495} -MMRC/CAT score: *** -Pulmonary function testing: *** -Exacerbations requiring treatment in last 6 months: *** -Patient {Actions; denies-reports:120008} consistent use of maintenance inhaler -Frequency of rescue inhaler use: *** -Counseled on {CCMINHALERCOUNSELING:25121} -{CCMPHARMDINTERVENTION:25122}  Depression/Anxiety (Goal: ***) -{US controlled/uncontrolled:25276} -Current treatment: . ***bupropion xl 150 mg daily . Alprazolam 0.5 mg bid prn  -Medications previously tried/failed: *** -PHQ9: *** -GAD7: *** -Connected with *** for mental health support -Educated on {CCM mental health counseling:25127} -{CCMPHARMDINTERVENTION:25122}  ***GERD (Goal: ***) -{US controlled/uncontrolled:25276} -Current treatment  . ***omeprazole 40 mg daily before a meal  -Medications previously tried: ***  -{CCMPHARMDINTERVENTION:25122}   *** (Goal: ***) -{US controlled/uncontrolled:25276} -Current treatment  . ***levothyroxine 137 mcg daily  -Medications previously tried: ***  -{CCMPHARMDINTERVENTION:25122}  Health Maintenance -Vaccine gaps: *** -Current therapy:  . *** -Educated on {ccm supplement counseling:25128} -{CCM Patient satisfied:25129} -{CCMPHARMDINTERVENTION:25122}   Patient Goals/Self-Care Activities . Patient will:  - {pharmacypatientgoals:24919}  Follow Up Plan: {CM FOLLOW UP FTDD:22025}

## 2021-04-21 ENCOUNTER — Telehealth: Payer: Medicare HMO

## 2021-04-24 ENCOUNTER — Other Ambulatory Visit: Payer: Self-pay

## 2021-04-24 DIAGNOSIS — J441 Chronic obstructive pulmonary disease with (acute) exacerbation: Secondary | ICD-10-CM

## 2021-04-24 MED ORDER — BUDESON-GLYCOPYRROL-FORMOTEROL 160-9-4.8 MCG/ACT IN AERO: 2.0000 | INHALATION_SPRAY | Freq: Two times a day (BID) | RESPIRATORY_TRACT | 6 refills | Status: DC

## 2021-04-27 ENCOUNTER — Other Ambulatory Visit: Payer: Self-pay | Admitting: Legal Medicine

## 2021-04-27 DIAGNOSIS — M1990 Unspecified osteoarthritis, unspecified site: Secondary | ICD-10-CM

## 2021-04-29 ENCOUNTER — Institutional Professional Consult (permissible substitution): Payer: Medicare HMO | Admitting: Pulmonary Disease

## 2021-04-29 DIAGNOSIS — J42 Unspecified chronic bronchitis: Secondary | ICD-10-CM | POA: Diagnosis not present

## 2021-05-04 ENCOUNTER — Telehealth: Payer: Self-pay

## 2021-05-04 NOTE — Progress Notes (Signed)
    Chronic Care Management Pharmacy Assistant   Name: Victoria Pena  MRN: [id number] DOB: [birth year]  Reason for Encounter: Medication Review for Breztri PAP    Medications: Outpatient Encounter Medications as of 05/04/2021  Medication Sig  . albuterol (VENTOLIN HFA) 108 (90 Base) MCG/ACT inhaler Inhale 2 puffs into the lungs every 6 (six) hours as needed for wheezing or shortness of breath.  . ALPRAZolam (XANAX) 0.5 MG tablet TAKE 1 TABLET(0.5 MG) BY MOUTH TWICE DAILY AS NEEDED FOR ANXIETY  . Budeson-Glycopyrrol-Formoterol 160-9-4.8 MCG/ACT AERO Inhale 2 puffs into the lungs in the morning and at bedtime.  Marland Kitchen buPROPion (WELLBUTRIN XL) 150 MG 24 hr tablet Take 1 tablet (150 mg total) by mouth daily.  . calcium carbonate (TUMS - DOSED IN MG ELEMENTAL CALCIUM) 500 MG chewable tablet Chew 1 tablet by mouth 2 (two) times daily as needed.   . Ferrous Sulfate Dried (FERROUS SULFATE IRON PO) Take 325 mg by mouth daily.  Marland Kitchen ibuprofen (ADVIL,MOTRIN) 200 MG tablet Take 600 mg by mouth 2 (two) times daily as needed for mild pain.  Marland Kitchen levothyroxine (SYNTHROID) 137 MCG tablet Take 1 tablet (137 mcg total) by mouth daily.  Marland Kitchen losartan (COZAAR) 50 MG tablet TAKE 1 TABLET(50 MG) BY MOUTH ONCE DAILY  . meloxicam (MOBIC) 15 MG tablet TAKE 1 TABLET(15 MG) BY MOUTH DAILY  . omeprazole (PRILOSEC) 40 MG capsule TAKE 1 CAPSULE ONE TIME DAILY BEFORE A MEAL  . pregabalin (LYRICA) 150 MG capsule Take 1 capsule (150 mg total) by mouth 2 (two) times daily.  . traMADol (ULTRAM) 50 MG tablet Take 1 tablet (50 mg total) by mouth 4 (four) times daily.   No facility-administered encounter medications on file as of 05/04/2021.    Donette Larry CPP, requested I complete a PAP form for [facility] for the patient.   I have completed the form and submitted to Donette Larry CPP for signatures and approval.  I left a message for the patient that she would need to come by the office to sign her forms, also to bring her insurance cards  and proof of income needed.  Clarita Leber, [place] Pharmacist Assistant [phone]

## 2021-05-05 ENCOUNTER — Other Ambulatory Visit: Payer: Self-pay | Admitting: Legal Medicine

## 2021-05-05 DIAGNOSIS — M4310 Spondylolisthesis, site unspecified: Secondary | ICD-10-CM

## 2021-05-20 ENCOUNTER — Encounter: Payer: Self-pay | Admitting: Pulmonary Disease

## 2021-05-20 ENCOUNTER — Other Ambulatory Visit: Payer: Self-pay

## 2021-05-20 ENCOUNTER — Ambulatory Visit (INDEPENDENT_AMBULATORY_CARE_PROVIDER_SITE_OTHER): Payer: Medicare HMO | Admitting: Pulmonary Disease

## 2021-05-20 VITALS — BP 126/80 | HR 82 | Temp 98.0°F | Ht 65.0 in | Wt 186.0 lb

## 2021-05-20 DIAGNOSIS — R911 Solitary pulmonary nodule: Secondary | ICD-10-CM | POA: Diagnosis not present

## 2021-05-20 DIAGNOSIS — Z902 Acquired absence of lung [part of]: Secondary | ICD-10-CM

## 2021-05-20 DIAGNOSIS — Z01818 Encounter for other preprocedural examination: Secondary | ICD-10-CM

## 2021-05-20 DIAGNOSIS — R0602 Shortness of breath: Secondary | ICD-10-CM | POA: Diagnosis not present

## 2021-05-20 DIAGNOSIS — Q248 Other specified congenital malformations of heart: Secondary | ICD-10-CM | POA: Diagnosis not present

## 2021-05-20 DIAGNOSIS — C3491 Malignant neoplasm of unspecified part of right bronchus or lung: Secondary | ICD-10-CM | POA: Diagnosis not present

## 2021-05-20 DIAGNOSIS — J432 Centrilobular emphysema: Secondary | ICD-10-CM

## 2021-05-20 NOTE — Progress Notes (Signed)
Synopsis: Referred in June for 2022 by Lajuana Matte, MD  Subjective:   PATIENT ID: Victoria Pena GENDER: female DOB: November 12, 1951, MRN: [id number]  Chief Complaint  Patient presents with  . Consult    SOB with exertion    70 yo FM, PMH of lung cancer, RUL lobectomy, in [place], 2016, was found incidentally on a CXR, followed by chemo. Was with her daughter at the time. She was originally living in [place] at the time. But was spending time in [place] with Daughter. Has had knee and back surgeries. She follows with Dr. Julien Nordmann in the cancer clinic. Was recently dx with a pericardial cyst and had surgical consultation with Dr. Kipp Brood. She has emphysema and she was placed on breztri and prn albuterol.    Past Medical History:  Diagnosis Date  . Adenocarcinoma of right lung, stage 1 ([place]) 07/26/2016  . Anxiety   . Benign essential hypertension 12/27/2016  . COPD (chronic obstructive pulmonary disease) ([place])    pt denies  . Depression   . Hypertension   . Pericardial cyst 02/02/2021  . Thyroid disease      Family History  Problem Relation Age of Onset  . Cancer Mother   . Cancer Father   . Cancer Brother      Past Surgical History:  Procedure Laterality Date  . ANKLE SURGERY     bilateral  . APPENDECTOMY    . CHOLECYSTECTOMY    . REPLACEMENT TOTAL KNEE Left   . THYROIDECTOMY      Social History   Socioeconomic History  . Marital status: Widowed    Spouse name: Not on file  . Number of children: Not on file  . Years of education: Not on file  . Highest education level: Not on file  Occupational History  . Not on file  Tobacco Use  . Smoking status: Former Smoker    Packs/day: 1.50    Years: 35.00    Pack years: 52.50    Quit date: 07/27/1999    Years since quitting: 21.8  . Smokeless tobacco: Never Used  Vaping Use  . Vaping Use: Never used  Substance and Sexual Activity  . Alcohol use: No  . Drug use: No  . Sexual activity: Not Currently  Other Topics Concern   . Not on file  Social History Narrative  . Not on file   Social Determinants of Health   Financial Resource Strain: Not on file  Food Insecurity: No Food Insecurity  . Worried About Charity fundraiser in the Last Year: Never true  . Ran Out of Food in the Last Year: Never true  Transportation Needs: Not on file  Physical Activity: Not on file  Stress: Not on file  Social Connections: Not on file  Intimate Partner Violence: Not on file     Allergies  Allergen Reactions  . Dextromethorphan Hbr Shortness Of Breath  . Keflex [Cephalexin] Shortness Of Breath    Hard time breathing  . Robaxin [Methocarbamol] Nausea Only    Stomach pain . Could not eat  . Bactrim [Sulfamethoxazole-Trimethoprim] Itching  . Hydroxyzine Other (See Comments) and Swelling    Numbness in lips  . Prednisone & Diphenhydramine Other (See Comments)    Face flushed, heart racing  . Ciprofloxacin   . Dextromethorphan Polistirex Er   . Meloxicam Other (See Comments)    Headache, body aches  . Nitrofurantoin   . Diphenhydramine Hcl Palpitations  . Moxifloxacin Rash  . Prednisone Other (  See Comments) and Palpitations    "flushing"      Outpatient Medications Prior to Visit  Medication Sig Dispense Refill  . albuterol (VENTOLIN HFA) 108 (90 Base) MCG/ACT inhaler Inhale 2 puffs into the lungs every 6 (six) hours as needed for wheezing or shortness of breath. 8 g 2  . ALPRAZolam (XANAX) 0.5 MG tablet TAKE 1 TABLET(0.5 MG) BY MOUTH TWICE DAILY AS NEEDED FOR ANXIETY 60 tablet 3  . Budeson-Glycopyrrol-Formoterol 160-9-4.8 MCG/ACT AERO Inhale 2 puffs into the lungs in the morning and at bedtime. 10.7 g 6  . buPROPion (WELLBUTRIN XL) 150 MG 24 hr tablet Take 1 tablet (150 mg total) by mouth daily. 90 tablet 2  . calcium carbonate (TUMS - DOSED IN MG ELEMENTAL CALCIUM) 500 MG chewable tablet Chew 1 tablet by mouth 2 (two) times daily as needed.     Marland Kitchen ibuprofen (ADVIL,MOTRIN) 200 MG tablet Take 600 mg by mouth 2  (two) times daily as needed for mild pain.    Marland Kitchen levothyroxine (SYNTHROID) 137 MCG tablet Take 1 tablet (137 mcg total) by mouth daily. 90 tablet 2  . losartan (COZAAR) 50 MG tablet TAKE 1 TABLET(50 MG) BY MOUTH ONCE DAILY 90 tablet 2  . meloxicam (MOBIC) 15 MG tablet TAKE 1 TABLET(15 MG) BY MOUTH DAILY 90 tablet 1  . omeprazole (PRILOSEC) 40 MG capsule TAKE 1 CAPSULE ONE TIME DAILY BEFORE A MEAL 90 capsule 2  . pregabalin (LYRICA) 150 MG capsule Take 1 capsule (150 mg total) by mouth 2 (two) times daily. 60 capsule 5  . traMADol (ULTRAM) 50 MG tablet TAKE 1 TABLET(50 MG) BY MOUTH FOUR TIMES DAILY 120 tablet 3  . Ferrous Sulfate Dried (FERROUS SULFATE IRON PO) Take 325 mg by mouth daily.     No facility-administered medications prior to visit.    Review of Systems  Constitutional: Negative for chills, fever, malaise/fatigue and weight loss.  HENT: Negative for hearing loss, sore throat and tinnitus.   Eyes: Negative for blurred vision and double vision.  Respiratory: Positive for shortness of breath. Negative for cough, hemoptysis, sputum production, wheezing and stridor.   Cardiovascular: Negative for chest pain, palpitations, orthopnea, leg swelling and PND.  Gastrointestinal: Negative for abdominal pain, constipation, diarrhea, heartburn, nausea and vomiting.  Genitourinary: Negative for dysuria, hematuria and urgency.  Musculoskeletal: Negative for joint pain and myalgias.  Skin: Negative for itching and rash.  Neurological: Negative for dizziness, tingling, weakness and headaches.  Endo/Heme/Allergies: Negative for environmental allergies. Does not bruise/bleed easily.  Psychiatric/Behavioral: Negative for depression. The patient is not nervous/anxious and does not have insomnia.   All other systems reviewed and are negative.    Objective:  Physical Exam Vitals reviewed.  Constitutional:      General: She is not in acute distress.    Appearance: She is well-developed. She is  obese.  HENT:     Head: Normocephalic and atraumatic.  Eyes:     General: No scleral icterus.    Conjunctiva/sclera: Conjunctivae normal.     Pupils: Pupils are equal, round, and reactive to light.  Neck:     Vascular: No JVD.     Trachea: No tracheal deviation.  Cardiovascular:     Rate and Rhythm: Normal rate and regular rhythm.     Heart sounds: Normal heart sounds. No murmur heard.   Pulmonary:     Effort: Pulmonary effort is normal. No tachypnea, accessory muscle usage or respiratory distress.     Breath sounds: No stridor. No wheezing,  rhonchi or rales.  Abdominal:     General: Bowel sounds are normal. There is no distension.     Palpations: Abdomen is soft.     Tenderness: There is no abdominal tenderness.  Musculoskeletal:        General: No tenderness.     Cervical back: Neck supple.  Lymphadenopathy:     Cervical: No cervical adenopathy.  Skin:    General: Skin is warm and dry.     Capillary Refill: Capillary refill takes less than 2 seconds.     Findings: No rash.  Neurological:     Mental Status: She is alert and oriented to person, place, and time.  Psychiatric:        Behavior: Behavior normal.      Vitals:   05/20/21 1543  BP: 126/80  Pulse: 82  Temp: 98 F (36.7 C)  SpO2: 96%  Weight: 186 lb (84.4 kg)  Height: 5\' 5"  (1.651 m)   96% on RA BMI Readings from Last 3 Encounters:  05/20/21 30.95 kg/m  04/09/21 30.12 kg/m  03/27/21 29.95 kg/m   Wt Readings from Last 3 Encounters:  05/20/21 186 lb (84.4 kg)  04/09/21 181 lb (82.1 kg)  03/27/21 180 lb (81.6 kg)     CBC    Component Value Date/Time   WBC 6.2 04/09/2021 1407   WBC 6.0 01/23/2021 1453   WBC 4.7 05/03/2018 0952   RBC 5.16 04/09/2021 1407   RBC 4.64 01/23/2021 1453   HGB 15.0 04/09/2021 1407   HGB 12.0 11/22/2017 1451   HCT 45.8 04/09/2021 1407   HCT 37.8 11/22/2017 1451   PLT 227 04/09/2021 1407   MCV 89 04/09/2021 1407   MCV 86.2 11/22/2017 1451   MCH 29.1 04/09/2021  1407   MCH 26.7 01/23/2021 1453   MCHC 32.8 04/09/2021 1407   MCHC 30.5 01/23/2021 1453   RDW 14.9 04/09/2021 1407   RDW 13.5 11/22/2017 1451   LYMPHSABS 1.4 04/09/2021 1407   LYMPHSABS 1.3 11/22/2017 1451   MONOABS 0.5 01/23/2021 1453   MONOABS 0.4 11/22/2017 1451   EOSABS 0.1 04/09/2021 1407   BASOSABS 0.1 04/09/2021 1407   BASOSABS 0.0 11/22/2017 1451    Chest Imaging: CT Chest 20/03/2021:  4.6 cm, percardial cyst.  Centrilobular emphysema RUL lobectomy  66mm, right lower lobe nodule  The patient's images have been independently reviewed by me.    Pulmonary Functions Testing Results: No flowsheet data found.  FeNO:   Pathology:   Echocardiogram:   Heart Catheterization:     Assessment & Plan:     ICD-10-CM   1. SOB (shortness of breath)  R06.02 Pulmonary Function Test    CT Chest Wo Contrast  2. Centrilobular emphysema (HCC)  J43.2 Pulmonary Function Test  3. Adenocarcinoma of right lung, stage 1 (HCC)  C34.91   4. Pericardial cyst  Q24.8 CT Chest Wo Contrast  5. S/P lobectomy of lung  Z90.2   6. Lung nodule  R91.1 CT Chest Wo Contrast  7. Pre-operative clearance  Z01.818     Discussion:  70 yo FM, PMH" syndrome of the lung right upper lobe status post lobectomy, [place], former smoker, centrilobular emphysema.  Has a enlarging pericardial cyst.  Seen today for perioperative respiratory evaluation.  Plan: Full PFTs Lung nodule, CT scan follow-up August 2022. Continued triple therapy inhaler regimen plus as needed albuterol. We will review PFTs at the next office visit.    Current Outpatient Medications:  .  albuterol (VENTOLIN HFA) 108 (  90 Base) MCG/ACT inhaler, Inhale 2 puffs into the lungs every 6 (six) hours as needed for wheezing or shortness of breath., Disp: 8 g, Rfl: 2 .  ALPRAZolam (XANAX) 0.5 MG tablet, TAKE 1 TABLET(0.5 MG) BY MOUTH TWICE DAILY AS NEEDED FOR ANXIETY, Disp: 60 tablet, Rfl: 3 .  Budeson-Glycopyrrol-Formoterol 160-9-4.8  MCG/ACT AERO, Inhale 2 puffs into the lungs in the morning and at bedtime., Disp: 10.7 g, Rfl: 6 .  buPROPion (WELLBUTRIN XL) 150 MG 24 hr tablet, Take 1 tablet (150 mg total) by mouth daily., Disp: 90 tablet, Rfl: 2 .  calcium carbonate (TUMS - DOSED IN MG ELEMENTAL CALCIUM) 500 MG chewable tablet, Chew 1 tablet by mouth 2 (two) times daily as needed. , Disp: , Rfl:  .  ibuprofen (ADVIL,MOTRIN) 200 MG tablet, Take 600 mg by mouth 2 (two) times daily as needed for mild pain., Disp: , Rfl:  .  levothyroxine (SYNTHROID) 137 MCG tablet, Take 1 tablet (137 mcg total) by mouth daily., Disp: 90 tablet, Rfl: 2 .  losartan (COZAAR) 50 MG tablet, TAKE 1 TABLET(50 MG) BY MOUTH ONCE DAILY, Disp: 90 tablet, Rfl: 2 .  meloxicam (MOBIC) 15 MG tablet, TAKE 1 TABLET(15 MG) BY MOUTH DAILY, Disp: 90 tablet, Rfl: 1 .  omeprazole (PRILOSEC) 40 MG capsule, TAKE 1 CAPSULE ONE TIME DAILY BEFORE A MEAL, Disp: 90 capsule, Rfl: 2 .  pregabalin (LYRICA) 150 MG capsule, Take 1 capsule (150 mg total) by mouth 2 (two) times daily., Disp: 60 capsule, Rfl: 5 .  traMADol (ULTRAM) 50 MG tablet, TAKE 1 TABLET(50 MG) BY MOUTH FOUR TIMES DAILY, Disp: 120 tablet, Rfl: 3  I spent 45 minutes dedicated to the care of this patient on the date of this encounter to include pre-visit review of records, face-to-face time with the patient discussing conditions above, post visit ordering of testing, clinical documentation with the electronic health record, making appropriate referrals as documented, and communicating necessary findings to members of the patients care team.   Garner Nash, DO [facility] 05/20/2021 3:58 PM

## 2021-05-20 NOTE — Patient Instructions (Signed)
Thank you for visiting Dr. Valeta Harms at [facility]. Today we recommend the following:  Orders Placed This Encounter  Procedures  . CT Chest Wo Contrast  . Pulmonary Function Test   PFTS next available appt and follow up with me or app   CT Chest in August 2022 for follow up of nodule.   Return in about 4 weeks (around 06/17/2021) for with APP or Dr. Valeta Harms.    Please do your part to reduce the spread of COVID-19.

## 2021-05-26 DIAGNOSIS — H25813 Combined forms of age-related cataract, bilateral: Secondary | ICD-10-CM | POA: Diagnosis not present

## 2021-06-01 ENCOUNTER — Other Ambulatory Visit: Payer: Self-pay | Admitting: Legal Medicine

## 2021-06-10 ENCOUNTER — Ambulatory Visit
Admission: RE | Admit: 2021-06-10 | Discharge: 2021-06-10 | Disposition: A | Discharge status: Home / Self Care | Payer: Medicare HMO | Source: Ambulatory Visit | Attending: Pulmonary Disease | Admitting: Pulmonary Disease

## 2021-06-10 DIAGNOSIS — E890 Postprocedural hypothyroidism: Secondary | ICD-10-CM | POA: Diagnosis not present

## 2021-06-10 DIAGNOSIS — Q248 Other specified congenital malformations of heart: Secondary | ICD-10-CM

## 2021-06-10 DIAGNOSIS — R0602 Shortness of breath: Secondary | ICD-10-CM

## 2021-06-10 DIAGNOSIS — J432 Centrilobular emphysema: Secondary | ICD-10-CM | POA: Diagnosis not present

## 2021-06-10 DIAGNOSIS — I2510 Atherosclerotic heart disease of native coronary artery without angina pectoris: Secondary | ICD-10-CM | POA: Diagnosis not present

## 2021-06-10 DIAGNOSIS — R911 Solitary pulmonary nodule: Secondary | ICD-10-CM

## 2021-06-10 DIAGNOSIS — J9811 Atelectasis: Secondary | ICD-10-CM | POA: Diagnosis not present

## 2021-06-15 ENCOUNTER — Other Ambulatory Visit: Payer: Self-pay

## 2021-06-15 ENCOUNTER — Other Ambulatory Visit: Payer: Self-pay | Admitting: Legal Medicine

## 2021-06-15 ENCOUNTER — Ambulatory Visit
Admission: RE | Admit: 2021-06-15 | Discharge: 2021-06-15 | Disposition: A | Discharge status: Home / Self Care | Payer: Medicare HMO | Source: Ambulatory Visit | Attending: Legal Medicine | Admitting: Legal Medicine

## 2021-06-15 ENCOUNTER — Ambulatory Visit (INDEPENDENT_AMBULATORY_CARE_PROVIDER_SITE_OTHER): Payer: Medicare HMO | Admitting: Legal Medicine

## 2021-06-15 ENCOUNTER — Encounter: Payer: Self-pay | Admitting: Legal Medicine

## 2021-06-15 VITALS — BP 120/64 | HR 81 | Temp 97.6°F | Resp 16 | Ht 65.0 in | Wt 186.0 lb

## 2021-06-15 DIAGNOSIS — Z1231 Encounter for screening mammogram for malignant neoplasm of breast: Secondary | ICD-10-CM | POA: Diagnosis not present

## 2021-06-15 DIAGNOSIS — N3000 Acute cystitis without hematuria: Secondary | ICD-10-CM | POA: Diagnosis not present

## 2021-06-15 DIAGNOSIS — N951 Menopausal and female climacteric states: Secondary | ICD-10-CM | POA: Diagnosis not present

## 2021-06-15 LAB — POCT URINALYSIS DIP (CLINITEK)
Blood, UA: NEGATIVE
Glucose, UA: NEGATIVE mg/dL
Ketones, POC UA: NEGATIVE mg/dL
Nitrite, UA: POSITIVE — AB
Spec Grav, UA: >=1.03 — AB (ref 1.010–1.025)
Urobilinogen, UA: 0.2 E.U./dL
pH, UA: 6 (ref 5.0–8.0)

## 2021-06-15 MED ORDER — DOXYCYCLINE HYCLATE 100 MG PO TABS: 100.0000 mg | ORAL_TABLET | Freq: Two times a day (BID) | ORAL | 0 refills | Status: DC

## 2021-06-15 NOTE — Progress Notes (Signed)
Established Patient Office Visit  Subjective:  Patient ID: Victoria Pena, female    DOB: August 25, 1951  Age: 70 y.o. MRN: [id number]  CC:  Chief Complaint  Patient presents with   Urinary Tract Infection    HPI Victoria Pena [facility] presents for uti.  She had dysuria for one week.  No fever or chills  She has recurrence of cancer. We discussed  Past Medical History:  Diagnosis Date   Adenocarcinoma of right lung, stage 1 ([place]) 07/26/2016   Anxiety    Benign essential hypertension 12/27/2016   COPD (chronic obstructive pulmonary disease) ([place])    pt denies   Depression    Hypertension    Pericardial cyst 02/02/2021   Thyroid disease     Past Surgical History:  Procedure Laterality Date   ANKLE SURGERY     bilateral   APPENDECTOMY     CHOLECYSTECTOMY     REPLACEMENT TOTAL KNEE Left    THYROIDECTOMY      Family History  Problem Relation Age of Onset   Cancer Mother    Cancer Father    Cancer Brother     Social History   Socioeconomic History   Marital status: Widowed    Spouse name: Not on file   Number of children: Not on file   Years of education: Not on file   Highest education level: Not on file  Occupational History   Not on file  Tobacco Use   Smoking status: Former    Packs/day: 1.50    Years: 35.00    Pack years: 52.50    Types: Cigarettes    Quit date: 07/27/1999    Years since quitting: 21.9   Smokeless tobacco: Never  Vaping Use   Vaping Use: Never used  Substance and Sexual Activity   Alcohol use: No   Drug use: No   Sexual activity: Not Currently  Other Topics Concern   Not on file  Social History Narrative   Not on file   Social Determinants of Health   Financial Resource Strain: Not on file  Food Insecurity: No Food Insecurity   Worried About Charity fundraiser in the Last Year: Never true   Ran Out of Food in the Last Year: Never true  Transportation Needs: Not on file  Physical Activity: Not on file  Stress: Not on file   Social Connections: Not on file  Intimate Partner Violence: Not on file    Outpatient Medications Prior to Visit  Medication Sig Dispense Refill   albuterol (VENTOLIN HFA) 108 (90 Base) MCG/ACT inhaler Inhale 2 puffs into the lungs every 6 (six) hours as needed for wheezing or shortness of breath. 8 g 2   ALPRAZolam (XANAX) 0.5 MG tablet TAKE 1 TABLET(0.5 MG) BY MOUTH TWICE DAILY AS NEEDED FOR ANXIETY 60 tablet 3   Budeson-Glycopyrrol-Formoterol 160-9-4.8 MCG/ACT AERO Inhale 2 puffs into the lungs in the morning and at bedtime. 10.7 g 6   buPROPion (WELLBUTRIN XL) 150 MG 24 hr tablet Take 1 tablet (150 mg total) by mouth daily. 90 tablet 2   calcium carbonate (TUMS - DOSED IN MG ELEMENTAL CALCIUM) 500 MG chewable tablet Chew 1 tablet by mouth 2 (two) times daily as needed.      ibuprofen (ADVIL,MOTRIN) 200 MG tablet Take 600 mg by mouth 2 (two) times daily as needed for mild pain.     levothyroxine (SYNTHROID) 137 MCG tablet Take 1 tablet (137 mcg total) by mouth daily. 90 tablet 2  losartan (COZAAR) 50 MG tablet TAKE 1 TABLET EVERY DAY 90 tablet 2   meloxicam (MOBIC) 15 MG tablet TAKE 1 TABLET(15 MG) BY MOUTH DAILY 90 tablet 1   omeprazole (PRILOSEC) 40 MG capsule TAKE 1 CAPSULE ONE TIME DAILY BEFORE A MEAL 90 capsule 2   pregabalin (LYRICA) 150 MG capsule Take 1 capsule (150 mg total) by mouth 2 (two) times daily. 60 capsule 5   traMADol (ULTRAM) 50 MG tablet TAKE 1 TABLET(50 MG) BY MOUTH FOUR TIMES DAILY 120 tablet 3   No facility-administered medications prior to visit.    Allergies  Allergen Reactions   Dextromethorphan Hbr Shortness Of Breath   Keflex [Cephalexin] Shortness Of Breath    Hard time breathing   Robaxin [Methocarbamol] Nausea Only    Stomach pain . Could not eat   Bactrim [Sulfamethoxazole-Trimethoprim] Itching   Hydroxyzine Other (See Comments) and Swelling    Numbness in lips   Prednisone & Diphenhydramine Other (See Comments)    Face flushed, heart racing    Ciprofloxacin    Dextromethorphan Polistirex Er    Meloxicam Other (See Comments)    Headache, body aches   Nitrofurantoin    Diphenhydramine Hcl Palpitations   Moxifloxacin Rash   Prednisone Other (See Comments) and Palpitations    "flushing"     ROS Review of Systems  Constitutional:  Negative for activity change and appetite change.  HENT:  Negative for congestion.   Eyes:  Negative for visual disturbance.  Respiratory:  Negative for chest tightness and shortness of breath.   Cardiovascular:  Negative for chest pain, palpitations and leg swelling.  Gastrointestinal:  Negative for abdominal distention and abdominal pain.  Endocrine: Negative for polyuria.  Genitourinary:  Positive for dysuria. Negative for difficulty urinating.  Musculoskeletal:  Negative for arthralgias and back pain.  Neurological: Negative.   Psychiatric/Behavioral: Negative.       Objective:    Physical Exam  BP 120/64   Pulse 81   Temp 97.6 F (36.4 C)   Resp 16   Ht 5' 5" (1.651 m)   Wt 186 lb (84.4 kg)   LMP  (LMP Unknown)   SpO2 96%   BMI 30.95 kg/m  Wt Readings from Last 3 Encounters:  06/15/21 186 lb (84.4 kg)  05/20/21 186 lb (84.4 kg)  04/09/21 181 lb (82.1 kg)     Health Maintenance Due  Topic Date Due   Hepatitis C Screening  Never done   TETANUS/TDAP  Never done   Zoster Vaccines- Shingrix (1 of 2) Never done   COLONOSCOPY (Pts 45-36yr Insurance coverage will need to be confirmed)  Never done   COVID-19 Vaccine (4 - Booster for Moderna series) 05/10/2021    There are no preventive care reminders to display for this patient.  Lab Results  Component Value Date   TSH 3.050 04/09/2021   Lab Results  Component Value Date   WBC 6.2 04/09/2021   HGB 15.0 04/09/2021   HCT 45.8 04/09/2021   MCV 89 04/09/2021   PLT 227 04/09/2021   Lab Results  Component Value Date   NA 140 04/09/2021   K 4.5 04/09/2021   CHLORIDE 107 11/22/2017   CO2 21 04/09/2021   GLUCOSE 98  04/09/2021   BUN 16 04/09/2021   CREATININE 0.72 04/09/2021   BILITOT 0.6 04/09/2021   ALKPHOS 165 (H) 04/09/2021   AST 15 04/09/2021   ALT 16 04/09/2021   PROT 6.6 04/09/2021   ALBUMIN 4.2 04/09/2021   CALCIUM  9.0 04/09/2021   ANIONGAP 8 01/23/2021   EGFR 90 04/09/2021   No results found for: CHOL No results found for: HDL No results found for: LDLCALC No results found for: TRIG No results found for: CHOLHDL No results found for: HGBA1C    Assessment & Plan:   Problem List Items Addressed This Visit   None Visit Diagnoses     Acute cystitis without hematuria    -  Primary   Relevant Orders   POCT URINALYSIS DIP (CLINITEK)   Urine Culture Treat with doxycycline 146m bid, culture urine         Follow-up: Return if symptoms worsen or fail to improve.    LReinaldo Meeker MD

## 2021-06-17 NOTE — Progress Notes (Signed)
Normal mammogram ?lp

## 2021-06-19 LAB — URINE CULTURE

## 2021-06-21 ENCOUNTER — Other Ambulatory Visit: Payer: Self-pay | Admitting: Legal Medicine

## 2021-06-21 DIAGNOSIS — N3000 Acute cystitis without hematuria: Secondary | ICD-10-CM

## 2021-06-21 DIAGNOSIS — M797 Fibromyalgia: Secondary | ICD-10-CM

## 2021-06-21 MED ORDER — AMOXICILLIN-POT CLAVULANATE 875-125 MG PO TABS: 1.0000 | ORAL_TABLET | Freq: Two times a day (BID) | ORAL | 0 refills | Status: DC

## 2021-06-21 NOTE — Progress Notes (Signed)
Urine grew E. Coli- resistant to doxycycline, change to augmentin- called in lp

## 2021-06-24 DIAGNOSIS — S20212A Contusion of left front wall of thorax, initial encounter: Secondary | ICD-10-CM | POA: Diagnosis not present

## 2021-06-29 ENCOUNTER — Telehealth: Payer: Self-pay | Admitting: Legal Medicine

## 2021-06-29 ENCOUNTER — Other Ambulatory Visit (HOSPITAL_COMMUNITY): Payer: Medicare HMO

## 2021-06-29 NOTE — Telephone Encounter (Signed)
   Victoria Pena has been scheduled for the following appointment:  WHAT: DEXA WHERE: [facility] DATE: 07/31/21 TIME: 8:30am arrival time  Patient has been made aware.

## 2021-06-30 ENCOUNTER — Other Ambulatory Visit: Payer: Self-pay

## 2021-06-30 ENCOUNTER — Ambulatory Visit (INDEPENDENT_AMBULATORY_CARE_PROVIDER_SITE_OTHER): Payer: Medicare HMO | Admitting: Legal Medicine

## 2021-06-30 ENCOUNTER — Encounter: Payer: Self-pay | Admitting: Legal Medicine

## 2021-06-30 VITALS — BP 110/74 | HR 72 | Temp 97.4°F | Ht 65.0 in | Wt 186.6 lb

## 2021-06-30 DIAGNOSIS — S2232XA Fracture of one rib, left side, initial encounter for closed fracture: Secondary | ICD-10-CM | POA: Diagnosis not present

## 2021-06-30 MED ORDER — HYDROCODONE-ACETAMINOPHEN 10-325 MG PO TABS: 1.0000 | ORAL_TABLET | Freq: Three times a day (TID) | ORAL | 0 refills | Status: AC | PRN

## 2021-06-30 NOTE — Progress Notes (Signed)
Established Patient Office Visit  Subjective:  Patient ID: Victoria Pena, female    DOB: 1951/03/13  Age: 70 y.o. MRN: [id number]  CC:  Chief Complaint  Patient presents with   Pain    Rib pain     HPI Victoria Pena [facility] presents for left rib pain, injured rib trying to get dog off bed. 06/21/2021.  Pain breathing , no cough, pain sneezing. She went to Urgent care , no x-ray.no pain medicines.  Past Medical History:  Diagnosis Date   Adenocarcinoma of right lung, stage 1 ([place]) 07/26/2016   Anxiety    Benign essential hypertension 12/27/2016   COPD (chronic obstructive pulmonary disease) ([place])    pt denies   Depression    Hypertension    Pericardial cyst 02/02/2021   Thyroid disease     Past Surgical History:  Procedure Laterality Date   ANKLE SURGERY     bilateral   APPENDECTOMY     BREAST BIOPSY Left 02/25/2020   BREAST BIOPSY Right 08/17/2018   BREAST EXCISIONAL BIOPSY Right    unsure when but marked with scar marker   CHOLECYSTECTOMY     REPLACEMENT TOTAL KNEE Left    THYROIDECTOMY      Family History  Problem Relation Age of Onset   Cancer Mother    Cancer Father    Cancer Brother     Social History   Socioeconomic History   Marital status: Widowed    Spouse name: Not on file   Number of children: Not on file   Years of education: Not on file   Highest education level: Not on file  Occupational History   Not on file  Tobacco Use   Smoking status: Former    Packs/day: 1.50    Years: 35.00    Pack years: 52.50    Types: Cigarettes    Quit date: 07/27/1999    Years since quitting: 21.9   Smokeless tobacco: Never  Vaping Use   Vaping Use: Never used  Substance and Sexual Activity   Alcohol use: No   Drug use: No   Sexual activity: Not Currently  Other Topics Concern   Not on file  Social History Narrative   Not on file   Social Determinants of Health   Financial Resource Strain: Not on file  Food Insecurity: No Food Insecurity   Worried  About Charity fundraiser in the Last Year: Never true   Ran Out of Food in the Last Year: Never true  Transportation Needs: Not on file  Physical Activity: Not on file  Stress: Not on file  Social Connections: Not on file  Intimate Partner Violence: Not on file    Outpatient Medications Prior to Visit  Medication Sig Dispense Refill   albuterol (VENTOLIN HFA) 108 (90 Base) MCG/ACT inhaler Inhale 2 puffs into the lungs every 6 (six) hours as needed for wheezing or shortness of breath. 8 g 2   ALPRAZolam (XANAX) 0.5 MG tablet TAKE 1 TABLET(0.5 MG) BY MOUTH TWICE DAILY AS NEEDED FOR ANXIETY 60 tablet 3   amoxicillin-clavulanate (AUGMENTIN) 875-125 MG tablet Take 1 tablet by mouth 2 (two) times daily. 20 tablet 0   Budeson-Glycopyrrol-Formoterol 160-9-4.8 MCG/ACT AERO Inhale 2 puffs into the lungs in the morning and at bedtime. 10.7 g 6   buPROPion (WELLBUTRIN XL) 150 MG 24 hr tablet Take 1 tablet (150 mg total) by mouth daily. 90 tablet 2   calcium carbonate (TUMS - DOSED IN MG ELEMENTAL CALCIUM)  500 MG chewable tablet Chew 1 tablet by mouth 2 (two) times daily as needed.      ibuprofen (ADVIL,MOTRIN) 200 MG tablet Take 600 mg by mouth 2 (two) times daily as needed for mild pain.     levothyroxine (SYNTHROID) 137 MCG tablet Take 1 tablet (137 mcg total) by mouth daily. 90 tablet 2   losartan (COZAAR) 50 MG tablet TAKE 1 TABLET EVERY DAY 90 tablet 2   meloxicam (MOBIC) 15 MG tablet TAKE 1 TABLET(15 MG) BY MOUTH DAILY 90 tablet 1   omeprazole (PRILOSEC) 40 MG capsule TAKE 1 CAPSULE ONE TIME DAILY BEFORE A MEAL 90 capsule 2   pregabalin (LYRICA) 150 MG capsule TAKE 1 CAPSULE(150 MG) BY MOUTH TWICE DAILY 60 capsule 5   traMADol (ULTRAM) 50 MG tablet TAKE 1 TABLET(50 MG) BY MOUTH FOUR TIMES DAILY 120 tablet 3   doxycycline (VIBRA-TABS) 100 MG tablet Take 1 tablet (100 mg total) by mouth 2 (two) times daily. 20 tablet 0   No facility-administered medications prior to visit.    Allergies   Allergen Reactions   Dextromethorphan Hbr Shortness Of Breath   Keflex [Cephalexin] Shortness Of Breath    Hard time breathing   Robaxin [Methocarbamol] Nausea Only    Stomach pain . Could not eat   Bactrim [Sulfamethoxazole-Trimethoprim] Itching   Hydroxyzine Other (See Comments) and Swelling    Numbness in lips   Prednisone & Diphenhydramine Other (See Comments)    Face flushed, heart racing   Ciprofloxacin    Dextromethorphan Polistirex Er    Meloxicam Other (See Comments)    Headache, body aches   Nitrofurantoin    Diphenhydramine Hcl Palpitations   Moxifloxacin Rash   Prednisone Other (See Comments) and Palpitations    "flushing"     ROS Review of Systems  Constitutional:  Negative for activity change and appetite change.  HENT:  Negative for congestion.   Eyes:  Negative for visual disturbance.  Respiratory:  Negative for chest tightness and shortness of breath.   Cardiovascular:  Positive for chest pain (left chest wall). Negative for palpitations and leg swelling.  Gastrointestinal:  Negative for abdominal distention and abdominal pain.  Genitourinary: Negative.   Musculoskeletal:  Positive for arthralgias.  Neurological: Negative.   Psychiatric/Behavioral: Negative.       Objective:    Physical Exam Vitals reviewed.  Constitutional:      Appearance: Normal appearance.  HENT:     Right Ear: Tympanic membrane normal.     Left Ear: Tympanic membrane normal.     Nose: Nose normal.     Mouth/Throat:     Mouth: Mucous membranes are dry.  Eyes:     Extraocular Movements: Extraocular movements intact.     Conjunctiva/sclera: Conjunctivae normal.     Pupils: Pupils are equal, round, and reactive to light.  Cardiovascular:     Rate and Rhythm: Normal rate and regular rhythm.     Pulses: Normal pulses.     Heart sounds: Normal heart sounds. No murmur heard.   No gallop.  Pulmonary:     Effort: Pulmonary effort is normal. No respiratory distress.     Breath  sounds: Normal breath sounds. No wheezing.  Chest:     Chest wall: Tenderness (left chest wall pain) present.  Abdominal:     General: Abdomen is flat. Bowel sounds are normal. There is no distension.     Palpations: Abdomen is soft.     Tenderness: There is no abdominal tenderness.  Musculoskeletal:  General: Normal range of motion.     Cervical back: Normal range of motion.  Skin:    General: Skin is warm.     Capillary Refill: Capillary refill takes less than 2 seconds.  Neurological:     General: No focal deficit present.     Mental Status: She is alert and oriented to person, place, and time.  Psychiatric:        Mood and Affect: Mood normal.    BP 110/74   Pulse 72   Temp (!) 97.4 F (36.3 C)   Ht '5\' 5"'  (1.651 m)   Wt 186 lb 9.6 oz (84.6 kg)   LMP  (LMP Unknown)   SpO2 96%   BMI 31.05 kg/m  Wt Readings from Last 3 Encounters:  06/30/21 186 lb 9.6 oz (84.6 kg)  06/15/21 186 lb (84.4 kg)  05/20/21 186 lb (84.4 kg)     Health Maintenance Due  Topic Date Due   Hepatitis C Screening  Never done   TETANUS/TDAP  Never done   Zoster Vaccines- Shingrix (1 of 2) Never done   COLONOSCOPY (Pts 45-22yr Insurance coverage will need to be confirmed)  Never done   COVID-19 Vaccine (4 - Booster for Moderna series) 05/10/2021    There are no preventive care reminders to display for this patient.  Lab Results  Component Value Date   TSH 3.050 04/09/2021   Lab Results  Component Value Date   WBC 6.2 04/09/2021   HGB 15.0 04/09/2021   HCT 45.8 04/09/2021   MCV 89 04/09/2021   PLT 227 04/09/2021   Lab Results  Component Value Date   NA 140 04/09/2021   K 4.5 04/09/2021   CHLORIDE 107 11/22/2017   CO2 21 04/09/2021   GLUCOSE 98 04/09/2021   BUN 16 04/09/2021   CREATININE 0.72 04/09/2021   BILITOT 0.6 04/09/2021   ALKPHOS 165 (H) 04/09/2021   AST 15 04/09/2021   ALT 16 04/09/2021   PROT 6.6 04/09/2021   ALBUMIN 4.2 04/09/2021   CALCIUM 9.0 04/09/2021    ANIONGAP 8 01/23/2021   EGFR 90 04/09/2021   No results found for: CHOL No results found for: HDL No results found for: LDLCALC No results found for: TRIG No results found for: CHOLHDL No results found for: HGBA1C    Assessment & Plan:   Diagnoses and all orders for this visit: Closed fracture of one rib of left side, initial encounter -     DG Chest 2 View -     DG Ribs Unilateral Left -     HYDROcodone-acetaminophen (NORCO) 10-325 MG tablet; Take 1 tablet by mouth every 8 (eight) hours as needed for up to 5 days.  Get x-ray of ribs, hydrocodone for pain.  Note for work    Follow-up: Return in about 2 weeks (around 07/14/2021) for rib pain.    LReinaldo Meeker MD

## 2021-07-01 ENCOUNTER — Ambulatory Visit: Payer: Medicare HMO | Admitting: Pulmonary Disease

## 2021-07-01 ENCOUNTER — Ambulatory Visit: Payer: Medicare HMO | Admitting: Legal Medicine

## 2021-07-06 ENCOUNTER — Other Ambulatory Visit: Payer: Self-pay

## 2021-07-06 DIAGNOSIS — M1990 Unspecified osteoarthritis, unspecified site: Secondary | ICD-10-CM

## 2021-07-06 MED ORDER — MELOXICAM 15 MG PO TABS: ORAL_TABLET | ORAL | 1 refills | Status: DC

## 2021-07-08 ENCOUNTER — Encounter: Payer: Self-pay | Admitting: Legal Medicine

## 2021-07-08 ENCOUNTER — Other Ambulatory Visit: Payer: Self-pay

## 2021-07-08 ENCOUNTER — Ambulatory Visit (INDEPENDENT_AMBULATORY_CARE_PROVIDER_SITE_OTHER): Payer: Medicare HMO | Admitting: Legal Medicine

## 2021-07-08 VITALS — BP 124/80 | HR 88 | Temp 96.1°F | Resp 16 | Ht 65.0 in | Wt 186.0 lb

## 2021-07-08 DIAGNOSIS — C3491 Malignant neoplasm of unspecified part of right bronchus or lung: Secondary | ICD-10-CM

## 2021-07-08 DIAGNOSIS — I10 Essential (primary) hypertension: Secondary | ICD-10-CM

## 2021-07-08 DIAGNOSIS — N3090 Cystitis, unspecified without hematuria: Secondary | ICD-10-CM

## 2021-07-08 DIAGNOSIS — E890 Postprocedural hypothyroidism: Secondary | ICD-10-CM | POA: Diagnosis not present

## 2021-07-08 DIAGNOSIS — E079 Disorder of thyroid, unspecified: Secondary | ICD-10-CM

## 2021-07-08 DIAGNOSIS — I700 Atherosclerosis of aorta: Secondary | ICD-10-CM | POA: Insufficient documentation

## 2021-07-08 DIAGNOSIS — K219 Gastro-esophageal reflux disease without esophagitis: Secondary | ICD-10-CM | POA: Diagnosis not present

## 2021-07-08 DIAGNOSIS — M797 Fibromyalgia: Secondary | ICD-10-CM | POA: Diagnosis not present

## 2021-07-08 DIAGNOSIS — J432 Centrilobular emphysema: Secondary | ICD-10-CM

## 2021-07-08 HISTORY — DX: Atherosclerosis of aorta: I70.0

## 2021-07-08 LAB — POCT URINALYSIS DIP (CLINITEK)
Bilirubin, UA: NEGATIVE
Glucose, UA: NEGATIVE mg/dL
Ketones, POC UA: NEGATIVE mg/dL
Nitrite, UA: NEGATIVE
POC PROTEIN,UA: 30 — AB
Spec Grav, UA: >=1.03 — AB (ref 1.010–1.025)
Urobilinogen, UA: 0.2 E.U./dL
pH, UA: 5.5 (ref 5.0–8.0)

## 2021-07-08 MED ORDER — AMOXICILLIN-POT CLAVULANATE 875-125 MG PO TABS
1.0000 | ORAL_TABLET | Freq: Two times a day (BID) | ORAL | 0 refills | Status: DC
Start: 2021-07-08 — End: 2021-08-03

## 2021-07-08 NOTE — Progress Notes (Signed)
Established Patient Office Visit  Subjective:  Patient ID: Victoria Pena, female    DOB: [birth year]  Age: 70 y.o. MRN: [id number]  CC:  Chief Complaint  Patient presents with   Hypertension   Gastroesophageal Reflux   Hypothyroidism    HPI Victoria Pena [facility] presents for chronic visit  Patient presents for follow up of hypertension.  Patient tolerating losartan well with side effects.  Patient was diagnosed with hypertension 2010 so has been treated for hypertension for 10 years.Patient is working on maintaining diet and exercise regimen and follows up as directed. Complication include none.   Patient has gastroesophageal reflux symptoms with esophagitis and LTRD.  The symptoms are moderate intensity.  Length of symptoms 10 years.  Medicines include omeprazole.  Complications include none.   Patient has HYPOTHYROIDISM.  Diagnosed 10 years ago.  Patient has stable thyroid readings.  Patient is having no symptoms.  Last TSH was normal.  continue dosage of thyroid medicine.   Past Medical History:  Diagnosis Date   Adenocarcinoma of right lung, stage 1 ([place]) 07/26/2016   Anxiety    Benign essential hypertension 12/27/2016   COPD (chronic obstructive pulmonary disease) ([place])    pt denies   Depression    Hypertension    Pericardial cyst 02/02/2021   Thyroid disease     Past Surgical History:  Procedure Laterality Date   ANKLE SURGERY     bilateral   APPENDECTOMY     BREAST BIOPSY Left 02/25/2020   BREAST BIOPSY Right 08/17/2018   BREAST EXCISIONAL BIOPSY Right    unsure when but marked with scar marker   CHOLECYSTECTOMY     REPLACEMENT TOTAL KNEE Left    THYROIDECTOMY      Family History  Problem Relation Age of Onset   Cancer Mother    Cancer Father    Cancer Brother     Social History   Socioeconomic History   Marital status: Widowed    Spouse name: Not on file   Number of children: Not on file   Years of education: Not on file   Highest education level: Not  on file  Occupational History   Not on file  Tobacco Use   Smoking status: Former    Packs/day: 1.50    Years: 35.00    Pack years: 52.50    Types: Cigarettes    Quit date: 07/27/1999    Years since quitting: 21.9   Smokeless tobacco: Never  Vaping Use   Vaping Use: Never used  Substance and Sexual Activity   Alcohol use: No   Drug use: No   Sexual activity: Not Currently  Other Topics Concern   Not on file  Social History Narrative   Not on file   Social Determinants of Health   Financial Resource Strain: Not on file  Food Insecurity: No Food Insecurity   Worried About Charity fundraiser in the Last Year: Never true   Ran Out of Food in the Last Year: Never true  Transportation Needs: Not on file  Physical Activity: Not on file  Stress: Not on file  Social Connections: Not on file  Intimate Partner Violence: Not on file    Outpatient Medications Prior to Visit  Medication Sig Dispense Refill   albuterol (VENTOLIN HFA) 108 (90 Base) MCG/ACT inhaler Inhale 2 puffs into the lungs every 6 (six) hours as needed for wheezing or shortness of breath. 8 g 2   ALPRAZolam (XANAX) 0.5 MG tablet TAKE 1  TABLET(0.5 MG) BY MOUTH TWICE DAILY AS NEEDED FOR ANXIETY 60 tablet 3   Budeson-Glycopyrrol-Formoterol 160-9-4.8 MCG/ACT AERO Inhale 2 puffs into the lungs in the morning and at bedtime. 10.7 g 6   buPROPion (WELLBUTRIN XL) 150 MG 24 hr tablet Take 1 tablet (150 mg total) by mouth daily. 90 tablet 2   calcium carbonate (TUMS - DOSED IN MG ELEMENTAL CALCIUM) 500 MG chewable tablet Chew 1 tablet by mouth 2 (two) times daily as needed.      ibuprofen (ADVIL,MOTRIN) 200 MG tablet Take 600 mg by mouth 2 (two) times daily as needed for mild pain.     levothyroxine (SYNTHROID) 137 MCG tablet Take 1 tablet (137 mcg total) by mouth daily. 90 tablet 2   losartan (COZAAR) 50 MG tablet TAKE 1 TABLET EVERY DAY 90 tablet 2   meloxicam (MOBIC) 15 MG tablet TAKE 1 TABLET(15 MG) BY MOUTH DAILY 90  tablet 1   omeprazole (PRILOSEC) 40 MG capsule TAKE 1 CAPSULE ONE TIME DAILY BEFORE A MEAL 90 capsule 2   pregabalin (LYRICA) 150 MG capsule TAKE 1 CAPSULE(150 MG) BY MOUTH TWICE DAILY 60 capsule 5   traMADol (ULTRAM) 50 MG tablet TAKE 1 TABLET(50 MG) BY MOUTH FOUR TIMES DAILY 120 tablet 3   amoxicillin-clavulanate (AUGMENTIN) 875-125 MG tablet Take 1 tablet by mouth 2 (two) times daily. 20 tablet 0   No facility-administered medications prior to visit.    Allergies  Allergen Reactions   Dextromethorphan Hbr Shortness Of Breath   Keflex [Cephalexin] Shortness Of Breath    Hard time breathing   Robaxin [Methocarbamol] Nausea Only    Stomach pain . Could not eat   Bactrim [Sulfamethoxazole-Trimethoprim] Itching   Hydroxyzine Other (See Comments) and Swelling    Numbness in lips   Prednisone & Diphenhydramine Other (See Comments)    Face flushed, heart racing   Ciprofloxacin    Dextromethorphan Polistirex Er    Nitrofurantoin    Diphenhydramine Hcl Palpitations   Moxifloxacin Rash   Prednisone Other (See Comments) and Palpitations    "flushing"     ROS Review of Systems  Constitutional:  Negative for activity change and appetite change.  HENT:  Negative for congestion.   Eyes:  Negative for visual disturbance.  Respiratory:  Negative for chest tightness and shortness of breath.   Cardiovascular:  Negative for chest pain.  Gastrointestinal:  Negative for abdominal distention and abdominal pain.  Genitourinary:  Negative for difficulty urinating and dyspareunia.  Musculoskeletal:  Negative for arthralgias and back pain.  Neurological: Negative.   Psychiatric/Behavioral: Negative.       Objective:    Physical Exam Vitals reviewed.  Constitutional:      Appearance: Normal appearance.  HENT:     Head: Normocephalic.     Right Ear: Tympanic membrane, ear canal and external ear normal.     Left Ear: Tympanic membrane, ear canal and external ear normal.     Nose: Nose  normal.     Mouth/Throat:     Mouth: Mucous membranes are moist.     Pharynx: Oropharynx is clear.  Eyes:     Extraocular Movements: Extraocular movements intact.     Conjunctiva/sclera: Conjunctivae normal.     Pupils: Pupils are equal, round, and reactive to light.  Cardiovascular:     Rate and Rhythm: Normal rate and regular rhythm.     Pulses: Normal pulses.     Heart sounds: Normal heart sounds. No murmur heard.   No gallop.  Pulmonary:  Effort: Pulmonary effort is normal. No respiratory distress.     Breath sounds: Normal breath sounds. No wheezing.  Abdominal:     General: Abdomen is flat. Bowel sounds are normal. There is no distension.     Palpations: Abdomen is soft.     Tenderness: There is no abdominal tenderness.  Musculoskeletal:        General: Normal range of motion.     Cervical back: Normal range of motion and neck supple.     Right lower leg: Edema present.     Left lower leg: Edema present.  Skin:    General: Skin is warm.     Capillary Refill: Capillary refill takes less than 2 seconds.  Neurological:     General: No focal deficit present.     Mental Status: She is alert and oriented to person, place, and time. Mental status is at baseline.  Psychiatric:        Mood and Affect: Mood normal.    BP 124/80   Pulse 88   Temp (!) 96.1 F (35.6 C)   Resp 16   Ht $R'5\' 5"'OT$  (1.651 m)   Wt 186 lb (84.4 kg)   LMP  (LMP Unknown)   SpO2 95%   BMI 30.95 kg/m  Wt Readings from Last 3 Encounters:  07/08/21 186 lb (84.4 kg)  06/30/21 186 lb 9.6 oz (84.6 kg)  06/15/21 186 lb (84.4 kg)     Health Maintenance Due  Topic Date Due   Hepatitis C Screening  Never done   TETANUS/TDAP  Never done   Zoster Vaccines- Shingrix (1 of 2) Never done   COLONOSCOPY (Pts 45-40yrs Insurance coverage will need to be confirmed)  Never done   COVID-19 Vaccine (4 - Booster for Moderna series) 05/10/2021    There are no preventive care reminders to display for this  patient.  Lab Results  Component Value Date   TSH 3.050 04/09/2021   Lab Results  Component Value Date   WBC 6.2 04/09/2021   HGB 15.0 04/09/2021   HCT 45.8 04/09/2021   MCV 89 04/09/2021   PLT 227 04/09/2021   Lab Results  Component Value Date   NA 140 04/09/2021   K 4.5 04/09/2021   CHLORIDE 107 11/22/2017   CO2 21 04/09/2021   GLUCOSE 98 04/09/2021   BUN 16 04/09/2021   CREATININE 0.72 04/09/2021   BILITOT 0.6 04/09/2021   ALKPHOS 165 (H) 04/09/2021   AST 15 04/09/2021   ALT 16 04/09/2021   PROT 6.6 04/09/2021   ALBUMIN 4.2 04/09/2021   CALCIUM 9.0 04/09/2021   ANIONGAP 8 01/23/2021   EGFR 90 04/09/2021   No results found for: CHOL No results found for: HDL No results found for: LDLCALC No results found for: TRIG No results found for: CHOLHDL No results found for: HGBA1C    Assessment & Plan:   Problem List Items Addressed This Visit       Cardiovascular and Mediastinum   Benign essential hypertension An individual hypertension care plan was established and reinforced today.  The patient's status was assessed using clinical findings on exam and labs or diagnostic tests. The patient's success at meeting treatment goals on disease specific evidence-based guidelines and found to be well controlled. SELF MANAGEMENT: The patient and I together assessed ways to personally work towards obtaining the recommended goals. RECOMMENDATIONS: avoid decongestants found in common cold remedies, decrease consumption of alcohol, perform routine monitoring of BP with home BP cuff, exercise, reduction of  dietary salt, take medicines as prescribed, try not to miss doses and quit smoking.  Regular exercise and maintaining a healthy weight is needed.  Stress reduction may help. A CLINICAL SUMMARY including written plan identify barriers to care unique to individual due to social or financial issues.  We attempt to mutually creat solutions for individual and family understanding.      Atherosclerosis of aorta ([place]) Patient noted to have atherosclerosis on aorta on recent CT scan abdomen     Respiratory   Adenocarcinoma of right lung, stage 1 ([facility])   Relevant Medications   amoxicillin-clavulanate (AUGMENTIN) 875-125 MG tablet Patient continues to be treated for her lung cancer AND pericardial cyst   COPD (chronic obstructive pulmonary disease) ([place]) An individualize plan was formulated for care of COPD.  Treatment is evidence based.  She will continue on inhalers, avoid smoking and smoke.  Regular exercise with help with dyspnea. Routine follow ups and medication compliance is needed.      Digestive   Gastroesophageal reflux disease without esophagitis Plan of care was formulated today.  She is doing well.  A plan of care was formulated using patient exam, tests and other sources to optimize care using evidence based information.  Recommend no smoking, no eating after supper, avoid fatty foods, elevate Head of bed, avoid tight fitting clothing.  Continue on omeprazole, she gets worse without medicine     Endocrine   Postoperative hypothyroidism   Thyroid disease - Primary   Relevant Orders   Lipid panel   TSH Patient has postoperative hypothyroidism     Other   Fibromyalgia Patient has chronic fibromyalgia and does fairly well   Other Visit Diagnoses     Essential hypertension       Relevant Orders   Comprehensive metabolic panel   CBC with Differential/Platelet An individual hypertension care plan was established and reinforced today.  The patient's status was assessed using clinical findings on exam and labs or diagnostic tests. The patient's success at meeting treatment goals on disease specific evidence-based guidelines and found to be fair controlled. SELF MANAGEMENT: The patient and I together assessed ways to personally work towards obtaining the recommended goals. RECOMMENDATIONS: avoid decongestants found in common cold remedies, decrease consumption of  alcohol, perform routine monitoring of BP with home BP cuff, exercise, reduction of dietary salt, take medicines as prescribed, try not to miss doses and quit smoking.  Regular exercise and maintaining a healthy weight is needed.  Stress reduction may help. A CLINICAL SUMMARY including written plan identify barriers to care unique to individual due to social or financial issues.  We attempt to mutually creat solutions for individual and family understanding.    Cystitis       Relevant Medications   amoxicillin-clavulanate (AUGMENTIN) 875-125 MG tablet   Other Relevant Orders   POCT URINALYSIS DIP (CLINITEK) (Completed)   Urine Culture Re-treat with augmentin bid for 14 days     30 minute visit with review of records    Follow-up: Return in about 6 months (around 01/08/2022) for fasting.    Reinaldo Meeker, MD

## 2021-07-09 LAB — CBC WITH DIFFERENTIAL/PLATELET
Basophils Absolute: 0 10*3/uL (ref 0.0–0.2)
Basos: 1 %
EOS (ABSOLUTE): 0.1 10*3/uL (ref 0.0–0.4)
Eos: 1 %
Hematocrit: 40.2 % (ref 34.0–46.6)
Hemoglobin: 13.4 g/dL (ref 11.1–15.9)
Immature Grans (Abs): 0 10*3/uL (ref 0.0–0.1)
Immature Granulocytes: 1 %
Lymphocytes Absolute: 1.4 10*3/uL (ref 0.7–3.1)
Lymphs: 21 %
MCH: 30.4 pg (ref 26.6–33.0)
MCHC: 33.3 g/dL (ref 31.5–35.7)
MCV: 91 fL (ref 79–97)
Monocytes Absolute: 0.6 10*3/uL (ref 0.1–0.9)
Monocytes: 10 %
Neutrophils Absolute: 4.3 10*3/uL (ref 1.4–7.0)
Neutrophils: 66 %
Platelets: 229 10*3/uL (ref 150–450)
RBC: 4.41 x10E6/uL (ref 3.77–5.28)
RDW: 13.5 % (ref 11.7–15.4)
WBC: 6.5 10*3/uL (ref 3.4–10.8)

## 2021-07-09 LAB — COMPREHENSIVE METABOLIC PANEL
ALT: 12 IU/L (ref 0–32)
AST: 17 IU/L (ref 0–40)
Albumin/Globulin Ratio: 2 (ref 1.2–2.2)
Albumin: 4.1 g/dL (ref 3.8–4.8)
Alkaline Phosphatase: 134 IU/L — ABNORMAL HIGH (ref 44–121)
BUN/Creatinine Ratio: 25 (ref 12–28)
BUN: 17 mg/dL (ref 8–27)
Bilirubin Total: 0.4 mg/dL (ref 0.0–1.2)
CO2: 21 mmol/L (ref 20–29)
Calcium: 9.1 mg/dL (ref 8.7–10.3)
Chloride: 107 mmol/L — ABNORMAL HIGH (ref 96–106)
Creatinine, Ser: 0.67 mg/dL (ref 0.57–1.00)
Globulin, Total: 2.1 g/dL (ref 1.5–4.5)
Glucose: 65 mg/dL (ref 65–99)
Potassium: 5.1 mmol/L (ref 3.5–5.2)
Sodium: 146 mmol/L — ABNORMAL HIGH (ref 134–144)
Total Protein: 6.2 g/dL (ref 6.0–8.5)
eGFR: 94 mL/min/{1.73_m2} (ref >59)

## 2021-07-09 LAB — LIPID PANEL
Chol/HDL Ratio: 2.4 ratio (ref 0.0–4.4)
Cholesterol, Total: 207 mg/dL — ABNORMAL HIGH (ref 100–199)
HDL: 85 mg/dL (ref >39)
LDL Chol Calc (NIH): 112 mg/dL — ABNORMAL HIGH (ref 0–99)
Triglycerides: 54 mg/dL (ref 0–149)
VLDL Cholesterol Cal: 10 mg/dL (ref 5–40)

## 2021-07-09 LAB — CARDIOVASCULAR RISK ASSESSMENT

## 2021-07-09 LAB — TSH: TSH: 2.63 u[IU]/mL (ref 0.450–4.500)

## 2021-07-09 NOTE — Progress Notes (Signed)
Kidney and liver tests normal,  LDL cholesterol 112 high, cbc normal, TSH 2.63 normal,  lp

## 2021-07-10 LAB — URINE CULTURE

## 2021-07-10 NOTE — Progress Notes (Signed)
Urine culture negative lp

## 2021-07-21 ENCOUNTER — Other Ambulatory Visit: Payer: Self-pay

## 2021-07-21 ENCOUNTER — Ambulatory Visit (INDEPENDENT_AMBULATORY_CARE_PROVIDER_SITE_OTHER): Payer: Medicare HMO | Admitting: Pulmonary Disease

## 2021-07-21 ENCOUNTER — Ambulatory Visit: Payer: Medicare HMO | Admitting: Primary Care

## 2021-07-21 DIAGNOSIS — R0602 Shortness of breath: Secondary | ICD-10-CM | POA: Diagnosis not present

## 2021-07-21 DIAGNOSIS — J432 Centrilobular emphysema: Secondary | ICD-10-CM

## 2021-07-21 LAB — PULMONARY FUNCTION TEST
DL/VA % pred: 95 %
DL/VA: 3.94 ml/min/mmHg/L
DLCO cor % pred: 93 %
DLCO cor: 18.14 ml/min/mmHg
DLCO unc % pred: 93 %
DLCO unc: 18.14 ml/min/mmHg
FEF 25-75 Post: 1.81 L/sec
FEF 25-75 Pre: 1.83 L/sec
FEF2575-%Change-Post: -1 %
FEF2575-%Pred-Post: 95 %
FEF2575-%Pred-Pre: 96 %
FEV1-%Change-Post: -1 %
FEV1-%Pred-Post: 111 %
FEV1-%Pred-Pre: 113 %
FEV1-Post: 2.54 L
FEV1-Pre: 2.57 L
FEV1FVC-%Change-Post: 2 %
FEV1FVC-%Pred-Pre: 95 %
FEV6-%Change-Post: -3 %
FEV6-%Pred-Post: 119 %
FEV6-%Pred-Pre: 122 %
FEV6-Post: 3.41 L
FEV6-Pre: 3.52 L
FEV6FVC-%Change-Post: 0 %
FEV6FVC-%Pred-Post: 104 %
FEV6FVC-%Pred-Pre: 103 %
FVC-%Change-Post: -3 %
FVC-%Pred-Post: 113 %
FVC-%Pred-Pre: 118 %
FVC-Post: 3.41 L
FVC-Pre: 3.54 L
Post FEV1/FVC ratio: 74 %
Post FEV6/FVC ratio: 100 %
Pre FEV1/FVC ratio: 72 %
Pre FEV6/FVC Ratio: 99 %
RV % pred: 84 %
RV: 1.86 L
TLC % pred: 105 %
TLC: 5.32 L

## 2021-07-21 NOTE — Progress Notes (Signed)
PFT done today. 

## 2021-07-27 ENCOUNTER — Telehealth: Payer: Medicare HMO

## 2021-07-31 DIAGNOSIS — N959 Unspecified menopausal and perimenopausal disorder: Secondary | ICD-10-CM | POA: Diagnosis not present

## 2021-07-31 DIAGNOSIS — M810 Age-related osteoporosis without current pathological fracture: Secondary | ICD-10-CM | POA: Diagnosis not present

## 2021-08-03 ENCOUNTER — Encounter: Payer: Self-pay | Admitting: Legal Medicine

## 2021-08-03 ENCOUNTER — Other Ambulatory Visit: Payer: Self-pay

## 2021-08-03 ENCOUNTER — Encounter: Payer: Self-pay | Admitting: Primary Care

## 2021-08-03 ENCOUNTER — Ambulatory Visit (INDEPENDENT_AMBULATORY_CARE_PROVIDER_SITE_OTHER): Payer: Medicare HMO | Admitting: Primary Care

## 2021-08-03 VITALS — BP 128/92 | HR 76 | Temp 98.0°F | Ht 64.0 in | Wt 183.6 lb

## 2021-08-03 DIAGNOSIS — I700 Atherosclerosis of aorta: Secondary | ICD-10-CM

## 2021-08-03 DIAGNOSIS — R400 Somnolence: Secondary | ICD-10-CM

## 2021-08-03 DIAGNOSIS — R0602 Shortness of breath: Secondary | ICD-10-CM | POA: Diagnosis not present

## 2021-08-03 DIAGNOSIS — J432 Centrilobular emphysema: Secondary | ICD-10-CM

## 2021-08-03 DIAGNOSIS — I159 Secondary hypertension, unspecified: Secondary | ICD-10-CM

## 2021-08-03 DIAGNOSIS — J441 Chronic obstructive pulmonary disease with (acute) exacerbation: Secondary | ICD-10-CM | POA: Diagnosis not present

## 2021-08-03 DIAGNOSIS — M810 Age-related osteoporosis without current pathological fracture: Secondary | ICD-10-CM

## 2021-08-03 HISTORY — DX: Age-related osteoporosis without current pathological fracture: M81.0

## 2021-08-03 HISTORY — DX: Somnolence: R40.0

## 2021-08-03 MED ORDER — ALBUTEROL SULFATE HFA 108 (90 BASE) MCG/ACT IN AERS: 2.0000 | INHALATION_SPRAY | Freq: Four times a day (QID) | RESPIRATORY_TRACT | 2 refills | Status: DC | PRN

## 2021-08-03 NOTE — Assessment & Plan Note (Addendum)
-   Blood pressure readings have been elevated recently, she is compliant with Losartan 50mg  daily She will follow-up with PCP

## 2021-08-03 NOTE — Progress Notes (Signed)
PCCM: Thanks for seeing her [facility] 08/03/2021 6:00 PM

## 2021-08-03 NOTE — Patient Instructions (Addendum)
Recommendations: - Decreased Brezti Aerosphere 1 puff twice a day (rinse mouth after use) - After school start if you continue to have daytime fatigue (ie: falling asleep during the day) recommend we check home sleep study to re-evaluate for sleep apnea. You can also call your insurance and ask about potion you would have to pay for in home sleep study.   Referral: - Cardiology re: dyspnea on exertion; aortic atherosclerosis  Follow-up: - 6 months with Dr. Valeta Harms

## 2021-08-03 NOTE — Assessment & Plan Note (Signed)
-   Patient feels this is situational. She will notify our office if daytime fatigue does not improve with lighter schedule this fall

## 2021-08-03 NOTE — Assessment & Plan Note (Signed)
-   Referring to cardiology d.t hx aortic atherosclerosis, borderline cardiomegaly and exertional chest pain

## 2021-08-03 NOTE — Progress Notes (Signed)
@Patient  ID: Victoria Pena, female    DOB: [birth year], 70 y.o.   MRN: [id number]  No chief complaint on file.   Referring provider: Lillard Anes  HPI: 70 year old female, former smoker quit in 2000 and (52-pack-year history).  Past medical history significant for COPD, adenocarcinoma right lung stage I status post lobectomy, hypertension, aortic arthrosclerosis, Graves' disease, fibromyalgia.  Patient of Dr. Valeta Harms, seen for initial consult on 05/20/2021 for shortness of breath.  Previous [facility] pulmonary encounter: 05/20/21- Dr. Valeta Harms, consult  Chief Complaint  Patient presents with   Consult    SOB with exertion    70 yo FM, PMH of lung cancer, RUL lobectomy, in [place], 2016, was found incidentally on a CXR, followed by chemo. Was with her daughter at the time. She was originally living in [place] at the time. But was spending time in [place] with Daughter. Has had knee and back surgeries. She follows with Dr. Julien Nordmann in the cancer clinic. Was recently dx with a pericardial cyst and had surgical consultation with Dr. Kipp Brood. She has emphysema and she was placed on breztri and prn albuterol.   08/03/2021 Patient presents today for 6-week follow-up to review PFTs. She is doing well. States that she is on the go all the time. She gets short winded when she gets busy or does too much. Otherwise she has not significant respiratory symptoms. She is currently on Symbicort 160 two puffs twice a day. She has not needed to use Albuterol rescue inhaler. PFTs were normal in August 2022.   BP has been running high. She takes xanax for anxiety. Currently under some stress at home, she cares for her autistic grandson. She falls asleep during the day at times but she attributes this to her busy schedule. States that she does not have time for herself. She reports having done a sleep study many years ago which sounds like she may have had very mild sleep apnea but was never put on treatment for this. She wants  to hold of on sleep study for now as she thinks her schedule will get better once grandchildren return to school.   Pulmonary function testing: 07/21/21- FVC 3/41 (113%), FEV1 2.54 (111%), ratio 74, TLC 105%, DLCOunc 18.14 (93%) Normal spirometry without BD response. Normal diffusion capacity.    Allergies  Allergen Reactions   Dextromethorphan Hbr Shortness Of Breath   Keflex [Cephalexin] Shortness Of Breath    Hard time breathing   Robaxin [Methocarbamol] Nausea Only    Stomach pain . Could not eat   Bactrim [Sulfamethoxazole-Trimethoprim] Itching   Hydroxyzine Other (See Comments) and Swelling    Numbness in lips   Prednisone & Diphenhydramine Other (See Comments)    Face flushed, heart racing   Ciprofloxacin    Dextromethorphan Polistirex Er    Nitrofurantoin    Diphenhydramine Hcl Palpitations   Moxifloxacin Rash   Prednisone Other (See Comments) and Palpitations    "flushing"     Immunization History  Administered Date(s) Administered   Fluad Quad(high Dose 65+) 09/23/2020   Influenza-Unspecified 08/25/2018   Moderna SARS-COV2 Booster Vaccination 02/10/2021   Moderna Sars-Covid-2 Vaccination 01/14/2020, 03/03/2020   Pneumococcal Polysaccharide-23 09/23/2020    Past Medical History:  Diagnosis Date   Adenocarcinoma of right lung, stage 1 ([place]) 07/26/2016   Anxiety    Benign essential hypertension 12/27/2016   COPD (chronic obstructive pulmonary disease) ([place])    pt denies   Depression    Hypertension    Pericardial cyst 02/02/2021  Thyroid disease     Tobacco History: Social History   Tobacco Use  Smoking Status Former   Packs/day: 1.50   Years: 35.00   Pack years: 52.50   Types: Cigarettes   Quit date: 07/27/1999   Years since quitting: 22.0  Smokeless Tobacco Never   Counseling given: Not Answered   Outpatient Medications Prior to Visit  Medication Sig Dispense Refill   ALPRAZolam (XANAX) 0.5 MG tablet TAKE 1 TABLET(0.5 MG) BY MOUTH TWICE DAILY AS  NEEDED FOR ANXIETY 60 tablet 3   Budeson-Glycopyrrol-Formoterol 160-9-4.8 MCG/ACT AERO Inhale 2 puffs into the lungs in the morning and at bedtime. 10.7 g 6   buPROPion (WELLBUTRIN XL) 150 MG 24 hr tablet Take 1 tablet (150 mg total) by mouth daily. 90 tablet 2   calcium carbonate (TUMS - DOSED IN MG ELEMENTAL CALCIUM) 500 MG chewable tablet Chew 1 tablet by mouth 2 (two) times daily as needed.      ibuprofen (ADVIL,MOTRIN) 200 MG tablet Take 600 mg by mouth 2 (two) times daily as needed for mild pain.     levothyroxine (SYNTHROID) 137 MCG tablet Take 1 tablet (137 mcg total) by mouth daily. 90 tablet 2   losartan (COZAAR) 50 MG tablet TAKE 1 TABLET EVERY DAY 90 tablet 2   meloxicam (MOBIC) 15 MG tablet TAKE 1 TABLET(15 MG) BY MOUTH DAILY 90 tablet 1   omeprazole (PRILOSEC) 40 MG capsule TAKE 1 CAPSULE ONE TIME DAILY BEFORE A MEAL 90 capsule 2   pregabalin (LYRICA) 150 MG capsule TAKE 1 CAPSULE(150 MG) BY MOUTH TWICE DAILY 60 capsule 5   traMADol (ULTRAM) 50 MG tablet TAKE 1 TABLET(50 MG) BY MOUTH FOUR TIMES DAILY 120 tablet 3   albuterol (VENTOLIN HFA) 108 (90 Base) MCG/ACT inhaler Inhale 2 puffs into the lungs every 6 (six) hours as needed for wheezing or shortness of breath. 8 g 2   amoxicillin-clavulanate (AUGMENTIN) 875-125 MG tablet Take 1 tablet by mouth 2 (two) times daily. 30 tablet 0   No facility-administered medications prior to visit.    Review of Systems  Review of Systems  Constitutional:  Positive for fatigue.  HENT: Negative.    Respiratory:  Negative for cough, chest tightness, shortness of breath and wheezing.        DOE  Cardiovascular:  Positive for chest pain.  Psychiatric/Behavioral:  Positive for sleep disturbance.     Physical Exam  BP (!) 128/92 (BP Location: Left Arm, Cuff Size: Normal)   Pulse 76   Temp 98 F (36.7 C) (Oral)   Ht 5\' 4"  (1.626 m)   Wt 183 lb 9.6 oz (83.3 kg)   LMP  (LMP Unknown)   SpO2 97%   BMI 31.51 kg/m  Physical  Exam Constitutional:      General: She is not in acute distress.    Appearance: Normal appearance. She is not ill-appearing.  HENT:     Head: Normocephalic and atraumatic.     Mouth/Throat:     Mouth: Mucous membranes are moist.     Pharynx: Oropharynx is clear.  Cardiovascular:     Rate and Rhythm: Normal rate and regular rhythm.     Comments: RRR; trace BLE edema Pulmonary:     Effort: Pulmonary effort is normal.     Breath sounds: Normal breath sounds. No wheezing or rhonchi.  Musculoskeletal:        General: Normal range of motion.  Skin:    General: Skin is warm and dry.  Neurological:  General: No focal deficit present.     Mental Status: She is alert and oriented to person, place, and time. Mental status is at baseline.  Psychiatric:        Mood and Affect: Mood normal.        Behavior: Behavior normal.        Thought Content: Thought content normal.        Judgment: Judgment normal.     Lab Results:  CBC    Component Value Date/Time   WBC 6.5 07/08/2021 1436   WBC 6.0 01/23/2021 1453   WBC 4.7 05/03/2018 0952   RBC 4.41 07/08/2021 1436   RBC 4.64 01/23/2021 1453   HGB 13.4 07/08/2021 1436   HGB 12.0 11/22/2017 1451   HCT 40.2 07/08/2021 1436   HCT 37.8 11/22/2017 1451   PLT 229 07/08/2021 1436   MCV 91 07/08/2021 1436   MCV 86.2 11/22/2017 1451   MCH 30.4 07/08/2021 1436   MCH 26.7 01/23/2021 1453   MCHC 33.3 07/08/2021 1436   MCHC 30.5 01/23/2021 1453   RDW 13.5 07/08/2021 1436   RDW 13.5 11/22/2017 1451   LYMPHSABS 1.4 07/08/2021 1436   LYMPHSABS 1.3 11/22/2017 1451   MONOABS 0.5 01/23/2021 1453   MONOABS 0.4 11/22/2017 1451   EOSABS 0.1 07/08/2021 1436   BASOSABS 0.0 07/08/2021 1436   BASOSABS 0.0 11/22/2017 1451    BMET    Component Value Date/Time   NA 146 (H) 07/08/2021 1436   NA 140 11/22/2017 1451   K 5.1 07/08/2021 1436   K 3.9 11/22/2017 1451   CL 107 (H) 07/08/2021 1436   CO2 21 07/08/2021 1436   CO2 26 11/22/2017 1451    GLUCOSE 65 07/08/2021 1436   GLUCOSE 98 01/23/2021 1453   GLUCOSE 91 11/22/2017 1451   BUN 17 07/08/2021 1436   BUN 14.5 11/22/2017 1451   CREATININE 0.67 07/08/2021 1436   CREATININE 0.74 01/23/2021 1453   CREATININE 0.8 11/22/2017 1451   CALCIUM 9.1 07/08/2021 1436   CALCIUM 9.1 11/22/2017 1451   GFRNONAA >60 01/23/2021 1453   GFRAA 106 12/23/2020 1123   GFRAA >60 01/25/2020 1345    BNP No results found for: BNP  ProBNP No results found for: PROBNP  Imaging: No results found.   Assessment & Plan:   COPD (chronic obstructive pulmonary disease) ([place]) - Pulmonary function in August 2022 did not show evidence of obstructive airway disease. She has no clinical symptoms, except for dyspnea with moderate exertion. Trial de-escalating Breztri inhaler to 1 puff twice a day, if symptoms worsen she will resume maintenance inhaler at full dose.   Atherosclerosis of aorta ([place]) - Referring to cardiology d.t hx aortic atherosclerosis, borderline cardiomegaly and exertional chest pain   Hypertension - Blood pressure readings have been elevated recently, she is compliant with Losartan 50mg  daily She will follow-up with PCP   Daytime sleepiness - Patient feels this is situational. She will notify our office if daytime fatigue does not improve with lighter schedule this fall    Martyn Ehrich, NP 08/03/2021

## 2021-08-03 NOTE — Assessment & Plan Note (Addendum)
-   Pulmonary function in August 2022 did not show evidence of obstructive airway disease. She has no clinical symptoms, except for dyspnea with moderate exertion. Trial de-escalating Breztri inhaler to 1 puff twice a day, if symptoms worsen she will resume maintenance inhaler at full dose.

## 2021-08-04 ENCOUNTER — Other Ambulatory Visit: Payer: Self-pay

## 2021-08-04 MED ORDER — ALENDRONATE SODIUM 70 MG PO TABS: 70.0000 mg | ORAL_TABLET | ORAL | 11 refills | Status: DC

## 2021-08-10 ENCOUNTER — Encounter: Payer: Self-pay | Admitting: Family Medicine

## 2021-08-10 ENCOUNTER — Ambulatory Visit (INDEPENDENT_AMBULATORY_CARE_PROVIDER_SITE_OTHER): Payer: Medicare HMO | Admitting: Family Medicine

## 2021-08-10 ENCOUNTER — Other Ambulatory Visit: Payer: Self-pay

## 2021-08-10 VITALS — BP 136/80 | HR 83 | Temp 97.0°F | Ht 64.0 in | Wt 184.4 lb

## 2021-08-10 DIAGNOSIS — L0390 Cellulitis, unspecified: Secondary | ICD-10-CM | POA: Diagnosis not present

## 2021-08-10 DIAGNOSIS — T63301A Toxic effect of unspecified spider venom, accidental (unintentional), initial encounter: Secondary | ICD-10-CM

## 2021-08-10 MED ORDER — AMOXICILLIN 875 MG PO TABS: 875.0000 mg | ORAL_TABLET | Freq: Two times a day (BID) | ORAL | 0 refills | Status: DC

## 2021-08-10 MED ORDER — DOXYCYCLINE HYCLATE 100 MG PO TABS: 100.0000 mg | ORAL_TABLET | Freq: Two times a day (BID) | ORAL | 0 refills | Status: DC

## 2021-08-10 NOTE — Progress Notes (Signed)
Acute Office Visit  Subjective:    Patient ID: Victoria Pena, female    DOB: 02-Aug-1951, 70 y.o.   MRN: [id number]  Chief Complaint  Patient presents with   Insect Bite    HPI Patient is in today for insect bite on right upper thigh which appeared yesterday. Bite now has a white head and is surrounded by redness. C/o itching and redness is spreading. Does not think it was a TICK, but never saw it. No fevers, chills. No pain.   Past Medical History:  Diagnosis Date   Adenocarcinoma of right lung, stage 1 ([place]) 07/26/2016   Anxiety    Benign essential hypertension 12/27/2016   COPD (chronic obstructive pulmonary disease) ([place])    pt denies   Depression    Hypertension    Pericardial cyst 02/02/2021   Thyroid disease     Past Surgical History:  Procedure Laterality Date   ANKLE SURGERY     bilateral   APPENDECTOMY     BREAST BIOPSY Left 02/25/2020   BREAST BIOPSY Right 08/17/2018   BREAST EXCISIONAL BIOPSY Right    unsure when but marked with scar marker   CHOLECYSTECTOMY     REPLACEMENT TOTAL KNEE Left    THYROIDECTOMY      Family History  Problem Relation Age of Onset   Cancer Mother    Cancer Father    Cancer Brother    Autism Grandson     Social History   Socioeconomic History   Marital status: Widowed    Spouse name: Not on file   Number of children: Not on file   Years of education: Not on file   Highest education level: Not on file  Occupational History   Not on file  Tobacco Use   Smoking status: Former    Packs/day: 1.50    Years: 35.00    Pack years: 52.50    Types: Cigarettes    Quit date: 07/27/1999    Years since quitting: 22.0   Smokeless tobacco: Never  Vaping Use   Vaping Use: Never used  Substance and Sexual Activity   Alcohol use: No   Drug use: No   Sexual activity: Not Currently  Other Topics Concern   Not on file  Social History Narrative   Not on file   Social Determinants of Health   Financial Resource Strain: Not on  file  Food Insecurity: No Food Insecurity   Worried About Charity fundraiser in the Last Year: Never true   Ran Out of Food in the Last Year: Never true  Transportation Needs: Not on file  Physical Activity: Not on file  Stress: Not on file  Social Connections: Not on file  Intimate Partner Violence: Not on file    Outpatient Medications Prior to Visit  Medication Sig Dispense Refill   albuterol (VENTOLIN HFA) 108 (90 Base) MCG/ACT inhaler Inhale 2 puffs into the lungs every 6 (six) hours as needed for wheezing or shortness of breath. 8 g 2   alendronate (FOSAMAX) 70 MG tablet Take 1 tablet (70 mg total) by mouth every 7 (seven) days. Take with a full glass of water on an empty stomach. 4 tablet 11   ALPRAZolam (XANAX) 0.5 MG tablet TAKE 1 TABLET(0.5 MG) BY MOUTH TWICE DAILY AS NEEDED FOR ANXIETY 60 tablet 3   Budeson-Glycopyrrol-Formoterol 160-9-4.8 MCG/ACT AERO Inhale 2 puffs into the lungs in the morning and at bedtime. 10.7 g 6   buPROPion (WELLBUTRIN XL) 150 MG  24 hr tablet Take 1 tablet (150 mg total) by mouth daily. 90 tablet 2   calcium carbonate (TUMS - DOSED IN MG ELEMENTAL CALCIUM) 500 MG chewable tablet Chew 1 tablet by mouth 2 (two) times daily as needed.      ibuprofen (ADVIL,MOTRIN) 200 MG tablet Take 600 mg by mouth 2 (two) times daily as needed for mild pain.     levothyroxine (SYNTHROID) 137 MCG tablet Take 1 tablet (137 mcg total) by mouth daily. 90 tablet 2   losartan (COZAAR) 50 MG tablet TAKE 1 TABLET EVERY DAY 90 tablet 2   meloxicam (MOBIC) 15 MG tablet TAKE 1 TABLET(15 MG) BY MOUTH DAILY 90 tablet 1   omeprazole (PRILOSEC) 40 MG capsule TAKE 1 CAPSULE ONE TIME DAILY BEFORE A MEAL 90 capsule 2   pregabalin (LYRICA) 150 MG capsule TAKE 1 CAPSULE(150 MG) BY MOUTH TWICE DAILY 60 capsule 5   traMADol (ULTRAM) 50 MG tablet TAKE 1 TABLET(50 MG) BY MOUTH FOUR TIMES DAILY 120 tablet 3   No facility-administered medications prior to visit.    Allergies  Allergen  Reactions   Dextromethorphan Hbr Shortness Of Breath   Keflex [Cephalexin] Shortness Of Breath    Hard time breathing   Robaxin [Methocarbamol] Nausea Only    Stomach pain . Could not eat   Bactrim [Sulfamethoxazole-Trimethoprim] Itching   Hydroxyzine Other (See Comments) and Swelling    Numbness in lips   Prednisone & Diphenhydramine Other (See Comments)    Face flushed, heart racing   Ciprofloxacin    Dextromethorphan Polistirex Er    Nitrofurantoin    Diphenhydramine Hcl Palpitations   Moxifloxacin Rash   Prednisone Other (See Comments) and Palpitations    "flushing"     Review of Systems  Constitutional:  Negative for chills, fatigue and fever.  HENT:  Negative for congestion, ear pain, rhinorrhea and sore throat.   Respiratory:  Negative for cough and shortness of breath.   Cardiovascular:  Negative for chest pain (With anxiety).  Gastrointestinal:  Negative for abdominal pain, constipation, diarrhea, nausea and vomiting.  Genitourinary:  Negative for dysuria and urgency.  Musculoskeletal:  Negative for back pain and myalgias.  Neurological:  Negative for dizziness, weakness, light-headedness and headaches.  Psychiatric/Behavioral:  Negative for dysphoric mood. The patient is nervous/anxious.       Objective:    Physical Exam Vitals reviewed.  Constitutional:      Appearance: Normal appearance.  Skin:    Findings: Lesion (rt anterior medial leg blister with enlarging erythematous oval.) present.  Neurological:     Mental Status: She is alert.    BP 136/80   Pulse 83   Temp (!) 97 F (36.1 C)   Ht '5\' 4"'  (1.626 m)   Wt 184 lb 6.4 oz (83.6 kg)   LMP  (LMP Unknown)   SpO2 97%   BMI 31.65 kg/m  Wt Readings from Last 3 Encounters:  08/10/21 184 lb 6.4 oz (83.6 kg)  08/03/21 183 lb 9.6 oz (83.3 kg)  07/08/21 186 lb (84.4 kg)    Health Maintenance Due  Topic Date Due   Hepatitis C Screening  Never done   TETANUS/TDAP  Never done   Zoster Vaccines-  Shingrix (1 of 2) Never done   COLONOSCOPY (Pts 45-70yr Insurance coverage will need to be confirmed)  Never done   COVID-19 Vaccine (4 - Booster for Moderna series) 05/10/2021   INFLUENZA VACCINE  07/20/2021    There are no preventive care reminders to display  for this patient.   Lab Results  Component Value Date   TSH 2.630 07/08/2021   Lab Results  Component Value Date   WBC 6.5 07/08/2021   HGB 13.4 07/08/2021   HCT 40.2 07/08/2021   MCV 91 07/08/2021   PLT 229 07/08/2021   Lab Results  Component Value Date   NA 146 (H) 07/08/2021   K 5.1 07/08/2021   CHLORIDE 107 11/22/2017   CO2 21 07/08/2021   GLUCOSE 65 07/08/2021   BUN 17 07/08/2021   CREATININE 0.67 07/08/2021   BILITOT 0.4 07/08/2021   ALKPHOS 134 (H) 07/08/2021   AST 17 07/08/2021   ALT 12 07/08/2021   PROT 6.2 07/08/2021   ALBUMIN 4.1 07/08/2021   CALCIUM 9.1 07/08/2021   ANIONGAP 8 01/23/2021   EGFR 94 07/08/2021   Lab Results  Component Value Date   CHOL 207 (H) 07/08/2021   Lab Results  Component Value Date   HDL 85 07/08/2021   Lab Results  Component Value Date   LDLCALC 112 (H) 07/08/2021   Lab Results  Component Value Date   TRIG 54 07/08/2021   Lab Results  Component Value Date   CHOLHDL 2.4 07/08/2021   No results found for: HGBA1C     Assessment & Plan:  1. Cellulitis of skin 2. Spider bite wound, accidental or unintentional, initial encounter   Start zyrtec 10 mg once daily. May increase to bid if needed.  Monitor for ulceration, pain, fever.  Start on doxycycline tomorrow if not improving. Lesion mildly concerning for erythema migrans vs spider bite.    Meds ordered this encounter  Medications   DISCONTD: amoxicillin (AMOXIL) 875 MG tablet    Sig: Take 1 tablet (875 mg total) by mouth 2 (two) times daily for 10 days.    Dispense:  20 tablet    Refill:  0   doxycycline (VIBRA-TABS) 100 MG tablet    Sig: Take 1 tablet (100 mg total) by mouth 2 (two) times daily.     Dispense:  14 tablet    Refill:  0    Follow-up: No follow-ups on file.  An After Visit Summary was printed and given to the patient.  Rochel Brome, MD [facility] [phone]

## 2021-08-10 NOTE — Patient Instructions (Signed)
Recommend zyrtec 10 mg once daily.

## 2021-08-18 ENCOUNTER — Encounter: Payer: Self-pay | Admitting: Cardiology

## 2021-08-18 ENCOUNTER — Other Ambulatory Visit: Payer: Self-pay | Admitting: Legal Medicine

## 2021-08-18 ENCOUNTER — Ambulatory Visit: Payer: Medicare HMO | Admitting: Cardiology

## 2021-08-18 ENCOUNTER — Telehealth: Payer: Medicare HMO

## 2021-08-18 ENCOUNTER — Other Ambulatory Visit: Payer: Self-pay

## 2021-08-18 VITALS — BP 124/74 | HR 86 | Ht 64.0 in | Wt 184.4 lb

## 2021-08-18 DIAGNOSIS — I10 Essential (primary) hypertension: Secondary | ICD-10-CM

## 2021-08-18 DIAGNOSIS — Z902 Acquired absence of lung [part of]: Secondary | ICD-10-CM | POA: Diagnosis not present

## 2021-08-18 DIAGNOSIS — F064 Anxiety disorder due to known physiological condition: Secondary | ICD-10-CM

## 2021-08-18 DIAGNOSIS — Q248 Other specified congenital malformations of heart: Secondary | ICD-10-CM

## 2021-08-18 DIAGNOSIS — I159 Secondary hypertension, unspecified: Secondary | ICD-10-CM

## 2021-08-18 NOTE — Patient Instructions (Signed)
Medication Instructions:  Your physician recommends that you continue on your current medications as directed. Please refer to the Current Medication list given to you today.  *If you need a refill on your cardiac medications before your next appointment, please call your pharmacy*   Lab Work: None If you have labs (blood work) drawn today and your tests are completely normal, you will receive your results only by: [place] (if you have MyChart) OR A paper copy in the mail If you have any lab test that is abnormal or we need to change your treatment, we will call you to review the results.   Testing/Procedures: Your physician has requested that you have an echocardiogram. Echocardiography is a painless test that uses sound waves to create images of your heart. It provides your doctor with information about the size and shape of your heart and how well your heart's chambers and valves are working. This procedure takes approximately one hour. There are no restrictions for this procedure.    Follow-Up: At [facility], you and your health needs are our priority.  As part of our continuing mission to provide you with exceptional heart care, we have created designated Provider Care Teams.  These Care Teams include your primary Cardiologist (physician) and Advanced Practice Providers (APPs -  Physician Assistants and Nurse Practitioners) who all work together to provide you with the care you need, when you need it.  We recommend signing up for the patient portal called "MyChart".  Sign up information is provided on this After Visit Summary.  MyChart is used to connect with patients for Virtual Visits (Telemedicine).  Patients are able to view lab/test results, encounter notes, upcoming appointments, etc.  Non-urgent messages can be sent to your provider as well.   To learn more about what you can do with MyChart, go to [email or web address].    Your next appointment:   3 month(s)  The  format for your next appointment:   In Person  Provider:   Jenne Campus, MD   Other Instructions

## 2021-08-18 NOTE — Progress Notes (Signed)
Cardiology Office Note:    Date:  08/18/2021   ID:  Victoria Pena, DOB [birth year], MRN [id number]  PCP:  Lillard Anes, MD  Cardiologist:  Jenne Campus, MD    Referring MD: Martyn Ehrich, NP   Chief Complaint  Patient presents with   Discuss ordering ECHO hx of cyst in her heart    History of Present Illness:    Victoria Pena is a 70 y.o. female with past medical history significant for adenocarcinoma of the right lung stage I status post robotic resection of the right upper lobe in 2017, essential hypertension, she was found to have pericardial cyst located posteriorly.  She had quite extensive evaluation performed on the cyst.  The problem is that cyst is growing.  This size in July 2022 was 5.0/2.9 cm, CT from February was showing cyst measuring 4.6/2.5 cm.  She was very appropriately sent to cardiothoracic surgeon.  The plan was to have evaluation by pulmonary transferring out what his symptomatology is related to cyst what part of it is related to her lungs.  She comes today to my office for follow-up.  Overall she is very concerned about the cyst overall she is able to perform activities daily living she is trying to be active she does have some shortness of breath but nothing extraordinary.  Past Medical History:  Diagnosis Date   Adenocarcinoma of right lung, stage 1 ([place]) 07/26/2016   Anxiety    Benign essential hypertension 12/27/2016   COPD (chronic obstructive pulmonary disease) ([place])    pt denies   Depression    Hypertension    Pericardial cyst 02/02/2021   Thyroid disease     Past Surgical History:  Procedure Laterality Date   ANKLE SURGERY     bilateral   APPENDECTOMY     BREAST BIOPSY Left 02/25/2020   BREAST BIOPSY Right 08/17/2018   BREAST EXCISIONAL BIOPSY Right    unsure when but marked with scar marker   CHOLECYSTECTOMY     REPLACEMENT TOTAL KNEE Left    THYROIDECTOMY      Current Medications: Current Meds  Medication Sig    albuterol (VENTOLIN HFA) 108 (90 Base) MCG/ACT inhaler Inhale 2 puffs into the lungs every 6 (six) hours as needed for wheezing or shortness of breath.   ALPRAZolam (XANAX) 0.5 MG tablet TAKE 1 TABLET(0.5 MG) BY MOUTH TWICE DAILY AS NEEDED FOR ANXIETY (Patient taking differently: Take 0.5 mg by mouth at bedtime as needed for anxiety.)   buPROPion (WELLBUTRIN XL) 150 MG 24 hr tablet Take 1 tablet (150 mg total) by mouth daily.   calcium carbonate (TUMS - DOSED IN MG ELEMENTAL CALCIUM) 500 MG chewable tablet Chew 1 tablet by mouth 2 (two) times daily as needed for indigestion or heartburn.   ibuprofen (ADVIL,MOTRIN) 200 MG tablet Take 600 mg by mouth 2 (two) times daily as needed for mild pain.   levothyroxine (SYNTHROID) 137 MCG tablet Take 1 tablet (137 mcg total) by mouth daily.   losartan (COZAAR) 50 MG tablet TAKE 1 TABLET EVERY DAY (Patient taking differently: Take 50 mg by mouth daily.)   meloxicam (MOBIC) 15 MG tablet TAKE 1 TABLET(15 MG) BY MOUTH DAILY (Patient taking differently: Take 15 mg by mouth daily. TAKE 1 TABLET(15 MG) BY MOUTH DAILY)   omeprazole (PRILOSEC) 40 MG capsule TAKE 1 CAPSULE ONE TIME DAILY BEFORE A MEAL (Patient taking differently: Take 40 mg by mouth daily.)   pregabalin (LYRICA) 150 MG capsule TAKE 1 CAPSULE(150  MG) BY MOUTH TWICE DAILY (Patient taking differently: Take 150 mg by mouth 2 (two) times daily.)   traMADol (ULTRAM) 50 MG tablet TAKE 1 TABLET(50 MG) BY MOUTH FOUR TIMES DAILY (Patient taking differently: Take 50 mg by mouth 4 (four) times daily.)     Allergies:   Dextromethorphan hbr, Keflex [cephalexin], Robaxin [methocarbamol], Bactrim [sulfamethoxazole-trimethoprim], Hydroxyzine, Prednisone & diphenhydramine, Ciprofloxacin, Dextromethorphan polistirex er, Nitrofurantoin, Diphenhydramine hcl, Moxifloxacin, and Prednisone   Social History   Socioeconomic History   Marital status: Widowed    Spouse name: Not on file   Number of children: Not on file    Years of education: Not on file   Highest education level: Not on file  Occupational History   Not on file  Tobacco Use   Smoking status: Former    Packs/day: 1.50    Years: 35.00    Pack years: 52.50    Types: Cigarettes    Quit date: 07/27/1999    Years since quitting: 22.0   Smokeless tobacco: Never  Vaping Use   Vaping Use: Never used  Substance and Sexual Activity   Alcohol use: No   Drug use: No   Sexual activity: Not Currently  Other Topics Concern   Not on file  Social History Narrative   Not on file   Social Determinants of Health   Financial Resource Strain: Not on file  Food Insecurity: No Food Insecurity   Worried About Charity fundraiser in the Last Year: Never true   Ran Out of Food in the Last Year: Never true  Transportation Needs: Not on file  Physical Activity: Not on file  Stress: Not on file  Social Connections: Not on file     Family History: The patient's family history includes Autism in her grandson; Cancer in her brother, father, and mother. ROS:   Please see the history of present illness.    All 14 point review of systems negative except as described per history of present illness  EKGs/Labs/Other Studies Reviewed:      Recent Labs: 07/08/2021: ALT 12; BUN 17; Creatinine, Ser 0.67; Hemoglobin 13.4; Platelets 229; Potassium 5.1; Sodium 146; TSH 2.630  Recent Lipid Panel    Component Value Date/Time   CHOL 207 (H) 07/08/2021 1436   TRIG 54 07/08/2021 1436   HDL 85 07/08/2021 1436   CHOLHDL 2.4 07/08/2021 1436   LDLCALC 112 (H) 07/08/2021 1436    Physical Exam:    VS:  BP 124/74 (BP Location: Left Arm, Patient Position: Sitting)   Pulse 86   Ht 5\' 4"  (1.626 m)   Wt 184 lb 6.4 oz (83.6 kg)   LMP  (LMP Unknown)   SpO2 96%   BMI 31.65 kg/m     Wt Readings from Last 3 Encounters:  08/18/21 184 lb 6.4 oz (83.6 kg)  08/10/21 184 lb 6.4 oz (83.6 kg)  08/03/21 183 lb 9.6 oz (83.3 kg)     GEN:  Well nourished, well developed in  no acute distress HEENT: Normal NECK: No JVD; No carotid bruits LYMPHATICS: No lymphadenopathy CARDIAC: RRR, no murmurs, no rubs, no gallops RESPIRATORY:  Clear to auscultation without rales, wheezing or rhonchi  ABDOMEN: Soft, non-tender, non-distended MUSCULOSKELETAL:  No edema; No deformity  SKIN: Warm and dry LOWER EXTREMITIES: no swelling NEUROLOGIC:  Alert and oriented x 3 PSYCHIATRIC:  Normal affect   ASSESSMENT:    1. Pericardial cyst   2. Benign essential hypertension   3. S/P lobectomy of lung  4. Secondary hypertension    PLAN:    In order of problems listed above:  Pericardial cyst which is still actively small but growing.  She was seen by her cardiothoracic surgeon with consideration of cyst resection I understand hesitation of the surgeon to perform the surgery since she does have cyst located posteriorly as well as the fact that she does have prior lung surgery for lobectomy.  However concern is the fact that the cyst is growing.  I will send a message to surgeon with a question with the plan is.  In the meantime I will schedule him to have echocardiogram to make sure that this does not compromise the function of the heart however CT did not show any compression of the cyst on the heart. Benign essential hypertension blood pressure well controlled continue present management. Dyslipidemia her LDL is 112 HDL 85.  We will continue present management.   Medication Adjustments/Labs and Tests Ordered: Current medicines are reviewed at length with the patient today.  Concerns regarding medicines are outlined above.  No orders of the defined types were placed in this encounter.  Medication changes: No orders of the defined types were placed in this encounter.   Signed, Park Liter, MD, [facility] 08/18/2021 11:06 AM    [place]

## 2021-09-01 ENCOUNTER — Other Ambulatory Visit: Payer: Medicare HMO

## 2021-09-14 ENCOUNTER — Other Ambulatory Visit: Payer: Self-pay

## 2021-09-14 ENCOUNTER — Ambulatory Visit (INDEPENDENT_AMBULATORY_CARE_PROVIDER_SITE_OTHER): Payer: Medicare HMO

## 2021-09-14 DIAGNOSIS — Q248 Other specified congenital malformations of heart: Secondary | ICD-10-CM | POA: Diagnosis not present

## 2021-09-14 DIAGNOSIS — I10 Essential (primary) hypertension: Secondary | ICD-10-CM

## 2021-09-14 DIAGNOSIS — Z902 Acquired absence of lung [part of]: Secondary | ICD-10-CM

## 2021-09-14 DIAGNOSIS — I159 Secondary hypertension, unspecified: Secondary | ICD-10-CM | POA: Diagnosis not present

## 2021-09-14 LAB — ECHOCARDIOGRAM COMPLETE
Area-P 1/2: 4.33 cm2
S' Lateral: 2.9 cm

## 2021-09-15 ENCOUNTER — Telehealth: Payer: Self-pay

## 2021-09-15 ENCOUNTER — Encounter: Payer: Self-pay | Admitting: Nurse Practitioner

## 2021-09-15 ENCOUNTER — Ambulatory Visit (INDEPENDENT_AMBULATORY_CARE_PROVIDER_SITE_OTHER): Payer: Medicare HMO | Admitting: Nurse Practitioner

## 2021-09-15 VITALS — BP 132/80 | HR 94 | Temp 97.3°F | Ht 64.0 in | Wt 184.0 lb

## 2021-09-15 DIAGNOSIS — N3001 Acute cystitis with hematuria: Secondary | ICD-10-CM | POA: Diagnosis not present

## 2021-09-15 DIAGNOSIS — R300 Dysuria: Secondary | ICD-10-CM | POA: Diagnosis not present

## 2021-09-15 LAB — POCT URINALYSIS DIPSTICK
Bilirubin, UA: NEGATIVE
Glucose, UA: NEGATIVE
Ketones, UA: NEGATIVE
Nitrite, UA: NEGATIVE
Protein, UA: POSITIVE — AB
Spec Grav, UA: >=1.03 — AB (ref 1.010–1.025)
Urobilinogen, UA: NEGATIVE E.U./dL — AB
pH, UA: 6 (ref 5.0–8.0)

## 2021-09-15 MED ORDER — AMOXICILLIN-POT CLAVULANATE 875-125 MG PO TABS: 1.0000 | ORAL_TABLET | Freq: Two times a day (BID) | ORAL | 0 refills | Status: DC

## 2021-09-15 NOTE — Progress Notes (Signed)
Acute Office Visit  Subjective:    Patient ID: Victoria Pena, female    DOB: 1951/12/20, 70 y.o.   MRN: [id number]  Chief Complaint  Patient presents with   Urinary Tract Infection    Burning/frequency   HPI  Urinary symptoms  She reports recurrent dysuria, urgency, and frequency. The current episode started a few days ago and is gradually worsening. Patient states symptoms are 5/10 in intensity, occurring intermittently. She  has been recently treated for similar symptoms in June 2022 and July 2022. She has not taken any treatments for current UTI symptoms.States she has prolapsed hemorrhoids that are interfering with perineal care. States she is reluctant to undergo surgical repair of rectocele or hemorrhoids due to other serious health conditions including pericardial cyst, recent lung cancer and back surgery.    Associated symptoms: No abdominal pain Yes back pain-chronic  No chills Yes constipation-intermittent  No cramping No diarrhea  No discharge No fever  No hematuria No nausea  No vomiting      Past Medical History:  Diagnosis Date   Adenocarcinoma of right lung, stage 1 ([place]) 07/26/2016   Anxiety    Benign essential hypertension 12/27/2016   COPD (chronic obstructive pulmonary disease) ([place])    pt denies   Depression    Hypertension    Pericardial cyst 02/02/2021   Thyroid disease     Past Surgical History:  Procedure Laterality Date   ANKLE SURGERY     bilateral   APPENDECTOMY     BREAST BIOPSY Left 02/25/2020   BREAST BIOPSY Right 08/17/2018   BREAST EXCISIONAL BIOPSY Right    unsure when but marked with scar marker   CHOLECYSTECTOMY     REPLACEMENT TOTAL KNEE Left    THYROIDECTOMY      Family History  Problem Relation Age of Onset   Cancer Mother    Cancer Father    Cancer Brother    Autism Grandson     Social History   Socioeconomic History   Marital status: Widowed    Spouse name: Not on file   Number of children: Not on file   Years  of education: Not on file   Highest education level: Not on file  Occupational History   Not on file  Tobacco Use   Smoking status: Former    Packs/day: 1.50    Years: 35.00    Pack years: 52.50    Types: Cigarettes    Quit date: 07/27/1999    Years since quitting: 22.1   Smokeless tobacco: Never  Vaping Use   Vaping Use: Never used  Substance and Sexual Activity   Alcohol use: No   Drug use: No   Sexual activity: Not Currently  Other Topics Concern   Not on file  Social History Narrative   Not on file   Social Determinants of Health   Financial Resource Strain: Not on file  Food Insecurity: No Food Insecurity   Worried About Charity fundraiser in the Last Year: Never true   Ran Out of Food in the Last Year: Never true  Transportation Needs: Not on file  Physical Activity: Not on file  Stress: Not on file  Social Connections: Not on file  Intimate Partner Violence: Not on file    Outpatient Medications Prior to Visit  Medication Sig Dispense Refill   albuterol (VENTOLIN HFA) 108 (90 Base) MCG/ACT inhaler Inhale 2 puffs into the lungs every 6 (six) hours as needed for wheezing or shortness  of breath. 8 g 2   ALPRAZolam (XANAX) 0.5 MG tablet TAKE 1 TABLET(0.5 MG) BY MOUTH TWICE DAILY AS NEEDED FOR ANXIETY 60 tablet 3   buPROPion (WELLBUTRIN XL) 150 MG 24 hr tablet Take 1 tablet (150 mg total) by mouth daily. 90 tablet 2   calcium carbonate (TUMS - DOSED IN MG ELEMENTAL CALCIUM) 500 MG chewable tablet Chew 1 tablet by mouth 2 (two) times daily as needed for indigestion or heartburn.     ibuprofen (ADVIL,MOTRIN) 200 MG tablet Take 600 mg by mouth 2 (two) times daily as needed for mild pain.     levothyroxine (SYNTHROID) 137 MCG tablet Take 1 tablet (137 mcg total) by mouth daily. 90 tablet 2   losartan (COZAAR) 50 MG tablet TAKE 1 TABLET EVERY DAY (Patient taking differently: Take 50 mg by mouth daily.) 90 tablet 2   meloxicam (MOBIC) 15 MG tablet TAKE 1 TABLET(15 MG) BY  MOUTH DAILY (Patient taking differently: Take 15 mg by mouth daily. TAKE 1 TABLET(15 MG) BY MOUTH DAILY) 90 tablet 1   omeprazole (PRILOSEC) 40 MG capsule TAKE 1 CAPSULE ONE TIME DAILY BEFORE A MEAL (Patient taking differently: Take 40 mg by mouth daily.) 90 capsule 2   pregabalin (LYRICA) 150 MG capsule TAKE 1 CAPSULE(150 MG) BY MOUTH TWICE DAILY (Patient taking differently: Take 150 mg by mouth 2 (two) times daily.) 60 capsule 5   traMADol (ULTRAM) 50 MG tablet TAKE 1 TABLET(50 MG) BY MOUTH FOUR TIMES DAILY (Patient taking differently: Take 50 mg by mouth 4 (four) times daily.) 120 tablet 3   No facility-administered medications prior to visit.    Allergies  Allergen Reactions   Dextromethorphan Hbr Shortness Of Breath   Keflex [Cephalexin] Shortness Of Breath    Hard time breathing   Robaxin [Methocarbamol] Nausea Only    Stomach pain . Could not eat   Bactrim [Sulfamethoxazole-Trimethoprim] Itching   Hydroxyzine Other (See Comments) and Swelling    Numbness in lips   Prednisone & Diphenhydramine Other (See Comments)    Face flushed, heart racing   Ciprofloxacin    Dextromethorphan Polistirex Er    Nitrofurantoin    Diphenhydramine Hcl Palpitations   Moxifloxacin Rash   Prednisone Other (See Comments) and Palpitations    "flushing"     Review of Systems  Constitutional:  Positive for fatigue. Negative for chills and fever.  Eyes:  Negative for pain.  Gastrointestinal:  Negative for abdominal pain, constipation, diarrhea, nausea and vomiting.  Genitourinary:  Positive for dysuria, frequency and urgency. Negative for flank pain and hematuria.  Musculoskeletal:  Positive for back pain (chronic).  Neurological:  Negative for dizziness, weakness and headaches.      Objective:    Physical Exam Vitals reviewed.  Constitutional:      Appearance: Normal appearance.  Pulmonary:     Effort: Pulmonary effort is normal.     Breath sounds: Normal breath sounds.  Skin:     General: Skin is warm and dry.     Capillary Refill: Capillary refill takes less than 2 seconds.  Neurological:     General: No focal deficit present.     Mental Status: She is alert and oriented to person, place, and time.  Psychiatric:        Mood and Affect: Mood normal.        Behavior: Behavior normal.    BP 132/80 (BP Location: Left Arm, Patient Position: Sitting)   Pulse 94   Temp (!) 97.3 F (36.3 C) (  Temporal)   Ht _0  (1.626 m)   Wt 184 lb (83.5 kg)   LMP  (LMP Unknown)   SpO2 98%   BMI 31.58 kg/m  Wt Readings from Last 3 Encounters:  09/15/21 184 lb (83.5 kg)  08/18/21 184 lb 6.4 oz (83.6 kg)  08/10/21 184 lb 6.4 oz (83.6 kg)    Health Maintenance Due  Topic Date Due   Hepatitis C Screening  Never done   TETANUS/TDAP  Never done   Zoster Vaccines- Shingrix (1 of 2) Never done   COLONOSCOPY (Pts 45-31yr Insurance coverage will need to be confirmed)  Never done   COVID-19 Vaccine (4 - Booster for Moderna series) 05/05/2021   INFLUENZA VACCINE  07/20/2021       Lab Results  Component Value Date   TSH 2.630 07/08/2021   Lab Results  Component Value Date   WBC 6.5 07/08/2021   HGB 13.4 07/08/2021   HCT 40.2 07/08/2021   MCV 91 07/08/2021   PLT 229 07/08/2021   Lab Results  Component Value Date   NA 146 (H) 07/08/2021   K 5.1 07/08/2021   CHLORIDE 107 11/22/2017   CO2 21 07/08/2021   GLUCOSE 65 07/08/2021   BUN 17 07/08/2021   CREATININE 0.67 07/08/2021   BILITOT 0.4 07/08/2021   ALKPHOS 134 (H) 07/08/2021   AST 17 07/08/2021   ALT 12 07/08/2021   PROT 6.2 07/08/2021   ALBUMIN 4.1 07/08/2021   CALCIUM 9.1 07/08/2021   ANIONGAP 8 01/23/2021   EGFR 94 07/08/2021   Lab Results  Component Value Date   CHOL 207 (H) 07/08/2021   Lab Results  Component Value Date   HDL 85 07/08/2021   Lab Results  Component Value Date   LDLCALC 112 (H) 07/08/2021   Lab Results  Component Value Date   TRIG 54 07/08/2021   Lab Results  Component  Value Date   CHOLHDL 2.4 07/08/2021          Assessment & Plan:   1. Acute cystitis with hematuria - Urine Culture - amoxicillin-clavulanate (AUGMENTIN) 875-125 MG tablet; Take 1 tablet by mouth 2 (two) times daily.  Dispense: 10 tablet; Refill: 0  2. Dysuria - POCT urinalysis dipstick    Take Augmentin 500 mg twice daily for 5 days Push fluids, especially water Avoid holding urine if possible Change soap to Dove bar soap Moisturize skin immediately after shower with lotion with ceramides (Cetaphil or Cera Ve' are options) Use vaginal moisturizer as directed Perform Kegel exercises Follow-up as needed  I,Lauren M Auman,acting as a scribe for [employer] NP.,have documented all relevant documentation on the behalf of SRip Harbour NP,as directed by  SRip Harbour NP while in the presence of SRip Harbour NP.     I, SRip Harbour NP, have reviewed all documentation for this visit. The documentation on 09/15/21 for the exam, diagnosis, procedures, and orders are all accurate and complete.    Signed,Jerrell Belfast DNP 09/15/21 at 10:29 PM

## 2021-09-15 NOTE — Patient Instructions (Addendum)
Take Augmentin 500 mg twice daily for 5 days Push fluids, especially water Avoid holding urine if possible Change soap to Dove bar soap Moisturize skin immediately after shower with lotion with ceramides (Cetaphil or Cera Ve' are options) Use vaginal moisturizer as directed Perform Kegel exercises Follow-up as needed  Urinary Tract Infection, Adult A urinary tract infection (UTI) is an infection of any part of the urinary tract. The urinary tract includes: The kidneys. The ureters. The bladder. The urethra. These organs make, store, and get rid of pee (urine) in the body. What are the causes? This infection is caused by germs (bacteria) in your genital area. These germs grow and cause swelling (inflammation) of your urinary tract. What increases the risk? The following factors may make you more likely to develop this condition: Using a small, thin tube (catheter) to drain pee. Not being able to control when you pee or poop (incontinence). Being female. If you are female, these things can increase the risk: Using these methods to prevent pregnancy: A medicine that kills sperm (spermicide). A device that blocks sperm (diaphragm). Having low levels of a female hormone (estrogen). Being pregnant. You are more likely to develop this condition if: You have genes that add to your risk. You are sexually active. You take antibiotic medicines. You have trouble peeing because of: A prostate that is bigger than normal, if you are female. A blockage in the part of your body that drains pee from the bladder. A kidney stone. A nerve condition that affects your bladder. Not getting enough to drink. Not peeing often enough. You have other conditions, such as: Diabetes. A weak disease-fighting system (immune system). Sickle cell disease. Gout. Injury of the spine. What are the signs or symptoms? Symptoms of this condition include: Needing to pee right away. Peeing small amounts  often. Pain or burning when peeing. Blood in the pee. Pee that smells bad or not like normal. Trouble peeing. Pee that is cloudy. Fluid coming from the vagina, if you are female. Pain in the belly or lower back. Other symptoms include: Vomiting. Not feeling hungry. Feeling mixed up (confused). This may be the first symptom in older adults. Being tired and grouchy (irritable). A fever. Watery poop (diarrhea). How is this treated? Taking antibiotic medicine. Taking other medicines. Drinking enough water. In some cases, you may need to see a specialist. Follow these instructions at home: Medicines Take over-the-counter and prescription medicines only as told by your doctor. If you were prescribed an antibiotic medicine, take it as told by your doctor. Do not stop taking it even if you start to feel better. General instructions Make sure you: Pee until your bladder is empty. Do not hold pee for a long time. Empty your bladder after sex. Wipe from front to back after peeing or pooping if you are a female. Use each tissue one time when you wipe. Drink enough fluid to keep your pee pale yellow. Keep all follow-up visits. Contact a doctor if: You do not get better after 1-2 days. Your symptoms go away and then come back. Get help right away if: You have very bad back pain. You have very bad pain in your lower belly. You have a fever. You have chills. You feeling like you will vomit or you vomit. Summary A urinary tract infection (UTI) is an infection of any part of the urinary tract. This condition is caused by germs in your genital area. There are many risk factors for a UTI. Treatment includes  antibiotic medicines. Drink enough fluid to keep your pee pale yellow. This information is not intended to replace advice given to you by your health care provider. Make sure you discuss any questions you have with your health care provider. Document Revised: 07/18/2020 Document  Reviewed: 07/18/2020 Kegel Exercises Kegel exercises can help strengthen your pelvic floor muscles. The pelvic floor is a group of muscles that support your rectum, small intestine, and bladder. In females, pelvic floor muscles also help support the womb (uterus). These muscles help you control the flow of urine and stool. Kegel exercises are painless and simple, and they do not require any equipment. Your provider may suggest Kegel exercises to: Improve bladder and bowel control. Improve sexual response. Improve weak pelvic floor muscles after surgery to remove the uterus (hysterectomy) or pregnancy (females). Improve weak pelvic floor muscles after prostate gland removal or surgery (males). Kegel exercises involve squeezing your pelvic floor muscles, which are the same muscles you squeeze when you try to stop the flow of urine or keep from passing gas. The exercises can be done while sitting, standing, or lying down, but it is best to vary your position. Exercises How to do Kegel exercises: Squeeze your pelvic floor muscles tight. You should feel a tight lift in your rectal area. If you are a female, you should also feel a tightness in your vaginal area. Keep your stomach, buttocks, and legs relaxed. Hold the muscles tight for up to 10 seconds. Breathe normally. Relax your muscles. Repeat as told by your health care provider. Repeat this exercise daily as told by your health care provider. Continue to do this exercise for at least 4-6 weeks, or for as long as told by your health care provider. You may be referred to a physical therapist who can help you learn more about how to do Kegel exercises. Depending on your condition, your health care provider may recommend: Varying how long you squeeze your muscles. Doing several sets of exercises every day. Doing exercises for several weeks. Making Kegel exercises a part of your regular exercise routine. This information is not intended to replace  advice given to you by your health care provider. Make sure you discuss any questions you have with your health care provider. Document Revised: 11/26/2020 Document Reviewed: 07/26/2018 Elsevier Patient Education  2022 [employer] [place] Patient Education  2022 [employer]. About Rectocele  Overview  A rectocele is a type of hernia which causes different degrees of bulging of the rectal tissues into the vaginal wall.  You may even notice that it presses against the vaginal wall so much that some vaginal tissues droop outside of the opening of your vagina.  Causes of Rectocele  The most common cause is childbirth.  The muscles and ligaments in the pelvis that hold up and support the female organs and vagina become stretched and weakened during labor and delivery.  The more babies you have, the more the support tissues are stretched and weakened.  Not everyone who has a baby will develop a rectocele.  Some women have stronger supporting tissue in the pelvis and may not have as much of a problem as others.  Women who have a Cesarean section usually do not get rectocele's unless they pushed a long time prior to the cesarean delivery.  Other conditions that can cause a rectocele include chronic constipation, a chronic cough, a lot of heavy lifting, and obesity.  Older women may have this problem because the loss of female hormones  causes the vaginal tissue to become weaker.  Symptoms  There may not be any symptoms.  If you do have symptoms, they may include: Pelvic pressure in the rectal area Protrusion of the lower part of the vagina through the opening of the vagina Constipation and trapping of the stool, making it difficult to have a bowel movement.  In severe cases, you may have to press on the lower part of your vagina to help push the stool out of you rectum.  This is called splinting to empty.  Diagnosing Rectocele  Your health care provider will ask about your symptoms and  perform a pelvic exam.  S/he will ask you to bear down, pushing like you are having a bowel movement so as to see how far the lower part of the vagina protrudes into the vagina and possible outside of the vagina.  Your provider will also ask you to contract the muscles of your pelvis (like you are stopping the stream in the middle of urinating) to determine the strength of your pelvic muscles.  Your provider may also do a rectal exam.  Treatment Options  If you do not have any symptoms, no treatment may be necessary.  Other treatment options include: Pelvic floor exercises: Contracting the muscles in your genital area may help strengthen your muscles and support the organs.  Be sure to get proper exercise instruction from you physical therapist. A pessary (removealbe pelvic support device) sometimes helps rectocele symptoms. Surgery: Surgical repair may be necessary. In some cases the uterus may need to be taken out ( a hysterectomy) as well.  There are many types of surgery for pelvic support problems.  Look for physicians who specialize in repair procedures.  You can take care of yourself by: Treating and preventing constipation Avoiding heavy lifting, and lifting correctly (with your legs, not with you waist or back) Treating a chronic cough or bronchitis Not smoking avoiding too much weight gain Doing pelvic floor exercises   2007, [employer] Doc.33 Anterior and Posterior Colporrhaphy Anterior and posterior colporrhaphy are surgical procedures that treat weakness in the front wall (anterior) or back wall (posterior) of the vagina. This weakness can result in a condition called pelvic organ prolapse. Pelvic organ prolapse can cause urine leakage, a condition called incontinence. This surgery is done through incisions in the vagina. You may need this surgery if pelvic organ prolapse causes symptoms that interfere with your daily life and cannot be corrected with other  treatments. The type of colporrhaphy that is done depends on the type of prolapse. Types of pelvic organ prolapse include the following: Cystocele. This is a prolapse of the bladder and the upper part of the front wall of the vagina. Rectocele. This is a prolapse of the rectum and the lower part of the back wall of the vagina. Enterocele. This is a prolapse of the small intestine. It appears as a bulge under the neck of the uterus at the top of the back wall of the vagina. Procidentia. This is a complete prolapse of the uterus and the cervix. The prolapse can be seen and felt coming out of the vagina. Tell a health care provider about: Any allergies you have. All medicines you are taking, including vitamins, herbs, eye drops, creams, and over-the-counter medicines. Any problems you or family members have had with anesthetic medicines. Any blood disorders you have. Any surgeries you have had. Any medical conditions you have. Smoking history or history of alcohol use. Whether you are pregnant  or may be pregnant. What are the risks? Generally, this is a safe procedure. However, problems may occur, including: Infection. Bleeding. Allergic reactions to medicines. Damage to nearby structures, organs, or nerves. Problems urinating, or incontinence. Painful sex. Constipation. A blood clot that can break free and travel to your lungs. What happens before the procedure? Staying hydrated Follow instructions from your health care provider about hydration, which may include: Up to 2 hours before the procedure - you may continue to drink clear liquids, such as water, clear fruit juice, black coffee, and plain tea.  Eating and drinking restrictions Follow instructions from your health care provider about eating and drinking, which may include: 8 hours before the procedure - stop eating heavy meals or foods, such as meat, fried foods, or fatty foods. 6 hours before the procedure - stop eating light  meals or foods, such as toast or cereal. 6 hours before the procedure - stop drinking milk or drinks that contain milk. 2 hours before the procedure - stop drinking clear liquids. Medicines Ask your health care provider about: Changing or stopping your regular medicines. This is especially important if you are taking diabetes medicines or blood thinners. Taking medicines, such as aspirin and ibuprofen. These medicines can thin your blood. Do not take these medicines unless your health care provider tells you to take them. Taking over-the-counter medicines, vitamins, herbs, and supplements. Surgery safety Ask your health care provider what steps will be taken to help prevent infection. These steps may include: Removing hair at the surgery site. Washing skin with a germ-killing soap. Taking antibiotic medicine. General instructions You may be told to use estrogen cream in your vagina to help prevent complications and promote healing. Do not use any products that contain nicotine or tobacco for at least 4 weeks before the procedure. These products include cigarettes, chewing tobacco, and vaping devices, such as e-cigarettes. If you need help quitting, ask your health care provider. Plan to have a responsible adult take you home from the hospital or clinic. Arrange for someone to help you with activities during recovery. Plan to have a responsible adult care for you for the time you are told after you leave the hospital or clinic. This is important. What happens during the procedure? An IV will be inserted into one of your veins. You will be given one or more of the following: A medicine to help you relax (sedative). A medicine to make you fall asleep (general anesthetic). You will lie down on the operating table with your feet in stirrups. A small, thin tube (catheter) will be inserted through your urethra into your bladder to drain urine during surgery and recovery. An instrument (vaginal  speculum) will be used to hold your vagina open. Your health care provider will perform the procedure according to the type of repair you require. To perform anterior repair: An incision will be made in the midline section of the front part of the vaginal wall. A triangular-shaped piece of vaginal tissue will be removed. The stronger, healthier tissue will be sewn together to support the bladder. These incisions may be closed with stitches (sutures). Gauze packing will be placed inside your vagina. To perform posterior repair: An incision will be made midline on the back wall of the vagina. A triangular portion of vaginal skin will be removed to expose the muscle. Excess tissue will be removed, and stronger, healthier muscle and ligament tissue will be sewn together to support the rectum or small intestine. These incisions may  be closed with sutures. Gauze packing will be placed inside your vagina. To perform anterior and posterior repair: Both procedures will be done during the same surgery. This is done for procidentia prolapse. The procedure may vary among health care providers and hospitals. What happens after the procedure? Your blood pressure, heart rate, breathing rate, and blood oxygen level will be monitored until you leave the hospital or clinic. You will be given pain medicine as needed. You will have a catheter in place to drain your bladder. This will be in place until your bladder is working properly on its own. You may have a gauze packing in your vagina for a few days to prevent bleeding. You will be encouraged to eat and drink a regular diet. You will be encouraged to get up and walk as soon as you are able. You may need to wear compression stockings. These stockings help to prevent blood clots and reduce swelling in your legs. Summary Anterior and posterior colporrhaphy are surgical procedures that treat weakness in the front wall (anterior) or back wall (posterior) of the  vagina. The type of repair that is done depends on the type of prolapse that is present. Follow instructions from your health care provider about eating and drinking before the procedure. You will be given a general anesthetic to make you fall asleep during the procedure. This information is not intended to replace advice given to you by your health care provider. Make sure you discuss any questions you have with your health care provider. Document Revised: 06/03/2020 Document Reviewed: 06/03/2020 Elsevier Patient Education  2022 [employer].

## 2021-09-15 NOTE — Telephone Encounter (Signed)
Left VM for pt to call back.

## 2021-09-15 NOTE — Telephone Encounter (Signed)
-----   Message from Park Liter, MD sent at 09/15/2021  9:52 AM EDT ----- Overall echocardiogram showed normal left ventricle ejection fraction, some diastolic dysfunction there is some pericardial effusion which is hemodynamically insignificant.  There is also some area of fluid retention she will need to have heart MRI to clarify this.

## 2021-09-16 DIAGNOSIS — Q248 Other specified congenital malformations of heart: Secondary | ICD-10-CM | POA: Diagnosis not present

## 2021-09-16 NOTE — Telephone Encounter (Signed)
error 

## 2021-09-17 LAB — CBC WITH DIFFERENTIAL/PLATELET
Basophils Absolute: 0 10*3/uL (ref 0.0–0.2)
Basos: 1 %
EOS (ABSOLUTE): 0.1 10*3/uL (ref 0.0–0.4)
Eos: 2 %
Hematocrit: 42.3 % (ref 34.0–46.6)
Hemoglobin: 13.7 g/dL (ref 11.1–15.9)
Immature Grans (Abs): 0 10*3/uL (ref 0.0–0.1)
Immature Granulocytes: 0 %
Lymphocytes Absolute: 1.4 10*3/uL (ref 0.7–3.1)
Lymphs: 32 %
MCH: 29.6 pg (ref 26.6–33.0)
MCHC: 32.4 g/dL (ref 31.5–35.7)
MCV: 91 fL (ref 79–97)
Monocytes Absolute: 0.5 10*3/uL (ref 0.1–0.9)
Monocytes: 10 %
Neutrophils Absolute: 2.5 10*3/uL (ref 1.4–7.0)
Neutrophils: 55 %
Platelets: 235 10*3/uL (ref 150–450)
RBC: 4.63 x10E6/uL (ref 3.77–5.28)
RDW: 12.6 % (ref 11.7–15.4)
WBC: 4.5 10*3/uL (ref 3.4–10.8)

## 2021-09-17 LAB — BASIC METABOLIC PANEL
BUN/Creatinine Ratio: 29 — ABNORMAL HIGH (ref 12–28)
BUN: 18 mg/dL (ref 8–27)
CO2: 22 mmol/L (ref 20–29)
Calcium: 9.1 mg/dL (ref 8.7–10.3)
Chloride: 104 mmol/L (ref 96–106)
Creatinine, Ser: 0.62 mg/dL (ref 0.57–1.00)
Glucose: 95 mg/dL (ref 70–99)
Potassium: 4.3 mmol/L (ref 3.5–5.2)
Sodium: 141 mmol/L (ref 134–144)
eGFR: 96 mL/min/{1.73_m2} (ref >59)

## 2021-09-19 LAB — URINE CULTURE

## 2021-09-23 ENCOUNTER — Telehealth: Payer: Self-pay

## 2021-09-23 DIAGNOSIS — H52209 Unspecified astigmatism, unspecified eye: Secondary | ICD-10-CM | POA: Diagnosis not present

## 2021-09-23 DIAGNOSIS — H5213 Myopia, bilateral: Secondary | ICD-10-CM | POA: Diagnosis not present

## 2021-09-23 DIAGNOSIS — H524 Presbyopia: Secondary | ICD-10-CM | POA: Diagnosis not present

## 2021-09-23 NOTE — Telephone Encounter (Signed)
Patient was seen 09/15/21 for UTI, culture was positive for E Coli.  She completed Augmentin and is still having symptoms.  She is requesting another round of ABTs.  She states that she had to have two rounds last time.   [place], [place]

## 2021-09-25 ENCOUNTER — Telehealth: Payer: Self-pay

## 2021-09-25 NOTE — Chronic Care Management (AMB) (Signed)
Chronic Care Management Pharmacy Assistant   Name: Victoria Pena  MRN: [id number] DOB: 1951-05-29   Reason for Encounter: Disease State/ General    Recent office visits:  06-15-2021 Lillard Anes, MD. START doxycycline 100 mg twice daily for 10 days. Abnormal UA and culture.  06-30-2021 Lillard Anes, MD. Completed doxycycline. START Hydrocodone 10-325 mg every 8 hours as needed. Orders placed for DG chest and DG ribs on left.  07-08-2021 Lillard Anes, MD. Sodium= 146, chloride= 107, Alkaline= 134. Cholesterol= 207, LDL= 112.  08-10-2021 Cox, Kirsten, MD. START doxycycline 100 mg twice daily for 7 days.  09-15-2021 Rip Harbour, NP. Abnormal UA and culture. START augmentin 875-125 mg twice daily for 5 days.  Recent consult visits:  05-20-2021 Garner Nash, DO (Pulmonary). Orders placed for CT chest and pulmonary function. STOP Iron.  05-26-2021 Myra Gianotti, MD (Optometry). Unable to view encounter.  06-24-2021 Claudean Kinds, MD (Emergency medicine). Contusion of left wall of thorax. Unable to view encounter.  07-21-2021 Garner Nash, DO (Pulmonary). Pulmonary function test performed.  08-03-2021 Martyn Ehrich, NP (Pulmonary). Referral placed to cardiology. Completed Augmentin.  08-18-2021 Park Liter, MD (Cardiology). Echocardiogram completed. STOP fosamax, Doxycycline and Budeson-Glycopyrrol-Formoterol inhaler.  Hospital visits:  None in previous 6 months  Medications: Outpatient Encounter Medications as of 09/25/2021  Medication Sig   albuterol (VENTOLIN HFA) 108 (90 Base) MCG/ACT inhaler Inhale 2 puffs into the lungs every 6 (six) hours as needed for wheezing or shortness of breath.   ALPRAZolam (XANAX) 0.5 MG tablet TAKE 1 TABLET(0.5 MG) BY MOUTH TWICE DAILY AS NEEDED FOR ANXIETY   amoxicillin-clavulanate (AUGMENTIN) 875-125 MG tablet Take 1 tablet by mouth 2 (two) times daily.   buPROPion (WELLBUTRIN  XL) 150 MG 24 hr tablet Take 1 tablet (150 mg total) by mouth daily.   calcium carbonate (TUMS - DOSED IN MG ELEMENTAL CALCIUM) 500 MG chewable tablet Chew 1 tablet by mouth 2 (two) times daily as needed for indigestion or heartburn.   ibuprofen (ADVIL,MOTRIN) 200 MG tablet Take 600 mg by mouth 2 (two) times daily as needed for mild pain.   levothyroxine (SYNTHROID) 137 MCG tablet Take 1 tablet (137 mcg total) by mouth daily.   losartan (COZAAR) 50 MG tablet TAKE 1 TABLET EVERY DAY (Patient taking differently: Take 50 mg by mouth daily.)   meloxicam (MOBIC) 15 MG tablet TAKE 1 TABLET(15 MG) BY MOUTH DAILY (Patient taking differently: Take 15 mg by mouth daily. TAKE 1 TABLET(15 MG) BY MOUTH DAILY)   omeprazole (PRILOSEC) 40 MG capsule TAKE 1 CAPSULE ONE TIME DAILY BEFORE A MEAL (Patient taking differently: Take 40 mg by mouth daily.)   pregabalin (LYRICA) 150 MG capsule TAKE 1 CAPSULE(150 MG) BY MOUTH TWICE DAILY (Patient taking differently: Take 150 mg by mouth 2 (two) times daily.)   traMADol (ULTRAM) 50 MG tablet TAKE 1 TABLET(50 MG) BY MOUTH FOUR TIMES DAILY (Patient taking differently: Take 50 mg by mouth 4 (four) times daily.)   No facility-administered encounter medications on file as of 09/25/2021.    Recent Office Vitals: BP Readings from Last 3 Encounters:  09/15/21 132/80  08/18/21 124/74  08/10/21 136/80   Pulse Readings from Last 3 Encounters:  09/15/21 94  08/18/21 86  08/10/21 83    Wt Readings from Last 3 Encounters:  09/15/21 184 lb (83.5 kg)  08/18/21 184 lb 6.4 oz (83.6 kg)  08/10/21 184 lb 6.4 oz (83.6 kg)     Kidney  Function Lab Results  Component Value Date/Time   CREATININE 0.62 09/16/2021 02:15 PM   CREATININE 0.67 07/08/2021 02:36 PM   CREATININE 0.74 01/23/2021 02:53 PM   CREATININE 0.77 01/25/2020 01:45 PM   CREATININE 0.8 11/22/2017 02:51 PM   CREATININE 0.8 05/24/2017 09:34 AM   GFRNONAA >60 01/23/2021 02:53 PM   GFRAA 106 12/23/2020 11:23 AM    GFRAA >60 01/25/2020 01:45 PM    BMP Latest Ref Rng & Units 09/16/2021 07/08/2021 04/09/2021  Glucose 70 - 99 mg/dL 95 65 98  BUN 8 - 27 mg/dL 18 17 16   Creatinine 0.57 - 1.00 mg/dL 0.62 0.67 0.72  BUN/Creat Ratio 12 - 28 29(H) 25 22  Sodium 134 - 144 mmol/L 141 146(H) 140  Potassium 3.5 - 5.2 mmol/L 4.3 5.1 4.5  Chloride 96 - 106 mmol/L 104 107(H) 103  CO2 20 - 29 mmol/L 22 21 21   Calcium 8.7 - 10.3 mg/dL 9.1 9.1 9.0     Care Gaps: Last annual wellness visit? None Hep C screening overdue Tdap overdue Shingrix overdue Colonoscopy overdue Covid booster overdue last completed 02-10-2021 Flu vaccine overdue last completed 09-23-2020  Star Rating Drugs:  Losartan 50 mg- Last filled 06-28-2021 [facility] Pharmacy unknown  09-25-2021: 1st attempt left VM 09-28-2021: 2nd attempt left VM 10-06-2021: 3rd attempt left VM  [place] Clinical Pharmacist Assistant [phone]

## 2021-09-28 ENCOUNTER — Telehealth: Payer: Self-pay

## 2021-09-28 NOTE — Telephone Encounter (Signed)
Victoria Pena called to report that she completed her antibiotic for her UTI but she continues to have low pelvic pressure and discomfort. She is questioning whether she should get a refill or come in for recheck.

## 2021-09-29 ENCOUNTER — Other Ambulatory Visit: Payer: Self-pay | Admitting: Nurse Practitioner

## 2021-09-29 ENCOUNTER — Ambulatory Visit (INDEPENDENT_AMBULATORY_CARE_PROVIDER_SITE_OTHER): Payer: Medicare HMO

## 2021-09-29 ENCOUNTER — Other Ambulatory Visit: Payer: Self-pay

## 2021-09-29 ENCOUNTER — Ambulatory Visit: Payer: Medicare HMO

## 2021-09-29 DIAGNOSIS — N390 Urinary tract infection, site not specified: Secondary | ICD-10-CM

## 2021-09-29 DIAGNOSIS — N3000 Acute cystitis without hematuria: Secondary | ICD-10-CM | POA: Diagnosis not present

## 2021-09-29 LAB — POCT URINALYSIS DIPSTICK
Blood, UA: NEGATIVE
Glucose, UA: NEGATIVE
Ketones, UA: NEGATIVE
Nitrite, UA: NEGATIVE
Protein, UA: POSITIVE — AB
Spec Grav, UA: >=1.03 — AB (ref 1.010–1.025)
Urobilinogen, UA: 0.2 E.U./dL
pH, UA: 5.5 (ref 5.0–8.0)

## 2021-09-29 MED ORDER — LEVOFLOXACIN 250 MG PO TABS: 250.0000 mg | ORAL_TABLET | Freq: Every day | ORAL | 0 refills | Status: DC

## 2021-10-03 LAB — URINE CULTURE

## 2021-10-04 ENCOUNTER — Other Ambulatory Visit: Payer: Self-pay | Admitting: Nurse Practitioner

## 2021-10-05 NOTE — Progress Notes (Signed)
She already has had two rounds of antibiotics. I can send her to urology for recurrent UTIs or let her see Dr Henrene Pastor next week since he's her PCP

## 2021-10-06 ENCOUNTER — Telehealth: Payer: Self-pay | Admitting: Nurse Practitioner

## 2021-10-06 ENCOUNTER — Other Ambulatory Visit: Payer: Self-pay | Admitting: Nurse Practitioner

## 2021-10-06 DIAGNOSIS — N390 Urinary tract infection, site not specified: Secondary | ICD-10-CM

## 2021-10-06 NOTE — Telephone Encounter (Signed)
Pt called office, states she took OTC AZO UA test that revealed positive for UTI. Previous urine culture positive for E Coli. Pt has multiple antibiotic allergies. She has received two courses of antibiotics the past two weeks: Levaquin and Augmentin in the past 3 weeks. She has agreed to referral to urology for further management.

## 2021-10-10 ENCOUNTER — Other Ambulatory Visit: Payer: Self-pay | Admitting: Legal Medicine

## 2021-10-10 DIAGNOSIS — K219 Gastro-esophageal reflux disease without esophagitis: Secondary | ICD-10-CM

## 2021-10-10 DIAGNOSIS — C3491 Malignant neoplasm of unspecified part of right bronchus or lung: Secondary | ICD-10-CM

## 2021-10-12 ENCOUNTER — Telehealth (HOSPITAL_COMMUNITY): Payer: Self-pay | Admitting: *Deleted

## 2021-10-12 DIAGNOSIS — R3989 Other symptoms and signs involving the genitourinary system: Secondary | ICD-10-CM | POA: Diagnosis not present

## 2021-10-12 DIAGNOSIS — N390 Urinary tract infection, site not specified: Secondary | ICD-10-CM | POA: Diagnosis not present

## 2021-10-12 DIAGNOSIS — R339 Retention of urine, unspecified: Secondary | ICD-10-CM | POA: Diagnosis not present

## 2021-10-12 DIAGNOSIS — N952 Postmenopausal atrophic vaginitis: Secondary | ICD-10-CM | POA: Diagnosis not present

## 2021-10-12 DIAGNOSIS — N3281 Overactive bladder: Secondary | ICD-10-CM | POA: Diagnosis not present

## 2021-10-12 DIAGNOSIS — Z79899 Other long term (current) drug therapy: Secondary | ICD-10-CM | POA: Diagnosis not present

## 2021-10-12 NOTE — Telephone Encounter (Signed)
Attempted to call patient regarding upcoming cardiac MRI appointment. Left message on voicemail with name and callback number  Gianne Shugars RN Navigator Cardiac Imaging [facility] [phone] Office [phone] Cell  

## 2021-10-13 ENCOUNTER — Ambulatory Visit: Payer: Medicare HMO | Admitting: Legal Medicine

## 2021-10-13 ENCOUNTER — Ambulatory Visit (HOSPITAL_COMMUNITY)
Admission: RE | Admit: 2021-10-13 | Discharge: 2021-10-13 | Disposition: A | Discharge status: Home / Self Care | Payer: Medicare HMO | Source: Ambulatory Visit | Attending: Cardiology | Admitting: Cardiology

## 2021-10-13 ENCOUNTER — Other Ambulatory Visit: Payer: Self-pay

## 2021-10-13 DIAGNOSIS — Q248 Other specified congenital malformations of heart: Secondary | ICD-10-CM | POA: Insufficient documentation

## 2021-10-13 MED ORDER — GADOBUTROL 1 MMOL/ML IV SOLN
8.0000 mL | Freq: Once | INTRAVENOUS | Status: AC | PRN
  Administered 2021-10-13: 8 mL via INTRAVENOUS

## 2021-10-19 ENCOUNTER — Other Ambulatory Visit: Payer: Self-pay

## 2021-10-19 ENCOUNTER — Encounter: Payer: Self-pay | Admitting: Legal Medicine

## 2021-10-19 ENCOUNTER — Ambulatory Visit (INDEPENDENT_AMBULATORY_CARE_PROVIDER_SITE_OTHER): Payer: Medicare HMO | Admitting: Legal Medicine

## 2021-10-19 VITALS — BP 140/80 | HR 84 | Temp 97.5°F | Resp 16 | Ht 64.0 in | Wt 187.0 lb

## 2021-10-19 DIAGNOSIS — I159 Secondary hypertension, unspecified: Secondary | ICD-10-CM | POA: Diagnosis not present

## 2021-10-19 DIAGNOSIS — R519 Headache, unspecified: Secondary | ICD-10-CM | POA: Diagnosis not present

## 2021-10-19 MED ORDER — LOSARTAN POTASSIUM 100 MG PO TABS: 50.0000 mg | ORAL_TABLET | Freq: Every day | ORAL | 3 refills | Status: DC

## 2021-10-19 NOTE — Progress Notes (Signed)
Subjective:  Patient ID: Victoria Pena, female    DOB: 06-27-1951  Age: 70 y.o. MRN: [id number]  Chief Complaint  Patient presents with   Hypertension   Headache    HPI:  chronic visit  Patient having headache- frontal area, no blurred vision. No neuologic symptoms. She has lung cancer stage 1, balance poor and fell.  Patient presents for follow up of hypertension.  Patient tolerating losartan well with side effects.  Patient was diagnosed with hypertension 2010 so has been treated for hypertension for 10 years.Patient is working on maintaining diet and exercise regimen and follows up as directed. Complication include ha..  She is getting shakey driving   Current Outpatient Medications on File Prior to Visit  Medication Sig Dispense Refill   ALPRAZolam (XANAX) 0.5 MG tablet TAKE 1 TABLET(0.5 MG) BY MOUTH TWICE DAILY AS NEEDED FOR ANXIETY 60 tablet 3   buPROPion (WELLBUTRIN XL) 150 MG 24 hr tablet Take 1 tablet (150 mg total) by mouth daily. 90 tablet 2   calcium carbonate (TUMS - DOSED IN MG ELEMENTAL CALCIUM) 500 MG chewable tablet Chew 1 tablet by mouth 2 (two) times daily as needed for indigestion or heartburn.     D-Mannose 500 MG CAPS Take 500 mg by mouth 2 (two) times daily.     ibuprofen (ADVIL,MOTRIN) 200 MG tablet Take 600 mg by mouth 2 (two) times daily as needed for mild pain.     levothyroxine (SYNTHROID) 137 MCG tablet TAKE 1 TABLET (137 MCG TOTAL) BY MOUTH DAILY. 90 tablet 2   meloxicam (MOBIC) 15 MG tablet TAKE 1 TABLET(15 MG) BY MOUTH DAILY (Patient taking differently: Take 15 mg by mouth daily. TAKE 1 TABLET(15 MG) BY MOUTH DAILY) 90 tablet 1   omeprazole (PRILOSEC) 40 MG capsule TAKE 1 CAPSULE ONE TIME DAILY BEFORE A MEAL 90 capsule 2   pregabalin (LYRICA) 150 MG capsule TAKE 1 CAPSULE(150 MG) BY MOUTH TWICE DAILY (Patient taking differently: Take 150 mg by mouth 2 (two) times daily.) 60 capsule 5   traMADol (ULTRAM) 50 MG tablet TAKE 1 TABLET(50 MG) BY MOUTH FOUR  TIMES DAILY (Patient taking differently: Take 50 mg by mouth 4 (four) times daily.) 120 tablet 3   No current facility-administered medications on file prior to visit.   Past Medical History:  Diagnosis Date   Adenocarcinoma of right lung, stage 1 ([employer]) 07/26/2016   Anxiety    Benign essential hypertension 12/27/2016   COPD (chronic obstructive pulmonary disease) ([place])    pt denies   Depression    Hypertension    Pericardial cyst 02/02/2021   Thyroid disease    Past Surgical History:  Procedure Laterality Date   ANKLE SURGERY     bilateral   APPENDECTOMY     BREAST BIOPSY Left 02/25/2020   BREAST BIOPSY Right 08/17/2018   BREAST EXCISIONAL BIOPSY Right    unsure when but marked with scar marker   CHOLECYSTECTOMY     REPLACEMENT TOTAL KNEE Left    THYROIDECTOMY      Family History  Problem Relation Age of Onset   Cancer Mother    Cancer Father    Cancer Brother    Autism Grandson    Social History   Socioeconomic History   Marital status: Widowed    Spouse name: Not on file   Number of children: Not on file   Years of education: Not on file   Highest education level: Not on file  Occupational History   Not  on file  Tobacco Use   Smoking status: Former    Packs/day: 1.50    Years: 35.00    Pack years: 52.50    Types: Cigarettes    Quit date: 07/27/1999    Years since quitting: 22.2   Smokeless tobacco: Never  Vaping Use   Vaping Use: Never used  Substance and Sexual Activity   Alcohol use: No   Drug use: No   Sexual activity: Not Currently  Other Topics Concern   Not on file  Social History Narrative   Not on file   Social Determinants of Health   Financial Resource Strain: Not on file  Food Insecurity: No Food Insecurity   Worried About Charity fundraiser in the Last Year: Never true   Ran Out of Food in the Last Year: Never true  Transportation Needs: Not on file  Physical Activity: Not on file  Stress: Not on file  Social Connections: Not on file     Review of Systems  Constitutional:  Negative for chills, fatigue and fever.  HENT:  Negative for congestion, ear pain and sore throat.   Respiratory:  Negative for cough and shortness of breath.   Cardiovascular:  Negative for chest pain and palpitations.  Gastrointestinal:  Negative for abdominal pain, constipation, diarrhea, nausea and vomiting.  Endocrine: Negative for polydipsia, polyphagia and polyuria.  Genitourinary:  Negative for difficulty urinating and dysuria.  Musculoskeletal:  Negative for arthralgias, back pain and myalgias.  Skin:  Negative for rash.  Neurological:  Positive for headaches.  Psychiatric/Behavioral:  Negative for dysphoric mood. The patient is not nervous/anxious.     Objective:  BP 140/80   Pulse 84   Temp (!) 97.5 F (36.4 C)   Resp 16   Ht 5\' 4"  (1.626 m)   Wt 187 lb (84.8 kg)   LMP  (LMP Unknown)   SpO2 98%   BMI 32.10 kg/m   BP/Weight 10/19/2021 09/15/2021 03/21/271  Systolic BP 536 644 034  Diastolic BP 80 80 74  Wt. (Lbs) 187 184 184.4  BMI 32.1 31.58 31.65    Physical Exam Vitals reviewed.  Constitutional:      General: She is not in acute distress.    Appearance: She is well-developed.  HENT:     Head: Normocephalic.     Right Ear: Tympanic membrane normal.     Left Ear: Tympanic membrane normal.     Nose: Nose normal.     Mouth/Throat:     Mouth: Mucous membranes are moist.  Eyes:     Extraocular Movements: Extraocular movements intact.     Conjunctiva/sclera: Conjunctivae normal.     Pupils: Pupils are equal, round, and reactive to light.  Cardiovascular:     Rate and Rhythm: Normal rate and regular rhythm.     Pulses: Normal pulses.     Heart sounds: Normal heart sounds. No murmur heard.   No gallop.  Pulmonary:     Effort: Pulmonary effort is normal. No respiratory distress.     Breath sounds: Normal breath sounds. No wheezing.  Abdominal:     General: Abdomen is flat. Bowel sounds are normal. There is no  distension.     Palpations: Abdomen is soft.     Tenderness: There is no abdominal tenderness.  Musculoskeletal:     Cervical back: Normal range of motion and neck supple.  Skin:    General: Skin is warm.     Capillary Refill: Capillary refill takes less than 2  seconds.  Neurological:     Mental Status: She is alert and oriented to person, place, and time. Mental status is at baseline.     Gait: Gait abnormal.     Comments: Positive rhomberg, paralysis right lateral rectus  Psychiatric:        Mood and Affect: Mood normal.        Lab Results  Component Value Date   WBC 4.5 09/16/2021   HGB 13.7 09/16/2021   HCT 42.3 09/16/2021   PLT 235 09/16/2021   GLUCOSE 95 09/16/2021   CHOL 207 (H) 07/08/2021   TRIG 54 07/08/2021   HDL 85 07/08/2021   LDLCALC 112 (H) 07/08/2021   ALT 12 07/08/2021   AST 17 07/08/2021   NA 141 09/16/2021   K 4.3 09/16/2021   CL 104 09/16/2021   CREATININE 0.62 09/16/2021   BUN 18 09/16/2021   CO2 22 09/16/2021   TSH 2.630 07/08/2021   INR 0.95 05/03/2018      Assessment & Plan:   Problem List Items Addressed This Visit       Cardiovascular and Mediastinum   Hypertension   Relevant Medications   losartan (COZAAR) 100 MG tablet An individual hypertension care plan was established and reinforced today.  The patient's status was assessed using clinical findings on exam and labs or diagnostic tests. The patient's success at meeting treatment goals on disease specific evidence-based guidelines and found to be fair controlled. Increase losartan to 100mg  SELF MANAGEMENT: The patient and I together assessed ways to personally work towards obtaining the recommended goals. RECOMMENDATIONS: avoid decongestants found in common cold remedies, decrease consumption of alcohol, perform routine monitoring of BP with home BP cuff, exercise, reduction of dietary salt, take medicines as prescribed, try not to miss doses and quit smoking.  Regular exercise and  maintaining a healthy weight is needed.  Stress reduction may help. A CLINICAL SUMMARY including written plan identify barriers to care unique to individual due to social or financial issues.  We attempt to mutually creat solutions for individual and family understanding.    Other Visit Diagnoses     Severe headache    -  Primary   Relevant Orders   CT HEAD WO CONTRAST (5MM) Since patient has lung cancer, needs CT head to rule out metastesis     .  Meds ordered this encounter  Medications   losartan (COZAAR) 100 MG tablet    Sig: Take 0.5 tablets (50 mg total) by mouth daily.    Dispense:  90 tablet    Refill:  3     Orders Placed This Encounter  Procedures   CT HEAD WO CONTRAST (5MM)      Follow-up: Return in about 2 weeks (around 11/02/2021) for headache.  An After Visit Summary was printed and given to the patient.  Reinaldo Meeker, MD [facility] [phone]

## 2021-10-21 ENCOUNTER — Telehealth: Payer: Self-pay | Admitting: Cardiology

## 2021-10-21 ENCOUNTER — Telehealth: Payer: Self-pay | Admitting: Legal Medicine

## 2021-10-21 NOTE — Telephone Encounter (Signed)
Patient is returning call to discuss MRI results.

## 2021-10-21 NOTE — Telephone Encounter (Signed)
Patient informed of results.  

## 2021-10-21 NOTE — Telephone Encounter (Signed)
Pt returned call. She is aware of appointment date/time/location.   Royce Macadamia, [place] 10/21/21 9:55 AM

## 2021-10-21 NOTE — Telephone Encounter (Signed)
   Victoria Pena has been scheduled for the following appointment:  WHAT: HEAD CT WHERE: [facility] DATE: 11/02/21 TIME: 2:00 PM ARRIVAL TIME  A message has been left for the patient.

## 2021-10-27 ENCOUNTER — Other Ambulatory Visit: Payer: Self-pay

## 2021-10-27 ENCOUNTER — Ambulatory Visit (INDEPENDENT_AMBULATORY_CARE_PROVIDER_SITE_OTHER): Payer: Medicare HMO

## 2021-10-27 DIAGNOSIS — Z23 Encounter for immunization: Secondary | ICD-10-CM | POA: Diagnosis not present

## 2021-11-02 ENCOUNTER — Ambulatory Visit: Payer: Medicare HMO | Admitting: Legal Medicine

## 2021-11-03 ENCOUNTER — Ambulatory Visit: Payer: Medicare HMO | Admitting: Legal Medicine

## 2021-11-04 ENCOUNTER — Other Ambulatory Visit: Payer: Self-pay | Admitting: Family Medicine

## 2021-11-04 ENCOUNTER — Other Ambulatory Visit: Payer: Self-pay

## 2021-11-04 ENCOUNTER — Telehealth: Payer: Self-pay

## 2021-11-04 DIAGNOSIS — R519 Headache, unspecified: Secondary | ICD-10-CM | POA: Diagnosis not present

## 2021-11-04 MED ORDER — AMLODIPINE BESYLATE 5 MG PO TABS: 5.0000 mg | ORAL_TABLET | Freq: Every day | ORAL | 1 refills | Status: DC

## 2021-11-04 NOTE — Telephone Encounter (Signed)
Patient was notified about CT Head appointment at [facility] on 11/04/2021 at 10:15 am.

## 2021-11-04 NOTE — Telephone Encounter (Signed)
Called pt. Pt complained yesterday of headache on L side of head (pressure, uncomfortable). Did not feel like she could sleep this morning therefore checked BP. Currently complains of L side pressure in head, anxious. Denies weakness, CT, arm pain, change of vision, dizziness. "Im just standing here talking to you and I can just feel like I'm going to have a headache but its not painful just pressure." She is going to take xanax in meantime.   Please advise.   Royce Macadamia, [place] 11/04/21 8:25 AM

## 2021-11-04 NOTE — Telephone Encounter (Signed)
Patient calling left VM. States she felt off yesterday and took BP. BP readings yesterday were 151/94, 151/92, @1120p  163/94. It had been nomrla until yesterday. This AM BP is running 146/95 and 147/92. She was increased from losartan 50 mg to losartan 100 mg on 10/31. Stated she would be taking xanax to help. Call back #: [phone].  Returned call to pt @0815 . Did not reach pt during this attempt will try again in 5 minutes. Left VM stating this.   Royce Macadamia, [place] 11/04/21 8:19 AM

## 2021-11-05 ENCOUNTER — Other Ambulatory Visit: Payer: Self-pay

## 2021-11-05 DIAGNOSIS — R519 Headache, unspecified: Secondary | ICD-10-CM

## 2021-11-06 ENCOUNTER — Other Ambulatory Visit: Payer: Self-pay

## 2021-11-06 ENCOUNTER — Encounter (HOSPITAL_BASED_OUTPATIENT_CLINIC_OR_DEPARTMENT_OTHER): Payer: Self-pay | Admitting: Emergency Medicine

## 2021-11-06 ENCOUNTER — Emergency Department (HOSPITAL_BASED_OUTPATIENT_CLINIC_OR_DEPARTMENT_OTHER): Payer: Medicare HMO | Admitting: Radiology

## 2021-11-06 ENCOUNTER — Emergency Department (HOSPITAL_BASED_OUTPATIENT_CLINIC_OR_DEPARTMENT_OTHER)
Admission: EM | Admit: 2021-11-06 | Discharge: 2021-11-06 | Disposition: A | Discharge status: Home / Self Care | Payer: Medicare HMO | Attending: Emergency Medicine | Admitting: Emergency Medicine

## 2021-11-06 DIAGNOSIS — R519 Headache, unspecified: Secondary | ICD-10-CM

## 2021-11-06 DIAGNOSIS — Z96652 Presence of left artificial knee joint: Secondary | ICD-10-CM | POA: Insufficient documentation

## 2021-11-06 DIAGNOSIS — Z79899 Other long term (current) drug therapy: Secondary | ICD-10-CM | POA: Diagnosis not present

## 2021-11-06 DIAGNOSIS — J449 Chronic obstructive pulmonary disease, unspecified: Secondary | ICD-10-CM | POA: Diagnosis not present

## 2021-11-06 DIAGNOSIS — R0602 Shortness of breath: Secondary | ICD-10-CM | POA: Insufficient documentation

## 2021-11-06 DIAGNOSIS — I10 Essential (primary) hypertension: Secondary | ICD-10-CM | POA: Insufficient documentation

## 2021-11-06 DIAGNOSIS — Z87891 Personal history of nicotine dependence: Secondary | ICD-10-CM | POA: Diagnosis not present

## 2021-11-06 DIAGNOSIS — Z85118 Personal history of other malignant neoplasm of bronchus and lung: Secondary | ICD-10-CM | POA: Insufficient documentation

## 2021-11-06 DIAGNOSIS — R030 Elevated blood-pressure reading, without diagnosis of hypertension: Secondary | ICD-10-CM

## 2021-11-06 DIAGNOSIS — R079 Chest pain, unspecified: Secondary | ICD-10-CM | POA: Diagnosis not present

## 2021-11-06 LAB — CBC WITH DIFFERENTIAL/PLATELET
Abs Immature Granulocytes: 0.01 10*3/uL (ref 0.00–0.07)
Basophils Absolute: 0 10*3/uL (ref 0.0–0.1)
Basophils Relative: 1 %
Eosinophils Absolute: 0.1 10*3/uL (ref 0.0–0.5)
Eosinophils Relative: 1 %
HCT: 46.9 % — ABNORMAL HIGH (ref 36.0–46.0)
Hemoglobin: 14.9 g/dL (ref 12.0–15.0)
Immature Granulocytes: 0 %
Lymphocytes Relative: 29 %
Lymphs Abs: 1.9 10*3/uL (ref 0.7–4.0)
MCH: 29 pg (ref 26.0–34.0)
MCHC: 31.8 g/dL (ref 30.0–36.0)
MCV: 91.2 fL (ref 80.0–100.0)
Monocytes Absolute: 0.5 10*3/uL (ref 0.1–1.0)
Monocytes Relative: 7 %
Neutro Abs: 4.1 10*3/uL (ref 1.7–7.7)
Neutrophils Relative %: 62 %
Platelets: 264 10*3/uL (ref 150–400)
RBC: 5.14 MIL/uL — ABNORMAL HIGH (ref 3.87–5.11)
RDW: 13.6 % (ref 11.5–15.5)
WBC: 6.5 10*3/uL (ref 4.0–10.5)
nRBC: 0 % (ref 0.0–0.2)

## 2021-11-06 LAB — COMPREHENSIVE METABOLIC PANEL
ALT: 13 U/L (ref 0–44)
AST: 16 U/L (ref 15–41)
Albumin: 4.3 g/dL (ref 3.5–5.0)
Alkaline Phosphatase: 113 U/L (ref 38–126)
Anion gap: 9 (ref 5–15)
BUN: 21 mg/dL (ref 8–23)
CO2: 25 mmol/L (ref 22–32)
Calcium: 9.9 mg/dL (ref 8.9–10.3)
Chloride: 108 mmol/L (ref 98–111)
Creatinine, Ser: 0.67 mg/dL (ref 0.44–1.00)
GFR, Estimated: >60 mL/min (ref >60)
Glucose, Bld: 96 mg/dL (ref 70–99)
Potassium: 3.8 mmol/L (ref 3.5–5.1)
Sodium: 142 mmol/L (ref 135–145)
Total Bilirubin: 0.6 mg/dL (ref 0.3–1.2)
Total Protein: 7.3 g/dL (ref 6.5–8.1)

## 2021-11-06 LAB — TROPONIN I (HIGH SENSITIVITY)
Troponin I (High Sensitivity): 2 ng/L (ref <18)
Troponin I (High Sensitivity): 2 ng/L (ref <18)

## 2021-11-06 MED ORDER — AMLODIPINE BESYLATE 5 MG PO TABS
5.0000 mg | ORAL_TABLET | Freq: Once | ORAL | Status: AC
  Administered 2021-11-06: 5 mg via ORAL
  Filled 2021-11-06: qty 1

## 2021-11-06 NOTE — Discharge Instructions (Signed)
Go to your primary care on Monday.  Continue your blood pressure regimen as previously prescribed.  If you develop chest pain, difficulty breathing, severe headache, numbness, weakness, speech or vision change, or other new concerning symptom, return to ER for reassessment.

## 2021-11-06 NOTE — ED Triage Notes (Addendum)
Pt POV reports recent inc in BP meds due to ongoing hypertesion.  2 weeks ago losartan increased, 2 days ago amlodipine added. Pt also reports "cyst on heart" being seen by oncologist.   Pt c/o feeling flush, increased ShOB, intermittent facial numbness that spontaneously resolves x2  weeks.Denies numbness at this time. Stroke screen neg.   C/o "head pressure"  Reports taking xanax BID, prescription has run out two days ago.

## 2021-11-06 NOTE — ED Provider Notes (Signed)
[place] EMERGENCY DEPT Provider Note   CSN: [id number] Arrival date & time: 11/06/21  1343     History Chief Complaint  Patient presents with   Headache   Shortness of Breath   Hypertension    Victoria Pena is a 70 y.o. female.  Presenting to ER with concern for headache, shortness of breath, high blood pressure.  Patient states that over the past month or so she has been dealing with elevated blood pressure, intermittent sensation of flushed feeling, redness in her face.  Also during these episodes has severe headaches.  Throbbing sensation.  Has been seen by her primary care doctor office for the symptoms.  Outpatient head CT scan 2 days ago was normal.  States that her primary care doctor had added amlodipine initially just in the morning and then was instructed to take amlodipine in the morning and at night.  Reports that today her blood pressure was elevated and she was having the headache and flushed feeling in her face.  Similar to all prior episodes.  No significant changed.  She does not have associated chest pain or difficulty in breathing. HPI     Past Medical History:  Diagnosis Date   Adenocarcinoma of right lung, stage 1 ([place]) 07/26/2016   Anxiety    Benign essential hypertension 12/27/2016   COPD (chronic obstructive pulmonary disease) ([place])    pt denies   Depression    Hypertension    Pericardial cyst 02/02/2021   Thyroid disease     Patient Active Problem List   Diagnosis Date Noted   Osteoporosis 08/03/2021   Daytime sleepiness 08/03/2021   Atherosclerosis of aorta ([place]) 07/08/2021   Thyroid disease    Hypertension    COPD (chronic obstructive pulmonary disease) ([facility])    Pericardial cyst 02/02/2021   Toe ulcer, left, limited to breakdown of skin ([place]) 12/23/2020   BMI 30.0-30.9,adult 12/23/2020   Current mild episode of major depressive disorder ([place]) 03/24/2020   Gastroesophageal reflux disease without esophagitis 03/24/2020    Fibromyalgia 03/24/2020   Arthropathy of lumbar facet joint 04/11/2019   S/P lumbar spinal fusion 05/11/2018   Anxiety 12/27/2016   Benign essential hypertension 12/27/2016   Depression 12/27/2016   Graves disease 12/27/2016   Adenocarcinoma of right lung, stage 1 ([place]) 07/26/2016   Reactive airways dysfunction syndrome, unspecified asthma severity, uncomplicated ([place]) 54/00/8676   S/P lobectomy of lung 07/26/2015   Acquired spondylolisthesis 05/12/2015   Postoperative hypothyroidism 01/17/2015    Past Surgical History:  Procedure Laterality Date   ANKLE SURGERY     bilateral   APPENDECTOMY     BREAST BIOPSY Left 02/25/2020   BREAST BIOPSY Right 08/17/2018   BREAST EXCISIONAL BIOPSY Right    unsure when but marked with scar marker   CHOLECYSTECTOMY     REPLACEMENT TOTAL KNEE Left    THYROIDECTOMY       OB History   No obstetric history on file.     Family History  Problem Relation Age of Onset   Cancer Mother    Cancer Father    Cancer Brother    Autism Grandson     Social History   Tobacco Use   Smoking status: Former    Packs/day: 1.50    Years: 35.00    Pack years: 52.50    Types: Cigarettes    Quit date: 07/27/1999    Years since quitting: 22.2   Smokeless tobacco: Never  Vaping Use   Vaping Use: Never used  Substance Use Topics   Alcohol use: No   Drug use: No    Home Medications Prior to Admission medications   Medication Sig Start Date End Date Taking? Authorizing Provider  ALPRAZolam Duanne Moron) 0.5 MG tablet TAKE 1 TABLET(0.5 MG) BY MOUTH TWICE DAILY AS NEEDED FOR ANXIETY 08/18/21   Lillard Anes, MD  amLODipine (NORVASC) 5 MG tablet Take 1 tablet (5 mg total) by mouth daily. 11/04/21   [facility], Elnita Maxwell, MD  buPROPion (WELLBUTRIN XL) 150 MG 24 hr tablet Take 1 tablet (150 mg total) by mouth daily. 12/23/20   Lillard Anes, MD  calcium carbonate (TUMS - DOSED IN MG ELEMENTAL CALCIUM) 500 MG chewable tablet Chew 1 tablet by mouth 2 (two)  times daily as needed for indigestion or heartburn.    [provider]  D-Mannose 500 MG CAPS Take 500 mg by mouth 2 (two) times daily.    [provider]  ibuprofen (ADVIL,MOTRIN) 200 MG tablet Take 600 mg by mouth 2 (two) times daily as needed for mild pain.    [provider]  levothyroxine (SYNTHROID) 137 MCG tablet TAKE 1 TABLET (137 MCG TOTAL) BY MOUTH DAILY. 10/10/21   Lillard Anes, MD  losartan (COZAAR) 100 MG tablet Take 0.5 tablets (50 mg total) by mouth daily. 10/19/21   Lillard Anes, MD  meloxicam (MOBIC) 15 MG tablet TAKE 1 TABLET(15 MG) BY MOUTH DAILY Patient taking differently: Take 15 mg by mouth daily. TAKE 1 TABLET(15 MG) BY MOUTH DAILY 07/06/21   Lillard Anes, MD  omeprazole (PRILOSEC) 40 MG capsule TAKE 1 CAPSULE ONE TIME DAILY BEFORE A MEAL 10/10/21   Lillard Anes, MD  pregabalin (LYRICA) 150 MG capsule TAKE 1 CAPSULE(150 MG) BY MOUTH TWICE DAILY Patient taking differently: Take 150 mg by mouth 2 (two) times daily. 06/21/21   Lillard Anes, MD  traMADol (ULTRAM) 50 MG tablet TAKE 1 TABLET(50 MG) BY MOUTH FOUR TIMES DAILY Patient taking differently: Take 50 mg by mouth 4 (four) times daily. 05/05/21   Lillard Anes, MD    Allergies    Dextromethorphan hbr, Keflex [cephalexin], Robaxin [methocarbamol], Bactrim [sulfamethoxazole-trimethoprim], Hydroxyzine, Prednisone & diphenhydramine, Ciprofloxacin, Dextromethorphan polistirex er, Nitrofurantoin, Diphenhydramine hcl, Moxifloxacin, and Prednisone  Review of Systems   Review of Systems  Constitutional:  Negative for chills and fever.  HENT:  Negative for ear pain and sore throat.   Eyes:  Negative for pain and visual disturbance.  Respiratory:  Negative for cough and shortness of breath.   Cardiovascular:  Negative for chest pain and palpitations.  Gastrointestinal:  Negative for abdominal pain and vomiting.  Genitourinary:  Negative for dysuria  and hematuria.  Musculoskeletal:  Negative for arthralgias and back pain.  Skin:  Negative for color change and rash.  Neurological:  Positive for headaches. Negative for seizures and syncope.  All other systems reviewed and are negative.  Physical Exam Updated Vital Signs BP 130/72   Pulse 87   Temp 98.4 F (36.9 C) (Oral)   Resp 18   Ht 5\' 4"  (1.626 m)   Wt 83.5 kg   LMP  (LMP Unknown)   SpO2 96%   BMI 31.58 kg/m   Physical Exam Vitals and nursing note reviewed.  Constitutional:      General: She is not in acute distress.    Appearance: She is well-developed.  HENT:     Head: Normocephalic and atraumatic.  Eyes:     Conjunctiva/sclera: Conjunctivae normal.  Cardiovascular:  Rate and Rhythm: Normal rate and regular rhythm.     Heart sounds: No murmur heard. Pulmonary:     Effort: Pulmonary effort is normal. No respiratory distress.     Breath sounds: Normal breath sounds.  Abdominal:     Palpations: Abdomen is soft.     Tenderness: There is no abdominal tenderness.  Musculoskeletal:        General: No swelling.     Cervical back: Neck supple.  Skin:    General: Skin is warm and dry.     Capillary Refill: Capillary refill takes less than 2 seconds.  Neurological:     Mental Status: She is alert.     Comments: AAOx3 CN 2-12 intact, speech clear visual fields intact 5/5 strength in b/l UE and LE Sensation to light touch intact in b/l UE and LE Normal FNF  Psychiatric:        Mood and Affect: Mood normal.    ED Results / Procedures / Treatments   Labs (all labs ordered are listed, but only abnormal results are displayed) Labs Reviewed  CBC WITH DIFFERENTIAL/PLATELET - Abnormal; Notable for the following components:      Result Value   RBC 5.14 (*)    HCT 46.9 (*)    All other components within normal limits  COMPREHENSIVE METABOLIC PANEL  TSH  TROPONIN I (HIGH SENSITIVITY)  TROPONIN I (HIGH SENSITIVITY)    EKG None  Radiology DG Chest 2  View  Result Date: 11/06/2021 CLINICAL DATA:  Chest pain EXAM: CHEST - 2 VIEW COMPARISON:  None. FINDINGS: The heart size and mediastinal contours are within normal limits. Both lungs are clear. The visualized skeletal structures are unremarkable. IMPRESSION: No active cardiopulmonary disease. Electronically Signed   By: Dorise Bullion III M.D.   On: 11/06/2021 20:19    Procedures Procedures   Medications Ordered in ED Medications  amLODipine (NORVASC) tablet 5 mg (5 mg Oral Given 11/06/21 1954)    ED Course  I have reviewed the triage vital signs and the nursing notes.  Pertinent labs & imaging results that were available during my care of the patient were reviewed by me and considered in my medical decision making (see chart for details).    MDM Rules/Calculators/A&P                           70 year old lady presents to ER with concern for ongoing high blood pressure and sensation of feeling flushed as well as severe recurrent headaches.  On exam, she appears well in no distress.  Has a normal neurologic exam.  BP only modestly elevated.  Her basic labs are normal.  EKG without acute ischemic change and troponin within normal limits.  Doubt ACS.  She had a CT scan of her head from 2 days ago that was normal per my review of the report.  Given no focal neurologic deficits, symptoms ongoing for the past month and negative head CT 2 days ago, do not feel she needs further brain imaging tonight.  Patient was given the prescribed dose of her amlodipine from her primary care doctor this evening and her blood pressure improved to 130/72.  Her symptoms of headache and feeling flushed resolved.  Given her well appearance and the reassuring work-up today, believe she can be discharged and managed in the outpatient setting.  She has an appointment with her primary doctor on Monday, encouraged her to keep this appointment, reviewed return precautions and  discharged home.  After the discussed  management above, the patient was determined to be safe for discharge.  The patient was in agreement with this plan and all questions regarding their care were answered.  ED return precautions were discussed and the patient will return to the ED with any significant worsening of condition.  Final Clinical Impression(s) / ED Diagnoses Final diagnoses:  Nonintractable headache, unspecified chronicity pattern, unspecified headache type  Elevated blood pressure reading    Rx / DC Orders ED Discharge Orders     None        Lucrezia Starch, MD 11/06/21 2339

## 2021-11-07 LAB — TSH: TSH: 0.803 u[IU]/mL (ref 0.350–4.500)

## 2021-11-08 ENCOUNTER — Telehealth: Payer: Self-pay | Admitting: Family Medicine

## 2021-11-08 ENCOUNTER — Other Ambulatory Visit: Payer: Self-pay | Admitting: Family Medicine

## 2021-11-08 DIAGNOSIS — I159 Secondary hypertension, unspecified: Secondary | ICD-10-CM

## 2021-11-08 MED ORDER — LOSARTAN POTASSIUM 100 MG PO TABS: 100.0000 mg | ORAL_TABLET | Freq: Every day | ORAL | 0 refills | Status: DC

## 2021-11-08 NOTE — Telephone Encounter (Signed)
Over the last week her bp has been increasing. She went to ED early in the week. Ct of brain was normal. Negative blood tests.  Started on amlodipine 2.5 mg.   Numerous calls this week due to persistently elevated bp.  I increased her Losartan to 100 mg once daily early in the week. I then increased her amlodipine to 5 mg once daily. Then increased her amlodipine to 5 mg bid on Friday.   Her bp and symptoms seemed to improve with xanax, but pt cannot figure out why. She has not had increased stress.  Pt called Sunday concerned she her bp has continue to be up, in fact, seems to worsen. Bp has remained high 160-170s/90s until today when she discontinued her amlodipine today. Her last amlodipine was 8 pm last night. The amlodipine seems to make her feel shaky. Seems to make her bp worse. Caused insomnia. Bones achy which may be her fibromyalgia.   She took an extra losartan 50 mg on Saturday which seemed to help. After stopping amlodipine on Sunday her bps were 138-150/80-90s.  I agreed she should stop amlodipine.  May take an extra losartan 50 mg today if bp increases greater than 150/100.  Keep appt in am with Dr Henrene Pastor.Marland Kitchen

## 2021-11-09 ENCOUNTER — Encounter: Payer: Self-pay | Admitting: Legal Medicine

## 2021-11-09 ENCOUNTER — Ambulatory Visit (INDEPENDENT_AMBULATORY_CARE_PROVIDER_SITE_OTHER): Payer: Medicare HMO | Admitting: Legal Medicine

## 2021-11-09 ENCOUNTER — Other Ambulatory Visit: Payer: Self-pay

## 2021-11-09 VITALS — BP 110/80 | HR 79 | Temp 97.7°F | Resp 16 | Ht 64.0 in | Wt 181.0 lb

## 2021-11-09 DIAGNOSIS — I10 Essential (primary) hypertension: Secondary | ICD-10-CM

## 2021-11-09 NOTE — Progress Notes (Signed)
Subjective:  Patient ID: Victoria Pena, female    DOB: 1951/08/17  Age: 70 y.o. MRN: [id number]  Chief Complaint  Patient presents with   Hypertension    HPI: hypertension.  Put on amlodipine but BP increased and headache.  Better BP off amlodipine. Headaches stopped.  She is checking BP every few hours. We discussed not chacking it but once or twice a week unless she feels bad.   Current Outpatient Medications on File Prior to Visit  Medication Sig Dispense Refill   ALPRAZolam (XANAX) 0.5 MG tablet TAKE 1 TABLET(0.5 MG) BY MOUTH TWICE DAILY AS NEEDED FOR ANXIETY 60 tablet 3   buPROPion (WELLBUTRIN XL) 150 MG 24 hr tablet Take 1 tablet (150 mg total) by mouth daily. 90 tablet 2   calcium carbonate (TUMS - DOSED IN MG ELEMENTAL CALCIUM) 500 MG chewable tablet Chew 1 tablet by mouth 2 (two) times daily as needed for indigestion or heartburn.     D-Mannose 500 MG CAPS Take 500 mg by mouth 2 (two) times daily.     ibuprofen (ADVIL,MOTRIN) 200 MG tablet Take 600 mg by mouth 2 (two) times daily as needed for mild pain.     levothyroxine (SYNTHROID) 137 MCG tablet TAKE 1 TABLET (137 MCG TOTAL) BY MOUTH DAILY. 90 tablet 2   losartan (COZAAR) 100 MG tablet Take 1 tablet (100 mg total) by mouth daily. 1 tablet 0   meloxicam (MOBIC) 15 MG tablet TAKE 1 TABLET(15 MG) BY MOUTH DAILY (Patient taking differently: Take 15 mg by mouth daily. TAKE 1 TABLET(15 MG) BY MOUTH DAILY) 90 tablet 1   omeprazole (PRILOSEC) 40 MG capsule TAKE 1 CAPSULE ONE TIME DAILY BEFORE A MEAL 90 capsule 2   pregabalin (LYRICA) 150 MG capsule TAKE 1 CAPSULE(150 MG) BY MOUTH TWICE DAILY (Patient taking differently: Take 150 mg by mouth 2 (two) times daily.) 60 capsule 5   traMADol (ULTRAM) 50 MG tablet TAKE 1 TABLET(50 MG) BY MOUTH FOUR TIMES DAILY (Patient taking differently: Take 50 mg by mouth 4 (four) times daily.) 120 tablet 3   No current facility-administered medications on file prior to visit.   Past Medical History:   Diagnosis Date   Adenocarcinoma of right lung, stage 1 ([place]) 07/26/2016   Anxiety    Benign essential hypertension 12/27/2016   COPD (chronic obstructive pulmonary disease) ([facility])    pt denies   Depression    Hypertension    Pericardial cyst 02/02/2021   Thyroid disease    Past Surgical History:  Procedure Laterality Date   ANKLE SURGERY     bilateral   APPENDECTOMY     BREAST BIOPSY Left 02/25/2020   BREAST BIOPSY Right 08/17/2018   BREAST EXCISIONAL BIOPSY Right    unsure when but marked with scar marker   CHOLECYSTECTOMY     REPLACEMENT TOTAL KNEE Left    THYROIDECTOMY      Family History  Problem Relation Age of Onset   Cancer Mother    Cancer Father    Cancer Brother    Autism Grandson    Social History   Socioeconomic History   Marital status: Widowed    Spouse name: Not on file   Number of children: Not on file   Years of education: Not on file   Highest education level: Not on file  Occupational History   Not on file  Tobacco Use   Smoking status: Former    Packs/day: 1.50    Years: 35.00  Pack years: 52.50    Types: Cigarettes    Quit date: 07/27/1999    Years since quitting: 22.3   Smokeless tobacco: Never  Vaping Use   Vaping Use: Never used  Substance and Sexual Activity   Alcohol use: No   Drug use: No   Sexual activity: Not Currently  Other Topics Concern   Not on file  Social History Narrative   Not on file   Social Determinants of Health   Financial Resource Strain: Not on file  Food Insecurity: Not on file  Transportation Needs: Not on file  Physical Activity: Not on file  Stress: Not on file  Social Connections: Not on file    Review of Systems  Constitutional:  Negative for chills, fatigue and fever.  HENT:  Negative for congestion, ear pain and sore throat.   Respiratory:  Negative for cough and shortness of breath.   Cardiovascular:  Negative for chest pain and palpitations.  Gastrointestinal:  Negative for abdominal pain,  constipation, diarrhea, nausea and vomiting.  Endocrine: Negative for polydipsia, polyphagia and polyuria.  Genitourinary:  Negative for difficulty urinating, dysuria and urgency.  Musculoskeletal:  Negative for arthralgias, back pain and myalgias.  Skin:  Negative for rash.  Neurological:  Positive for headaches (Goes and comes).  Psychiatric/Behavioral:  Negative for dysphoric mood. The patient is not nervous/anxious.     Objective:  BP 110/80   Pulse 79   Temp 97.7 F (36.5 C)   Resp 16   Ht 5\' 4"  (1.626 m)   Wt 181 lb (82.1 kg)   LMP  (LMP Unknown)   SpO2 97%   BMI 31.07 kg/m   BP/Weight 11/09/2021 11/06/2021 35/57/3220  Systolic BP 254 270 623  Diastolic BP 80 72 80  Wt. (Lbs) 181 184 187  BMI 31.07 31.58 32.1    Physical Exam Vitals reviewed.  Constitutional:      General: She is not in acute distress.    Appearance: Normal appearance.  HENT:     Right Ear: Tympanic membrane normal.     Left Ear: Tympanic membrane normal.     Mouth/Throat:     Mouth: Mucous membranes are moist.     Pharynx: Oropharynx is clear.  Eyes:     Extraocular Movements: Extraocular movements intact.     Conjunctiva/sclera: Conjunctivae normal.     Pupils: Pupils are equal, round, and reactive to light.  Cardiovascular:     Rate and Rhythm: Normal rate and regular rhythm.     Pulses: Normal pulses.     Heart sounds: Normal heart sounds. No murmur heard.   No gallop.  Pulmonary:     Effort: Pulmonary effort is normal. No respiratory distress.     Breath sounds: No wheezing.  Abdominal:     General: Abdomen is flat. Bowel sounds are normal. There is no distension.     Palpations: Abdomen is soft.     Tenderness: There is no abdominal tenderness.  Musculoskeletal:        General: Normal range of motion.     Cervical back: Normal range of motion.     Right lower leg: No edema.     Left lower leg: No edema.  Skin:    General: Skin is warm.     Capillary Refill: Capillary refill  takes less than 2 seconds.  Neurological:     General: No focal deficit present.     Mental Status: She is alert and oriented to person, place, and time.  Psychiatric:        Mood and Affect: Mood normal.        Lab Results  Component Value Date   WBC 6.5 11/06/2021   HGB 14.9 11/06/2021   HCT 46.9 (H) 11/06/2021   PLT 264 11/06/2021   GLUCOSE 96 11/06/2021   CHOL 207 (H) 07/08/2021   TRIG 54 07/08/2021   HDL 85 07/08/2021   LDLCALC 112 (H) 07/08/2021   ALT 13 11/06/2021   AST 16 11/06/2021   NA 142 11/06/2021   K 3.8 11/06/2021   CL 108 11/06/2021   CREATININE 0.67 11/06/2021   BUN 21 11/06/2021   CO2 25 11/06/2021   TSH 0.803 11/06/2021   INR 0.95 05/03/2018      Assessment & Plan:   Problem List Items Addressed This Visit       Cardiovascular and Mediastinum   Hypertension - Primary BP is stable off amlodipine, she is on 100mg  losartan a day.  .         Follow-up: Return as scheduled.  An After Visit Summary was printed and given to the patient.  Reinaldo Meeker, MD [facility] [phone]

## 2021-11-11 ENCOUNTER — Telehealth: Payer: Self-pay

## 2021-11-11 NOTE — Telephone Encounter (Signed)
Patient asked if can you refill metronidazole gel topical for her rosacea. Can you please advice.

## 2021-11-16 ENCOUNTER — Other Ambulatory Visit: Payer: Self-pay | Admitting: Legal Medicine

## 2021-11-16 MED ORDER — METRONIDAZOLE 0.75 % EX GEL: 1.0000 "application " | Freq: Two times a day (BID) | CUTANEOUS | 3 refills | Status: DC

## 2021-11-16 NOTE — Telephone Encounter (Signed)
What does she want?

## 2021-11-16 NOTE — Progress Notes (Unsigned)
metronidazole

## 2021-11-16 NOTE — Telephone Encounter (Signed)
Can I send the prescription for metronidazole gel?

## 2021-11-24 ENCOUNTER — Ambulatory Visit: Payer: Medicare HMO | Admitting: Cardiology

## 2021-11-25 ENCOUNTER — Encounter: Payer: Self-pay | Admitting: Sports Medicine

## 2021-11-25 ENCOUNTER — Ambulatory Visit: Payer: Medicare HMO | Admitting: Sports Medicine

## 2021-11-25 DIAGNOSIS — M2142 Flat foot [pes planus] (acquired), left foot: Secondary | ICD-10-CM | POA: Diagnosis not present

## 2021-11-25 DIAGNOSIS — M76829 Posterior tibial tendinitis, unspecified leg: Secondary | ICD-10-CM | POA: Diagnosis not present

## 2021-11-25 DIAGNOSIS — M779 Enthesopathy, unspecified: Secondary | ICD-10-CM

## 2021-11-25 DIAGNOSIS — M19072 Primary osteoarthritis, left ankle and foot: Secondary | ICD-10-CM | POA: Diagnosis not present

## 2021-11-25 DIAGNOSIS — M722 Plantar fascial fibromatosis: Secondary | ICD-10-CM | POA: Diagnosis not present

## 2021-11-25 DIAGNOSIS — M79671 Pain in right foot: Secondary | ICD-10-CM

## 2021-11-25 DIAGNOSIS — M2141 Flat foot [pes planus] (acquired), right foot: Secondary | ICD-10-CM | POA: Diagnosis not present

## 2021-11-25 DIAGNOSIS — M79672 Pain in left foot: Secondary | ICD-10-CM

## 2021-11-25 DIAGNOSIS — M797 Fibromyalgia: Secondary | ICD-10-CM

## 2021-11-25 MED ORDER — TRIAMCINOLONE ACETONIDE 10 MG/ML IJ SUSP
10.0000 mg | Freq: Once | INTRAMUSCULAR | Status: AC
Start: 2021-11-25 — End: 2021-11-25
  Administered 2021-11-25: 10 mg

## 2021-11-25 NOTE — Progress Notes (Signed)
Subjective: Victoria Pena is a 70 y.o. female patient who returns to office for follow up evaluation of left and right foot pain.  Patient reports that she is still hurting and she walks with a cane and states that it still hurts to stand along the entire bottoms of her feet all the way up the legs states that she feels like her mobility is worsening and she has tried shoes braces insoles custom orthotics rest immobilization boots even had surgery in the past and still has pain.  Patient Active Problem List   Diagnosis Date Noted   Osteoporosis 08/03/2021   Daytime sleepiness 08/03/2021   Atherosclerosis of aorta ([place] [facility]) 07/08/2021   Thyroid disease    Hypertension    COPD (chronic obstructive pulmonary disease) ([facility])    Pericardial cyst 02/02/2021   Toe ulcer, left, limited to breakdown of skin ([place]) 12/23/2020   BMI 30.0-30.9,adult 12/23/2020   Current mild episode of major depressive disorder ([place]) 03/24/2020   Gastroesophageal reflux disease without esophagitis 03/24/2020   Fibromyalgia 03/24/2020   Arthropathy of lumbar facet joint 04/11/2019   S/P lumbar spinal fusion 05/11/2018   Anxiety 12/27/2016   Benign essential hypertension 12/27/2016   Depression 12/27/2016   Graves disease 12/27/2016   Adenocarcinoma of right lung, stage 1 ([place]) 07/26/2016   Reactive airways dysfunction syndrome, unspecified asthma severity, uncomplicated ([place]) 32/20/2542   S/P lobectomy of lung 07/26/2015   Acquired spondylolisthesis 05/12/2015   Postoperative hypothyroidism 01/17/2015    Current Outpatient Medications on File Prior to Visit  Medication Sig Dispense Refill   ALPRAZolam (XANAX) 0.5 MG tablet TAKE 1 TABLET(0.5 MG) BY MOUTH TWICE DAILY AS NEEDED FOR ANXIETY 60 tablet 3   buPROPion (WELLBUTRIN XL) 150 MG 24 hr tablet Take 1 tablet (150 mg total) by mouth daily. 90 tablet 2   calcium carbonate (TUMS - DOSED IN MG ELEMENTAL CALCIUM) 500 MG chewable tablet Chew 1 tablet by mouth 2 (two)  times daily as needed for indigestion or heartburn.     D-Mannose 500 MG CAPS Take 500 mg by mouth 2 (two) times daily.     ibuprofen (ADVIL,MOTRIN) 200 MG tablet Take 600 mg by mouth 2 (two) times daily as needed for mild pain.     levothyroxine (SYNTHROID) 137 MCG tablet TAKE 1 TABLET (137 MCG TOTAL) BY MOUTH DAILY. 90 tablet 2   losartan (COZAAR) 100 MG tablet Take 1 tablet (100 mg total) by mouth daily. 1 tablet 0   meloxicam (MOBIC) 15 MG tablet TAKE 1 TABLET(15 MG) BY MOUTH DAILY (Patient taking differently: Take 15 mg by mouth daily. TAKE 1 TABLET(15 MG) BY MOUTH DAILY) 90 tablet 1   metroNIDAZOLE (METROGEL) 0.75 % gel Apply 1 application topically 2 (two) times daily. 45 g 3   omeprazole (PRILOSEC) 40 MG capsule TAKE 1 CAPSULE ONE TIME DAILY BEFORE A MEAL 90 capsule 2   pregabalin (LYRICA) 150 MG capsule TAKE 1 CAPSULE(150 MG) BY MOUTH TWICE DAILY (Patient taking differently: Take 150 mg by mouth 2 (two) times daily.) 60 capsule 5   traMADol (ULTRAM) 50 MG tablet TAKE 1 TABLET(50 MG) BY MOUTH FOUR TIMES DAILY (Patient taking differently: Take 50 mg by mouth 4 (four) times daily.) 120 tablet 3   No current facility-administered medications on file prior to visit.    Allergies  Allergen Reactions   Dextromethorphan Hbr Shortness Of Breath   Keflex [Cephalexin] Shortness Of Breath    Hard time breathing   Robaxin [Methocarbamol] Nausea Only  Stomach pain . Could not eat   Bactrim [Sulfamethoxazole-Trimethoprim] Itching   Hydroxyzine Other (See Comments) and Swelling    Numbness in lips   Prednisone & Diphenhydramine Other (See Comments)    Face flushed, heart racing   Amlodipine Other (See Comments)    Bp worsened. Shaky. Insomnia.   Ciprofloxacin    Dextromethorphan Polistirex Er    Nitrofurantoin    Diphenhydramine Hcl Palpitations   Moxifloxacin Rash   Prednisone Other (See Comments) and Palpitations    "flushing"     Objective:  General: Alert and oriented x3 in no  acute distress  Dermatology: Old surgical scars, no open lesions bilateral lower extremities, no webspace macerations, no ecchymosis bilateral, all nails x 10 are well manicured.  Vascular: Dorsalis Pedis and Posterior Tibial pedal pulses 1/4, Capillary Fill Time 5 seconds, + pedal hair growth bilateral, no edema bilateral lower extremities, Temperature gradient within normal limits.  Neurology: Johney Maine sensation intact via light touch bilateral.  Musculoskeletal: Mild tenderness with palpation at insertion of plantar fascia left and right foot.  There is also diffuse pain along the ankles and the lateral foot at the sinus tarsi there is also significant decreased range of motion left and right ankle joints with the left being more limited.  Right leg is noted to be shorter as compared to the left approximate only at least half inch.   Assessment and Plan: Problem List Items Addressed This Visit       Other   Fibromyalgia   Other Visit Diagnoses     Plantar fasciitis, bilateral    -  Primary   Heel pain, bilateral       Tendonitis       PTTD (posterior tibial tendon dysfunction)       Pes planus of both feet       Arthritis of ankle, left           -Complete examination performed -Previous MRI results reviewed with patient that is significant for arthritis and progressive changes secondary to flatfeet -Discussed treatment options for chronic foot pain, Planter fasciitis and tendinitis secondary to severe end-stage pes planus with gait disturbance  - After oral consent and aseptic prep, injected a mixture containing 1 ml of 2% plain lidocaine, 1 ml 0.5% plain marcaine, 0.5 ml of kenalog 10 and 0.5 ml of dexamethasone phosphate into left and right heels at the plantar fascial insertion without complication. Post-injection care discussed with patient.  -Added additional orthotics to her current shoes of the sketchers arch fit and advised patient to try this if it helps continue with the  if it hurts to remove her orthotics -Recommend patient to see Aaron Edelman for possible AFO versus remaking her orthotics to fit her foot better due to her severe end-stage pes planus and arthritis to see if we can make something that functions better for her deformity -Meanwhile encourage gentle stretching and range of motion exercises as tolerated -Return to office for orthotic assessment with Marcelyn Ditty, DPM

## 2021-11-26 ENCOUNTER — Other Ambulatory Visit: Payer: Self-pay | Admitting: Legal Medicine

## 2021-11-26 DIAGNOSIS — M4310 Spondylolisthesis, site unspecified: Secondary | ICD-10-CM

## 2021-12-01 ENCOUNTER — Other Ambulatory Visit: Payer: Self-pay | Admitting: Legal Medicine

## 2021-12-01 DIAGNOSIS — F064 Anxiety disorder due to known physiological condition: Secondary | ICD-10-CM

## 2021-12-10 ENCOUNTER — Ambulatory Visit: Payer: Medicare HMO

## 2021-12-10 ENCOUNTER — Telehealth: Payer: Self-pay | Admitting: Sports Medicine

## 2021-12-10 NOTE — Telephone Encounter (Signed)
Pt left message today @ 1113 am stating she would not be able to make appt today @ 330 and to put her back on the list for the [place] office for [place]

## 2021-12-22 ENCOUNTER — Other Ambulatory Visit: Payer: Self-pay | Admitting: Legal Medicine

## 2021-12-22 DIAGNOSIS — M797 Fibromyalgia: Secondary | ICD-10-CM

## 2021-12-31 ENCOUNTER — Encounter: Payer: Self-pay | Admitting: Nurse Practitioner

## 2021-12-31 ENCOUNTER — Telehealth (INDEPENDENT_AMBULATORY_CARE_PROVIDER_SITE_OTHER): Payer: Medicare HMO | Admitting: Nurse Practitioner

## 2021-12-31 ENCOUNTER — Telehealth: Payer: Self-pay

## 2021-12-31 VITALS — BP 133/87 | HR 73 | Temp 97.2°F | Ht 64.0 in | Wt 184.0 lb

## 2021-12-31 DIAGNOSIS — U071 COVID-19: Secondary | ICD-10-CM

## 2021-12-31 DIAGNOSIS — Z85118 Personal history of other malignant neoplasm of bronchus and lung: Secondary | ICD-10-CM | POA: Diagnosis not present

## 2021-12-31 MED ORDER — FLUTICASONE PROPIONATE 50 MCG/ACT NA SUSP: 2.0000 | Freq: Every day | NASAL | 6 refills | Status: DC

## 2021-12-31 MED ORDER — MOLNUPIRAVIR EUA 200MG CAPSULE: 4.0000 | ORAL_CAPSULE | Freq: Two times a day (BID) | ORAL | 0 refills | Status: AC

## 2021-12-31 MED ORDER — GUAIFENESIN 100 MG/5ML PO LIQD: 5.0000 mL | ORAL | 0 refills | Status: DC | PRN

## 2021-12-31 NOTE — Progress Notes (Signed)
Virtual Visit via Telephone Note   This visit type was conducted due to national recommendations for restrictions regarding the COVID-19 Pandemic (e.g. social distancing) in an effort to limit this patient's exposure and mitigate transmission in our community.  Due to her co-morbid illnesses, this patient is at least at moderate risk for complications without adequate follow up.  This format is felt to be most appropriate for this patient at this time.  The patient did not have access to video technology/had technical difficulties with video requiring transitioning to audio format only (telephone).  All issues noted in this document were discussed and addressed.  No physical exam could be performed with this format.  Patient verbally consented to a telehealth visit.   Date:  12/31/2021   ID:  Victoria Pena, DOB [birth year], MRN [id number]  Patient Location: Home Provider Location: Office/Clinic  PCP:  Lillard Anes, MD   Evaluation Performed:  Established patient, acute telemedicine visit  Chief Complaint:  COVID-19  History of Present Illness:    Victoria Pena is a 71 y.o. female with Upper respiratory symptoms She complains of congestion, nasal congestion, and non productive cough.Onset of symptoms was yesterday and worseningTreatment has included Tylenol and Ibuprofen. Positive home COVID-19 test yesterday. She has a history of lung cancer.   The patient does have symptoms concerning for COVID-19 infection (fever, chills, cough, or new shortness of breath).    Past Medical History:  Diagnosis Date   Acquired spondylolisthesis 05/12/2015   Adenocarcinoma of right lung, stage 1 ([place]) 07/26/2016   Anxiety    Arthropathy of lumbar facet joint 04/11/2019   Formatting of this note might be different from the original. Added automatically from request for surgery [id number]   Atherosclerosis of aorta ([place]) 07/08/2021   Benign essential hypertension 12/27/2016   BMI  30.0-30.9,adult 12/23/2020   COPD (chronic obstructive pulmonary disease) ([place])    pt denies   Current mild episode of major depressive disorder ([place]) 03/24/2020   Daytime sleepiness 08/03/2021   Depression    Fibromyalgia 03/24/2020   Gastroesophageal reflux disease without esophagitis 03/24/2020   Graves disease 12/27/2016   Hypertension    Osteoporosis 08/03/2021   Pericardial cyst 02/02/2021   Postoperative hypothyroidism 01/17/2015   Reactive airways dysfunction syndrome, unspecified asthma severity, uncomplicated ([place]) 7/74/1287   S/P lobectomy of lung 07/26/2015   Formatting of this note might be different from the original. Right upper   S/P lumbar spinal fusion 05/11/2018   Thyroid disease    Toe ulcer, left, limited to breakdown of skin ([place]) 12/23/2020    Past Surgical History:  Procedure Laterality Date   ANKLE SURGERY     bilateral   APPENDECTOMY     BREAST BIOPSY Left 02/25/2020   BREAST BIOPSY Right 08/17/2018   BREAST EXCISIONAL BIOPSY Right    unsure when but marked with scar marker   CHOLECYSTECTOMY     REPLACEMENT TOTAL KNEE Left    THYROIDECTOMY      Family History  Problem Relation Age of Onset   Cancer Mother    Cancer Father    Cancer Brother    Autism Grandson     Social History   Socioeconomic History   Marital status: Widowed    Spouse name: Not on file   Number of children: Not on file   Years of education: Not on file   Highest education level: Not on file  Occupational History   Not on file  Tobacco Use  Smoking status: Former    Packs/day: 1.50    Years: 35.00    Pack years: 52.50    Types: Cigarettes    Quit date: 07/27/1999    Years since quitting: 22.4   Smokeless tobacco: Never  Vaping Use   Vaping Use: Never used  Substance and Sexual Activity   Alcohol use: No   Drug use: No   Sexual activity: Not Currently  Other Topics Concern   Not on file  Social History Narrative   Not on file   Social Determinants of Health   Financial  Resource Strain: Not on file  Food Insecurity: Not on file  Transportation Needs: Not on file  Physical Activity: Not on file  Stress: Not on file  Social Connections: Not on file  Intimate Partner Violence: Not on file    Outpatient Medications Prior to Visit  Medication Sig Dispense Refill   ALPRAZolam (XANAX) 0.5 MG tablet TAKE 1 TABLET(0.5 MG) BY MOUTH TWICE DAILY AS NEEDED FOR ANXIETY 60 tablet 3   buPROPion (WELLBUTRIN XL) 150 MG 24 hr tablet Take 1 tablet (150 mg total) by mouth daily. 90 tablet 2   calcium carbonate (TUMS - DOSED IN MG ELEMENTAL CALCIUM) 500 MG chewable tablet Chew 1 tablet by mouth 2 (two) times daily as needed for indigestion or heartburn.     D-Mannose 500 MG CAPS Take 500 mg by mouth 2 (two) times daily.     ibuprofen (ADVIL,MOTRIN) 200 MG tablet Take 600 mg by mouth 2 (two) times daily as needed for mild pain.     levothyroxine (SYNTHROID) 137 MCG tablet TAKE 1 TABLET (137 MCG TOTAL) BY MOUTH DAILY. 90 tablet 2   losartan (COZAAR) 100 MG tablet Take 1 tablet (100 mg total) by mouth daily. 1 tablet 0   meloxicam (MOBIC) 15 MG tablet TAKE 1 TABLET(15 MG) BY MOUTH DAILY (Patient taking differently: Take 15 mg by mouth daily. TAKE 1 TABLET(15 MG) BY MOUTH DAILY) 90 tablet 1   metroNIDAZOLE (METROGEL) 0.75 % gel Apply 1 application topically 2 (two) times daily. 45 g 3   omeprazole (PRILOSEC) 40 MG capsule TAKE 1 CAPSULE ONE TIME DAILY BEFORE A MEAL 90 capsule 2   pregabalin (LYRICA) 150 MG capsule TAKE 1 CAPSULE(150 MG) BY MOUTH TWICE DAILY 60 capsule 5   traMADol (ULTRAM) 50 MG tablet TAKE 1 TABLET(50 MG) BY MOUTH FOUR TIMES DAILY 120 tablet 2   No facility-administered medications prior to visit.    Allergies:   Dextromethorphan hbr, Keflex [cephalexin], Robaxin [methocarbamol], Bactrim [sulfamethoxazole-trimethoprim], Hydroxyzine, Prednisone & diphenhydramine, Amlodipine, Ciprofloxacin, Dextromethorphan polistirex er, Nitrofurantoin, Diphenhydramine hcl,  Moxifloxacin, and Prednisone   Social History   Tobacco Use   Smoking status: Former    Packs/day: 1.50    Years: 35.00    Pack years: 52.50    Types: Cigarettes    Quit date: 07/27/1999    Years since quitting: 22.4   Smokeless tobacco: Never  Vaping Use   Vaping Use: Never used  Substance Use Topics   Alcohol use: No   Drug use: No     Review of Systems  Constitutional:  Positive for chills and malaise/fatigue. Negative for fever.  HENT:  Positive for congestion and sore throat. Negative for ear pain and sinus pain.   Respiratory:  Positive for cough. Negative for shortness of breath.   Cardiovascular:  Negative for chest pain.  Gastrointestinal:  Negative for nausea and vomiting.  Musculoskeletal:  Negative for joint pain and myalgias.  Neurological:  Positive for headaches.    Labs/Other Tests and Data Reviewed:    Recent Labs: 11/06/2021: ALT 13; BUN 21; Creatinine, Ser 0.67; Hemoglobin 14.9; Platelets 264; Potassium 3.8; Sodium 142; TSH 0.803   Recent Lipid Panel Lab Results  Component Value Date/Time   CHOL 207 (H) 07/08/2021 02:36 PM   TRIG 54 07/08/2021 02:36 PM   HDL 85 07/08/2021 02:36 PM   CHOLHDL 2.4 07/08/2021 02:36 PM   LDLCALC 112 (H) 07/08/2021 02:36 PM    Wt Readings from Last 3 Encounters:  12/31/21 184 lb (83.5 kg)  11/09/21 181 lb (82.1 kg)  11/06/21 184 lb (83.5 kg)     Objective:    Vital Signs:  Ht 5\' 4"  (1.626 m)    Wt 184 lb (83.5 kg)    LMP  (LMP Unknown)    BMI 31.58 kg/m    Physical Exam No physical exam due to telemedicine visit  ASSESSMENT & PLAN:     1. COVID-19 - molnupiravir EUA (LAGEVRIO) 200 mg CAPS capsule; Take 4 capsules (800 mg total) by mouth 2 (two) times daily for 5 days.  Dispense: 40 capsule; Refill: 0 - fluticasone (FLONASE) 50 MCG/ACT nasal spray; Place 2 sprays into both nostrils daily.  Dispense: 16 g; Refill: 6 - guaiFENesin (ROBITUSSIN) 100 MG/5ML liquid; Take 5 mLs by mouth every 4 (four) hours as needed  for cough or to loosen phlegm.  Dispense: 120 mL; Refill: 0  2. History of lung cancer     Rest and push fluids Continue OTC Tylenol,Ibuprofen as directed Seek emergency medical care if symptoms suddenly worsen  Follow-up as needed        COVID-19 Education: The signs and symptoms of COVID-19 were discussed with the patient and how to seek care for testing (follow up with PCP or arrange E-visit). The importance of social distancing was discussed today.   I spent 10 minutes dedicated to the care of this patient on the date of this encounter to include face-to-face time with the patient, as well as: EMR review and prescription medication management  Follow Up:  In Person prn  Signed,  Jerrell Belfast, DNP  12/31/2021 12:26 PM   [place]

## 2021-12-31 NOTE — Telephone Encounter (Signed)
Patient called as she tested positive for COVID at home this AM. Complains of cough, congestion, and ST. Denies fever and body aches. Pt has hx of COPD, lung lobectomy.   No available appointments. Video visit scheduled.   Royce Macadamia, [place] 12/31/21 10:08 AM

## 2022-01-01 ENCOUNTER — Other Ambulatory Visit: Payer: Self-pay | Admitting: Legal Medicine

## 2022-01-01 DIAGNOSIS — M1990 Unspecified osteoarthritis, unspecified site: Secondary | ICD-10-CM

## 2022-01-05 ENCOUNTER — Other Ambulatory Visit: Payer: Self-pay

## 2022-01-05 MED ORDER — AEROCHAMBER PLUS FLOW VU MISC: 0 refills | Status: DC

## 2022-01-05 NOTE — Telephone Encounter (Signed)
Original pharmacy did not have supply. Sent to different pharmacy.   Victoria Pena 01/05/22 4:54 PM

## 2022-01-05 NOTE — Telephone Encounter (Signed)
Patient left voicemail stating she needed a new Aero Chamber plus flow VU spacer due to hers cracking.

## 2022-01-13 ENCOUNTER — Ambulatory Visit: Payer: Medicare HMO | Admitting: Legal Medicine

## 2022-01-21 ENCOUNTER — Ambulatory Visit (HOSPITAL_COMMUNITY): Admission: RE | Admit: 2022-01-21 | Payer: Medicare HMO | Source: Ambulatory Visit

## 2022-01-21 ENCOUNTER — Encounter (HOSPITAL_COMMUNITY): Payer: Self-pay

## 2022-01-25 ENCOUNTER — Other Ambulatory Visit: Payer: Self-pay

## 2022-01-25 ENCOUNTER — Ambulatory Visit (INDEPENDENT_AMBULATORY_CARE_PROVIDER_SITE_OTHER): Payer: Medicare HMO | Admitting: Legal Medicine

## 2022-01-25 ENCOUNTER — Encounter: Payer: Self-pay | Admitting: Legal Medicine

## 2022-01-25 ENCOUNTER — Telehealth: Payer: Self-pay | Admitting: Internal Medicine

## 2022-01-25 VITALS — BP 120/84 | HR 90 | Temp 98.3°F | Resp 16 | Ht 64.0 in | Wt 185.0 lb

## 2022-01-25 DIAGNOSIS — C3491 Malignant neoplasm of unspecified part of right bronchus or lung: Secondary | ICD-10-CM | POA: Diagnosis not present

## 2022-01-25 DIAGNOSIS — I700 Atherosclerosis of aorta: Secondary | ICD-10-CM

## 2022-01-25 DIAGNOSIS — M25579 Pain in unspecified ankle and joints of unspecified foot: Secondary | ICD-10-CM | POA: Insufficient documentation

## 2022-01-25 DIAGNOSIS — J432 Centrilobular emphysema: Secondary | ICD-10-CM | POA: Diagnosis not present

## 2022-01-25 DIAGNOSIS — K219 Gastro-esophageal reflux disease without esophagitis: Secondary | ICD-10-CM

## 2022-01-25 DIAGNOSIS — Z8639 Personal history of other endocrine, nutritional and metabolic disease: Secondary | ICD-10-CM | POA: Diagnosis not present

## 2022-01-25 DIAGNOSIS — F419 Anxiety disorder, unspecified: Secondary | ICD-10-CM | POA: Diagnosis not present

## 2022-01-25 DIAGNOSIS — I10 Essential (primary) hypertension: Secondary | ICD-10-CM | POA: Diagnosis not present

## 2022-01-25 DIAGNOSIS — M25572 Pain in left ankle and joints of left foot: Secondary | ICD-10-CM

## 2022-01-25 DIAGNOSIS — M810 Age-related osteoporosis without current pathological fracture: Secondary | ICD-10-CM

## 2022-01-25 DIAGNOSIS — R5383 Other fatigue: Secondary | ICD-10-CM | POA: Diagnosis not present

## 2022-01-25 DIAGNOSIS — M797 Fibromyalgia: Secondary | ICD-10-CM

## 2022-01-25 DIAGNOSIS — F320 Major depressive disorder, single episode, mild: Secondary | ICD-10-CM

## 2022-01-25 DIAGNOSIS — G8929 Other chronic pain: Secondary | ICD-10-CM

## 2022-01-25 DIAGNOSIS — M25571 Pain in right ankle and joints of right foot: Secondary | ICD-10-CM

## 2022-01-25 MED ORDER — HYDROCODONE-ACETAMINOPHEN 10-325 MG PO TABS: 1.0000 | ORAL_TABLET | Freq: Three times a day (TID) | ORAL | 0 refills | Status: AC | PRN

## 2022-01-25 NOTE — Telephone Encounter (Signed)
R/s per 2/6 inbasket, req to r/s, patient r/s , left msg.

## 2022-01-25 NOTE — Progress Notes (Signed)
Subjective:  Patient ID: Victoria Pena, female    DOB: 17-Jul-1951  Age: 71 y.o. MRN: [id number]  Chief Complaint  Patient presents with   Hypertension   COPD   Fibromyalgia    HPI Hypertension: Patient is taking Losartan 100 mg daily. Patient presents for follow up of hypertension.  Patient tolerating losartan well with side effects.  Patient was diagnosed with hypertension 2010 so has been treated for hypertension for 12 years.Patient is working on maintaining diet and exercise regimen and follows up as directed. Complication include none.   GERD: She is currently taking Omeprazole 40 mg daily. Patient has gastroesophageal reflux symptoms withesophagitis and LTRD.  The symptoms are moderate intensity.  Length of symptoms 10 years.  Medicines include omeprazole.  Complications include none .  Anxiety: She takes Alprazolam 0.5 mg twice daily as needed, wellbutrin 150 mg daily.  Current Outpatient Medications on File Prior to Visit  Medication Sig Dispense Refill   ALPRAZolam (XANAX) 0.5 MG tablet TAKE 1 TABLET(0.5 MG) BY MOUTH TWICE DAILY AS NEEDED FOR ANXIETY 60 tablet 3   buPROPion (WELLBUTRIN XL) 150 MG 24 hr tablet Take 1 tablet (150 mg total) by mouth daily. 90 tablet 2   calcium carbonate (TUMS - DOSED IN MG ELEMENTAL CALCIUM) 500 MG chewable tablet Chew 1 tablet by mouth 2 (two) times daily as needed for indigestion or heartburn.     fluticasone (FLONASE) 50 MCG/ACT nasal spray Place 2 sprays into both nostrils daily. 16 g 6   ibuprofen (ADVIL,MOTRIN) 200 MG tablet Take 600 mg by mouth 2 (two) times daily as needed for mild pain.     levothyroxine (SYNTHROID) 137 MCG tablet TAKE 1 TABLET (137 MCG TOTAL) BY MOUTH DAILY. 90 tablet 2   losartan (COZAAR) 100 MG tablet Take 1 tablet (100 mg total) by mouth daily. 1 tablet 0   meloxicam (MOBIC) 15 MG tablet TAKE 1 TABLET EVERY DAY 90 tablet 1   metroNIDAZOLE (METROGEL) 0.75 % gel Apply 1 application topically 2 (two) times daily. 45  g 3   omeprazole (PRILOSEC) 40 MG capsule TAKE 1 CAPSULE ONE TIME DAILY BEFORE A MEAL 90 capsule 2   pregabalin (LYRICA) 150 MG capsule TAKE 1 CAPSULE(150 MG) BY MOUTH TWICE DAILY 60 capsule 5   Spacer/Aero-Holding Chambers (AEROCHAMBER MAX W/FLOW-VU) MISC Use as directed. 1 each 0   traMADol (ULTRAM) 50 MG tablet TAKE 1 TABLET(50 MG) BY MOUTH FOUR TIMES DAILY 120 tablet 2   D-Mannose 500 MG CAPS Take 500 mg by mouth 2 (two) times daily. (Patient not taking: Reported on 01/25/2022)     No current facility-administered medications on file prior to visit.   Past Medical History:  Diagnosis Date   Acquired spondylolisthesis 05/12/2015   Adenocarcinoma of right lung, stage 1 ([place]) 07/26/2016   Anxiety    Arthropathy of lumbar facet joint 04/11/2019   Formatting of this note might be different from the original. Added automatically from request for surgery [id number]   Atherosclerosis of aorta ([place]) 07/08/2021   Benign essential hypertension 12/27/2016   BMI 30.0-30.9,adult 12/23/2020   COPD (chronic obstructive pulmonary disease) ([place])    pt denies   Current mild episode of major depressive disorder ([place]) 03/24/2020   Daytime sleepiness 08/03/2021   Depression    Fibromyalgia 03/24/2020   Gastroesophageal reflux disease without esophagitis 03/24/2020   Graves disease 12/27/2016   Hypertension    Osteoporosis 08/03/2021   Pericardial cyst 02/02/2021   Postoperative hypothyroidism 01/17/2015   Reactive  airways dysfunction syndrome, unspecified asthma severity, uncomplicated ([place]) 7/51/0258   S/P lobectomy of lung 07/26/2015   Formatting of this note might be different from the original. Right upper   S/P lumbar spinal fusion 05/11/2018   Thyroid disease    Toe ulcer, left, limited to breakdown of skin ([place]) 12/23/2020   Past Surgical History:  Procedure Laterality Date   ANKLE SURGERY     bilateral   APPENDECTOMY     BREAST BIOPSY Left 02/25/2020   BREAST BIOPSY Right 08/17/2018   BREAST EXCISIONAL BIOPSY  Right    unsure when but marked with scar marker   CHOLECYSTECTOMY     REPLACEMENT TOTAL KNEE Left    THYROIDECTOMY      Family History  Problem Relation Age of Onset   Cancer Mother    Cancer Father    Cancer Brother    Autism Grandson    Social History   Socioeconomic History   Marital status: Widowed    Spouse name: Not on file   Number of children: Not on file   Years of education: Not on file   Highest education level: Not on file  Occupational History   Not on file  Tobacco Use   Smoking status: Former    Packs/day: 1.50    Years: 35.00    Pack years: 52.50    Types: Cigarettes    Quit date: 07/27/1999    Years since quitting: 22.5   Smokeless tobacco: Never  Vaping Use   Vaping Use: Never used  Substance and Sexual Activity   Alcohol use: No   Drug use: No   Sexual activity: Not Currently  Other Topics Concern   Not on file  Social History Narrative   Not on file   Social Determinants of Health   Financial Resource Strain: Not on file  Food Insecurity: Not on file  Transportation Needs: Not on file  Physical Activity: Not on file  Stress: Not on file  Social Connections: Not on file    Review of Systems  Constitutional:  Negative for chills, fatigue and fever.  HENT:  Negative for congestion, ear pain and sore throat.   Respiratory:  Positive for cough and shortness of breath.   Cardiovascular:  Negative for chest pain and palpitations.  Gastrointestinal:  Negative for abdominal pain, constipation, diarrhea, nausea and vomiting.  Endocrine: Negative for polydipsia, polyphagia and polyuria.  Genitourinary:  Negative for difficulty urinating and dysuria.  Musculoskeletal:  Negative for arthralgias, back pain and myalgias.  Skin:  Negative for rash.  Neurological:  Negative for headaches.  Psychiatric/Behavioral:  Negative for dysphoric mood. The patient is not nervous/anxious.     Objective:  BP 120/84    Pulse 90    Temp 98.3 F (36.8 C)     Resp 16    Ht 5\' 4"  (1.626 m)    Wt 185 lb (83.9 kg)    LMP  (LMP Unknown)    SpO2 96%    BMI 31.76 kg/m   BP/Weight 01/25/2022 12/31/2021 52/77/8242  Systolic BP 353 614 431  Diastolic BP 84 87 80  Wt. (Lbs) 185 184 181  BMI 31.76 31.58 31.07    Physical Exam Vitals reviewed.  Constitutional:      General: She is not in acute distress.    Appearance: Normal appearance.  HENT:     Right Ear: Tympanic membrane normal.     Left Ear: Tympanic membrane normal.     Nose: Nose normal.  Mouth/Throat:     Mouth: Mucous membranes are moist.     Pharynx: Oropharynx is clear.  Eyes:     Extraocular Movements: Extraocular movements intact.     Conjunctiva/sclera: Conjunctivae normal.     Pupils: Pupils are equal, round, and reactive to light.  Cardiovascular:     Rate and Rhythm: Normal rate and regular rhythm.     Pulses: Normal pulses.     Heart sounds: Normal heart sounds. No murmur heard.   No gallop.  Pulmonary:     Effort: Pulmonary effort is normal. No respiratory distress.     Breath sounds: No wheezing.  Abdominal:     General: Abdomen is flat. Bowel sounds are normal. There is no distension.     Tenderness: There is no abdominal tenderness.  Musculoskeletal:        General: Normal range of motion.     Cervical back: Normal range of motion and neck supple.     Right lower leg: No edema.     Left lower leg: No edema.  Skin:    General: Skin is warm.     Capillary Refill: Capillary refill takes less than 2 seconds.  Neurological:     General: No focal deficit present.     Mental Status: She is alert and oriented to person, place, and time. Mental status is at baseline.     Gait: Gait abnormal.     Deep Tendon Reflexes: Reflexes normal.  Psychiatric:        Mood and Affect: Mood normal.        Thought Content: Thought content normal.        Judgment: Judgment normal.        Lab Results  Component Value Date   WBC 6.5 11/06/2021   HGB 14.9 11/06/2021   HCT 46.9  (H) 11/06/2021   PLT 264 11/06/2021   GLUCOSE 96 11/06/2021   CHOL 207 (H) 07/08/2021   TRIG 54 07/08/2021   HDL 85 07/08/2021   LDLCALC 112 (H) 07/08/2021   ALT 13 11/06/2021   AST 16 11/06/2021   NA 142 11/06/2021   K 3.8 11/06/2021   CL 108 11/06/2021   CREATININE 0.67 11/06/2021   BUN 21 11/06/2021   CO2 25 11/06/2021   TSH 0.803 11/06/2021   INR 0.95 05/03/2018      Assessment & Plan:   Problem List Items Addressed This Visit       Cardiovascular and Mediastinum   Benign essential hypertension - Primary   Relevant Orders   Comprehensive metabolic panel   Lipid panel   CBC with Differential/Platelet An individual hypertension care plan was established and reinforced today.  The patient's status was assessed using clinical findings on exam and labs or diagnostic tests. The patient's success at meeting treatment goals on disease specific evidence-based guidelines and found to be well controlled. SELF MANAGEMENT: The patient and I together assessed ways to personally work towards obtaining the recommended goals. RECOMMENDATIONS: avoid decongestants found in common cold remedies, decrease consumption of alcohol, perform routine monitoring of BP with home BP cuff, exercise, reduction of dietary salt, take medicines as prescribed, try not to miss doses and quit smoking.  Regular exercise and maintaining a healthy weight is needed.  Stress reduction may help. A CLINICAL SUMMARY including written plan identify barriers to care unique to individual due to social or financial issues.  We attempt to mutually creat solutions for individual and family understanding.    Atherosclerosis of aorta (  [place]) Patient found to have aortic atherosclerosis we discussed need for statin or similar medicine     Respiratory   Adenocarcinoma of right lung, stage 1 ([place]) Patient has stable adenocarcinoma with lobectomy   COPD (chronic obstructive pulmonary disease) ([place])   Relevant Orders   PR MOUTH  PIECE An individualize plan was formulated for care of COPD.  Treatment is evidence based.  She will continue on inhalers, avoid smoking and smoke.  Regular exercise with help with dyspnea. Routine follow ups and medication compliance is needed.      Digestive   Gastroesophageal reflux disease without esophagitis Plan of care was formulated today.  She is doing well.  A plan of care was formulated using patient exam, tests and other sources to optimize care using evidence based information.  Recommend no smoking, no eating after supper, avoid fatty foods, elevate Head of bed, avoid tight fitting clothing.  Continue on omeprazole.      Musculoskeletal and Integument   Osteoporosis   Relevant Orders   VITAMIN D 25 Hydroxy (Vit-D Deficiency, Fractures) Patient has osteoporosis and of  vitamin D     Other   Anxiety Anxiety stable    Current mild episode of major depressive disorder (HCC) Depression stable on buproprion    Fibromyalgia   Relevant Medications   HYDROcodone-acetaminophen (NORCO) 10-325 MG tablet Chronic fibromyalgia stable    History of Graves'  Patient had thyroidectomy    Ankle pain   Relevant Medications   HYDROcodone-acetaminophen (NORCO) 10-325 MG tablet Chronic ankle pain from fractures and surgery.  Pain walking.  She is to see her orthopedist   Other Visit Diagnoses     Other fatigue       Relevant Orders   TSH Patient tired, check TSH     .  Meds ordered this encounter  Medications   HYDROcodone-acetaminophen (NORCO) 10-325 MG tablet    Sig: Take 1 tablet by mouth every 8 (eight) hours as needed for up to 5 days.    Dispense:  15 tablet    Refill:  0    Orders Placed This Encounter  Procedures   Comprehensive metabolic panel   Lipid panel   CBC with Differential/Platelet   VITAMIN D 25 Hydroxy (Vit-D Deficiency, Fractures)   TSH   PR MOUTH PIECE     Follow-up: No follow-ups on file.  An After Visit Summary was printed and given to the  patient.  Reinaldo Meeker, MD [facility] [phone]

## 2022-01-26 LAB — CBC WITH DIFFERENTIAL/PLATELET
Basophils Absolute: 0 10*3/uL (ref 0.0–0.2)
Basos: 1 %
EOS (ABSOLUTE): 0.1 10*3/uL (ref 0.0–0.4)
Eos: 3 %
Hematocrit: 41.8 % (ref 34.0–46.6)
Hemoglobin: 14.4 g/dL (ref 11.1–15.9)
Immature Grans (Abs): 0 10*3/uL (ref 0.0–0.1)
Immature Granulocytes: 1 %
Lymphocytes Absolute: 1.5 10*3/uL (ref 0.7–3.1)
Lymphs: 30 %
MCH: 29.6 pg (ref 26.6–33.0)
MCHC: 34.4 g/dL (ref 31.5–35.7)
MCV: 86 fL (ref 79–97)
Monocytes Absolute: 0.5 10*3/uL (ref 0.1–0.9)
Monocytes: 10 %
Neutrophils Absolute: 2.8 10*3/uL (ref 1.4–7.0)
Neutrophils: 55 %
Platelets: 283 10*3/uL (ref 150–450)
RBC: 4.87 x10E6/uL (ref 3.77–5.28)
RDW: 13.1 % (ref 11.7–15.4)
WBC: 5 10*3/uL (ref 3.4–10.8)

## 2022-01-26 LAB — COMPREHENSIVE METABOLIC PANEL
ALT: 15 IU/L (ref 0–32)
AST: 15 IU/L (ref 0–40)
Albumin/Globulin Ratio: 1.8 (ref 1.2–2.2)
Albumin: 4.4 g/dL (ref 3.7–4.7)
Alkaline Phosphatase: 175 IU/L — ABNORMAL HIGH (ref 44–121)
BUN/Creatinine Ratio: 29 — ABNORMAL HIGH (ref 12–28)
BUN: 20 mg/dL (ref 8–27)
Bilirubin Total: 0.4 mg/dL (ref 0.0–1.2)
CO2: 24 mmol/L (ref 20–29)
Calcium: 9.8 mg/dL (ref 8.7–10.3)
Chloride: 102 mmol/L (ref 96–106)
Creatinine, Ser: 0.69 mg/dL (ref 0.57–1.00)
Globulin, Total: 2.5 g/dL (ref 1.5–4.5)
Glucose: 103 mg/dL — ABNORMAL HIGH (ref 70–99)
Potassium: 4.9 mmol/L (ref 3.5–5.2)
Sodium: 138 mmol/L (ref 134–144)
Total Protein: 6.9 g/dL (ref 6.0–8.5)
eGFR: 93 mL/min/{1.73_m2} (ref >59)

## 2022-01-26 LAB — LIPID PANEL
Chol/HDL Ratio: 2.4 ratio (ref 0.0–4.4)
Cholesterol, Total: 206 mg/dL — ABNORMAL HIGH (ref 100–199)
HDL: 85 mg/dL (ref >39)
LDL Chol Calc (NIH): 109 mg/dL — ABNORMAL HIGH (ref 0–99)
Triglycerides: 69 mg/dL (ref 0–149)
VLDL Cholesterol Cal: 12 mg/dL (ref 5–40)

## 2022-01-26 LAB — CARDIOVASCULAR RISK ASSESSMENT

## 2022-01-26 LAB — VITAMIN D 25 HYDROXY (VIT D DEFICIENCY, FRACTURES): Vit D, 25-Hydroxy: 22.3 ng/mL — ABNORMAL LOW (ref 30.0–100.0)

## 2022-01-26 LAB — TSH: TSH: 0.945 u[IU]/mL (ref 0.450–4.500)

## 2022-01-26 NOTE — Progress Notes (Signed)
Glucose 103, kidney tests ok, may be some dehydration, liver tests ok, LDL cholesterol 109 high,  lp

## 2022-01-27 ENCOUNTER — Inpatient Hospital Stay: Payer: Medicare HMO

## 2022-01-27 ENCOUNTER — Inpatient Hospital Stay: Payer: Medicare HMO | Attending: Internal Medicine | Admitting: Internal Medicine

## 2022-01-28 ENCOUNTER — Ambulatory Visit (HOSPITAL_COMMUNITY)
Admission: RE | Admit: 2022-01-28 | Discharge: 2022-01-28 | Disposition: A | Discharge status: Home / Self Care | Payer: Medicare HMO | Source: Ambulatory Visit | Attending: Internal Medicine | Admitting: Internal Medicine

## 2022-01-28 ENCOUNTER — Other Ambulatory Visit: Payer: Self-pay

## 2022-01-28 DIAGNOSIS — J439 Emphysema, unspecified: Secondary | ICD-10-CM | POA: Diagnosis not present

## 2022-01-28 DIAGNOSIS — I700 Atherosclerosis of aorta: Secondary | ICD-10-CM | POA: Diagnosis not present

## 2022-01-28 DIAGNOSIS — C3490 Malignant neoplasm of unspecified part of unspecified bronchus or lung: Secondary | ICD-10-CM | POA: Diagnosis not present

## 2022-01-28 MED ORDER — IOHEXOL 300 MG/ML  SOLN
75.0000 mL | Freq: Once | INTRAMUSCULAR | Status: AC | PRN
  Administered 2022-01-28: 75 mL via INTRAVENOUS

## 2022-01-28 MED ORDER — SODIUM CHLORIDE (PF) 0.9 % IJ SOLN
INTRAMUSCULAR | Status: AC
  Filled 2022-01-28: qty 50

## 2022-02-10 ENCOUNTER — Other Ambulatory Visit: Payer: Medicare HMO

## 2022-02-10 ENCOUNTER — Ambulatory Visit: Payer: Medicare HMO | Admitting: Physician Assistant

## 2022-02-11 ENCOUNTER — Telehealth: Payer: Self-pay

## 2022-02-11 NOTE — Telephone Encounter (Signed)
BP at goal 

## 2022-02-11 NOTE — Chronic Care Management (AMB) (Signed)
Chronic Care Management Pharmacy Assistant   Name: Victoria Pena  MRN: [id number] DOB: 05-Jan-1951  Reason for Encounter: Disease State/ Hypertension  Recent office visits:  01-25-2022 Lillard Anes, MD. Glucose= 103, BUN/Creatinine= 29, Alkaline phosphate= 175. Cholesterol= 206, LDL= 109. Vitamin D= 22.3. FINISHED Robitussin. START Hydrocodone 10-325 mg every 8 hours as needed.  12-31-2021 Rip Harbour, NP. START flonase 2 sprays into both nostrils daily, Robitussin 5 mLs every 4 hours as needed, Lagevrio 800 mg 4 capsule twice daily for 5 days.  11-09-2021 Lillard Anes, MD. Follow up visit. "Put on amlodipine but BP increased and headache.  Better BP off amlodipine. Headaches stopped.  She is checking BP every few hours. We discussed not chacking it but once or twice a week unless she feels bad".   10-27-2021 Flu vaccine given.  10-19-2021 Lillard Anes, MD. Orders placed for CT head w/o contrast. FINISHED albuterol inhaler, augmentin and levaquin.  09-29-2021 Bramblett, Peggye Pitt, CMA. Abnormal UA and urine culture.  09-15-2021 Rip Harbour, NP. Abnormal UA and urine culture. START Augmentin 875-125 mg twice daily.  Recent consult visits:  11-25-2021 Landis Martins, DPM (Podiatry). Kenelog injection given in left and right heels.  Hospital visits:  Medication Reconciliation was completed by comparing discharge summary, patients EMR and Pharmacy list, and upon discussion with patient.  Admitted to the hospital on 11-06-2021 due to Nonintractable headache. Discharge date was 11-06-2021. Discharged from [place]?Medications Started at [facility] Discharge:?? None  Medication Changes at Hospital Discharge: None  Medications Discontinued at Hospital Discharge: None  Medications that remain the same after Hospital Discharge:??  -All other medications will remain the same.    Medications: Outpatient  Encounter Medications as of 02/11/2022  Medication Sig   ALPRAZolam (XANAX) 0.5 MG tablet TAKE 1 TABLET(0.5 MG) BY MOUTH TWICE DAILY AS NEEDED FOR ANXIETY   buPROPion (WELLBUTRIN XL) 150 MG 24 hr tablet Take 1 tablet (150 mg total) by mouth daily.   calcium carbonate (TUMS - DOSED IN MG ELEMENTAL CALCIUM) 500 MG chewable tablet Chew 1 tablet by mouth 2 (two) times daily as needed for indigestion or heartburn.   D-Mannose 500 MG CAPS Take 500 mg by mouth 2 (two) times daily. (Patient not taking: Reported on 01/25/2022)   fluticasone (FLONASE) 50 MCG/ACT nasal spray Place 2 sprays into both nostrils daily.   ibuprofen (ADVIL,MOTRIN) 200 MG tablet Take 600 mg by mouth 2 (two) times daily as needed for mild pain.   levothyroxine (SYNTHROID) 137 MCG tablet TAKE 1 TABLET (137 MCG TOTAL) BY MOUTH DAILY.   losartan (COZAAR) 100 MG tablet Take 1 tablet (100 mg total) by mouth daily.   meloxicam (MOBIC) 15 MG tablet TAKE 1 TABLET EVERY DAY   metroNIDAZOLE (METROGEL) 0.75 % gel Apply 1 application topically 2 (two) times daily.   omeprazole (PRILOSEC) 40 MG capsule TAKE 1 CAPSULE ONE TIME DAILY BEFORE A MEAL   pregabalin (LYRICA) 150 MG capsule TAKE 1 CAPSULE(150 MG) BY MOUTH TWICE DAILY   Spacer/Aero-Holding Chambers (AEROCHAMBER MAX W/FLOW-VU) MISC Use as directed.   traMADol (ULTRAM) 50 MG tablet TAKE 1 TABLET(50 MG) BY MOUTH FOUR TIMES DAILY   No facility-administered encounter medications on file as of 02/11/2022.    Recent Office Vitals: BP Readings from Last 3 Encounters:  01/25/22 120/84  12/31/21 133/87  11/09/21 110/80   Pulse Readings from Last 3 Encounters:  01/25/22 90  12/31/21 73  11/09/21 79  Wt Readings from Last 3 Encounters:  01/25/22 185 lb (83.9 kg)  12/31/21 184 lb (83.5 kg)  11/09/21 181 lb (82.1 kg)     Kidney Function Lab Results  Component Value Date/Time   CREATININE 0.69 01/25/2022 02:10 PM   CREATININE 0.67 11/06/2021 07:21 PM   CREATININE 0.74 01/23/2021  02:53 PM   CREATININE 0.77 01/25/2020 01:45 PM   CREATININE 0.8 11/22/2017 02:51 PM   CREATININE 0.8 05/24/2017 09:34 AM   GFRNONAA >60 11/06/2021 07:21 PM   GFRNONAA >60 01/23/2021 02:53 PM   GFRAA 106 12/23/2020 11:23 AM   GFRAA >60 01/25/2020 01:45 PM    BMP Latest Ref Rng & Units 01/25/2022 11/06/2021 09/16/2021  Glucose 70 - 99 mg/dL 103(H) 96 95  BUN 8 - 27 mg/dL 20 21 18   Creatinine 0.57 - 1.00 mg/dL 0.69 0.67 0.62  BUN/Creat Ratio 12 - 28 29(H) - 29(H)  Sodium 134 - 144 mmol/L 138 142 141  Potassium 3.5 - 5.2 mmol/L 4.9 3.8 4.3  Chloride 96 - 106 mmol/L 102 108 104  CO2 20 - 29 mmol/L 24 25 22   Calcium 8.7 - 10.3 mg/dL 9.8 9.9 9.1     Current antihypertensive regimen:  Losartan  Patient verbally confirms she is taking the above medications as directed. Yes  How often are you checking your Blood Pressure? 1-2x per week  she checks her blood pressure in the morning after taking her medication.  Current home BP readings: 127/86 Wrist or arm cuff: wrist Caffeine intake: Patient stated very limited Salt intake: limited OTC medications including pseudoephedrine or NSAIDs? Tylenol, aloe vera, d-mannose  Any readings above 180/120? No  What recent interventions/DTPs have been made by any provider to improve Blood Pressure control since last CPP Visit:  Check BP as needed, document, and provide at future appointments Ensure daily salt intake < 2300 mg/day  Any recent hospitalizations or ED visits since last visit with CPP? No  What diet changes have been made to improve Blood Pressure Control?  Patient stated she has limited her salt intake and drinks water daily with Crystal light added.  What exercise is being done to improve your Blood Pressure Control?  Patient stated she is active daily around the house and running errands.  Adherence Review: Is the patient currently on ACE/ARB medication? Yes Does the patient have >5 day gap between last estimated fill dates?  CPP to review  Care Gaps: Last annual wellness visit? None Tdap overdue Hep C screening overdue Shingrix overdue PNA vac overdue Colonoscopy overdue  Star Rating Drugs: Losartan 100 mg- Last filled 01-15-2022 90 DS  [place] Clinical Pharmacist Assistant [phone]

## 2022-02-18 DIAGNOSIS — E079 Disorder of thyroid, unspecified: Secondary | ICD-10-CM | POA: Diagnosis not present

## 2022-02-18 DIAGNOSIS — H5789 Other specified disorders of eye and adnexa: Secondary | ICD-10-CM | POA: Diagnosis not present

## 2022-02-18 NOTE — Progress Notes (Unsigned)
[name]  Lillard Anes, MD [address] [place] [address]  DIAGNOSIS: Stage IB (T2a, N0, M0) non-small cell lung cancer, adenocarcinoma diagnosed in July 2016  PRIOR THERAPY: 1) Status post right upper lobectomy with lymph node dissection on 07/22/2015 in [place]. 2) Adjuvant systemic chemotherapy with cisplatin and Alimta status post 4 cycles completed in November 2016.  CURRENT THERAPY: Observation  INTERVAL HISTORY: Victoria Pena 71 y.o. female returns to the clinic for a follow up visit. The patient is feeling well today without any concerning complaints. She was last seen in the clinic 1 year ago. Denies any fever, chills, night sweats, or weight loss. Denies any chest pain or hemoptysis. She reports her baseline dyspnea on exertion and chronic cough. Denies any nausea, vomiting, diarrhea, or constipation. Denies any headache or visual changes. She recently had a restaging CT scan. The patient is here today for evaluation and to review her scan results.    MEDICAL HISTORY: Past Medical History:  Diagnosis Date   Acquired spondylolisthesis 05/12/2015   Adenocarcinoma of right lung, stage 1 ([place]) 07/26/2016   Anxiety    Arthropathy of lumbar facet joint 04/11/2019   Formatting of this note might be different from the original. Added automatically from request for surgery [id number]   Atherosclerosis of aorta ([place]) 07/08/2021   Benign essential hypertension 12/27/2016   BMI 30.0-30.9,adult 12/23/2020   COPD (chronic obstructive pulmonary disease) ([place])    pt denies   Current mild episode of major depressive disorder ([place]) 03/24/2020   Daytime sleepiness 08/03/2021   Depression    Fibromyalgia 03/24/2020   Gastroesophageal reflux disease without esophagitis 03/24/2020   Graves disease 12/27/2016   Hypertension    Osteoporosis 08/03/2021   Pericardial cyst 02/02/2021   Postoperative hypothyroidism 01/17/2015   Reactive  airways dysfunction syndrome, unspecified asthma severity, uncomplicated ([place]) 4/58/0998   S/P lobectomy of lung 07/26/2015   Formatting of this note might be different from the original. Right upper   S/P lumbar spinal fusion 05/11/2018   Thyroid disease    Toe ulcer, left, limited to breakdown of skin ([place]) 12/23/2020    ALLERGIES:  is allergic to dextromethorphan hbr, keflex [cephalexin], robaxin [methocarbamol], bactrim [sulfamethoxazole-trimethoprim], hydroxyzine, prednisone & diphenhydramine, amlodipine, ciprofloxacin, dextromethorphan polistirex er, nitrofurantoin, diphenhydramine hcl, moxifloxacin, and prednisone.  MEDICATIONS:  Current Outpatient Medications  Medication Sig Dispense Refill   ALPRAZolam (XANAX) 0.5 MG tablet TAKE 1 TABLET(0.5 MG) BY MOUTH TWICE DAILY AS NEEDED FOR ANXIETY 60 tablet 3   buPROPion (WELLBUTRIN XL) 150 MG 24 hr tablet Take 1 tablet (150 mg total) by mouth daily. 90 tablet 2   calcium carbonate (TUMS - DOSED IN MG ELEMENTAL CALCIUM) 500 MG chewable tablet Chew 1 tablet by mouth 2 (two) times daily as needed for indigestion or heartburn.     D-Mannose 500 MG CAPS Take 500 mg by mouth 2 (two) times daily. (Patient not taking: Reported on 01/25/2022)     fluticasone (FLONASE) 50 MCG/ACT nasal spray Place 2 sprays into both nostrils daily. 16 g 6   ibuprofen (ADVIL,MOTRIN) 200 MG tablet Take 600 mg by mouth 2 (two) times daily as needed for mild pain.     levothyroxine (SYNTHROID) 137 MCG tablet TAKE 1 TABLET (137 MCG TOTAL) BY MOUTH DAILY. 90 tablet 2   losartan (COZAAR) 100 MG tablet Take 1 tablet (100 mg total) by mouth daily. 1 tablet 0   meloxicam (MOBIC) 15 MG tablet TAKE  1 TABLET EVERY DAY 90 tablet 1   metroNIDAZOLE (METROGEL) 0.75 % gel Apply 1 application topically 2 (two) times daily. 45 g 3   omeprazole (PRILOSEC) 40 MG capsule TAKE 1 CAPSULE ONE TIME DAILY BEFORE A MEAL 90 capsule 2   pregabalin (LYRICA) 150 MG capsule TAKE 1 CAPSULE(150 MG) BY MOUTH  TWICE DAILY 60 capsule 5   Spacer/Aero-Holding Chambers (AEROCHAMBER MAX W/FLOW-VU) MISC Use as directed. 1 each 0   traMADol (ULTRAM) 50 MG tablet TAKE 1 TABLET(50 MG) BY MOUTH FOUR TIMES DAILY 120 tablet 2   No current facility-administered medications for this visit.    SURGICAL HISTORY:  Past Surgical History:  Procedure Laterality Date   ANKLE SURGERY     bilateral   APPENDECTOMY     BREAST BIOPSY Left 02/25/2020   BREAST BIOPSY Right 08/17/2018   BREAST EXCISIONAL BIOPSY Right    unsure when but marked with scar marker   CHOLECYSTECTOMY     REPLACEMENT TOTAL KNEE Left    THYROIDECTOMY      REVIEW OF SYSTEMS:   Review of Systems  Constitutional: Negative for appetite change, chills, fatigue, fever and unexpected weight change.  HENT:   Negative for mouth sores, nosebleeds, sore throat and trouble swallowing.   Eyes: Negative for eye problems and icterus.  Respiratory: Negative for cough, hemoptysis, shortness of breath and wheezing.   Cardiovascular: Negative for chest pain and leg swelling.  Gastrointestinal: Negative for abdominal pain, constipation, diarrhea, nausea and vomiting.  Genitourinary: Negative for bladder incontinence, difficulty urinating, dysuria, frequency and hematuria.   Musculoskeletal: Negative for back pain, gait problem, neck pain and neck stiffness.  Skin: Negative for itching and rash.  Neurological: Negative for dizziness, extremity weakness, gait problem, headaches, light-headedness and seizures.  Hematological: Negative for adenopathy. Does not bruise/bleed easily.  Psychiatric/Behavioral: Negative for confusion, depression and sleep disturbance. The patient is not nervous/anxious.     PHYSICAL EXAMINATION:  There were no vitals taken for this visit.  ECOG PERFORMANCE STATUS: {CHL ONC ECOG [id number]  Physical Exam  Constitutional: Oriented to person, place, and time and well-developed, well-nourished, and in no distress. No distress.   HENT:  Head: Normocephalic and atraumatic.  Mouth/Throat: Oropharynx is clear and moist. No oropharyngeal exudate.  Eyes: Conjunctivae are normal. Right eye exhibits no discharge. Left eye exhibits no discharge. No scleral icterus.  Neck: Normal range of motion. Neck supple.  Cardiovascular: Normal rate, regular rhythm, normal heart sounds and intact distal pulses.   Pulmonary/Chest: Effort normal and breath sounds normal. No respiratory distress. No wheezes. No rales.  Abdominal: Soft. Bowel sounds are normal. Exhibits no distension and no mass. There is no tenderness.  Musculoskeletal: Normal range of motion. Exhibits no edema.  Lymphadenopathy:    No cervical adenopathy.  Neurological: Alert and oriented to person, place, and time. Exhibits normal muscle tone. Gait normal. Coordination normal.  Skin: Skin is warm and dry. No rash noted. Not diaphoretic. No erythema. No pallor.  Psychiatric: Mood, memory and judgment normal.  Vitals reviewed.  LABORATORY DATA: Lab Results  Component Value Date   WBC 5.0 01/25/2022   HGB 14.4 01/25/2022   HCT 41.8 01/25/2022   MCV 86 01/25/2022   PLT 283 01/25/2022      Chemistry      Component Value Date/Time   NA 138 01/25/2022 1410   NA 140 11/22/2017 1451   K 4.9 01/25/2022 1410   K 3.9 11/22/2017 1451   CL 102 01/25/2022 1410   CO2  24 01/25/2022 1410   CO2 26 11/22/2017 1451   BUN 20 01/25/2022 1410   BUN 14.5 11/22/2017 1451   CREATININE 0.69 01/25/2022 1410   CREATININE 0.74 01/23/2021 1453   CREATININE 0.8 11/22/2017 1451      Component Value Date/Time   CALCIUM 9.8 01/25/2022 1410   CALCIUM 9.1 11/22/2017 1451   ALKPHOS 175 (H) 01/25/2022 1410   ALKPHOS 122 11/22/2017 1451   AST 15 01/25/2022 1410   AST 13 (L) 01/23/2021 1453   AST 13 11/22/2017 1451   ALT 15 01/25/2022 1410   ALT 12 01/23/2021 1453   ALT 13 11/22/2017 1451   BILITOT 0.4 01/25/2022 1410   BILITOT 0.4 01/23/2021 1453   BILITOT 0.48 11/22/2017 1451        RADIOGRAPHIC STUDIES:  CT Chest W Contrast  Result Date: 01/29/2022 CLINICAL DATA:  Primary Cancer Type: Lung Imaging Indication: Routine surveillance Interval therapy since last imaging? No Initial Cancer Diagnosis Date: 06/2015; Established by: Biopsy-proven Detailed Pathology: Stage IB non-small cell lung cancer, adenocarcinoma. Primary Tumor location: Right upper lobe. Surgeries: Right upper lobectomy 07/22/2015. Thyroidectomy, cholecystectomy, appendectomy. Chemotherapy: Yes; Ongoing?  No; Most recent administration: 10/2015 Immunotherapy? No Radiation therapy? No EXAM: CT CHEST WITH CONTRAST TECHNIQUE: Multidetector CT imaging of the chest was performed during intravenous contrast administration. RADIATION DOSE REDUCTION: This exam was performed according to the departmental dose-optimization program which includes automated exposure control, adjustment of the mA and/or kV according to patient size and/or use of iterative reconstruction technique. CONTRAST:  58mL OMNIPAQUE IOHEXOL 300 MG/ML  SOLN COMPARISON:  Most recent CT chest 06/10/2021. FINDINGS: Cardiovascular: Normal heart size. 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 No pericardial effusion. Three-vessel coronary atherosclerosis. Atherosclerotic nonaneurysmal thoracic aorta. Normal caliber pulmonary arteries. No central pulmonary emboli. Mediastinum/Nodes: Status post total thyroidectomy. Unremarkable esophagus. No pathologically enlarged axillary, mediastinal or hilar lymph nodes. Lungs/Pleura: No pneumothorax. No pleural effusion. Status post right upper lobectomy. Mild centrilobular emphysema. No acute consolidative airspace disease, lung masses or significant pulmonary nodules. Mild patchy subpleural reticulation and ground-glass opacity at both lung bases without significant regions of  traction bronchiectasis or frank honeycombing, mildly increased from prior. Upper abdomen: Simple exophytic 1.3 cm posterior upper left renal cyst. Musculoskeletal: No aggressive appearing focal osseous lesions. Marked thoracic spondylosis. IMPRESSION: 1. Status post right upper lobectomy. No evidence of local tumor recurrence. 2. No evidence of metastatic disease in the chest. 3. Mild patchy subpleural reticulation and ground-glass opacity at both lung bases without significant traction bronchiectasis or frank honeycombing, mildly increased from prior, cannot exclude an interstitial lung disease such as nonspecific interstitial pneumonia (NSIP) or usual interstitial pneumonia (UIP). Consider pulmonology consultation and follow-up high-resolution chest CT study, as clinically warranted. 4. Three-vessel coronary atherosclerosis. 5. 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 6. Aortic Atherosclerosis (ICD10-I70.0) and Emphysema (ICD10-J43.9). Electronically Signed   By: Ilona Sorrel M.D.   On: 01/29/2022 12:53     ASSESSMENT/PLAN:  This is a very pleasant 71 years old white female with a stage IB non-small cell lung cancer, adenocarcinoma status post right upper lobectomy with lymph node dissection followed by 4 cycles of adjuvant systemic chemotherapy with cisplatin and Alimta.  The patient is currently on observation since November 2016.  She is feeling fine with no concerning complaints.  The patient recently had a restaging CT scan performed. Dr. Julien Nordmann personally and independently  reviewed the scan and discussed the results with the patient. The scan showed ***.   Dr. Julien Nordmann recommends that she *** ob observation with a repeat CT scan of the chest in 1 year. Release to PCP???  The patient was advised to call immediately if she has any concerning symptoms in the interval. The patient voices understanding of current disease status and treatment options and is in  agreement with the current care plan. All questions were answered. The patient knows to call the clinic with any problems, questions or concerns. We can certainly see the patient much sooner if necessary     No orders of the defined types were placed in this encounter.    I spent {CHL ONC TIME VISIT - CZYSA:[id number]} counseling the patient face to face. The total time spent in the appointment was {CHL ONC TIME VISIT - ATFTD:[id number]}.  Kiefer Opheim L Hennessy Bartel, PA-C 02/18/22

## 2022-02-19 ENCOUNTER — Other Ambulatory Visit: Payer: Self-pay

## 2022-02-19 ENCOUNTER — Ambulatory Visit (INDEPENDENT_AMBULATORY_CARE_PROVIDER_SITE_OTHER): Payer: Medicare HMO | Admitting: Legal Medicine

## 2022-02-19 ENCOUNTER — Encounter: Payer: Self-pay | Admitting: Legal Medicine

## 2022-02-19 VITALS — BP 140/82 | HR 81 | Temp 97.9°F | Resp 15 | Ht 64.0 in | Wt 188.0 lb

## 2022-02-19 DIAGNOSIS — H5789 Other specified disorders of eye and adnexa: Secondary | ICD-10-CM

## 2022-02-19 DIAGNOSIS — Z8639 Personal history of other endocrine, nutritional and metabolic disease: Secondary | ICD-10-CM

## 2022-02-19 DIAGNOSIS — N3090 Cystitis, unspecified without hematuria: Secondary | ICD-10-CM

## 2022-02-19 DIAGNOSIS — C3491 Malignant neoplasm of unspecified part of right bronchus or lung: Secondary | ICD-10-CM

## 2022-02-19 LAB — POCT URINALYSIS DIP (CLINITEK)
Blood, UA: NEGATIVE
Glucose, UA: NEGATIVE mg/dL
Ketones, POC UA: NEGATIVE mg/dL
Nitrite, UA: NEGATIVE
Spec Grav, UA: >=1.03 — AB (ref 1.010–1.025)
Urobilinogen, UA: 0.2 E.U./dL
pH, UA: 5.5 (ref 5.0–8.0)

## 2022-02-19 MED ORDER — AMOXICILLIN 875 MG PO TABS: 875.0000 mg | ORAL_TABLET | Freq: Two times a day (BID) | ORAL | 0 refills | Status: AC

## 2022-02-19 NOTE — Progress Notes (Signed)
Acute Office Visit  Subjective:    Patient ID: Victoria Pena, female    DOB: 1951/05/25, 71 y.o.   MRN: [id number]  Chief Complaint  Patient presents with   Urinary Tract Infection    HPI: Patient is in today for dysuria, frequency urination, suprapubic pain, tenesmo since 2 weeks ago. She is on D-mannose from urology.  Symptoms for 2 weeks with dysuria, frequency.  Past Medical History:  Diagnosis Date   Acquired spondylolisthesis 05/12/2015   Adenocarcinoma of right lung, stage 1 ([place]) 07/26/2016   Anxiety    Arthropathy of lumbar facet joint 04/11/2019   Formatting of this note might be different from the original. Added automatically from request for surgery [id number]   Atherosclerosis of aorta ([place]) 07/08/2021   Benign essential hypertension 12/27/2016   BMI 30.0-30.9,adult 12/23/2020   COPD (chronic obstructive pulmonary disease) ([place])    pt denies   Current mild episode of major depressive disorder ([place]) 03/24/2020   Daytime sleepiness 08/03/2021   Depression    Fibromyalgia 03/24/2020   Gastroesophageal reflux disease without esophagitis 03/24/2020   Graves disease 12/27/2016   Hypertension    Osteoporosis 08/03/2021   Pericardial cyst 02/02/2021   Postoperative hypothyroidism 01/17/2015   Reactive airways dysfunction syndrome, unspecified asthma severity, uncomplicated ([place]) 4/56/2563   S/P lobectomy of lung 07/26/2015   Formatting of this note might be different from the original. Right upper   S/P lumbar spinal fusion 05/11/2018   Thyroid disease    Toe ulcer, left, limited to breakdown of skin ([place]) 12/23/2020    Past Surgical History:  Procedure Laterality Date   ANKLE SURGERY     bilateral   APPENDECTOMY     BREAST BIOPSY Left 02/25/2020   BREAST BIOPSY Right 08/17/2018   BREAST EXCISIONAL BIOPSY Right    unsure when but marked with scar marker   CHOLECYSTECTOMY     REPLACEMENT TOTAL KNEE Left    THYROIDECTOMY      Family History  Problem Relation Age of Onset    Cancer Mother    Cancer Father    Cancer Brother    Autism Grandson     Social History   Socioeconomic History   Marital status: Widowed    Spouse name: Not on file   Number of children: Not on file   Years of education: Not on file   Highest education level: Not on file  Occupational History   Not on file  Tobacco Use   Smoking status: Former    Packs/day: 1.50    Years: 35.00    Pack years: 52.50    Types: Cigarettes    Quit date: 07/27/1999    Years since quitting: 22.5   Smokeless tobacco: Never  Vaping Use   Vaping Use: Never used  Substance and Sexual Activity   Alcohol use: No   Drug use: No   Sexual activity: Not Currently  Other Topics Concern   Not on file  Social History Narrative   Not on file   Social Determinants of Health   Financial Resource Strain: Not on file  Food Insecurity: Not on file  Transportation Needs: Not on file  Physical Activity: Not on file  Stress: Not on file  Social Connections: Not on file  Intimate Partner Violence: Not on file    Outpatient Medications Prior to Visit  Medication Sig Dispense Refill   ALPRAZolam (XANAX) 0.5 MG tablet TAKE 1 TABLET(0.5 MG) BY MOUTH TWICE DAILY AS NEEDED FOR ANXIETY  60 tablet 3   buPROPion (WELLBUTRIN XL) 150 MG 24 hr tablet Take 1 tablet (150 mg total) by mouth daily. 90 tablet 2   calcium carbonate (TUMS - DOSED IN MG ELEMENTAL CALCIUM) 500 MG chewable tablet Chew 1 tablet by mouth 2 (two) times daily as needed for indigestion or heartburn.     D-Mannose 500 MG CAPS Take 500 mg by mouth 2 (two) times daily.     fluticasone (FLONASE) 50 MCG/ACT nasal spray Place 2 sprays into both nostrils daily. 16 g 6   ibuprofen (ADVIL,MOTRIN) 200 MG tablet Take 600 mg by mouth 2 (two) times daily as needed for mild pain.     losartan (COZAAR) 100 MG tablet Take 1 tablet (100 mg total) by mouth daily. 1 tablet 0   meloxicam (MOBIC) 15 MG tablet TAKE 1 TABLET EVERY DAY 90 tablet 1   metroNIDAZOLE (METROGEL)  0.75 % gel Apply 1 application topically 2 (two) times daily. 45 g 3   omeprazole (PRILOSEC) 40 MG capsule TAKE 1 CAPSULE ONE TIME DAILY BEFORE A MEAL 90 capsule 2   pregabalin (LYRICA) 150 MG capsule TAKE 1 CAPSULE(150 MG) BY MOUTH TWICE DAILY 60 capsule 5   Spacer/Aero-Holding Chambers (AEROCHAMBER MAX W/FLOW-VU) MISC Use as directed. 1 each 0   traMADol (ULTRAM) 50 MG tablet TAKE 1 TABLET(50 MG) BY MOUTH FOUR TIMES DAILY 120 tablet 2   levothyroxine (SYNTHROID) 137 MCG tablet TAKE 1 TABLET (137 MCG TOTAL) BY MOUTH DAILY. 90 tablet 2   No facility-administered medications prior to visit.    Allergies  Allergen Reactions   Dextromethorphan Hbr Shortness Of Breath   Keflex [Cephalexin] Shortness Of Breath    Hard time breathing   Robaxin [Methocarbamol] Nausea Only    Stomach pain . Could not eat   Bactrim [Sulfamethoxazole-Trimethoprim] Itching   Hydroxyzine Other (See Comments) and Swelling    Numbness in lips   Prednisone & Diphenhydramine Other (See Comments)    Face flushed, heart racing   Amlodipine Other (See Comments)    Bp worsened. Shaky. Insomnia.   Ciprofloxacin    Dextromethorphan Polistirex Er    Nitrofurantoin    Diphenhydramine Hcl Palpitations   Moxifloxacin Rash   Prednisone Other (See Comments) and Palpitations    "flushing"     Review of Systems  Constitutional:  Negative for chills, fatigue and fever.  HENT:  Negative for congestion, ear pain and sore throat.   Respiratory:  Negative for cough and shortness of breath.   Cardiovascular:  Negative for chest pain and palpitations.  Gastrointestinal:  Positive for abdominal pain (suprapubic pain). Negative for constipation, diarrhea, nausea and vomiting.  Endocrine: Negative for polydipsia, polyphagia and polyuria.  Genitourinary:  Positive for difficulty urinating, dysuria and frequency.  Musculoskeletal:  Negative for arthralgias, back pain and myalgias.  Skin:  Negative for rash.  Neurological:   Negative for headaches.  Psychiatric/Behavioral:  Negative for dysphoric mood. The patient is not nervous/anxious.       Objective:    Physical Exam Vitals reviewed.  Constitutional:      General: She is in acute distress.     Appearance: Normal appearance.  HENT:     Mouth/Throat:     Mouth: Mucous membranes are moist.     Pharynx: Oropharynx is clear.  Eyes:     Extraocular Movements: Extraocular movements intact.     Conjunctiva/sclera: Conjunctivae normal.     Pupils: Pupils are equal, round, and reactive to light.     Comments:  proptosis  Cardiovascular:     Rate and Rhythm: Normal rate and regular rhythm.     Pulses: Normal pulses.     Heart sounds: No murmur heard.   No gallop.  Pulmonary:     Effort: Pulmonary effort is normal. No respiratory distress.     Breath sounds: No wheezing.  Abdominal:     General: Bowel sounds are normal. There is no distension.     Tenderness: There is no abdominal tenderness.  Musculoskeletal:        General: Normal range of motion.     Cervical back: Normal range of motion.     Right lower leg: No edema.     Left lower leg: No edema.  Skin:    General: Skin is warm.     Capillary Refill: Capillary refill takes less than 2 seconds.  Neurological:     Mental Status: She is alert and oriented to person, place, and time. Mental status is at baseline.    BP 140/82    Pulse 81    Temp 97.9 F (36.6 C)    Resp 15    Ht 5' 4" (1.626 m)    Wt 188 lb (85.3 kg)    LMP  (LMP Unknown)    SpO2 99%    BMI 32.27 kg/m  Wt Readings from Last 3 Encounters:  02/19/22 188 lb (85.3 kg)  01/25/22 185 lb (83.9 kg)  12/31/21 184 lb (83.5 kg)    Health Maintenance Due  Topic Date Due   Hepatitis C Screening  Never done   TETANUS/TDAP  Never done   Zoster Vaccines- Shingrix (1 of 2) Never done   COLONOSCOPY (Pts 45-47yr Insurance coverage will need to be confirmed)  Never done   COVID-19 Vaccine (3 - Moderna risk series) 03/10/2021   Pneumonia  Vaccine 71 Years old (2 - PCV) 09/23/2021    There are no preventive care reminders to display for this patient.   Lab Results  Component Value Date   TSH 0.340 (L) 02/19/2022   Lab Results  Component Value Date   WBC 5.0 01/25/2022   HGB 14.4 01/25/2022   HCT 41.8 01/25/2022   MCV 86 01/25/2022   PLT 283 01/25/2022   Lab Results  Component Value Date   NA 138 01/25/2022   K 4.9 01/25/2022   CHLORIDE 107 11/22/2017   CO2 24 01/25/2022   GLUCOSE 103 (H) 01/25/2022   BUN 20 01/25/2022   CREATININE 0.69 01/25/2022   BILITOT 0.4 01/25/2022   ALKPHOS 175 (H) 01/25/2022   AST 15 01/25/2022   ALT 15 01/25/2022   PROT 6.9 01/25/2022   ALBUMIN 4.4 01/25/2022   CALCIUM 9.8 01/25/2022   ANIONGAP 9 11/06/2021   EGFR 93 01/25/2022   Lab Results  Component Value Date   CHOL 206 (H) 01/25/2022   Lab Results  Component Value Date   HDL 85 01/25/2022   Lab Results  Component Value Date   LDLCALC 109 (H) 01/25/2022   Lab Results  Component Value Date   TRIG 69 01/25/2022   Lab Results  Component Value Date   CHOLHDL 2.4 01/25/2022   No results found for: HGBA1C     Assessment & Plan:   Problem List Items Addressed This Visit       Other   History of Graves' disease   Relevant Orders   TSH (Completed)   T3, Free (Completed)   T4, Free (Completed) Patient has history of graves disease and  on supplements, check lab and if needed cut levothyroxine dose    Other specified disorders of eye and adnexa Patient is being evaluated for proptosis and visual problems from graves disease   Other Visit Diagnoses     Cystitis    -  Primary   Relevant Medications   amoxicillin (AMOXIL) 875 MG tablet   azithromycin (ZITHROMAX) 250 MG tablet   Other Relevant Orders   POCT URINALYSIS DIP (CLINITEK) (Completed)   Urine Culture Start amoxicillin, she got nervous so stopped and zpack given until culture results      Meds ordered this encounter  Medications    amoxicillin (AMOXIL) 875 MG tablet    Sig: Take 1 tablet (875 mg total) by mouth 2 (two) times daily for 10 days.    Dispense:  20 tablet    Refill:  0   azithromycin (ZITHROMAX) 250 MG tablet    Sig: Take 2 tablets on day 1, then 1 tablet daily on days 2 through 5    Dispense:  6 tablet    Refill:  0    Orders Placed This Encounter  Procedures   Urine Culture   TSH   T3, Free   T4, Free   POCT URINALYSIS DIP (CLINITEK)     Follow-up: Return if symptoms worsen or fail to improve.  An After Visit Summary was printed and given to the patient.  Reinaldo Meeker, MD [facility] [phone]

## 2022-02-20 LAB — TSH: TSH: 0.34 u[IU]/mL — ABNORMAL LOW (ref 0.450–4.500)

## 2022-02-20 LAB — T3, FREE: T3, Free: 2.6 pg/mL (ref 2.0–4.4)

## 2022-02-20 LAB — T4, FREE: Free T4: 1.9 ng/dL — ABNORMAL HIGH (ref 0.82–1.77)

## 2022-02-20 MED ORDER — AZITHROMYCIN 250 MG PO TABS: ORAL_TABLET | ORAL | 0 refills | Status: AC

## 2022-02-21 ENCOUNTER — Encounter: Payer: Self-pay | Admitting: Legal Medicine

## 2022-02-21 ENCOUNTER — Other Ambulatory Visit: Payer: Self-pay | Admitting: Legal Medicine

## 2022-02-21 DIAGNOSIS — C3491 Malignant neoplasm of unspecified part of right bronchus or lung: Secondary | ICD-10-CM

## 2022-02-21 MED ORDER — LEVOTHYROXINE SODIUM 125 MCG PO TABS: 125.0000 ug | ORAL_TABLET | Freq: Every day | ORAL | 3 refills | Status: DC

## 2022-02-21 NOTE — Progress Notes (Signed)
TSH is now, may be on too much thyroid, free t4 high, free t3 high, cut thyroid to 159mcg and recheck 6 weeks ?lp

## 2022-02-22 ENCOUNTER — Telehealth: Payer: Self-pay | Admitting: Internal Medicine

## 2022-02-22 ENCOUNTER — Inpatient Hospital Stay: Payer: Medicare HMO

## 2022-02-22 ENCOUNTER — Inpatient Hospital Stay: Payer: Medicare HMO | Admitting: Physician Assistant

## 2022-02-22 LAB — URINE CULTURE

## 2022-02-22 NOTE — Telephone Encounter (Signed)
Cancelled per 3/4 pt request, called pt and lest a message stating to call back if they want to r/s ?

## 2022-02-22 NOTE — Progress Notes (Signed)
Urine culture negative ?lp

## 2022-02-23 ENCOUNTER — Other Ambulatory Visit: Payer: Self-pay

## 2022-02-23 DIAGNOSIS — I159 Secondary hypertension, unspecified: Secondary | ICD-10-CM

## 2022-02-23 MED ORDER — LOSARTAN POTASSIUM 100 MG PO TABS: 100.0000 mg | ORAL_TABLET | Freq: Every day | ORAL | 1 refills | Status: DC

## 2022-03-03 ENCOUNTER — Ambulatory Visit: Payer: Medicare HMO

## 2022-03-08 ENCOUNTER — Telehealth: Payer: Self-pay

## 2022-03-08 NOTE — Chronic Care Management (AMB) (Signed)
? ? ?Chronic Care Management ?Pharmacy Assistant  ? ?Name: Victoria Pena  MRN: [id number] DOB: May 23, 1951 ? ?Reason for Encounter: Disease State/ Hypertension ? ?Recent office visits:  ?02-19-2022 Lillard Anes, MD. Abnormal UA. Free T4= 1.90. TSH= 0.340. START amoxicillin 875 mg twice daily for 10 days and Zithromax 250 mg Take 2 tablets on day 1, then 1 tablet daily on days 2 through 5. ? ?Recent consult visits:  ?None ? ?Hospital visits:  ?None in previous 6 months ? ?Medications: ?Outpatient Encounter Medications as of 03/08/2022  ?Medication Sig  ? ALPRAZolam (XANAX) 0.5 MG tablet TAKE 1 TABLET(0.5 MG) BY MOUTH TWICE DAILY AS NEEDED FOR ANXIETY  ? buPROPion (WELLBUTRIN XL) 150 MG 24 hr tablet Take 1 tablet (150 mg total) by mouth daily.  ? calcium carbonate (TUMS - DOSED IN MG ELEMENTAL CALCIUM) 500 MG chewable tablet Chew 1 tablet by mouth 2 (two) times daily as needed for indigestion or heartburn.  ? D-Mannose 500 MG CAPS Take 500 mg by mouth 2 (two) times daily.  ? fluticasone (FLONASE) 50 MCG/ACT nasal spray Place 2 sprays into both nostrils daily.  ? ibuprofen (ADVIL,MOTRIN) 200 MG tablet Take 600 mg by mouth 2 (two) times daily as needed for mild pain.  ? levothyroxine (SYNTHROID) 125 MCG tablet Take 1 tablet (125 mcg total) by mouth daily.  ? losartan (COZAAR) 100 MG tablet Take 1 tablet (100 mg total) by mouth daily.  ? meloxicam (MOBIC) 15 MG tablet TAKE 1 TABLET EVERY DAY  ? metroNIDAZOLE (METROGEL) 0.75 % gel Apply 1 application topically 2 (two) times daily.  ? omeprazole (PRILOSEC) 40 MG capsule TAKE 1 CAPSULE ONE TIME DAILY BEFORE A MEAL  ? pregabalin (LYRICA) 150 MG capsule TAKE 1 CAPSULE(150 MG) BY MOUTH TWICE DAILY  ? Spacer/Aero-Holding Chambers (AEROCHAMBER MAX W/FLOW-VU) MISC Use as directed.  ? traMADol (ULTRAM) 50 MG tablet TAKE 1 TABLET(50 MG) BY MOUTH FOUR TIMES DAILY  ? ?No facility-administered encounter medications on file as of 03/08/2022.  ? ? ?Recent Office Vitals: ?BP  Readings from Last 3 Encounters:  ?02/19/22 140/82  ?01/25/22 120/84  ?12/31/21 133/87  ? ?Pulse Readings from Last 3 Encounters:  ?02/19/22 81  ?01/25/22 90  ?12/31/21 73  ?  ?Wt Readings from Last 3 Encounters:  ?02/19/22 188 lb (85.3 kg)  ?01/25/22 185 lb (83.9 kg)  ?12/31/21 184 lb (83.5 kg)  ?  ? ?Kidney Function ?Lab Results  ?Component Value Date/Time  ? CREATININE 0.69 01/25/2022 02:10 PM  ? CREATININE 0.67 11/06/2021 07:21 PM  ? CREATININE 0.74 01/23/2021 02:53 PM  ? CREATININE 0.77 01/25/2020 01:45 PM  ? CREATININE 0.8 11/22/2017 02:51 PM  ? CREATININE 0.8 05/24/2017 09:34 AM  ? GFRNONAA >60 11/06/2021 07:21 PM  ? GFRNONAA >60 01/23/2021 02:53 PM  ? GFRAA 106 12/23/2020 11:23 AM  ? GFRAA >60 01/25/2020 01:45 PM  ? ? ?BMP Latest Ref Rng & Units 01/25/2022 11/06/2021 09/16/2021  ?Glucose 70 - 99 mg/dL 103(H) 96 95  ?BUN 8 - 27 mg/dL 20 21 18   ?Creatinine 0.57 - 1.00 mg/dL 0.69 0.67 0.62  ?BUN/Creat Ratio 12 - 28 29(H) - 29(H)  ?Sodium 134 - 144 mmol/L 138 142 141  ?Potassium 3.5 - 5.2 mmol/L 4.9 3.8 4.3  ?Chloride 96 - 106 mmol/L 102 108 104  ?CO2 20 - 29 mmol/L 24 25 22   ?Calcium 8.7 - 10.3 mg/dL 9.8 9.9 9.1  ? ? ? ?Current antihypertensive regimen:  ?Losartan 100 mg daily ? Patient verbally confirms  she is taking the above medications as directed. Yes ? ?How often are you checking your Blood Pressure? 1-2x per week ? ?she checks her blood pressure in the morning after taking her medication. ? ?Current home BP readings: 130/70 72, 130/70 80 ?Wrist or arm cuff: wrist ?Caffeine intake: Patient stated very limited ?Salt intake: Limited ?OTC medications including pseudoephedrine or NSAIDs? Tylenol as needed, aloe vera when having urinary pain, d-mannose ? ?What recent interventions/DTPs have been made by any provider to improve Blood Pressure control since last CPP Visit:  ?Check BP as needed, document, and provide at future appointments ?Ensure daily salt intake < 2300 mg/day ? ?Any recent hospitalizations or ED  visits since last visit with CPP? No ? ?What diet changes have been made to improve Blood Pressure Control?  ?Patient stated she has limited her salt intake and drinks water daily with Crystal light added. ? ?What exercise is being done to improve your Blood Pressure Control?  ?Patient stated she is active daily around the house and running errands. ? ?Adherence Review: ?Is the patient currently on ACE/ARB medication? Yes ?Does the patient have >5 day gap between last estimated fill dates? CPP to review ? ?Care Gaps: ?Last annual wellness visit? None ?Tdap overdue ?Hep C screening overdue ?Shingrix overdue ?PNA vac overdue ?Colonoscopy overdue ? ?Star Rating Drugs: ?Losartan 100 mg- Last filled 01-15-2022 90 DS ? ?Malecca Hicks CMA ?Clinical Pharmacist Assistant ?[phone] ? ?

## 2022-03-15 ENCOUNTER — Other Ambulatory Visit: Payer: Self-pay | Admitting: Legal Medicine

## 2022-03-15 DIAGNOSIS — C3491 Malignant neoplasm of unspecified part of right bronchus or lung: Secondary | ICD-10-CM

## 2022-03-24 ENCOUNTER — Other Ambulatory Visit: Payer: Self-pay

## 2022-03-24 DIAGNOSIS — F064 Anxiety disorder due to known physiological condition: Secondary | ICD-10-CM

## 2022-03-24 DIAGNOSIS — M4310 Spondylolisthesis, site unspecified: Secondary | ICD-10-CM

## 2022-03-24 MED ORDER — ALPRAZOLAM 0.5 MG PO TABS: ORAL_TABLET | ORAL | 3 refills | Status: DC

## 2022-03-24 MED ORDER — TRAMADOL HCL 50 MG PO TABS: ORAL_TABLET | ORAL | 3 refills | Status: DC

## 2022-03-25 ENCOUNTER — Telehealth: Payer: Self-pay

## 2022-03-25 NOTE — Progress Notes (Signed)
Pt was reminded of appt with CPP on 4/11. ? ?Elray Mcgregor, CMA ?Clinical Pharmacist Assistant  ?[phone]  ?

## 2022-03-27 ENCOUNTER — Other Ambulatory Visit: Payer: Self-pay | Admitting: Legal Medicine

## 2022-03-27 DIAGNOSIS — F064 Anxiety disorder due to known physiological condition: Secondary | ICD-10-CM

## 2022-03-30 ENCOUNTER — Ambulatory Visit (INDEPENDENT_AMBULATORY_CARE_PROVIDER_SITE_OTHER): Payer: Medicare HMO

## 2022-03-30 ENCOUNTER — Other Ambulatory Visit: Payer: Self-pay | Admitting: Legal Medicine

## 2022-03-30 DIAGNOSIS — K219 Gastro-esophageal reflux disease without esophagitis: Secondary | ICD-10-CM

## 2022-03-30 DIAGNOSIS — Z8639 Personal history of other endocrine, nutritional and metabolic disease: Secondary | ICD-10-CM

## 2022-03-30 DIAGNOSIS — C3491 Malignant neoplasm of unspecified part of right bronchus or lung: Secondary | ICD-10-CM

## 2022-03-30 DIAGNOSIS — I10 Essential (primary) hypertension: Secondary | ICD-10-CM

## 2022-03-30 MED ORDER — BUPROPION HCL ER (XL) 300 MG PO TB24: 300.0000 mg | ORAL_TABLET | Freq: Every day | ORAL | 2 refills | Status: DC

## 2022-03-30 NOTE — Patient Instructions (Signed)
Visit Information ? ? Goals Addressed   ?None ?  ? ?Patient Care Plan: Victoria Pena  ?  ? ?Problem Identified: Disease State Management   ?Priority: High  ?Onset Date: 03/30/2022  ?  ? ?Long-Range Goal: Chronic Pain, HTN, GERD   ?Start Date: 03/30/2022  ?Expected End Date: 03/30/2023  ?This Visit's Progress: On track  ?Priority: High  ?Note:   ?Current Barriers:  ?Does not contact provider office for questions/concerns ? ?Pharmacist Clinical Goal(s):  ?Patient will verbalize ability to afford treatment regimen through collaboration with PharmD and provider.  ? ?Interventions: ?1:1 collaboration with Lillard Anes, MD regarding development and update of comprehensive plan of care as evidenced by provider attestation and co-signature ?Inter-disciplinary care team collaboration (see longitudinal plan of care) ?Comprehensive medication review performed; medication list updated in electronic medical record ? 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 ?Depression/Anxiety (Goal: Improve mood) ?-Not ideally controlled ?-Current treatment: ?Bupropion 150mg  XL Appropriate, Query effective, Safe, Accessible ?Alprazolam 0.5mg  PRN Appropriate, Query effective, Safe, Accessible ?-Medications previously tried/failed:  Escitalopram ?-PHQ9:  ? ?  08/10/2021  ?  2:36 PM 06/30/2021  ?  9:55 AM 06/30/2021  ?  9:52 AM  ?Depression screen PHQ 2/9  ?Decreased Interest 0 0 0  ?Down, Depressed, Hopeless 0 0 0  ?PHQ - 2 Score 0 0 0  ?Altered sleeping  1 2  ?Tired, decreased energy  0 0  ?Change in appetite  0 0  ?Feeling bad or failure about yourself   0 0  ?Trouble concentrating  1 1  ?Moving slowly or fidgety/restless  0 0  ?Suicidal thoughts  0 0  ?PHQ-9 Score  2 3  ?Difficult doing work/chores  Somewhat difficult Somewhat difficult  ?-GAD7:  ? ?  03/24/2020  ?  2:03 PM  ?GAD 7 : Generalized Anxiety Score  ?Nervous, Anxious, on Edge 2  ?Control/stop worrying 1  ?Worry too much - different things 1  ?Trouble relaxing 2  ?Restless 2  ?Easily annoyed or irritable 2  ?Afraid - awful might happen 2  ?Total GAD 7 Score 12  ?Anxiety Difficulty Very difficult  ?-Educated on Benefits of medication for symptom control ?April 2023: Wanted to do PHQ9/GAD7 today but patient was babysitting 3 grandkids and kept being interrupted. Will try at future visit to do scores. However, patient stated multiple times, "I'm just tired all the time" and states she is not content with therapy. Was on Bupropion 300mg  in the past but was decreased due to addition of Hydroxyzine. She would like to try Bupropion 300mg  again, will ask PCP ? ?Thyroid (Grave's Disease) (Goal TSH: 0.4-5.0) ?Lab Results  ?Component Value Date  ? TSH 0.340 (L) 02/19/2022  ?-Uncontrolled ?-Current treatment: ?Levothyroxine 122mcg Appropriate, Effective, Safe, Accessible ?-Counseled to take medication on an empty stomach ?-Recommended to continue current medication ? ? ?GERD (Goal: minimize symptoms of reflux ) ?-Controlled ?-Current treatment  ?Omeprazole 40mg   Appropriate, Effective, Safe, Accessible ?-Medications previously tried: none reported  ?April 2023: Will go over more in depth at future visit. Main priority was mood ? ?Chronic Pain ?-Not ideally controlled ?-Current treatment: ?Meloxicam  15mg  PRN Appropriate, Query effective, Safe, Accessible ?Pregabalin 150mg  Appropriate, Query effective, Safe, Accessible ?Tramadol 50mg  Appropriate, Query effective, Safe, Accessible ?-Medications previously tried: IBU (No longer taking per patient) ?-Pain Scale ?There were no vitals filed for this visit.  ?Aggravating Factors: Movement  ?Pain Type: Chronic  ?April 2023: Unable to get pain scale today. Patient babysitting multiple grandkids and we kept getting interrupted. However, she did state she was not satisfied with pain management. Main priority was mood, will look into pain at subsequent visits ? ? ?Patient Goals/Self-Care Activities ?Patient will:  ?- take medications as prescribed as evidenced by patient report and record review ? ?Follow Up Plan: The patient has been provided with contact information for the care management team and has been advised to call with any health related questions or concerns.  ? ?CPP F/U June 2023 ? ?[place] Constable, Pharm.D. - [phone] ? ? ?  ? ? ?Ms. Gallentine was given information about Chronic Care Management services today including:  ?CCM service includes personalized support from designated clinical staff supervised by her physician, including individualized plan of care and coordination with other care providers ?24/7 contact phone numbers for assistance for urgent and routine care needs. ?Standard insurance, coinsurance, copays and deductibles apply for chronic care management only during months in which we provide at least 20 minutes of these services. Most insurances cover these services at 100%, however patients may be responsible for any copay, coinsurance and/or deductible if applicable. This service may help you avoid the need for more expensive face-to-face services. ?Only one practitioner may furnish and bill the service in a calendar month. ?The patient may stop CCM services at any time (effective at the end of the month) by phone call to the office  staff. ? ?Patient agreed to services and verbal consent obtained.  ? ?The patient verbalized understanding of instructions, educational materials, and care plan provided today and declined offer to receive copy of patient instructions, educational materials, and care plan.  ?The pharmacy team will reach out to the patient again over the next 60 days.  ? ?Lane Hacker, [place] ? ?

## 2022-03-30 NOTE — Progress Notes (Signed)
? ?Chronic Care Management ?Pharmacy Note ? ?03/30/2022 ?Name:  Victoria Pena MRN:  [id number] DOB:  04-Sep-1951 ? ?Summary: ?-Pleasant 71 year old female presents for f/u CCM visit. 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 During the week she babysits her 71 year old autistic grandson and 68 year old granddaughter. She has a Hx of Fibromyalgia, 2 knee replacements, 2 ankle Sx, spinal fusion, Lung cancer, and Grave's Disease. She is a widow and lives by herself, takes pride in being very independent. She left home at 71 years of age and was married 3x. Her husband passed 9 years ago (2023). She is currently taking an 11 month course for medical coding and on month 2 (as of April 2023). ?-Was on food stamps but is over the cut-off by $200. She wants to retire but has to work until her car is paid off in 2026 ? ?Recommendations/Changes made from today's visit: ?-Patient would like to try Bupropion 380m, will let PCP know ? ? ? ? ?Subjective: ?Victoria Yatesis an 71y.o. year old female who is a primary patient of PHenrene Pastor Victoria Comfort MD.  The CCM team was consulted for assistance with disease management and care coordination needs.   ? ?Engaged with patient by telephone for follow up visit in response to provider referral for pharmacy case management and/or care coordination services.  ? ?Consent to Services:  ?The patient was given the following information about Chronic Care Management services today, agreed to services, and gave verbal consent: 1. CCM service includes personalized support from designated clinical staff supervised by the primary care provider, including individualized plan of care and coordination with other care providers 2. 24/7 contact phone numbers for assistance for urgent and routine care needs. 3. Service will only be billed when office clinical staff spend 20 minutes or more in a month to coordinate care. 4. Only one  practitioner may furnish and bill the service in a calendar month. 5.The patient may stop CCM services at any time (effective at the end of the month) by phone call to the office staff. 6. The patient will be responsible for cost sharing (co-pay) of up to 20% of the service fee (after annual deductible is met). Patient agreed to services and consent obtained. ? ?Patient Care Team: ?Victoria Anes MD as PCP - General (Family Medicine) ? ?Recent office visits:  ?02-19-2022 Victoria Anes MD. Abnormal UA. Free T4= 1.90. TSH= 0.340. START amoxicillin 875 mg twice daily for 10 days and Zithromax 250 mg Take 2 tablets on day 1, then 1 tablet daily on days 2 through 5. ?  ?Recent consult visits:  ?None ?  ?Hospital visits:  ?None in previous 6 months ? ? ?Objective: ? ?Lab Results  ?Component Value Date  ? CREATININE 0.69 01/25/2022  ? BUN 20 01/25/2022  ? EGFR 93 01/25/2022  ? GFRNONAA >60 11/06/2021  ? GFRAA 106 12/23/2020  ? NA 138 01/25/2022  ? K 4.9 01/25/2022  ? CALCIUM 9.8 01/25/2022  ? CO2 24 01/25/2022  ? GLUCOSE 103 (H) 01/25/2022  ? ? ?No results found for: HGBA1C, FRUCTOSAMINE, GFR, MICROALBUR  ?Last diabetic Eye exam: No results found for: HMDIABEYEEXA  ?Last diabetic Foot exam: No results found for: HMDIABFOOTEX  ? ?Lab Results  ?Component Value Date  ? CHOL 206 (H) 01/25/2022  ? HDL 85 01/25/2022  ? LDLCALC 109 (H) 01/25/2022  ? TRIG 69  01/25/2022  ? CHOLHDL 2.4 01/25/2022  ? ? ? ?  Latest Ref Rng & Units 01/25/2022  ?  2:10 PM 11/06/2021  ?  7:21 PM 07/08/2021  ?  2:36 PM  ?Hepatic Function  ?Total Protein 6.0 - 8.5 g/dL 6.9   7.3   6.2    ?Albumin 3.7 - 4.7 g/dL 4.4   4.3   4.1    ?AST 0 - 40 IU/L _0 ?ALT 0 - 32 IU/L _1 ?Alk Phosphatase 44 - 121 IU/L 175   113   134    ?Total Bilirubin 0.0 - 1.2 mg/dL 0.4   0.6   0.4    ? ? ?Lab Results  ?Component Value Date/Time  ? TSH 0.340 (L) 02/19/2022 12:01 PM  ? TSH 0.945 01/25/2022 02:21 PM  ? FREET4 1.90 (H) 02/19/2022  12:01 PM  ? FREET4 1.41 03/24/2020 11:24 AM  ? ? ? ?  Latest Ref Rng & Units 01/25/2022  ?  2:10 PM 11/06/2021  ?  7:21 PM 09/16/2021  ?  2:15 PM  ?CBC  ?WBC 3.4 - 10.8 x10E3/uL 5.0   6.5   4.5    ?Hemoglobin 11.1 - 15.9 g/dL 14.4   14.9   13.7    ?Hematocrit 34.0 - 46.6 % 41.8   46.9   42.3    ?Platelets 150 - 450 x10E3/uL 283   264   235    ? ? ?Lab Results  ?Component Value Date/Time  ? VD25OH 22.3 (L) 01/25/2022 02:10 PM  ? ? ?Clinical ASCVD: Yes  ?The 10-year ASCVD risk score (Arnett DK, et al., 2019) is: 15.6% ?  Values used to calculate the score: ?    Age: 42 years ?    Sex: Female ?    Is Non-Hispanic African American: No ?    Diabetic: No ?    Tobacco smoker: No ?    Systolic Blood Pressure: 373 mmHg ?    Is BP treated: Yes ?    HDL Cholesterol: 85 mg/dL ?    Total Cholesterol: 206 mg/dL   ? ? ?  08/10/2021  ?  2:36 PM 06/30/2021  ?  9:55 AM 06/30/2021  ?  9:52 AM  ?Depression screen PHQ 2/9  ?Decreased Interest 0 0 0  ?Down, Depressed, Hopeless 0 0 0  ?PHQ - 2 Score 0 0 0  ?Altered sleeping  1 2  ?Tired, decreased energy  0 0  ?Change in appetite  0 0  ?Feeling bad or failure about yourself   0 0  ?Trouble concentrating  1 1  ?Moving slowly or fidgety/restless  0 0  ?Suicidal thoughts  0 0  ?PHQ-9 Score  2 3  ?Difficult doing work/chores  Somewhat difficult Somewhat difficult  ?  ? ?Other: (CHADS2VASc if Afib, MMRC or CAT for COPD, ACT, DEXA) ? ?Social History  ? ?Tobacco Use  ?Smoking Status Former  ? Packs/day: 1.50  ? Years: 35.00  ? Pack years: 52.50  ? Types: Cigarettes  ? Quit date: 07/27/1999  ? Years since quitting: 22.6  ?Smokeless Tobacco Never  ? ?BP Readings from Last 3 Encounters:  ?02/19/22 140/82  ?01/25/22 120/84  ?12/31/21 133/87  ? ?Pulse Readings from Last 3 Encounters:  ?02/19/22 81  ?01/25/22 90  ?12/31/21 73  ? ?Wt Readings from Last 3 Encounters:  ?02/19/22 188 lb (85.3 kg)  ?01/25/22 185 lb (  83.9 kg)  ?12/31/21 184 lb (83.5 kg)  ? ?BMI Readings from Last 3 Encounters:  ?02/19/22 32.27  kg/m?  ?01/25/22 31.76 kg/m?  ?12/31/21 31.58 kg/m?  ? ? ?Assessment/Interventions: Review of patient past medical history, allergies, medications, health status, including review of consultants reports, laboratory and other test data, was performed as part of comprehensive evaluation and provision of chronic care management services.  ? ?SDOH:  (Social Determinants of Health) assessments and interventions performed: Yes ?SDOH Interventions   ? ?Flowsheet Row Most Recent Value  ?SDOH Interventions   ?Financial Strain Interventions Intervention Not Indicated  ?Transportation Interventions Intervention Not Indicated  ? ?  ? ?SDOH Screenings  ? ?Alcohol Screen: Low Risk   ? Last Alcohol Screening Score (AUDIT): 0  ?Depression (PHQ2-9): Low Risk   ? PHQ-2 Score: 0  ?Financial Resource Strain: Low Risk   ? Difficulty of Paying Living Expenses: Not hard at all  ?Food Insecurity: Not on file  ?Housing: Not on file  ?Physical Activity: Not on file  ?Social Connections: Not on file  ?Stress: Not on file  ?Tobacco Use: Medium Risk  ? Smoking Tobacco Use: Former  ? Smokeless Tobacco Use: Never  ? Passive Exposure: Not on file  ?Transportation Needs: No Transportation Needs  ? Lack of Transportation (Medical): No  ? Lack of Transportation (Non-Medical): No  ? ? ?CCM Care Plan ? ?Allergies  ?Allergen Reactions  ? Dextromethorphan Hbr Shortness Of Breath  ? Keflex [Cephalexin] Shortness Of Breath  ?  Hard time breathing  ? Robaxin [Methocarbamol] Nausea Only  ?  Stomach pain . Could not eat  ? Bactrim [Sulfamethoxazole-Trimethoprim] Itching  ? Hydroxyzine Other (See Comments) and Swelling  ?  Numbness in lips  ? Prednisone & Diphenhydramine Other (See Comments)  ?  Face flushed, heart racing  ? Amlodipine Other (See Comments)  ?  Bp worsened. ?Shaky. Insomnia.  ? Ciprofloxacin   ? Dextromethorphan Polistirex Er   ? Nitrofurantoin   ? Diphenhydramine Hcl Palpitations  ? Moxifloxacin Rash  ? Prednisone Other (See Comments) and  Palpitations  ?  "flushing" ?  ? ? ?Medications Reviewed Today   ? ? Reviewed by Lane Hacker, [facility] (Pharmacist) on 03/30/22 at 1442  Med List Status: <None>  ? ?Medication Order Taking? Sig Documenting Provider L

## 2022-04-07 NOTE — Progress Notes (Signed)
Patient got Bupropion 300mg  dose and has been taking 1 week with no issues. Will f/u in 2 months ?

## 2022-04-13 ENCOUNTER — Telehealth: Payer: Self-pay

## 2022-04-13 NOTE — Telephone Encounter (Signed)
Decrease dose back to 150 for now ?lp ?

## 2022-04-13 NOTE — Telephone Encounter (Signed)
Patient was informed.

## 2022-04-13 NOTE — Telephone Encounter (Signed)
[name] called with questions about the wellbutrin XL.  Her dosage was increased to 150 mg 2 daily on 03/30/2022.  She had been doing very well but over the last 2-3 days, she has developed shakes and tremors of her hands.  She then took xanax to calm her down.  She is questioning whether she should continue the higher dose.   ?

## 2022-04-14 ENCOUNTER — Other Ambulatory Visit: Payer: Medicare HMO

## 2022-04-18 DIAGNOSIS — F32A Depression, unspecified: Secondary | ICD-10-CM

## 2022-04-18 DIAGNOSIS — I10 Essential (primary) hypertension: Secondary | ICD-10-CM | POA: Diagnosis not present

## 2022-04-27 ENCOUNTER — Ambulatory Visit (INDEPENDENT_AMBULATORY_CARE_PROVIDER_SITE_OTHER): Payer: Medicare HMO

## 2022-04-27 VITALS — BP 122/79 | HR 82

## 2022-04-27 DIAGNOSIS — Z1211 Encounter for screening for malignant neoplasm of colon: Secondary | ICD-10-CM | POA: Diagnosis not present

## 2022-04-27 DIAGNOSIS — Z0000 Encounter for general adult medical examination without abnormal findings: Secondary | ICD-10-CM | POA: Diagnosis not present

## 2022-04-27 NOTE — Progress Notes (Signed)
? ?Subjective:  ? Victoria Pena is a 71 y.o. female who presents for Medicare Annual (Subsequent) preventive examination. ? ?I connected with  Anshi Jalloh Tritschler on 04/27/22 by a audio enabled telemedicine application and verified that I am speaking with the correct person using two identifiers. ? ?Patient Location: Home ? ?Provider Location: Home Office ? ?I discussed the limitations of evaluation and management by telemedicine. The patient expressed understanding and agreed to proceed.  ?Review of Systems    ? ?  ? ?   ?Objective:  ?  ?There were no vitals filed for this visit. ?There is no height or weight on file to calculate BMI. ? ? ?  11/06/2021  ?  2:30 PM 01/25/2019  ?  9:03 AM 05/11/2018  ?  4:20 PM 05/03/2018  ?  9:41 AM 11/23/2017  ? 10:37 AM 05/26/2017  ? 12:00 PM  ?Advanced Directives  ?Does Patient Have a Medical Advance Directive? No No No No No No  ?Would patient like information on creating a medical advance directive?   No - Patient declined Yes (MAU/Ambulatory/Procedural Areas - Information given)    ? ? ?Current Medications (verified) ?Outpatient Encounter Medications as of 04/27/2022  ?Medication Sig  ? ALPRAZolam (XANAX) 0.5 MG tablet TAKE 1 TABLET(0.5 MG) BY MOUTH TWICE DAILY AS NEEDED FOR ANXIETY  ? buPROPion (WELLBUTRIN XL) 300 MG 24 hr tablet Take 1 tablet (300 mg total) by mouth daily.  ? calcium carbonate (TUMS - DOSED IN MG ELEMENTAL CALCIUM) 500 MG chewable tablet Chew 1 tablet by mouth 2 (two) times daily as needed for indigestion or heartburn.  ? D-Mannose 500 MG CAPS Take 500 mg by mouth 2 (two) times daily.  ? fluticasone (FLONASE) 50 MCG/ACT nasal spray Place 2 sprays into both nostrils daily.  ? ibuprofen (ADVIL,MOTRIN) 200 MG tablet Take 600 mg by mouth 2 (two) times daily as needed for mild pain.  ? levothyroxine (SYNTHROID) 125 MCG tablet Take 1 tablet (125 mcg total) by mouth daily.  ? losartan (COZAAR) 100 MG tablet Take 1 tablet (100 mg total) by mouth daily.  ? meloxicam  (MOBIC) 15 MG tablet TAKE 1 TABLET EVERY DAY  ? metroNIDAZOLE (METROGEL) 0.75 % gel Apply 1 application topically 2 (two) times daily.  ? omeprazole (PRILOSEC) 40 MG capsule TAKE 1 CAPSULE ONE TIME DAILY BEFORE A MEAL  ? pregabalin (LYRICA) 150 MG capsule TAKE 1 CAPSULE(150 MG) BY MOUTH TWICE DAILY  ? Spacer/Aero-Holding Chambers (AEROCHAMBER MAX W/FLOW-VU) MISC Use as directed.  ? traMADol (ULTRAM) 50 MG tablet TAKE 1 TABLET(50 MG) BY MOUTH FOUR TIMES DAILY  ? ?No facility-administered encounter medications on file as of 04/27/2022.  ? ? ?Allergies (verified) ?Dextromethorphan hbr, Keflex [cephalexin], Robaxin [methocarbamol], Bactrim [sulfamethoxazole-trimethoprim], Hydroxyzine, Prednisone & diphenhydramine, Amlodipine, Ciprofloxacin, Dextromethorphan polistirex er, Nitrofurantoin, Diphenhydramine hcl, Moxifloxacin, and Prednisone  ? ?History: ?Past Medical History:  ?Diagnosis Date  ? Acquired spondylolisthesis 05/12/2015  ? Adenocarcinoma of right lung, stage 1 ([place]) 07/26/2016  ? Anxiety   ? Arthropathy of lumbar facet joint 04/11/2019  ? Formatting of this note might be different from the original. Added automatically from request for surgery [id number]  ? Atherosclerosis of aorta ([place]) 07/08/2021  ? Benign essential hypertension 12/27/2016  ? BMI 30.0-30.9,adult 12/23/2020  ? COPD (chronic obstructive pulmonary disease) ([place])   ? pt denies  ? Current mild episode of major depressive disorder ([place]) 03/24/2020  ? Daytime sleepiness 08/03/2021  ? Depression   ? Fibromyalgia 03/24/2020  ? Gastroesophageal reflux disease  without esophagitis 03/24/2020  ? Graves disease 12/27/2016  ? Hypertension   ? Osteoporosis 08/03/2021  ? Pericardial cyst 02/02/2021  ? Postoperative hypothyroidism 01/17/2015  ? Reactive airways dysfunction syndrome, unspecified asthma severity, uncomplicated ([place]) 8/84/1660  ? S/P lobectomy of lung 07/26/2015  ? Formatting of this note might be different from the original. Right upper  ? S/P lumbar spinal fusion 05/11/2018   ? Thyroid disease   ? Toe ulcer, left, limited to breakdown of skin ([place]) 12/23/2020  ? ?Past Surgical History:  ?Procedure Laterality Date  ? ANKLE SURGERY    ? bilateral  ? APPENDECTOMY    ? BREAST BIOPSY Left 02/25/2020  ? BREAST BIOPSY Right 08/17/2018  ? BREAST EXCISIONAL BIOPSY Right   ? unsure when but marked with scar marker  ? CHOLECYSTECTOMY    ? REPLACEMENT TOTAL KNEE Left   ? THYROIDECTOMY    ? ?Family History  ?Problem Relation Age of Onset  ? Cancer Mother   ? Cancer Father   ? Cancer Brother   ? Autism Grandson   ? ?Social History  ? ?Socioeconomic History  ? Marital status: Widowed  ?  Spouse name: Not on file  ? Number of children: Not on file  ? Years of education: Not on file  ? Highest education level: Not on file  ?Occupational History  ? Not on file  ?Tobacco Use  ? Smoking status: Former  ?  Packs/day: 1.50  ?  Years: 35.00  ?  Pack years: 52.50  ?  Types: Cigarettes  ?  Quit date: 07/27/1999  ?  Years since quitting: 22.7  ? Smokeless tobacco: Never  ?Vaping Use  ? Vaping Use: Never used  ?Substance and Sexual Activity  ? Alcohol use: No  ? Drug use: No  ? Sexual activity: Not Currently  ?Other Topics Concern  ? Not on file  ?Social History Narrative  ? Not on file  ? ?Social Determinants of Health  ? ?Financial Resource Strain: Low Risk   ? Difficulty of Paying Living Expenses: Not hard at all  ?Food Insecurity: Not on file  ?Transportation Needs: No Transportation Needs  ? Lack of Transportation (Medical): No  ? Lack of Transportation (Non-Medical): No  ?Physical Activity: Not on file  ?Stress: Not on file  ?Social Connections: Not on file  ? ? ?Tobacco Counseling ?Counseling given: Not Answered ? ? ?Clinical Intake: ? ?  ? ?  ? ?  ? ?  ? ?How often do you need to have someone help you when you read instructions, pamphlets, or other written materials from your doctor or pharmacy?: (P) 1 - Never ? ?Diabetic?N/A ? ?  ? ?  ? ? ?Activities of Daily Living ? ?  04/23/2022  ?  2:28 PM 10/19/2021  ?   2:10 PM  ?In your present state of health, do you have any difficulty performing the following activities:  ?Hearing? 0 0  ?Vision? 0 0  ?Difficulty concentrating or making decisions? 1 0  ?Walking or climbing stairs? 1 1  ?Dressing or bathing? 1 0  ?Doing errands, shopping? 0 0  ?Preparing Food and eating ? N   ?Using the Toilet? N   ?In the past six months, have you accidently leaked urine? N   ?Do you have problems with loss of bowel control? N   ?Managing your Medications? N   ?Managing your Finances? N   ?Housekeeping or managing your Housekeeping? Y   ? ? ?Patient Care Team: ?Reinaldo Meeker  Percell Miller, MD as PCP - General (Family Medicine) ? ?Indicate any recent Medical Services you may have received from other than [facility] providers in the past year (date may be approximate). ? ?   ?Assessment:  ? This is a routine wellness examination for Victoria Pena. ? ?Hearing/Vision screen ?No results found. ? ?Dietary issues and exercise activities discussed: ?  ? ? Goals Addressed   ?None ?  ?Depression Screen ? ?  08/10/2021  ?  2:36 PM 06/30/2021  ?  9:55 AM 06/30/2021  ?  9:52 AM 06/30/2021  ?  9:47 AM 03/12/2021  ?  3:16 PM 10/29/2020  ?  1:14 PM 08/11/2020  ? 12:01 PM  ?PHQ 2/9 Scores  ?PHQ - 2 Score 0 0 0 0 0 2 5  ?PHQ- 9 Score  2 3  0 2 12  ?  ?Fall Risk ? ?  04/23/2022  ?  2:28 PM 08/10/2021  ?  2:35 PM 06/30/2021  ?  9:46 AM  ?Fall Risk   ?Falls in the past year? 1 0 0  ?Number falls in past yr: 1 0 0  ?Injury with Fall? 0 0 0  ?Risk for fall due to :  Impaired mobility   ?Follow up  Falls evaluation completed Falls evaluation completed  ? ? ?FALL RISK PREVENTION PERTAINING TO THE HOME: ? ?Any stairs in or around the home? Yes  ?If so, are there any without handrails? Yes  ?Home free of loose throw rugs in walkways, pet beds, electrical cords, etc? Yes  ?Adequate lighting in your home to reduce risk of falls? Yes  ? ?ASSISTIVE DEVICES UTILIZED TO PREVENT FALLS: ? ?Life alert? No  ?Use of a cane, walker or w/c? Yes  ?Grab bars in the  bathroom? No  ?Shower chair or bench in shower? Yes  ?Elevated toilet seat or a handicapped toilet? No  ? ?TIMED UP AND GO: ? ?Was the test performed?  N/A .  ?Length of time to ambulate 10 feet: N/A sec.

## 2022-04-29 ENCOUNTER — Ambulatory Visit: Payer: Medicare HMO | Admitting: Physician Assistant

## 2022-05-13 ENCOUNTER — Telehealth: Payer: Self-pay

## 2022-05-13 ENCOUNTER — Other Ambulatory Visit: Payer: Self-pay | Admitting: Legal Medicine

## 2022-05-13 MED ORDER — HYDROCODONE-ACETAMINOPHEN 10-325 MG PO TABS: 1.0000 | ORAL_TABLET | Freq: Three times a day (TID) | ORAL | 0 refills | Status: AC | PRN

## 2022-05-13 NOTE — Telephone Encounter (Signed)
Patient is requesting refill of Norco 10-325 mg. This was last filled in February with 15 tabs. She only takes when tramadol is not helping.  Please advise.  Victoria Pena, [place] 05/13/22 2:25 PM

## 2022-05-27 ENCOUNTER — Other Ambulatory Visit: Payer: Self-pay | Admitting: Legal Medicine

## 2022-05-27 DIAGNOSIS — C3491 Malignant neoplasm of unspecified part of right bronchus or lung: Secondary | ICD-10-CM

## 2022-05-27 DIAGNOSIS — I159 Secondary hypertension, unspecified: Secondary | ICD-10-CM

## 2022-06-01 ENCOUNTER — Encounter: Payer: Self-pay | Admitting: Nurse Practitioner

## 2022-06-01 ENCOUNTER — Ambulatory Visit (INDEPENDENT_AMBULATORY_CARE_PROVIDER_SITE_OTHER): Payer: Medicare HMO | Admitting: Nurse Practitioner

## 2022-06-01 VITALS — BP 132/74 | HR 88 | Temp 96.8°F | Ht 65.0 in | Wt 185.0 lb

## 2022-06-01 DIAGNOSIS — W57XXXA Bitten or stung by nonvenomous insect and other nonvenomous arthropods, initial encounter: Secondary | ICD-10-CM | POA: Diagnosis not present

## 2022-06-01 DIAGNOSIS — S80861A Insect bite (nonvenomous), right lower leg, initial encounter: Secondary | ICD-10-CM

## 2022-06-01 MED ORDER — DOXYCYCLINE HYCLATE 100 MG PO TABS: 100.0000 mg | ORAL_TABLET | Freq: Two times a day (BID) | ORAL | 0 refills | Status: DC

## 2022-06-01 NOTE — Patient Instructions (Addendum)
Take Doxycycline 100 mg twice daily for 7 days Follow-up as needed  Tick Bite Information, Adult  Ticks are insects that can bite. Most ticks live in shrubs and grassy areas. They climb onto people and animals that go by. Then they bite. Some ticks carry germs that can make you sick. How can I prevent tick bites? Take these steps: Use insect repellent Use an insect repellent that has 20% or higher of the ingredients DEET, picaridin, or IR3535. Follow the instructions on the label. Put it on: Bare skin. The tops of your boots. Your pant legs. The ends of your sleeves. If you use an insect repellent that has the ingredient permethrin, follow the instructions on the label. Put it on: Clothing. Boots. Supplies or outdoor gear. Tents. When you are outside Wear long sleeves and long pants. Wear light-colored clothes. Tuck your pant legs into your socks. Stay in the middle of the trail. Do not touch the bushes. Avoid walking through long grass. Check for ticks on your clothes, hair, and skin often while you are outside. Before going inside your house, check your clothes, skin, head, neck, armpits, waist, groin, and joint areas. When you go indoors Check your clothes for ticks. Dry your clothes in a dryer on high heat for 10 minutes or more. If clothes are damp, additional time may be needed. Wash your clothes right away if they need to be washed. Use hot water. Check your pets and outdoor gear. Shower right away. Check your body for ticks. Do a full body check using a mirror. What is the right way to remove a tick? Remove the tick from your skin as soon as possible. Do not remove the tick with your bare fingers. To remove a tick that is crawling on your skin: Go outdoors and brush the tick off. Use tape or a lint roller. To remove a tick that is biting: Wash your hands. If you have latex gloves, put them on. Use tweezers, curved forceps, or a tick-removal tool to grasp the tick.  Grasp the tick as close to your skin and as close to the tick's head as possible. Gently pull up until the tick lets go. Try to keep the tick's head attached to its body. Do not twist or jerk the tick. Do not squeeze or crush the tick. Do not try to remove a tick with heat, alcohol, petroleum jelly, or fingernail polish. What should I do after taking out a tick? Throw away the tick. Do not crush a tick with your fingers. Clean the bite area and your hands with soap and water, rubbing alcohol, or an iodine wash. If an antiseptic cream or ointment is available, apply a small amount to the bite area. Wash and disinfect any instruments that you used to remove the tick. How should I get rid of a live tick? To dispose of a live tick, use one of these methods: Place the tick in rubbing alcohol. Place the tick in a bag or container you can close tightly. Wrap the tick tightly in tape. Flush the tick down the toilet. Contact a doctor if: You have symptoms, such as: A fever or chills. A red rash that makes a circle (bull's-eye rash) in the bite area. Redness and swelling where the tick bit you. Headache. Pain in a muscle, joint, or bone. Being more tired than normal. Trouble walking or moving your legs. Numbness in your legs. Tender and swollen lymph glands. A part of a tick breaks off and  gets stuck in your skin. Get help right away if: You cannot remove a tick. You cannot move (have paralysis) or feel weak. You are feeling worse or have new symptoms. You find a tick that is biting you and filled with blood. This is important if you are in an area where diseases from ticks are common. Summary Ticks may carry germs that can make you sick. To prevent tick bites wear long sleeves, long pants, and light colors. Use insect repellent. Follow the instructions on the label. If the tick is biting, do not try to remove it with heat, alcohol, petroleum jelly, or fingernail polish. Use tweezers,  curved forceps, or a tick-removal tool to grasp the tick. Gently pull up until the tick lets go. Do not twist or jerk the tick. Do not squeeze or crush the tick. If you have symptoms, contact a doctor. This information is not intended to replace advice given to you by your health care provider. Make sure you discuss any questions you have with your health care provider. Document Revised: 12/03/2019 Document Reviewed: 12/03/2019 Elsevier Patient Education  [place].

## 2022-06-01 NOTE — Progress Notes (Signed)
Acute Office Visit  Subjective:    Patient ID: Victoria Pena, female    DOB: 07/02/1951, 71 y.o.   MRN: [id number]  Chief Complaint  Patient presents with   Tick Removal    HPI: Patient is in today for tick bite 2 weeks ago. Treatment has included alcohol and peroxide to tick site. State she removed the scab to the site and made the area bleed. She is concerned with redness surrounding bite mark. Denies having a rash, fever, or headaches. States she has chronic myalgias secondary to fibromyalgia.  Past Medical History:  Diagnosis Date   Acquired spondylolisthesis 05/12/2015   Adenocarcinoma of right lung, stage 1 ([place]) 07/26/2016   Anxiety    Arthropathy of lumbar facet joint 04/11/2019   Formatting of this note might be different from the original. Added automatically from request for surgery [id number]   Atherosclerosis of aorta ([place]) 07/08/2021   Benign essential hypertension 12/27/2016   BMI 30.0-30.9,adult 12/23/2020   COPD (chronic obstructive pulmonary disease) ([place])    pt denies   Current mild episode of major depressive disorder ([place]) 03/24/2020   Daytime sleepiness 08/03/2021   Depression    Fibromyalgia 03/24/2020   Gastroesophageal reflux disease without esophagitis 03/24/2020   Graves disease 12/27/2016   Hypertension    Osteoporosis 08/03/2021   Pericardial cyst 02/02/2021   Postoperative hypothyroidism 01/17/2015   Reactive airways dysfunction syndrome, unspecified asthma severity, uncomplicated ([place]) 2/76/1470   S/P lobectomy of lung 07/26/2015   Formatting of this note might be different from the original. Right upper   S/P lumbar spinal fusion 05/11/2018   Thyroid disease    Toe ulcer, left, limited to breakdown of skin ([place]) 12/23/2020    Past Surgical History:  Procedure Laterality Date   ANKLE SURGERY     bilateral   APPENDECTOMY     BREAST BIOPSY Left 02/25/2020   BREAST BIOPSY Right 08/17/2018   BREAST EXCISIONAL BIOPSY Right    unsure when but marked with scar  marker   CHOLECYSTECTOMY     REPLACEMENT TOTAL KNEE Left    THYROIDECTOMY      Family History  Problem Relation Age of Onset   Cancer Mother    Cancer Father    Cancer Brother    Autism Grandson     Social History   Socioeconomic History   Marital status: Widowed    Spouse name: Not on file   Number of children: Not on file   Years of education: Not on file   Highest education level: Not on file  Occupational History   Not on file  Tobacco Use   Smoking status: Former    Packs/day: 1.50    Years: 35.00    Total pack years: 52.50    Types: Cigarettes    Quit date: 07/27/1999    Years since quitting: 22.8   Smokeless tobacco: Never  Vaping Use   Vaping Use: Never used  Substance and Sexual Activity   Alcohol use: No   Drug use: No   Sexual activity: Not Currently  Other Topics Concern   Not on file  Social History Narrative   Not on file   Social Determinants of Health   Financial Resource Strain: Low Risk  (04/27/2022)   Overall Financial Resource Strain (CARDIA)    Difficulty of Paying Living Expenses: Not very hard  Food Insecurity: No Food Insecurity (04/27/2022)   Hunger Vital Sign    Worried About Running Out of Food in the Last  Year: Never true    [place] in the Last Year: Never true  Transportation Needs: No Transportation Needs (04/27/2022)   PRAPARE - Hydrologist (Medical): No    Lack of Transportation (Non-Medical): No  Physical Activity: Sufficiently Active (04/27/2022)   Exercise Vital Sign    Days of Exercise per Week: 5 days    Minutes of Exercise per Session: 30 min  Stress: No Stress Concern Present (04/27/2022)   [place]    Feeling of Stress : Not at all  Social Connections: Socially Isolated (04/27/2022)   Social Connection and Isolation Panel [NHANES]    Frequency of Communication with Friends and Family: More than three times a week     Frequency of Social Gatherings with Friends and Family: Twice a week    Attends Religious Services: Never    Marine scientist or Organizations: Not on file    Attends Archivist Meetings: Never    Marital Status: Widowed  Intimate Partner Violence: Not At Risk (04/27/2022)   Humiliation, Afraid, Rape, and Kick questionnaire    Fear of Current or Ex-Partner: No    Emotionally Abused: No    Physically Abused: No    Sexually Abused: No    Outpatient Medications Prior to Visit  Medication Sig Dispense Refill   ALPRAZolam (XANAX) 0.5 MG tablet TAKE 1 TABLET(0.5 MG) BY MOUTH TWICE DAILY AS NEEDED FOR ANXIETY 60 tablet 3   buPROPion (WELLBUTRIN XL) 300 MG 24 hr tablet Take 1 tablet (300 mg total) by mouth daily. 90 tablet 2   calcium carbonate (TUMS - DOSED IN MG ELEMENTAL CALCIUM) 500 MG chewable tablet Chew 1 tablet by mouth 2 (two) times daily as needed for indigestion or heartburn.     D-Mannose 500 MG CAPS Take 500 mg by mouth 2 (two) times daily.     fluticasone (FLONASE) 50 MCG/ACT nasal spray Place 2 sprays into both nostrils daily. 16 g 6   ibuprofen (ADVIL,MOTRIN) 200 MG tablet Take 600 mg by mouth 2 (two) times daily as needed for mild pain.     levothyroxine (SYNTHROID) 125 MCG tablet TAKE 1 TABLET(125 MCG) BY MOUTH DAILY 30 tablet 3   levothyroxine (SYNTHROID) 125 MCG tablet TAKE 1 TABLET(125 MCG) BY MOUTH DAILY 30 tablet 3   losartan (COZAAR) 100 MG tablet TAKE 1 TABLET(100 MG) BY MOUTH DAILY 90 tablet 1   meloxicam (MOBIC) 15 MG tablet TAKE 1 TABLET EVERY DAY 90 tablet 1   metroNIDAZOLE (METROGEL) 0.75 % gel Apply 1 application topically 2 (two) times daily. 45 g 3   omeprazole (PRILOSEC) 40 MG capsule TAKE 1 CAPSULE ONE TIME DAILY BEFORE A MEAL 90 capsule 2   pregabalin (LYRICA) 150 MG capsule TAKE 1 CAPSULE(150 MG) BY MOUTH TWICE DAILY 60 capsule 5   Spacer/Aero-Holding Chambers (AEROCHAMBER MAX W/FLOW-VU) MISC Use as directed. 1 each 0   traMADol (ULTRAM) 50 MG  tablet TAKE 1 TABLET(50 MG) BY MOUTH FOUR TIMES DAILY 120 tablet 3   No facility-administered medications prior to visit.    Allergies  Allergen Reactions   Dextromethorphan Hbr Shortness Of Breath   Keflex [Cephalexin] Shortness Of Breath    Hard time breathing   Robaxin [Methocarbamol] Nausea Only    Stomach pain . Could not eat   Bactrim [Sulfamethoxazole-Trimethoprim] Itching   Hydroxyzine Other (See Comments) and Swelling    Numbness in lips   Prednisone &  Diphenhydramine Other (See Comments)    Face flushed, heart racing   Amlodipine Other (See Comments)    Bp worsened. Shaky. Insomnia.   Ciprofloxacin    Dextromethorphan Polistirex Er    Nitrofurantoin    Diphenhydramine Hcl Palpitations   Moxifloxacin Rash   Prednisone Other (See Comments) and Palpitations    "flushing"     Review of Systems    See pertinent positives and negatives per HPI.  Objective:    Physical Exam Vitals reviewed.  Constitutional:      Appearance: Normal appearance.  Skin:    Capillary Refill: Capillary refill takes less than 2 seconds.     Findings: Erythema and wound present.          Comments: Healing wound approximately 1 cm in diameter, surrounded by erythema  Neurological:     Mental Status: She is alert.     BP 132/74   Pulse 88   Temp (!) 96.8 F (36 C)   Ht '5\' 5"'  (1.651 m)   Wt 185 lb (83.9 kg)   LMP  (LMP Unknown)   SpO2 95%   BMI 30.79 kg/m   Wt Readings from Last 3 Encounters:  02/19/22 188 lb (85.3 kg)  01/25/22 185 lb (83.9 kg)  12/31/21 184 lb (83.5 kg)    Health Maintenance Due  Topic Date Due   Hepatitis C Screening  Never done   TETANUS/TDAP  Never done   Zoster Vaccines- Shingrix (1 of 2) Never done   COLONOSCOPY (Pts 45-60yr Insurance coverage will need to be confirmed)  Never done   COVID-19 Vaccine (3 - Moderna risk series) 03/10/2021   Pneumonia Vaccine 71 Years old (2 - PCV) 09/23/2021       Lab Results  Component Value Date   TSH  0.340 (L) 02/19/2022   Lab Results  Component Value Date   WBC 5.0 01/25/2022   HGB 14.4 01/25/2022   HCT 41.8 01/25/2022   MCV 86 01/25/2022   PLT 283 01/25/2022   Lab Results  Component Value Date   NA 138 01/25/2022   K 4.9 01/25/2022   CHLORIDE 107 11/22/2017   CO2 24 01/25/2022   GLUCOSE 103 (H) 01/25/2022   BUN 20 01/25/2022   CREATININE 0.69 01/25/2022   BILITOT 0.4 01/25/2022   ALKPHOS 175 (H) 01/25/2022   AST 15 01/25/2022   ALT 15 01/25/2022   PROT 6.9 01/25/2022   ALBUMIN 4.4 01/25/2022   CALCIUM 9.8 01/25/2022   ANIONGAP 9 11/06/2021   EGFR 93 01/25/2022   Lab Results  Component Value Date   CHOL 206 (H) 01/25/2022   Lab Results  Component Value Date   HDL 85 01/25/2022   Lab Results  Component Value Date   LDLCALC 109 (H) 01/25/2022   Lab Results  Component Value Date   TRIG 69 01/25/2022   Lab Results  Component Value Date   CHOLHDL 2.4 01/25/2022        Assessment & Plan:   1. Tick bite of right lower leg, initial encounter - doxycycline (VIBRA-TABS) 100 MG tablet; Take 1 tablet (100 mg total) by mouth 2 (two) times daily.  Dispense: 14 tablet; Refill: 0       Take Doxycycline 100 mg twice daily for 7 days Follow-up as needed  Follow-up: PRN  An After Visit Summary was printed and given to the patient.  I, SRip Harbour NP, have reviewed all documentation for this visit. The documentation on 06/01/22 for the exam, diagnosis,  procedures, and orders are all accurate and complete.   Signed, Rip Harbour, NP [place] [phone]

## 2022-06-02 ENCOUNTER — Ambulatory Visit (INDEPENDENT_AMBULATORY_CARE_PROVIDER_SITE_OTHER): Payer: Medicare HMO

## 2022-06-02 DIAGNOSIS — I10 Essential (primary) hypertension: Secondary | ICD-10-CM

## 2022-06-02 DIAGNOSIS — F419 Anxiety disorder, unspecified: Secondary | ICD-10-CM

## 2022-06-02 NOTE — Progress Notes (Signed)
Chronic Care Management Pharmacy Note  06/02/2022 Name:  Victoria Pena MRN:  [id number] DOB:  06/08/1951  Summary: -Pleasant 71 year old female presents for f/u CCM visit. 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 During the week she babysits her 71 year old autistic grandson and 14 year old granddaughter. She has a Hx of Fibromyalgia, 2 knee replacements, 2 ankle Sx, spinal fusion, Lung cancer, and Grave's Disease. She is a widow and lives by herself, takes pride in being very independent. She left home at 71 years of age and was married 3x. Her husband passed 9 years ago (2023). She is currently taking an 11 month course for medical coding and on month 2 (as of April 2023). -Was on food stamps but is over the cut-off by $200. She wants to retire but has to work until her car is paid off in 2026  Recommendations/Changes made from today's visit: -Patient content on Bupropion 350m. ADR's went away from increasing dose      Subjective: Victoria Hammerstromis an 71y.o. year old female who is a primary patient of Victoria Pena Comfort MD.  The CCM team was consulted for assistance with disease management and care coordination needs.    Engaged with patient by telephone for follow up visit in response to provider referral for pharmacy case management and/or care coordination services.   Consent to Services:  The patient was given the following information about Chronic Care Management services today, agreed to services, and gave verbal consent: 1. CCM service includes personalized support from designated clinical staff supervised by the primary care provider, including individualized plan of care and coordination with other care providers 2. 24/7 contact phone numbers for assistance for urgent and routine care needs. 3. Service will only be billed when office clinical staff spend 20 minutes or more in a month to coordinate care. 4.  Only one practitioner may furnish and bill the service in a calendar month. 5.The patient may stop CCM services at any time (effective at the end of the month) by phone call to the office staff. 6. The patient will be responsible for cost sharing (co-pay) of up to 20% of the service fee (after annual deductible is met). Patient agreed to services and consent obtained.  Patient Care Team: [name] MD as PCP - General (Family Medicine) [name](Pharmacist)  Recent office visits:  02-19-2022 [name] MD. Abnormal UA. Free T4= 1.90. TSH= 0.340. START amoxicillin 875 mg twice daily for 10 days and Zithromax 250 mg Take 2 tablets on day 1, then 1 tablet daily on days 2 through 5.   Recent consult visits:  None   Hospital visits:  None in previous 6 months   Objective:  Lab Results  Component Value Date   CREATININE 0.69 01/25/2022   BUN 20 01/25/2022   EGFR 93 01/25/2022   GFRNONAA >60 11/06/2021   GFRAA 106 12/23/2020   NA 138 01/25/2022   K 4.9 01/25/2022   CALCIUM 9.8 01/25/2022   CO2 24 01/25/2022   GLUCOSE 103 (H) 01/25/2022    No results found for: "HGBA1C", "FRUCTOSAMINE", "GFR", "MICROALBUR"  Last diabetic Eye exam: No results found for: "HMDIABEYEEXA"  Last diabetic Foot exam: No results found for: "HMDIABFOOTEX"   Lab Results  Component Value Date   CHOL 206 (H) 01/25/2022   HDL 85 01/25/2022   LDLCALC 109 (  H) 01/25/2022   TRIG 69 01/25/2022   CHOLHDL 2.4 01/25/2022       Latest Ref Rng & Units 01/25/2022    2:10 PM 11/06/2021    7:21 PM 07/08/2021    2:36 PM  Hepatic Function  Total Protein 6.0 - 8.5 g/dL 6.9  7.3  6.2   Albumin 3.7 - 4.7 g/dL 4.4  4.3  4.1   AST 0 - 40 IU/L '15  16  17   ' ALT 0 - 32 IU/L '15  13  12   ' Alk Phosphatase 44 - 121 IU/L 175  113  134   Total Bilirubin 0.0 - 1.2 mg/dL 0.4  0.6  0.4     Lab Results  Component Value Date/Time   TSH 0.340 (L) 02/19/2022 12:01 PM   TSH 0.945 01/25/2022  02:21 PM   FREET4 1.90 (H) 02/19/2022 12:01 PM   FREET4 1.41 03/24/2020 11:24 AM       Latest Ref Rng & Units 01/25/2022    2:10 PM 11/06/2021    7:21 PM 09/16/2021    2:15 PM  CBC  WBC 3.4 - 10.8 x10E3/uL 5.0  6.5  4.5   Hemoglobin 11.1 - 15.9 g/dL 14.4  14.9  13.7   Hematocrit 34.0 - 46.6 % 41.8  46.9  42.3   Platelets 150 - 450 x10E3/uL 283  264  235     Lab Results  Component Value Date/Time   VD25OH 22.3 (L) 01/25/2022 02:10 PM    Clinical ASCVD: Yes  The 10-year ASCVD risk score (Arnett DK, et al., 2019) is: 14%   Values used to calculate the score:     Age: 29 years     Sex: Female     Is Non-Hispanic African American: No     Diabetic: No     Tobacco smoker: No     Systolic Blood Pressure: 037 mmHg     Is BP treated: Yes     HDL Cholesterol: 85 mg/dL     Total Cholesterol: 206 mg/dL       04/27/2022    3:05 PM 08/10/2021    2:36 PM 06/30/2021    9:55 AM  Depression screen PHQ 2/9  Decreased Interest 0 0 0  Down, Depressed, Hopeless 0 0 0  PHQ - 2 Score 0 0 0  Altered sleeping   1  Tired, decreased energy   0  Change in appetite   0  Feeling bad or failure about yourself    0  Trouble concentrating   1  Moving slowly or fidgety/restless   0  Suicidal thoughts   0  PHQ-9 Score   2  Difficult doing work/chores   Somewhat difficult     Other: (CHADS2VASc if Afib, MMRC or CAT for COPD, ACT, DEXA)  Social History   Tobacco Use  Smoking Status Former   Packs/day: 1.50   Years: 35.00   Total pack years: 52.50   Types: Cigarettes   Quit date: 07/27/1999   Years since quitting: 22.8  Smokeless Tobacco Never   BP Readings from Last 3 Encounters:  06/01/22 132/74  04/27/22 122/79  02/19/22 140/82   Pulse Readings from Last 3 Encounters:  06/01/22 88  04/27/22 82  02/19/22 81   Wt Readings from Last 3 Encounters:  06/01/22 185 lb (83.9 kg)  02/19/22 188 lb (85.3 kg)  01/25/22 185 lb (83.9 kg)   BMI Readings from Last 3 Encounters:  06/01/22 30.79  kg/m  02/19/22 32.27  kg/m  01/25/22 31.76 kg/m    Assessment/Interventions: Review of patient past medical history, allergies, medications, health status, including review of consultants reports, laboratory and other test data, was performed as part of comprehensive evaluation and provision of chronic care management services.   SDOH:  (Social Determinants of Health) assessments and interventions performed: Yes SDOH Interventions    Flowsheet Row Most Recent Value  SDOH Interventions   Financial Strain Interventions Intervention Not Indicated  Transportation Interventions Intervention Not Indicated      SDOH Screenings   Alcohol Screen: Low Risk  (04/27/2022)   Alcohol Screen    Last Alcohol Screening Score (AUDIT): 0  Depression (PHQ2-9): Low Risk  (04/27/2022)   Depression (PHQ2-9)    PHQ-2 Score: 0  Financial Resource Strain: Low Risk  (06/02/2022)   Overall Financial Resource Strain (CARDIA)    Difficulty of Paying Living Expenses: Not very hard  Food Insecurity: No Food Insecurity (04/27/2022)   Pena Vital Sign    Worried About Running Out of Food in the Last Year: Never true    Ran Out of Food in the Last Year: Never true  Housing: Low Risk  (04/27/2022)   Housing    Last Housing Risk Score: 0  Physical Activity: Sufficiently Active (04/27/2022)   Exercise Vital Sign    Days of Exercise per Week: 5 days    Minutes of Exercise per Session: 30 min  Social Connections: Socially Isolated (04/27/2022)   Social Connection and Isolation Panel [NHANES]    Frequency of Communication with Friends and Family: More than three times a week    Frequency of Social Gatherings with Friends and Family: Twice a week    Attends Religious Services: Never    Marine scientist or Organizations: Not on file    Attends Archivist Meetings: Never    Marital Status: Widowed  Stress: No Stress Concern Present (04/27/2022)   [place]    Feeling of Stress : Not at all  Tobacco Use: Medium Risk (06/01/2022)   Patient History    Smoking Tobacco Use: Former    Smokeless Tobacco Use: Never    Passive Exposure: Not on file  Transportation Needs: No Transportation Needs (06/02/2022)   PRAPARE - Hydrologist (Medical): No    Lack of Transportation (Non-Medical): No    CCM Care Plan  Allergies  Allergen Reactions   Dextromethorphan Hbr Shortness Of Breath   Keflex [Cephalexin] Shortness Of Breath    Hard time breathing   Robaxin [Methocarbamol] Nausea Only    Stomach pain . Could not eat   Bactrim [Sulfamethoxazole-Trimethoprim] Itching   Hydroxyzine Other (See Comments) and Swelling    Numbness in lips   Prednisone & Diphenhydramine Other (See Comments)    Face flushed, heart racing   Amlodipine Other (See Comments)    Bp worsened. Shaky. Insomnia.   Ciprofloxacin    Dextromethorphan Polistirex Er    Nitrofurantoin    Diphenhydramine Hcl Palpitations   Moxifloxacin Rash   Prednisone Other (See Comments) and Palpitations    "flushing"     Medications Reviewed Today     Reviewed by Lane Hacker, [facility] (Pharmacist) on 06/02/22 at 1348  Med List Status: <None>   Medication Order Taking? Sig Documenting Provider Last Dose Status Informant  ALPRAZolam (XANAX) 0.5 MG tablet [id number] No TAKE 1 TABLET(0.5 MG) BY MOUTH TWICE DAILY AS NEEDED FOR ANXIETY Lillard Anes, MD  Taking Active   buPROPion (WELLBUTRIN XL) 300 MG 24 hr tablet [id number] No Take 1 tablet (300 mg total) by mouth daily. Lillard Anes, MD Taking Active   calcium carbonate (TUMS - DOSED IN MG ELEMENTAL CALCIUM) 500 MG chewable tablet [id number] No Chew 1 tablet by mouth 2 (two) times daily as needed for indigestion or heartburn. [provider] Taking Active   D-Mannose 500 MG CAPS [id number] No Take 500 mg by mouth 2 (two) times daily. [provider] Taking Active    doxycycline (VIBRA-TABS) 100 MG tablet [id number]  Take 1 tablet (100 mg total) by mouth 2 (two) times daily. Rip Harbour, NP  Active   fluticasone [facility]) 50 MCG/ACT nasal spray [id number] No Place 2 sprays into both nostrils daily. Rip Harbour, NP Taking Active   ibuprofen (ADVIL,MOTRIN) 200 MG tablet [id number] No Take 600 mg by mouth 2 (two) times daily as needed for mild pain. [provider] Taking Active Self  levothyroxine (SYNTHROID) 125 MCG tablet [id number]  TAKE 1 TABLET(125 MCG) BY MOUTH DAILY Lillard Anes, MD  Active   levothyroxine (SYNTHROID) 125 MCG tablet [id number]  TAKE 1 TABLET(125 MCG) BY MOUTH DAILY Lillard Anes, MD  Active   losartan (COZAAR) 100 MG tablet [id number]  TAKE 1 TABLET(100 MG) BY MOUTH DAILY Lillard Anes, MD  Active   meloxicam [facility]) 15 MG tablet [id number] No TAKE 1 TABLET EVERY DAY Lillard Anes, MD Taking Active   metroNIDAZOLE (METROGEL) 0.75 % gel [id number] No Apply 1 application topically 2 (two) times daily. Lillard Anes, MD Taking Active   omeprazole (PRILOSEC) 40 MG capsule [id number] No TAKE 1 CAPSULE ONE TIME DAILY BEFORE A MEAL Lillard Anes, MD Taking Active   pregabalin (LYRICA) 150 MG capsule [id number] No TAKE 1 CAPSULE(150 MG) BY MOUTH TWICE DAILY Lillard Anes, MD Taking Active   Spacer/Aero-Holding Chambers (AEROCHAMBER MAX W/FLOW-VU) [place] [id number] No Use as directed. Lillard Anes, MD Taking Active   traMADol Veat[facility] [place]) 50 MG tablet [id number] No TAKE 1 TABLET(50 MG) BY MOUTH FOUR TIMES DAILY Lillard Anes, MD Taking Active             Patient Active Problem List   Diagnosis Date Noted   Other specified disorders of eye and adnexa 02/19/2022   History of Graves' disease 01/25/2022   Ankle pain 01/25/2022   Osteoporosis 08/03/2021   Daytime sleepiness 08/03/2021   Atherosclerosis of aorta ([place]) 07/08/2021   COPD (chronic obstructive  pulmonary disease) (HCC)    Pericardial cyst 02/02/2021   BMI 30.0-30.9,adult 12/23/2020   Current mild episode of major depressive disorder ([place]) 03/24/2020   Gastroesophageal reflux disease without esophagitis 03/24/2020   Fibromyalgia 03/24/2020   Arthropathy of lumbar facet joint 04/11/2019   S/P lumbar spinal fusion 05/11/2018   Anxiety 12/27/2016   Benign essential hypertension 12/27/2016   Depression 12/27/2016   Adenocarcinoma of right lung, stage 1 ([place]) 07/26/2016   Reactive airways dysfunction syndrome, unspecified asthma severity, uncomplicated ([place]) 98/92/1194   S/P lobectomy of lung 07/26/2015   Acquired spondylolisthesis 05/12/2015   Postoperative hypothyroidism 01/17/2015    Immunization History  Administered Date(s) Administered   Fluad Quad(high Dose 65+) 09/23/2020, 10/27/2021   Influenza-Unspecified 08/25/2018   Moderna SARS-COV2 Booster Vaccination 02/10/2021   Moderna Sars-Covid-2 Vaccination 01/14/2020, 03/03/2020   Pneumococcal Polysaccharide-23 09/23/2020    Conditions to be addressed/monitored:  Hypertension, GERD, and Anxiety  Care Plan : [place]  Updates made by  Lane Hacker, [facility] since 06/02/2022 12:00 AM     Problem: Disease State Management   Priority: High  Onset Date: 03/30/2022     Long-Range Goal: Chronic Pain, HTN, GERD   Start Date: 03/30/2022  Expected End Date: 03/30/2023  Recent Progress: On track  Priority: High  Note:   Current Barriers:  Does not contact provider office for questions/concerns  Pharmacist Clinical Goal(s):  Patient will verbalize ability to afford treatment regimen through collaboration with PharmD and provider.   Interventions: 1:1 collaboration with Lillard Anes, MD regarding development and update of comprehensive plan of care as evidenced by provider attestation and co-signature Inter-disciplinary care team collaboration (see longitudinal plan of care) Comprehensive medication  review performed; medication list updated in electronic medical record  Hypertension (BP goal <130/80) BP Readings from Last 3 Encounters:  06/01/22 132/74  04/27/22 122/79  02/19/22 140/82  -Controlled -Current treatment: Losartan 112m Appropriate, Effective, Safe, Accessible -Medications previously tried: N/A  -Current home readings: Doesn't test -Current dietary habits: "Tries to eat healthy" -Current exercise habits: 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 -Denies hypotensive/hypertensive symptoms -Educated on BP goals and benefits of medications for prevention of heart attack, stroke and kidney damage; -Counseled to monitor BP at home PRN, document, and provide log at future appointments -Recommended to continue current medication June 2023: Patient started walking 20-30 minutes every evening!  Depression/Anxiety (Goal: Improve mood) -Not ideally controlled -Current treatment: Bupropion 3062mXL Appropriate, Query effective, Safe, Accessible Alprazolam 0.62m50mRN Appropriate, Query effective, Safe, Accessible -Medications previously tried/failed: Escitalopram -PHQ9:     04/27/2022    3:05 PM 08/10/2021    2:36 PM 06/30/2021    9:55 AM  Depression screen PHQ 2/9  Decreased Interest 0 0 0  Down, Depressed, Hopeless 0 0 0  PHQ - 2 Score 0 0 0  Altered sleeping   1  Tired, decreased energy   0  Change in appetite   0  Feeling bad or failure about yourself    0  Trouble concentrating   1  Moving slowly or fidgety/restless   0  Suicidal thoughts   0  PHQ-9 Score   2  Difficult doing work/chores   Somewhat difficult  -GAD7:     03/24/2020    2:03 PM  GAD 7 : Generalized Anxiety Score  Nervous, Anxious, on Edge 2  Control/stop worrying 1  Worry too much - different things 1  Trouble relaxing 2  Restless 2  Easily annoyed or irritable 2  Afraid  - awful might happen 2  Total GAD 7 Score 12  Anxiety Difficulty Very difficult  -Educated on Benefits of medication for symptom control April 2023: Wanted to do PHQ9/GAD7 today but patient was babysitting 3 grandkids and kept being interrupted. Will try at future visit to do scores. However, patient stated multiple times, "I'm just tired all the time" and states she is not content with therapy. Was on Bupropion 300m33m the past but was decreased due to addition of Hydroxyzine. She would like to try Bupropion 300mg42min, will ask PCP June 2023: Content on increase dose. Had shakiness at first, PCP recommended she bump down med, but patient continued taking 300mg 44mADR's dissapated   Thyroid (Grave's Disease) (Goal TSH: 0.4-5.0) Lab Results  Component Value Date   TSH 0.340 (L) 02/19/2022  -Uncontrolled -Current treatment: Levothyroxine 1262mcg 76mopriate,  Effective, Safe, Accessible -Counseled to take medication on an empty stomach -Recommended to continue current medication   GERD (Goal: minimize symptoms of reflux ) -Controlled -Current treatment  Omeprazole 57m Appropriate, Effective, Safe, Accessible -Medications previously tried: none reported  April 2023: Will go over more in depth at future visit. Main priority was mood  Chronic Pain -Not ideally controlled -Current treatment: Meloxicam 175mPRN Appropriate, Query effective, Safe, Accessible Pregabalin 15090mppropriate, Query effective, Safe, Accessible Tramadol 79m66mpropriate, Query effective, Safe, Accessible -Medications previously tried: IBU (No longer taking per patient) -Pain Scale There were no vitals filed for this visit.  Aggravating Factors: Movement  Pain Type: Chronic  April 2023: Unable to get pain scale today. Patient babysitting multiple grandkids and we kept getting interrupted. However, she did state she was not satisfied with pain management. Main priority was mood, will look into pain at  subsequent visits June 2023: Unable to conduct pain scale today, patient is unsatisfied with therapy but is able to do her daily activities of life with medications   Patient Goals/Self-Care Activities Patient will:  - take medications as prescribed as evidenced by patient report and record review  Follow Up Plan: The patient has been provided with contact information for the care management team and has been advised to call with any health related questions or concerns.   CPP F/U December 2023  [place] ConstablearSherian Rein- [phone]        Medication Assistance: None required.  Patient affirms current coverage meets needs.  Compliance/Adherence/Medication fill history: [facility]: Losartan 100 mg- Last filled 01-15-2022 90 DS  [facility]  [id number] [id number]: [phone]: [phone] - [place] [address]: [phone]: [phone] [address] [id number]: [phone]: [phone] Plan and Follow Up Patient Decision:  Patient agrees to Care Plan and Follow-up.  Plan: The patient has been provided with contact information for the care management team and has been advised to call with any health related questions or concerns.   CPP F/U Dec 2023  [place] ConstablearFlorida- [phone]

## 2022-06-02 NOTE — Patient Instructions (Signed)
Visit Information   Goals Addressed   None    Patient Care Plan: CCM Pharmacy Care Plan     Problem Identified: Disease State Management   Priority: High  Onset Date: 03/30/2022     Long-Range Goal: Chronic Pain, HTN, GERD   Start Date: 03/30/2022  Expected End Date: 03/30/2023  Recent Progress: On track  Priority: High  Note:   Current Barriers:  Does not contact provider office for questions/concerns  Pharmacist Clinical Goal(s):  Patient will verbalize ability to afford treatment regimen through collaboration with PharmD and provider.   Interventions: 1:1 collaboration with Lillard Anes, MD regarding development and update of comprehensive plan of care as evidenced by provider attestation and co-signature Inter-disciplinary care team collaboration (see longitudinal plan of care) Comprehensive medication review performed; medication list updated in electronic medical record  Hypertension (BP goal <130/80) BP Readings from Last 3 Encounters:  06/01/22 132/74  04/27/22 122/79  02/19/22 140/82  -Controlled -Current treatment: Losartan 100mg  Appropriate, Effective, Safe, Accessible -Medications previously tried: N/A  -Current home readings: Doesn't test -Current dietary habits: "Tries to eat healthy" -Current exercise habits: 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 -Denies hypotensive/hypertensive symptoms -Educated on BP goals and benefits of medications for prevention of heart attack, stroke and kidney damage; -Counseled to monitor BP at home PRN, document, and provide log at future appointments -Recommended to continue current medication June 2023: Patient started walking 20-30 minutes every evening!  Depression/Anxiety (Goal: Improve mood) -Not ideally controlled -Current treatment: Bupropion 300mg  XL Appropriate, Query effective,  Safe, Accessible Alprazolam 0.5mg  PRN Appropriate, Query effective, Safe, Accessible -Medications previously tried/failed: Escitalopram -PHQ9:     04/27/2022    3:05 PM 08/10/2021    2:36 PM 06/30/2021    9:55 AM  Depression screen PHQ 2/9  Decreased Interest 0 0 0  Down, Depressed, Hopeless 0 0 0  PHQ - 2 Score 0 0 0  Altered sleeping   1  Tired, decreased energy   0  Change in appetite   0  Feeling bad or failure about yourself    0  Trouble concentrating   1  Moving slowly or fidgety/restless   0  Suicidal thoughts   0  PHQ-9 Score   2  Difficult doing work/chores   Somewhat difficult  -GAD7:     03/24/2020    2:03 PM  GAD 7 : Generalized Anxiety Score  Nervous, Anxious, on Edge 2  Control/stop worrying 1  Worry too much - different things 1  Trouble relaxing 2  Restless 2  Easily annoyed or irritable 2  Afraid - awful might happen 2  Total GAD 7 Score 12  Anxiety Difficulty Very difficult  -Educated on Benefits of medication for symptom control April 2023: Wanted to do PHQ9/GAD7 today but patient was babysitting 3 grandkids and kept being interrupted. Will try at future visit to do scores. However, patient stated multiple times, "I'm just tired all the time" and states she is not content with therapy. Was on Bupropion 300mg  in the past but was decreased due to addition of Hydroxyzine. She would like to try Bupropion 300mg  again, will ask PCP June 2023: Content on increase dose. Had shakiness at first, PCP recommended she bump down med, but patient continued taking 300mg  and ADR's dissapated   Thyroid (Grave's Disease) (Goal TSH: 0.4-5.0) Lab Results  Component Value Date  TSH 0.340 (L) 02/19/2022  -Uncontrolled -Current treatment: Levothyroxine 182mcg Appropriate, Effective, Safe, Accessible -Counseled to take medication on an empty stomach -Recommended to continue current medication   GERD (Goal: minimize symptoms of reflux ) -Controlled -Current treatment   Omeprazole 40mg  Appropriate, Effective, Safe, Accessible -Medications previously tried: none reported  April 2023: Will go over more in depth at future visit. Main priority was mood  Chronic Pain -Not ideally controlled -Current treatment: Meloxicam 15mg  PRN Appropriate, Query effective, Safe, Accessible Pregabalin 150mg  Appropriate, Query effective, Safe, Accessible Tramadol 50mg  Appropriate, Query effective, Safe, Accessible -Medications previously tried: IBU (No longer taking per patient) -Pain Scale There were no vitals filed for this visit.  Aggravating Factors: Movement  Pain Type: Chronic  April 2023: Unable to get pain scale today. Patient babysitting multiple grandkids and we kept getting interrupted. However, she did state she was not satisfied with pain management. Main priority was mood, will look into pain at subsequent visits June 2023: Unable to conduct pain scale today, patient is unsatisfied with therapy but is able to do her daily activities of life with medications   Patient Goals/Self-Care Activities Patient will:  - take medications as prescribed as evidenced by patient report and record review  Follow Up Plan: The patient has been provided with contact information for the care management team and has been advised to call with any health related questions or concerns.   CPP F/U December 2023  [place] Constable, Sherian Rein.D. - [phone]       Ms. Comp was given information about Chronic Care Management services today including:  CCM service includes personalized support from designated clinical staff supervised by her physician, including individualized plan of care and coordination with other care providers 24/7 contact phone numbers for assistance for urgent and routine care needs. Standard insurance, coinsurance, copays and deductibles apply for chronic care management only during months in which we provide at least 20 minutes of these services. Most  insurances cover these services at 100%, however patients may be responsible for any copay, coinsurance and/or deductible if applicable. This service may help you avoid the need for more expensive face-to-face services. Only one practitioner may furnish and bill the service in a calendar month. The patient may stop CCM services at any time (effective at the end of the month) by phone call to the office staff.  Patient agreed to services and verbal consent obtained.   The patient verbalized understanding of instructions, educational materials, and care plan provided today and DECLINED offer to receive copy of patient instructions, educational materials, and care plan.  The pharmacy team will reach out to the patient again over the next 60 days.   Lane Hacker, [place]

## 2022-06-18 DIAGNOSIS — F32A Depression, unspecified: Secondary | ICD-10-CM | POA: Diagnosis not present

## 2022-06-18 DIAGNOSIS — I10 Essential (primary) hypertension: Secondary | ICD-10-CM | POA: Diagnosis not present

## 2022-06-23 DIAGNOSIS — Z1211 Encounter for screening for malignant neoplasm of colon: Secondary | ICD-10-CM | POA: Diagnosis not present

## 2022-06-28 ENCOUNTER — Encounter: Payer: Self-pay | Admitting: Nurse Practitioner

## 2022-06-28 ENCOUNTER — Ambulatory Visit (INDEPENDENT_AMBULATORY_CARE_PROVIDER_SITE_OTHER): Payer: Medicare HMO | Admitting: Nurse Practitioner

## 2022-06-28 VITALS — BP 118/76 | HR 81 | Temp 97.1°F | Ht 63.0 in | Wt 185.0 lb

## 2022-06-28 DIAGNOSIS — M545 Low back pain, unspecified: Secondary | ICD-10-CM

## 2022-06-28 DIAGNOSIS — M810 Age-related osteoporosis without current pathological fracture: Secondary | ICD-10-CM | POA: Diagnosis not present

## 2022-06-28 DIAGNOSIS — S299XXA Unspecified injury of thorax, initial encounter: Secondary | ICD-10-CM | POA: Diagnosis not present

## 2022-06-28 DIAGNOSIS — R0781 Pleurodynia: Secondary | ICD-10-CM

## 2022-06-28 DIAGNOSIS — Y92009 Unspecified place in unspecified non-institutional (private) residence as the place of occurrence of the external cause: Secondary | ICD-10-CM | POA: Diagnosis not present

## 2022-06-28 DIAGNOSIS — R300 Dysuria: Secondary | ICD-10-CM | POA: Diagnosis not present

## 2022-06-28 DIAGNOSIS — W19XXXA Unspecified fall, initial encounter: Secondary | ICD-10-CM

## 2022-06-28 DIAGNOSIS — R200 Anesthesia of skin: Secondary | ICD-10-CM | POA: Diagnosis not present

## 2022-06-28 LAB — POCT URINALYSIS DIP (CLINITEK)
Bilirubin, UA: NEGATIVE
Blood, UA: NEGATIVE
Glucose, UA: NEGATIVE mg/dL
Ketones, POC UA: NEGATIVE mg/dL
Leukocytes, UA: NEGATIVE
Nitrite, UA: NEGATIVE
Spec Grav, UA: >=1.03 — AB (ref 1.010–1.025)
Urobilinogen, UA: 0.2 E.U./dL
pH, UA: 5.5 (ref 5.0–8.0)

## 2022-06-28 NOTE — Patient Instructions (Addendum)
Fall precautions, use cane Obtain x-rays at [facility]  We will call you with results Follow-up as needed   Fall Prevention in the Home, Adult Falls can cause injuries and can happen to people of all ages. There are many things you can do to make your home safe and to help prevent falls. Ask for help when making these changes. What actions can I take to prevent falls? General Instructions Use good lighting in all rooms. Replace any light bulbs that burn out. Turn on the lights in dark areas. Use night-lights. Keep items that you use often in easy-to-reach places. Lower the shelves around your home if needed. Set up your furniture so you have a clear path. Avoid moving your furniture around. Do not have throw rugs or other things on the floor that can make you trip. Avoid walking on wet floors. If any of your floors are uneven, fix them. Add color or contrast paint or tape to clearly mark and help you see: Grab bars or handrails. First and last steps of staircases. Where the edge of each step is. If you use a stepladder: Make sure that it is fully opened. Do not climb a closed stepladder. Make sure the sides of the stepladder are locked in place. Ask someone to hold the stepladder while you use it. Know where your pets are when moving through your home. What can I do in the bathroom?     Keep the floor dry. Clean up any water on the floor right away. Remove soap buildup in the tub or shower. Use nonskid mats or decals on the floor of the tub or shower. Attach bath mats securely with double-sided, nonslip rug tape. If you need to sit down in the shower, use a plastic, nonslip stool. Install grab bars by the toilet and in the tub and shower. Do not use towel bars as grab bars. What can I do in the bedroom? Make sure that you have a light by your bed that is easy to reach. Do not use any sheets or blankets for your bed that hang to the floor. Have a firm chair with side arms  that you can use for support when you get dressed. What can I do in the kitchen? Clean up any spills right away. If you need to reach something above you, use a step stool with a grab bar. Keep electrical cords out of the way. Do not use floor polish or wax that makes floors slippery. What can I do with my stairs? Do not leave any items on the stairs. Make sure that you have a light switch at the top and the bottom of the stairs. Make sure that there are handrails on both sides of the stairs. Fix handrails that are broken or loose. Install nonslip stair treads on all your stairs. Avoid having throw rugs at the top or bottom of the stairs. Choose a carpet that does not hide the edge of the steps on the stairs. Check carpeting to make sure that it is firmly attached to the stairs. Fix carpet that is loose or worn. What can I do on the outside of my home? Use bright outdoor lighting. Fix the edges of walkways and driveways and fix any cracks. Remove anything that might make you trip as you walk through a door, such as a raised step or threshold. Trim any bushes or trees on paths to your home. Check to see if handrails are loose or broken and that both  sides of all steps have handrails. Install guardrails along the edges of any raised decks and porches. Clear paths of anything that can make you trip, such as tools or rocks. Have leaves, snow, or ice cleared regularly. Use sand or salt on paths during winter. Clean up any spills in your garage right away. This includes grease or oil spills. What other actions can I take? Wear shoes that: Have a low heel. Do not wear high heels. Have rubber bottoms. Feel good on your feet and fit well. Are closed at the toe. Do not wear open-toe sandals. Use tools that help you move around if needed. These include: Canes. Walkers. Scooters. Crutches. Review your medicines with your doctor. Some medicines can make you feel dizzy. This can increase your  chance of falling. Ask your doctor what else you can do to help prevent falls. Where to find more information Centers for Disease Control and Prevention, [place]: http://www.wolf.info/ National Institute on Aging: http://kim-miller.com/ Contact a doctor if: You are afraid of falling at home. You feel weak, drowsy, or dizzy at home. You fall at home. Summary There are many simple things that you can do to make your home safe and to help prevent falls. Ways to make your home safe include removing things that can make you trip and installing grab bars in the bathroom. Ask for help when making these changes in your home. This information is not intended to replace advice given to you by your health care provider. Make sure you discuss any questions you have with your health care provider. Document Revised: 09/07/2021 Document Reviewed: 07/09/2020 Elsevier Patient Education  [place].  Denosumab injection What is this medication? DENOSUMAB (den oh sue mab) slows bone breakdown. Prolia is used to treat osteoporosis in women after menopause and in men, and in people who are taking corticosteroids for 6 months or more. Delton See is used to treat a high calcium level due to cancer and to prevent bone fractures and other bone problems caused by multiple myeloma or cancer bone metastases. Delton See is also used to treat giant cell tumor of the bone. This medicine may be used for other purposes; ask your health care provider or pharmacist if you have questions. COMMON BRAND NAME(S): Prolia, XGEVA What should I tell my care team before I take this medication? They need to know if you have any of these conditions: dental disease having surgery or tooth extraction infection kidney disease low levels of calcium or Vitamin D in the blood malnutrition on hemodialysis skin conditions or sensitivity thyroid or parathyroid disease an unusual reaction to denosumab, other medicines, foods, dyes, or preservatives pregnant  or trying to get pregnant breast-feeding How should I use this medication? This medicine is for injection under the skin. It is given by a health care professional in a hospital or clinic setting. A special MedGuide will be given to you before each treatment. Be sure to read this information carefully each time. For Prolia, talk to your pediatrician regarding the use of this medicine in children. Special care may be needed. For Delton See, talk to your pediatrician regarding the use of this medicine in children. While this drug may be prescribed for children as young as 13 years for selected conditions, precautions do apply. Overdosage: If you think you have taken too much of this medicine contact a poison control center or emergency room at once. NOTE: This medicine is only for you. Do not share this medicine with others. What if I miss  a dose? It is important not to miss your dose. Call your doctor or health care professional if you are unable to keep an appointment. What may interact with this medication? Do not take this medicine with any of the following medications: other medicines containing denosumab This medicine may also interact with the following medications: medicines that lower your chance of fighting infection steroid medicines like prednisone or cortisone This list may not describe all possible interactions. Give your health care provider a list of all the medicines, herbs, non-prescription drugs, or dietary supplements you use. Also tell them if you smoke, drink alcohol, or use illegal drugs. Some items may interact with your medicine. What should I watch for while using this medication? Visit your doctor or health care professional for regular checks on your progress. Your doctor or health care professional may order blood tests and other tests to see how you are doing. Call your doctor or health care professional for advice if you get a fever, chills or sore throat, or other symptoms  of a cold or flu. Do not treat yourself. This drug may decrease your body's ability to fight infection. Try to avoid being around people who are sick. You should make sure you get enough calcium and vitamin D while you are taking this medicine, unless your doctor tells you not to. Discuss the foods you eat and the vitamins you take with your health care professional. See your dentist regularly. Brush and floss your teeth as directed. Before you have any dental work done, tell your dentist you are receiving this medicine. Do not become pregnant while taking this medicine or for 5 months after stopping it. Talk with your doctor or health care professional about your birth control options while taking this medicine. Women should inform their doctor if they wish to become pregnant or think they might be pregnant. There is a potential for serious side effects to an unborn child. Talk to your health care professional or pharmacist for more information. What side effects may I notice from receiving this medication? Side effects that you should report to your doctor or health care professional as soon as possible: allergic reactions like skin rash, itching or hives, swelling of the face, lips, or tongue bone pain breathing problems dizziness jaw pain, especially after dental work redness, blistering, peeling of the skin signs and symptoms of infection like fever or chills; cough; sore throat; pain or trouble passing urine signs of low calcium like fast heartbeat, muscle cramps or muscle pain; pain, tingling, numbness in the hands or feet; seizures unusual bleeding or bruising unusually weak or tired Side effects that usually do not require medical attention (report to your doctor or health care professional if they continue or are bothersome): constipation diarrhea headache joint pain loss of appetite muscle pain runny nose tiredness upset stomach This list may not describe all possible side  effects. Call your doctor for medical advice about side effects. You may report side effects to FDA at 1-800-FDA-1088. Where should I keep my medication? This medicine is only given in a clinic, doctor's office, or other health care setting and will not be stored at home. NOTE: This sheet is a summary. It may not cover all possible information. If you have questions about this medicine, talk to your doctor, pharmacist, or health care provider.  2023 Elsevier/Gold Standard (2018-04-14 00:00:00)  Eating Plan for Osteoporosis Osteoporosis causes your bones to become weak and brittle. This puts you at greater risk for bone breaks (  fractures) from small bumps or falls. Making changes to your diet and increasing your physical activity can help strengthen your bones and improve your overall health. Calcium and vitamin D are nutrients that play an important role in bone health. Vitamin D helps your body use calcium and strengthen bones. It is important to get enough calcium and vitamin D as part of your eating plan for osteoporosis. What are tips for following this plan? Reading food labels Try to get at least 1,000 milligrams (mg) of calcium each day. Look for foods that have at least 50 mg of calcium per serving. Talk with your health care provider about taking a calcium supplement if you do not get enough calcium from food. Do not have more than 2,500 mg of calcium each day. This is the upper limit for food and nutritional supplements combined. Too much calcium may cause constipation and prevent you from absorbing other important nutrients. Choose foods that contain vitamin D. Take a daily vitamin supplement that contains 800-1,000 international units (IU) of vitamin D. The amount may be different depending on your age, body weight, and where you live. Talk with your dietitian or health care provider about how much vitamin D is right for you. Avoid foods that have more than 300 mg of sodium per serving.  Too much sodium can cause your body to lose calcium. Talk with your dietitian or health care provider about how much sodium you are allowed each day. Shopping Do not buy foods with added salt, including: Salted snacks. Angie Fava. Canned soups. Canned meats. Processed meats, such as bacon or precooked or cured meat like sausages or meat loaves. Smoked fish. Meal planning Eat balanced meals that contain protein foods, fruits and vegetables, and foods rich in calcium and vitamin D. Eat at least 5 servings of fruits and vegetables each day. Eat 5-6 oz (142-170 g) of lean meat, poultry, fish, eggs, or beans each day. Lifestyle Do not use any products that contain nicotine or tobacco, such as cigarettes, e-cigarettes, and chewing tobacco. If you need help quitting, ask your health care provider. If your health care provider recommends that you lose weight: Work with a dietitian to develop an eating plan that will help you reach your desired weight goal. Exercise for at least 30 minutes a day, 5 or more days a week, or as told by your health care provider. Work with a physical therapist to develop an exercise plan that includes flexibility, balance, and strength exercises. Do not focus only on aerobic exercise. Do not drink alcohol if: Your health care provider tells you not to drink. You are pregnant, may be pregnant, or are planning to become pregnant. If you drink alcohol: Limit how much you use to: 0-1 drink a day for women. 0-2 drinks a day for men. Be aware of how much alcohol is in your drink. In the [place] one drink equals one 12 oz bottle of beer (355 mL), one 5 oz glass of wine (148 mL), or one 1 oz glass of hard liquor (44 mL). What foods should I eat? Foods high in calcium  Yogurt. Yogurt with fruit. Milk. Evaporated skim milk. Dry milk powder. Calcium-fortified orange juice. Parmesan cheese. Part-skim ricotta cheese. Natural hard cheese. Cream cheese. Cottage cheese. Canned  sardines. Canned salmon. Calcium-treated tofu. Calcium-fortified cereal bar. Calcium-fortified cereal. Calcium-fortified graham crackers. Cooked collard greens. Turnip greens. Broccoli. Kale. Almonds. White beans. Corn tortilla. Foods high in vitamin D Cod liver oil. Fatty fish, such as tuna, mackerel, and  salmon. Milk. Fortified soy milk. Fortified fruit juice. Yogurt. Margarine. Egg yolks. Foods high in protein Beef. Lamb. Pork tenderloin. Chicken breast. Tuna (canned). Fish fillet. Tofu. Cooked soy beans. Soy patty. Beans (canned or cooked). Cottage cheese. Yogurt. Peanut butter. Pumpkin seeds. Nuts. Sunflower seeds. Hard cheese. Milk or other milk products, such as soy milk. The items listed above may not be a complete list of foods and beverages you can eat. Contact a dietitian for more options. Summary Calcium and vitamin D are nutrients that play an important role in bone health and are an important part of your eating plan for osteoporosis. Eat balanced meals that contain protein foods, fruits and vegetables, and foods rich in calcium and vitamin D. Avoid foods that have more than 300 mg of sodium per serving. Too much sodium can cause your body to lose calcium. Exercise is an important part of prevention and treatment of osteoporosis. Aim for at least 30 minutes a day, 5 days a week. This information is not intended to replace advice given to you by your health care provider. Make sure you discuss any questions you have with your health care provider. Document Revised: 05/22/2020 Document Reviewed: 05/22/2020 Elsevier Patient Education  [place].  Denosumab injection What is this medication? DENOSUMAB (den oh sue mab) slows bone breakdown. Prolia is used to treat osteoporosis in women after menopause and in men, and in people who are taking corticosteroids for 6 months or more. Delton See is used to treat a high calcium level due to cancer and to prevent bone fractures and other bone problems caused by multiple myeloma or cancer bone metastases. Delton See is also used to treat giant cell tumor of the bone. This medicine may be used for other purposes; ask your health care provider or pharmacist if you have questions. COMMON BRAND NAME(S): Prolia, XGEVA What should I tell my care team before I take this medication? They need to know if you have any of these conditions: dental disease having surgery or tooth extraction infection kidney disease low levels of calcium or Vitamin D in the blood malnutrition on hemodialysis skin conditions or sensitivity thyroid or parathyroid disease an unusual reaction to denosumab, other medicines, foods, dyes, or preservatives pregnant or trying to  get pregnant breast-feeding How should I use this medication? This medicine is for injection under the skin. It is given by a health care professional in a hospital or clinic setting. A special MedGuide will be given to you before each treatment. Be sure to read this information carefully each time. For Prolia, talk to your pediatrician regarding the use of this medicine in children. Special care may be needed. For Delton See, talk to your pediatrician regarding the use of this medicine in children. While this drug may be prescribed for children as young as 13 years for selected conditions, precautions do apply. Overdosage: If you think you have taken too much of this medicine contact a poison control center or emergency room at once. NOTE: This medicine is only for you. Do not share this medicine with others. What if I miss a dose? It is important not to miss your dose. Call your doctor or health care professional if you are unable to keep an appointment. What may interact with this medication? Do not take this medicine with any of the following medications: other medicines containing denosumab This medicine may also interact with the following medications: medicines that lower your chance of fighting infection steroid medicines like prednisone or cortisone This list may not describe all possible interactions. Give your health care provider a list of all the medicines, herbs, non-prescription drugs, or dietary supplements you use. Also tell them if you smoke, drink alcohol, or use illegal drugs. Some items may interact with your medicine. What should I watch for while using this medication? Visit your doctor or health care professional for regular checks on your progress. Your doctor or health care professional may order blood tests and other tests to see how you are doing. Call your doctor or health care professional for advice if you get a fever, chills or sore throat, or other symptoms of a cold or  flu. Do not treat yourself. This drug may decrease your body's ability to fight infection. Try to avoid being around people who are sick. You should make sure you get enough calcium and vitamin D while you are taking this medicine, unless your doctor tells you not to. Discuss the foods you eat and the vitamins you take with your health care professional. See your dentist regularly. Brush and floss your teeth as directed. Before you have any dental work done, tell your dentist you are receiving this medicine. Do not become pregnant while taking this medicine or for 5 months  after stopping it. Talk with your doctor or health care professional about your birth control options while taking this medicine. Women should inform their doctor if they wish to become pregnant or think they might be pregnant. There is a potential for serious side effects to an unborn child. Talk to your health care professional or pharmacist for more information. What side effects may I notice from receiving this medication? Side effects that you should report to your doctor or health care professional as soon as possible: allergic reactions like skin rash, itching or hives, swelling of the face, lips, or tongue bone pain breathing problems dizziness jaw pain, especially after dental work redness, blistering, peeling of the skin signs and symptoms of infection like fever or chills; cough; sore throat; pain or trouble passing urine signs of low calcium like fast heartbeat, muscle cramps or muscle pain; pain, tingling, numbness in the hands or feet; seizures unusual bleeding or bruising unusually weak or tired Side effects that usually do not require medical attention (report to your doctor or health care professional if they continue or are bothersome): constipation diarrhea headache joint pain loss of appetite muscle pain runny nose tiredness upset stomach This list may not describe all possible side effects. Call your  doctor for medical advice about side effects. You may report side effects to FDA at 1-800-FDA-1088. Where should I keep my medication? This medicine is only given in a clinic, doctor's office, or other health care setting and will not be stored at home. NOTE: This sheet is a summary. It may not cover all possible information. If you have questions about this medicine, talk to your doctor, pharmacist, or health care provider.  2023 Elsevier/Gold Standard (2018-04-14 00:00:00)

## 2022-06-28 NOTE — Progress Notes (Signed)
Acute Office Visit  Subjective:    Patient ID: Victoria Pena, female    DOB: [birth year], 71 y.o.   MRN: [id number]  Chief Complaint  Patient presents with   Urinary Tract Infection   Right sided rib pain    HPI: Patient is in today for Urinary symptoms  She reports new onset dysuria and urinary urgency. The current episode started a few days ago and is nearly resolved. Patient states symptoms are mild in intensity, occurring intermittently.    States over the weekend while changing her great grand-daughters crib sheets after leaning over the crib 3-4 times she heard a load pop on her right side and believes she fractured a rib. Since then states it is painful and hurts to take a deep breath at times. She also fell on her buttocks last night while trying to play with her great grand-daughter in the year with a ball, has a history of back surgery.Pt has history of osteoporosis, T-score -3.7, not currently prescribved treatment, she declined to begin Fosamax.   Past Medical History:  Diagnosis Date   Acquired spondylolisthesis 05/12/2015   Adenocarcinoma of right lung, stage 1 ([place]) 07/26/2016   Anxiety    Arthropathy of lumbar facet joint 04/11/2019   Formatting of this note might be different from the original. Added automatically from request for surgery [id number]   Atherosclerosis of aorta ([place]) 07/08/2021   Benign essential hypertension 12/27/2016   BMI 30.0-30.9,adult 12/23/2020   COPD (chronic obstructive pulmonary disease) ([place])    pt denies   Current mild episode of major depressive disorder ([place]) 03/24/2020   Daytime sleepiness 08/03/2021   Depression    Fibromyalgia 03/24/2020   Gastroesophageal reflux disease without esophagitis 03/24/2020   Graves disease 12/27/2016   Hypertension    Osteoporosis 08/03/2021   Pericardial cyst 02/02/2021   Postoperative hypothyroidism 01/17/2015   Reactive airways dysfunction syndrome, unspecified asthma severity, uncomplicated ([place]) 2/53/6644   S/P  lobectomy of lung 07/26/2015   Formatting of this note might be different from the original. Right upper   S/P lumbar spinal fusion 05/11/2018   Thyroid disease    Toe ulcer, left, limited to breakdown of skin ([place]) 12/23/2020    Past Surgical History:  Procedure Laterality Date   ANKLE SURGERY     bilateral   APPENDECTOMY     BREAST BIOPSY Left 02/25/2020   BREAST BIOPSY Right 08/17/2018   BREAST EXCISIONAL BIOPSY Right    unsure when but marked with scar marker   CHOLECYSTECTOMY     REPLACEMENT TOTAL KNEE Left    THYROIDECTOMY      Family History  Problem Relation Age of Onset   Cancer Mother    Cancer Father    Cancer Brother    Autism Grandson     Social History   Socioeconomic History   Marital status: Widowed    Spouse name: Not on file   Number of children: Not on file   Years of education: Not on file   Highest education level: Not on file  Occupational History   Not on file  Tobacco Use   Smoking status: Former    Packs/day: 1.50    Years: 35.00    Total pack years: 52.50    Types: Cigarettes    Quit date: 07/27/1999    Years since quitting: 22.9   Smokeless tobacco: Never  Vaping Use   Vaping Use: Never used  Substance and Sexual Activity   Alcohol use: No  Drug use: No   Sexual activity: Not Currently  Other Topics Concern   Not on file  Social History Narrative   Not on file   Social Determinants of Health   Financial Resource Strain: Low Risk  (06/02/2022)   Overall Financial Resource Strain (CARDIA)    Difficulty of Paying Living Expenses: Not very hard  Food Insecurity: No Food Insecurity (04/27/2022)   Hunger Vital Sign    Worried About Running Out of Food in the Last Year: Never true    Ran Out of Food in the Last Year: Never true  Transportation Needs: No Transportation Needs (06/02/2022)   PRAPARE - Hydrologist (Medical): No    Lack of Transportation (Non-Medical): No  Physical Activity: Sufficiently Active  (04/27/2022)   Exercise Vital Sign    Days of Exercise per Week: 5 days    Minutes of Exercise per Session: 30 min  Stress: No Stress Concern Present (04/27/2022)   [place]    Feeling of Stress : Not at all  Social Connections: Socially Isolated (04/27/2022)   Social Connection and Isolation Panel [NHANES]    Frequency of Communication with Friends and Family: More than three times a week    Frequency of Social Gatherings with Friends and Family: Twice a week    Attends Religious Services: Never    Marine scientist or Organizations: Not on file    Attends Archivist Meetings: Never    Marital Status: Widowed  Intimate Partner Violence: Not At Risk (04/27/2022)   Humiliation, Afraid, Rape, and Kick questionnaire    Fear of Current or Ex-Partner: No    Emotionally Abused: No    Physically Abused: No    Sexually Abused: No    Outpatient Medications Prior to Visit  Medication Sig Dispense Refill   ALPRAZolam (XANAX) 0.5 MG tablet TAKE 1 TABLET(0.5 MG) BY MOUTH TWICE DAILY AS NEEDED FOR ANXIETY 60 tablet 3   buPROPion (WELLBUTRIN XL) 300 MG 24 hr tablet Take 1 tablet (300 mg total) by mouth daily. 90 tablet 2   calcium carbonate (TUMS - DOSED IN MG ELEMENTAL CALCIUM) 500 MG chewable tablet Chew 1 tablet by mouth 2 (two) times daily as needed for indigestion or heartburn.     D-Mannose 500 MG CAPS Take 500 mg by mouth 2 (two) times daily.     doxycycline (VIBRA-TABS) 100 MG tablet Take 1 tablet (100 mg total) by mouth 2 (two) times daily. 14 tablet 0   fluticasone (FLONASE) 50 MCG/ACT nasal spray Place 2 sprays into both nostrils daily. 16 g 6   ibuprofen (ADVIL,MOTRIN) 200 MG tablet Take 600 mg by mouth 2 (two) times daily as needed for mild pain.     levothyroxine (SYNTHROID) 125 MCG tablet TAKE 1 TABLET(125 MCG) BY MOUTH DAILY 30 tablet 3   levothyroxine (SYNTHROID) 125 MCG tablet TAKE 1 TABLET(125 MCG) BY  MOUTH DAILY 30 tablet 3   losartan (COZAAR) 100 MG tablet TAKE 1 TABLET(100 MG) BY MOUTH DAILY 90 tablet 1   meloxicam (MOBIC) 15 MG tablet TAKE 1 TABLET EVERY DAY 90 tablet 1   metroNIDAZOLE (METROGEL) 0.75 % gel Apply 1 application topically 2 (two) times daily. 45 g 3   omeprazole (PRILOSEC) 40 MG capsule TAKE 1 CAPSULE ONE TIME DAILY BEFORE A MEAL 90 capsule 2   pregabalin (LYRICA) 150 MG capsule TAKE 1 CAPSULE(150 MG) BY MOUTH TWICE DAILY 60 capsule 5  Spacer/Aero-Holding Chambers (AEROCHAMBER MAX W/FLOW-VU) MISC Use as directed. 1 each 0   traMADol (ULTRAM) 50 MG tablet TAKE 1 TABLET(50 MG) BY MOUTH FOUR TIMES DAILY 120 tablet 3   No facility-administered medications prior to visit.    Allergies  Allergen Reactions   Dextromethorphan Hbr Shortness Of Breath   Keflex [Cephalexin] Shortness Of Breath    Hard time breathing   Robaxin [Methocarbamol] Nausea Only    Stomach pain . Could not eat   Bactrim [Sulfamethoxazole-Trimethoprim] Itching   Hydroxyzine Other (See Comments) and Swelling    Numbness in lips   Prednisone & Diphenhydramine Other (See Comments)    Face flushed, heart racing   Amlodipine Other (See Comments)    Bp worsened. Shaky. Insomnia.   Ciprofloxacin    Dextromethorphan Polistirex Er    Nitrofurantoin    Diphenhydramine Hcl Palpitations   Moxifloxacin Rash   Prednisone Other (See Comments) and Palpitations    "flushing"     Review of Systems  Musculoskeletal:  Positive for arthralgias (right rib cage), back pain (lower back) and gait problem (ambulates with cane).       Objective:    Physical Exam Vitals reviewed.  Constitutional:      Appearance: Normal appearance.  Abdominal:     Tenderness: There is abdominal tenderness (right ribs).  Skin:    General: Skin is warm.     Capillary Refill: Capillary refill takes less than 2 seconds.     Findings: No bruising or erythema.  Neurological:     General: No focal deficit present.     Mental  Status: She is alert and oriented to person, place, and time.     BP 118/76   Pulse 81   Temp (!) 97.1 F (36.2 C)   Ht '5\' 3"'  (1.6 m)   Wt 185 lb (83.9 kg)   LMP  (LMP Unknown)   SpO2 99%   BMI 32.77 kg/m  Wt Readings from Last 3 Encounters:  06/28/22 185 lb (83.9 kg)  06/01/22 185 lb (83.9 kg)  02/19/22 188 lb (85.3 kg)    Health Maintenance Due  Topic Date Due   Hepatitis C Screening  Never done   TETANUS/TDAP  Never done   Zoster Vaccines- Shingrix (1 of 2) Never done   COLONOSCOPY (Pts 45-84yr Insurance coverage will need to be confirmed)  Never done   COVID-19 Vaccine (3 - Moderna risk series) 03/10/2021   Pneumonia Vaccine 71 Years old (2 - PCV) 09/23/2021   MAMMOGRAM  06/15/2022       Lab Results  Component Value Date   TSH 0.340 (L) 02/19/2022   Lab Results  Component Value Date   WBC 5.0 01/25/2022   HGB 14.4 01/25/2022   HCT 41.8 01/25/2022   MCV 86 01/25/2022   PLT 283 01/25/2022   Lab Results  Component Value Date   NA 138 01/25/2022   K 4.9 01/25/2022   CHLORIDE 107 11/22/2017   CO2 24 01/25/2022   GLUCOSE 103 (H) 01/25/2022   BUN 20 01/25/2022   CREATININE 0.69 01/25/2022   BILITOT 0.4 01/25/2022   ALKPHOS 175 (H) 01/25/2022   AST 15 01/25/2022   ALT 15 01/25/2022   PROT 6.9 01/25/2022   ALBUMIN 4.4 01/25/2022   CALCIUM 9.8 01/25/2022   ANIONGAP 9 11/06/2021   EGFR 93 01/25/2022   Lab Results  Component Value Date   CHOL 206 (H) 01/25/2022   Lab Results  Component Value Date   HDL 85 01/25/2022  Lab Results  Component Value Date   LDLCALC 109 (H) 01/25/2022   Lab Results  Component Value Date   TRIG 69 01/25/2022   Lab Results  Component Value Date   CHOLHDL 2.4 01/25/2022        Assessment & Plan:   1. Age-related osteoporosis without current pathological fracture - DG Ribs Unilateral Right; Future - DG Lumbar Spine Complete; Future -consider Prolia injections -pt declined Fosamax prescribed by PCP  2.  Rib pain on right side - DG Ribs Unilateral Right; Future - DG Lumbar Spine Complete; Future  3. Lumbar pain - DG Ribs Unilateral Right; Future - DG Lumbar Spine Complete; Future  4. Fall in home, initial encounter - DG Ribs Unilateral Right; Future - DG Lumbar Spine Complete; Future  5. Dysuria - POCT URINALYSIS DIP (CLINITEK)    Fall precautions, use cane Obtain x-rays at [facility]  We will call you with results Follow-up as needed     Follow-up: PRN  An After Visit Summary was printed and given to the patient.  I, Rip Harbour, NP, have reviewed all documentation for this visit. The documentation on 06/28/22 for the exam, diagnosis, procedures, and orders are all accurate and complete.   Signed, Rip Harbour, NP [place] [phone]

## 2022-06-30 ENCOUNTER — Other Ambulatory Visit: Payer: Self-pay | Admitting: Nurse Practitioner

## 2022-07-01 LAB — COLOGUARD: COLOGUARD: NEGATIVE

## 2022-07-01 NOTE — Progress Notes (Signed)
Negative cologard lp

## 2022-07-07 ENCOUNTER — Other Ambulatory Visit: Payer: Self-pay

## 2022-07-07 DIAGNOSIS — M810 Age-related osteoporosis without current pathological fracture: Secondary | ICD-10-CM

## 2022-07-07 DIAGNOSIS — R0781 Pleurodynia: Secondary | ICD-10-CM

## 2022-07-07 DIAGNOSIS — M545 Low back pain, unspecified: Secondary | ICD-10-CM

## 2022-07-07 DIAGNOSIS — W19XXXA Unspecified fall, initial encounter: Secondary | ICD-10-CM

## 2022-07-09 ENCOUNTER — Other Ambulatory Visit: Payer: Self-pay | Admitting: Legal Medicine

## 2022-07-09 DIAGNOSIS — M797 Fibromyalgia: Secondary | ICD-10-CM

## 2022-07-16 ENCOUNTER — Other Ambulatory Visit: Payer: Self-pay | Admitting: Legal Medicine

## 2022-07-16 DIAGNOSIS — Z1231 Encounter for screening mammogram for malignant neoplasm of breast: Secondary | ICD-10-CM

## 2022-07-25 ENCOUNTER — Other Ambulatory Visit: Payer: Self-pay | Admitting: Legal Medicine

## 2022-07-25 DIAGNOSIS — F064 Anxiety disorder due to known physiological condition: Secondary | ICD-10-CM

## 2022-07-26 ENCOUNTER — Telehealth: Payer: Self-pay | Admitting: Physician Assistant

## 2022-07-26 ENCOUNTER — Telehealth: Payer: Self-pay

## 2022-07-26 NOTE — Progress Notes (Signed)
Pt called and lvm with questions about medications but it looks like there is a pending note already sent to Dr. Henrene Pastor. I called pt and left a message to call back if she needs [place] and that there is a pending message.  Elray Mcgregor, [place] Pharmacist Assistant  [phone]

## 2022-07-26 NOTE — Telephone Encounter (Signed)
Victoria Pena called to report that she is not tolerating the increased dose of the welbutrin very well. She has been taking the 300 mg as ordered and seen an improvement in her mood but she is experiencing an intolerance to heat and being outside.  She also reports that she has not been sleeping well.  She has been taking the xanax at bedtime but is questioning whether she can take melatonin as well.  She is going to be leaving on vacation on Thursday but wanted to hear back from Dr. Henrene Pastor before if possible.

## 2022-07-26 NOTE — Telephone Encounter (Signed)
Per 8/7 phone line.   Pt called to schedule appointment to go over ct from February made appointment per pt request

## 2022-07-27 ENCOUNTER — Telehealth: Payer: Self-pay

## 2022-07-27 NOTE — Telephone Encounter (Signed)
Can use melatonin lp

## 2022-07-27 NOTE — Telephone Encounter (Signed)
I left detailed message on voicemail. ?

## 2022-07-27 NOTE — Telephone Encounter (Signed)
Patient was informed.

## 2022-07-27 NOTE — Telephone Encounter (Signed)
Patient called and stated she wanted to know if anything can be done about the sweating she is having that she believe is caused by wellbutrrin. Please advise.

## 2022-08-02 ENCOUNTER — Telehealth: Payer: Self-pay

## 2022-08-02 NOTE — Progress Notes (Cosign Needed Addendum)
Care Gap(s) Not Met that Need to be Addressed:   Colorectal Cancer Screening  Controlling High Blood Pressure   Action Taken: Updated care gap Cologuard done on 07/01/22 and latest BP 06/28/22 118/76   Follow Up: 08/10/22 with Dr. Henrene Pastor

## 2022-08-09 NOTE — Progress Notes (Unsigned)
Subjective:  Patient ID: Victoria Pena, female    DOB: [birth year]  Age: 71 y.o. MRN: [id number]  Chief Complaint  Patient presents with   Hypertension    HPI Hypothyroidism: Patient is taking Levothyroxine 125 mg daily.  GERD: Patient was Omeprazole 40 mg  daily.  Hypertension: Patient is taking Losartan 100 mg daily. Current Outpatient Medications on File Prior to Visit  Medication Sig Dispense Refill   ALPRAZolam (XANAX) 0.5 MG tablet TAKE 1 TABLET(0.5 MG) BY MOUTH TWICE DAILY AS NEEDED FOR ANXIETY 60 tablet 3   buPROPion (WELLBUTRIN XL) 300 MG 24 hr tablet Take 1 tablet (300 mg total) by mouth daily. 90 tablet 2   calcium carbonate (TUMS - DOSED IN MG ELEMENTAL CALCIUM) 500 MG chewable tablet Chew 1 tablet by mouth 2 (two) times daily as needed for indigestion or heartburn.     D-Mannose 500 MG CAPS Take 500 mg by mouth 2 (two) times daily.     doxycycline (VIBRA-TABS) 100 MG tablet Take 1 tablet (100 mg total) by mouth 2 (two) times daily. 14 tablet 0   fluticasone (FLONASE) 50 MCG/ACT nasal spray Place 2 sprays into both nostrils daily. 16 g 6   ibuprofen (ADVIL,MOTRIN) 200 MG tablet Take 600 mg by mouth 2 (two) times daily as needed for mild pain.     levothyroxine (SYNTHROID) 125 MCG tablet TAKE 1 TABLET(125 MCG) BY MOUTH DAILY 30 tablet 3   levothyroxine (SYNTHROID) 125 MCG tablet TAKE 1 TABLET(125 MCG) BY MOUTH DAILY 30 tablet 3   losartan (COZAAR) 100 MG tablet TAKE 1 TABLET(100 MG) BY MOUTH DAILY 90 tablet 1   meloxicam (MOBIC) 15 MG tablet TAKE 1 TABLET EVERY DAY 90 tablet 1   metroNIDAZOLE (METROGEL) 0.75 % gel Apply 1 application topically 2 (two) times daily. 45 g 3   omeprazole (PRILOSEC) 40 MG capsule TAKE 1 CAPSULE ONE TIME DAILY BEFORE A MEAL 90 capsule 2   pregabalin (LYRICA) 150 MG capsule TAKE 1 CAPSULE(150 MG) BY MOUTH TWICE DAILY 60 capsule 3   Spacer/Aero-Holding Chambers (AEROCHAMBER MAX W/FLOW-VU) MISC Use as directed. 1 each 0   traMADol (ULTRAM) 50 MG  tablet TAKE 1 TABLET(50 MG) BY MOUTH FOUR TIMES DAILY 120 tablet 3   No current facility-administered medications on file prior to visit.   Past Medical History:  Diagnosis Date   Acquired spondylolisthesis 05/12/2015   Adenocarcinoma of right lung, stage 1 ([place]) 07/26/2016   Anxiety    Arthropathy of lumbar facet joint 04/11/2019   Formatting of this note might be different from the original. Added automatically from request for surgery [id number]   Atherosclerosis of aorta ([place]) 07/08/2021   Benign essential hypertension 12/27/2016   BMI 30.0-30.9,adult 12/23/2020   COPD (chronic obstructive pulmonary disease) ([place])    pt denies   Current mild episode of major depressive disorder ([place]) 03/24/2020   Daytime sleepiness 08/03/2021   Depression    Fibromyalgia 03/24/2020   Gastroesophageal reflux disease without esophagitis 03/24/2020   Graves disease 12/27/2016   Hypertension    Osteoporosis 08/03/2021   Pericardial cyst 02/02/2021   Postoperative hypothyroidism 01/17/2015   Reactive airways dysfunction syndrome, unspecified asthma severity, uncomplicated ([place]) 7/56/4332   S/P lobectomy of lung 07/26/2015   Formatting of this note might be different from the original. Right upper   S/P lumbar spinal fusion 05/11/2018   Thyroid disease    Toe ulcer, left, limited to breakdown of skin ([place]) 12/23/2020   Past Surgical History:  Procedure Laterality  Date   ANKLE SURGERY     bilateral   APPENDECTOMY     BREAST BIOPSY Left 02/25/2020   BREAST BIOPSY Right 08/17/2018   BREAST EXCISIONAL BIOPSY Right    unsure when but marked with scar marker   CHOLECYSTECTOMY     REPLACEMENT TOTAL KNEE Left    THYROIDECTOMY      Family History  Problem Relation Age of Onset   Cancer Mother    Cancer Father    Cancer Brother    Autism Grandson    Social History   Socioeconomic History   Marital status: Widowed    Spouse name: Not on file   Number of children: Not on file   Years of education: Not on file    Highest education level: Not on file  Occupational History   Not on file  Tobacco Use   Smoking status: Former    Packs/day: 1.50    Years: 35.00    Total pack years: 52.50    Types: Cigarettes    Quit date: 07/27/1999    Years since quitting: 23.0   Smokeless tobacco: Never  Vaping Use   Vaping Use: Never used  Substance and Sexual Activity   Alcohol use: No   Drug use: No   Sexual activity: Not Currently  Other Topics Concern   Not on file  Social History Narrative   Not on file   Social Determinants of Health   Financial Resource Strain: Low Risk  (06/02/2022)   Overall Financial Resource Strain (CARDIA)    Difficulty of Paying Living Expenses: Not very hard  Food Insecurity: No Food Insecurity (04/27/2022)   Hunger Vital Sign    Worried About Running Out of Food in the Last Year: Never true    Ran Out of Food in the Last Year: Never true  Transportation Needs: No Transportation Needs (06/02/2022)   PRAPARE - Hydrologist (Medical): No    Lack of Transportation (Non-Medical): No  Physical Activity: Sufficiently Active (04/27/2022)   Exercise Vital Sign    Days of Exercise per Week: 5 days    Minutes of Exercise per Session: 30 min  Stress: No Stress Concern Present (04/27/2022)   [place]    Feeling of Stress : Not at all  Social Connections: Socially Isolated (04/27/2022)   Social Connection and Isolation Panel [NHANES]    Frequency of Communication with Friends and Family: More than three times a week    Frequency of Social Gatherings with Friends and Family: Twice a week    Attends Religious Services: Never    Marine scientist or Organizations: Not on file    Attends Archivist Meetings: Never    Marital Status: Widowed    Review of Systems   Objective:  LMP  (LMP Unknown)      06/28/2022    3:36 PM 06/01/2022    3:15 PM 04/27/2022    3:16 PM  BP/Weight   Systolic BP 416 384 536  Diastolic BP 76 74 79  Wt. (Lbs) 185 185   BMI 32.77 kg/m2 30.79 kg/m2     Physical Exam  Diabetic Foot Exam - Simple   No data filed      Lab Results  Component Value Date   WBC 5.0 01/25/2022   HGB 14.4 01/25/2022   HCT 41.8 01/25/2022   PLT 283 01/25/2022   GLUCOSE 103 (H) 01/25/2022   CHOL  206 (H) 01/25/2022   TRIG 69 01/25/2022   HDL 85 01/25/2022   LDLCALC 109 (H) 01/25/2022   ALT 15 01/25/2022   AST 15 01/25/2022   NA 138 01/25/2022   K 4.9 01/25/2022   CL 102 01/25/2022   CREATININE 0.69 01/25/2022   BUN 20 01/25/2022   CO2 24 01/25/2022   TSH 0.340 (L) 02/19/2022   INR 0.95 05/03/2018      Assessment & Plan:   Problem List Items Addressed This Visit       Cardiovascular and Mediastinum   Benign essential hypertension - Primary   Atherosclerosis of aorta (HCC)     Respiratory   Adenocarcinoma of right lung, stage 1 (HCC)   COPD (chronic obstructive pulmonary disease) (HCC)     Digestive   Gastroesophageal reflux disease without esophagitis     Endocrine   Postoperative hypothyroidism     Musculoskeletal and Integument   Osteoporosis     Other   Anxiety   Fibromyalgia  .  No orders of the defined types were placed in this encounter.   No orders of the defined types were placed in this encounter.    Follow-up: No follow-ups on file.  An After Visit Summary was printed and given to the patient.  Reinaldo Meeker, MD [facility] [phone]

## 2022-08-10 ENCOUNTER — Ambulatory Visit (INDEPENDENT_AMBULATORY_CARE_PROVIDER_SITE_OTHER): Payer: Medicare HMO | Admitting: Legal Medicine

## 2022-08-10 ENCOUNTER — Encounter: Payer: Self-pay | Admitting: Legal Medicine

## 2022-08-10 VITALS — BP 112/80 | HR 74 | Temp 98.1°F | Resp 14 | Ht 63.0 in | Wt 182.0 lb

## 2022-08-10 DIAGNOSIS — I700 Atherosclerosis of aorta: Secondary | ICD-10-CM

## 2022-08-10 DIAGNOSIS — J432 Centrilobular emphysema: Secondary | ICD-10-CM

## 2022-08-10 DIAGNOSIS — M797 Fibromyalgia: Secondary | ICD-10-CM

## 2022-08-10 DIAGNOSIS — F419 Anxiety disorder, unspecified: Secondary | ICD-10-CM

## 2022-08-10 DIAGNOSIS — I10 Essential (primary) hypertension: Secondary | ICD-10-CM | POA: Diagnosis not present

## 2022-08-10 DIAGNOSIS — E890 Postprocedural hypothyroidism: Secondary | ICD-10-CM

## 2022-08-10 DIAGNOSIS — R5383 Other fatigue: Secondary | ICD-10-CM

## 2022-08-10 DIAGNOSIS — K219 Gastro-esophageal reflux disease without esophagitis: Secondary | ICD-10-CM

## 2022-08-10 DIAGNOSIS — C3491 Malignant neoplasm of unspecified part of right bronchus or lung: Secondary | ICD-10-CM | POA: Diagnosis not present

## 2022-08-10 DIAGNOSIS — M810 Age-related osteoporosis without current pathological fracture: Secondary | ICD-10-CM | POA: Diagnosis not present

## 2022-08-10 DIAGNOSIS — M6740 Ganglion, unspecified site: Secondary | ICD-10-CM

## 2022-08-11 ENCOUNTER — Other Ambulatory Visit: Payer: Self-pay

## 2022-08-11 ENCOUNTER — Other Ambulatory Visit: Payer: Self-pay | Admitting: Legal Medicine

## 2022-08-11 DIAGNOSIS — E782 Mixed hyperlipidemia: Secondary | ICD-10-CM

## 2022-08-11 DIAGNOSIS — C3491 Malignant neoplasm of unspecified part of right bronchus or lung: Secondary | ICD-10-CM

## 2022-08-11 LAB — COMPREHENSIVE METABOLIC PANEL
ALT: 11 IU/L (ref 0–32)
AST: 11 IU/L (ref 0–40)
Albumin/Globulin Ratio: 1.8 (ref 1.2–2.2)
Albumin: 4.1 g/dL (ref 3.8–4.8)
Alkaline Phosphatase: 148 IU/L — ABNORMAL HIGH (ref 44–121)
BUN/Creatinine Ratio: 21 (ref 12–28)
BUN: 15 mg/dL (ref 8–27)
Bilirubin Total: 0.5 mg/dL (ref 0.0–1.2)
CO2: 22 mmol/L (ref 20–29)
Calcium: 7.8 mg/dL — ABNORMAL LOW (ref 8.7–10.3)
Chloride: 107 mmol/L — ABNORMAL HIGH (ref 96–106)
Creatinine, Ser: 0.71 mg/dL (ref 0.57–1.00)
Globulin, Total: 2.3 g/dL (ref 1.5–4.5)
Glucose: 92 mg/dL (ref 70–99)
Potassium: 4.4 mmol/L (ref 3.5–5.2)
Sodium: 142 mmol/L (ref 134–144)
Total Protein: 6.4 g/dL (ref 6.0–8.5)
eGFR: 91 mL/min/{1.73_m2} (ref >59)

## 2022-08-11 LAB — CBC WITH DIFFERENTIAL/PLATELET
Basophils Absolute: 0 10*3/uL (ref 0.0–0.2)
Basos: 1 %
EOS (ABSOLUTE): 0.2 10*3/uL (ref 0.0–0.4)
Eos: 3 %
Hematocrit: 42.9 % (ref 34.0–46.6)
Hemoglobin: 13.9 g/dL (ref 11.1–15.9)
Immature Grans (Abs): 0 10*3/uL (ref 0.0–0.1)
Immature Granulocytes: 0 %
Lymphocytes Absolute: 1.5 10*3/uL (ref 0.7–3.1)
Lymphs: 31 %
MCH: 29.1 pg (ref 26.6–33.0)
MCHC: 32.4 g/dL (ref 31.5–35.7)
MCV: 90 fL (ref 79–97)
Monocytes Absolute: 0.5 10*3/uL (ref 0.1–0.9)
Monocytes: 10 %
Neutrophils Absolute: 2.6 10*3/uL (ref 1.4–7.0)
Neutrophils: 55 %
Platelets: 263 10*3/uL (ref 150–450)
RBC: 4.77 x10E6/uL (ref 3.77–5.28)
RDW: 12.9 % (ref 11.7–15.4)
WBC: 4.8 10*3/uL (ref 3.4–10.8)

## 2022-08-11 LAB — CARDIOVASCULAR RISK ASSESSMENT

## 2022-08-11 LAB — TSH: TSH: 2.92 u[IU]/mL (ref 0.450–4.500)

## 2022-08-11 LAB — LIPID PANEL
Chol/HDL Ratio: 2.7 ratio (ref 0.0–4.4)
Cholesterol, Total: 204 mg/dL — ABNORMAL HIGH (ref 100–199)
HDL: 75 mg/dL (ref >39)
LDL Chol Calc (NIH): 116 mg/dL — ABNORMAL HIGH (ref 0–99)
Triglycerides: 72 mg/dL (ref 0–149)
VLDL Cholesterol Cal: 13 mg/dL (ref 5–40)

## 2022-08-11 MED ORDER — PRAVASTATIN SODIUM 40 MG PO TABS: 40.0000 mg | ORAL_TABLET | Freq: Every day | ORAL | 3 refills | Status: DC

## 2022-08-11 MED ORDER — HYDROCODONE-ACETAMINOPHEN 10-325 MG PO TABS: 1.0000 | ORAL_TABLET | Freq: Three times a day (TID) | ORAL | 0 refills | Status: DC | PRN

## 2022-08-11 NOTE — Progress Notes (Signed)
Kidney and liver tests normal, LDL cholesterol 116, TSH 2.92, CBC normal

## 2022-08-18 ENCOUNTER — Ambulatory Visit: Payer: Medicare HMO

## 2022-08-19 DIAGNOSIS — M7989 Other specified soft tissue disorders: Secondary | ICD-10-CM | POA: Diagnosis not present

## 2022-08-19 DIAGNOSIS — M19041 Primary osteoarthritis, right hand: Secondary | ICD-10-CM | POA: Diagnosis not present

## 2022-08-31 ENCOUNTER — Telehealth: Payer: Self-pay

## 2022-08-31 ENCOUNTER — Other Ambulatory Visit: Payer: Self-pay | Admitting: Legal Medicine

## 2022-08-31 ENCOUNTER — Other Ambulatory Visit: Payer: Self-pay

## 2022-08-31 MED ORDER — ATORVASTATIN CALCIUM 40 MG PO TABS: 40.0000 mg | ORAL_TABLET | Freq: Every day | ORAL | 0 refills | Status: DC

## 2022-08-31 NOTE — Chronic Care Management (AMB) (Unsigned)
Chronic Care Management Pharmacy Assistant   Name: Victoria Pena  MRN: [id number] DOB: Apr 25, 1951  Reason for Encounter: Disease State/ Hypertension  Recent office visits:  08-10-2022 Lillard Anes, MD. Chloride= 107, Calcium= 7.8, Alkaline Phosphatase= 148. Cholesterol= 204, LDL= 116. Referral placed to orthopedics.  06-28-2022 Rip Harbour, NP. Abnormal UA. DG Lumbar Spine Complete and DG Ribs Unilateral Right completed.  Recent consult visits:  None  Hospital visits:  None in previous 6 months  Medications: Outpatient Encounter Medications as of 08/31/2022  Medication Sig   ALPRAZolam (XANAX) 0.5 MG tablet TAKE 1 TABLET(0.5 MG) BY MOUTH TWICE DAILY AS NEEDED FOR ANXIETY   buPROPion (WELLBUTRIN XL) 300 MG 24 hr tablet Take 1 tablet (300 mg total) by mouth daily.   calcium carbonate (TUMS - DOSED IN MG ELEMENTAL CALCIUM) 500 MG chewable tablet Chew 1 tablet by mouth 2 (two) times daily as needed for indigestion or heartburn.   fluticasone (FLONASE) 50 MCG/ACT nasal spray Place 2 sprays into both nostrils daily.   HYDROcodone-acetaminophen (NORCO) 10-325 MG tablet Take 1 tablet by mouth every 8 (eight) hours as needed for severe pain.   ibuprofen (ADVIL,MOTRIN) 200 MG tablet Take 600 mg by mouth 2 (two) times daily as needed for mild pain.   levothyroxine (SYNTHROID) 125 MCG tablet TAKE 1 TABLET(125 MCG) BY MOUTH DAILY   losartan (COZAAR) 100 MG tablet TAKE 1 TABLET(100 MG) BY MOUTH DAILY   meloxicam (MOBIC) 15 MG tablet TAKE 1 TABLET EVERY DAY   metroNIDAZOLE (METROGEL) 0.75 % gel Apply 1 application topically 2 (two) times daily.   omeprazole (PRILOSEC) 40 MG capsule TAKE 1 CAPSULE ONE TIME DAILY BEFORE A MEAL   pravastatin (PRAVACHOL) 40 MG tablet Take 1 tablet (40 mg total) by mouth daily.   pregabalin (LYRICA) 150 MG capsule TAKE 1 CAPSULE(150 MG) BY MOUTH TWICE DAILY   Spacer/Aero-Holding Chambers (AEROCHAMBER MAX W/FLOW-VU) MISC Use as directed.    traMADol (ULTRAM) 50 MG tablet TAKE 1 TABLET(50 MG) BY MOUTH FOUR TIMES DAILY   No facility-administered encounter medications on file as of 08/31/2022.   Recent Office Vitals: BP Readings from Last 3 Encounters:  08/10/22 112/80  06/28/22 118/76  06/01/22 132/74   Pulse Readings from Last 3 Encounters:  08/10/22 74  06/28/22 81  06/01/22 88    Wt Readings from Last 3 Encounters:  08/10/22 182 lb (82.6 kg)  06/28/22 185 lb (83.9 kg)  06/01/22 185 lb (83.9 kg)     Kidney Function Lab Results  Component Value Date/Time   CREATININE 0.71 08/10/2022 10:56 AM   CREATININE 0.69 01/25/2022 02:10 PM   CREATININE 0.74 01/23/2021 02:53 PM   CREATININE 0.77 01/25/2020 01:45 PM   CREATININE 0.8 11/22/2017 02:51 PM   CREATININE 0.8 05/24/2017 09:34 AM   GFRNONAA >60 11/06/2021 07:21 PM   GFRNONAA >60 01/23/2021 02:53 PM   GFRAA 106 12/23/2020 11:23 AM   GFRAA >60 01/25/2020 01:45 PM       Latest Ref Rng & Units 08/10/2022   10:56 AM 01/25/2022    2:10 PM 11/06/2021    7:21 PM  BMP  Glucose 70 - 99 mg/dL 92  103  96   BUN 8 - 27 mg/dL 15  20  21    Creatinine 0.57 - 1.00 mg/dL 0.71  0.69  0.67   BUN/Creat Ratio 12 - 28 21  29     Sodium 134 - 144 mmol/L 142  138  142   Potassium 3.5 - 5.2  mmol/L 4.4  4.9  3.8   Chloride 96 - 106 mmol/L 107  102  108   CO2 20 - 29 mmol/L 22  24  25    Calcium 8.7 - 10.3 mg/dL 7.8  9.8  9.9      Current antihypertensive regimen:  Losartan 100 mg daily  Patient verbally confirms she is taking the above medications as directed. {yes/no:20286}  How often are you checking your Blood Pressure? {CHL HP BP Monitoring Frequency:[phone]}  she checks her blood pressure {timing:25218} {before/after:25217} taking her medication.  Current home BP readings: *** Wrist or arm cuff: Caffeine intake: Salt intake: OTC medications including pseudoephedrine or NSAIDs?  Any readings above 180/120? {yes/no:20286} If yes any symptoms of hypertensive  emergency? {hypertensive emergency symptoms:25354}   What recent interventions/DTPs have been made by any provider to improve Blood Pressure control since last CPP Visit:  Educated on BP goals and benefits of medications for prevention of heart attack, stroke and kidney damage; -Counseled to monitor BP at home PRN, document, and provide log at future appointments -Recommended to continue current medication  Any recent hospitalizations or ED visits since last visit with CPP? No  What diet changes have been made to improve Blood Pressure Control?  ***  What exercise is being done to improve your Blood Pressure Control?  ***  Adherence Review: Is the patient currently on ACE/ARB medication? Yes Does the patient have >5 day gap between last estimated fill dates? CPP to review  08-31-2022: 1st attempt left VM 09-02-2022: 2nd attempt left VM  Care Gaps: Last annual wellness visit? 04-27-2022 Tdap overdue Hep C screening overdue Shingrix overdue PNA vac overdue Covid booster overdue Flu vaccine overdue  Star Rating Drugs: Losartan 100 mg- Last filled 08-22-2022 90 DS  Pravastatin 40 mg- Last filled 08-11-2022 90 DS  [place] Clinical Pharmacist Assistant [phone]

## 2022-08-31 NOTE — Telephone Encounter (Signed)
Patient left a voicemail stating she discontinued taking her cholesterol medication due to the side effects and wanted to know what else could she take.

## 2022-08-31 NOTE — Telephone Encounter (Signed)
What side effects?, has she tried taking medicine at night or with co-Q-10? lp

## 2022-09-01 NOTE — Progress Notes (Deleted)
[facility] OFFICE PROGRESS NOTE  Lillard Anes, MD [address]  [place] [address]  DIAGNOSIS: Stage IB (T2a, N0, M0) non-small cell lung cancer, adenocarcinoma diagnosed in July 2016  PRIOR THERAPY: 1) Status post right upper lobectomy with lymph node dissection on 07/22/2015 in [place]. 2) Adjuvant systemic chemotherapy with cisplatin and Alimta status post 4 cycles completed in November 2016.  CURRENT THERAPY: Observation.  INTERVAL HISTORY: Victoria Pena 71 y.o. female returns to clinic today for follow-up visit.  The patient was last seen in clinic on 01/28/2021.  The patient was supposed to follow-up in February 2023 she had a restaging CT scan at that time but canceled her follow-up appointment.  The patient is here today to get back on track with her surveillance scans and follow-up appointments.  Since last being seen the patient denies any new changes in her health.  Today she denies any fever, chills, or night sweats.  Weight loss?  She reports dyspnea on exertion smoking?  Denies any cough, hemoptysis.  She is has chronic pain in her substernal area for which she is under chronic pain management by her PCP with hydrocodone and Lyrica and Wellbutrin.  Denies any nausea, vomiting, diarrhea, or constipation denies any headache or visual changes.  She is here today for evaluation and repeat blood work.    MEDICAL HISTORY: Past Medical History:  Diagnosis Date   Acquired spondylolisthesis 05/12/2015   Adenocarcinoma of right lung, stage 1 ([place]) 07/26/2016   Anxiety    Arthropathy of lumbar facet joint 04/11/2019   Formatting of this note might be different from the original. Added automatically from request for surgery [id number]   Atherosclerosis of aorta ([place]) 07/08/2021   Benign essential hypertension 12/27/2016   BMI 30.0-30.9,adult 12/23/2020   COPD (chronic obstructive pulmonary disease) ([place])    pt denies   Current mild episode  of major depressive disorder ([place]) 03/24/2020   Daytime sleepiness 08/03/2021   Depression    Fibromyalgia 03/24/2020   Gastroesophageal reflux disease without esophagitis 03/24/2020   Graves disease 12/27/2016   Hypertension    Osteoporosis 08/03/2021   Pericardial cyst 02/02/2021   Postoperative hypothyroidism 01/17/2015   Reactive airways dysfunction syndrome, unspecified asthma severity, uncomplicated ([place]) 12/21/5850   S/P lobectomy of lung 07/26/2015   Formatting of this note might be different from the original. Right upper   S/P lumbar spinal fusion 05/11/2018   Thyroid disease    Toe ulcer, left, limited to breakdown of skin ([place]) 12/23/2020    ALLERGIES:  is allergic to dextromethorphan hbr, keflex [cephalexin], robaxin [methocarbamol], bactrim [sulfamethoxazole-trimethoprim], hydroxyzine, prednisone & diphenhydramine, amlodipine, ciprofloxacin, dextromethorphan polistirex er, nitrofurantoin, pravastatin, diphenhydramine hcl, moxifloxacin, and prednisone.  MEDICATIONS:  Current Outpatient Medications  Medication Sig Dispense Refill   ALPRAZolam (XANAX) 0.5 MG tablet TAKE 1 TABLET(0.5 MG) BY MOUTH TWICE DAILY AS NEEDED FOR ANXIETY 60 tablet 3   atorvastatin (LIPITOR) 40 MG tablet Take 1 tablet (40 mg total) by mouth daily. 30 tablet 0   buPROPion (WELLBUTRIN XL) 300 MG 24 hr tablet Take 1 tablet (300 mg total) by mouth daily. 90 tablet 2   calcium carbonate (TUMS - DOSED IN MG ELEMENTAL CALCIUM) 500 MG chewable tablet Chew 1 tablet by mouth 2 (two) times daily as needed for indigestion or heartburn.     fluticasone (FLONASE) 50 MCG/ACT nasal spray Place 2 sprays into both nostrils daily. 16 g 6   HYDROcodone-acetaminophen (NORCO) 10-325 MG tablet Take 1  tablet by mouth every 8 (eight) hours as needed for severe pain. 30 tablet 0   ibuprofen (ADVIL,MOTRIN) 200 MG tablet Take 600 mg by mouth 2 (two) times daily as needed for mild pain.     levothyroxine (SYNTHROID) 125 MCG tablet TAKE 1 TABLET(125  MCG) BY MOUTH DAILY 30 tablet 3   losartan (COZAAR) 100 MG tablet TAKE 1 TABLET(100 MG) BY MOUTH DAILY 90 tablet 1   meloxicam (MOBIC) 15 MG tablet TAKE 1 TABLET EVERY DAY 90 tablet 1   metroNIDAZOLE (METROGEL) 0.75 % gel Apply 1 application topically 2 (two) times daily. 45 g 3   omeprazole (PRILOSEC) 40 MG capsule TAKE 1 CAPSULE ONE TIME DAILY BEFORE A MEAL 90 capsule 2   pravastatin (PRAVACHOL) 40 MG tablet Take 1 tablet (40 mg total) by mouth daily. 90 tablet 3   pregabalin (LYRICA) 150 MG capsule TAKE 1 CAPSULE(150 MG) BY MOUTH TWICE DAILY 60 capsule 3   Spacer/Aero-Holding Chambers (AEROCHAMBER MAX W/FLOW-VU) MISC Use as directed. 1 each 0   traMADol (ULTRAM) 50 MG tablet TAKE 1 TABLET(50 MG) BY MOUTH FOUR TIMES DAILY 120 tablet 3   No current facility-administered medications for this visit.    SURGICAL HISTORY:  Past Surgical History:  Procedure Laterality Date   ANKLE SURGERY     bilateral   APPENDECTOMY     BREAST BIOPSY Left 02/25/2020   BREAST BIOPSY Right 08/17/2018   BREAST EXCISIONAL BIOPSY Right    unsure when but marked with scar marker   CHOLECYSTECTOMY     REPLACEMENT TOTAL KNEE Left    THYROIDECTOMY      REVIEW OF SYSTEMS:   Review of Systems  Constitutional: Negative for appetite change, chills, fatigue, fever and unexpected weight change.  HENT:   Negative for mouth sores, nosebleeds, sore throat and trouble swallowing.   Eyes: Negative for eye problems and icterus.  Respiratory: Negative for cough, hemoptysis, shortness of breath and wheezing.   Cardiovascular: Negative for chest pain and leg swelling.  Gastrointestinal: Negative for abdominal pain, constipation, diarrhea, nausea and vomiting.  Genitourinary: Negative for bladder incontinence, difficulty urinating, dysuria, frequency and hematuria.   Musculoskeletal: Negative for back pain, gait problem, neck pain and neck stiffness.  Skin: Negative for itching and rash.  Neurological: Negative for  dizziness, extremity weakness, gait problem, headaches, light-headedness and seizures.  Hematological: Negative for adenopathy. Does not bruise/bleed easily.  Psychiatric/Behavioral: Negative for confusion, depression and sleep disturbance. The patient is not nervous/anxious.     PHYSICAL EXAMINATION:  There were no vitals taken for this visit.  ECOG PERFORMANCE STATUS: {CHL ONC ECOG [id number]  Physical Exam  Constitutional: Oriented to person, place, and time and well-developed, well-nourished, and in no distress. No distress.  HENT:  Head: Normocephalic and atraumatic.  Mouth/Throat: Oropharynx is clear and moist. No oropharyngeal exudate.  Eyes: Conjunctivae are normal. Right eye exhibits no discharge. Left eye exhibits no discharge. No scleral icterus.  Neck: Normal range of motion. Neck supple.  Cardiovascular: Normal rate, regular rhythm, normal heart sounds and intact distal pulses.   Pulmonary/Chest: Effort normal and breath sounds normal. No respiratory distress. No wheezes. No rales.  Abdominal: Soft. Bowel sounds are normal. Exhibits no distension and no mass. There is no tenderness.  Musculoskeletal: Normal range of motion. Exhibits no edema.  Lymphadenopathy:    No cervical adenopathy.  Neurological: Alert and oriented to person, place, and time. Exhibits normal muscle tone. Gait normal. Coordination normal.  Skin: Skin is warm and dry. No  rash noted. Not diaphoretic. No erythema. No pallor.  Psychiatric: Mood, memory and judgment normal.  Vitals reviewed.  LABORATORY DATA: Lab Results  Component Value Date   WBC 4.8 08/10/2022   HGB 13.9 08/10/2022   HCT 42.9 08/10/2022   MCV 90 08/10/2022   PLT 263 08/10/2022      Chemistry      Component Value Date/Time   NA 142 08/10/2022 1056   NA 140 11/22/2017 1451   K 4.4 08/10/2022 1056   K 3.9 11/22/2017 1451   CL 107 (H) 08/10/2022 1056   CO2 22 08/10/2022 1056   CO2 26 11/22/2017 1451   BUN 15 08/10/2022  1056   BUN 14.5 11/22/2017 1451   CREATININE 0.71 08/10/2022 1056   CREATININE 0.74 01/23/2021 1453   CREATININE 0.8 11/22/2017 1451      Component Value Date/Time   CALCIUM 7.8 (L) 08/10/2022 1056   CALCIUM 9.1 11/22/2017 1451   ALKPHOS 148 (H) 08/10/2022 1056   ALKPHOS 122 11/22/2017 1451   AST 11 08/10/2022 1056   AST 13 (L) 01/23/2021 1453   AST 13 11/22/2017 1451   ALT 11 08/10/2022 1056   ALT 12 01/23/2021 1453   ALT 13 11/22/2017 1451   BILITOT 0.5 08/10/2022 1056   BILITOT 0.4 01/23/2021 1453   BILITOT 0.48 11/22/2017 1451       RADIOGRAPHIC STUDIES:  No results found.   ASSESSMENT/PLAN:  This is a very pleasant 71 year old Caucasian female diagnosed with stage Ib non-small cell lung cancer, adenocarcinoma.  She was diagnosed in 2016.  She is status post a right upper lobectomy lymph node dissection followed by 4 cycles of adjuvant systemic chemotherapy with cisplatin and Alimta.  She has been on observation since November 2016.  The patient was last seen in February 2022.  She had a restaging CT scan in February 2023.  Dr. Julien Nordmann personally independently reviewed the scan results and discussed results with the patient today.  The scan showed ***  Dr. Julien Nordmann recommends that the patient get back on track with her surveillance scans.  She is due for a restaging scan in February 2024.  Referral to pulmonary medicine?  Smoking?  We will see her back in February 2024 to review her scan results.  The patient was advised to call immediately if she has any concerning symptoms in the interval. The patient voices understanding of current disease status and treatment options and is in agreement with the current care plan. All questions were answered. The patient knows to call the clinic with any problems, questions or concerns. We can certainly see the patient much sooner if necessary       No orders of the defined types were placed in this encounter.    I  spent {CHL ONC TIME VISIT - OFBPZ:[id number]} counseling the patient face to face. The total time spent in the appointment was {CHL ONC TIME VISIT - UMPNT:[id number]}.  Dorcus Riga L Macguire Holsinger, PA-C 09/01/22

## 2022-09-02 ENCOUNTER — Ambulatory Visit: Payer: Medicare PPO | Admitting: Physician Assistant

## 2022-09-03 ENCOUNTER — Telehealth (INDEPENDENT_AMBULATORY_CARE_PROVIDER_SITE_OTHER): Payer: Medicare PPO | Admitting: Nurse Practitioner

## 2022-09-03 ENCOUNTER — Encounter: Payer: Self-pay | Admitting: Nurse Practitioner

## 2022-09-03 VITALS — BP 133/82 | HR 75 | Temp 97.6°F

## 2022-09-03 DIAGNOSIS — U071 COVID-19: Secondary | ICD-10-CM

## 2022-09-03 MED ORDER — NIRMATRELVIR/RITONAVIR (PAXLOVID)TABLET: 3.0000 | ORAL_TABLET | Freq: Two times a day (BID) | ORAL | 0 refills | Status: AC

## 2022-09-03 MED ORDER — PROMETHAZINE-DM 6.25-15 MG/5ML PO SYRP: 5.0000 mL | ORAL_SOLUTION | Freq: Four times a day (QID) | ORAL | 0 refills | Status: DC | PRN

## 2022-09-03 NOTE — Progress Notes (Signed)
Virtual Visit via Telephone Note   This visit type was conducted due to national recommendations for restrictions regarding the COVID-19 Pandemic (e.g. social distancing) in an effort to limit this patient's exposure and mitigate transmission in our community.  Due to her co-morbid illnesses, this patient is at least at moderate risk for complications without adequate follow up.  This format is felt to be most appropriate for this patient at this time.  The patient did not have access to video technology/had technical difficulties with video requiring transitioning to audio format only (telephone).  All issues noted in this document were discussed and addressed.  No physical exam could be performed with this format.  Patient verbally consented to a telehealth visit.   Date:  09/03/2022   ID:  Victoria Pena, DOB Dec 04, 1951, MRN [id number]  Patient Location: Home Provider Location: Office/Clinic  PCP:  Lillard Anes, MD   Evaluation Performed:  Established patient, acute telemedicine visit  Chief Complaint:  COVID-19   History of Present Illness:    Victoria Pena is a 71 y.o. female with Upper respiratory symptoms She complains of  dry, sore throat, fatigue, sinus congestion, headache, and body aches . Denies fever, chills, night sweats or rash. Onset of symptoms was  3 days ago and worsening.She is drinking plenty of fluids.  Past history is significant for lung cancer. Patient is non-smoker. Tested positive for covid this morning. Treatment has included Flonase nasal spray and Coricidin OTC medication. She tells me she was around her great-grand daughter who had URI symptoms. Reports her son and daughter-in-law had COVID-19 last week. Last was around her son 13 days ago.   The patient does have symptoms concerning for COVID-19 infection (fever, chills, cough, or new shortness of breath).    Past Medical History:  Diagnosis Date   Acquired spondylolisthesis 05/12/2015    Adenocarcinoma of right lung, stage 1 ([place]) 07/26/2016   Anxiety    Arthropathy of lumbar facet joint 04/11/2019   Formatting of this note might be different from the original. Added automatically from request for surgery [id number]   Atherosclerosis of aorta ([place]) 07/08/2021   Benign essential hypertension 12/27/2016   BMI 30.0-30.9,adult 12/23/2020   COPD (chronic obstructive pulmonary disease) ([place])    pt denies   Current mild episode of major depressive disorder ([place]) 03/24/2020   Daytime sleepiness 08/03/2021   Depression    Fibromyalgia 03/24/2020   Gastroesophageal reflux disease without esophagitis 03/24/2020   Graves disease 12/27/2016   Hypertension    Osteoporosis 08/03/2021   Pericardial cyst 02/02/2021   Postoperative hypothyroidism 01/17/2015   Reactive airways dysfunction syndrome, unspecified asthma severity, uncomplicated ([place]) 06/27/6282   S/P lobectomy of lung 07/26/2015   Formatting of this note might be different from the original. Right upper   S/P lumbar spinal fusion 05/11/2018   Thyroid disease    Toe ulcer, left, limited to breakdown of skin ([place]) 12/23/2020    Past Surgical History:  Procedure Laterality Date   ANKLE SURGERY     bilateral   APPENDECTOMY     BREAST BIOPSY Left 02/25/2020   BREAST BIOPSY Right 08/17/2018   BREAST EXCISIONAL BIOPSY Right    unsure when but marked with scar marker   CHOLECYSTECTOMY     REPLACEMENT TOTAL KNEE Left    THYROIDECTOMY      Family History  Problem Relation Age of Onset   Cancer Mother    Cancer Father    Cancer Brother  Autism Grandson     Social History   Socioeconomic History   Marital status: Widowed    Spouse name: Not on file   Number of children: Not on file   Years of education: Not on file   Highest education level: Not on file  Occupational History   Not on file  Tobacco Use   Smoking status: Former    Packs/day: 1.50    Years: 35.00    Total pack years: 52.50    Types: Cigarettes    Quit date: 07/27/1999     Years since quitting: 23.1   Smokeless tobacco: Never  Vaping Use   Vaping Use: Never used  Substance and Sexual Activity   Alcohol use: No   Drug use: No   Sexual activity: Not Currently  Other Topics Concern   Not on file  Social History Narrative   Not on file   Social Determinants of Health   Financial Resource Strain: Low Risk  (06/02/2022)   Overall Financial Resource Strain (CARDIA)    Difficulty of Paying Living Expenses: Not very hard  Food Insecurity: No Food Insecurity (04/27/2022)   Hunger Vital Sign    Worried About Running Out of Food in the Last Year: Never true    Ran Out of Food in the Last Year: Never true  Transportation Needs: No Transportation Needs (06/02/2022)   PRAPARE - Hydrologist (Medical): No    Lack of Transportation (Non-Medical): No  Physical Activity: Sufficiently Active (04/27/2022)   Exercise Vital Sign    Days of Exercise per Week: 5 days    Minutes of Exercise per Session: 30 min  Stress: No Stress Concern Present (04/27/2022)   [place]    Feeling of Stress : Not at all  Social Connections: Socially Isolated (04/27/2022)   Social Connection and Isolation Panel [NHANES]    Frequency of Communication with Friends and Family: More than three times a week    Frequency of Social Gatherings with Friends and Family: Twice a week    Attends Religious Services: Never    Marine scientist or Organizations: Not on file    Attends Archivist Meetings: Never    Marital Status: Widowed  Intimate Partner Violence: Not At Risk (04/27/2022)   Humiliation, Afraid, Rape, and Kick questionnaire    Fear of Current or Ex-Partner: No    Emotionally Abused: No    Physically Abused: No    Sexually Abused: No    Outpatient Medications Prior to Visit  Medication Sig Dispense Refill   ALPRAZolam (XANAX) 0.5 MG tablet TAKE 1 TABLET(0.5 MG) BY MOUTH TWICE  DAILY AS NEEDED FOR ANXIETY 60 tablet 3   atorvastatin (LIPITOR) 40 MG tablet Take 1 tablet (40 mg total) by mouth daily. 30 tablet 0   buPROPion (WELLBUTRIN XL) 300 MG 24 hr tablet Take 1 tablet (300 mg total) by mouth daily. 90 tablet 2   calcium carbonate (TUMS - DOSED IN MG ELEMENTAL CALCIUM) 500 MG chewable tablet Chew 1 tablet by mouth 2 (two) times daily as needed for indigestion or heartburn.     fluticasone (FLONASE) 50 MCG/ACT nasal spray Place 2 sprays into both nostrils daily. 16 g 6   HYDROcodone-acetaminophen (NORCO) 10-325 MG tablet Take 1 tablet by mouth every 8 (eight) hours as needed for severe pain. 30 tablet 0   ibuprofen (ADVIL,MOTRIN) 200 MG tablet Take 600 mg by mouth 2 (two)  times daily as needed for mild pain.     levothyroxine (SYNTHROID) 125 MCG tablet TAKE 1 TABLET(125 MCG) BY MOUTH DAILY 30 tablet 3   losartan (COZAAR) 100 MG tablet TAKE 1 TABLET(100 MG) BY MOUTH DAILY 90 tablet 1   meloxicam (MOBIC) 15 MG tablet TAKE 1 TABLET EVERY DAY 90 tablet 1   metroNIDAZOLE (METROGEL) 0.75 % gel Apply 1 application topically 2 (two) times daily. 45 g 3   omeprazole (PRILOSEC) 40 MG capsule TAKE 1 CAPSULE ONE TIME DAILY BEFORE A MEAL 90 capsule 2   pravastatin (PRAVACHOL) 40 MG tablet Take 1 tablet (40 mg total) by mouth daily. 90 tablet 3   pregabalin (LYRICA) 150 MG capsule TAKE 1 CAPSULE(150 MG) BY MOUTH TWICE DAILY 60 capsule 3   Spacer/Aero-Holding Chambers (AEROCHAMBER MAX W/FLOW-VU) MISC Use as directed. 1 each 0   traMADol (ULTRAM) 50 MG tablet TAKE 1 TABLET(50 MG) BY MOUTH FOUR TIMES DAILY 120 tablet 3   No facility-administered medications prior to visit.    Allergies:   Dextromethorphan hbr, Keflex [cephalexin], Robaxin [methocarbamol], Bactrim [sulfamethoxazole-trimethoprim], Hydroxyzine, Prednisone & diphenhydramine, Amlodipine, Ciprofloxacin, Dextromethorphan polistirex er, Nitrofurantoin, Pravastatin, Diphenhydramine hcl, Moxifloxacin, and Prednisone   Social  History   Tobacco Use   Smoking status: Former    Packs/day: 1.50    Years: 35.00    Total pack years: 52.50    Types: Cigarettes    Quit date: 07/27/1999    Years since quitting: 23.1   Smokeless tobacco: Never  Vaping Use   Vaping Use: Never used  Substance Use Topics   Alcohol use: No   Drug use: No     ROS  See pertinent positives and negatives per HPI.  Labs/Other Tests and Data Reviewed:    Recent Labs: 08/10/2022: ALT 11; BUN 15; Creatinine, Ser 0.71; Hemoglobin 13.9; Platelets 263; Potassium 4.4; Sodium 142; TSH 2.920   Recent Lipid Panel Lab Results  Component Value Date/Time   CHOL 204 (H) 08/10/2022 10:56 AM   TRIG 72 08/10/2022 10:56 AM   HDL 75 08/10/2022 10:56 AM   CHOLHDL 2.7 08/10/2022 10:56 AM   LDLCALC 116 (H) 08/10/2022 10:56 AM    Wt Readings from Last 3 Encounters:  08/10/22 182 lb (82.6 kg)  06/28/22 185 lb (83.9 kg)  06/01/22 185 lb (83.9 kg)     Objective:    Vital Signs:  LMP  (LMP Unknown)    Physical Exam No physical exam due to telemedicine visit  ASSESSMENT & PLAN:     1. COVID-19 - nirmatrelvir/ritonavir EUA (PAXLOVID) 20 x 150 MG & 10 x 100MG  TABS; Take 3 tablets by mouth 2 (two) times daily for 5 days. (Take nirmatrelvir 150 mg two tablets twice daily for 5 days and ritonavir 100 mg one tablet twice daily for 5 days) Patient GFR is 91  Dispense: 30 tablet; Refill: 0 - promethazine-dextromethorphan (PROMETHAZINE-DM) 6.25-15 MG/5ML syrup; Take 5 mLs by mouth 4 (four) times daily as needed.  Dispense: 118 mL; Refill: 0     Rest and push fluids Continue Flonase and Coricidin as directed Seek emergency medical care for any severe or concerning symptoms Follow-up as needed    COVID-19 Education: The signs and symptoms of COVID-19 were discussed with the patient and how to seek care for testing (follow up with PCP or arrange E-visit). The importance of social distancing was discussed today.   I spent 15 minutes dedicated to the  care of this patient on the date of this encounter to  include face-to-face time with the patient, as well as: EMR review and prescription medication management.  Follow Up:  In Person prn  Signed,  Jerrell Belfast, DNP  09/03/2022 11:16 am    [place]

## 2022-09-03 NOTE — Telephone Encounter (Signed)
New prescription was sent.

## 2022-09-04 NOTE — Progress Notes (Unsigned)
[facility] HEMATOLOGY-ONCOLOGY TeleHEALTH VISIT PROGRESS NOTE   I connected with Victoria Pena on 09/06/22 at 10:30 AM EDT by telephone and verified that I am speaking with the correct person using two identifiers.  I discussed the limitations, risks, security and privacy concerns of performing an evaluation and management service by telemedicine and the availability of in-person appointments. I also discussed with the patient that there may be a patient responsible charge related to this service. The patient expressed understanding and agreed to proceed.  Other persons participating in the visit and their role in the encounter: None  Patient's location: Home  Provider's location: Office  Lillard Anes, MD [address] [place] [address]  DIAGNOSIS: Stage IB (T2a, N0, M0) non-small cell lung cancer, adenocarcinoma diagnosed in July 2016  PRIOR THERAPY: 1) Status post right upper lobectomy with lymph node dissection on 07/22/2015 in [place]. 2) Adjuvant systemic chemotherapy with cisplatin and Alimta status post 4 cycles completed in November 2016.  CURRENT THERAPY: Observation  INTERVAL HISTORY: Victoria Pena 71 y.o. female and I connected via telephone encounter today.  The patient was last seen in the clinic in February 2022.  She is typically seen on an annual basis.  This past February 2023, the patient did have her annual CT scan but was lost to follow-up for her appointment to review the results. She reports she had a lot of bills to pay and she had some family stress with people's health issues. The patient is here today to get back on track with her routine imaging and follow-up appointments.  Overall, the patient is feeling fair today as she was just diagnosed with COVID-19 a few days ago. However, she is on her 3rd day of paxlovid so she is starting to feel a little better. She was having congestion, headache, fatigue, sore  throat, and body aches when she was diagnosed.   Aside from the COVID-19 infection, she denies changes in her health.  She does still continues to have some chronic pain related to arthritis and fibromyalgia.  Her pain is managed by her PCP with hydrocodone, Lyrica, and Wellbutrin.  She reports her arthritis makes it hard for her to walk as she has a lot of arthritis in her feet.  She also has fusion in her back and has back pain.  The patient reports her baseline dyspnea on exertion, especially if it is humid outside.  She denies any changes with her dyspnea on exertion.  She denies any cough, hemoptysis, or chest pain.  Denies any night sweats, weight loss, or chills.  Denies any nausea, vomiting, diarrhea, or constipation.  Denies any headache or visual changes.   MEDICAL HISTORY: Past Medical History:  Diagnosis Date   Acquired spondylolisthesis 05/12/2015   Adenocarcinoma of right lung, stage 1 ([place]) 07/26/2016   Anxiety    Arthropathy of lumbar facet joint 04/11/2019   Formatting of this note might be different from the original. Added automatically from request for surgery [id number]   Atherosclerosis of aorta ([place]) 07/08/2021   Benign essential hypertension 12/27/2016   BMI 30.0-30.9,adult 12/23/2020   COPD (chronic obstructive pulmonary disease) ([place])    pt denies   Current mild episode of major depressive disorder ([place]) 03/24/2020   Daytime sleepiness 08/03/2021   Depression    Fibromyalgia 03/24/2020   Gastroesophageal reflux disease without esophagitis 03/24/2020   Graves disease 12/27/2016   Hypertension    Osteoporosis 08/03/2021   Pericardial cyst  02/02/2021   Postoperative hypothyroidism 01/17/2015   Reactive airways dysfunction syndrome, unspecified asthma severity, uncomplicated ([place]) 2/99/2426   S/P lobectomy of lung 07/26/2015   Formatting of this note might be different from the original. Right upper   S/P lumbar spinal fusion 05/11/2018   Thyroid disease    Toe ulcer, left, limited to  breakdown of skin ([place]) 12/23/2020    ALLERGIES:  is allergic to keflex [cephalexin], robaxin [methocarbamol], bactrim [sulfamethoxazole-trimethoprim], hydroxyzine, prednisone & diphenhydramine, amlodipine, ciprofloxacin, dextromethorphan polistirex er, nitrofurantoin, pravastatin, diphenhydramine hcl, moxifloxacin, and prednisone.  MEDICATIONS:  Current Outpatient Medications  Medication Sig Dispense Refill   ALPRAZolam (XANAX) 0.5 MG tablet TAKE 1 TABLET(0.5 MG) BY MOUTH TWICE DAILY AS NEEDED FOR ANXIETY 60 tablet 3   atorvastatin (LIPITOR) 40 MG tablet Take 1 tablet (40 mg total) by mouth daily. 30 tablet 0   buPROPion (WELLBUTRIN XL) 300 MG 24 hr tablet Take 1 tablet (300 mg total) by mouth daily. 90 tablet 2   calcium carbonate (TUMS - DOSED IN MG ELEMENTAL CALCIUM) 500 MG chewable tablet Chew 1 tablet by mouth 2 (two) times daily as needed for indigestion or heartburn.     fluticasone (FLONASE) 50 MCG/ACT nasal spray Place 2 sprays into both nostrils daily. 16 g 6   HYDROcodone-acetaminophen (NORCO) 10-325 MG tablet Take 1 tablet by mouth every 8 (eight) hours as needed for severe pain. 30 tablet 0   ibuprofen (ADVIL,MOTRIN) 200 MG tablet Take 600 mg by mouth 2 (two) times daily as needed for mild pain.     levothyroxine (SYNTHROID) 125 MCG tablet TAKE 1 TABLET(125 MCG) BY MOUTH DAILY 30 tablet 3   losartan (COZAAR) 100 MG tablet TAKE 1 TABLET(100 MG) BY MOUTH DAILY 90 tablet 1   meloxicam (MOBIC) 15 MG tablet TAKE 1 TABLET EVERY DAY 90 tablet 1   metroNIDAZOLE (METROGEL) 0.75 % gel Apply 1 application topically 2 (two) times daily. 45 g 3   nirmatrelvir/ritonavir EUA (PAXLOVID) 20 x 150 MG & 10 x 100MG  TABS Take 3 tablets by mouth 2 (two) times daily for 5 days. (Take nirmatrelvir 150 mg two tablets twice daily for 5 days and ritonavir 100 mg one tablet twice daily for 5 days) Patient GFR is 91 30 tablet 0   omeprazole (PRILOSEC) 40 MG capsule TAKE 1 CAPSULE ONE TIME DAILY BEFORE A MEAL 90  capsule 2   pregabalin (LYRICA) 150 MG capsule TAKE 1 CAPSULE(150 MG) BY MOUTH TWICE DAILY 60 capsule 3   promethazine-dextromethorphan (PROMETHAZINE-DM) 6.25-15 MG/5ML syrup Take 5 mLs by mouth 4 (four) times daily as needed. 118 mL 0   Spacer/Aero-Holding Chambers (AEROCHAMBER MAX W/FLOW-VU) MISC Use as directed. 1 each 0   traMADol (ULTRAM) 50 MG tablet TAKE 1 TABLET(50 MG) BY MOUTH FOUR TIMES DAILY 120 tablet 3   No current facility-administered medications for this visit.    SURGICAL HISTORY:  Past Surgical History:  Procedure Laterality Date   ANKLE SURGERY     bilateral   APPENDECTOMY     BREAST BIOPSY Left 02/25/2020   BREAST BIOPSY Right 08/17/2018   BREAST EXCISIONAL BIOPSY Right    unsure when but marked with scar marker   CHOLECYSTECTOMY     REPLACEMENT TOTAL KNEE Left    THYROIDECTOMY      REVIEW OF SYSTEMS:   Review of Systems  Constitutional: Positive for fatigue.  Negative for appetite change, chills, fever and unexpected weight change.  HENT: Still congestion secondary to COVID-19 infection as well as sore throat (  improving). Negative for mouth sores, nosebleeds, sand trouble swallowing.   Eyes: Negative for eye problems and icterus.  Respiratory: Positive for baseline dyspnea on exertion.  Positive for cough secondary to COVID but otherwise denies baseline cough.  Negative for  hemoptysis and wheezing.   Cardiovascular: Negative for chest pain and leg swelling.  Gastrointestinal: Negative for abdominal pain, constipation, diarrhea, nausea and vomiting.  Genitourinary: Negative for bladder incontinence, difficulty urinating, dysuria, frequency and hematuria.   Musculoskeletal: Negative for back pain, gait problem, neck pain and neck stiffness.  Skin: Negative for itching and rash.  Neurological: Negative for dizziness, extremity weakness, gait problem, headaches, light-headedness and seizures.  Hematological: Negative for adenopathy. Does not bruise/bleed easily.   Psychiatric/Behavioral: Negative for confusion, depression and sleep disturbance. The patient is not nervous/anxious.     PHYSICAL EXAMINATION:  There were no vitals taken for this visit.  ECOG PERFORMANCE STATUS: 0  Physical Exam  Constitutional: Oriented to person, place, and time and well-developed, well-nourished, and in no distress. HENT:  Head: Normocephalic and atraumatic.  Eyes: Conjunctivae are normal. Right eye exhibits no discharge. Left eye exhibits no discharge. No scleral icterus.  Psychiatric: Mood, memory and judgment normal.  Vitals reviewed.  LABORATORY DATA: Lab Results  Component Value Date   WBC 4.8 08/10/2022   HGB 13.9 08/10/2022   HCT 42.9 08/10/2022   MCV 90 08/10/2022   PLT 263 08/10/2022      Chemistry      Component Value Date/Time   NA 142 08/10/2022 1056   NA 140 11/22/2017 1451   K 4.4 08/10/2022 1056   K 3.9 11/22/2017 1451   CL 107 (H) 08/10/2022 1056   CO2 22 08/10/2022 1056   CO2 26 11/22/2017 1451   BUN 15 08/10/2022 1056   BUN 14.5 11/22/2017 1451   CREATININE 0.71 08/10/2022 1056   CREATININE 0.74 01/23/2021 1453   CREATININE 0.8 11/22/2017 1451      Component Value Date/Time   CALCIUM 7.8 (L) 08/10/2022 1056   CALCIUM 9.1 11/22/2017 1451   ALKPHOS 148 (H) 08/10/2022 1056   ALKPHOS 122 11/22/2017 1451   AST 11 08/10/2022 1056   AST 13 (L) 01/23/2021 1453   AST 13 11/22/2017 1451   ALT 11 08/10/2022 1056   ALT 12 01/23/2021 1453   ALT 13 11/22/2017 1451   BILITOT 0.5 08/10/2022 1056   BILITOT 0.4 01/23/2021 1453   BILITOT 0.48 11/22/2017 1451       RADIOGRAPHIC STUDIES:  No results found.   ASSESSMENT/PLAN:  This is a very pleasant 71 year old Caucasian female with a history of stage Ib non-small cell lung cancer, adenocarcinoma.  She was diagnosed in 2016.  She is status post right upper lobectomy with lymph node dissection in 2016.  This was followed by 4 cycles of adjuvant systemic chemotherapy with  cisplatin and Alimta.  The patient's most recent CT scan was in February 2023.  She was lost to follow-up since February 2022 due to health concerns with herself and family members.  The patient was seen with Dr. Julien Nordmann today.  Dr. Julien Nordmann reviewed her CT scan from February 2023 which did not show any evidence of disease recurrence.  Dr. Julien Nordmann recommends that she continue on observation with a repeat CT scan of the chest in February 2024.  We will see the patient back for follow-up visit a few days after her CT scan to review the results.  Pericardial cyst that is incidentally noted on scans appear stable in size.  We will continue to monitor this  She will continue to follow with her family provider regarding her fibromyalgia and arthritis.  They are managing her pain regimen with Lyrica, Wellbutrin, and hydrocodone.  I discussed the assessment and treatment plan with the patient. The patient was provided an opportunity to ask questions and all were answered. The patient agreed with the plan and demonstrated an understanding of the instructions.  The patient was advised to call back or seek an in-person evaluation if the symptoms worsen or if the condition fails to improve as anticipated.  I provided 15 minutes of face-to-face video visit time during this encounter, and > 50% was spent counseling as documented under my assessment & plan.  Alvan Culpepper L Halen Mossbarger, PA-C 09/06/2022 11:08 AM  Orders Placed This Encounter  Procedures   CT Chest W Contrast    Standing Status:   Future    Standing Expiration Date:   09/06/2023    Order Specific Question:   If indicated for the ordered procedure, I authorize the administration of contrast media per Radiology protocol    Answer:   Yes    Order Specific Question:   Preferred imaging location?    Answer:   [facility]   CBC with Differential ([place] Only)    Standing Status:   Future    Standing Expiration Date:   09/07/2023    CMP ([place] only)    Standing Status:   Future    Standing Expiration Date:   09/07/2023     Tobe Sos Tyria Springer, PA-C 09/06/22  ADDENDUM: Hematology/Oncology Attending: I had a face-to-face encounter with the patient today.  I reviewed his record, lab, scan and recommended her care plan.  This is a very pleasant 71 years old white female with a stage Ib non-small cell lung cancer, adenocarcinoma diagnosed in 2016 status post right upper lobectomy with lymph node dissection followed by 4 cycles of adjuvant systemic chemotherapy with cisplatin and Alimta and she has been on observation since that time. The patient had repeat CT scan of the chest performed in February 2023 and she was supposed to have follow-up visit with [place] at that time but she missed her appointment.  She had COVID-19 few days ago and currently on treatment with Paxlovid. The patient has the appointment today for reevaluation and discussion of her previous scan and also future follow-up visit. Her last scan showed no concerning findings for disease progression. I recommended for her to continue on observation with repeat CT scan of the chest in 6 months. She was advised to call immediately if she has any other concerning issues in the interval. The total time spent in the appointment was 15 minutes.  Disclaimer: This note was dictated with voice recognition software. Similar sounding words can inadvertently be transcribed and may be missed upon review. Eilleen Kempf, MD

## 2022-09-06 ENCOUNTER — Inpatient Hospital Stay: Payer: Medicare PPO | Attending: Physician Assistant | Admitting: Physician Assistant

## 2022-09-06 ENCOUNTER — Encounter: Payer: Self-pay | Admitting: Physician Assistant

## 2022-09-06 DIAGNOSIS — Z902 Acquired absence of lung [part of]: Secondary | ICD-10-CM | POA: Insufficient documentation

## 2022-09-06 DIAGNOSIS — Z85118 Personal history of other malignant neoplasm of bronchus and lung: Secondary | ICD-10-CM | POA: Diagnosis not present

## 2022-09-06 DIAGNOSIS — Z9221 Personal history of antineoplastic chemotherapy: Secondary | ICD-10-CM | POA: Diagnosis not present

## 2022-09-06 DIAGNOSIS — C3491 Malignant neoplasm of unspecified part of right bronchus or lung: Secondary | ICD-10-CM

## 2022-09-13 DIAGNOSIS — M67431 Ganglion, right wrist: Secondary | ICD-10-CM | POA: Diagnosis not present

## 2022-09-13 DIAGNOSIS — M7989 Other specified soft tissue disorders: Secondary | ICD-10-CM | POA: Diagnosis not present

## 2022-09-13 DIAGNOSIS — M189 Osteoarthritis of first carpometacarpal joint, unspecified: Secondary | ICD-10-CM | POA: Diagnosis not present

## 2022-09-15 ENCOUNTER — Inpatient Hospital Stay: Admission: RE | Admit: 2022-09-15 | Payer: Medicare HMO | Source: Ambulatory Visit

## 2022-09-16 DIAGNOSIS — M19041 Primary osteoarthritis, right hand: Secondary | ICD-10-CM | POA: Diagnosis not present

## 2022-09-17 ENCOUNTER — Telehealth: Payer: Self-pay

## 2022-09-17 NOTE — Telephone Encounter (Signed)
Victoria Pena called to report that she is not tolerating the hydrocodone very well.  She has been experiencing nausea and headache.  She took xanax after the hydrocodone and that seem to help her symptoms.  She also has tramadol and meloxicam.  She was instructed to hold the hydrocodone.  Follow-up appointment scheduled on Tuesday with Dr. Henrene Pastor.

## 2022-09-21 ENCOUNTER — Encounter: Payer: Self-pay | Admitting: Legal Medicine

## 2022-09-21 ENCOUNTER — Ambulatory Visit (INDEPENDENT_AMBULATORY_CARE_PROVIDER_SITE_OTHER): Payer: Medicare PPO | Admitting: Legal Medicine

## 2022-09-21 VITALS — BP 140/80 | HR 91 | Temp 98.2°F | Resp 14 | Ht 63.0 in | Wt 179.0 lb

## 2022-09-21 DIAGNOSIS — M797 Fibromyalgia: Secondary | ICD-10-CM

## 2022-09-21 DIAGNOSIS — Z981 Arthrodesis status: Secondary | ICD-10-CM

## 2022-09-21 DIAGNOSIS — M533 Sacrococcygeal disorders, not elsewhere classified: Secondary | ICD-10-CM | POA: Diagnosis not present

## 2022-09-21 DIAGNOSIS — I10 Essential (primary) hypertension: Secondary | ICD-10-CM | POA: Diagnosis not present

## 2022-09-21 DIAGNOSIS — N3001 Acute cystitis with hematuria: Secondary | ICD-10-CM | POA: Diagnosis not present

## 2022-09-21 LAB — POCT URINALYSIS DIP (CLINITEK)
Blood, UA: NEGATIVE
Glucose, UA: NEGATIVE mg/dL
Ketones, POC UA: NEGATIVE mg/dL
Nitrite, UA: NEGATIVE
Spec Grav, UA: 1.02 (ref 1.010–1.025)
Urobilinogen, UA: 0.2 E.U./dL
pH, UA: 6.5 (ref 5.0–8.0)

## 2022-09-21 MED ORDER — OXYCODONE-ACETAMINOPHEN 10-325 MG PO TABS: 1.0000 | ORAL_TABLET | Freq: Three times a day (TID) | ORAL | 0 refills | Status: DC | PRN

## 2022-09-21 MED ORDER — AZITHROMYCIN 250 MG PO TABS: ORAL_TABLET | ORAL | 0 refills | Status: AC

## 2022-09-21 NOTE — Progress Notes (Signed)
Subjective:  Patient ID: Victoria Pena, female    DOB: 06/01/1951  Age: 71 y.o. MRN: [id number]  Chief Complaint  Patient presents with   Neck Pain   Hypertension    HPI   Patient has been feeling unwell since one week ago. She mentioned pain on right sided of her jaw with radiation to the neck. She stopped taking hydrocodone because she felt anxious, headache, high blood pressure.  Depression PHQ 23.  Gets numb sitting down  Fell 2 weeks ago and pain in coccyx and hips.  Refer to dr. Ronnald Ramp surgeon.  Trouble walking without a cane.  Not going to work history of spinal stenosis and lumbar fusion 2019.pain8/10 unable to tolerate hydrocodone, can tolerate oxycodone. Fibromyalgia I acting up.  Pain right scm muscle, full ROM neck.  Taking BP medicines but high BP probably from pain.  Current Outpatient Medications on File Prior to Visit  Medication Sig Dispense Refill   ALPRAZolam (XANAX) 0.5 MG tablet TAKE 1 TABLET(0.5 MG) BY MOUTH TWICE DAILY AS NEEDED FOR ANXIETY 60 tablet 3   atorvastatin (LIPITOR) 40 MG tablet Take 1 tablet (40 mg total) by mouth daily. 30 tablet 0   calcium carbonate (TUMS - DOSED IN MG ELEMENTAL CALCIUM) 500 MG chewable tablet Chew 1 tablet by mouth 2 (two) times daily as needed for indigestion or heartburn.     fluticasone (FLONASE) 50 MCG/ACT nasal spray Place 2 sprays into both nostrils daily. 16 g 6   ibuprofen (ADVIL,MOTRIN) 200 MG tablet Take 600 mg by mouth 2 (two) times daily as needed for mild pain.     levothyroxine (SYNTHROID) 125 MCG tablet TAKE 1 TABLET(125 MCG) BY MOUTH DAILY 30 tablet 3   losartan (COZAAR) 100 MG tablet TAKE 1 TABLET(100 MG) BY MOUTH DAILY 90 tablet 1   meloxicam (MOBIC) 15 MG tablet TAKE 1 TABLET EVERY DAY 90 tablet 1   metroNIDAZOLE (METROGEL) 0.75 % gel Apply 1 application topically 2 (two) times daily. 45 g 3   omeprazole (PRILOSEC) 40 MG capsule TAKE 1 CAPSULE ONE TIME DAILY BEFORE A MEAL 90 capsule 2   pregabalin (LYRICA)  150 MG capsule TAKE 1 CAPSULE(150 MG) BY MOUTH TWICE DAILY 60 capsule 3   promethazine-dextromethorphan (PROMETHAZINE-DM) 6.25-15 MG/5ML syrup Take 5 mLs by mouth 4 (four) times daily as needed. 118 mL 0   Spacer/Aero-Holding Chambers (AEROCHAMBER MAX W/FLOW-VU) MISC Use as directed. 1 each 0   traMADol (ULTRAM) 50 MG tablet TAKE 1 TABLET(50 MG) BY MOUTH FOUR TIMES DAILY 120 tablet 3   No current facility-administered medications on file prior to visit.   Past Medical History:  Diagnosis Date   Acquired spondylolisthesis 05/12/2015   Adenocarcinoma of right lung, stage 1 ([place]) 07/26/2016   Anxiety    Arthropathy of lumbar facet joint 04/11/2019   Formatting of this note might be different from the original. Added automatically from request for surgery [id number]   Atherosclerosis of aorta ([place]) 07/08/2021   Benign essential hypertension 12/27/2016   BMI 30.0-30.9,adult 12/23/2020   COPD (chronic obstructive pulmonary disease) ([place])    pt denies   Current mild episode of major depressive disorder ([place]) 03/24/2020   Daytime sleepiness 08/03/2021   Depression    Fibromyalgia 03/24/2020   Gastroesophageal reflux disease without esophagitis 03/24/2020   Graves disease 12/27/2016   Hypertension    Osteoporosis 08/03/2021   Pericardial cyst 02/02/2021   Postoperative hypothyroidism 01/17/2015   Reactive airways dysfunction syndrome, unspecified asthma severity, uncomplicated ([place]) 3/50/0938  S/P lobectomy of lung 07/26/2015   Formatting of this note might be different from the original. Right upper   S/P lumbar spinal fusion 05/11/2018   Thyroid disease    Toe ulcer, left, limited to breakdown of skin ([place]) 12/23/2020   Past Surgical History:  Procedure Laterality Date   ANKLE SURGERY     bilateral   APPENDECTOMY     BREAST BIOPSY Left 02/25/2020   BREAST BIOPSY Right 08/17/2018   BREAST EXCISIONAL BIOPSY Right    unsure when but marked with scar marker   CHOLECYSTECTOMY     REPLACEMENT TOTAL KNEE Left     THYROIDECTOMY      Family History  Problem Relation Age of Onset   Cancer Mother    Cancer Father    Cancer Brother    Autism Grandson    Social History   Socioeconomic History   Marital status: Widowed    Spouse name: Not on file   Number of children: Not on file   Years of education: Not on file   Highest education level: Not on file  Occupational History   Not on file  Tobacco Use   Smoking status: Former    Packs/day: 1.50    Years: 35.00    Total pack years: 52.50    Types: Cigarettes    Quit date: 07/27/1999    Years since quitting: 23.1   Smokeless tobacco: Never  Vaping Use   Vaping Use: Never used  Substance and Sexual Activity   Alcohol use: No   Drug use: No   Sexual activity: Not Currently  Other Topics Concern   Not on file  Social History Narrative   Not on file   Social Determinants of Health   Financial Resource Strain: Low Risk  (06/02/2022)   Overall Financial Resource Strain (CARDIA)    Difficulty of Paying Living Expenses: Not very hard  Food Insecurity: No Food Insecurity (04/27/2022)   Hunger Vital Sign    Worried About Running Out of Food in the Last Year: Never true    Ran Out of Food in the Last Year: Never true  Transportation Needs: No Transportation Needs (06/02/2022)   PRAPARE - Hydrologist (Medical): No    Lack of Transportation (Non-Medical): No  Physical Activity: Sufficiently Active (04/27/2022)   Exercise Vital Sign    Days of Exercise per Week: 5 days    Minutes of Exercise per Session: 30 min  Stress: No Stress Concern Present (04/27/2022)   [place]    Feeling of Stress : Not at all  Social Connections: Socially Isolated (04/27/2022)   Social Connection and Isolation Panel [NHANES]    Frequency of Communication with Friends and Family: More than three times a week    Frequency of Social Gatherings with Friends and Family: Twice a week     Attends Religious Services: Never    Marine scientist or Organizations: Not on file    Attends Archivist Meetings: Never    Marital Status: Widowed    Review of Systems  Constitutional:  Negative for activity change and appetite change.  HENT:  Negative for congestion.   Eyes:  Negative for visual disturbance.  Respiratory:  Negative for chest tightness.   Cardiovascular:  Negative for chest pain and palpitations.  Gastrointestinal:  Negative for abdominal distention and abdominal pain.  Genitourinary: Negative.   Musculoskeletal:  Positive for arthralgias, back pain,  neck pain and neck stiffness.  Skin: Negative.   Neurological: Negative.   Psychiatric/Behavioral: Negative.       Objective:  BP (!) 140/80   Pulse 91   Temp 98.2 F (36.8 C)   Resp 14   Ht 5\' 3"  (1.6 m)   Wt 179 lb (81.2 kg)   LMP  (LMP Unknown)   SpO2 97%   BMI 31.71 kg/m      09/21/2022   11:00 AM 09/03/2022    9:45 AM 08/10/2022   10:14 AM  BP/Weight  Systolic BP 048 889 169  Diastolic BP 80 82 80  Wt. (Lbs) 179  182  BMI 31.71 kg/m2  32.24 kg/m2    Physical Exam Vitals reviewed.  Constitutional:      Appearance: She is ill-appearing.  HENT:     Head: Normocephalic and atraumatic.     Right Ear: Tympanic membrane normal.     Left Ear: Tympanic membrane normal.     Nose: Nose normal.     Mouth/Throat:     Mouth: Mucous membranes are moist.     Pharynx: Oropharynx is clear.  Eyes:     Extraocular Movements: Extraocular movements intact.     Conjunctiva/sclera: Conjunctivae normal.     Pupils: Pupils are equal, round, and reactive to light.  Cardiovascular:     Rate and Rhythm: Normal rate and regular rhythm.     Pulses: Normal pulses.     Heart sounds: Normal heart sounds. No murmur heard.    No gallop.  Pulmonary:     Effort: Pulmonary effort is normal. No respiratory distress.     Breath sounds: No wheezing.  Abdominal:     General: Abdomen is flat. Bowel  sounds are normal. There is no distension.     Palpations: Abdomen is soft.     Tenderness: There is no abdominal tenderness.  Musculoskeletal:        General: Tenderness present.     Cervical back: Tenderness present.     Right lower leg: No edema.     Left lower leg: No edema.     Comments: Pain coccygeal area and buttocks.  Skin:    General: Skin is warm.     Capillary Refill: Capillary refill takes less than 2 seconds.  Neurological:     General: No focal deficit present.     Mental Status: She is alert and oriented to person, place, and time.     Gait: Gait abnormal.         Lab Results  Component Value Date   WBC 4.8 08/10/2022   HGB 13.9 08/10/2022   HCT 42.9 08/10/2022   PLT 263 08/10/2022   GLUCOSE 92 08/10/2022   CHOL 204 (H) 08/10/2022   TRIG 72 08/10/2022   HDL 75 08/10/2022   LDLCALC 116 (H) 08/10/2022   ALT 11 08/10/2022   AST 11 08/10/2022   NA 142 08/10/2022   K 4.4 08/10/2022   CL 107 (H) 08/10/2022   CREATININE 0.71 08/10/2022   BUN 15 08/10/2022   CO2 22 08/10/2022   TSH 2.920 08/10/2022   INR 0.95 05/03/2018      Assessment & Plan:   Problem List Items Addressed This Visit       Cardiovascular and Mediastinum   Benign essential hypertension An individual hypertension care plan was established and reinforced today.  The patient's status was assessed using clinical findings on exam and labs or diagnostic tests. The patient's success at meeting treatment  goals on disease specific evidence-based guidelines and found to be well controlled. SELF MANAGEMENT: The patient and I together assessed ways to personally work towards obtaining the recommended goals. RECOMMENDATIONS: avoid decongestants found in common cold remedies, decrease consumption of alcohol, perform routine monitoring of BP with home BP cuff, exercise, reduction of dietary salt, take medicines as prescribed, try not to miss doses and quit smoking.  Regular exercise and maintaining a  healthy weight is needed.  Stress reduction may help. A CLINICAL SUMMARY including written plan identify barriers to care unique to individual due to social or financial issues.  We attempt to mutually creat solutions for individual and family understanding.      Other   S/P lumbar spinal fusion - Primary Patient has had fusion surgery on back    Fibromyalgia   Relevant Medications   oxyCODONE-acetaminophen (PERCOCET) 10-325 MG tablet Patient has chronic fibromyalgia   Other Visit Diagnoses     Coccydynia       Relevant Medications   oxyCODONE-acetaminophen (PERCOCET) 10-325 MG tablet   Other Relevant Orders   DG Sacrum/Coccyx Patient is having severe coccyx pain and needs x-ray     Acute cystitis with hematuria       Relevant Medications   azithromycin (ZITHROMAX) 250 MG tablet   Other Relevant Orders   POCT URINALYSIS DIP (CLINITEK) (Completed)   Urine Culture Treat cystitis with z-ack due to multiple antibiotic allergies     .A total of 40 minutes were spent face-to-face with the patient during this encounter and over half of that time was spent on counseling and coordination of care.    Meds ordered this encounter  Medications   oxyCODONE-acetaminophen (PERCOCET) 10-325 MG tablet    Sig: Take 1 tablet by mouth every 8 (eight) hours as needed for pain.    Dispense:  90 tablet    Refill:  0   azithromycin (ZITHROMAX) 250 MG tablet    Sig: Take 2 tablets on day 1, then 1 tablet daily on days 2 through 5    Dispense:  6 tablet    Refill:  0    Orders Placed This Encounter  Procedures   Urine Culture   DG Sacrum/Coccyx   POCT URINALYSIS DIP (CLINITEK)     Follow-up: Return in about 1 week (around 09/28/2022), or pain.  An After Visit Summary was printed and given to the patient.  Reinaldo Meeker, MD [facility] [phone]

## 2022-09-23 LAB — URINE CULTURE

## 2022-09-23 NOTE — Progress Notes (Signed)
Urine culture e. Coli lp

## 2022-09-24 ENCOUNTER — Other Ambulatory Visit: Payer: Self-pay | Admitting: Primary Care

## 2022-09-24 DIAGNOSIS — J441 Chronic obstructive pulmonary disease with (acute) exacerbation: Secondary | ICD-10-CM

## 2022-09-28 ENCOUNTER — Ambulatory Visit: Payer: Medicare PPO | Admitting: Legal Medicine

## 2022-09-28 ENCOUNTER — Other Ambulatory Visit: Payer: Self-pay

## 2022-09-28 DIAGNOSIS — M533 Sacrococcygeal disorders, not elsewhere classified: Secondary | ICD-10-CM

## 2022-10-04 ENCOUNTER — Other Ambulatory Visit: Payer: Self-pay | Admitting: Legal Medicine

## 2022-10-07 DIAGNOSIS — M5416 Radiculopathy, lumbar region: Secondary | ICD-10-CM | POA: Diagnosis not present

## 2022-10-07 DIAGNOSIS — Z6830 Body mass index (BMI) 30.0-30.9, adult: Secondary | ICD-10-CM | POA: Diagnosis not present

## 2022-10-08 ENCOUNTER — Other Ambulatory Visit: Payer: Self-pay | Admitting: Legal Medicine

## 2022-10-08 ENCOUNTER — Telehealth: Payer: Self-pay

## 2022-10-08 DIAGNOSIS — C3491 Malignant neoplasm of unspecified part of right bronchus or lung: Secondary | ICD-10-CM

## 2022-10-08 NOTE — Telephone Encounter (Signed)
Victoria Pena called to report that she stopped the trintillix after taking the medication for two days.  She did not like the way that she felt while taking the medication (irritable, excessive crying).  She has welbutrin 150 on hand and she has restarted that medication.  She has an appointment with Dr. Henrene Pastor for next week to discuss her medication

## 2022-10-11 ENCOUNTER — Other Ambulatory Visit: Payer: Self-pay | Admitting: Student

## 2022-10-11 DIAGNOSIS — M5416 Radiculopathy, lumbar region: Secondary | ICD-10-CM

## 2022-10-12 ENCOUNTER — Ambulatory Visit (INDEPENDENT_AMBULATORY_CARE_PROVIDER_SITE_OTHER): Payer: Medicare PPO | Admitting: Legal Medicine

## 2022-10-12 ENCOUNTER — Encounter: Payer: Self-pay | Admitting: Legal Medicine

## 2022-10-12 VITALS — BP 120/70 | HR 79 | Temp 97.6°F | Ht 64.0 in | Wt 186.2 lb

## 2022-10-12 DIAGNOSIS — F324 Major depressive disorder, single episode, in partial remission: Secondary | ICD-10-CM | POA: Diagnosis not present

## 2022-10-12 DIAGNOSIS — E782 Mixed hyperlipidemia: Secondary | ICD-10-CM | POA: Diagnosis not present

## 2022-10-12 DIAGNOSIS — I700 Atherosclerosis of aorta: Secondary | ICD-10-CM | POA: Diagnosis not present

## 2022-10-12 DIAGNOSIS — Z23 Encounter for immunization: Secondary | ICD-10-CM | POA: Diagnosis not present

## 2022-10-12 DIAGNOSIS — K219 Gastro-esophageal reflux disease without esophagitis: Secondary | ICD-10-CM | POA: Diagnosis not present

## 2022-10-12 MED ORDER — ARIPIPRAZOLE 5 MG PO TABS: 5.0000 mg | ORAL_TABLET | Freq: Every day | ORAL | 3 refills | Status: DC

## 2022-10-12 MED ORDER — PANTOPRAZOLE SODIUM 40 MG PO TBEC: 40.0000 mg | DELAYED_RELEASE_TABLET | Freq: Every day | ORAL | 2 refills | Status: DC

## 2022-10-12 NOTE — Progress Notes (Signed)
Established Patient Office Visit  Subjective   Patient ID: Victoria Pena, female    DOB: 15-Jan-1951  Age: 71 y.o. MRN: [id number]  Chief Complaint  Patient presents with   Depression    HPI: new antidepressent did not help, she restarted welbutrin. Recent death in family. She still is depressed.  PHQ 9 11. We discussed death. Add aripiprazole  She is having worse GERd, change back to pantoprazole.   Review of Systems  Constitutional: Negative.  Negative for chills and fever.  HENT:  Negative for ear discharge and hearing loss.   Eyes:  Negative for redness.  Respiratory:  Negative for cough and hemoptysis.   Cardiovascular:  Negative for chest pain.  Gastrointestinal:  Negative for heartburn.  Genitourinary:  Negative for dysuria.  Musculoskeletal:  Negative for myalgias and neck pain.  Skin:  Negative for rash.  Neurological: Negative.   Psychiatric/Behavioral:  Positive for depression.       Objective:     BP 120/70 (BP Location: Left Arm, Patient Position: Sitting, Cuff Size: Large)   Pulse 79   Temp 97.6 F (36.4 C) (Temporal)   Ht 5\' 4"  (1.626 m)   Wt 186 lb 3.2 oz (84.5 kg)   LMP  (LMP Unknown)   SpO2 98%   BMI 31.96 kg/m    Physical Exam Vitals reviewed.  Constitutional:      General: She is not in acute distress.    Appearance: Normal appearance.  HENT:     Head: Normocephalic.     Right Ear: Tympanic membrane normal.     Left Ear: Tympanic membrane normal.     Nose: Nose normal.     Mouth/Throat:     Mouth: Mucous membranes are moist.     Pharynx: Oropharynx is clear.  Eyes:     Extraocular Movements: Extraocular movements intact.     Conjunctiva/sclera: Conjunctivae normal.     Pupils: Pupils are equal, round, and reactive to light.  Cardiovascular:     Rate and Rhythm: Normal rate and regular rhythm.     Pulses: Normal pulses.     Heart sounds: Normal heart sounds. No murmur heard.    No gallop.  Pulmonary:     Effort: Pulmonary  effort is normal. No respiratory distress.     Breath sounds: No wheezing.  Abdominal:     General: Abdomen is flat. Bowel sounds are normal. There is no distension.     Palpations: Abdomen is soft.     Tenderness: There is no abdominal tenderness.  Musculoskeletal:        General: Normal range of motion.     Cervical back: Normal range of motion and neck supple.     Right lower leg: No edema.     Left lower leg: No edema.  Skin:    General: Skin is warm.     Capillary Refill: Capillary refill takes less than 2 seconds.  Neurological:     General: No focal deficit present.     Mental Status: She is alert and oriented to person, place, and time. Mental status is at baseline.     Gait: Gait normal.     Deep Tendon Reflexes: Reflexes normal.     Comments: depression       10/12/2022    1:53 PM 10/12/2022    1:48 PM 09/21/2022   11:25 AM 08/10/2022   10:21 AM 04/27/2022    3:05 PM  Depression screen PHQ 2/9  Decreased Interest 3 3  3 2 0  Down, Depressed, Hopeless 2 2 3 2  0  PHQ - 2 Score 5 5 6 4  0  Altered sleeping 0 0 3 0   Tired, decreased energy 2 2 3 3    Change in appetite 0 0 3 0   Feeling bad or failure about yourself  0 0 2 2   Trouble concentrating 3 3 3 1    Moving slowly or fidgety/restless 1 1 3  0   Suicidal thoughts 0 0 0 0   PHQ-9 Score 11 11 23 10    Difficult doing work/chores Not difficult at all Not difficult at all Very difficult Somewhat difficult         The 10-year ASCVD risk score (Arnett DK, et al., 2019) is: 11.9%    Assessment & Plan:   Problem List Items Addressed This Visit       Cardiovascular and Mediastinum   Atherosclerosis of aorta ([place])   Relevant Orders   Lipid panel Patient is on statin     Digestive   Gastroesophageal reflux disease without esophagitis   Relevant Medications   pantoprazole (PROTONIX) 40 MG tablet Plan of care was formulated today.  She is doing well.  A plan of care was formulated using patient exam, tests  and other sources to optimize care using evidence based information.  Recommend no smoking, no eating after supper, avoid fatty foods, elevate Head of bed, avoid tight fitting clothing.  Continue on stopped omeprazole and restarted protonix.      Other   Depression - Primary   Relevant Medications   buPROPion (WELLBUTRIN XL) 150 MG 24 hr tablet   ARIPiprazole (ABILIFY) 5 MG tablet Patient still depressed PHQ 11, start abilify   Other Visit Diagnoses     Mixed hypercholesterolemia and hypertriglyceridemia       Relevant Orders   Comprehensive metabolic panel AN INDIVIDUAL CARE PLAN for hyperlipidemia/ cholesterol was established and reinforced today.  The patient's status was assessed using clinical findings on exam, lab and other diagnostic tests. The patient's disease status was assessed based on evidence-based guidelines and found to be fair controlled. MEDICATIONS were reviewed. SELF MANAGEMENT GOALS have been discussed and patient's success at attaining the goal of low cholesterol was assessed. RECOMMENDATION given include regular exercise 3 days a week and low cholesterol/low fat diet. CLINICAL SUMMARY including written plan to identify barriers unique to the patient due to social or economic  reasons was discussed.    Need for immunization against influenza       Relevant Orders   Flu Vaccine QUAD High Dose(Fluad) (Completed)       Return in one month to check antidepressant    Reinaldo Meeker, MD

## 2022-10-13 LAB — LIPID PANEL
Chol/HDL Ratio: 1.9 ratio (ref 0.0–4.4)
Cholesterol, Total: 154 mg/dL (ref 100–199)
HDL: 82 mg/dL (ref >39)
LDL Chol Calc (NIH): 59 mg/dL (ref 0–99)
Triglycerides: 67 mg/dL (ref 0–149)
VLDL Cholesterol Cal: 13 mg/dL (ref 5–40)

## 2022-10-13 LAB — COMPREHENSIVE METABOLIC PANEL
ALT: 14 IU/L (ref 0–32)
AST: 14 IU/L (ref 0–40)
Albumin/Globulin Ratio: 1.4 (ref 1.2–2.2)
Albumin: 3.9 g/dL (ref 3.8–4.8)
Alkaline Phosphatase: 166 IU/L — ABNORMAL HIGH (ref 44–121)
BUN/Creatinine Ratio: 31 — ABNORMAL HIGH (ref 12–28)
BUN: 22 mg/dL (ref 8–27)
Bilirubin Total: 0.4 mg/dL (ref 0.0–1.2)
CO2: 25 mmol/L (ref 20–29)
Calcium: 9.6 mg/dL (ref 8.7–10.3)
Chloride: 105 mmol/L (ref 96–106)
Creatinine, Ser: 0.7 mg/dL (ref 0.57–1.00)
Globulin, Total: 2.8 g/dL (ref 1.5–4.5)
Glucose: 100 mg/dL — ABNORMAL HIGH (ref 70–99)
Potassium: 4.6 mmol/L (ref 3.5–5.2)
Sodium: 143 mmol/L (ref 134–144)
Total Protein: 6.7 g/dL (ref 6.0–8.5)
eGFR: 92 mL/min/{1.73_m2} (ref >59)

## 2022-10-13 LAB — CARDIOVASCULAR RISK ASSESSMENT

## 2022-10-13 NOTE — Progress Notes (Signed)
Glucose 100, kidney tests normal, liver tests normal, calcium normal, Cholesterol normal,  lp

## 2022-10-21 ENCOUNTER — Ambulatory Visit
Admission: RE | Admit: 2022-10-21 | Discharge: 2022-10-21 | Disposition: A | Discharge status: Home / Self Care | Payer: Medicare PPO | Source: Ambulatory Visit | Attending: Student | Admitting: Student

## 2022-10-21 DIAGNOSIS — M48061 Spinal stenosis, lumbar region without neurogenic claudication: Secondary | ICD-10-CM | POA: Diagnosis not present

## 2022-10-21 DIAGNOSIS — M5416 Radiculopathy, lumbar region: Secondary | ICD-10-CM

## 2022-10-21 DIAGNOSIS — M545 Low back pain, unspecified: Secondary | ICD-10-CM | POA: Diagnosis not present

## 2022-10-26 ENCOUNTER — Other Ambulatory Visit: Payer: Self-pay

## 2022-10-26 ENCOUNTER — Ambulatory Visit (INDEPENDENT_AMBULATORY_CARE_PROVIDER_SITE_OTHER): Payer: Medicare PPO

## 2022-10-26 DIAGNOSIS — Z23 Encounter for immunization: Secondary | ICD-10-CM | POA: Diagnosis not present

## 2022-10-26 MED ORDER — OXYCODONE-ACETAMINOPHEN 10-325 MG PO TABS: 1.0000 | ORAL_TABLET | Freq: Three times a day (TID) | ORAL | 0 refills | Status: DC | PRN

## 2022-10-26 NOTE — Progress Notes (Signed)
Subjective:  Patient ID: Victoria Pena, female    DOB: 05-10-1951  Age: 71 y.o. MRN: [id number]  No chief complaint on file.   HPI   Current Outpatient Medications on File Prior to Visit  Medication Sig Dispense Refill   ALPRAZolam (XANAX) 0.5 MG tablet TAKE 1 TABLET(0.5 MG) BY MOUTH TWICE DAILY AS NEEDED FOR ANXIETY 60 tablet 3   ARIPiprazole (ABILIFY) 5 MG tablet Take 1 tablet (5 mg total) by mouth daily. 30 tablet 3   atorvastatin (LIPITOR) 40 MG tablet TAKE 1 TABLET(40 MG) BY MOUTH DAILY 90 tablet 2   buPROPion (WELLBUTRIN XL) 150 MG 24 hr tablet      calcium carbonate (TUMS - DOSED IN MG ELEMENTAL CALCIUM) 500 MG chewable tablet Chew 1 tablet by mouth 2 (two) times daily as needed for indigestion or heartburn.     fluticasone (FLONASE) 50 MCG/ACT nasal spray Place 2 sprays into both nostrils daily. 16 g 6   ibuprofen (ADVIL,MOTRIN) 200 MG tablet Take 600 mg by mouth 2 (two) times daily as needed for mild pain.     levothyroxine (SYNTHROID) 125 MCG tablet TAKE 1 TABLET(125 MCG) BY MOUTH DAILY 30 tablet 3   losartan (COZAAR) 100 MG tablet TAKE 1 TABLET(100 MG) BY MOUTH DAILY 90 tablet 1   metroNIDAZOLE (METROGEL) 0.75 % gel Apply 1 application topically 2 (two) times daily. 45 g 3   oxyCODONE-acetaminophen (PERCOCET) 10-325 MG tablet Take 1 tablet by mouth every 8 (eight) hours as needed for pain. 90 tablet 0   pantoprazole (PROTONIX) 40 MG tablet Take 1 tablet (40 mg total) by mouth daily. 90 tablet 2   pregabalin (LYRICA) 150 MG capsule TAKE 1 CAPSULE(150 MG) BY MOUTH TWICE DAILY 60 capsule 3   Spacer/Aero-Holding Chambers (AEROCHAMBER MAX W/FLOW-VU) MISC Use as directed. 1 each 0   traMADol (ULTRAM) 50 MG tablet TAKE 1 TABLET(50 MG) BY MOUTH FOUR TIMES DAILY 120 tablet 3   No current facility-administered medications on file prior to visit.   Past Medical History:  Diagnosis Date   Acquired spondylolisthesis 05/12/2015   Adenocarcinoma of right lung, stage 1 ([place]) 07/26/2016    Anxiety    Arthropathy of lumbar facet joint 04/11/2019   Formatting of this note might be different from the original. Added automatically from request for surgery [id number]   Atherosclerosis of aorta ([place]) 07/08/2021   Benign essential hypertension 12/27/2016   BMI 30.0-30.9,adult 12/23/2020   COPD (chronic obstructive pulmonary disease) ([place])    pt denies   Current mild episode of major depressive disorder ([place]) 03/24/2020   Daytime sleepiness 08/03/2021   Depression    Fibromyalgia 03/24/2020   Gastroesophageal reflux disease without esophagitis 03/24/2020   Graves disease 12/27/2016   Hypertension    Osteoporosis 08/03/2021   Pericardial cyst 02/02/2021   Postoperative hypothyroidism 01/17/2015   Reactive airways dysfunction syndrome, unspecified asthma severity, uncomplicated ([place]) 6/78/9381   S/P lobectomy of lung 07/26/2015   Formatting of this note might be different from the original. Right upper   S/P lumbar spinal fusion 05/11/2018   Thyroid disease    Toe ulcer, left, limited to breakdown of skin ([place]) 12/23/2020   Past Surgical History:  Procedure Laterality Date   ANKLE SURGERY     bilateral   APPENDECTOMY     BREAST BIOPSY Left 02/25/2020   BREAST BIOPSY Right 08/17/2018   BREAST EXCISIONAL BIOPSY Right    unsure when but marked with scar marker   CHOLECYSTECTOMY  REPLACEMENT TOTAL KNEE Left    THYROIDECTOMY      Family History  Problem Relation Age of Onset   Cancer Mother    Cancer Father    Cancer Brother    Autism Grandson    Social History   Socioeconomic History   Marital status: Widowed    Spouse name: Not on file   Number of children: Not on file   Years of education: Not on file   Highest education level: Not on file  Occupational History   Not on file  Tobacco Use   Smoking status: Former    Packs/day: 1.50    Years: 35.00    Total pack years: 52.50    Types: Cigarettes    Quit date: 07/27/1999    Years since quitting: 23.2   Smokeless tobacco: Never   Vaping Use   Vaping Use: Never used  Substance and Sexual Activity   Alcohol use: No   Drug use: No   Sexual activity: Not Currently  Other Topics Concern   Not on file  Social History Narrative   Not on file   Social Determinants of Health   Financial Resource Strain: Low Risk  (06/02/2022)   Overall Financial Resource Strain (CARDIA)    Difficulty of Paying Living Expenses: Not very hard  Food Insecurity: No Food Insecurity (04/27/2022)   Hunger Vital Sign    Worried About Running Out of Food in the Last Year: Never true    Ran Out of Food in the Last Year: Never true  Transportation Needs: No Transportation Needs (06/02/2022)   PRAPARE - Hydrologist (Medical): No    Lack of Transportation (Non-Medical): No  Physical Activity: Sufficiently Active (04/27/2022)   Exercise Vital Sign    Days of Exercise per Week: 5 days    Minutes of Exercise per Session: 30 min  Stress: No Stress Concern Present (04/27/2022)   [place]    Feeling of Stress : Not at all  Social Connections: Socially Isolated (04/27/2022)   Social Connection and Isolation Panel [NHANES]    Frequency of Communication with Friends and Family: More than three times a week    Frequency of Social Gatherings with Friends and Family: Twice a week    Attends Religious Services: Never    Marine scientist or Organizations: Not on file    Attends Archivist Meetings: Never    Marital Status: Widowed    Review of Systems   Objective:  LMP  (LMP Unknown)      10/12/2022    1:45 PM 09/21/2022   11:00 AM 09/03/2022    9:45 AM  BP/Weight  Systolic BP 469 629 528  Diastolic BP 70 80 82  Wt. (Lbs) 186.2 179   BMI 31.96 kg/m2 31.71 kg/m2     Physical Exam  Diabetic Foot Exam - Simple   No data filed      Lab Results  Component Value Date   WBC 4.8 08/10/2022   HGB 13.9 08/10/2022   HCT 42.9  08/10/2022   PLT 263 08/10/2022   GLUCOSE 100 (H) 10/12/2022   CHOL 154 10/12/2022   TRIG 67 10/12/2022   HDL 82 10/12/2022   LDLCALC 59 10/12/2022   ALT 14 10/12/2022   AST 14 10/12/2022   NA 143 10/12/2022   K 4.6 10/12/2022   CL 105 10/12/2022   CREATININE 0.70 10/12/2022   BUN 22  10/12/2022   CO2 25 10/12/2022   TSH 2.920 08/10/2022   INR 0.95 05/03/2018      Assessment & Plan:   Problem List Items Addressed This Visit   None .  No orders of the defined types were placed in this encounter.   No orders of the defined types were placed in this encounter.    Follow-up: No follow-ups on file.  An After Visit Summary was printed and given to the patient.  [place] [phone]

## 2022-10-27 ENCOUNTER — Telehealth: Payer: Self-pay

## 2022-10-27 NOTE — Chronic Care Management (AMB) (Signed)
Chronic Care Management Pharmacy Assistant   Name: Victoria Pena  MRN: [id number] DOB: 13-Nov-1951  Reason for Encounter: Disease State/ Hypertension  Recent office visits:  10-26-2022 Effie Shy, RN. Covid vaccine and PNA vac given.   10-12-2022 Lillard Anes, MD. Glucose= 100, BUN/Creatinine Ratio= 31, Alkaline Phosphatase= 166. STOP meloxicam, omeprazole and promethazine. START ABILIFY 5 mg daily. Flu vaccine given.  09-21-2022 Lillard Anes, MD. Abnormal UA. STOP wellbutrin and HYDROcodone-acetaminophen. START Zithromax 250 mg Take 2 tablets on day 1, then 1 tablet daily on days 2 through 5 and PERCOCET 10-325 MG 1 tablet every 8 hours PRN.  09-03-2022 Rip Harbour, NP. STOP pravastatin. START PROMETHAZINE-DM 5 mls 4 times daily PRN and Paxlovid Take 3 tablets by mouth 2 (two) times daily for 5 days. (Take nirmatrelvir 150 mg two tablets twice daily for 5 days and ritonavir 100 mg one tablet twice daily for 5 days).  08-10-2022 Lillard Anes, MD. Chloride= 107, Calcium= 7.8, Alkaline Phosphatase= 148. Cholesterol= 204, LDL= 116. Referral placed to orthopedics. STOP Doxycycline and d-mannose.   06-28-2022 Rip Harbour, NP. Abnormal UA.   Recent consult visits:  10-07-2022 Meyran NP [facility] Neurosurgery). Visit for back pain.   09-06-2022 Heilingoetter, Tobe Sos, PA-C (Oncology). Follow up visit.  08-19-2022 Leonard Downing (Orthopedic surgery). Unable to view encounter.  Hospital visits:  None in previous 6 months  Medications: Outpatient Encounter Medications as of 10/27/2022  Medication Sig   ALPRAZolam (XANAX) 0.5 MG tablet TAKE 1 TABLET(0.5 MG) BY MOUTH TWICE DAILY AS NEEDED FOR ANXIETY   ARIPiprazole (ABILIFY) 5 MG tablet Take 1 tablet (5 mg total) by mouth daily.   atorvastatin (LIPITOR) 40 MG tablet TAKE 1 TABLET(40 MG) BY MOUTH DAILY   buPROPion (WELLBUTRIN XL) 150 MG 24 hr tablet    calcium carbonate (TUMS  - DOSED IN MG ELEMENTAL CALCIUM) 500 MG chewable tablet Chew 1 tablet by mouth 2 (two) times daily as needed for indigestion or heartburn.   fluticasone (FLONASE) 50 MCG/ACT nasal spray Place 2 sprays into both nostrils daily.   ibuprofen (ADVIL,MOTRIN) 200 MG tablet Take 600 mg by mouth 2 (two) times daily as needed for mild pain.   levothyroxine (SYNTHROID) 125 MCG tablet TAKE 1 TABLET(125 MCG) BY MOUTH DAILY   losartan (COZAAR) 100 MG tablet TAKE 1 TABLET(100 MG) BY MOUTH DAILY   metroNIDAZOLE (METROGEL) 0.75 % gel Apply 1 application topically 2 (two) times daily.   oxyCODONE-acetaminophen (PERCOCET) 10-325 MG tablet Take 1 tablet by mouth every 8 (eight) hours as needed for pain.   pantoprazole (PROTONIX) 40 MG tablet Take 1 tablet (40 mg total) by mouth daily.   pregabalin (LYRICA) 150 MG capsule TAKE 1 CAPSULE(150 MG) BY MOUTH TWICE DAILY   Spacer/Aero-Holding Chambers (AEROCHAMBER MAX W/FLOW-VU) MISC Use as directed.   traMADol (ULTRAM) 50 MG tablet TAKE 1 TABLET(50 MG) BY MOUTH FOUR TIMES DAILY   No facility-administered encounter medications on file as of 10/27/2022.   Recent Office Vitals: BP Readings from Last 3 Encounters:  10/12/22 120/70  09/21/22 (!) 140/80  09/03/22 133/82   Pulse Readings from Last 3 Encounters:  10/12/22 79  09/21/22 91  09/03/22 75    Wt Readings from Last 3 Encounters:  10/12/22 186 lb 3.2 oz (84.5 kg)  09/21/22 179 lb (81.2 kg)  08/10/22 182 lb (82.6 kg)     Kidney Function Lab Results  Component Value Date/Time   CREATININE 0.70 10/12/2022 02:33 PM  CREATININE 0.71 08/10/2022 10:56 AM   CREATININE 0.74 01/23/2021 02:53 PM   CREATININE 0.77 01/25/2020 01:45 PM   CREATININE 0.8 11/22/2017 02:51 PM   CREATININE 0.8 05/24/2017 09:34 AM   GFRNONAA >60 11/06/2021 07:21 PM   GFRNONAA >60 01/23/2021 02:53 PM   GFRAA 106 12/23/2020 11:23 AM   GFRAA >60 01/25/2020 01:45 PM       Latest Ref Rng & Units 10/12/2022    2:33 PM 08/10/2022    10:56 AM 01/25/2022    2:10 PM  BMP  Glucose 70 - 99 mg/dL 100  92  103   BUN 8 - 27 mg/dL 22  15  20    Creatinine 0.57 - 1.00 mg/dL 0.70  0.71  0.69   BUN/Creat Ratio 12 - 28 31  21  29    Sodium 134 - 144 mmol/L 143  142  138   Potassium 3.5 - 5.2 mmol/L 4.6  4.4  4.9   Chloride 96 - 106 mmol/L 105  107  102   CO2 20 - 29 mmol/L 25  22  24    Calcium 8.7 - 10.3 mg/dL 9.6  7.8  9.8     10-27-2022: 1st attempt left VM 10-28-2022: 2nd attempt left VM 09-28-2022: 3rd attempt left VM  Adherence Review: Is the patient currently on ACE/ARB medication? Yes Does the patient have >5 day gap between last estimated fill dates? CPP to review   Star Rating Drugs: Losartan 100 mg- Last filled 08-22-2022 90 DS  Pravastatin 40 mg- Last filled 08-11-2022 90 DS  [place] Clinical Pharmacist Assistant [phone]

## 2022-11-02 DIAGNOSIS — M5416 Radiculopathy, lumbar region: Secondary | ICD-10-CM | POA: Diagnosis not present

## 2022-11-02 DIAGNOSIS — Z6830 Body mass index (BMI) 30.0-30.9, adult: Secondary | ICD-10-CM | POA: Diagnosis not present

## 2022-11-03 ENCOUNTER — Ambulatory Visit (INDEPENDENT_AMBULATORY_CARE_PROVIDER_SITE_OTHER): Payer: Medicare PPO | Admitting: Legal Medicine

## 2022-11-03 ENCOUNTER — Other Ambulatory Visit: Payer: Self-pay

## 2022-11-03 ENCOUNTER — Other Ambulatory Visit: Payer: Self-pay | Admitting: Student

## 2022-11-03 ENCOUNTER — Telehealth: Payer: Self-pay

## 2022-11-03 VITALS — BP 130/90 | HR 79 | Temp 98.2°F | Resp 14 | Ht 64.0 in | Wt 181.0 lb

## 2022-11-03 DIAGNOSIS — M810 Age-related osteoporosis without current pathological fracture: Secondary | ICD-10-CM

## 2022-11-03 DIAGNOSIS — M8580 Other specified disorders of bone density and structure, unspecified site: Secondary | ICD-10-CM

## 2022-11-03 MED ORDER — DENOSUMAB 60 MG/ML ~~LOC~~ SOSY: 60.0000 mg | PREFILLED_SYRINGE | Freq: Once | SUBCUTANEOUS | Status: DC

## 2022-11-03 MED ORDER — FORTEO 600 MCG/2.4ML ~~LOC~~ SOPN: 20.0000 ug | PEN_INJECTOR | Freq: Every day | SUBCUTANEOUS | 6 refills | Status: DC

## 2022-11-03 MED ORDER — DENOSUMAB 60 MG/ML ~~LOC~~ SOSY: 60.0000 mg | PREFILLED_SYRINGE | Freq: Once | SUBCUTANEOUS | 0 refills | Status: AC

## 2022-11-03 MED ORDER — CALCIUM CARB-CHOLECALCIFEROL 500-5 MG-MCG PO TABS: 3.0000 | ORAL_TABLET | Freq: Every day | ORAL | 5 refills | Status: AC

## 2022-11-03 NOTE — Telephone Encounter (Signed)
PCP is putting patient on  forteo and prolia together (Sent me a direct msg today). Will coordinate with CCM team to cover

## 2022-11-03 NOTE — Chronic Care Management (AMB) (Signed)
Patient is needing assistance with Forteo and Prolia. Filling out [employer] patient assistance application for [employer]. Placing in the mail to patient 11/04/2022. Will apply patient through [place] portal to check benefits for Prolia. Will also fill out patient assistance application from [employer] just in case medical benefits does not cover for Prolia.  Pattricia Boss, [facility] Pharmacist Assistant [phone]

## 2022-11-03 NOTE — Telephone Encounter (Signed)
This encounter was created in error - please disregard.

## 2022-11-03 NOTE — Progress Notes (Signed)
Subjective:  Patient ID: Victoria Pena, female    DOB: 11-27-1951  Age: 71 y.o. MRN: [id number]  Chief Complaint  Patient presents with   Back Pain   Osteoporosis    HPI  Patient wants to discuss her DEXA scan and treatment for osteoporosis. She saw neurosurgerist and they recommended to start treatment, and then she can have a surgery on her back. Patient has severe osteoporosis -3.7. plans for CT-myelogram to assess overall damage.   Current Outpatient Medications on File Prior to Visit  Medication Sig Dispense Refill   ALPRAZolam (XANAX) 0.5 MG tablet TAKE 1 TABLET(0.5 MG) BY MOUTH TWICE DAILY AS NEEDED FOR ANXIETY 60 tablet 3   ARIPiprazole (ABILIFY) 5 MG tablet Take 1 tablet (5 mg total) by mouth daily. 30 tablet 3   atorvastatin (LIPITOR) 40 MG tablet TAKE 1 TABLET(40 MG) BY MOUTH DAILY 90 tablet 2   buPROPion (WELLBUTRIN XL) 150 MG 24 hr tablet      calcium carbonate (TUMS - DOSED IN MG ELEMENTAL CALCIUM) 500 MG chewable tablet Chew 1 tablet by mouth 2 (two) times daily as needed for indigestion or heartburn.     fluticasone (FLONASE) 50 MCG/ACT nasal spray Place 2 sprays into both nostrils daily. 16 g 6   ibuprofen (ADVIL,MOTRIN) 200 MG tablet Take 600 mg by mouth 2 (two) times daily as needed for mild pain.     levothyroxine (SYNTHROID) 125 MCG tablet TAKE 1 TABLET(125 MCG) BY MOUTH DAILY 30 tablet 3   losartan (COZAAR) 100 MG tablet TAKE 1 TABLET(100 MG) BY MOUTH DAILY 90 tablet 1   metroNIDAZOLE (METROGEL) 0.75 % gel Apply 1 application topically 2 (two) times daily. 45 g 3   oxyCODONE-acetaminophen (PERCOCET) 10-325 MG tablet Take 1 tablet by mouth every 8 (eight) hours as needed for pain. 90 tablet 0   pantoprazole (PROTONIX) 40 MG tablet Take 1 tablet (40 mg total) by mouth daily. 90 tablet 2   pregabalin (LYRICA) 150 MG capsule TAKE 1 CAPSULE(150 MG) BY MOUTH TWICE DAILY 60 capsule 3   Spacer/Aero-Holding Chambers (AEROCHAMBER MAX W/FLOW-VU) MISC Use as directed. 1  each 0   No current facility-administered medications on file prior to visit.   Past Medical History:  Diagnosis Date   Acquired spondylolisthesis 05/12/2015   Adenocarcinoma of right lung, stage 1 ([place]) 07/26/2016   Anxiety    Arthropathy of lumbar facet joint 04/11/2019   Formatting of this note might be different from the original. Added automatically from request for surgery [id number]   Atherosclerosis of aorta ([place]) 07/08/2021   Benign essential hypertension 12/27/2016   BMI 30.0-30.9,adult 12/23/2020   COPD (chronic obstructive pulmonary disease) ([place])    pt denies   Current mild episode of major depressive disorder ([place]) 03/24/2020   Daytime sleepiness 08/03/2021   Depression    Fibromyalgia 03/24/2020   Gastroesophageal reflux disease without esophagitis 03/24/2020   Graves disease 12/27/2016   Hypertension    Osteoporosis 08/03/2021   Pericardial cyst 02/02/2021   Postoperative hypothyroidism 01/17/2015   Reactive airways dysfunction syndrome, unspecified asthma severity, uncomplicated ([place]) 4/88/8916   S/P lobectomy of lung 07/26/2015   Formatting of this note might be different from the original. Right upper   S/P lumbar spinal fusion 05/11/2018   Thyroid disease    Toe ulcer, left, limited to breakdown of skin ([place]) 12/23/2020   Past Surgical History:  Procedure Laterality Date   ANKLE SURGERY     bilateral   APPENDECTOMY  BREAST BIOPSY Left 02/25/2020   BREAST BIOPSY Right 08/17/2018   BREAST EXCISIONAL BIOPSY Right    unsure when but marked with scar marker   CHOLECYSTECTOMY     REPLACEMENT TOTAL KNEE Left    THYROIDECTOMY      Family History  Problem Relation Age of Onset   Cancer Mother    Cancer Father    Cancer Brother    Autism Grandson    Social History   Socioeconomic History   Marital status: Widowed    Spouse name: Not on file   Number of children: Not on file   Years of education: Not on file   Highest education level: Not on file  Occupational History    Not on file  Tobacco Use   Smoking status: Former    Packs/day: 1.50    Years: 35.00    Total pack years: 52.50    Types: Cigarettes    Quit date: 07/27/1999    Years since quitting: 23.2   Smokeless tobacco: Never  Vaping Use   Vaping Use: Never used  Substance and Sexual Activity   Alcohol use: No   Drug use: No   Sexual activity: Not Currently  Other Topics Concern   Not on file  Social History Narrative   Not on file   Social Determinants of Health   Financial Resource Strain: Low Risk  (06/02/2022)   Overall Financial Resource Strain (CARDIA)    Difficulty of Paying Living Expenses: Not very hard  Food Insecurity: No Food Insecurity (04/27/2022)   Hunger Vital Sign    Worried About Running Out of Food in the Last Year: Never true    Ran Out of Food in the Last Year: Never true  Transportation Needs: No Transportation Needs (06/02/2022)   PRAPARE - Hydrologist (Medical): No    Lack of Transportation (Non-Medical): No  Physical Activity: Sufficiently Active (04/27/2022)   Exercise Vital Sign    Days of Exercise per Week: 5 days    Minutes of Exercise per Session: 30 min  Stress: No Stress Concern Present (04/27/2022)   [place]    Feeling of Stress : Not at all  Social Connections: Socially Isolated (04/27/2022)   Social Connection and Isolation Panel [NHANES]    Frequency of Communication with Friends and Family: More than three times a week    Frequency of Social Gatherings with Friends and Family: Twice a week    Attends Religious Services: Never    Marine scientist or Organizations: Not on file    Attends Archivist Meetings: Never    Marital Status: Widowed    Review of Systems  Constitutional:  Negative for chills, fatigue and fever.  HENT:  Negative for congestion, ear pain and sore throat.   Eyes:  Negative for visual disturbance.  Respiratory:   Negative for cough and shortness of breath.   Cardiovascular:  Negative for chest pain and palpitations.  Gastrointestinal:  Negative for abdominal pain, constipation, diarrhea, nausea and vomiting.  Endocrine: Negative for polydipsia, polyphagia and polyuria.  Genitourinary:  Negative for difficulty urinating and dysuria.  Musculoskeletal:  Positive for arthralgias and back pain. Negative for myalgias.  Skin:  Negative for rash.  Neurological:  Negative for headaches.  Psychiatric/Behavioral:  Negative for dysphoric mood. The patient is not nervous/anxious.      Objective:  BP (!) 130/90   Pulse 79   Temp 98.2  F (36.8 C)   Resp 14   Ht 5\' 4"  (1.626 m)   Wt 181 lb (82.1 kg)   LMP  (LMP Unknown)   SpO2 98%   BMI 31.07 kg/m      11/03/2022   11:06 AM 10/12/2022    1:45 PM 09/21/2022   11:00 AM  BP/Weight  Systolic BP 409 811 914  Diastolic BP 90 70 80  Wt. (Lbs) 181 186.2 179  BMI 31.07 kg/m2 31.96 kg/m2 31.71 kg/m2    Physical Exam Vitals reviewed.  Constitutional:      General: She is not in acute distress.    Appearance: Normal appearance.  HENT:     Head: Normocephalic.     Right Ear: Tympanic membrane normal.     Left Ear: Tympanic membrane normal.     Nose: Nose normal.     Mouth/Throat:     Mouth: Mucous membranes are moist.     Pharynx: Oropharynx is clear.  Eyes:     Extraocular Movements: Extraocular movements intact.     Conjunctiva/sclera: Conjunctivae normal.     Pupils: Pupils are equal, round, and reactive to light.  Cardiovascular:     Rate and Rhythm: Normal rate and regular rhythm.     Pulses: Normal pulses.     Heart sounds: Normal heart sounds. No murmur heard.    No gallop.  Pulmonary:     Effort: Pulmonary effort is normal. No respiratory distress.     Breath sounds: Normal breath sounds. No wheezing.  Abdominal:     General: Abdomen is flat. Bowel sounds are normal. There is no distension.     Palpations: Abdomen is soft.      Tenderness: There is no abdominal tenderness.  Musculoskeletal:        General: Normal range of motion.     Cervical back: Normal range of motion. Tenderness present.     Comments: Limited ROM spine, needs new fusion  Skin:    General: Skin is warm.     Capillary Refill: Capillary refill takes less than 2 seconds.  Neurological:     General: No focal deficit present.     Mental Status: She is alert and oriented to person, place, and time. Mental status is at baseline.         Lab Results  Component Value Date   WBC 4.8 08/10/2022   HGB 13.9 08/10/2022   HCT 42.9 08/10/2022   PLT 263 08/10/2022   GLUCOSE 100 (H) 10/12/2022   CHOL 154 10/12/2022   TRIG 67 10/12/2022   HDL 82 10/12/2022   LDLCALC 59 10/12/2022   ALT 14 10/12/2022   AST 14 10/12/2022   NA 143 10/12/2022   K 4.6 10/12/2022   CL 105 10/12/2022   CREATININE 0.70 10/12/2022   BUN 22 10/12/2022   CO2 25 10/12/2022   TSH 2.920 08/10/2022   INR 0.95 05/03/2018      Assessment & Plan:   Problem List Items Addressed This Visit       Musculoskeletal and Integument   Osteoporosis - Primary   Relevant Medications   Teriparatide, Recombinant, (FORTEO) 600 MCG/2.4ML SOPN   Calcium Carb-Cholecalciferol (CALCIUM 500 + D3) 500-5 MG-MCG TABS Patient has osteoporosis and need BMD improved to have needed neurosurgery.  Plan to use forteo and prolia, we will check for drug assistance  .  Meds ordered this encounter  Medications   Teriparatide, Recombinant, (FORTEO) 600 MCG/2.4ML SOPN    Sig: Inject 20 mcg  into the skin daily.    Dispense:  2.4 mL    Refill:  6   Calcium Carb-Cholecalciferol (CALCIUM 500 + D3) 500-5 MG-MCG TABS    Sig: Take 3 tablets by mouth daily.    Dispense:  100 tablet    Refill:  5    No orders of the defined types were placed in this encounter.    Follow-up: Return at next appointment.  An After Visit Summary was printed and given to the patient.  Reinaldo Meeker, MD [facility] [phone]

## 2022-11-04 ENCOUNTER — Telehealth: Payer: Self-pay

## 2022-11-04 NOTE — Telephone Encounter (Signed)
Patient called and stated that she called her insurance and she will not be able to afford Forteo.  She says its too expensive for her. She wanted you to know

## 2022-11-06 ENCOUNTER — Encounter: Payer: Self-pay | Admitting: Legal Medicine

## 2022-11-09 ENCOUNTER — Telehealth: Payer: Self-pay

## 2022-11-09 DIAGNOSIS — M48062 Spinal stenosis, lumbar region with neurogenic claudication: Secondary | ICD-10-CM | POA: Diagnosis not present

## 2022-11-09 DIAGNOSIS — M5416 Radiculopathy, lumbar region: Secondary | ICD-10-CM | POA: Diagnosis not present

## 2022-11-09 NOTE — Telephone Encounter (Signed)
Received call from [place] to schedule delivery of Prolia.  Patient's Prolia will be delivered Thursday, 11/18/22.  If not delivered on that day - we are to contact pharmacy within 24 hours.

## 2022-11-10 ENCOUNTER — Other Ambulatory Visit: Payer: Self-pay | Admitting: Student

## 2022-11-10 ENCOUNTER — Ambulatory Visit: Payer: Medicare PPO | Admitting: Legal Medicine

## 2022-11-10 DIAGNOSIS — M5410 Radiculopathy, site unspecified: Secondary | ICD-10-CM

## 2022-11-17 ENCOUNTER — Telehealth: Payer: Self-pay

## 2022-11-17 NOTE — Chronic Care Management (AMB) (Unsigned)
forteo

## 2022-11-19 ENCOUNTER — Other Ambulatory Visit: Payer: Self-pay | Admitting: Legal Medicine

## 2022-11-19 DIAGNOSIS — F064 Anxiety disorder due to known physiological condition: Secondary | ICD-10-CM

## 2022-11-22 ENCOUNTER — Ambulatory Visit
Admission: RE | Admit: 2022-11-22 | Discharge: 2022-11-22 | Disposition: A | Discharge status: Home / Self Care | Payer: Medicare PPO | Source: Ambulatory Visit | Attending: Student | Admitting: Student

## 2022-11-22 DIAGNOSIS — M5410 Radiculopathy, site unspecified: Secondary | ICD-10-CM

## 2022-11-22 DIAGNOSIS — Z981 Arthrodesis status: Secondary | ICD-10-CM | POA: Diagnosis not present

## 2022-11-22 DIAGNOSIS — M48061 Spinal stenosis, lumbar region without neurogenic claudication: Secondary | ICD-10-CM | POA: Diagnosis not present

## 2022-11-22 MED ORDER — DIAZEPAM 5 MG PO TABS
5.0000 mg | ORAL_TABLET | Freq: Once | ORAL | Status: AC
  Administered 2022-11-22: 5 mg via ORAL

## 2022-11-22 MED ORDER — ONDANSETRON HCL 4 MG/2ML IJ SOLN: 4.0000 mg | Freq: Once | INTRAMUSCULAR | Status: DC | PRN

## 2022-11-22 MED ORDER — MEPERIDINE HCL 50 MG/ML IJ SOLN: 50.0000 mg | Freq: Once | INTRAMUSCULAR | Status: DC | PRN

## 2022-11-22 MED ORDER — IOPAMIDOL (ISOVUE-M 200) INJECTION 41%
20.0000 mL | Freq: Once | INTRAMUSCULAR | Status: AC
  Administered 2022-11-22: 20 mL via INTRATHECAL

## 2022-11-22 NOTE — Discharge Instructions (Signed)

## 2022-11-24 ENCOUNTER — Encounter: Payer: Self-pay | Admitting: Legal Medicine

## 2022-11-24 ENCOUNTER — Ambulatory Visit (INDEPENDENT_AMBULATORY_CARE_PROVIDER_SITE_OTHER): Payer: Medicare PPO | Admitting: Legal Medicine

## 2022-11-24 VITALS — BP 100/62 | HR 83 | Temp 97.2°F | Resp 16 | Ht 65.0 in | Wt 175.0 lb

## 2022-11-24 DIAGNOSIS — R3915 Urgency of urination: Secondary | ICD-10-CM

## 2022-11-24 DIAGNOSIS — N3000 Acute cystitis without hematuria: Secondary | ICD-10-CM | POA: Diagnosis not present

## 2022-11-24 DIAGNOSIS — L739 Follicular disorder, unspecified: Secondary | ICD-10-CM | POA: Diagnosis not present

## 2022-11-24 DIAGNOSIS — M810 Age-related osteoporosis without current pathological fracture: Secondary | ICD-10-CM | POA: Diagnosis not present

## 2022-11-24 DIAGNOSIS — M4310 Spondylolisthesis, site unspecified: Secondary | ICD-10-CM

## 2022-11-24 LAB — POCT URINALYSIS DIP (CLINITEK)
Blood, UA: NEGATIVE
Glucose, UA: NEGATIVE mg/dL
Nitrite, UA: POSITIVE — AB
Spec Grav, UA: 1.025 (ref 1.010–1.025)
Urobilinogen, UA: 0.2 E.U./dL
pH, UA: 5 (ref 5.0–8.0)

## 2022-11-24 MED ORDER — DOXYCYCLINE HYCLATE 100 MG PO TABS: 100.0000 mg | ORAL_TABLET | Freq: Two times a day (BID) | ORAL | 0 refills | Status: DC

## 2022-11-24 MED ORDER — OXYCODONE-ACETAMINOPHEN 10-325 MG PO TABS: 1.0000 | ORAL_TABLET | Freq: Three times a day (TID) | ORAL | 0 refills | Status: DC | PRN

## 2022-11-24 MED ORDER — DENOSUMAB 60 MG/ML ~~LOC~~ SOSY
60.0000 mg | PREFILLED_SYRINGE | Freq: Once | SUBCUTANEOUS | Status: AC
  Administered 2022-11-24: 60 mg via SUBCUTANEOUS

## 2022-11-24 MED ORDER — MUPIROCIN 2 % EX OINT: 1.0000 | TOPICAL_OINTMENT | Freq: Two times a day (BID) | CUTANEOUS | 0 refills | Status: DC

## 2022-11-24 NOTE — Progress Notes (Signed)
Acute Office Visit  Subjective:    Patient ID: Victoria Pena, female    DOB: 31-Dec-1950, 71 y.o.   MRN: [id number]  Chief Complaint  Patient presents with   Urinary Tract Infection    HPI: Patient is in today for possible UTI, she just had myelogram 2 days. Has mild spinal stenosis. No fever or chills.  Urination frequency with 3+ leukocytes. Sent to culture. Has rash she showed red rash perirectally, multiple papules  Past Medical History:  Diagnosis Date   Acquired spondylolisthesis 05/12/2015   Adenocarcinoma of right lung, stage 1 ([place]) 07/26/2016   Anxiety    Arthropathy of lumbar facet joint 04/11/2019   Formatting of this note might be different from the original. Added automatically from request for surgery [id number]   Atherosclerosis of aorta ([place]) 07/08/2021   Benign essential hypertension 12/27/2016   BMI 30.0-30.9,adult 12/23/2020   COPD (chronic obstructive pulmonary disease) ([place])    pt denies   Current mild episode of major depressive disorder ([place]) 03/24/2020   Daytime sleepiness 08/03/2021   Depression    Fibromyalgia 03/24/2020   Gastroesophageal reflux disease without esophagitis 03/24/2020   Graves disease 12/27/2016   Hypertension    Osteoporosis 08/03/2021   Pericardial cyst 02/02/2021   Postoperative hypothyroidism 01/17/2015   Reactive airways dysfunction syndrome, unspecified asthma severity, uncomplicated ([place]) 1/79/1505   S/P lobectomy of lung 07/26/2015   Formatting of this note might be different from the original. Right upper   S/P lumbar spinal fusion 05/11/2018   Thyroid disease    Toe ulcer, left, limited to breakdown of skin ([place]) 12/23/2020    Past Surgical History:  Procedure Laterality Date   ANKLE SURGERY     bilateral   APPENDECTOMY     BREAST BIOPSY Left 02/25/2020   BREAST BIOPSY Right 08/17/2018   BREAST EXCISIONAL BIOPSY Right    unsure when but marked with scar marker   CHOLECYSTECTOMY     REPLACEMENT TOTAL KNEE Left    THYROIDECTOMY       Family History  Problem Relation Age of Onset   Cancer Mother    Cancer Father    Cancer Brother    Autism Grandson     Social History   Socioeconomic History   Marital status: Widowed    Spouse name: Not on file   Number of children: Not on file   Years of education: Not on file   Highest education level: Not on file  Occupational History   Not on file  Tobacco Use   Smoking status: Former    Packs/day: 1.50    Years: 35.00    Total pack years: 52.50    Types: Cigarettes    Quit date: 07/27/1999    Years since quitting: 23.3   Smokeless tobacco: Never  Vaping Use   Vaping Use: Never used  Substance and Sexual Activity   Alcohol use: No   Drug use: No   Sexual activity: Not Currently  Other Topics Concern   Not on file  Social History Narrative   Not on file   Social Determinants of Health   Financial Resource Strain: Low Risk  (06/02/2022)   Overall Financial Resource Strain (CARDIA)    Difficulty of Paying Living Expenses: Not very hard  Food Insecurity: No Food Insecurity (04/27/2022)   Hunger Vital Sign    Worried About Running Out of Food in the Last Year: Never true    Ran Out of Food in the Last Year: Never  true  Transportation Needs: No Transportation Needs (06/02/2022)   PRAPARE - Hydrologist (Medical): No    Lack of Transportation (Non-Medical): No  Physical Activity: Sufficiently Active (04/27/2022)   Exercise Vital Sign    Days of Exercise per Week: 5 days    Minutes of Exercise per Session: 30 min  Stress: No Stress Concern Present (04/27/2022)   [place]    Feeling of Stress : Not at all  Social Connections: Socially Isolated (04/27/2022)   Social Connection and Isolation Panel [NHANES]    Frequency of Communication with Friends and Family: More than three times a week    Frequency of Social Gatherings with Friends and Family: Twice a week    Attends  Religious Services: Never    Marine scientist or Organizations: Not on file    Attends Archivist Meetings: Never    Marital Status: Widowed  Intimate Partner Violence: Not At Risk (04/27/2022)   Humiliation, Afraid, Rape, and Kick questionnaire    Fear of Current or Ex-Partner: No    Emotionally Abused: No    Physically Abused: No    Sexually Abused: No    Outpatient Medications Prior to Visit  Medication Sig Dispense Refill   ALPRAZolam (XANAX) 0.5 MG tablet TAKE 1 TABLET(0.5 MG) BY MOUTH TWICE DAILY AS NEEDED FOR ANXIETY 60 tablet 3   ARIPiprazole (ABILIFY) 5 MG tablet Take 1 tablet (5 mg total) by mouth daily. 30 tablet 3   atorvastatin (LIPITOR) 40 MG tablet TAKE 1 TABLET(40 MG) BY MOUTH DAILY 90 tablet 2   buPROPion (WELLBUTRIN XL) 150 MG 24 hr tablet      Calcium Carb-Cholecalciferol (CALCIUM 500 + D3) 500-5 MG-MCG TABS Take 3 tablets by mouth daily. 100 tablet 5   calcium carbonate (TUMS - DOSED IN MG ELEMENTAL CALCIUM) 500 MG chewable tablet Chew 1 tablet by mouth 2 (two) times daily as needed for indigestion or heartburn.     fluticasone (FLONASE) 50 MCG/ACT nasal spray Place 2 sprays into both nostrils daily. 16 g 6   ibuprofen (ADVIL,MOTRIN) 200 MG tablet Take 600 mg by mouth 2 (two) times daily as needed for mild pain.     levothyroxine (SYNTHROID) 125 MCG tablet TAKE 1 TABLET(125 MCG) BY MOUTH DAILY 30 tablet 3   losartan (COZAAR) 100 MG tablet TAKE 1 TABLET(100 MG) BY MOUTH DAILY 90 tablet 1   metroNIDAZOLE (METROGEL) 0.75 % gel Apply 1 application topically 2 (two) times daily. 45 g 3   pantoprazole (PROTONIX) 40 MG tablet Take 1 tablet (40 mg total) by mouth daily. 90 tablet 2   pregabalin (LYRICA) 150 MG capsule TAKE 1 CAPSULE(150 MG) BY MOUTH TWICE DAILY 60 capsule 3   Spacer/Aero-Holding Chambers (AEROCHAMBER MAX W/FLOW-VU) MISC Use as directed. 1 each 0   Teriparatide, Recombinant, (FORTEO) 600 MCG/2.4ML SOPN Inject 20 mcg into the skin daily. 2.4 mL 6    oxyCODONE-acetaminophen (PERCOCET) 10-325 MG tablet Take 1 tablet by mouth every 8 (eight) hours as needed for pain. 90 tablet 0   No facility-administered medications prior to visit.    Allergies  Allergen Reactions   Keflex [Cephalexin] Shortness Of Breath    Hard time breathing   Robaxin [Methocarbamol] Nausea Only    Stomach pain . Could not eat   Bactrim [Sulfamethoxazole-Trimethoprim] Itching   Hydrocodone-Acetaminophen Anxiety    Face flushed, high blood pressure, headache, shaking.   Hydroxyzine Other (See Comments)  and Swelling    Numbness in lips   Prednisone & Diphenhydramine Other (See Comments)    Face flushed, heart racing   Amlodipine Other (See Comments)    Bp worsened. Shaky. Insomnia.   Ciprofloxacin    Dextromethorphan Polistirex Er    Nitrofurantoin    Pravastatin Other (See Comments)    joint pains   Diphenhydramine Hcl Palpitations   Moxifloxacin Rash   Prednisone Other (See Comments) and Palpitations    "flushing"     Review of Systems  Constitutional:  Negative for chills and fatigue.  HENT:  Negative for congestion, ear pain, sinus pain and sore throat.   Respiratory:  Negative for cough and shortness of breath.   Cardiovascular:  Negative for chest pain and leg swelling.  Gastrointestinal:  Negative for abdominal pain, constipation, diarrhea, nausea and vomiting.  Genitourinary:  Positive for dysuria and urgency. Negative for frequency.  Musculoskeletal:  Negative for arthralgias, back pain and myalgias.  Neurological:  Negative for dizziness and headaches.       Objective:    Physical Exam Vitals reviewed.  Constitutional:      Appearance: Normal appearance.  Cardiovascular:     Rate and Rhythm: Normal rate and regular rhythm.     Pulses: Normal pulses.     Heart sounds: Normal heart sounds. No murmur heard.    No gallop.  Pulmonary:     Effort: Pulmonary effort is normal. No respiratory distress.     Breath sounds: Normal breath  sounds. No wheezing.  Musculoskeletal:        General: Normal range of motion.  Skin:    Capillary Refill: Capillary refill takes less than 2 seconds.     Findings: Rash present.     Comments: Folliculitis perirectally  Neurological:     Mental Status: She is alert.     BP 100/62   Pulse 83   Temp (!) 97.2 F (36.2 C)   Resp 16   Ht _0  (1.651 m)   Wt 175 lb (79.4 kg)   LMP  (LMP Unknown)   SpO2 96%   BMI 29.12 kg/m  Wt Readings from Last 3 Encounters:  11/24/22 175 lb (79.4 kg)  11/03/22 181 lb (82.1 kg)  10/12/22 186 lb 3.2 oz (84.5 kg)    Health Maintenance Due  Topic Date Due   Hepatitis C Screening  Never done   DTaP/Tdap/Td (1 - Tdap) Never done   Zoster Vaccines- Shingrix (1 of 2) Never done   MAMMOGRAM  06/15/2022    There are no preventive care reminders to display for this patient.   Lab Results  Component Value Date   TSH 2.920 08/10/2022   Lab Results  Component Value Date   WBC 4.8 08/10/2022   HGB 13.9 08/10/2022   HCT 42.9 08/10/2022   MCV 90 08/10/2022   PLT 263 08/10/2022   Lab Results  Component Value Date   NA 143 10/12/2022   K 4.6 10/12/2022   CHLORIDE 107 11/22/2017   CO2 25 10/12/2022   GLUCOSE 100 (H) 10/12/2022   BUN 22 10/12/2022   CREATININE 0.70 10/12/2022   BILITOT 0.4 10/12/2022   ALKPHOS 166 (H) 10/12/2022   AST 14 10/12/2022   ALT 14 10/12/2022   PROT 6.7 10/12/2022   ALBUMIN 3.9 10/12/2022   CALCIUM 9.6 10/12/2022   ANIONGAP 9 11/06/2021   EGFR 92 10/12/2022   Lab Results  Component Value Date   CHOL 154 10/12/2022   Lab Results  Component Value Date   HDL 82 10/12/2022   Lab Results  Component Value Date   LDLCALC 59 10/12/2022   Lab Results  Component Value Date   TRIG 67 10/12/2022   Lab Results  Component Value Date   CHOLHDL 1.9 10/12/2022   No results found for: "HGBA1C"     Assessment & Plan:   Problem List Items Addressed This Visit       Musculoskeletal and Integument    Acquired spondylolisthesis   Relevant Medications   oxyCODONE-acetaminophen (PERCOCET) 10-325 MG tablet Chronic LBP, continue oxycodone    Osteoporosis Continue calcium and vitamin D, we discussed prolia but she is unable to afford,    Other Visit Diagnoses     Urgency of urination    -  Primary   Relevant Orders   POCT URINALYSIS DIP (CLINITEK) (Completed)   Urine Culture Culture negative    Folliculitis       Relevant Medications   mupirocin ointment (BACTROBAN) 2 % Use bactroban ointment to buttock lesions, may need to be cultured and IV antibiotics    Acute cystitis without hematuria       Relevant Medications   doxycycline (VIBRA-TABS) 100 MG tablet Cover possible UTI with doxycycline, she is allergic to sufa drugs      Meds ordered this encounter  Medications   doxycycline (VIBRA-TABS) 100 MG tablet    Sig: Take 1 tablet (100 mg total) by mouth 2 (two) times daily.    Dispense:  20 tablet    Refill:  0   mupirocin ointment (BACTROBAN) 2 %    Sig: Apply 1 Application topically 2 (two) times daily.    Dispense:  22 g    Refill:  0   oxyCODONE-acetaminophen (PERCOCET) 10-325 MG tablet    Sig: Take 1 tablet by mouth every 8 (eight) hours as needed for pain.    Dispense:  90 tablet    Refill:  0   denosumab (PROLIA) injection 60 mg    Order Specific Question:   Patient is enrolled in [facility] program for this medication and I have provided a copy of the Prolia Medication Guide and Patient Brochure.    Answer:   No    Order Specific Question:   I have reviewed with the patient the information in the Prolia Medication Guide and Patient Counseling Chart including the serious risks of Prolia and symptoms of each risk.    Answer:   Yes    Order Specific Question:   I have advised the patient to seek medical attention if they have signs or symptoms of any of the serious risks.    Answer:   Yes    Orders Placed This Encounter  Procedures   Urine Culture   POCT URINALYSIS  DIP (CLINITEK)     Follow-up: Return in about 4 months (around 03/26/2023).  An After Visit Summary was printed and given to the patient.  Reinaldo Meeker, MD [facility] [phone]

## 2022-11-25 ENCOUNTER — Ambulatory Visit: Payer: Medicare PPO

## 2022-11-29 ENCOUNTER — Other Ambulatory Visit: Payer: Self-pay | Admitting: Legal Medicine

## 2022-11-29 DIAGNOSIS — M797 Fibromyalgia: Secondary | ICD-10-CM

## 2022-11-29 LAB — URINE CULTURE

## 2022-11-29 NOTE — Progress Notes (Signed)
Culture klebsiella ozaenae, is she improving  lp

## 2022-11-30 ENCOUNTER — Telehealth: Payer: Medicare HMO

## 2022-11-30 ENCOUNTER — Other Ambulatory Visit: Payer: Self-pay | Admitting: Legal Medicine

## 2022-11-30 ENCOUNTER — Other Ambulatory Visit: Payer: Self-pay

## 2022-11-30 ENCOUNTER — Encounter: Payer: Self-pay | Admitting: Legal Medicine

## 2022-11-30 ENCOUNTER — Ambulatory Visit: Payer: Medicare HMO

## 2022-11-30 DIAGNOSIS — J432 Centrilobular emphysema: Secondary | ICD-10-CM

## 2022-11-30 DIAGNOSIS — M810 Age-related osteoporosis without current pathological fracture: Secondary | ICD-10-CM

## 2022-11-30 DIAGNOSIS — E782 Mixed hyperlipidemia: Secondary | ICD-10-CM

## 2022-11-30 DIAGNOSIS — F064 Anxiety disorder due to known physiological condition: Secondary | ICD-10-CM

## 2022-11-30 DIAGNOSIS — F324 Major depressive disorder, single episode, in partial remission: Secondary | ICD-10-CM

## 2022-11-30 DIAGNOSIS — M797 Fibromyalgia: Secondary | ICD-10-CM

## 2022-11-30 MED ORDER — ARIPIPRAZOLE 10 MG PO TABS: 10.0000 mg | ORAL_TABLET | Freq: Every day | ORAL | 2 refills | Status: DC

## 2022-11-30 NOTE — Progress Notes (Signed)
Chronic Care Management Pharmacy Note  11/30/2022 Name:  Victoria Pena MRN:  [id number] DOB:  1951-01-26  Summary: -Pleasant 71 year old female presents for f/u CCM visit. 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 During the week she babysits her 71 year old autistic grandson and 68 year old granddaughter. She has a Hx of Fibromyalgia, 2 knee replacements, 2 ankle Sx, spinal fusion, Lung cancer, and Grave's Disease. She is a widow and lives by herself, takes pride in being very independent. She left home at 70 years of age and was married 3x. Her husband passed 9 years ago (2023). She is currently taking an 11 month course for medical coding and on month 2 (as of April 2023). -Was on food stamps but is over the cut-off by $200. She wants to retire but has to work until her car is paid off in 2026 -From [place] -In December 2023, she lost her job because she keeps falling. -December 2023, just got a new puppy named, Rivers. [place]ees/Australian shepherd  Recommendations/Changes made from today's visit: -Patient Teriparatide PAP done last month. Hasn't heard back. Sent msg to CCM team to look into this   Subjective: Victoria Pena is an 71 y.o. year old female who is a primary patient of Henrene Pastor, Zeb Comfort, MD.  The CCM team was consulted for assistance with disease management and care coordination needs.    Engaged with patient by telephone for follow up visit in response to provider referral for pharmacy case management and/or care coordination services.   Consent to Services:  The patient was given the following information about Chronic Care Management services today, agreed to services, and gave verbal consent: 1. CCM service includes personalized support from designated clinical staff supervised by the primary care provider, including individualized plan of care and coordination with other care providers 2.  24/7 contact phone numbers for assistance for urgent and routine care needs. 3. Service will only be billed when office clinical staff spend 20 minutes or more in a month to coordinate care. 4. Only one practitioner may furnish and bill the service in a calendar month. 5.The patient may stop CCM services at any time (effective at the end of the month) by phone call to the office staff. 6. The patient will be responsible for cost sharing (co-pay) of up to 20% of the service fee (after annual deductible is met). Patient agreed to services and consent obtained.  Patient Care Team: Lillard Anes, MD as PCP - General (Family Medicine) Lane Hacker, [facility] (Pharmacist)  Recent office visits:  10-26-2022 Effie Shy, RN. Covid vaccine and PNA vac given.    10-12-2022 Lillard Anes, MD. Glucose= 100, BUN/Creatinine Ratio= 31, Alkaline Phosphatase= 166. STOP meloxicam, omeprazole and promethazine. START ABILIFY 5 mg daily. Flu vaccine given.   09-21-2022 Lillard Anes, MD. Abnormal UA. STOP wellbutrin and HYDROcodone-acetaminophen. START Zithromax 250 mg Take 2 tablets on day 1, then 1 tablet daily on days 2 through 5 and PERCOCET 10-325 MG 1 tablet every 8 hours PRN.   09-03-2022 Rip Harbour, NP. STOP pravastatin. START PROMETHAZINE-DM 5 mls 4 times daily PRN and Paxlovid Take 3 tablets by mouth 2 (two) times daily for 5 days. (Take nirmatrelvir 150 mg two tablets twice daily for 5 days and ritonavir 100 mg one tablet twice daily for 5 days).   08-10-2022 Lillard Anes, MD.  Chloride= 107, Calcium= 7.8, Alkaline Phosphatase= 148. Cholesterol= 204, LDL= 116. Referral placed to orthopedics. STOP Doxycycline and d-mannose.    06-28-2022 Rip Harbour, NP. Abnormal UA.    Recent consult visits:  10-07-2022 Meyran NP [facility] Neurosurgery). Visit for back pain.    09-06-2022 Heilingoetter, Tobe Sos, PA-C (Oncology). Follow up visit.    08-19-2022 Leonard Downing (Orthopedic surgery). Unable to view encounter.   Hospital visits:  None in previous 6 months   Objective:  Lab Results  Component Value Date   CREATININE 0.70 10/12/2022   BUN 22 10/12/2022   EGFR 92 10/12/2022   GFRNONAA >60 11/06/2021   GFRAA 106 12/23/2020   NA 143 10/12/2022   K 4.6 10/12/2022   CALCIUM 9.6 10/12/2022   CO2 25 10/12/2022   GLUCOSE 100 (H) 10/12/2022    No results found for: "HGBA1C", "FRUCTOSAMINE", "GFR", "MICROALBUR"  Last diabetic Eye exam: No results found for: "HMDIABEYEEXA"  Last diabetic Foot exam: No results found for: "HMDIABFOOTEX"   Lab Results  Component Value Date   CHOL 154 10/12/2022   HDL 82 10/12/2022   LDLCALC 59 10/12/2022   TRIG 67 10/12/2022   CHOLHDL 1.9 10/12/2022       Latest Ref Rng & Units 10/12/2022    2:33 PM 08/10/2022   10:56 AM 01/25/2022    2:10 PM  Hepatic Function  Total Protein 6.0 - 8.5 g/dL 6.7  6.4  6.9   Albumin 3.8 - 4.8 g/dL 3.9  4.1  4.4   AST 0 - 40 IU/L _0 ALT 0 - 32 IU/L _1 Alk Phosphatase 44 - 121 IU/L 166  148  175   Total Bilirubin 0.0 - 1.2 mg/dL 0.4  0.5  0.4     Lab Results  Component Value Date/Time   TSH 2.920 08/10/2022 10:56 AM   TSH 0.340 (L) 02/19/2022 12:01 PM   FREET4 1.90 (H) 02/19/2022 12:01 PM   FREET4 1.41 03/24/2020 11:24 AM       Latest Ref Rng & Units 08/10/2022   10:56 AM 01/25/2022    2:10 PM 11/06/2021    7:21 PM  CBC  WBC 3.4 - 10.8 x10E3/uL 4.8  5.0  6.5   Hemoglobin 11.1 - 15.9 g/dL 13.9  14.4  14.9   Hematocrit 34.0 - 46.6 % 42.9  41.8  46.9   Platelets 150 - 450 x10E3/uL 263  283  264     Lab Results  Component Value Date/Time   VD25OH 22.3 (L) 01/25/2022 02:10 PM    Clinical ASCVD: Yes  The 10-year ASCVD risk score (Arnett DK, et al., 2019) is: 7.7%   Values used to calculate the score:     Age: 87 years     Sex: Female     Is Non-Hispanic African American: No     Diabetic: No     Tobacco smoker: No      Systolic Blood Pressure: 025 mmHg     Is BP treated: Yes     HDL Cholesterol: 82 mg/dL     Total Cholesterol: 154 mg/dL       10/12/2022    1:53 PM 10/12/2022    1:48 PM 09/21/2022   11:25 AM  Depression screen PHQ 2/9  Decreased Interest _2 Down, Depressed, Hopeless _3 PHQ - 2 Score _4 Altered sleeping 0 0 3  Tired, decreased energy  _0 Change in appetite 0 0 3  Feeling bad or failure about yourself  0 0 2  Trouble concentrating _1 Moving slowly or fidgety/restless _2 Suicidal thoughts 0 0 0  PHQ-9 Score _3 Difficult doing work/chores Not difficult at all Not difficult at all Very difficult     Other: (CHADS2VASc if Afib, MMRC or CAT for COPD, ACT, DEXA)  Social History   Tobacco Use  Smoking Status Former   Packs/day: 1.50   Years: 35.00   Total pack years: 52.50   Types: Cigarettes   Quit date: 07/27/1999   Years since quitting: 23.3  Smokeless Tobacco Never   BP Readings from Last 3 Encounters:  11/24/22 100/62  11/22/22 107/69  11/03/22 (!) 130/90   Pulse Readings from Last 3 Encounters:  11/24/22 83  11/22/22 76  11/03/22 79   Wt Readings from Last 3 Encounters:  11/24/22 175 lb (79.4 kg)  11/03/22 181 lb (82.1 kg)  10/12/22 186 lb 3.2 oz (84.5 kg)   BMI Readings from Last 3 Encounters:  11/24/22 29.12 kg/m  11/03/22 31.07 kg/m  10/12/22 31.96 kg/m    Assessment/Interventions: Review of patient past medical history, allergies, medications, health status, including review of consultants reports, laboratory and other test data, was performed as part of comprehensive evaluation and provision of chronic care management services.   SDOH:  (Social Determinants of Health) assessments and interventions performed: Yes SDOH Interventions    Flowsheet Row Chronic Care Management from 11/30/2022 in [place] Office Visit from 10/12/2022 in [place] Office Visit from 08/10/2022 in [place] Management from 06/02/2022 in [place] from 04/27/2022 in [place] Management from 03/30/2022 in [place] Interventions        Food Insecurity Interventions -- -- -- -- Intervention Not Indicated --  Housing Interventions -- -- -- -- Intervention Not Indicated --  Transportation Interventions Intervention Not Indicated -- -- Intervention Not Indicated Intervention Not Indicated Intervention Not Indicated  Depression Interventions/Treatment  -- Medication Currently on Treatment -- -- --  Financial Strain Interventions Other (Comment)  [PAP/Social Worker] -- -- Intervention Not Indicated Intervention Not Indicated Intervention Not Indicated  Physical Activity Interventions -- -- -- -- Intervention Not Indicated --  Stress Interventions -- -- -- -- Intervention Not Indicated --  Social Connections Interventions -- -- -- -- Intervention Not Indicated --      SDOH Screenings   Food Insecurity: No Food Insecurity (04/27/2022)  Housing: Low Risk  (04/27/2022)  Transportation Needs: No Transportation Needs (11/30/2022)  Alcohol Screen: Low Risk  (04/27/2022)  Depression (PHQ2-9): High Risk (10/12/2022)  Financial Resource Strain: High Risk (11/30/2022)  Physical Activity: Sufficiently Active (04/27/2022)  Social Connections: Socially Isolated (04/27/2022)  Stress: No Stress Concern Present (04/27/2022)  Tobacco Use: Medium Risk (11/24/2022)    CCM Care Plan  Allergies  Allergen Reactions   Keflex [Cephalexin] Shortness Of Breath    Hard time breathing   Robaxin [Methocarbamol] Nausea Only    Stomach pain . Could not eat   Bactrim [Sulfamethoxazole-Trimethoprim] Itching   Hydrocodone-Acetaminophen Anxiety    Face flushed, high blood pressure, headache, shaking.   Hydroxyzine Other (See Comments) and Swelling    Numbness in lips   Prednisone & Diphenhydramine Other (See Comments)    Face flushed, heart racing   Amlodipine Other  (See Comments)    Bp  worsened. Shaky. Insomnia.   Ciprofloxacin    Dextromethorphan Polistirex Er    Nitrofurantoin    Pravastatin Other (See Comments)    joint pains   Diphenhydramine Hcl Palpitations   Moxifloxacin Rash   Prednisone Other (See Comments) and Palpitations    "flushing"     Medications Reviewed Today     Reviewed by Lane Hacker, [facility] (Pharmacist) on 11/30/22 at 1523  Med List Status: <None>   Medication Order Taking? Sig Documenting Provider Last Dose Status Informant  ALPRAZolam (XANAX) 0.5 MG tablet [id number]  TAKE 1 TABLET(0.5 MG) BY MOUTH TWICE DAILY AS NEEDED FOR ANXIETY Lillard Anes, MD  Active   ARIPiprazole (ABILIFY) 5 MG tablet [id number] Yes Take 1 tablet (5 mg total) by mouth daily. Lillard Anes, MD Taking Active   atorvastatin (LIPITOR) 40 MG tablet [id number]  TAKE 1 TABLET(40 MG) BY MOUTH DAILY Lillard Anes, MD  Active   buPROPion (WELLBUTRIN XL) 150 MG 24 hr tablet [id number] Yes  [provider] Taking Active   Calcium Carb-Cholecalciferol (CALCIUM 500 + D3) 500-5 MG-MCG TABS [id number]  Take 3 tablets by mouth daily. Lillard Anes, MD  Active   calcium carbonate (TUMS - DOSED IN MG ELEMENTAL CALCIUM) 500 MG chewable tablet [id number]  Chew 1 tablet by mouth 2 (two) times daily as needed for indigestion or heartburn. [provider]  Active   doxycycline (VIBRA-TABS) 100 MG tablet [id number]  Take 1 tablet (100 mg total) by mouth 2 (two) times daily. Lillard Anes, MD  Active   fluticasone [facility]) 50 MCG/ACT nasal spray [id number]  Place 2 sprays into both nostrils daily. Rip Harbour, NP  Active   ibuprofen (ADVIL,MOTRIN) 200 MG tablet [id number]  Take 600 mg by mouth 2 (two) times daily as needed for mild pain. [provider]  Active Self  levothyroxine (SYNTHROID) 125 MCG tablet [id number]  TAKE 1 TABLET(125 MCG) BY MOUTH DAILY Lillard Anes, MD  Active   losartan  (COZAAR) 100 MG tablet [id number]  TAKE 1 TABLET(100 MG) BY MOUTH DAILY Lillard Anes, MD  Active   metroNIDAZOLE (METROGEL) 0.75 % gel [id number]  Apply 1 application topically 2 (two) times daily. Lillard Anes, MD  Active   mupirocin ointment (BACTROBAN) 2 % [id number]  Apply 1 Application topically 2 (two) times daily. Lillard Anes, MD  Active   oxyCODONE-acetaminophen [facility]) 10-325 MG tablet [id number]  Take 1 tablet by mouth every 8 (eight) hours as needed for pain. Lillard Anes, MD  Active   pantoprazole (PROTONIX) 40 MG tablet [id number]  Take 1 tablet (40 mg total) by mouth daily. Lillard Anes, MD  Active   pregabalin (LYRICA) 150 MG capsule [id number]  TAKE 1 CAPSULE(150 MG) BY MOUTH TWICE DAILY Lillard Anes, MD  Active   Spacer/Aero-Holding Chambers (AEROCHAMBER MAX W/FLOW-VU) [place] [id number]  Use as directed. Lillard Anes, MD  Active   Teriparatide, Recombinant, (FORTEO) 600 MCG/2.4ML [facility] [id number]  Inject 20 mcg into the skin daily. Lillard Anes, MD  Active             Patient Active Problem List   Diagnosis Date Noted   Other specified disorders of eye and adnexa 02/19/2022   History of Graves' disease 01/25/2022   Ankle pain 01/25/2022   Osteoporosis 08/03/2021   Daytime sleepiness 08/03/2021   Atherosclerosis of aorta ([place]) 07/08/2021   COPD (chronic obstructive pulmonary disease) (HCC)    Pericardial cyst 02/02/2021  BMI 30.0-30.9,adult 12/23/2020   Current mild episode of major depressive disorder ([place]) 03/24/2020   Gastroesophageal reflux disease without esophagitis 03/24/2020   Fibromyalgia 03/24/2020   Arthropathy of lumbar facet joint 04/11/2019   S/P lumbar spinal fusion 05/11/2018   Anxiety 12/27/2016   Benign essential hypertension 12/27/2016   Depression 12/27/2016   Adenocarcinoma of right lung, stage 1 ([place]) 07/26/2016   Reactive airways dysfunction syndrome, unspecified asthma  severity, uncomplicated ([place]) 87/86/7672   S/P lobectomy of lung 07/26/2015   Acquired spondylolisthesis 05/12/2015   Postoperative hypothyroidism 01/17/2015    Immunization History  Administered Date(s) Administered   COVID-19, mRNA, vaccine(Comirnaty)12 years and older 10/26/2022   Fluad Quad(high Dose 65+) 09/23/2020, 10/27/2021, 10/12/2022   Influenza-Unspecified 08/25/2018   Moderna SARS-COV2 Booster Vaccination 02/10/2021   Moderna Sars-Covid-2 Vaccination 01/14/2020, 03/03/2020   PNEUMOCOCCAL CONJUGATE-20 10/26/2022   Pneumococcal Polysaccharide-23 09/23/2020    Conditions to be addressed/monitored:  Hypertension, GERD, and Anxiety  Care Plan : [place]  Updates made by Lane Hacker, [place] since 11/30/2022 12:00 AM     Problem: Disease State Management   Priority: High  Onset Date: 03/30/2022     Long-Range Goal: Chronic Pain, HTN, GERD   Start Date: 03/30/2022  Expected End Date: 03/30/2023  Recent Progress: On track  Priority: High  Note:   Current Barriers:  Does not contact provider office for questions/concerns  Pharmacist Clinical Goal(s):  Patient will verbalize ability to afford treatment regimen through collaboration with PharmD and provider.   Interventions: 1:1 collaboration with Lillard Anes, MD regarding development and update of comprehensive plan of care as evidenced by provider attestation and co-signature Inter-disciplinary care team collaboration (see longitudinal plan of care) Comprehensive medication review performed; medication list updated in electronic medical record  Hypertension (BP goal <130/80) BP Readings from Last 3 Encounters:  11/24/22 100/62  11/22/22 107/69  11/03/22 (!) 130/90  -Controlled -Current treatment: Losartan 115m Appropriate, Effective, Safe, Accessible -Medications previously tried: N/A  -Current home readings: Doesn't test -Current dietary habits: "Tries to eat healthy" -Current  exercise habits: 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 -Denies hypotensive/hypertensive symptoms -Educated on BP goals and benefits of medications for prevention of heart attack, stroke and kidney damage; -Counseled to monitor BP at home PRN, document, and provide log at future appointments -Recommended to continue current medication June 2023: Patient started walking 20-30 minutes every evening!  Depression/Anxiety (Goal: Improve mood) -Not ideally controlled -Current treatment: Bupropion 1560mXL Appropriate, Query effective, Safe, Accessible Aripiprazole 1053mD Appropriate, Query effective, ,  Alprazolam 0.5mg35mN Appropriate, Query effective, Safe, Accessible -Medications previously tried/failed: Escitalopram -PHQ9:     10/12/2022    1:53 PM 10/12/2022    1:48 PM 09/21/2022   11:25 AM  Depression screen PHQ 2/9  Decreased Interest _0 Down, Depressed, Hopeless _1 PHQ - 2 Score _2 Altered sleeping 0 0 3  Tired, decreased energy _3 Change in appetite 0 0 3  Feeling bad or failure about yourself  0 0 2  Trouble concentrating _4 Moving slowly or fidgety/restless _5 Suicidal thoughts 0 0 0  PHQ-9 Score _6 Difficult doing work/chores Not difficult at all Not difficult at all Very difficult  -GAD7:     03/24/2020    2:03  PM  GAD 7 : Generalized Anxiety Score  Nervous, Anxious, on Edge 2  Control/stop worrying 1  Worry too much - different things 1  Trouble relaxing 2  Restless 2  Easily annoyed or irritable 2  Afraid - awful might happen 2  Total GAD 7 Score 12  Anxiety Difficulty Very difficult  -Educated on Benefits of medication for symptom control April 2023: Wanted to do PHQ9/GAD7 today but patient was babysitting 3 grandkids and kept being interrupted. Will try at future visit to do scores. However, patient  stated multiple times, "I'm just tired all the time" and states she is not content with therapy. Was on Bupropion 369m in the past but was decreased due to addition of Hydroxyzine. She would like to try Bupropion 3072magain, will ask PCP June 2023: Content on increase dose. Had shakiness at first, PCP recommended she bump down med, but patient continued taking 30092mnd ADR's dissapated  December 2023: Patient is still very depressed. She lost her job (Because she kept falling) and now can't afford medications/housing. Patient wants to increase Aripiprazole dose from 5mg19m0mg. Sent Dr. PerrHenrene Pastorect msg, he increased dose -Patient agreed to Social Worker consult, will ask Dr. Cox Tobie Poetut this too  Thyroid (Grave's Disease) (Goal TSH: 0.4-5.0) Lab Results  Component Value Date   TSH 2.920 08/10/2022  -Uncontrolled -Current treatment: Levothyroxine 125mc9mpropriate, Effective, Safe, Accessible -Counseled to take medication on an empty stomach -Recommended to continue current medication   GERD (Goal: minimize symptoms of reflux ) -Controlled -Current treatment  Omeprazole 40mg 45mopriate, Effective, Safe, Accessible -Medications previously tried: none reported  April 2023: Will go over more in depth at future visit. Main priority was mood  Chronic Pain -Not ideally controlled -Current treatment: Meloxicam 15mg P63mppropriate, Query effective, Safe, Accessible Pregabalin 150mg Ap84mriate, Query effective, Safe, Accessible Oxycodone 5/325mg App49miate, Effective, Safe, Accessible -Medications previously tried: IBU (No longer taking per patient) -Pain Scale There were no vitals filed for this visit.  Aggravating Factors: Movement  Pain Type: Chronic  April 2023: Unable to get pain scale today. Patient babysitting multiple grandkids and we kept getting interrupted. However, she did state she was not satisfied with pain management. Main priority was mood, will look into pain at  subsequent visits June 2023: Unable to conduct pain scale today, patient is unsatisfied with therapy but is able to do her daily activities of life with medications   Osteoporosis / Osteopenia (Goal prevent fracture) -Uncontrolled -Last DEXA Scan: 2019  T-Score Femur Total Right:  04/25/18: -3.0 T-Score lumbar spine:  04/25/18: -3.0  10-year probability of major osteoporotic fracture: 19%  10-year probability of hip fracture: 6.4% -Patient is a candidate for pharmacologic treatment due to T-Score < -2.5 in femoral neck -Current treatment  Calcium/Vit D Appropriate, Effective, Safe, Accessible Prolia Appropriate, Effective, Safe, Query accessible December 2023, cost $99 and patient paid it Teriparatide Appropriate, Effective, Safe, Query accessible December 2023: Patient never started but PAP was done in November -Medications previously tried:   -Recommend [id number] units of vitamin D daily. Recommend 1200 mg of calcium daily from dietary and supplemental sources. December 2023: Will have CCM team look into Teriparatide PAP    Patient Goals/Self-Care Activities Patient will:  - take medications as prescribed as evidenced by patient report and record review  Follow Up Plan: The patient has been provided with contact information for the care management team and has been advised to call with any health related questions or concerns.   CPP F/U  March 2024  [place] Constable, Sherian Rein.D. - [phone]        Medication Assistance:  -Teriparatide   Compliance/Adherence/Medication fill history: Adherence Review: Is the patient currently on ACE/ARB medication? Yes Does the patient have >5 day gap between last estimated fill dates? CPP to review     Star Rating Drugs: Losartan 100 mg- Last filled 08-22-2022 90 DS  Pravastatin 40 mg- Last filled 08-11-2022 90 DS  Patient's preferred pharmacy is:  [id number] [address], [place] [address] Phone: [phone] Fax: [phone]  [facility] [place], [place] [address] Phone: [phone] Fax: [phone]  [facility] [id number] - [place], [address] AT [place] [address] [place] [address] Phone: [phone] Fax: [phone]    Care Plan and Follow Up Patient Decision:  Patient agrees to Care Plan and Follow-up.  Plan: The patient has been provided with contact information for the care management team and has been advised to call with any health related questions or concerns.   CPP f/u March 2024  [place] Constable, Sherian Rein.D. - [phone]

## 2022-11-30 NOTE — Patient Instructions (Signed)
Visit Information   Goals Addressed   None    Patient Care Plan: CCM Pharmacy Care Plan     Problem Identified: Disease State Management   Priority: High  Onset Date: 03/30/2022     Long-Range Goal: Chronic Pain, HTN, GERD   Start Date: 03/30/2022  Expected End Date: 03/30/2023  Recent Progress: On track  Priority: High  Note:   Current Barriers:  Does not contact provider office for questions/concerns  Pharmacist Clinical Goal(s):  Patient will verbalize ability to afford treatment regimen through collaboration with PharmD and provider.   Interventions: 1:1 collaboration with Lillard Anes, MD regarding development and update of comprehensive plan of care as evidenced by provider attestation and co-signature Inter-disciplinary care team collaboration (see longitudinal plan of care) Comprehensive medication review performed; medication list updated in electronic medical record  Hypertension (BP goal <130/80) BP Readings from Last 3 Encounters:  11/24/22 100/62  11/22/22 107/69  11/03/22 (!) 130/90  -Controlled -Current treatment: Losartan 100mg  Appropriate, Effective, Safe, Accessible -Medications previously tried: N/A  -Current home readings: Doesn't test -Current dietary habits: "Tries to eat healthy" -Current exercise habits: 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 -Denies hypotensive/hypertensive symptoms -Educated on BP goals and benefits of medications for prevention of heart attack, stroke and kidney damage; -Counseled to monitor BP at home PRN, document, and provide log at future appointments -Recommended to continue current medication June 2023: Patient started walking 20-30 minutes every evening!  Depression/Anxiety (Goal: Improve mood) -Not ideally controlled -Current treatment: Bupropion 150mg  XL Appropriate, Query  effective, Safe, Accessible Aripiprazole 10mg  QD Appropriate, Query effective, ,  Alprazolam 0.5mg  PRN Appropriate, Query effective, Safe, Accessible -Medications previously tried/failed: Escitalopram -PHQ9:     10/12/2022    1:53 PM 10/12/2022    1:48 PM 09/21/2022   11:25 AM  Depression screen PHQ 2/9  Decreased Interest 3 3 3   Down, Depressed, Hopeless 2 2 3   PHQ - 2 Score 5 5 6   Altered sleeping 0 0 3  Tired, decreased energy 2 2 3   Change in appetite 0 0 3  Feeling bad or failure about yourself  0 0 2  Trouble concentrating 3 3 3   Moving slowly or fidgety/restless 1 1 3   Suicidal thoughts 0 0 0  PHQ-9 Score 11 11 23   Difficult doing work/chores Not difficult at all Not difficult at all Very difficult  -GAD7:     03/24/2020    2:03 PM  GAD 7 : Generalized Anxiety Score  Nervous, Anxious, on Edge 2  Control/stop worrying 1  Worry too much - different things 1  Trouble relaxing 2  Restless 2  Easily annoyed or irritable 2  Afraid - awful might happen 2  Total GAD 7 Score 12  Anxiety Difficulty Very difficult  -Educated on Benefits of medication for symptom control April 2023: Wanted to do PHQ9/GAD7 today but patient was babysitting 3 grandkids and kept being interrupted. Will try at future visit to do scores. However, patient stated multiple times, "I'm just tired all the time" and states she is not content with therapy. Was on Bupropion 300mg  in the past but was decreased due to addition of Hydroxyzine. She would like to try Bupropion 300mg  again, will ask PCP June 2023: Content on increase dose. Had shakiness at first, PCP recommended she bump down med, but patient continued taking 300mg  and ADR's dissapated  December  2023: Patient is still very depressed. She lost her job (Because she kept falling) and now can't afford medications/housing. Patient wants to increase Aripiprazole dose from 5mg ->10mg . Sent Dr. Henrene Pastor direct msg, he increased dose -Patient agreed to Social  Worker consult, will ask Dr. Tobie Poet about this too  Thyroid (Grave's Disease) (Goal TSH: 0.4-5.0) Lab Results  Component Value Date   TSH 2.920 08/10/2022  -Uncontrolled -Current treatment: Levothyroxine 173mcg Appropriate, Effective, Safe, Accessible -Counseled to take medication on an empty stomach -Recommended to continue current medication   GERD (Goal: minimize symptoms of reflux ) -Controlled -Current treatment  Omeprazole 40mg  Appropriate, Effective, Safe, Accessible -Medications previously tried: none reported  April 2023: Will go over more in depth at future visit. Main priority was mood  Chronic Pain -Not ideally controlled -Current treatment: Meloxicam 15mg  PRN Appropriate, Query effective, Safe, Accessible Pregabalin 150mg  Appropriate, Query effective, Safe, Accessible Oxycodone 5/325mg  Appropriate, Effective, Safe, Accessible -Medications previously tried: IBU (No longer taking per patient) -Pain Scale There were no vitals filed for this visit.  Aggravating Factors: Movement  Pain Type: Chronic  April 2023: Unable to get pain scale today. Patient babysitting multiple grandkids and we kept getting interrupted. However, she did state she was not satisfied with pain management. Main priority was mood, will look into pain at subsequent visits June 2023: Unable to conduct pain scale today, patient is unsatisfied with therapy but is able to do her daily activities of life with medications   Osteoporosis / Osteopenia (Goal prevent fracture) -Uncontrolled -Last DEXA Scan: 2019  T-Score Femur Total Right:  04/25/18: -3.0 T-Score lumbar spine:  04/25/18: -3.0  10-year probability of major osteoporotic fracture: 19%  10-year probability of hip fracture: 6.4% -Patient is a candidate for pharmacologic treatment due to T-Score < -2.5 in femoral neck -Current treatment  Calcium/Vit D Appropriate, Effective, Safe, Accessible Prolia Appropriate, Effective, Safe, Query  accessible December 2023, cost $99 and patient paid it Teriparatide Appropriate, Effective, Safe, Query accessible December 2023: Patient never started but PAP was done in November -Medications previously tried:   -Recommend [phone] units of vitamin D daily. Recommend 1200 mg of calcium daily from dietary and supplemental sources. December 2023: Will have CCM team look into Teriparatide PAP    Patient Goals/Self-Care Activities Patient will:  - take medications as prescribed as evidenced by patient report and record review  Follow Up Plan: The patient has been provided with contact information for the care management team and has been advised to call with any health related questions or concerns.   CPP F/U March 2024  [place] Constable, Sherian Rein.D. - [phone]       Ms. Kuhl was given information about Chronic Care Management services today including:  CCM service includes personalized support from designated clinical staff supervised by her physician, including individualized plan of care and coordination with other care providers 24/7 contact phone numbers for assistance for urgent and routine care needs. Standard insurance, coinsurance, copays and deductibles apply for chronic care management only during months in which we provide at least 20 minutes of these services. Most insurances cover these services at 100%, however patients may be responsible for any copay, coinsurance and/or deductible if applicable. This service may help you avoid the need for more expensive face-to-face services. Only one practitioner may furnish and bill the service in a calendar month. The patient may stop CCM services at any time (effective at the end of the month) by phone call to the office staff.  Patient agreed to services  and verbal consent obtained.   The patient verbalized understanding of instructions, educational materials, and care plan provided today and DECLINED offer to receive copy of  patient instructions, educational materials, and care plan.  The pharmacy team will reach out to the patient again over the next 60 days.   Lane Hacker, [place]

## 2022-12-02 ENCOUNTER — Telehealth: Payer: Self-pay | Admitting: *Deleted

## 2022-12-02 ENCOUNTER — Other Ambulatory Visit: Payer: Self-pay | Admitting: Family Medicine

## 2022-12-02 ENCOUNTER — Other Ambulatory Visit: Payer: Self-pay

## 2022-12-02 DIAGNOSIS — M5416 Radiculopathy, lumbar region: Secondary | ICD-10-CM | POA: Diagnosis not present

## 2022-12-02 MED ORDER — AMOXICILLIN-POT CLAVULANATE 875-125 MG PO TABS: 1.0000 | ORAL_TABLET | Freq: Two times a day (BID) | ORAL | 0 refills | Status: DC

## 2022-12-02 NOTE — Progress Notes (Signed)
  Care Coordination   Note   12/02/2022 Name: Victoria Pena MRN: [id number] DOB: [birth year]  [name] is a 71 y.o. year old female who sees Lillard Anes, MD for primary care. I reached out to [place] by phone today to offer care coordination services.  [name] was given information about Care Coordination services today including:   The Care Coordination services include support from the care team which includes your Nurse Coordinator, Clinical Social Worker, or Pharmacist.  The Care Coordination team is here to help remove barriers to the health concerns and goals most important to you. Care Coordination services are voluntary, and the patient may decline or stop services at any time by request to their care team member.   Care Coordination Consent Status: Patient agreed to services and verbal consent obtained.   Follow up plan:  Telephone appointment with care coordination team member scheduled for:  12/06/2022  Encounter Outcome:  Pt. Scheduled from referral   [name], [place] Direct Dial: [phone]

## 2022-12-02 NOTE — Progress Notes (Signed)
Patient called Victoria Pena with BP readings   12/01/22: 130/91, 161/93, 181/119, 171/110, 163/107,  12/02/22: 129/90  Sent direct msg to Dr. Henrene Pastor and will tag in this note  Told patient to start taking BP daily and to call me if it goes above 258 systolic again. Told her to call me in a week with BP readings

## 2022-12-03 ENCOUNTER — Ambulatory Visit (INDEPENDENT_AMBULATORY_CARE_PROVIDER_SITE_OTHER): Payer: Medicare PPO | Admitting: Nurse Practitioner

## 2022-12-03 ENCOUNTER — Encounter: Payer: Self-pay | Admitting: Nurse Practitioner

## 2022-12-03 VITALS — BP 130/90 | HR 76 | Temp 97.2°F | Resp 16 | Ht 65.0 in | Wt 179.6 lb

## 2022-12-03 DIAGNOSIS — M797 Fibromyalgia: Secondary | ICD-10-CM

## 2022-12-03 DIAGNOSIS — F418 Other specified anxiety disorders: Secondary | ICD-10-CM

## 2022-12-03 DIAGNOSIS — I10 Essential (primary) hypertension: Secondary | ICD-10-CM

## 2022-12-03 DIAGNOSIS — J439 Emphysema, unspecified: Secondary | ICD-10-CM

## 2022-12-03 MED ORDER — LOSARTAN POTASSIUM-HCTZ 100-12.5 MG PO TABS: 1.0000 | ORAL_TABLET | Freq: Every day | ORAL | 0 refills | Status: DC

## 2022-12-03 MED ORDER — CARIPRAZINE HCL 1.5 MG PO CAPS: 1.5000 mg | ORAL_CAPSULE | Freq: Every day | ORAL | 0 refills | Status: AC

## 2022-12-03 MED ORDER — BUPROPION HCL ER (XL) 150 MG PO TB24: 150.0000 mg | ORAL_TABLET | Freq: Every day | ORAL | 0 refills | Status: DC

## 2022-12-03 MED ORDER — ALBUTEROL SULFATE HFA 108 (90 BASE) MCG/ACT IN AERS: 2.0000 | INHALATION_SPRAY | Freq: Four times a day (QID) | RESPIRATORY_TRACT | 0 refills | Status: DC | PRN

## 2022-12-03 NOTE — Patient Instructions (Addendum)
Follow up in 2 weeks to see the effects of the medicine   Cariprazine Capsules What is this medication? CARIPRAZINE (car i PRA zeen) treats schizophrenia and bipolar disorder. It may also be used with antidepressant medication to treat depression. It works by balancing the levels of dopamine and serotonin in your brain, substances that help regulate mood, behaviors, and thoughts. It belongs to a group of medications called antipsychotics. Antipsychotic medications can be used to treat several kinds of mental health conditions. This medicine may be used for other purposes; ask your health care provider or pharmacist if you have questions. COMMON BRAND NAME(S): VRAYLAR What should I tell my care team before I take this medication? They need to know if you have any of these conditions: Dementia Diabetes Difficulty swallowing Have trouble controlling your muscles Heart disease High cholesterol History of breast cancer History of stroke Kidney disease Liver disease Low blood counts, like low white cell, platelet, or red cell counts Low blood pressure Parkinson's disease Seizures Suicidal thoughts, plans or attempt; a previous suicide attempt by you or a family member An unusual or allergic reaction to cariprazine, other medications, foods, dyes, or preservatives Pregnant or trying to get pregnant Breast-feeding How should I use this medication? Take this medication by mouth with a glass of water. Follow the directions on the prescription label. You may take it with or without food. Take your medication at regular intervals. Do not take it more often than directed. Do not stop taking except on your care team's advice. A special MedGuide will be given to you by the pharmacist with each prescription and refill. Be sure to read this information carefully each time. Talk to your care team about the use of this medication in children. Special care may be needed. Overdosage: If you think you have  taken too much of this medicine contact a poison control center or emergency room at once. NOTE: This medicine is only for you. Do not share this medicine with others. What if I miss a dose? If you miss a dose, take it as soon as you can. If it is almost time for your next dose, take only that dose. Do not take double or extra doses. What may interact with this medication? Do not take this medication with any of the following: Metoclopramide This medication may also interact with the following: Antihistamines for allergy, cough, and cold Carbamazepine Certain medications for anxiety or sleep Certain medications for depression like amitriptyline, fluoxetine, sertraline Certain medications for fungal infections like itraconazole, ketoconazole General anesthetics like halothane, isoflurane, methoxyflurane, propofol Levodopa or other medications for Parkinson's disease Medications for blood pressure Medications for seizures Medications that relax muscles for surgery Narcotic medications for pain Phenothiazines like chlorpromazine, prochlorperazine, thioridazine Rifampin This list may not describe all possible interactions. Give your health care provider a list of all the medicines, herbs, non-prescription drugs, or dietary supplements you use. Also tell them if you smoke, drink alcohol, or use illegal drugs. Some items may interact with your medicine. What should I watch for while using this medication? Visit your care team for regular checks on your progress. Tell your care team if symptoms do not start to get better or if they get worse. Do not stop taking except on your care team's advice. You may develop a severe reaction. Your care team will tell you how much medication to take. Patients and their families should watch out for new or worsening depression or thoughts of suicide. Also watch out for  sudden changes in feelings such as feeling anxious, agitated, panicky, irritable, hostile,  aggressive, impulsive, severely restless, overly excited and hyperactive, or not being able to sleep. If this happens, especially at the beginning of treatment or after a change in dose, call your care team. You may get dizzy or drowsy. Do not drive, use machinery, or do anything that needs mental alertness until you know how this medication affects you. Do not stand or sit up quickly, especially if you are an older patient. This reduces the risk of dizzy or fainting spells. Alcohol may interfere with the effect of this medication. Avoid alcoholic drinks. This medication may cause dry eyes and blurred vision. If you wear contact lenses you may feel some discomfort. Lubricating drops may help. See your eye doctor if the problem does not go away or is severe. This medication may increase blood sugar. Ask your care team if changes in diet or medications are needed if you have diabetes. This medication can cause problems with controlling your body temperature. It can lower the response of your body to cold temperatures. If possible, stay indoors during cold weather. If you must go outdoors, wear warm clothes. It can also lower the response of your body to heat. Do not overheat. Do not over-exercise. Stay out of the sun when possible. If you must be in the sun, wear cool clothing. Drink plenty of water. If you have trouble controlling your body temperature, call your care team right away. Women should inform their care team if they wish to become pregnant or think they might be pregnant. The effects of this medication on an unborn child are not known. A registry is available to monitor pregnancy outcomes in pregnant women exposed to this medication or similar medications. Talk to your care team or pharmacist for more information. What side effects may I notice from receiving this medication? Side effects that you should report to your care team as soon as possible: Allergic reactions--skin rash, itching, hives,  swelling of the face, lips, tongue, or throat High blood sugar (hyperglycemia)--increased thirst or amount of urine, unusual weakness or fatigue, blurry vision High fever, stiff muscles, increased sweating, fast or irregular heartbeat, and confusion, which may be signs of neuroleptic malignant syndrome Infection--fever, chills, cough, or sore throat Low blood pressure--dizziness, feeling faint or lightheaded, blurry vision Pain or trouble swallowing Seizures Stroke--sudden numbness or weakness of the face, arm, or leg, trouble speaking, confusion, trouble walking, loss of balance or coordination, dizziness, severe headache, change in vision Thoughts of suicide or self-harm, worsening mood, feelings of depression Uncontrolled and repetitive body movements, muscle stiffness or spasms, tremors or shaking, loss of balance or coordination, restlessness, shuffling walk, which may be signs of extrapyramidal symptoms (EPS) Side effects that usually do not require medical attention (report to your care team if they continue or are bothersome): Constipation Dizziness Drowsiness Nausea Trouble sleeping Upset stomach Vomiting This list may not describe all possible side effects. Call your doctor for medical advice about side effects. You may report side effects to FDA at 1-800-FDA-1088. Where should I keep my medication? Keep out of the reach of children. Store at room temperature between 15 and 30 degrees C (59 and 86 degrees F). Protect from light. Throw away any unused medication after the expiration date. NOTE: This sheet is a summary. It may not cover all possible information. If you have questions about this medicine, talk to your doctor, pharmacist, or health care provider.  2023 Elsevier/Gold Standard (2021-06-18 00:00:00)

## 2022-12-03 NOTE — Assessment & Plan Note (Addendum)
Patient is taking Lyrica 150 mg twice daily.

## 2022-12-03 NOTE — Assessment & Plan Note (Signed)
Not well controlled 140/88 first reading 130/90 second reading Patient is taking Losartan 100 mg OD,  Added HYDROCHLOROTHIAZIDE 12.5 mg  Nutrition: Stressed importance of moderation in sodium intake, saturated fat and cholesterol, caloric balance, sufficient intake of complex carbohydrates, fiber, calcium and iron.   Exercise: Stressed the importance of regular exercise.

## 2022-12-03 NOTE — Assessment & Plan Note (Signed)
Depression and anxiety not under controlled Stopped Abilify Given Vraylar sample for 14 days Follow up in 2 weeks  Patient is taking Wellbutrin XL 150 mg Refer patient  to the [facility] for her other psychological issues

## 2022-12-03 NOTE — Progress Notes (Signed)
Acute Office Visit  Subjective:    Patient ID: Victoria Pena, female    DOB: Feb 16, 1951, 71 y.o.   MRN: [id number]  Chief Complaint  Patient presents with   discuss medication    HPI: Patient is concerned about her Abilify causing her to be very tired even while she is driving.  She feels like she is falling asleep easily and being confused. Patient was taking 5 mg for a months, she felt it constantly making patient feel sleepy, she is saying her depression and anxiety is still under the roof and looking for some solutions.  Patient said she recently quitted job because of arthralgia and other joint problems and living with limited income.  Ovid Curd Pharmacist increased Wellbutrin XL to 300 mg but she could not tolerate that and she is taking 150 mg, requesting for refill.  She also complained SHORTNESS OF BREATH every once in a while and requesting for a albuterol puff.  Past Medical History:  Diagnosis Date   Acquired spondylolisthesis 05/12/2015   Adenocarcinoma of right lung, stage 1 ([place]) 07/26/2016   Anxiety    Arthropathy of lumbar facet joint 04/11/2019   Formatting of this note might be different from the original. Added automatically from request for surgery [id number]   Atherosclerosis of aorta ([place]) 07/08/2021   Benign essential hypertension 12/27/2016   BMI 30.0-30.9,adult 12/23/2020   COPD (chronic obstructive pulmonary disease) ([place])    pt denies   Current mild episode of major depressive disorder ([place]) 03/24/2020   Daytime sleepiness 08/03/2021   Depression    Fibromyalgia 03/24/2020   Gastroesophageal reflux disease without esophagitis 03/24/2020   Graves disease 12/27/2016   Hypertension    Osteoporosis 08/03/2021   Pericardial cyst 02/02/2021   Postoperative hypothyroidism 01/17/2015   Reactive airways dysfunction syndrome, unspecified asthma severity, uncomplicated ([place]) 8/65/7846   S/P lobectomy of lung 07/26/2015   Formatting of this note might be different from the  original. Right upper   S/P lumbar spinal fusion 05/11/2018   Thyroid disease    Toe ulcer, left, limited to breakdown of skin ([place]) 12/23/2020    Past Surgical History:  Procedure Laterality Date   ANKLE SURGERY     bilateral   APPENDECTOMY     BREAST BIOPSY Left 02/25/2020   BREAST BIOPSY Right 08/17/2018   BREAST EXCISIONAL BIOPSY Right    unsure when but marked with scar marker   CHOLECYSTECTOMY     REPLACEMENT TOTAL KNEE Left    THYROIDECTOMY      Family History  Problem Relation Age of Onset   Cancer Mother    Cancer Father    Cancer Brother    Autism Grandson     Social History   Socioeconomic History   Marital status: Widowed    Spouse name: Not on file   Number of children: Not on file   Years of education: Not on file   Highest education level: Not on file  Occupational History   Not on file  Tobacco Use   Smoking status: Former    Packs/day: 1.50    Years: 35.00    Total pack years: 52.50    Types: Cigarettes    Quit date: 07/27/1999    Years since quitting: 23.3   Smokeless tobacco: Never  Vaping Use   Vaping Use: Never used  Substance and Sexual Activity   Alcohol use: No   Drug use: No   Sexual activity: Not Currently  Other Topics Concern   Not  on file  Social History Narrative   Not on file   Social Determinants of Health   Financial Resource Strain: High Risk (11/30/2022)   Overall Financial Resource Strain (CARDIA)    Difficulty of Paying Living Expenses: Hard  Food Insecurity: No Food Insecurity (04/27/2022)   Hunger Vital Sign    Worried About Running Out of Food in the Last Year: Never true    Ran Out of Food in the Last Year: Never true  Transportation Needs: No Transportation Needs (11/30/2022)   PRAPARE - Hydrologist (Medical): No    Lack of Transportation (Non-Medical): No  Physical Activity: Sufficiently Active (04/27/2022)   Exercise Vital Sign    Days of Exercise per Week: 5 days    Minutes of  Exercise per Session: 30 min  Stress: No Stress Concern Present (04/27/2022)   [place]    Feeling of Stress : Not at all  Social Connections: Socially Isolated (04/27/2022)   Social Connection and Isolation Panel [NHANES]    Frequency of Communication with Friends and Family: More than three times a week    Frequency of Social Gatherings with Friends and Family: Twice a week    Attends Religious Services: Never    Marine scientist or Organizations: Not on file    Attends Archivist Meetings: Never    Marital Status: Widowed  Intimate Partner Violence: Not At Risk (04/27/2022)   Humiliation, Afraid, Rape, and Kick questionnaire    Fear of Current or Ex-Partner: No    Emotionally Abused: No    Physically Abused: No    Sexually Abused: No    Outpatient Medications Prior to Visit  Medication Sig Dispense Refill   ALPRAZolam (XANAX) 0.5 MG tablet TAKE 1 TABLET(0.5 MG) BY MOUTH TWICE DAILY AS NEEDED FOR ANXIETY 60 tablet 3   amoxicillin-clavulanate (AUGMENTIN) 875-125 MG tablet Take 1 tablet by mouth 2 (two) times daily. 14 tablet 0   atorvastatin (LIPITOR) 40 MG tablet TAKE 1 TABLET(40 MG) BY MOUTH DAILY 90 tablet 2   Calcium Carb-Cholecalciferol (CALCIUM 500 + D3) 500-5 MG-MCG TABS Take 3 tablets by mouth daily. 100 tablet 5   calcium carbonate (TUMS - DOSED IN MG ELEMENTAL CALCIUM) 500 MG chewable tablet Chew 1 tablet by mouth 2 (two) times daily as needed for indigestion or heartburn.     fluticasone (FLONASE) 50 MCG/ACT nasal spray Place 2 sprays into both nostrils daily. 16 g 6   ibuprofen (ADVIL,MOTRIN) 200 MG tablet Take 600 mg by mouth 2 (two) times daily as needed for mild pain.     levothyroxine (SYNTHROID) 125 MCG tablet TAKE 1 TABLET(125 MCG) BY MOUTH DAILY 30 tablet 3   metroNIDAZOLE (METROGEL) 0.75 % gel Apply 1 application topically 2 (two) times daily. 45 g 3   mupirocin ointment (BACTROBAN) 2 %  Apply 1 Application topically 2 (two) times daily. 22 g 0   oxyCODONE-acetaminophen (PERCOCET) 10-325 MG tablet Take 1 tablet by mouth every 8 (eight) hours as needed for pain. 90 tablet 0   pantoprazole (PROTONIX) 40 MG tablet Take 1 tablet (40 mg total) by mouth daily. 90 tablet 2   pregabalin (LYRICA) 150 MG capsule TAKE 1 CAPSULE(150 MG) BY MOUTH TWICE DAILY 60 capsule 3   Spacer/Aero-Holding Chambers (AEROCHAMBER MAX W/FLOW-VU) MISC Use as directed. 1 each 0   Teriparatide, Recombinant, (FORTEO) 600 MCG/2.4ML SOPN Inject 20 mcg into the skin daily. 2.4 mL 6  ARIPiprazole (ABILIFY) 10 MG tablet Take 1 tablet (10 mg total) by mouth daily. 90 tablet 2   buPROPion (WELLBUTRIN XL) 150 MG 24 hr tablet      losartan (COZAAR) 100 MG tablet TAKE 1 TABLET(100 MG) BY MOUTH DAILY 90 tablet 1   doxycycline (VIBRA-TABS) 100 MG tablet Take 1 tablet (100 mg total) by mouth 2 (two) times daily. (Patient not taking: Reported on 12/03/2022) 20 tablet 0   No facility-administered medications prior to visit.    Allergies  Allergen Reactions   Keflex [Cephalexin] Shortness Of Breath    Hard time breathing   Robaxin [Methocarbamol] Nausea Only    Stomach pain . Could not eat   Bactrim [Sulfamethoxazole-Trimethoprim] Itching   Hydrocodone-Acetaminophen Anxiety    Face flushed, high blood pressure, headache, shaking.   Hydroxyzine Other (See Comments) and Swelling    Numbness in lips   Prednisone & Diphenhydramine Other (See Comments)    Face flushed, heart racing   Amlodipine Other (See Comments)    Bp worsened. Shaky. Insomnia.   Ciprofloxacin    Dextromethorphan Polistirex Er    Nitrofurantoin    Pravastatin Other (See Comments)    joint pains   Diphenhydramine Hcl Palpitations   Moxifloxacin Rash   Prednisone Other (See Comments) and Palpitations    "flushing"     Review of Systems  Constitutional:  Negative for chills and fatigue.  HENT:  Negative for congestion, ear pain, sinus pain  and sore throat.   Respiratory:  Negative for cough and shortness of breath.   Cardiovascular:  Negative for chest pain and leg swelling.  Gastrointestinal:  Negative for abdominal pain, constipation, diarrhea, nausea and vomiting.  Genitourinary:  Negative for dysuria and frequency.  Musculoskeletal:  Negative for arthralgias, back pain and myalgias.  Neurological:  Negative for dizziness and headaches.      12/03/2022   11:11 AM 10/12/2022    1:53 PM 10/12/2022    1:48 PM  Depression screen PHQ 2/9  Decreased Interest _0 Down, Depressed, Hopeless _1 PHQ - 2 Score _2 Altered sleeping 2 0 0  Tired, decreased energy _3 Change in appetite 2 0 0  Feeling bad or failure about yourself  1 0 0  Trouble concentrating _4 Moving slowly or fidgety/restless _5 Suicidal thoughts 2 0 0  PHQ-9 Score _6 Difficult doing work/chores  Not difficult at all Not difficult at all       12/03/2022   11:42 AM 03/24/2020    2:03 PM  GAD 7 : Generalized Anxiety Score  Nervous, Anxious, on Edge 2 2  Control/stop worrying 2 1  Worry too much - different things 3 1  Trouble relaxing 2 2  Restless 3 2  Easily annoyed or irritable 2 2  Afraid - awful might happen 2 2  Total GAD 7 Score 16 12  Anxiety Difficulty  Very difficult        Objective:    Physical Exam Vitals reviewed.  Constitutional:      Appearance: Normal appearance. She is normal weight.  Neck:     Vascular: No carotid bruit.  Cardiovascular:     Rate and Rhythm: Normal rate and regular rhythm.     Heart sounds: Normal heart sounds.  Pulmonary:     Effort: Pulmonary effort is normal.     Breath sounds: Normal breath sounds.  Abdominal:  General: Abdomen is flat. Bowel sounds are normal.     Palpations: Abdomen is soft.     Tenderness: There is no abdominal tenderness.  Musculoskeletal:     Cervical back: Normal range of motion.  Neurological:     Mental Status: She is alert and oriented  to person, place, and time.  Psychiatric:        Mood and Affect: Mood normal.        Behavior: Behavior normal.    BP (!) 140/88   Pulse 75   Temp (!) 97.2 F (36.2 C)   Resp 16   Ht _0  (1.651 m)   Wt 179 lb 9.6 oz (81.5 kg)   LMP  (LMP Unknown)   SpO2 98%   BMI 29.89 kg/m  BP (!) 130/90 (BP Location: Left Arm)   Pulse 76   Temp (!) 97.2 F (36.2 C)   Resp 16   Ht _1  (1.651 m)   Wt 179 lb 9.6 oz (81.5 kg)   LMP  (LMP Unknown)   SpO2 98%   BMI 29.89 kg/m    Wt Readings from Last 3 Encounters:  12/03/22 179 lb 9.6 oz (81.5 kg)  11/24/22 175 lb (79.4 kg)  11/03/22 181 lb (82.1 kg)    Health Maintenance Due  Topic Date Due   Hepatitis C Screening  Never done   DTaP/Tdap/Td (1 - Tdap) Never done   Zoster Vaccines- Shingrix (1 of 2) Never done   MAMMOGRAM  06/15/2022    There are no preventive care reminders to display for this patient.   Lab Results  Component Value Date   TSH 2.920 08/10/2022   Lab Results  Component Value Date   WBC 4.8 08/10/2022   HGB 13.9 08/10/2022   HCT 42.9 08/10/2022   MCV 90 08/10/2022   PLT 263 08/10/2022   Lab Results  Component Value Date   NA 143 10/12/2022   K 4.6 10/12/2022   CHLORIDE 107 11/22/2017   CO2 25 10/12/2022   GLUCOSE 100 (H) 10/12/2022   BUN 22 10/12/2022   CREATININE 0.70 10/12/2022   BILITOT 0.4 10/12/2022   ALKPHOS 166 (H) 10/12/2022   AST 14 10/12/2022   ALT 14 10/12/2022   PROT 6.7 10/12/2022   ALBUMIN 3.9 10/12/2022   CALCIUM 9.6 10/12/2022   ANIONGAP 9 11/06/2021   EGFR 92 10/12/2022   Lab Results  Component Value Date   CHOL 154 10/12/2022   Lab Results  Component Value Date   HDL 82 10/12/2022   Lab Results  Component Value Date   LDLCALC 59 10/12/2022   Lab Results  Component Value Date   TRIG 67 10/12/2022   Lab Results  Component Value Date   CHOLHDL 1.9 10/12/2022   No results found for: "HGBA1C"     Assessment & Plan:   Problem List Items Addressed This  Visit       Cardiovascular and Mediastinum   Benign essential hypertension    Not well controlled 140/88 first reading 130/90 second reading Patient is taking Losartan 100 mg OD,  Added HYDROCHLOROTHIAZIDE 12.5 mg  Nutrition: Stressed importance of moderation in sodium intake, saturated fat and cholesterol, caloric balance, sufficient intake of complex carbohydrates, fiber, calcium and iron.   Exercise: Stressed the importance of regular exercise.         Relevant Medications   losartan-hydrochlorothiazide (HYZAAR) 100-12.5 MG tablet     Respiratory   COPD (chronic obstructive pulmonary disease) ([place])  Patient has once in a while SHORTNESS OF BREATH  albuterol puff as needed      Relevant Medications   albuterol (VENTOLIN HFA) 108 (90 Base) MCG/ACT inhaler     Other   Depression with anxiety - Primary    Depression and anxiety not under controlled Stopped Abilify Given Vraylar sample for 14 days Follow up in 2 weeks  Patient is taking Wellbutrin XL 150 mg Refer patient  to the [place] for her other psychological issues      Relevant Medications   buPROPion (WELLBUTRIN XL) 150 MG 24 hr tablet   cariprazine (VRAYLAR) 1.5 MG capsule   Fibromyalgia    Patient is taking Lyrica 150 mg twice daily.       Relevant Medications   buPROPion (WELLBUTRIN XL) 150 MG 24 hr tablet   Meds ordered this encounter  Medications   losartan-hydrochlorothiazide (HYZAAR) 100-12.5 MG tablet    Sig: Take 1 tablet by mouth daily.    Dispense:  90 tablet    Refill:  0    Order Specific Question:   Supervising Provider    Answer:   Shelton Silvas   buPROPion (WELLBUTRIN XL) 150 MG 24 hr tablet    Sig: Take 1 tablet (150 mg total) by mouth daily.    Dispense:  90 tablet    Refill:  0    Order Specific Question:   Supervising Provider    Answer:   Shelton Silvas   cariprazine (VRAYLAR) 1.5 MG capsule    Sig: Take 1 capsule (1.5 mg total) by mouth daily for 14  days.    Dispense:  14 capsule    Refill:  0    Order Specific Question:   Supervising Provider    Answer:   Shelton Silvas   albuterol (VENTOLIN HFA) 108 (90 Base) MCG/ACT inhaler    Sig: Inhale 2 puffs into the lungs every 6 (six) hours as needed for wheezing or shortness of breath.    Dispense:  8 g    Refill:  0    Order Specific Question:   Supervising Provider    Answer:   Shelton Silvas    No orders of the defined types were placed in this encounter.    Follow-up: in 2 weeks to discuss the effect of the Vraylar medicine.  An After Visit Summary was printed and given to the patient. I, Neil Crouch have reviewed all documentation for this visit. The documentation on 12/03/22   for the exam, diagnosis, procedures, and orders are all accurate and complete.     Neil Crouch, DNP, FNP-BC, FNP-C [facility] [phone]

## 2022-12-03 NOTE — Assessment & Plan Note (Signed)
Patient has once in a while SHORTNESS OF BREATH  albuterol puff as needed

## 2022-12-06 ENCOUNTER — Ambulatory Visit: Payer: Self-pay

## 2022-12-06 NOTE — Patient Instructions (Signed)
Visit Information  Thank you for taking time to visit with me today. Please don't hesitate to contact me if I can be of assistance to you.   Following are the goals we discussed today:   Goals Addressed             This Visit's Progress    COMPLETED: Care Coordination Activities       Care Coordination Interventions: Discussed the patient recently had to stop working due to health related concerns and is now experiencing financial strain Patient reports she has concerns with utility costs, food insecurity, and needing an aid in the home. Patient also has ongoing Depression which has not been well managed by medications Reviewed the patient has applied for Medicaid and is awaiting a determination Education provided on the benefits of Managed Medicaid including assistance with utility payments and cost of an in home aid Determined the patient currently receives $23 each month from [employer] and is aware of local food pantry locations Discussed the patient has applied for LIEAP and was told she was over the income by $200 to qualify. This was while patient was working so she may qualify now - advised to contact DSS Provided patient with resource education and contact number to [employer] to be placed on the wait list for an in home aid Advised the patient that if/when she is approved for Medicaid she may apply for PCS Determined the patient is interested in speak with LCSW Eduard Clos about her depression Scheduled initial outreach with LCSW for 12/20 at 1:00pm Collaboration with LCSW to advise of above interventions and plan for patient to speak with LCSW to address clinical needs          Your next appointment is by telephone on 12/20 at 1:00 pm with LCSW Eduard Clos.  Please call the care guide team at [phone] if you need to cancel or reschedule your appointment.   If you are experiencing a Mental Health or [place]  or need someone to talk to, please call 1-800-273-TALK (toll free, 24 hour hotline) call 911  Patient verbalizes understanding of instructions and care plan provided today and agrees to view in [place]. Active MyChart status and patient understanding of how to access instructions and care plan via MyChart confirmed with patient.     Telephone follow up appointment with care management team member scheduled for:12/20  Daneen Schick, [place], CDP Social Worker, Certified Dementia Practitioner [place] [facility]  Care Coordination [phone]

## 2022-12-06 NOTE — Patient Outreach (Signed)
  Care Coordination   Initial Visit Note   12/06/2022 Name: Victoria Pena MRN: [id number] DOB: [birth year]  Victoria Pena is a 71 y.o. year old female who sees Neil Crouch, FNP for primary care. I spoke with  Roe Rutherford Eichenberger by phone today.  What matters to the patients health and wellness today?  Resource education and addressing depression    Goals Addressed             This Visit's Progress    COMPLETED: Care Coordination Activities       Care Coordination Interventions: Discussed the patient recently had to stop working due to health related concerns and is now experiencing financial strain Patient reports she has concerns with utility costs, food insecurity, and needing an aid in the home. Patient also has ongoing Depression which has not been well managed by medications Reviewed the patient has applied for Medicaid and is awaiting a determination Education provided on the benefits of Managed Medicaid including assistance with utility payments and cost of an in home aid Determined the patient currently receives $23 each month from [employer] and is aware of local food pantry locations Discussed the patient has applied for LIEAP and was told she was over the income by $200 to qualify. This was while patient was working so she may qualify now - advised to contact DSS Provided patient with resource education and contact number to [employer] to be placed on the wait list for an in home aid Advised the patient that if/when she is approved for Medicaid she may apply for PCS Determined the patient is interested in speak with LCSW Eduard Clos about her depression Scheduled initial outreach with LCSW for 12/20 at 1:00pm Collaboration with LCSW to advise of above interventions and plan for patient to speak with LCSW to address clinical needs          SDOH assessments and interventions completed:  Yes  SDOH Interventions Today     Flowsheet Row Most Recent Value  SDOH Interventions   Housing Interventions Intervention Not Indicated  Transportation Interventions Intervention Not Indicated  Utilities Interventions Other (Comment)  [patient is concerned she will be unable to afford her utility bill at the end of the month]        Care Coordination Interventions:  Yes, provided   Follow up plan: Follow up call scheduled for 12/20 with LCSW    Encounter Outcome:  Pt. Visit Completed   Daneen Schick, [place], CDP Social Worker, Certified Dementia Practitioner [place] Management  Care Coordination [phone]

## 2022-12-06 NOTE — Patient Outreach (Signed)
Error

## 2022-12-08 ENCOUNTER — Other Ambulatory Visit: Payer: Self-pay

## 2022-12-08 ENCOUNTER — Ambulatory Visit: Payer: Self-pay | Admitting: Licensed Clinical Social Worker

## 2022-12-08 ENCOUNTER — Encounter: Payer: Medicare PPO | Admitting: *Deleted

## 2022-12-08 ENCOUNTER — Other Ambulatory Visit: Payer: Self-pay | Admitting: Neurological Surgery

## 2022-12-08 DIAGNOSIS — F419 Anxiety disorder, unspecified: Secondary | ICD-10-CM

## 2022-12-08 DIAGNOSIS — M797 Fibromyalgia: Secondary | ICD-10-CM

## 2022-12-08 DIAGNOSIS — K219 Gastro-esophageal reflux disease without esophagitis: Secondary | ICD-10-CM

## 2022-12-08 DIAGNOSIS — F418 Other specified anxiety disorders: Secondary | ICD-10-CM

## 2022-12-08 DIAGNOSIS — C3491 Malignant neoplasm of unspecified part of right bronchus or lung: Secondary | ICD-10-CM

## 2022-12-08 NOTE — Patient Outreach (Signed)
  Care Coordination   Initial Visit Note   12/08/2022 Name: Victoria Pena MRN: [id number] DOB: [birth year]  Victoria Pena is a 71 y.o. year old female who sees Neil Crouch, FNP for primary care. I spoke with  Victoria Pena by phone today.  What matters to the patients health and wellness today? Client feels overwhelmed sometimes and has depression issues. Has challenges in meeting daily needs    Goals Addressed               This Visit's Progress     Patient Stated she feels overwhelmed sometimes and has depresion issues. She has challenges with meeting daily care needs (pt-stated)        Interventions: Talked with client today about her needs. She spoke of numerous needs across SDOH spectrum.  She has car she uses for transport; food scarcity is issue. She is aware of food pantries in area and can go there as needed. She has financial stress issues. She has sometimes to ask for financial help from family members. She is concerned over utility costs.  Discussed Medicaid application. She has applied for Medicaid and is planning to talk with Medicaid worker about Managed Medicaid. Discussed in home care needs. BSW Daneen Schick provided client information on [employer] for putting client  name on wait list for in home aide support . Client plans to call [employer] today to add her name to wait list for in home aide support Discussed mood, She said that managing depression has been challenging. She is no longer working and because of this she is facing financial challenges. She had been working at agency for 4 years. Client uses relaxation techniques of :  reading, art projects Discussed pain issues. She has joint pain, pain issues from her waist to her feet. She is talking with medical provider about possible back surgery in near future.  She cannot stoop over without difficulty and this prevents her from doing cleaning in her home. Client has support from her  daughter.   LCSW to send order to [place] for client resources to be mailed to client  regtarding:  food resources, transport help, utility help, rent assistance Provided counseling support to client. Client is aware that LCSW Eduard Clos will be contacting her in coming weeks to discus her ongoing needs. Client was appreciative of call and has good understanding of her overall needs.        SDOH assessments and interventions completed:  Yes  SDOH Interventions Today    Flowsheet Row Most Recent Value  SDOH Interventions   Depression Interventions/Treatment  Counseling  Physical Activity Interventions Other (Comments)  [walking challenges,  she has a cane and walker to use to help her walk]  Stress Interventions Provide Counseling  [client has anxiety issues, client has stress related to finances]        Care Coordination Interventions:  Yes, provided   Follow up plan: Follow up call scheduled for 12/29/22 at 1:00 with [place]    Encounter Outcome:  Pt. Visit Completed   Norva Riffle.Clifton Kovacic MSW, [place] Holiday representative [facility] Care Management [phone]

## 2022-12-08 NOTE — Patient Instructions (Signed)
Visit Information  Thank you for taking time to visit with me today. Please don't hesitate to contact me if I can be of assistance to you before our next scheduled telephone appointment.  Following are the goals we discussed today:    Our next appointment is by telephone on 12/29/22 at 1:00 PM   Please call the care guide team at [phone] if you need to cancel or reschedule your appointment.   If you are experiencing a Mental Health or [facility] or need someone to talk to, please call 1-800-273-TALK (toll free, 24 hour hotline)   Following is a copy of your full plan of care:   Interventions: Talked with client today about her needs. She spoke of numerous needs across SDOH spectrum.  She has car she uses for transport; food scarcity is issue. She is aware of food pantries in area and can go there as needed. She has financial stress issues. She has sometimes to ask for financial help from family members. She is concerned over utility costs.  Discussed Medicaid application. She has applied for Medicaid and is planning to talk with Medicaid worker about Managed Medicaid. Discussed in home care needs. BSW Daneen Schick provided client information on [employer] for putting client  name on wait list for in home aide support . Client plans to call [facility] today to add her name to wait list for in home aide support Discussed mood, She said that managing depression has been challenging. She is no longer working and because of this she is facing financial challenges. She had been working at agency for 4 years. Client uses relaxation techniques of :  reading, art projects Discussed pain issues. She has joint pain, pain issues from her waist to her feet. She is talking with medical provider about possible back surgery in near future.  She cannot stoop over without difficulty and this prevents her from doing cleaning in her home. Client has support from her  daughter.   LCSW to send order to [place] for client resources to be mailed to client  regtarding:  food resources, transport help, utility help, rent assistance Provided counseling support to client. Client is aware that LCSW Eduard Clos will be contacting her in coming weeks to discus her ongoing needs. Client was appreciative of call and has good understanding of her overall needs.   [name] was given information about Care Management services by the embedded care coordination team including:  Care Management services include personalized support from designated clinical staff supervised by her physician, including individualized plan of care and coordination with other care providers 24/7 contact phone numbers for assistance for urgent and routine care needs. The patient may stop CCM services at any time (effective at the end of the month) by phone call to the office staff.  Patient agreed to services and verbal consent obtained.   Norva Riffle.Sekai Gitlin MSW, [place] [facility] representative [facility] Care Management [phone]

## 2022-12-09 ENCOUNTER — Telehealth: Payer: Self-pay | Admitting: *Deleted

## 2022-12-09 ENCOUNTER — Telehealth: Payer: Self-pay

## 2022-12-09 ENCOUNTER — Other Ambulatory Visit: Payer: Self-pay | Admitting: Nurse Practitioner

## 2022-12-09 ENCOUNTER — Other Ambulatory Visit: Payer: Self-pay | Admitting: Family Medicine

## 2022-12-09 DIAGNOSIS — I10 Essential (primary) hypertension: Secondary | ICD-10-CM

## 2022-12-09 MED ORDER — LOSARTAN POTASSIUM 50 MG PO TABS: 50.0000 mg | ORAL_TABLET | Freq: Every day | ORAL | 0 refills | Status: DC

## 2022-12-09 NOTE — Telephone Encounter (Signed)
Patient was informed that her Losartan was changed to 50 mg daily and HCTZ was discontinued.

## 2022-12-09 NOTE — Telephone Encounter (Signed)
   Telephone encounter was:  Unsuccessful.  12/09/2022 Name: Charman Blasco MRN: [id number] DOB: 26-Nov-1951  Unsuccessful outbound call made today to assist with:  Transportation Needs  and Food Insecurity  Outreach Attempt:  1st Attempt  A HIPAA compliant voice message was left requesting a return call.  Instructed patient to call back at [phone]. [place] [phone] [address] , [place] [address] Email : Ashby [email or web address]. [email or web address]

## 2022-12-09 NOTE — Chronic Care Management (AMB) (Signed)
    Chronic Care Management Pharmacy Assistant   Name: Victoria Pena  MRN: [id number] DOB: 04/13/1951  Reason for Encounter: BP readings  12-09-2022: 12-15 136/91 101, 12-16 134/91 91, 12-17 118/78 94, 12-18 97/68 81 (Felt tired and took a nap), 12-19 107/58 97, 12-20 105/73 97, 12-21 94/71 91.   Medications: Outpatient Encounter Medications as of 12/09/2022  Medication Sig   albuterol (VENTOLIN HFA) 108 (90 Base) MCG/ACT inhaler Inhale 2 puffs into the lungs every 6 (six) hours as needed for wheezing or shortness of breath.   ALPRAZolam (XANAX) 0.5 MG tablet TAKE 1 TABLET(0.5 MG) BY MOUTH TWICE DAILY AS NEEDED FOR ANXIETY   amoxicillin-clavulanate (AUGMENTIN) 875-125 MG tablet Take 1 tablet by mouth 2 (two) times daily.   atorvastatin (LIPITOR) 40 MG tablet TAKE 1 TABLET(40 MG) BY MOUTH DAILY   buPROPion (WELLBUTRIN XL) 150 MG 24 hr tablet Take 1 tablet (150 mg total) by mouth daily.   Calcium Carb-Cholecalciferol (CALCIUM 500 + D3) 500-5 MG-MCG TABS Take 3 tablets by mouth daily.   calcium carbonate (TUMS - DOSED IN MG ELEMENTAL CALCIUM) 500 MG chewable tablet Chew 1 tablet by mouth 2 (two) times daily as needed for indigestion or heartburn.   cariprazine (VRAYLAR) 1.5 MG capsule Take 1 capsule (1.5 mg total) by mouth daily for 14 days.   fluticasone (FLONASE) 50 MCG/ACT nasal spray Place 2 sprays into both nostrils daily.   ibuprofen (ADVIL,MOTRIN) 200 MG tablet Take 600 mg by mouth 2 (two) times daily as needed for mild pain.   levothyroxine (SYNTHROID) 125 MCG tablet TAKE 1 TABLET(125 MCG) BY MOUTH DAILY   losartan-hydrochlorothiazide (HYZAAR) 100-12.5 MG tablet Take 1 tablet by mouth daily.   metroNIDAZOLE (METROGEL) 0.75 % gel Apply 1 application topically 2 (two) times daily.   mupirocin ointment (BACTROBAN) 2 % Apply 1 Application topically 2 (two) times daily.   oxyCODONE-acetaminophen (PERCOCET) 10-325 MG tablet Take 1 tablet by mouth every 8 (eight) hours as needed for pain.    pantoprazole (PROTONIX) 40 MG tablet Take 1 tablet (40 mg total) by mouth daily.   pregabalin (LYRICA) 150 MG capsule TAKE 1 CAPSULE(150 MG) BY MOUTH TWICE DAILY   Spacer/Aero-Holding Chambers (AEROCHAMBER MAX W/FLOW-VU) MISC Use as directed.   Teriparatide, Recombinant, (FORTEO) 600 MCG/2.4ML SOPN Inject 20 mcg into the skin daily.   No facility-administered encounter medications on file as of 12/09/2022.    [place] Pharmacist Assistant [phone]

## 2022-12-10 ENCOUNTER — Telehealth: Payer: Self-pay | Admitting: *Deleted

## 2022-12-10 NOTE — Telephone Encounter (Signed)
Ok thank you 

## 2022-12-10 NOTE — Telephone Encounter (Signed)
   Telephone encounter was:  Unsuccessful.  12/10/2022 Name: Victoria Pena MRN: [id number] DOB: 07-19-1951  Unsuccessful outbound call made today to assist with:  Food Insecurity and Home Modifications  Outreach Attempt:  2nd Attempt  A HIPAA compliant voice message was left requesting a return call.  Instructed patient to call back at [phone].  .[place], Population Health [phone] [address] , [place] [address] Email : Ashby [email or web address]. [email or web address]

## 2022-12-16 ENCOUNTER — Telehealth: Payer: Self-pay | Admitting: *Deleted

## 2022-12-16 ENCOUNTER — Ambulatory Visit (INDEPENDENT_AMBULATORY_CARE_PROVIDER_SITE_OTHER): Payer: Medicare PPO | Admitting: Nurse Practitioner

## 2022-12-16 VITALS — BP 120/78 | HR 85 | Temp 97.6°F | Resp 16 | Ht 65.0 in | Wt 178.0 lb

## 2022-12-16 DIAGNOSIS — R051 Acute cough: Secondary | ICD-10-CM

## 2022-12-16 DIAGNOSIS — J101 Influenza due to other identified influenza virus with other respiratory manifestations: Secondary | ICD-10-CM | POA: Insufficient documentation

## 2022-12-16 DIAGNOSIS — J432 Centrilobular emphysema: Secondary | ICD-10-CM

## 2022-12-16 DIAGNOSIS — F418 Other specified anxiety disorders: Secondary | ICD-10-CM | POA: Diagnosis not present

## 2022-12-16 LAB — POC COVID19 BINAXNOW: SARS Coronavirus 2 Ag: NEGATIVE

## 2022-12-16 LAB — POCT INFLUENZA A/B
Influenza A, POC: POSITIVE — AB
Influenza B, POC: NEGATIVE

## 2022-12-16 MED ORDER — SPACER/AERO-HOLDING CHAMBERS DEVI: 1.0000 | Freq: Every day | 0 refills | Status: DC

## 2022-12-16 MED ORDER — OSELTAMIVIR PHOSPHATE 75 MG PO CAPS: 75.0000 mg | ORAL_CAPSULE | Freq: Every day | ORAL | 0 refills | Status: AC

## 2022-12-16 MED ORDER — ESCITALOPRAM OXALATE 10 MG PO TABS: 10.0000 mg | ORAL_TABLET | Freq: Every day | ORAL | 0 refills | Status: DC

## 2022-12-16 NOTE — Progress Notes (Addendum)
Acute Office Visit  Subjective:    Patient ID: Victoria Pena, female    DOB: 05/18/1951, 71 y.o.   MRN: [id number]  CHIEF COMPLAINT; Cough, recent changes in BP medicine  HPI: Patient is here today for cough, headache, sinus drainage, wheezing, fatigue.  Symptoms started yesterday morning. Patient did not try any medicine and she is more miserable today.  Patient is also concerned about her recent changes in blood pressure medicine. She mentioned Losartan 50 mg is working, and her BP at home is running between 120's and 70's and she seems happier that finally this medicine is working for her. She does not take HYDROCHLOROTHIAZIDE anymore.  Patient's taking Wellbutrin for anxiety and depression, not taking Abilify and Vraylar which was prescribed last time in a different time period, patient said Abilify never worked for her and Dietitian she is scared to take it .patient has a next week appointment with [facility] for her anxiety/depression issues. She wants to use something new medicine today.   Past Medical History:  Diagnosis Date   Acquired spondylolisthesis 05/12/2015   Adenocarcinoma of right lung, stage 1 ([place]) 07/26/2016   Anxiety    Arthropathy of lumbar facet joint 04/11/2019   Formatting of this note might be different from the original. Added automatically from request for surgery [id number]   Atherosclerosis of aorta ([place]) 07/08/2021   Benign essential hypertension 12/27/2016   BMI 30.0-30.9,adult 12/23/2020   COPD (chronic obstructive pulmonary disease) ([place])    pt denies   Current mild episode of major depressive disorder ([place]) 03/24/2020   Daytime sleepiness 08/03/2021   Depression    Fibromyalgia 03/24/2020   Gastroesophageal reflux disease without esophagitis 03/24/2020   Graves disease 12/27/2016   Hypertension    Osteoporosis 08/03/2021   Pericardial cyst 02/02/2021   Postoperative hypothyroidism 01/17/2015   Reactive airways dysfunction syndrome, unspecified asthma  severity, uncomplicated ([place]) 2/35/5732   S/P lobectomy of lung 07/26/2015   Formatting of this note might be different from the original. Right upper   S/P lumbar spinal fusion 05/11/2018   Thyroid disease    Toe ulcer, left, limited to breakdown of skin ([place]) 12/23/2020    Past Surgical History:  Procedure Laterality Date   ANKLE SURGERY     bilateral   APPENDECTOMY     BREAST BIOPSY Left 02/25/2020   BREAST BIOPSY Right 08/17/2018   BREAST EXCISIONAL BIOPSY Right    unsure when but marked with scar marker   CHOLECYSTECTOMY     REPLACEMENT TOTAL KNEE Left    THYROIDECTOMY      Family History  Problem Relation Age of Onset   Cancer Mother    Cancer Father    Cancer Brother    Autism Grandson     Social History   Socioeconomic History   Marital status: Widowed    Spouse name: Not on file   Number of children: Not on file   Years of education: Not on file   Highest education level: Not on file  Occupational History   Not on file  Tobacco Use   Smoking status: Former    Packs/day: 1.50    Years: 35.00    Total pack years: 52.50    Types: Cigarettes    Quit date: 07/27/1999    Years since quitting: 23.4   Smokeless tobacco: Never  Vaping Use   Vaping Use: Never used  Substance and Sexual Activity   Alcohol use: No   Drug use: No   Sexual  activity: Not Currently  Other Topics Concern   Not on file  Social History Narrative   Not on file   Social Determinants of Health   Financial Resource Strain: High Risk (11/30/2022)   Overall Financial Resource Strain (CARDIA)    Difficulty of Paying Living Expenses: Hard  Food Insecurity: Food Insecurity Present (12/16/2022)   Hunger Vital Sign    Worried About Running Out of Food in the Last Year: Often true    Ran Out of Food in the Last Year: Often true  Transportation Needs: No Transportation Needs (12/06/2022)   PRAPARE - Hydrologist (Medical): No    Lack of Transportation  (Non-Medical): No  Physical Activity: Insufficiently Active (12/08/2022)   Exercise Vital Sign    Days of Exercise per Week: 1 day    Minutes of Exercise per Session: 10 min  Stress: Stress Concern Present (12/08/2022)   [place]    Feeling of Stress : Rather much  Social Connections: Socially Isolated (04/27/2022)   Social Connection and Isolation Panel [NHANES]    Frequency of Communication with Friends and Family: More than three times a week    Frequency of Social Gatherings with Friends and Family: Twice a week    Attends Religious Services: Never    Marine scientist or Organizations: Not on file    Attends Archivist Meetings: Never    Marital Status: Widowed  Intimate Partner Violence: Not At Risk (04/27/2022)   Humiliation, Afraid, Rape, and Kick questionnaire    Fear of Current or Ex-Partner: No    Emotionally Abused: No    Physically Abused: No    Sexually Abused: No    Outpatient Medications Prior to Visit  Medication Sig Dispense Refill   albuterol (VENTOLIN HFA) 108 (90 Base) MCG/ACT inhaler Inhale 2 puffs into the lungs every 6 (six) hours as needed for wheezing or shortness of breath. 8 g 0   ALPRAZolam (XANAX) 0.5 MG tablet TAKE 1 TABLET(0.5 MG) BY MOUTH TWICE DAILY AS NEEDED FOR ANXIETY 60 tablet 3   amoxicillin-clavulanate (AUGMENTIN) 875-125 MG tablet Take 1 tablet by mouth 2 (two) times daily. 14 tablet 0   atorvastatin (LIPITOR) 40 MG tablet TAKE 1 TABLET(40 MG) BY MOUTH DAILY 90 tablet 2   buPROPion (WELLBUTRIN XL) 150 MG 24 hr tablet Take 1 tablet (150 mg total) by mouth daily. 90 tablet 0   Calcium Carb-Cholecalciferol (CALCIUM 500 + D3) 500-5 MG-MCG TABS Take 3 tablets by mouth daily. 100 tablet 5   calcium carbonate (TUMS - DOSED IN MG ELEMENTAL CALCIUM) 500 MG chewable tablet Chew 1 tablet by mouth 2 (two) times daily as needed for indigestion or heartburn.     fluticasone  (FLONASE) 50 MCG/ACT nasal spray Place 2 sprays into both nostrils daily. 16 g 6   ibuprofen (ADVIL,MOTRIN) 200 MG tablet Take 600 mg by mouth 2 (two) times daily as needed for mild pain.     levothyroxine (SYNTHROID) 125 MCG tablet TAKE 1 TABLET(125 MCG) BY MOUTH DAILY 30 tablet 3   losartan (COZAAR) 50 MG tablet Take 1 tablet (50 mg total) by mouth daily. 90 tablet 0   metroNIDAZOLE (METROGEL) 0.75 % gel Apply 1 application topically 2 (two) times daily. 45 g 3   mupirocin ointment (BACTROBAN) 2 % Apply 1 Application topically 2 (two) times daily. 22 g 0   oxyCODONE-acetaminophen (PERCOCET) 10-325 MG tablet Take 1 tablet by mouth  every 8 (eight) hours as needed for pain. 90 tablet 0   pantoprazole (PROTONIX) 40 MG tablet Take 1 tablet (40 mg total) by mouth daily. 90 tablet 2   pregabalin (LYRICA) 150 MG capsule TAKE 1 CAPSULE(150 MG) BY MOUTH TWICE DAILY 60 capsule 3   Spacer/Aero-Holding Chambers (AEROCHAMBER MAX W/FLOW-VU) MISC Use as directed. 1 each 0   Teriparatide, Recombinant, (FORTEO) 600 MCG/2.4ML SOPN Inject 20 mcg into the skin daily. 2.4 mL 6   cariprazine (VRAYLAR) 1.5 MG capsule Take 1 capsule (1.5 mg total) by mouth daily for 14 days. (Patient not taking: Reported on 12/16/2022) 14 capsule 0   No facility-administered medications prior to visit.    Allergies  Allergen Reactions   Keflex [Cephalexin] Shortness Of Breath    Hard time breathing   Robaxin [Methocarbamol] Nausea Only    Stomach pain . Could not eat   Bactrim [Sulfamethoxazole-Trimethoprim] Itching   Hydrocodone-Acetaminophen Anxiety    Face flushed, high blood pressure, headache, shaking.   Hydroxyzine Other (See Comments) and Swelling    Numbness in lips   Prednisone & Diphenhydramine Other (See Comments)    Face flushed, heart racing   Amlodipine Other (See Comments)    Bp worsened. Shaky. Insomnia.   Ciprofloxacin    Dextromethorphan Polistirex Er    Nitrofurantoin    Pravastatin Other (See  Comments)    joint pains   Diphenhydramine Hcl Palpitations   Moxifloxacin Rash   Prednisone Other (See Comments) and Palpitations    "flushing"     Review of Systems  Constitutional:  Negative for chills and fatigue.  HENT:  Positive for congestion, sinus pressure and sore throat. Negative for ear pain and sinus pain.   Respiratory:  Positive for cough and shortness of breath.   Cardiovascular:  Negative for chest pain and leg swelling.  Gastrointestinal:  Negative for abdominal pain, constipation, diarrhea, nausea and vomiting.  Genitourinary:  Negative for dysuria and frequency.  Musculoskeletal:  Negative for arthralgias, back pain and myalgias.  Neurological:  Positive for headaches. Negative for dizziness.  Psychiatric/Behavioral:  The patient is nervous/anxious.       12/16/2022    2:34 PM 12/08/2022   10:35 AM 12/03/2022   11:11 AM 10/12/2022    1:53 PM 10/12/2022    1:48 PM  Depression screen PHQ 2/9  Decreased Interest _0 Down, Depressed, Hopeless _1 PHQ - 2 Score _2 Altered sleeping _3 0 0  Tired, decreased energy _4 Change in appetite _5 0 0  Feeling bad or failure about yourself  _6 0 0  Trouble concentrating _7 Moving slowly or fidgety/restless _8 Suicidal thoughts 1 0 2 0 0  PHQ-9 Score _9 Difficult doing work/chores Somewhat difficult Very difficult  Not difficult at all Not difficult at all       12/08/2022   10:35 AM 12/03/2022   11:11 AM 10/12/2022    1:53 PM  Depression screen PHQ 2/9  Decreased Interest _10 Down, Depressed, Hopeless _11 PHQ - 2 Score _12 Altered sleeping 2 2 0  Tired, decreased energy _13 Change in appetite 2 2 0  Feeling bad or failure about yourself  1 1 0  Trouble concentrating 2 2  3  Moving slowly or fidgety/restless _0 Suicidal thoughts 0 2 0  PHQ-9 Score _1 Difficult doing work/chores Very difficult  Not difficult at all        12/03/2022   11:42 AM 03/24/2020    2:03 PM  GAD 7 : Generalized Anxiety Score  Nervous, Anxious, on Edge 2 2  Control/stop worrying 2 1  Worry too much - different things 3 1  Trouble relaxing 2 2  Restless 3 2  Easily annoyed or irritable 2 2  Afraid - awful might happen 2 2  Total GAD 7 Score 16 12  Anxiety Difficulty  Very difficult        Objective:    Physical Exam Vitals reviewed.  Constitutional:      General: She is in acute distress.     Appearance: She is normal weight.  HENT:     Right Ear: Tympanic membrane normal.     Left Ear: Tympanic membrane normal.     Nose: Congestion present.     Mouth/Throat:     Mouth: Mucous membranes are moist.  Neck:     Vascular: No carotid bruit.  Cardiovascular:     Rate and Rhythm: Normal rate and regular rhythm.     Heart sounds: Normal heart sounds.  Pulmonary:     Effort: Pulmonary effort is normal.     Breath sounds: Normal breath sounds.  Abdominal:     General: Abdomen is flat. Bowel sounds are normal.     Palpations: Abdomen is soft.     Tenderness: There is no abdominal tenderness.  Musculoskeletal:        General: No swelling or tenderness.     Cervical back: Normal range of motion.  Skin:    General: Skin is warm.  Neurological:     Mental Status: She is alert and oriented to person, place, and time.  Psychiatric:        Mood and Affect: Mood normal.        Behavior: Behavior normal.    BP 120/78   Pulse 85   Temp 97.6 F (36.4 C)   Resp 16   Ht _2  (1.651 m)   Wt 178 lb (80.7 kg)   LMP  (LMP Unknown)   SpO2 98%   BMI 29.62 kg/m  BP 120/78   Pulse 85   Temp 97.6 F (36.4 C)   Resp 16   Ht _3  (1.651 m)   Wt 178 lb (80.7 kg)   LMP  (LMP Unknown)   SpO2 98%   BMI 29.62 kg/m    Wt Readings from Last 3 Encounters:  12/16/22 178 lb (80.7 kg)  12/03/22 179 lb 9.6 oz (81.5 kg)  11/24/22 175 lb (79.4 kg)    Health Maintenance Due  Topic Date Due   Hepatitis C Screening  Never  done   DTaP/Tdap/Td (1 - Tdap) Never done   Zoster Vaccines- Shingrix (1 of 2) Never done   MAMMOGRAM  06/15/2022    There are no preventive care reminders to display for this patient.   Lab Results  Component Value Date   TSH 2.920 08/10/2022   Lab Results  Component Value Date   WBC 4.8 08/10/2022   HGB 13.9 08/10/2022   HCT 42.9 08/10/2022   MCV 90 08/10/2022   PLT 263 08/10/2022   Lab Results  Component Value Date   NA 143 10/12/2022   K 4.6 10/12/2022  CHLORIDE 107 11/22/2017   CO2 25 10/12/2022   GLUCOSE 100 (H) 10/12/2022   BUN 22 10/12/2022   CREATININE 0.70 10/12/2022   BILITOT 0.4 10/12/2022   ALKPHOS 166 (H) 10/12/2022   AST 14 10/12/2022   ALT 14 10/12/2022   PROT 6.7 10/12/2022   ALBUMIN 3.9 10/12/2022   CALCIUM 9.6 10/12/2022   ANIONGAP 9 11/06/2021   EGFR 92 10/12/2022   Lab Results  Component Value Date   CHOL 154 10/12/2022   Lab Results  Component Value Date   HDL 82 10/12/2022   Lab Results  Component Value Date   LDLCALC 59 10/12/2022   Lab Results  Component Value Date   TRIG 67 10/12/2022   Lab Results  Component Value Date   CHOLHDL 1.9 10/12/2022      Assessment & Plan:   Problem List Items Addressed This Visit       Respiratory   COPD (chronic obstructive pulmonary disease) ([place] [address])   Relevant Medications   Spacer/Aero-Holding Chambers DEVI   Influenza A - Primary    Start flu medicine for next 5 days Push through lots of fluids Eat healthy meals 3 times daily Plenty of rest      Relevant Medications   oseltamivir (TAMIFLU) 75 MG capsule     Other   Depression with anxiety    Depression and anxiety not under controlled Started Lexapro 10 mg OD Patient is not using Vraylar sample Follow up in 2 weeks      Relevant Medications   escitalopram (LEXAPRO) 10 MG tablet   Other Visit Diagnoses     Acute cough       Relevant Orders   POC COVID-19 BinaxNow (Completed)   POCT Influenza A/B (Completed)           Follow-up:  in 2 weeks to see the effects of Lexapro.  An After Visit Summary was printed and given to the patient. I, Neil Crouch have reviewed all documentation for this visit. The documentation on 12/16/22   for the exam, diagnosis, procedures, and orders are all accurate and complete.        Neil Crouch, DNP, [place] [phone]

## 2022-12-16 NOTE — Assessment & Plan Note (Signed)
Depression and anxiety not under controlled Started Lexapro 10 mg OD Patient is not using Vraylar sample Follow up in 2 weeks

## 2022-12-16 NOTE — Telephone Encounter (Signed)
   Telephone encounter was:  Successful.  12/16/2022 Name: [name] MRN: [id number] DOB: 11-10-1951  [name] is a 71 y.o. year old female who is a primary care patient of Neil Crouch, FNP . The community resource team was consulted for assistance with Food Insecurity and utility  Will apply for Medicaid extra help . [employer] in Jan 2024 and will mail food banks lists.  Care guide performed the following interventions: Patient provided with information about care guide support team and interviewed to confirm resource needs.  Follow Up Plan:  No further follow up planned at this time. The patient has been provided with needed resources. [place] [phone] [address] , [place] [address] Email : Ashby [email or web address]. [email or web address]

## 2022-12-16 NOTE — Assessment & Plan Note (Signed)
Start flu medicine for next 5 days Push through lots of fluids Eat healthy meals 3 times daily Bern of rest

## 2022-12-16 NOTE — Patient Instructions (Signed)
Take antibiotics as prescribed Use Flonase nasal spray daily Take OTC cough medicine    H1N1 Influenza H1N1 influenza is a respiratory illness caused by the H1N1 virus. A respiratory illness affects parts of the body that are involved in breathing, including the nose, throat, and lungs. H1N1 flu is a variety of influenza A. It was first called swine flu because it was like a flu virus that commonly affects pigs. H1N1 flu symptoms may be slightly more severe than symptoms that are caused by other types of seasonal flu. The first outbreak of H1N1 flu in people occurred in 2009. After the virus spread from pigs to people, it eventually changed into the type of virus that can pass from person to person, like other flu viruses. You cannot get the virus by eating pork or pork products. What are the causes? H1N1 flu is caused by the H1N1 virus. You can catch the virus by: Breathing in droplets from an infected person's cough or sneeze. Touching something that has been exposed to the virus and then touching your mouth, nose, or eyes. What increases the risk? You may be at higher risk for H1N1 flu if you: Are age 38 or older. Are younger than age 78. Have a weak disease-fighting system (immune system). Are pregnant. Have a long-term (chronic) disease. Live in a nursing home or hospital. Work in healthcare. What are the signs or symptoms? Signs and symptoms may start 3-5 days after infection. They may include: Fever. Sore throat. Cough. Muscle aches. Chills. Tiredness (fatigue). Loss of appetite. Runny or stuffy nose. Headache. Nausea. Vomiting or diarrhea. These symptoms are less common. How is this diagnosed? This condition may be diagnosed based on your symptoms, your medical history, and a physical exam. In some cases, a swab of fluid from your nose or throat may be tested for the H1N1 virus. How is this treated? H1N1 flu usually goes away in 4-8 days without treatment. Taking care of  yourself at home may be all that you need to do during this time. Your health care provider may recommend taking over-the-counter medicines, drinking plenty of fluids, and adding humidity to the air in your home. If you are at risk for severe flu complications, you may be treated with antiviral medicine. If this medicine is taken soon enough, it can shorten the illness and make it less severe. Follow these instructions at home: Medicines Take over-the-counter and prescription medicines only as told by your health care provider. If you were prescribed antiviral medicine, take it as told by your health care provider. Do not stop taking it even if you start to feel better. Do not give aspirin to a child with the flu, because of the association with Reye syndrome. Activity Rest at home while you recover. Prevent spreading H1N1 flu to others. Take the following steps until your fever has been gone for 24 hours without the use of medicine: Avoid leaving home. Stay home from work or school. Stay away from pregnant women, the elderly, young children, and people with chronic medical conditions. General instructions     Use a cool-mist humidifier to add humidity to the air in your home. This can make breathing easier. Cover your mouth and nose when you cough or sneeze. Do not prepare food for others while you are infected. Wash your hands often with soap and water for at least 20 seconds, especially after you cough or sneeze. If soap and water are not available, use hand sanitizer. Make sure that all  people in your household wash their hands well and often. Drink enough fluid to keep your urine pale yellow. Keep all follow-up visits. This is important. How is this prevented? Getting a yearly (annual) flu shot is the best way to prevent the flu. You may get the flu shot in late summer, fall, or winter. Ask your health care provider when you should get your flu shot. Wash your hands with soap and water  often, especially after you cough or sneeze. If soap and water are not available, use hand sanitizer. Avoid contact with people who are sick during cold and flu season. Wear a mask to protect yourself when you are around people who are sick or might be sick. Eat a healthy diet, drink plenty of fluids, get enough sleep, and exercise regularly. Do not touch your face if you have not cleaned your hands. Contact a health care provider if: You develop new symptoms. You have severe symptoms that do not get better with home care. You have flu symptoms: That last longer than one week. That come back. You lack energy (are lethargic). Get help right away if: You have trouble breathing. You have chest pain. You cough up blood or yellow or gray fluid. You feel dizzy or you faint. You feel confused. These symptoms may represent a serious problem that is an emergency. Do not wait to see if the symptoms will go away. Get medical help right away. Call your local emergency services (911 in the [place] Do not drive yourself to the hospital. Summary H1N1 influenza is a respiratory illness caused by the H1N1 virus. Symptoms may be slightly more severe than symptoms that are caused by other types of seasonal flu. H1N1 flu usually goes away in 4-8 days without treatment. Taking care of yourself at home may be all that you need to do during this time. Stay home from work or school until your fever has been gone for 24 hours without the use of medicine. Getting a yearly (annual) flu shot and practicing good hand hygiene may help to prevent the flu. This information is not intended to replace advice given to you by your health care provider. Make sure you discuss any questions you have with your health care provider. Document Revised: 06/27/2020 Document Reviewed: 06/27/2020 Elsevier Patient Education  [place].      Escitalopram Tablets What is this medication? ESCITALOPRAM (es sye TAL oh pram)  treats depression and anxiety. It increases the amount of serotonin in the brain, a hormone that helps regulate mood. It belongs to a group of medications called SSRIs. This medicine may be used for other purposes; ask your health care provider or pharmacist if you have questions. COMMON BRAND NAME(S): Lexapro What should I tell my care team before I take this medication? They need to know if you have any of these conditions: Bipolar disorder or a family history of bipolar disorder Diabetes Glaucoma Heart disease Kidney or liver disease Receiving electroconvulsive therapy Seizures Suicidal thoughts, plans, or attempt by you or a family member An unusual or allergic reaction to escitalopram, the related medication citalopram, other medications, foods, dyes, or preservatives Pregnant or trying to become pregnant Breast-feeding How should I use this medication? Take this medication by mouth with a glass of water. Follow the directions on the prescription label. You can take it with or without food. If it upsets your stomach, take it with food. Take your medication at regular intervals. Do not take it more often than directed.  Do not stop taking this medication suddenly except upon the advice of your care team. Stopping this medication too quickly may cause serious side effects or your condition may worsen. A special MedGuide will be given to you by the pharmacist with each prescription and refill. Be sure to read this information carefully each time. Talk to your care team regarding the use of this medication in children. Special care may be needed. Overdosage: If you think you have taken too much of this medicine contact a poison control center or emergency room at once. NOTE: This medicine is only for you. Do not share this medicine with others. What if I miss a dose? If you miss a dose, take it as soon as you can. If it is almost time for your next dose, take only that dose. Do not take double  or extra doses. What may interact with this medication? Do not take this medication with any of the following: Certain medications for fungal infections like fluconazole, itraconazole, ketoconazole, posaconazole, voriconazole Cisapride Citalopram Dronedarone Linezolid MAOIs like Carbex, Eldepryl, Marplan, Nardil, and Parnate Methylene blue (injected into a vein) Pimozide Thioridazine This medication may also interact with the following: Alcohol Amphetamines Aspirin and aspirin-like medications Carbamazepine Certain medications for depression, anxiety, or psychotic disturbances Certain medications for migraine headache like almotriptan, eletriptan, frovatriptan, naratriptan, rizatriptan, sumatriptan, zolmitriptan Certain medications for sleep Certain medications that treat or prevent blood clots like warfarin, enoxaparin, dalteparin Cimetidine Diuretics Dofetilide Fentanyl Furazolidone Isoniazid Lithium Metoprolol NSAIDs, medications for pain and inflammation, like ibuprofen or naproxen Other medications that prolong the QT interval (cause an abnormal heart rhythm) Procarbazine Rasagiline Supplements like St. John's wort, kava kava, valerian Tramadol Tryptophan Ziprasidone This list may not describe all possible interactions. Give your health care provider a list of all the medicines, herbs, non-prescription drugs, or dietary supplements you use. Also tell them if you smoke, drink alcohol, or use illegal drugs. Some items may interact with your medicine. What should I watch for while using this medication? Tell your care team if your symptoms do not get better or if they get worse. Visit your care team for regular checks on your progress. Because it may take several weeks to see the full effects of this medication, it is important to continue your treatment as prescribed by your care team. Watch for new or worsening thoughts of suicide or depression. This includes sudden  changes in mood, behaviors, or thoughts. These changes can happen at any time but are more common in the beginning of treatment or after a change in dose. Call your care team right away if you experience these thoughts or worsening depression. Manic episodes may happen in patients with bipolar disorder who take this medication. Watch for changes in feelings or behaviors such as feeling anxious, nervous, agitated, panicky, irritable, hostile, aggressive, impulsive, severely restless, overly excited and hyperactive, or trouble sleeping. These symptoms can happen at any time but are more common in the beginning of treatment or after a change in dose. Call your care team right away if you notice any of these symptoms. You may get drowsy or dizzy. Do not drive, use machinery, or do anything that needs mental alertness until you know how this medication affects you. Do not stand or sit up quickly, especially if you are an older patient. This reduces the risk of dizzy or fainting spells. Alcohol may interfere with the effect of this medication. Avoid alcoholic drinks. Your mouth may get dry. Chewing sugarless gum or sucking  hard candy, and drinking plenty of water may help. Contact your care team if the problem does not go away or is severe. What side effects may I notice from receiving this medication? Side effects that you should report to your care team as soon as possible: Allergic reactions--skin rash, itching, hives, swelling of the face, lips, tongue, or throat Bleeding--bloody or black, tar-like stools, red or dark brown urine, vomiting blood or brown material that looks like coffee grounds, small, red or purple spots on skin, unusual bleeding or bruising Heart rhythm changes--fast or irregular heartbeat, dizziness, feeling faint or lightheaded, chest pain, trouble breathing Low sodium level--muscle weakness, fatigue, dizziness, headache, confusion Serotonin syndrome--irritability, confusion, fast or  irregular heartbeat, muscle stiffness, twitching muscles, sweating, high fever, seizure, chills, vomiting, diarrhea Sudden eye pain or change in vision such as blurry vision, seeing halos around lights, vision loss Thoughts of suicide or self-harm, worsening mood, feelings of depression Side effects that usually do not require medical attention (report to your care team if they continue or are bothersome): Change in sex drive or performance Diarrhea Excessive sweating Nausea Tremors or shaking Upset stomach This list may not describe all possible side effects. Call your doctor for medical advice about side effects. You may report side effects to FDA at 1-800-FDA-1088. Where should I keep my medication? Keep out of reach of children and pets. Store at room temperature between 15 and 30 degrees C (59 and 86 degrees F). Throw away any unused medication after the expiration date. NOTE: This sheet is a summary. It may not cover all possible information. If you have questions about this medicine, talk to your doctor, pharmacist, or health care provider.  2023 Elsevier/Gold Standard (2008-01-27 00:00:00)

## 2022-12-23 ENCOUNTER — Other Ambulatory Visit: Payer: Self-pay | Admitting: Family Medicine

## 2022-12-23 ENCOUNTER — Other Ambulatory Visit: Payer: Self-pay | Admitting: Nurse Practitioner

## 2022-12-23 ENCOUNTER — Telehealth (INDEPENDENT_AMBULATORY_CARE_PROVIDER_SITE_OTHER): Payer: Medicare HMO | Admitting: Family Medicine

## 2022-12-23 VITALS — BP 127/70 | Temp 98.6°F | Wt 178.0 lb

## 2022-12-23 DIAGNOSIS — J439 Emphysema, unspecified: Secondary | ICD-10-CM

## 2022-12-23 DIAGNOSIS — R300 Dysuria: Secondary | ICD-10-CM

## 2022-12-23 MED ORDER — PHENAZOPYRIDINE HCL 200 MG PO TABS: 200.0000 mg | ORAL_TABLET | Freq: Three times a day (TID) | ORAL | 0 refills | Status: DC | PRN

## 2022-12-23 NOTE — Progress Notes (Signed)
Virtual Visit via Telephone Note   This visit type was conducted per patient preference.  Patient verbally consented to a telehealth visit.   Date:  12/27/2022   ID:  Victoria Pena, DOB [birth year], MRN [id number]  Patient Location: Home Provider Location: Office/Clinic  PCP:  Neil Crouch, FNP   Evaluation Performed:  acute  Chief Complaint:  dysuria  History of Present Illness:    Victoria Pena is a 72 y.o. female with urgency, dysuria, abdominal pain, and nausea for couple of days. Has frequent UTIs. Treated with augmentin in early December. Seems to help, but then comes back. Allergic to bactrim, but would like to try it again. Willing to come for Urine tomorrow.   Past Medical History:  Diagnosis Date   Acquired spondylolisthesis 05/12/2015   Adenocarcinoma of right lung, stage 1 ([place]) 07/26/2016   Anxiety    Arthropathy of lumbar facet joint 04/11/2019   Formatting of this note might be different from the original. Added automatically from request for surgery [id number]   Atherosclerosis of aorta ([place]) 07/08/2021   Benign essential hypertension 12/27/2016   BMI 30.0-30.9,adult 12/23/2020   COPD (chronic obstructive pulmonary disease) ([place])    pt denies   Current mild episode of major depressive disorder ([place]) 03/24/2020   Daytime sleepiness 08/03/2021   Depression    Fibromyalgia 03/24/2020   Gastroesophageal reflux disease without esophagitis 03/24/2020   Graves disease 12/27/2016   Hypertension    Osteoporosis 08/03/2021   Pericardial cyst 02/02/2021   Postoperative hypothyroidism 01/17/2015   Reactive airways dysfunction syndrome, unspecified asthma severity, uncomplicated ([place]) 0/07/6760   S/P lobectomy of lung 07/26/2015   Formatting of this note might be different from the original. Right upper   S/P lumbar spinal fusion 05/11/2018   Thyroid disease    Toe ulcer, left, limited to breakdown of skin ([place]) 12/23/2020    Past Surgical History:  Procedure Laterality Date   ANKLE  SURGERY     bilateral   APPENDECTOMY     BREAST BIOPSY Left 02/25/2020   BREAST BIOPSY Right 08/17/2018   BREAST EXCISIONAL BIOPSY Right    unsure when but marked with scar marker   CHOLECYSTECTOMY     REPLACEMENT TOTAL KNEE Left    THYROIDECTOMY      Family History  Problem Relation Age of Onset   Cancer Mother    Cancer Father    Cancer Brother    Autism Grandson     Social History   Socioeconomic History   Marital status: Widowed    Spouse name: Not on file   Number of children: Not on file   Years of education: Not on file   Highest education level: Not on file  Occupational History   Not on file  Tobacco Use   Smoking status: Former    Packs/day: 1.50    Years: 35.00    Total pack years: 52.50    Types: Cigarettes    Quit date: 07/27/1999    Years since quitting: 23.4   Smokeless tobacco: Never  Vaping Use   Vaping Use: Never used  Substance and Sexual Activity   Alcohol use: No   Drug use: No   Sexual activity: Not Currently  Other Topics Concern   Not on file  Social History Narrative   Not on file   Social Determinants of Health   Financial Resource Strain: High Risk (11/30/2022)   Overall Financial Resource Strain (CARDIA)    Difficulty of Paying Living Expenses:  Hard  Food Insecurity: Food Insecurity Present (12/16/2022)   Hunger Vital Sign    Worried About Running Out of Food in the Last Year: Often true    Ran Out of Food in the Last Year: Often true  Transportation Needs: No Transportation Needs (12/06/2022)   PRAPARE - Hydrologist (Medical): No    Lack of Transportation (Non-Medical): No  Physical Activity: Insufficiently Active (12/08/2022)   Exercise Vital Sign    Days of Exercise per Week: 1 day    Minutes of Exercise per Session: 10 min  Stress: Stress Concern Present (12/08/2022)   [place]    Feeling of Stress : Rather much  Social  Connections: Socially Isolated (04/27/2022)   Social Connection and Isolation Panel [NHANES]    Frequency of Communication with Friends and Family: More than three times a week    Frequency of Social Gatherings with Friends and Family: Twice a week    Attends Religious Services: Never    Marine scientist or Organizations: Not on file    Attends Archivist Meetings: Never    Marital Status: Widowed  Intimate Partner Violence: Not At Risk (04/27/2022)   Humiliation, Afraid, Rape, and Kick questionnaire    Fear of Current or Ex-Partner: No    Emotionally Abused: No    Physically Abused: No    Sexually Abused: No    Outpatient Medications Prior to Visit  Medication Sig Dispense Refill   albuterol (VENTOLIN HFA) 108 (90 Base) MCG/ACT inhaler Inhale 2 puffs into the lungs every 6 (six) hours as needed for wheezing or shortness of breath. 8 g 0   ALPRAZolam (XANAX) 0.5 MG tablet TAKE 1 TABLET(0.5 MG) BY MOUTH TWICE DAILY AS NEEDED FOR ANXIETY 60 tablet 3   atorvastatin (LIPITOR) 40 MG tablet TAKE 1 TABLET(40 MG) BY MOUTH DAILY 90 tablet 2   buPROPion (WELLBUTRIN XL) 150 MG 24 hr tablet Take 1 tablet (150 mg total) by mouth daily. 90 tablet 0   Calcium Carb-Cholecalciferol (CALCIUM 500 + D3) 500-5 MG-MCG TABS Take 3 tablets by mouth daily. 100 tablet 5   calcium carbonate (TUMS - DOSED IN MG ELEMENTAL CALCIUM) 500 MG chewable tablet Chew 1 tablet by mouth 2 (two) times daily as needed for indigestion or heartburn.     escitalopram (LEXAPRO) 10 MG tablet Take 1 tablet (10 mg total) by mouth daily. 30 tablet 0   fluticasone (FLONASE) 50 MCG/ACT nasal spray Place 2 sprays into both nostrils daily. 16 g 6   ibuprofen (ADVIL,MOTRIN) 200 MG tablet Take 600 mg by mouth 2 (two) times daily as needed for mild pain.     levothyroxine (SYNTHROID) 125 MCG tablet TAKE 1 TABLET(125 MCG) BY MOUTH DAILY 30 tablet 3   losartan (COZAAR) 50 MG tablet Take 1 tablet (50 mg total) by mouth daily. 90 tablet  0   metroNIDAZOLE (METROGEL) 0.75 % gel Apply 1 application topically 2 (two) times daily. 45 g 3   mupirocin ointment (BACTROBAN) 2 % Apply 1 Application topically 2 (two) times daily. 22 g 0   pantoprazole (PROTONIX) 40 MG tablet Take 1 tablet (40 mg total) by mouth daily. 90 tablet 2   pregabalin (LYRICA) 150 MG capsule TAKE 1 CAPSULE(150 MG) BY MOUTH TWICE DAILY 60 capsule 3   Spacer/Aero-Holding Chambers DEVI 1 each by Does not apply route daily. 1 each 0   Teriparatide, Recombinant, (FORTEO) 600 MCG/2.4ML SOPN Inject  20 mcg into the skin daily. 2.4 mL 6   amoxicillin-clavulanate (AUGMENTIN) 875-125 MG tablet Take 1 tablet by mouth 2 (two) times daily. 14 tablet 0   oxyCODONE-acetaminophen (PERCOCET) 10-325 MG tablet Take 1 tablet by mouth every 8 (eight) hours as needed for pain. 90 tablet 0   No facility-administered medications prior to visit.    Allergies:   Keflex [cephalexin], Robaxin [methocarbamol], Bactrim [sulfamethoxazole-trimethoprim], Hydrocodone-acetaminophen, Hydroxyzine, Prednisone & diphenhydramine, Amlodipine, Ciprofloxacin, Dextromethorphan polistirex er, Nitrofurantoin, Pravastatin, Diphenhydramine hcl, Moxifloxacin, and Prednisone   Social History   Tobacco Use   Smoking status: Former    Packs/day: 1.50    Years: 35.00    Total pack years: 52.50    Types: Cigarettes    Quit date: 07/27/1999    Years since quitting: 23.4   Smokeless tobacco: Never  Vaping Use   Vaping Use: Never used  Substance Use Topics   Alcohol use: No   Drug use: No     Review of Systems  Constitutional:  Negative for chills, diaphoresis and fever.  Gastrointestinal:  Positive for abdominal pain and nausea. Negative for vomiting.  Genitourinary:  Positive for dysuria, frequency and urgency.     Labs/Other Tests and Data Reviewed:    Recent Labs: 08/10/2022: Hemoglobin 13.9; Platelets 263; TSH 2.920 10/12/2022: ALT 14; BUN 22; Creatinine, Ser 0.70; Potassium 4.6; Sodium 143    Recent Lipid Panel Lab Results  Component Value Date/Time   CHOL 154 10/12/2022 02:33 PM   TRIG 67 10/12/2022 02:33 PM   HDL 82 10/12/2022 02:33 PM   CHOLHDL 1.9 10/12/2022 02:33 PM   LDLCALC 59 10/12/2022 02:33 PM    Wt Readings from Last 3 Encounters:  12/27/22 177 lb (80.3 kg)  12/23/22 178 lb (80.7 kg)  12/16/22 178 lb (80.7 kg)     Objective:    Vital Signs:  BP 127/70   Temp 98.6 F (37 C)   Wt 178 lb (80.7 kg)   LMP  (LMP Unknown)   BMI 29.62 kg/m    Physical Exam   ASSESSMENT & PLAN:   1. Dysuria - POCT URINALYSIS DIP (CLINITEK) - Urine Culture    Orders Placed This Encounter  Procedures   Urine Culture   POCT URINALYSIS DIP (CLINITEK)     Meds ordered this encounter  Medications   DISCONTD: phenazopyridine (PYRIDIUM) 200 MG tablet    Sig: Take 1 tablet (200 mg total) by mouth 3 (three) times daily as needed for pain.    Dispense:  12 tablet    Refill:  0    COVID-19 Education: The signs and symptoms of COVID-19 were discussed with the patient and how to seek care for testing (follow up with PCP or arrange E-visit). The importance of social distancing was discussed today.   I spent 10 minutes dedicated to the care of this patient on the date of this encounter.  Follow Up:  In Person in 1 week(s)  Signed,  Rochel Brome, MD  12/27/2022 11:09 PM    [place]

## 2022-12-24 DIAGNOSIS — R300 Dysuria: Secondary | ICD-10-CM | POA: Diagnosis not present

## 2022-12-24 LAB — POCT URINALYSIS DIP (CLINITEK)
Bilirubin, UA: NEGATIVE
Blood, UA: NEGATIVE
Glucose, UA: NEGATIVE mg/dL
Ketones, POC UA: NEGATIVE mg/dL
Nitrite, UA: POSITIVE — AB
Spec Grav, UA: 1.015 (ref 1.010–1.025)
Urobilinogen, UA: 0.2 E.U./dL
pH, UA: 6 (ref 5.0–8.0)

## 2022-12-27 ENCOUNTER — Other Ambulatory Visit: Payer: Self-pay

## 2022-12-27 ENCOUNTER — Encounter: Payer: Self-pay | Admitting: Nurse Practitioner

## 2022-12-27 ENCOUNTER — Ambulatory Visit (INDEPENDENT_AMBULATORY_CARE_PROVIDER_SITE_OTHER): Payer: Medicare HMO | Admitting: Nurse Practitioner

## 2022-12-27 ENCOUNTER — Encounter: Payer: Self-pay | Admitting: Family Medicine

## 2022-12-27 VITALS — BP 128/80 | HR 85 | Temp 97.1°F | Ht 65.0 in | Wt 177.0 lb

## 2022-12-27 DIAGNOSIS — Z87892 Personal history of anaphylaxis: Secondary | ICD-10-CM | POA: Diagnosis not present

## 2022-12-27 DIAGNOSIS — N3001 Acute cystitis with hematuria: Secondary | ICD-10-CM

## 2022-12-27 DIAGNOSIS — M4310 Spondylolisthesis, site unspecified: Secondary | ICD-10-CM

## 2022-12-27 DIAGNOSIS — R300 Dysuria: Secondary | ICD-10-CM | POA: Insufficient documentation

## 2022-12-27 LAB — POCT URINALYSIS DIP (CLINITEK)
Glucose, UA: NEGATIVE mg/dL
Ketones, POC UA: NEGATIVE mg/dL
Nitrite, UA: POSITIVE — AB
POC PROTEIN,UA: 30 — AB
Spec Grav, UA: 1.015 (ref 1.010–1.025)
Urobilinogen, UA: 0.2 E.U./dL
pH, UA: 5.5 (ref 5.0–8.0)

## 2022-12-27 MED ORDER — EPINEPHRINE 0.3 MG/0.3ML IJ SOAJ: 0.3000 mg | INTRAMUSCULAR | 1 refills | Status: AC | PRN

## 2022-12-27 MED ORDER — OXYCODONE-ACETAMINOPHEN 10-325 MG PO TABS: 1.0000 | ORAL_TABLET | Freq: Three times a day (TID) | ORAL | 0 refills | Status: DC | PRN

## 2022-12-27 MED ORDER — SULFAMETHOXAZOLE-TRIMETHOPRIM 800-160 MG PO TABS: 1.0000 | ORAL_TABLET | Freq: Two times a day (BID) | ORAL | 0 refills | Status: DC

## 2022-12-27 NOTE — Progress Notes (Signed)
Acute Office Visit  Subjective:    Patient ID: Victoria Pena, female    DOB: August 24, 1951, 72 y.o.   MRN: [id number]  Chief Complaint  Patient presents with   URI    HPI: Patient is in today for Upper respiratory symptoms She complains of congestion, nasal congestion, productive cough with  yellow colored sputum, and wheezing. Reports generalized weakness and body aches. Denies fever, chills, night sweats or weight loss. Onset of symptoms was  1.5 weeks ago and staying constant.She is drinking plenty of fluids.  Past history is significant for occasional episodes of bronchitis. Patient is former smoker, Patient was dx with flu 12/16/22. Has been taking otc "mucus relief and suppressant which have not helped. Tessilon Perles for cough are not helping.  Took course of  Augmentin for UTI on 12/02/22. States symptoms subsided temporarily and dysuria has returned. No results on urine culture at this time. Pt was seen via telemedicine visit on 12/23/22 for dysuria. Prescribed pyridium but pt did not take medication due to expense. She has multiple allergies to antibiotics.  She is scheduled to undergo a laminectomy on 01/07/23 with Dr Sherley Bounds.    Past Medical History:  Diagnosis Date   Acquired spondylolisthesis 05/12/2015   Adenocarcinoma of right lung, stage 1 ([place]) 07/26/2016   Anxiety    Arthropathy of lumbar facet joint 04/11/2019   Formatting of this note might be different from the original. Added automatically from request for surgery [id number]   Atherosclerosis of aorta ([place]) 07/08/2021   Benign essential hypertension 12/27/2016   BMI 30.0-30.9,adult 12/23/2020   COPD (chronic obstructive pulmonary disease) ([place])    pt denies   Current mild episode of major depressive disorder ([place]) 03/24/2020   Daytime sleepiness 08/03/2021   Depression    Fibromyalgia 03/24/2020   Gastroesophageal reflux disease without esophagitis 03/24/2020   Graves disease 12/27/2016   Hypertension    Osteoporosis  08/03/2021   Pericardial cyst 02/02/2021   Postoperative hypothyroidism 01/17/2015   Reactive airways dysfunction syndrome, unspecified asthma severity, uncomplicated ([place]) 01/13/5808   S/P lobectomy of lung 07/26/2015   Formatting of this note might be different from the original. Right upper   S/P lumbar spinal fusion 05/11/2018   Thyroid disease    Toe ulcer, left, limited to breakdown of skin ([place]) 12/23/2020    Past Surgical History:  Procedure Laterality Date   ANKLE SURGERY     bilateral   APPENDECTOMY     BREAST BIOPSY Left 02/25/2020   BREAST BIOPSY Right 08/17/2018   BREAST EXCISIONAL BIOPSY Right    unsure when but marked with scar marker   CHOLECYSTECTOMY     REPLACEMENT TOTAL KNEE Left    THYROIDECTOMY      Family History  Problem Relation Age of Onset   Cancer Mother    Cancer Father    Cancer Brother    Autism Grandson     Social History   Socioeconomic History   Marital status: Widowed    Spouse name: Not on file   Number of children: Not on file   Years of education: Not on file   Highest education level: Not on file  Occupational History   Not on file  Tobacco Use   Smoking status: Former    Packs/day: 1.50    Years: 35.00    Total pack years: 52.50    Types: Cigarettes    Quit date: 07/27/1999    Years since quitting: 23.4   Smokeless tobacco: Never  Vaping Use   Vaping Use: Never used  Substance and Sexual Activity   Alcohol use: No   Drug use: No   Sexual activity: Not Currently  Other Topics Concern   Not on file  Social History Narrative   Not on file   Social Determinants of Health   Financial Resource Strain: High Risk (11/30/2022)   Overall Financial Resource Strain (CARDIA)    Difficulty of Paying Living Expenses: Hard  Food Insecurity: Food Insecurity Present (12/16/2022)   Hunger Vital Sign    Worried About Running Out of Food in the Last Year: Often true    Ran Out of Food in the Last Year: Often true  Transportation Needs: No  Transportation Needs (12/06/2022)   PRAPARE - Hydrologist (Medical): No    Lack of Transportation (Non-Medical): No  Physical Activity: Insufficiently Active (12/08/2022)   Exercise Vital Sign    Days of Exercise per Week: 1 day    Minutes of Exercise per Session: 10 min  Stress: Stress Concern Present (12/08/2022)   [place]    Feeling of Stress : Rather much  Social Connections: Socially Isolated (04/27/2022)   Social Connection and Isolation Panel [NHANES]    Frequency of Communication with Friends and Family: More than three times a week    Frequency of Social Gatherings with Friends and Family: Twice a week    Attends Religious Services: Never    Marine scientist or Organizations: Not on file    Attends Archivist Meetings: Never    Marital Status: Widowed  Intimate Partner Violence: Not At Risk (04/27/2022)   Humiliation, Afraid, Rape, and Kick questionnaire    Fear of Current or Ex-Partner: No    Emotionally Abused: No    Physically Abused: No    Sexually Abused: No    Outpatient Medications Prior to Visit  Medication Sig Dispense Refill   albuterol (VENTOLIN HFA) 108 (90 Base) MCG/ACT inhaler Inhale 2 puffs into the lungs every 6 (six) hours as needed for wheezing or shortness of breath. 8 g 0   ALPRAZolam (XANAX) 0.5 MG tablet TAKE 1 TABLET(0.5 MG) BY MOUTH TWICE DAILY AS NEEDED FOR ANXIETY 60 tablet 3   atorvastatin (LIPITOR) 40 MG tablet TAKE 1 TABLET(40 MG) BY MOUTH DAILY 90 tablet 2   buPROPion (WELLBUTRIN XL) 150 MG 24 hr tablet Take 1 tablet (150 mg total) by mouth daily. 90 tablet 0   Calcium Carb-Cholecalciferol (CALCIUM 500 + D3) 500-5 MG-MCG TABS Take 3 tablets by mouth daily. 100 tablet 5   calcium carbonate (TUMS - DOSED IN MG ELEMENTAL CALCIUM) 500 MG chewable tablet Chew 1 tablet by mouth 2 (two) times daily as needed for indigestion or heartburn.      escitalopram (LEXAPRO) 10 MG tablet Take 1 tablet (10 mg total) by mouth daily. 30 tablet 0   fluticasone (FLONASE) 50 MCG/ACT nasal spray Place 2 sprays into both nostrils daily. 16 g 6   ibuprofen (ADVIL,MOTRIN) 200 MG tablet Take 600 mg by mouth 2 (two) times daily as needed for mild pain.     levothyroxine (SYNTHROID) 125 MCG tablet TAKE 1 TABLET(125 MCG) BY MOUTH DAILY 30 tablet 3   losartan (COZAAR) 50 MG tablet Take 1 tablet (50 mg total) by mouth daily. 90 tablet 0   metroNIDAZOLE (METROGEL) 0.75 % gel Apply 1 application topically 2 (two) times daily. 45 g 3   mupirocin ointment (  BACTROBAN) 2 % Apply 1 Application topically 2 (two) times daily. 22 g 0   oxyCODONE-acetaminophen (PERCOCET) 10-325 MG tablet Take 1 tablet by mouth every 8 (eight) hours as needed for pain. 90 tablet 0   pantoprazole (PROTONIX) 40 MG tablet Take 1 tablet (40 mg total) by mouth daily. 90 tablet 2   pregabalin (LYRICA) 150 MG capsule TAKE 1 CAPSULE(150 MG) BY MOUTH TWICE DAILY 60 capsule 3   Spacer/Aero-Holding Chambers DEVI 1 each by Does not apply route daily. 1 each 0   Teriparatide, Recombinant, (FORTEO) 600 MCG/2.4ML SOPN Inject 20 mcg into the skin daily. 2.4 mL 6   amoxicillin-clavulanate (AUGMENTIN) 875-125 MG tablet Take 1 tablet by mouth 2 (two) times daily. 14 tablet 0   phenazopyridine (PYRIDIUM) 200 MG tablet Take 1 tablet (200 mg total) by mouth 3 (three) times daily as needed for pain. 12 tablet 0   No facility-administered medications prior to visit.    Allergies  Allergen Reactions   Keflex [Cephalexin] Shortness Of Breath    Hard time breathing   Robaxin [Methocarbamol] Nausea Only    Stomach pain . Could not eat   Bactrim [Sulfamethoxazole-Trimethoprim] Itching   Hydrocodone-Acetaminophen Anxiety    Face flushed, high blood pressure, headache, shaking.   Hydroxyzine Other (See Comments) and Swelling    Numbness in lips   Prednisone & Diphenhydramine Other (See Comments)    Face  flushed, heart racing   Amlodipine Other (See Comments)    Bp worsened. Shaky. Insomnia.   Ciprofloxacin    Dextromethorphan Polistirex Er    Nitrofurantoin    Pravastatin Other (See Comments)    joint pains   Diphenhydramine Hcl Palpitations   Moxifloxacin Rash   Prednisone Other (See Comments) and Palpitations    "flushing"     Review of Systems See pertinent positives and negatives per HPI.     Objective:    Physical Exam Vitals reviewed.  Constitutional:      Appearance: She is ill-appearing.  HENT:     Right Ear: Tympanic membrane normal.     Left Ear: Tympanic membrane normal.     Nose: Congestion present.     Mouth/Throat:     Pharynx: Posterior oropharyngeal erythema present.  Eyes:     Pupils: Pupils are equal, round, and reactive to light.     Comments: Eyeglasses in place  Cardiovascular:     Rate and Rhythm: Normal rate and regular rhythm.     Pulses: Normal pulses.     Heart sounds: Normal heart sounds.  Abdominal:     General: Bowel sounds are normal.     Palpations: Abdomen is soft.     Tenderness: There is no abdominal tenderness. There is no right CVA tenderness or left CVA tenderness.  Musculoskeletal:     Cervical back: Neck supple.  Lymphadenopathy:     Cervical: No cervical adenopathy.  Skin:    General: Skin is warm.     Capillary Refill: Capillary refill takes less than 2 seconds.  Neurological:     General: No focal deficit present.     Mental Status: She is alert.     BP 128/80   Pulse 85   Temp (!) 97.1 F (36.2 C)   Ht 5\' 5"  (1.651 m)   Wt 177 lb (80.3 kg)   LMP  (LMP Unknown)   SpO2 98%   BMI 29.45 kg/m   Wt Readings from Last 3 Encounters:  12/27/22 177 lb (80.3 kg)  12/23/22 178  lb (80.7 kg)  12/16/22 178 lb (80.7 kg)    Health Maintenance Due  Topic Date Due   Hepatitis C Screening  Never done   DTaP/Tdap/Td (1 - Tdap) Never done   Zoster Vaccines- Shingrix (1 of 2) Never done   MAMMOGRAM  06/15/2022    COVID-19 Vaccine (4 - 2023-24 season) 12/21/2022       Lab Results  Component Value Date   TSH 2.920 08/10/2022   Lab Results  Component Value Date   WBC 4.8 08/10/2022   HGB 13.9 08/10/2022   HCT 42.9 08/10/2022   MCV 90 08/10/2022   PLT 263 08/10/2022   Lab Results  Component Value Date   NA 143 10/12/2022   K 4.6 10/12/2022   CHLORIDE 107 11/22/2017   CO2 25 10/12/2022   GLUCOSE 100 (H) 10/12/2022   BUN 22 10/12/2022   CREATININE 0.70 10/12/2022   BILITOT 0.4 10/12/2022   ALKPHOS 166 (H) 10/12/2022   AST 14 10/12/2022   ALT 14 10/12/2022   PROT 6.7 10/12/2022   ALBUMIN 3.9 10/12/2022   CALCIUM 9.6 10/12/2022   ANIONGAP 9 11/06/2021   EGFR 92 10/12/2022   Lab Results  Component Value Date   CHOL 154 10/12/2022   Lab Results  Component Value Date   HDL 82 10/12/2022   Lab Results  Component Value Date   LDLCALC 59 10/12/2022   Lab Results  Component Value Date   TRIG 67 10/12/2022   Lab Results  Component Value Date   CHOLHDL 1.9 10/12/2022        Assessment & Plan:    1. Acute cystitis with hematuria - POCT URINALYSIS DIP (CLINITEK) - Urine Culture - sulfamethoxazole-trimethoprim (BACTRIM DS) 800-160 MG tablet; Take 1 tablet by mouth 2 (two) times daily.  Dispense: 14 tablet; Refill: 0  2. History of anaphylaxis - EPINEPHrine 0.3 mg/0.3 mL IJ SOAJ injection; Inject 0.3 mg into the muscle as needed for anaphylaxis.  Dispense: 1 each; Refill: 1     Rest and push fluids Seek emergency medical care for any severe worsening of symptoms Take Benadryl as needed for itching Follow-up as needed   Follow-up: PRN  An After Visit Summary was printed and given to the patient.  I, Rip Harbour, NP, have reviewed all documentation for this visit. The documentation on 12/27/22 for the exam, diagnosis, procedures, and orders are all accurate and complete.    Signed, Rip Harbour, NP [place] [phone]

## 2022-12-27 NOTE — Assessment & Plan Note (Signed)
Treat presumptively with bactrim ds.  Check UA and urine culture.

## 2022-12-27 NOTE — Patient Instructions (Addendum)
Rest and push fluids Seek emergency medical care for any severe worsening of symptoms Take Benadryl as needed for itching Follow-up as needed   Urinary Tract Infection, Adult A urinary tract infection (UTI) is an infection of any part of the urinary tract. The urinary tract includes: The kidneys. The ureters. The bladder. The urethra. These organs make, store, and get rid of pee (urine) in the body. What are the causes? This infection is caused by germs (bacteria) in your genital area. These germs grow and cause swelling (inflammation) of your urinary tract. What increases the risk? The following factors may make you more likely to develop this condition: Using a small, thin tube (catheter) to drain pee. Not being able to control when you pee or poop (incontinence). Being female. If you are female, these things can increase the risk: Using these methods to prevent pregnancy: A medicine that kills sperm (spermicide). A device that blocks sperm (diaphragm). Having low levels of a female hormone (estrogen). Being pregnant. You are more likely to develop this condition if: You have genes that add to your risk. You are sexually active. You take antibiotic medicines. You have trouble peeing because of: A prostate that is bigger than normal, if you are female. A blockage in the part of your body that drains pee from the bladder. A kidney stone. A nerve condition that affects your bladder. Not getting enough to drink. Not peeing often enough. You have other conditions, such as: Diabetes. A weak disease-fighting system (immune system). Sickle cell disease. Gout. Injury of the spine. What are the signs or symptoms? Symptoms of this condition include: Needing to pee right away. Peeing small amounts often. Pain or burning when peeing. Blood in the pee. Pee that smells bad or not like normal. Trouble peeing. Pee that is cloudy. Fluid coming from the vagina, if you are female. Pain  in the belly or lower back. Other symptoms include: Vomiting. Not feeling hungry. Feeling mixed up (confused). This may be the first symptom in older adults. Being tired and grouchy (irritable). A fever. Watery poop (diarrhea). How is this treated? Taking antibiotic medicine. Taking other medicines. Drinking enough water. In some cases, you may need to see a specialist. Follow these instructions at home:  Medicines Take over-the-counter and prescription medicines only as told by your doctor. If you were prescribed an antibiotic medicine, take it as told by your doctor. Do not stop taking it even if you start to feel better. General instructions Make sure you: Pee until your bladder is empty. Do not hold pee for a long time. Empty your bladder after sex. Wipe from front to back after peeing or pooping if you are a female. Use each tissue one time when you wipe. Drink enough fluid to keep your pee pale yellow. Keep all follow-up visits. Contact a doctor if: You do not get better after 1-2 days. Your symptoms go away and then come back. Get help right away if: You have very bad back pain. You have very bad pain in your lower belly. You have a fever. You have chills. You feeling like you will vomit or you vomit. Summary A urinary tract infection (UTI) is an infection of any part of the urinary tract. This condition is caused by germs in your genital area. There are many risk factors for a UTI. Treatment includes antibiotic medicines. Drink enough fluid to keep your pee pale yellow. This information is not intended to replace advice given to you by your health  care provider. Make sure you discuss any questions you have with your health care provider. Document Revised: 07/18/2020 Document Reviewed: 07/18/2020 Elsevier Patient Education  [place].

## 2022-12-28 ENCOUNTER — Telehealth: Payer: Self-pay

## 2022-12-28 NOTE — Progress Notes (Signed)
Care Management & Coordination Services Pharmacy Team  Reason for Encounter: General adherence update   Contacted patient on 12/28/2022 for general disease state and medication adherence call.   Recent office visits:  12-27-2022 Janie Morning, NP. Abnormal UA. START Epinephrine 0.3 mg as needed and bactrim 800-160 mg twice daily. COMPLETED augmentin and pyridium.  12-23-2022 Blane Ohara, MD. Visit for dysuria. Abnormal UA. START pyridium 200 mg 3 times daily PRN.  12-16-2022 Lurline Del, FNP. Positive flu test. START Lexapro 10 mg daily and tamiflu 75 mg for 5 days.  12-08-2022 Isaiah Blakes, LCSW (CCM).  12-06-2022 Bevelyn Ngo (CCM).  12-03-2022 Lurline Del, FNP. START albuterol 2 puffs every 6 hours PRN, Vraylar 1.5 mg daily, losartan/HCTZ 100-12.5 mg daily. STOP abilify, losartan and doxycycline.  Recent consult visits:  12-02-2022 [facility] NP [facility] (Neurosurgery). Visit for CT results.  Hospital visits:  None in previous 6 months  Medications: Outpatient Encounter Medications as of 12/28/2022  Medication Sig   albuterol (VENTOLIN HFA) 108 (90 Base) MCG/ACT inhaler Inhale 2 puffs into the lungs every 6 (six) hours as needed for wheezing or shortness of breath.   ALPRAZolam (XANAX) 0.5 MG tablet TAKE 1 TABLET(0.5 MG) BY MOUTH TWICE DAILY AS NEEDED FOR ANXIETY   atorvastatin (LIPITOR) 40 MG tablet TAKE 1 TABLET(40 MG) BY MOUTH DAILY   buPROPion (WELLBUTRIN XL) 150 MG 24 hr tablet Take 1 tablet (150 mg total) by mouth daily.   Calcium Carb-Cholecalciferol (CALCIUM 500 + D3) 500-5 MG-MCG TABS Take 3 tablets by mouth daily.   calcium carbonate (TUMS - DOSED IN MG ELEMENTAL CALCIUM) 500 MG chewable tablet Chew 1 tablet by mouth 2 (two) times daily as needed for indigestion or heartburn.   EPINEPHrine 0.3 mg/0.3 mL IJ SOAJ injection Inject 0.3 mg into the muscle as needed for anaphylaxis.   escitalopram (LEXAPRO) 10 MG tablet Take 1 tablet (10 mg total) by mouth daily.    fluticasone (FLONASE) 50 MCG/ACT nasal spray Place 2 sprays into both nostrils daily.   ibuprofen (ADVIL,MOTRIN) 200 MG tablet Take 600 mg by mouth 2 (two) times daily as needed for mild pain.   levothyroxine (SYNTHROID) 125 MCG tablet TAKE 1 TABLET(125 MCG) BY MOUTH DAILY   losartan (COZAAR) 50 MG tablet Take 1 tablet (50 mg total) by mouth daily.   metroNIDAZOLE (METROGEL) 0.75 % gel Apply 1 application topically 2 (two) times daily.   mupirocin ointment (BACTROBAN) 2 % Apply 1 Application topically 2 (two) times daily.   oxyCODONE-acetaminophen (PERCOCET) 10-325 MG tablet Take 1 tablet by mouth every 8 (eight) hours as needed for pain.   pantoprazole (PROTONIX) 40 MG tablet Take 1 tablet (40 mg total) by mouth daily.   pregabalin (LYRICA) 150 MG capsule TAKE 1 CAPSULE(150 MG) BY MOUTH TWICE DAILY   Spacer/Aero-Holding Chambers DEVI 1 each by Does not apply route daily.   sulfamethoxazole-trimethoprim (BACTRIM DS) 800-160 MG tablet Take 1 tablet by mouth 2 (two) times daily.   Teriparatide, Recombinant, (FORTEO) 600 MCG/2.4ML SOPN Inject 20 mcg into the skin daily.   No facility-administered encounter medications on file as of 12/28/2022.    Recent vitals BP Readings from Last 3 Encounters:  12/27/22 128/80  12/23/22 127/70  12/16/22 120/78   Pulse Readings from Last 3 Encounters:  12/27/22 85  12/16/22 85  12/03/22 76   Wt Readings from Last 3 Encounters:  12/27/22 177 lb (80.3 kg)  12/23/22 178 lb (80.7 kg)  12/16/22 178 lb (80.7 kg)   BMI Readings  from Last 3 Encounters:  12/27/22 29.45 kg/m  12/23/22 29.62 kg/m  12/16/22 29.62 kg/m    Recent lab results    Component Value Date/Time   NA 143 10/12/2022 1433   NA 140 11/22/2017 1451   K 4.6 10/12/2022 1433   K 3.9 11/22/2017 1451   CL 105 10/12/2022 1433   CO2 25 10/12/2022 1433   CO2 26 11/22/2017 1451   GLUCOSE 100 (H) 10/12/2022 1433   GLUCOSE 96 11/06/2021 1921   GLUCOSE 91 11/22/2017 1451   BUN 22  10/12/2022 1433   BUN 14.5 11/22/2017 1451   CREATININE 0.70 10/12/2022 1433   CREATININE 0.74 01/23/2021 1453   CREATININE 0.8 11/22/2017 1451   CALCIUM 9.6 10/12/2022 1433   CALCIUM 9.1 11/22/2017 1451    Lab Results  Component Value Date   CREATININE 0.70 10/12/2022   EGFR 92 10/12/2022   GFRNONAA >60 11/06/2021   GFRAA 106 12/23/2020   No results found for: "HGBA1C", "FRUCTOSAMINE", "MICROALBUR"  Lab Results  Component Value Date   CHOL 154 10/12/2022   HDL 82 10/12/2022   LDLCALC 59 10/12/2022   TRIG 67 10/12/2022   CHOLHDL 1.9 10/12/2022     What concerns do you have about your medications? Patient stated no  The patient denies side effects with their medications.   How often do you forget or accidentally miss a dose? Never  Do you use a pillbox? Yes  Are you having any problems getting your medications from your pharmacy? No  Has the cost of your medications been a concern? No If yes, what medication and is patient assistance available or has it been applied for?  Since last visit with PharmD, no interventions have been made.   The patient has not had an ED visit since last contact.   The patient reports the following problems with their health. Patient stated just normal issues with fibromyalgia.   Patient denies concerns or questions for Artelia Laroche, PharmD at this time.   Counseled patient on: Great job taking medications   Care Gaps: Annual wellness visit in last year? Yes  If Diabetic: Last eye exam / retinopathy screening: Last year Last diabetic foot exam: None Last UACR: None  Star Rating Drugs:  Losartan 50 mg- Last filled 12 -21-2023 90 DS. Losartan 100 mg previous 08-22-2022 90 DS   Victoria Pena

## 2022-12-29 ENCOUNTER — Ambulatory Visit: Payer: Self-pay | Admitting: *Deleted

## 2022-12-29 ENCOUNTER — Ambulatory Visit
Admission: RE | Admit: 2022-12-29 | Discharge: 2022-12-29 | Disposition: A | Discharge status: Home / Self Care | Payer: Medicare HMO | Source: Ambulatory Visit | Attending: Legal Medicine | Admitting: Legal Medicine

## 2022-12-29 DIAGNOSIS — Z1231 Encounter for screening mammogram for malignant neoplasm of breast: Secondary | ICD-10-CM | POA: Diagnosis not present

## 2022-12-29 NOTE — Patient Instructions (Signed)
Visit Information  Thank you for taking time to visit with me today. Please don't hesitate to contact me if I can be of assistance to you.   Following are the goals we discussed today:   Goals Addressed               This Visit's Progress     Patient Stated she feels overwhelmed sometimes and has depresion issues. She has challenges with meeting daily care needs (pt-stated)        Interventions: This CSW did follow up call to pt today. Pt reports planning for back surgery next week. Pt also shared that she was started on Lexapro for depression a few weeks ago and she is feeling better in regards to depression. Pt completed depression screening with CSW and scored "3" (mild) which was an improvement from last month's "13".   Pt shared she has chronic pain "but I keep going". Pt exercising at home when CSW called. She is struggling with finances since stopping her job in November feeling it was unsafe health wise and physically (she was an Engineer, production for [employer]). Discussed with pt possible part time work she could find that would be doable and encouraged her to look into options as well as to reach out to [place] for opportunities.   Pt reports she was approved for partial Medicaid- that will cover her premium   Previous notes indicate Daneen Schick, BSW,  provided client information on [employer] for putting client  name on wait list for in home aide support . Client plans to call [employer] today to add her name to wait list for in home aide support Client has support from her daughter.    Pt appreciative and agreeable to follow up call 01/21/23          Our next appointment is by telephone on 01/21/23   Please call the care guide team at [phone] if you need to cancel or reschedule your appointment.   If you are experiencing a Mental Health or [place] or need someone to talk to, please call the Suicide and Crisis Lifeline: 988 call the [place]  National Suicide Prevention Lifeline: 571-483-1367 or TTY: 276-580-5253 TTY 715-026-9530) to talk to a trained counselor call 911   Patient verbalizes understanding of instructions and care plan provided today and agrees to view in [place]. Active MyChart status and patient understanding of how to access instructions and care plan via MyChart confirmed with patient.     Telephone follow up appointment with care management team member scheduled for:01/21/23  Eduard Clos MSW, LCSW Licensed Clinical Social Worker    [phone]

## 2022-12-29 NOTE — Patient Outreach (Signed)
  Care Coordination   Follow Up Visit Note   12/29/2022 Name: Victoria Pena MRN: [id number] DOB: 11/14/1951  Victoria Pena is a 72 y.o. year old female who sees Neil Crouch, FNP for primary care. I spoke with  Victoria Pena by phone today.  What matters to the patients health and wellness today?  Having back surgery next week!    Goals Addressed               This Visit's Progress     Patient Stated she feels overwhelmed sometimes and has depresion issues. She has challenges with meeting daily care needs (pt-stated)        Interventions: This CSW did follow up call to pt today. Pt reports planning for back surgery next week. Pt also shared that she was started on Lexapro for depression a few weeks ago and she is feeling better in regards to depression. Pt completed depression screening with CSW and scored "3" (mild) which was an improvement from last month's "13".   Pt shared she has chronic pain "but I keep going". Pt exercising at home when CSW called. She is struggling with finances since stopping her job in November feeling it was unsafe health wise and physically (she was an Engineer, production for [employer]). Discussed with pt possible part time work she could find that would be doable and encouraged her to look into options as well as to reach out to [place] for opportunities.   Pt reports she was approved for partial Medicaid- that will cover her premium   Previous notes indicate Victoria Pena, BSW,  provided client information on [employer] for putting client  name on wait list for in home aide support . Client plans to call [employer] today to add her name to wait list for in home aide support Client has support from her daughter.    Pt appreciative and agreeable to follow up call 01/21/23          SDOH assessments and interventions completed:  Yes  SDOH Interventions Today    Flowsheet Row Most Recent Value  SDOH Interventions   Depression  Interventions/Treatment  Counseling, Medication, Currently on Treatment, PHQ2-9 Score <4 Follow-up Not Indicated  Financial Strain Interventions Other (Comment)  [Community Resource Care Guide/BSW outreach to pt]  Stress Interventions Provide Counseling, Other (Comment)  [stress related to chronic pain, finances,etc]        Care Coordination Interventions:  Yes, provided   Follow up plan: Follow up call scheduled for 01/21/23    Encounter Outcome:  Pt. Visit Completed

## 2022-12-30 ENCOUNTER — Ambulatory Visit: Payer: Medicare HMO | Admitting: Nurse Practitioner

## 2022-12-30 ENCOUNTER — Telehealth: Payer: Self-pay

## 2022-12-30 VITALS — BP 120/70 | HR 79 | Temp 97.2°F | Resp 16 | Ht 66.0 in | Wt 182.0 lb

## 2022-12-30 DIAGNOSIS — F418 Other specified anxiety disorders: Secondary | ICD-10-CM

## 2022-12-30 DIAGNOSIS — N3001 Acute cystitis with hematuria: Secondary | ICD-10-CM | POA: Diagnosis not present

## 2022-12-30 LAB — POCT URINALYSIS DIP (CLINITEK)
Bilirubin, UA: NEGATIVE
Blood, UA: NEGATIVE
Glucose, UA: NEGATIVE mg/dL
Ketones, POC UA: NEGATIVE mg/dL
Nitrite, UA: NEGATIVE
Spec Grav, UA: >=1.03 — AB (ref 1.010–1.025)
Urobilinogen, UA: 0.2 E.U./dL
pH, UA: 6 (ref 5.0–8.0)

## 2022-12-30 NOTE — Assessment & Plan Note (Signed)
  Increase fluid intake to a minimum of 8, 8 ounce glasses of water per day to help flush the kidneys and bladder.  The medication Pyridium may help with symptoms of bladder spasms, abdominal pain, and burning. This medication will turn your urine bright orange. This is expected and nothing to be concerned about. I have prescribed this for you in addition to the antibiotic.   If you begin to have worsening symptoms, low back pain, fevers, chills, nausea, vomiting, diarrhea, or decreased urine, please let us know.  Please finish all of your antibiotic even if you begin to feel better before you have completed the full course. It takes the full dose to be completely effective against the bacteria present. Stopping the antibiotic early can lead to the bacteria becoming resistant to the antibiotic and puts you at risk of the antibiotic not working for you in the future.

## 2022-12-30 NOTE — Progress Notes (Cosign Needed)
12-30-2022: Contacted [facility] to follow up on Teriparatide PAP and was told aplication is missing page 4. Sent task to Pattricia Boss to refax page 4.  [place] Pharmacist Assistant [phone]

## 2022-12-30 NOTE — Pre-Procedure Instructions (Signed)
Surgical Instructions    Your procedure is scheduled on Friday, January 07, 2023 at 2:46 PM.  Report to [facility]tr[facility] "A" at 12:45 PM., then check in with the Admitting office.  Call this number if you have problems the morning of surgery:  [phone]   If you have any questions prior to your surgery date call [phone]: Open Monday-Friday 8am-4pm  *If you experience any cold or flu symptoms such as cough, fever, chills, shortness of breath, etc. between now and your scheduled surgery, please notify us.*    Remember:  Do not eat after midnight the night before your surgery  You may drink clear liquids until 11:45 AM the morning of your surgery.   Clear liquids allowed are: Water, Non-Citrus Juices (without pulp), Carbonated Beverages, Clear Tea, Black Coffee Only (NO MILK, CREAM OR POWDERED CREAMER of any kind), and Gatorade.    Take these medicines the morning of surgery with A SIP OF WATER:  atorvastatin (LIPITOR)  buPROPion (WELLBUTRIN XL)  escitalopram (LEXAPRO)  levothyroxine (SYNTHROID)  pantoprazole (PROTONIX)  pregabalin (LYRICA)  sulfamethoxazole-trimethoprim (BACTRIM DS)   IF NEEDED: albuterol (VENTOLIN HFA)  ALPRAZolam (XANAX)  EPINEPHrine  oxyCODONE-acetaminophen (PERCOCET)   Please bring all inhalers with you the day of surgery.   As of today, STOP taking any Aspirin (unless otherwise instructed by your surgeon) Aleve, Naproxen, Ibuprofen, Motrin, Advil, Goody's, BC's, all herbal medications, fish oil, and all vitamins.                     Do NOT Smoke (Tobacco/Vaping) for 24 hours prior to your procedure.  If you use a CPAP at night, you may bring your mask/headgear for your overnight stay.   Contacts, glasses, piercing's, hearing aid's, dentures or partials may not be worn into surgery, please bring cases for these belongings.    For patients admitted to the hospital, discharge time will be determined by your treatment team.   Patients  discharged the day of surgery will not be allowed to drive home, and someone needs to stay with them for 24 hours.  SURGICAL WAITING ROOM VISITATION Patients having surgery or a procedure may have two support people in the waiting area. Visitors may stay in the waiting area during the procedure and switch out with other visitors if needed. Only 1 support person is allowed in the pre-op area with the patient AFTER the patient is prepped. This person cannot be switched out. Children under the age of 30 must have an adult accompany them who is not the patient. If the patient needs to stay at the hospital during part of their recovery, the visitor guidelines for inpatient rooms apply.  Please refer to the [facility] website for the visitor guidelines for Inpatients (after your surgery is over and you are in a regular room).    Special instructions:   [place]- Preparing For Surgery  Before surgery, you can play an important role. Because skin is not sterile, your skin needs to be as free of germs as possible. You can reduce the number of germs on your skin by washing with CHG (chlorahexidine gluconate) Soap before surgery.  CHG is an antiseptic cleaner which kills germs and bonds with the skin to continue killing germs even after washing.    Oral Hygiene is also important to reduce your risk of infection.  Remember - BRUSH YOUR TEETH THE MORNING OF SURGERY WITH YOUR REGULAR TOOTHPASTE  Please do not use if you have an  allergy to CHG or antibacterial soaps. If your skin becomes reddened/irritated stop using the CHG.  Do not shave (including legs and underarms) for at least 48 hours prior to first CHG shower. It is OK to shave your face.  Please follow these instructions carefully.   Shower the NIGHT BEFORE SURGERY and the MORNING OF SURGERY  If you chose to wash your hair, wash your hair first as usual with your normal shampoo.  After you shampoo, rinse your hair and body thoroughly to  remove the shampoo.  Use CHG Soap as you would any other liquid soap. You can apply CHG directly to the skin and wash gently with a scrungie or a clean washcloth.   Apply the CHG Soap to your body ONLY FROM THE NECK DOWN.  Do not use on open wounds or open sores. Avoid contact with your eyes, ears, mouth and genitals (private parts). Wash Face and genitals (private parts)  with your normal soap.   Wash thoroughly, paying special attention to the area where your surgery will be performed.  Thoroughly rinse your body with warm water from the neck down.  DO NOT shower/wash with your normal soap after using and rinsing off the CHG Soap.  Pat yourself dry with a CLEAN TOWEL.  Wear CLEAN PAJAMAS to bed the night before surgery  Place CLEAN SHEETS on your bed the night before your surgery  DO NOT SLEEP WITH PETS.   Day of Surgery: Take a shower with CHG soap. Do not wear jewelry or makeup Do not wear lotions, powders, perfumes/colognes, or deodorant. Do not shave 48 hours prior to surgery. Do not wear nail polish, gel polish, artificial nails, or any other type of covering on natural nails (fingers and toes) If you have artificial nails or gel coating that need to be removed by a nail salon, please have this removed prior to surgery. Artificial nails or gel coating may interfere with anesthesia's ability to adequately monitor your vital signs. Wear Clean/Comfortable clothing the morning of surgery Do not bring valuables to the hospital.  [facility] is not responsible for any belongings or valuables. Remember to brush your teeth WITH YOUR REGULAR TOOTHPASTE.   Please read over the following fact sheets that you were given.  If you received a COVID test during your pre-op visit  it is requested that you wear a mask when out in public, stay away from anyone that may not be feeling well and notify your surgeon if you develop symptoms. If you have been in contact with anyone that has tested  positive in the last 10 days please notify you surgeon.

## 2022-12-30 NOTE — Assessment & Plan Note (Signed)
Patient is taking Lexapro 10 mg

## 2022-12-30 NOTE — Patient Instructions (Signed)
Follow up as needed  Asymptomatic Bacteriuria Asymptomatic bacteriuria is the presence of a large number of bacteria in the urine without the usual symptoms of burning or frequent urination. This is usually not harmful, and treatment may not be needed. A person with this condition will not be more likely to develop an infection in the future. What are the causes? This condition is caused by an increase in bacteria in the urine. This increase can be caused by: Bacteria entering the urinary tract, such as during sex. A blockage in the urinary tract, such as from kidney stones or a tumor. Bladder problems that prevent the bladder from emptying. What increases the risk? You are more likely to develop this condition if: You have diabetes. You are an older adult. This especially affects older adults in long-term care facilities. You are pregnant and in the first trimester. You have kidney stones. You are female. You have had a kidney transplant. You have a leaky kidney tube valve (reflux). You had a urinary catheter for a long period of time. This is a long, thin tube that collects urine. What are the signs or symptoms? There are no symptoms of this condition. How is this diagnosed? This condition is diagnosed with a urine test. Because this condition does not cause symptoms, it is usually diagnosed when a urine sample is taken to treat or diagnose another condition, such as pregnancy or kidney problems. Most women who are in their first trimester of pregnancy are screened for asymptomatic bacteriuria. How is this treated? Usually, treatment is not needed for this condition. Treating the condition can lead to other problems, such as a yeast infection or the growth of bacteria that do not respond to treatment (antibiotic-resistant bacteria). Some people do need treatment with antibiotic medicines to prevent kidney infection, known as pyelonephritis. Treatment is needed if: You are pregnant. In  pregnant women, kidney infection can lead to: Early labor (premature labor). Very low birth weight (fetal growth restriction). Newborn death. You are having a procedure that affects the urinary tract. You have had a kidney transplant. If you are diagnosed with this condition, talk with your health care provider about any concerns that you have. Follow these instructions at home: Medicines Take over-the-counter and prescription medicines only as told by your health care provider. If you were prescribed an antibiotic medicine, take it as told by your health care provider. Do not stop using the antibiotic even if you start to feel better. General instructions Monitor your condition for any changes. Drink enough fluid to keep your urine pale yellow. Urinate more often to keep your bladder empty. If you are female, keep the area around your vagina and rectum clean. Wipe from front to back after urinating or having a bowel movement. Use each piece of toilet paper only once. Keep all follow-up visits. This is important. Contact a health care provider if: You have symptoms of a urine infection, such as: A burning sensation, or pain when you urinate. A strong need to urinate, or urinating more often. Urine turning discolored or cloudy. Blood in your urine. Urine that smells bad. Get help right away if: You develop signs of a kidney infection, such as: Back pain or pelvic pain. A fever or chills. Nausea or vomiting. Severe pain that cannot be controlled with medicine. Summary Asymptomatic bacteriuria is the presence of a large number of bacteria in the urine without the usual symptoms of burning or frequent urination. Usually, treatment is not needed for this condition.  Treating the condition can lead to other problems, such as a yeast infection or the growth of bacteria that do not respond to treatment. Some people do need treatment. Treatment is needed if you are pregnant, if you are having  a procedure that affects the urinary tract, or if you have had a kidney transplant. If you were prescribed an antibiotic medicine, take it as told by your health care provider. Do not stop using the antibiotic even if you start to feel better. This information is not intended to replace advice given to you by your health care provider. Make sure you discuss any questions you have with your health care provider. Document Revised: 07/18/2020 Document Reviewed: 07/18/2020 Else[employer] Patient Education  [employer].

## 2022-12-30 NOTE — Progress Notes (Signed)
Subjective:  Patient ID: Victoria Pena, female    DOB: [birth year]  Age: 72 y.o. MRN: [id number]  Chief Complaints:  Some tiredness and not sure it is due to Bactrim or Lexapro.  History of Present illness:  Patient was given bactrim for 7 days for UTI 12/27/2022, Patient said she has 4 days to go to finish her Bactrim. Patient said she is feeling tired and sluggish but she is not sure which medicine causing this.Besides this she is feeling better. She does not have any symptoms of dysuria, incomplete emptying  of urine and suprapubic tenderness. She wants to recheck her urine if everything is okay. She also wants to know the results of the urine culture. Her depression is getting better. She does not feel depressed or sad anymore and she wants to continue Lexapro 10 mg for now.   Current Outpatient Medications on File Prior to Visit  Medication Sig Dispense Refill   albuterol (VENTOLIN HFA) 108 (90 Base) MCG/ACT inhaler Inhale 2 puffs into the lungs every 6 (six) hours as needed for wheezing or shortness of breath. 8 g 0   ALPRAZolam (XANAX) 0.5 MG tablet TAKE 1 TABLET(0.5 MG) BY MOUTH TWICE DAILY AS NEEDED FOR ANXIETY 60 tablet 3   atorvastatin (LIPITOR) 40 MG tablet TAKE 1 TABLET(40 MG) BY MOUTH DAILY 90 tablet 2   buPROPion (WELLBUTRIN XL) 150 MG 24 hr tablet Take 1 tablet (150 mg total) by mouth daily. 90 tablet 0   Calcium Carb-Cholecalciferol (CALCIUM 500 + D3) 500-5 MG-MCG TABS Take 3 tablets by mouth daily. (Patient taking differently: Take 1 tablet by mouth in the morning and at bedtime.) 100 tablet 5   calcium carbonate (TUMS - DOSED IN MG ELEMENTAL CALCIUM) 500 MG chewable tablet Chew 1 tablet by mouth 2 (two) times daily as needed for indigestion or heartburn.     EPINEPHrine 0.3 mg/0.3 mL IJ SOAJ injection Inject 0.3 mg into the muscle as needed for anaphylaxis. 1 each 1   escitalopram (LEXAPRO) 10 MG tablet Take 1 tablet (10 mg total) by mouth daily. 30 tablet 0   fluticasone  (FLONASE) 50 MCG/ACT nasal spray Place 2 sprays into both nostrils daily. (Patient taking differently: Place 2 sprays into both nostrils at bedtime.) 16 g 6   levothyroxine (SYNTHROID) 125 MCG tablet TAKE 1 TABLET(125 MCG) BY MOUTH DAILY 30 tablet 3   losartan (COZAAR) 50 MG tablet Take 1 tablet (50 mg total) by mouth daily. 90 tablet 0   metroNIDAZOLE (METROGEL) 0.75 % gel Apply 1 application topically 2 (two) times daily. (Patient taking differently: Apply 1 application  topically 2 (two) times daily as needed (rosacea).) 45 g 3   Multiple Vitamin (MULTIVITAMIN WITH MINERALS) TABS tablet Take 1 tablet by mouth in the morning and at bedtime.     mupirocin ointment (BACTROBAN) 2 % Apply 1 Application topically 2 (two) times daily. 22 g 0   oxyCODONE-acetaminophen (PERCOCET) 10-325 MG tablet Take 1 tablet by mouth every 8 (eight) hours as needed for pain. 90 tablet 0   pantoprazole (PROTONIX) 40 MG tablet Take 1 tablet (40 mg total) by mouth daily. 90 tablet 2   pregabalin (LYRICA) 150 MG capsule TAKE 1 CAPSULE(150 MG) BY MOUTH TWICE DAILY 60 capsule 3   Spacer/Aero-Holding Chambers DEVI 1 each by Does not apply route daily. 1 each 0   sulfamethoxazole-trimethoprim (BACTRIM DS) 800-160 MG tablet Take 1 tablet by mouth 2 (two) times daily. 14 tablet 0   Teriparatide, Recombinant, (FORTEO)  600 MCG/2.4ML SOPN Inject 20 mcg into the skin daily. (Patient taking differently: Inject 20 mcg into the skin every 6 (six) months.) 2.4 mL 6   trolamine salicylate (ASPERCREME) 10 % cream Apply 1 Application topically as needed for muscle pain.     No current facility-administered medications on file prior to visit.   Past Medical History:  Diagnosis Date   Acquired spondylolisthesis 05/12/2015   Adenocarcinoma of right lung, stage 1 ([place]) 07/26/2016   Anxiety    Arthropathy of lumbar facet joint 04/11/2019   Formatting of this note might be different from the original. Added automatically from request for surgery  [id number]   Atherosclerosis of aorta ([place]) 07/08/2021   Benign essential hypertension 12/27/2016   BMI 30.0-30.9,adult 12/23/2020   COPD (chronic obstructive pulmonary disease) ([place])    pt denies   Current mild episode of major depressive disorder ([place]) 03/24/2020   Daytime sleepiness 08/03/2021   Depression    Fibromyalgia 03/24/2020   Gastroesophageal reflux disease without esophagitis 03/24/2020   Graves disease 12/27/2016   Hypertension    Osteoporosis 08/03/2021   Pericardial cyst 02/02/2021   Postoperative hypothyroidism 01/17/2015   Reactive airways dysfunction syndrome, unspecified asthma severity, uncomplicated ([place]) 6/57/8469   S/P lobectomy of lung 07/26/2015   Formatting of this note might be different from the original. Right upper   S/P lumbar spinal fusion 05/11/2018   Thyroid disease    Toe ulcer, left, limited to breakdown of skin ([place]) 12/23/2020   Past Surgical History:  Procedure Laterality Date   ANKLE SURGERY     bilateral   APPENDECTOMY     BREAST BIOPSY Left 02/25/2020   BREAST BIOPSY Right 08/17/2018   BREAST EXCISIONAL BIOPSY Right    unsure when but marked with scar marker   CHOLECYSTECTOMY     REPLACEMENT TOTAL KNEE Left    THYROIDECTOMY      Family History  Problem Relation Age of Onset   Cancer Mother    Cancer Father    Cancer Brother    Autism Grandson    Breast cancer Neg Hx    Social History   Socioeconomic History   Marital status: Widowed    Spouse name: Not on file   Number of children: Not on file   Years of education: Not on file   Highest education level: Not on file  Occupational History   Not on file  Tobacco Use   Smoking status: Former    Packs/day: 1.50    Years: 35.00    Total pack years: 52.50    Types: Cigarettes    Quit date: 07/27/1999    Years since quitting: 23.4   Smokeless tobacco: Never  Vaping Use   Vaping Use: Never used  Substance and Sexual Activity   Alcohol use: No   Drug use: No   Sexual activity: Not Currently   Other Topics Concern   Not on file  Social History Narrative   Not on file   Social Determinants of Health   Financial Resource Strain: High Risk (12/29/2022)   Overall Financial Resource Strain (CARDIA)    Difficulty of Paying Living Expenses: Hard  Food Insecurity: Food Insecurity Present (12/16/2022)   Hunger Vital Sign    Worried About Running Out of Food in the Last Year: Often true    Ran Out of Food in the Last Year: Often true  Transportation Needs: No Transportation Needs (12/06/2022)   PRAPARE - Hydrologist (Medical): No  Lack of Transportation (Non-Medical): No  Physical Activity: Insufficiently Active (12/08/2022)   Exercise Vital Sign    Days of Exercise per Week: 1 day    Minutes of Exercise per Session: 10 min  Stress: Stress Concern Present (12/29/2022)   [place]    Feeling of Stress : Rather much  Social Connections: Socially Isolated (04/27/2022)   Social Connection and Isolation Panel [NHANES]    Frequency of Communication with Friends and Family: More than three times a week    Frequency of Social Gatherings with Friends and Family: Twice a week    Attends Religious Services: Never    Marine scientist or Organizations: Not on file    Attends Archivist Meetings: Never    Marital Status: Widowed    Review of Systems  Constitutional:  Negative for chills and fatigue.  HENT:  Negative for congestion, ear pain, sinus pain and sore throat.   Respiratory:  Negative for cough and shortness of breath.   Cardiovascular:  Negative for chest pain and leg swelling.  Gastrointestinal:  Negative for abdominal pain, constipation, diarrhea, nausea and vomiting.  Genitourinary:  Negative for dysuria and frequency.  Musculoskeletal:  Negative for arthralgias, back pain and myalgias.  Neurological:  Negative for dizziness and headaches.      12/30/2022     1:30 PM 12/03/2022   11:42 AM 03/24/2020    2:03 PM  GAD 7 : Generalized Anxiety Score  Nervous, Anxious, on Edge 1 2 2   Control/stop worrying 2 2 1   Worry too much - different things 2 3 1   Trouble relaxing 2 2 2   Restless 0 3 2  Easily annoyed or irritable 0 2 2  Afraid - awful might happen 0 2 2  Total GAD 7 Score 7 16 12   Anxiety Difficulty Somewhat difficult  Very difficult     [facility] Visit from 12/30/2022 in [facility]  PHQ-9 Total Score 10        Objective:  BP 120/70   Pulse 79   Temp (!) 97.2 F (36.2 C)   Resp 16   Ht 5\' 6"  (1.676 m)   Wt 182 lb (82.6 kg)   LMP  (LMP Unknown)   SpO2 98%   BMI 29.38 kg/m      12/30/2022    1:25 PM 12/27/2022    2:32 PM 12/23/2022    5:41 PM  BP/Weight  Systolic BP 409 811 914  Diastolic BP 70 80 70  Wt. (Lbs) 182 177 178  BMI 29.38 kg/m2 29.45 kg/m2 29.62 kg/m2    Physical Exam Vitals reviewed.  Constitutional:      Appearance: Normal appearance. She is normal weight.  Neck:     Vascular: No carotid bruit.  Cardiovascular:     Rate and Rhythm: Normal rate and regular rhythm.     Heart sounds: Normal heart sounds.  Pulmonary:     Effort: Pulmonary effort is normal.     Breath sounds: Normal breath sounds.  Abdominal:     General: Abdomen is flat. Bowel sounds are normal.     Palpations: Abdomen is soft.     Tenderness: There is no abdominal tenderness. There is no right CVA tenderness or left CVA tenderness.     Comments: Negative for suprapubic tenderness and CVA tenderness  Musculoskeletal:     Cervical back: Normal range of motion.  Neurological:     Mental Status: She  is alert and oriented to person, place, and time.  Psychiatric:        Mood and Affect: Mood normal.        Behavior: Behavior normal.    Lab Results  Component Value Date   WBC 4.8 08/10/2022   HGB 13.9 08/10/2022   HCT 42.9 08/10/2022   PLT 263 08/10/2022   GLUCOSE 100 (H) 10/12/2022   CHOL 154 10/12/2022   TRIG  67 10/12/2022   HDL 82 10/12/2022   LDLCALC 59 10/12/2022   ALT 14 10/12/2022   AST 14 10/12/2022   NA 143 10/12/2022   K 4.6 10/12/2022   CL 105 10/12/2022   CREATININE 0.70 10/12/2022   BUN 22 10/12/2022   CO2 25 10/12/2022   TSH 2.920 08/10/2022   INR 0.95 05/03/2018    Assessment & Plan:   Problem List Items Addressed This Visit       Genitourinary   Acute cystitis with hematuria - Primary     Increase fluid intake to a minimum of 8, 8 ounce glasses of water per day to help flush the kidneys and bladder.  The medication Pyridium may help with symptoms of bladder spasms, abdominal pain, and burning. This medication will turn your urine bright orange. This is expected and nothing to be concerned about. I have prescribed this for you in addition to the antibiotic.   If you begin to have worsening symptoms, low back pain, fevers, chills, nausea, vomiting, diarrhea, or decreased urine, please let us know.  Please finish all of your antibiotic even if you begin to feel better before you have completed the full course. It takes the full dose to be completely effective against the bacteria present. Stopping the antibiotic early can lead to the bacteria becoming resistant to the antibiotic and puts you at risk of the antibiotic not working for you in the future.       Relevant Orders   POCT URINALYSIS DIP (CLINITEK) (Completed)     Other   Depression with anxiety    PHQ today 7 (it was 16 last time) Continue Wellbutrin 150 mg Continue Lexapro 10 mg OD  I've explained to her that drugs of the SSRI class can have side effects such as weight gain, sexual dysfunction, insomnia, headache, nausea. These medications are generally effective at alleviating symptoms of anxiety and/or depression. Let me know if significant side effects do occur.        Follow-up: as needed if symptoms fails to worsen after completion the dose of antibiotics I, Presli Fanguy have reviewed all documentation  for this visit. The documentation on 12/30/22   for the exam, diagnosis, procedures, and orders are all accurate and complete.     An After Visit Summary was printed and given to the patient.  Neil Crouch, DNP, [place] [phone]

## 2022-12-30 NOTE — Assessment & Plan Note (Addendum)
PHQ today 7 (it was 16 last time) Continue Wellbutrin 150 mg Continue Lexapro 10 mg OD  I've explained to her that drugs of the SSRI class can have side effects such as weight gain, sexual dysfunction, insomnia, headache, nausea. These medications are generally effective at alleviating symptoms of anxiety and/or depression. Let me know if significant side effects do occur.

## 2022-12-31 ENCOUNTER — Encounter (HOSPITAL_COMMUNITY): Payer: Self-pay

## 2022-12-31 ENCOUNTER — Encounter (HOSPITAL_COMMUNITY)
Admission: RE | Admit: 2022-12-31 | Discharge: 2022-12-31 | Disposition: A | Discharge status: Home / Self Care | Payer: Medicare HMO | Source: Ambulatory Visit | Attending: Neurological Surgery | Admitting: Neurological Surgery

## 2022-12-31 ENCOUNTER — Other Ambulatory Visit: Payer: Self-pay

## 2022-12-31 VITALS — BP 124/79 | HR 73 | Temp 98.0°F | Resp 17 | Ht 66.0 in | Wt 182.0 lb

## 2022-12-31 DIAGNOSIS — Z01818 Encounter for other preprocedural examination: Secondary | ICD-10-CM | POA: Diagnosis not present

## 2022-12-31 DIAGNOSIS — I10 Essential (primary) hypertension: Secondary | ICD-10-CM | POA: Insufficient documentation

## 2022-12-31 LAB — SURGICAL PCR SCREEN
MRSA, PCR: NEGATIVE
Staphylococcus aureus: NEGATIVE

## 2022-12-31 LAB — BASIC METABOLIC PANEL
Anion gap: 10 (ref 5–15)
BUN: 15 mg/dL (ref 8–23)
CO2: 22 mmol/L (ref 22–32)
Calcium: 8.8 mg/dL — ABNORMAL LOW (ref 8.9–10.3)
Chloride: 107 mmol/L (ref 98–111)
Creatinine, Ser: 0.77 mg/dL (ref 0.44–1.00)
GFR, Estimated: >60 mL/min (ref >60)
Glucose, Bld: 94 mg/dL (ref 70–99)
Potassium: 4.1 mmol/L (ref 3.5–5.1)
Sodium: 139 mmol/L (ref 135–145)

## 2022-12-31 LAB — CBC
HCT: 38.5 % (ref 36.0–46.0)
Hemoglobin: 12.2 g/dL (ref 12.0–15.0)
MCH: 29.4 pg (ref 26.0–34.0)
MCHC: 31.7 g/dL (ref 30.0–36.0)
MCV: 92.8 fL (ref 80.0–100.0)
Platelets: 233 10*3/uL (ref 150–400)
RBC: 4.15 MIL/uL (ref 3.87–5.11)
RDW: 13.8 % (ref 11.5–15.5)
WBC: 5.4 10*3/uL (ref 4.0–10.5)
nRBC: 0 % (ref 0.0–0.2)

## 2022-12-31 LAB — TYPE AND SCREEN
ABO/RH(D): O POS
Antibody Screen: NEGATIVE

## 2022-12-31 LAB — URINE CULTURE

## 2022-12-31 LAB — PROTIME-INR
INR: 1 (ref 0.8–1.2)
Prothrombin Time: 13.2 seconds (ref 11.4–15.2)

## 2022-12-31 NOTE — Progress Notes (Signed)
PCP - Lurline Del Cardiologist - Dr. Gypsy Balsam Oncologist: Dr. Si Gaul  PPM/ICD - Denies  Chest x-ray - N/A EKG - 12/31/22 Stress Test - Denies ECHO - 09/14/22 Cardiac Cath - Denies  Sleep Study - Denies  Diabetes: Denies  Blood Thinner Instructions: N/A Aspirin Instructions: N/A  ERAS Protcol - Yes PRE-SURGERY Ensure or G2- No  COVID TEST- N/A   Anesthesia review: Yes, cardiac hx  Patient denies shortness of breath, fever, cough and chest pain at PAT appointment   All instructions explained to the patient, with a verbal understanding of the material. Patient agrees to go over the instructions while at home for a better understanding. Patient also instructed to self quarantine after being tested for COVID-19. The opportunity to ask questions was provided.

## 2023-01-01 ENCOUNTER — Other Ambulatory Visit: Payer: Self-pay

## 2023-01-01 LAB — URINE CULTURE

## 2023-01-03 ENCOUNTER — Other Ambulatory Visit: Payer: Self-pay

## 2023-01-03 MED ORDER — OXYCODONE HCL 10 MG PO TABS: 10.0000 mg | ORAL_TABLET | Freq: Three times a day (TID) | ORAL | 0 refills | Status: DC | PRN

## 2023-01-03 NOTE — Telephone Encounter (Signed)
Patient takes oxycodone-acetaminophen 10-325 mg every 8 hours PRN. However, They do not have it on stock. She asked if can you send only oxycodone 10 mg TID PRN to [facility] in [place] because they have this medication. Please advice.

## 2023-01-04 ENCOUNTER — Encounter (HOSPITAL_COMMUNITY): Payer: Self-pay | Admitting: Vascular Surgery

## 2023-01-04 NOTE — Progress Notes (Addendum)
Anesthesia Chart Review:  Case: [id number] Date/Time: 01/07/23 1431   Procedure: Laminectomy  - L1-L2 - L2-L3 with non-instrumented posterior lateral fusion (Back)   Anesthesia type: General   Pre-op diagnosis: Radiculopathy   Location: [facility] OR ROOM 21 / [place] [facility]   Surgeons: Eustace Moore, MD       DISCUSSION: Patient is a 72 year old female scheduled for the above procedure.  History includes former smoker (quit 07/27/99), HTN, right lung cancer (diagnosed 06/2015; s/p Right VATS, RU Lobectomy 07/22/15 in [place], s/p chemotherapy), pericardial cyst, fibromyalgia, Graves' disease (s/p thyroidectomy; post surgical hypothyroidism), GERD, aortic atherosclerosis, osteoarthritis (left TKA), spinal surgery (L3-5 fusion 2019). E. Coli UTI 12/27/22.  She saw CT surgeon Dr. Melodie Bouillon, cardiologists Dr. Jenne Campus and Dr. Berniece Salines, and pulmonologist Dr. June Leap in 2022 for finding of pericardial cyst on imaging for right lung cancer surveillance. 03/27/21 office note by Dr. Kipp Brood suggests she did have some progressive DOE after bout of bronchitis but also with emphysematous changes on imaging. He recommended pulmonology evaluation to determine if any respiratory symptoms were related to the pericardial cyst pushing on the left atrium. Then plan to reassess management including whether pericardial cyst resection was warranted. Subsequent cardiology and pulmonology visits outlined below, but I don't see any additional CT surgery, cardiology, or pulmonology visits since 2022.     Last pulmonology visit was on 08/03/21 with Geraldo Pitter, NP for six week follow-up PFTs. Known DOE with moderate exertion. No evidence of obstructive airway disease on PFTs. Trial de-escalating Breztri inhaler and if symptoms worsen consider resuming maintenance inhaler. She was also referred back to cardiology due to aortic atherosclerosis, borderline cardiomegaly on imaging. Six month follow-up planned.    Last cardiology  office visit was on 08/18/21 with Dr. Agustin Cree. An echocardiogram and then a cMRI were ordered to further evaluation her know pericardial cyst. 09/14/21 echo showed EF 55 to 60%, no regional wall motion abnormalities, mild concentric LVH, grade 2 diastolic dysfunction, normal RV systolic function, mildly elevated PASP, trivial MR, small pericardial effusion anterior to the right ventricle, area of mixed hypoechoic fluid and suspected fat tissue with unclear margins, consider other testing modalities for better appreciation. 10/13/21 cMRI showed normal LV/RV systolic function, no definite evidence of prior MI, myocarditis, or infiltrative disease, and a mass posterior to the left atrium consistent with pericardial cyst, 5 x 3.6 x 3.6 cm, slightly larger than prior with no mass effect on the left atrium. Dr. Agustin Cree reviewed and planned to discuss at follow-up which never occurred.   Discussed with anesthesiologist Oleta Mouse, MD. Recommend preoperative cardiology input. I will notify Dr. Ronnald Ramp' office.   ADDENDUM 01/12/23 4:00 PM: Patient had cardiology evaluation by Dr. Agustin Cree on 01/05/23. He recommended a preoperative echo to assess cyst and make sure it did not compress or create any hemodynamic compromise. He also recommended a stress test. Surgery was subsequently delayed until 01/14/23 in order to undergo these tests. He added, "If echocardiogram and stress test comes normal then we can proceed with surgery without hesitation from cardiac standpoint review."   01/06/23 echo showed preserved LVEF and mild MR, no pericardial effusion. 01/11/23 nuclear stress test was normal, EF 74%, low risk.  Anesthesia team to evaluate on the day of surgery.      VS: BP 124/79   Pulse 73   Temp 36.7 C   Resp 17   Ht 5\' 6"  (1.676 m)   Wt 82.6 kg   LMP  (LMP Unknown)  SpO2 97%   BMI 29.38 kg/m    PROVIDERS: Lurline Del, FNP is PCP ([facility]) Gypsy Balsam, MD is  cardiologist Si Gaul, MD is HEM-ONC. Last visit 09/06/22 with Heilingoetter, Cassandra, PA-C.  Most recent imaging did not show any concern for disease progression. Audie Box, DO is pulmonologist Brynda Greathouse, MD is CT surgery (evaluated for pericardial cyst)   LABS: Preoperative labs noted.  (all labs ordered are listed, but only abnormal results are displayed)  Labs Reviewed  BASIC METABOLIC PANEL - Abnormal; Notable for the following components:      Result Value   Calcium 8.8 (*)    All other components within normal limits  SURGICAL PCR SCREEN  PROTIME-INR  CBC  TYPE AND SCREEN    PFTs 07/21/21:"FVC 3/41 (113%), FEV1 2.54 (111%), ratio 74, TLC 105%, DLCOunc 18.14 (93%) Normal spirometry without BD response. Normal diffusion capacity."   IMAGES: CT L-spine with myelogram 11/22/22: MPRESSION: 1. Moderate multifactorial spinal stenosis T12-L1 with retrolisthesis a short. 2. Mild multifactorial spinal stenosis L1-2 with retrolisthesis. 3. Moderate multifactorial spinal stenosis L2-3 with right greater than left foraminal stenosis. 4. Solid-appearing L3-4 and L4-5 fusion. 5. Mild multifactorial spinal stenosis L5-S1 with bilateral subarticular stenosis. 6. Left nephrolithiasis. 7. Sigmoid diverticulosis. - Aortic Atherosclerosis (ICD10-I70.0).  MRI L-spine 10/21/22: IMPRESSION: 1. At L1-2 there is a minimal broad-based disc bulge. Moderate bilateral facet arthropathy. Moderate spinal stenosis. Severe bilateral foraminal stenosis. 2. At L2-3 there is a broad-based disc bulge. Moderate bilateral facet arthropathy. Moderate-severe spinal stenosis. Severe right and moderate-severe left foraminal stenosis. 3. At L5-S1 there is a minimal broad-based disc bulge. Severe bilateral facet arthropathy. Moderate left and mild right foraminal stenosis. 4. Posterior lumbar interbody fusion from L3 through L5 without foraminal or central canal stenosis. 5. No acute  osseous injury of the lumbar spine.   CT Chest 01/28/22: IMPRESSION: 1. Status post right upper lobectomy. No evidence of local tumor recurrence. 2. No evidence of metastatic disease in the chest. 3. Mild patchy subpleural reticulation and ground-glass opacity at both lung bases without significant traction bronchiectasis or frank honeycombing, mildly increased from prior, cannot exclude an interstitial lung disease such as nonspecific interstitial pneumonia (NSIP) or usual interstitial pneumonia (UIP). Consider pulmonology consultation and follow-up high-resolution chest CT study, as clinically warranted. 4. Three-vessel coronary atherosclerosis. 5. 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 6. Aortic Atherosclerosis (ICD10-I70.0) and Emphysema (ICD10-J43.9).    EKG: 12/31/22: NSR   CV: Nuclear stress test 01/11/23:   The study is normal. The study is low risk.   No ST deviation was noted.   Left ventricular function is normal. Nuclear stress EF: 74 %. The left ventricular ejection fraction is hyperdynamic (>65%). End diastolic cavity size is normal.   Prior study not available for comparison.   Echo 01/06/23: IMPRESSIONS   1. GLS -15.1. Left ventricular ejection fraction, by estimation, is 60 to  65%. The left ventricle has normal function. The left ventricle has no  regional wall motion abnormalities. Left ventricular diastolic parameters  are consistent with Grade I  diastolic dysfunction (impaired relaxation).   2. Right ventricular systolic function is normal. The right ventricular  size is normal. There is normal pulmonary artery systolic pressure.   3. The mitral valve is normal in structure. Mild mitral valve  regurgitation. No evidence of mitral stenosis.   4. The aortic valve is normal in structure. Aortic valve regurgitation is  not visualized. No aortic stenosis is present.   5.  The inferior vena cava is normal in size with greater  than 50%  respiratory variability, suggesting right atrial pressure of 3 mmHg.  - Pericardium: There is no evidence of pericardial effusion. (Comparison 09/14/21: LVEF 55-60%, no RWMA, mild concentric LVH, grade II DD, normal RVSF, mildly elevated PASP, mixed hypoechoic fluid and suspected fat tissue with unclear margins, small pericardial effusion, trivial MR)    MRI Cardiac 10/13/21: IMPRESSION: 1. Mass posterior to the left atrium consistent with pericardial cyst, 5 x 3.6 x 3.6 cm. This is slightly larger than prior. There does not seem to be mass effect on the left atrium. 2.  Normal LV size and systolic function, EF 60%. 3.  Normal RV size and systolic function, EF 67%. 4. No myocardial LGE, so no definite evidence for prior MI, myocarditis, or infiltrative disease.    Past Medical History:  Diagnosis Date   Acquired spondylolisthesis 05/12/2015   Adenocarcinoma of right lung, stage 1 (HCC) 07/26/2016   Anxiety    Arthritis    Arthropathy of lumbar facet joint 04/11/2019   Formatting of this note might be different from the original. Added automatically from request for surgery [id number]   Atherosclerosis of aorta (HCC) 07/08/2021   Benign essential hypertension 12/27/2016   BMI 30.0-30.9,adult 12/23/2020   COPD (chronic obstructive pulmonary disease) (HCC)    pt denies   Current mild episode of major depressive disorder (HCC) 03/24/2020   Daytime sleepiness 08/03/2021   Depression    Fibromyalgia 03/24/2020   Gastroesophageal reflux disease without esophagitis 03/24/2020   Graves disease 12/27/2016   Hypertension    Osteoporosis 08/03/2021   Pericardial cyst 02/02/2021   Postoperative hypothyroidism 01/17/2015   Reactive airways dysfunction syndrome, unspecified asthma severity, uncomplicated (HCC) 09/06/2015   S/P lobectomy of lung 07/26/2015   Formatting of this note might be different from the original. Right upper   S/P lumbar spinal fusion 05/11/2018   Thyroid  disease    Toe ulcer, left, limited to breakdown of skin (HCC) 12/23/2020    Past Surgical History:  Procedure Laterality Date   ANKLE SURGERY     bilateral   APPENDECTOMY     BREAST BIOPSY Left 02/25/2020   BREAST BIOPSY Right 08/17/2018   BREAST EXCISIONAL BIOPSY Right    unsure when but marked with scar marker   CHOLECYSTECTOMY     EYE MUSCLE SURGERY Bilateral    4-5 years ago   LUMBAR FUSION  2019   L4, 5, 6   REPLACEMENT TOTAL KNEE Left    THYROIDECTOMY      MEDICATIONS:  albuterol (VENTOLIN HFA) 108 (90 Base) MCG/ACT inhaler   ALPRAZolam (XANAX) 0.5 MG tablet   atorvastatin (LIPITOR) 40 MG tablet   buPROPion (WELLBUTRIN XL) 150 MG 24 hr tablet   Calcium Carb-Cholecalciferol (CALCIUM 500 + D3) 500-5 MG-MCG TABS   calcium carbonate (TUMS - DOSED IN MG ELEMENTAL CALCIUM) 500 MG chewable tablet   EPINEPHrine 0.3 mg/0.3 mL IJ SOAJ injection   escitalopram (LEXAPRO) 10 MG tablet   fluticasone (FLONASE) 50 MCG/ACT nasal spray   levothyroxine (SYNTHROID) 125 MCG tablet   losartan (COZAAR) 50 MG tablet   metroNIDAZOLE (METROGEL) 0.75 % gel   Multiple Vitamin (MULTIVITAMIN WITH MINERALS) TABS tablet   mupirocin ointment (BACTROBAN) 2 %   Oxycodone HCl 10 MG TABS   pantoprazole (PROTONIX) 40 MG tablet   pregabalin (LYRICA) 150 MG capsule   Spacer/Aero-Holding Chambers DEVI   sulfamethoxazole-trimethoprim (BACTRIM DS) 800-160 MG tablet   Teriparatide, Recombinant, (FORTEO)  600 MCG/2.4ML SOPN   trolamine salicylate (ASPERCREME) 10 % cream   No current facility-administered medications for this encounter.    Shonna Chock, PA-C Surgical Short Stay/Anesthesiology [facility] Phone [phone] [facility] Phone [phone] 01/04/2023 5:31 PM

## 2023-01-05 ENCOUNTER — Encounter: Payer: Self-pay | Admitting: Cardiology

## 2023-01-05 ENCOUNTER — Other Ambulatory Visit: Payer: Self-pay

## 2023-01-05 ENCOUNTER — Telehealth: Payer: Self-pay

## 2023-01-05 ENCOUNTER — Ambulatory Visit: Payer: Medicare HMO | Attending: Cardiology | Admitting: Cardiology

## 2023-01-05 VITALS — BP 118/60 | HR 78 | Ht 65.0 in | Wt 178.6 lb

## 2023-01-05 DIAGNOSIS — Z902 Acquired absence of lung [part of]: Secondary | ICD-10-CM | POA: Diagnosis not present

## 2023-01-05 DIAGNOSIS — R0609 Other forms of dyspnea: Secondary | ICD-10-CM | POA: Diagnosis not present

## 2023-01-05 DIAGNOSIS — J432 Centrilobular emphysema: Secondary | ICD-10-CM

## 2023-01-05 DIAGNOSIS — I10 Essential (primary) hypertension: Secondary | ICD-10-CM

## 2023-01-05 DIAGNOSIS — Q248 Other specified congenital malformations of heart: Secondary | ICD-10-CM | POA: Diagnosis not present

## 2023-01-05 DIAGNOSIS — I700 Atherosclerosis of aorta: Secondary | ICD-10-CM | POA: Diagnosis not present

## 2023-01-05 DIAGNOSIS — M47816 Spondylosis without myelopathy or radiculopathy, lumbar region: Secondary | ICD-10-CM

## 2023-01-05 DIAGNOSIS — F418 Other specified anxiety disorders: Secondary | ICD-10-CM

## 2023-01-05 DIAGNOSIS — M797 Fibromyalgia: Secondary | ICD-10-CM

## 2023-01-05 NOTE — Progress Notes (Signed)
Cardiology Office Note:    Date:  01/05/2023   ID:  Matilde Sprang, DOB 05/25/1951, MRN [id number]  PCP:  Lurline Del, FNP  Cardiologist:  Gypsy Balsam, MD    Referring MD: Lurline Del, FNP   Chief Complaint  Patient presents with   Clearance 01/07/2023    History of Present Illness:    Victoria Pena is a 72 y.o. female  with past medical history significant for adenocarcinoma of the right lung stage I status post robotic resection of the right upper lobe in 2017, essential hypertension, she was found to have pericardial cyst located posteriorly. She had quite extensive evaluation performed on the cyst. The problem is that cyst is growing. This size in July 2022 was 5.0/2.9 cm, CT from February was showing cyst measuring 4.6/2.5 cm. She was very appropriately sent to cardiothoracic surgeon. The plan was to have evaluation by pulmonary transferring out what his symptomatology is related to cyst what part of it is related to her lungs.  She was referred to me because she required back surgery which will be done under general anesthesia.  She cannot exercise much because of pain in her leg and pain in her back.  Described to have some shortness of breath.   Past Medical History:  Diagnosis Date   Acquired spondylolisthesis 05/12/2015   Adenocarcinoma of right lung, stage 1 (HCC) 07/26/2016   Anxiety    Arthritis    Arthropathy of lumbar facet joint 04/11/2019   Formatting of this note might be different from the original. Added automatically from request for surgery [id number]   Atherosclerosis of aorta (HCC) 07/08/2021   Benign essential hypertension 12/27/2016   BMI 30.0-30.9,adult 12/23/2020   COPD (chronic obstructive pulmonary disease) (HCC)    pt denies   Current mild episode of major depressive disorder (HCC) 03/24/2020   Daytime sleepiness 08/03/2021   Depression    Fibromyalgia 03/24/2020   Gastroesophageal reflux disease without esophagitis 03/24/2020   Graves  disease 12/27/2016   Hypertension    Osteoporosis 08/03/2021   Pericardial cyst 02/02/2021   Postoperative hypothyroidism 01/17/2015   Reactive airways dysfunction syndrome, unspecified asthma severity, uncomplicated (HCC) 09/06/2015   S/P lobectomy of lung 07/26/2015   Formatting of this note might be different from the original. Right upper   S/P lumbar spinal fusion 05/11/2018   Thyroid disease    Toe ulcer, left, limited to breakdown of skin (HCC) 12/23/2020    Past Surgical History:  Procedure Laterality Date   ANKLE SURGERY     bilateral   APPENDECTOMY     BREAST BIOPSY Left 02/25/2020   BREAST BIOPSY Right 08/17/2018   BREAST EXCISIONAL BIOPSY Right    unsure when but marked with scar marker   CHOLECYSTECTOMY     EYE MUSCLE SURGERY Bilateral    4-5 years ago   LUMBAR FUSION  2019   L4, 5, 6   REPLACEMENT TOTAL KNEE Left    THYROIDECTOMY      Current Medications: Current Meds  Medication Sig   albuterol (VENTOLIN HFA) 108 (90 Base) MCG/ACT inhaler Inhale 2 puffs into the lungs every 6 (six) hours as needed for wheezing or shortness of breath.   ALPRAZolam (XANAX) 0.5 MG tablet TAKE 1 TABLET(0.5 MG) BY MOUTH TWICE DAILY AS NEEDED FOR ANXIETY (Patient taking differently: Take 0.5 mg by mouth 2 (two) times daily as needed for anxiety. TAKE 1 TABLET(0.5 MG) BY MOUTH TWICE DAILY AS NEEDED FOR ANXIETY)   atorvastatin (LIPITOR) 40 MG  tablet TAKE 1 TABLET(40 MG) BY MOUTH DAILY (Patient taking differently: Take 40 mg by mouth daily.)   buPROPion (WELLBUTRIN XL) 150 MG 24 hr tablet Take 1 tablet (150 mg total) by mouth daily.   Calcium Carb-Cholecalciferol (CALCIUM 500 + D3) 500-5 MG-MCG TABS Take 3 tablets by mouth daily. (Patient taking differently: Take 1 tablet by mouth in the morning and at bedtime.)   calcium carbonate (TUMS - DOSED IN MG ELEMENTAL CALCIUM) 500 MG chewable tablet Chew 1 tablet by mouth 2 (two) times daily as needed for indigestion or heartburn.   EPINEPHrine  0.3 mg/0.3 mL IJ SOAJ injection Inject 0.3 mg into the muscle as needed for anaphylaxis.   escitalopram (LEXAPRO) 10 MG tablet Take 1 tablet (10 mg total) by mouth daily.   fluticasone (FLONASE) 50 MCG/ACT nasal spray Place 2 sprays into both nostrils daily. (Patient taking differently: Place 2 sprays into both nostrils at bedtime.)   levothyroxine (SYNTHROID) 125 MCG tablet TAKE 1 TABLET(125 MCG) BY MOUTH DAILY (Patient taking differently: Take 125 mcg by mouth daily before breakfast.)   losartan (COZAAR) 50 MG tablet Take 1 tablet (50 mg total) by mouth daily.   metroNIDAZOLE (METROGEL) 0.75 % gel Apply 1 application topically 2 (two) times daily. (Patient taking differently: Apply 1 application  topically 2 (two) times daily as needed (rosacea).)   Multiple Vitamin (MULTIVITAMIN WITH MINERALS) TABS tablet Take 1 tablet by mouth in the morning and at bedtime.   mupirocin ointment (BACTROBAN) 2 % Apply 1 Application topically 2 (two) times daily.   Oxycodone HCl 10 MG TABS Take 1 tablet (10 mg total) by mouth every 8 (eight) hours as needed. (Patient taking differently: Take 10 mg by mouth every 8 (eight) hours as needed (pain).)   pantoprazole (PROTONIX) 40 MG tablet Take 1 tablet (40 mg total) by mouth daily.   pregabalin (LYRICA) 150 MG capsule TAKE 1 CAPSULE(150 MG) BY MOUTH TWICE DAILY (Patient taking differently: Take 150 mg by mouth 2 (two) times daily.)   Spacer/Aero-Holding Chambers DEVI 1 each by Does not apply route daily.   sulfamethoxazole-trimethoprim (BACTRIM DS) 800-160 MG tablet Take 1 tablet by mouth 2 (two) times daily.   Teriparatide, Recombinant, (FORTEO) 600 MCG/2.4ML SOPN Inject 20 mcg into the skin daily. (Patient taking differently: Inject 20 mcg into the skin every 6 (six) months.)   trolamine salicylate (ASPERCREME) 10 % cream Apply 1 Application topically as needed for muscle pain.     Allergies:   Keflex [cephalexin], Robaxin [methocarbamol], Hydrocodone-acetaminophen,  Hydroxyzine, Prednisone & diphenhydramine, Amlodipine, Ciprofloxacin, Dextromethorphan polistirex er, Nitrofurantoin, Pravastatin, Diphenhydramine hcl, Moxifloxacin, and Prednisone   Social History   Socioeconomic History   Marital status: Widowed    Spouse name: Not on file   Number of children: Not on file   Years of education: Not on file   Highest education level: Not on file  Occupational History   Not on file  Tobacco Use   Smoking status: Former    Packs/day: 1.50    Years: 35.00    Total pack years: 52.50    Types: Cigarettes    Quit date: 07/27/1999    Years since quitting: 23.4   Smokeless tobacco: Never  Vaping Use   Vaping Use: Never used  Substance and Sexual Activity   Alcohol use: No   Drug use: No   Sexual activity: Not Currently  Other Topics Concern   Not on file  Social History Narrative   Not on file   Social  Determinants of Health   Financial Resource Strain: High Risk (12/29/2022)   Overall Financial Resource Strain (CARDIA)    Difficulty of Paying Living Expenses: Hard  Food Insecurity: Food Insecurity Present (12/16/2022)   Hunger Vital Sign    Worried About Running Out of Food in the Last Year: Often true    Ran Out of Food in the Last Year: Often true  Transportation Needs: No Transportation Needs (12/06/2022)   PRAPARE - Administrator, Civil Service (Medical): No    Lack of Transportation (Non-Medical): No  Physical Activity: Insufficiently Active (12/08/2022)   Exercise Vital Sign    Days of Exercise per Week: 1 day    Minutes of Exercise per Session: 10 min  Stress: Stress Concern Present (12/29/2022)   [employer] of Occupational Health - Occupational Stress Questionnaire    Feeling of Stress : Rather much  Social Connections: Socially Isolated (04/27/2022)   Social Connection and Isolation Panel [NHANES]    Frequency of Communication with Friends and Family: More than three times a week    Frequency of Social Gatherings  with Friends and Family: Twice a week    Attends Religious Services: Never    Database administrator or Organizations: Not on file    Attends Banker Meetings: Never    Marital Status: Widowed     Family History: The patient's family history includes Autism in her grandson; Cancer in her brother, father, and mother. There is no history of Breast cancer. ROS:   Please see the history of present illness.    All 14 point review of systems negative except as described per history of present illness  EKGs/Labs/Other Studies Reviewed:      Recent Labs: 08/10/2022: TSH 2.920 10/12/2022: ALT 14 12/31/2022: BUN 15; Creatinine, Ser 0.77; Hemoglobin 12.2; Platelets 233; Potassium 4.1; Sodium 139  Recent Lipid Panel    Component Value Date/Time   CHOL 154 10/12/2022 1433   TRIG 67 10/12/2022 1433   HDL 82 10/12/2022 1433   CHOLHDL 1.9 10/12/2022 1433   LDLCALC 59 10/12/2022 1433    Physical Exam:    VS:  BP 118/60 (BP Location: Left Arm, Patient Position: Sitting)   Pulse 78   Ht 5\' 5"  (1.651 m)   Wt 178 lb 9.6 oz (81 kg)   LMP  (LMP Unknown)   SpO2 95%   BMI 29.72 kg/m     Wt Readings from Last 3 Encounters:  01/05/23 178 lb 9.6 oz (81 kg)  12/31/22 182 lb (82.6 kg)  12/30/22 182 lb (82.6 kg)     GEN:  Well nourished, well developed in no acute distress HEENT: Normal NECK: No JVD; No carotid bruits LYMPHATICS: No lymphadenopathy CARDIAC: RRR, no murmurs, no rubs, no gallops RESPIRATORY:  Clear to auscultation without rales, wheezing or rhonchi  ABDOMEN: Soft, non-tender, non-distended MUSCULOSKELETAL:  No edema; No deformity  SKIN: Warm and dry LOWER EXTREMITIES: no swelling NEUROLOGIC:  Alert and oriented x 3 PSYCHIATRIC:  Normal affect   ASSESSMENT:    1. Pericardial cyst   2. Benign essential hypertension   3. Atherosclerosis of aorta (HCC)   4. Centrilobular emphysema (HCC)   5. Arthropathy of lumbar facet joint   6. S/P lobectomy of lung     PLAN:    In order of problems listed above:  History of pericardial cyst.  We must have echocardiogram done before the surgery.  I will make sure that cyst did not grow up anymore.  Last evaluation was done in 2022 at that time we did observe some growth of the cyst so the idea is to make sure that this is does not compress or create any hemodynamic consequences. Cardiovascular preop evaluation.  Karma Greaser does have calcification of the treat coronaries seen on CT.  Therefore evaluation for coronary artery disease must be done we will schedule her to have a stress test.  If echocardiogram and stress test comes normal then we can proceed with surgery without hesitation from cardiac standpoint review. COPD noted. Status post lobectomy.  Noted, done in the right upper lobe. Dyslipidemia I did review her K PN which show me her LDL 59 HDL 82.  Will continue present management which include clued Lipitor 40   Medication Adjustments/Labs and Tests Ordered: Current medicines are reviewed at length with the patient today.  Concerns regarding medicines are outlined above.  No orders of the defined types were placed in this encounter.  Medication changes: No orders of the defined types were placed in this encounter.   Signed, Georgeanna Lea, MD, [facility] 01/05/2023 4:49 PM    [facility]

## 2023-01-05 NOTE — Patient Instructions (Signed)
Medication Instructions:  Your physician recommends that you continue on your current medications as directed. Please refer to the Current Medication list given to you today.  *If you need a refill on your cardiac medications before your next appointment, please call your pharmacy*   Lab Work: None Ordered If you have labs (blood work) drawn today and your tests are completely normal, you will receive your results only by: MyChart Message (if you have MyChart) OR A paper copy in the mail If you have any lab test that is abnormal or we need to change your treatment, we will call you to review the results.   Testing/Procedures: Your physician has requested that you have an echocardiogram. Echocardiography is a painless test that uses sound waves to create images of your heart. It provides your doctor with information about the size and shape of your heart and how well your heart's chambers and valves are working. This procedure takes approximately one hour. There are no restrictions for this procedure. Please do NOT wear cologne, perfume, aftershave, or lotions (deodorant is allowed). Please arrive 15 minutes prior to your appointment time.   Your physician has requested that you have a lexiscan myoview. For further information please visit [email or web address] Please follow instruction sheet, as given.  The test will take approximately 3 to 4 hours to complete; you may bring reading material.  If someone comes with you to your appointment, they will need to remain in the main lobby due to limited space in the testing area.   How to prepare for your Myocardial Perfusion Test: Do not eat or drink 3 hours prior to your test, except you may have water. Do not consume products containing caffeine (regular or decaffeinated) 12 hours prior to your test. (ex: coffee, chocolate, sodas, tea). Do bring a list of your current medications with you.  If not listed below, you may take your medications as  normal. Do wear comfortable clothes (no dresses or overalls) and walking shoes, tennis shoes preferred (No heels or open toe shoes are allowed). Do NOT wear cologne, perfume, aftershave, or lotions (deodorant is allowed). If these instructions are not followed, your test will have to be rescheduled.     Follow-Up: At [facility], you and your health needs are our priority.  As part of our continuing mission to provide you with exceptional heart care, we have created designated Provider Care Teams.  These Care Teams include your primary Cardiologist (physician) and Advanced Practice Providers (APPs -  Physician Assistants and Nurse Practitioners) who all work together to provide you with the care you need, when you need it.  We recommend signing up for the patient portal called "MyChart".  Sign up information is provided on this After Visit Summary.  MyChart is used to connect with patients for Virtual Visits (Telemedicine).  Patients are able to view lab/test results, encounter notes, upcoming appointments, etc.  Non-urgent messages can be sent to your provider as well.   To learn more about what you can do with MyChart, go to [email or web address].    Your next appointment:   6 month(s)  The format for your next appointment:   In Person  Provider:   Gypsy Balsam, MD    Other Instructions NA .[email or web address]

## 2023-01-05 NOTE — Telephone Encounter (Signed)
   [facility] Pre-operative Risk Assessment    Request for surgical clearance:  What type of surgery is being performed? L1-2-3 Lumbar Laminectomy w/ non instrumental fusion    When is this surgery scheduled? 01/07/2023   What type of clearance is required (medical clearance vs. Pharmacy clearance to hold med vs. Both)? Both  Are there any medications that need to be held prior to surgery and how long?Not specified   Practice name and name of physician performing surgery? Dr. Sherley Bounds at [facility]   What is your office phone number: [phone]    7.   What is your office fax number: [phone] Att: Lorriane Shire  8.   Anesthesia type (None, local, MAC, general) ? General   Victoria Pena Victoria Pena 01/05/2023, 1:37 PM  _________________________________________________________________   (provider comments below)

## 2023-01-06 ENCOUNTER — Ambulatory Visit: Payer: Medicare HMO | Attending: Cardiology

## 2023-01-06 ENCOUNTER — Other Ambulatory Visit: Payer: Self-pay | Admitting: Nurse Practitioner

## 2023-01-06 ENCOUNTER — Telehealth (HOSPITAL_COMMUNITY): Payer: Self-pay | Admitting: *Deleted

## 2023-01-06 ENCOUNTER — Other Ambulatory Visit: Payer: Self-pay

## 2023-01-06 DIAGNOSIS — C3491 Malignant neoplasm of unspecified part of right bronchus or lung: Secondary | ICD-10-CM

## 2023-01-06 DIAGNOSIS — R0609 Other forms of dyspnea: Secondary | ICD-10-CM

## 2023-01-06 DIAGNOSIS — F418 Other specified anxiety disorders: Secondary | ICD-10-CM

## 2023-01-06 DIAGNOSIS — E890 Postprocedural hypothyroidism: Secondary | ICD-10-CM

## 2023-01-06 MED ORDER — ESCITALOPRAM OXALATE 10 MG PO TABS: 10.0000 mg | ORAL_TABLET | Freq: Every day | ORAL | 0 refills | Status: DC

## 2023-01-06 MED ORDER — LEVOTHYROXINE SODIUM 125 MCG PO TABS: 125.0000 ug | ORAL_TABLET | Freq: Every day | ORAL | 0 refills | Status: DC

## 2023-01-06 NOTE — Telephone Encounter (Signed)
Patient given detailed instructions per Myocardial Perfusion Study Information Sheet for the test on 01/11/2023 at 11:00. Patient notified to arrive 15 minutes early and that it is imperative to arrive on time for appointment to keep from having the test rescheduled.  If you need to cancel or reschedule your appointment, please call the office within 24 hours of your appointment. . Patient verbalized understanding.Victoria Pena

## 2023-01-07 LAB — ECHOCARDIOGRAM COMPLETE
Area-P 1/2: 3.56 cm2
S' Lateral: 3.1 cm

## 2023-01-10 ENCOUNTER — Other Ambulatory Visit: Payer: Self-pay | Admitting: Nurse Practitioner

## 2023-01-10 DIAGNOSIS — F418 Other specified anxiety disorders: Secondary | ICD-10-CM

## 2023-01-11 ENCOUNTER — Ambulatory Visit: Payer: Medicare HMO | Attending: Cardiology

## 2023-01-11 ENCOUNTER — Telehealth: Payer: Self-pay

## 2023-01-11 DIAGNOSIS — R0609 Other forms of dyspnea: Secondary | ICD-10-CM

## 2023-01-11 MED ORDER — TECHNETIUM TC 99M TETROFOSMIN IV KIT
31.6000 | PACK | Freq: Once | INTRAVENOUS | Status: AC | PRN
  Administered 2023-01-11: 31.6 via INTRAVENOUS

## 2023-01-11 MED ORDER — REGADENOSON 0.4 MG/5ML IV SOLN
0.4000 mg | Freq: Once | INTRAVENOUS | Status: AC
  Administered 2023-01-11: 0.4 mg via INTRAVENOUS

## 2023-01-11 MED ORDER — TECHNETIUM TC 99M TETROFOSMIN IV KIT
10.2000 | PACK | Freq: Once | INTRAVENOUS | Status: AC | PRN
  Administered 2023-01-11: 10.2 via INTRAVENOUS

## 2023-01-11 NOTE — Telephone Encounter (Signed)
LVM per DPR- per Dr. Vanetta Shawl note regarding normal Echo results. Encouraged to call with any questions. Routed to PCP.

## 2023-01-12 ENCOUNTER — Telehealth: Payer: Self-pay

## 2023-01-12 LAB — MYOCARDIAL PERFUSION IMAGING
LV dias vol: 58 mL (ref 46–106)
LV sys vol: 15 mL
Nuc Stress EF: 74 %
Peak HR: 93 {beats}/min
Rest HR: 69 {beats}/min
Rest Nuclear Isotope Dose: 10.2 mCi
SDS: 3
SRS: 0
SSS: 3
ST Depression (mm): 0 mm
Stress Nuclear Isotope Dose: 31.6 mCi
TID: 0.99

## 2023-01-12 NOTE — Telephone Encounter (Signed)
Patent was asking if her stress test results had come back. I looked in Epic and the test had not been interpreted yet. I spoke to Dr. Agustin Cree and he reviewed the test and stated that it was normal. I informed the patient of her stress test result and she had no further questions at this time.

## 2023-01-12 NOTE — Telephone Encounter (Signed)
Patient informed of results of her echo.

## 2023-01-12 NOTE — Telephone Encounter (Signed)
Pt is calling back in regards to results. Requesting return call.

## 2023-01-12 NOTE — Anesthesia Preprocedure Evaluation (Deleted)
Anesthesia Evaluation    Airway        Dental   Pulmonary former smoker          Cardiovascular hypertension,   Nuclear stress test 01/11/23:   The study is normal. The study is low risk.   No ST deviation was noted.   Left ventricular function is normal. Nuclear stress EF: 74 %. The left ventricular ejection fraction is hyperdynamic (>65%). End diastolic cavity size is normal.   Prior study not available for comparison.     Echo 01/06/23: IMPRESSIONS   1. GLS -15.1. Left ventricular ejection fraction, by estimation, is 60 to  65%. The left ventricle has normal function. The left ventricle has no  regional wall motion abnormalities. Left ventricular diastolic parameters  are consistent with Grade I  diastolic dysfunction (impaired relaxation).   2. Right ventricular systolic function is normal. The right ventricular  size is normal. There is normal pulmonary artery systolic pressure.   3. The mitral valve is normal in structure. Mild mitral valve  regurgitation. No evidence of mitral stenosis.   4. The aortic valve is normal in structure. Aortic valve regurgitation is  not visualized. No aortic stenosis is present.   5. The inferior vena cava is normal in size with greater than 50%  respiratory variability, suggesting right atrial pressure of 3 mmHg.  - Pericardium: There is no evidence of pericardial effusion.    Neuro/Psych    GI/Hepatic   Endo/Other    Renal/GU      Musculoskeletal   Abdominal   Peds  Hematology   Anesthesia Other Findings   Reproductive/Obstetrics                             Anesthesia Physical Anesthesia Plan  ASA:   Anesthesia Plan:    Post-op Pain Management:    Induction:   PONV Risk Score and Plan:   Airway Management Planned:   Additional Equipment:   Intra-op Plan:   Post-operative Plan:   Informed Consent:   Plan Discussed with:    Anesthesia Plan Comments: (PAT note written by Myra Gianotti, PA-C.  )       Anesthesia Quick Evaluation

## 2023-01-13 ENCOUNTER — Ambulatory Visit (INDEPENDENT_AMBULATORY_CARE_PROVIDER_SITE_OTHER): Payer: Medicare HMO | Admitting: Family Medicine

## 2023-01-13 ENCOUNTER — Ambulatory Visit (INDEPENDENT_AMBULATORY_CARE_PROVIDER_SITE_OTHER): Payer: Medicare HMO

## 2023-01-13 ENCOUNTER — Telehealth: Payer: Self-pay

## 2023-01-13 ENCOUNTER — Other Ambulatory Visit: Payer: Self-pay | Admitting: Family Medicine

## 2023-01-13 ENCOUNTER — Telehealth: Payer: Medicare HMO

## 2023-01-13 VITALS — BP 114/72 | HR 74 | Temp 97.1°F | Ht 65.0 in | Wt 178.0 lb

## 2023-01-13 DIAGNOSIS — N3000 Acute cystitis without hematuria: Secondary | ICD-10-CM | POA: Diagnosis not present

## 2023-01-13 DIAGNOSIS — J432 Centrilobular emphysema: Secondary | ICD-10-CM

## 2023-01-13 DIAGNOSIS — N3001 Acute cystitis with hematuria: Secondary | ICD-10-CM

## 2023-01-13 DIAGNOSIS — I10 Essential (primary) hypertension: Secondary | ICD-10-CM

## 2023-01-13 DIAGNOSIS — F418 Other specified anxiety disorders: Secondary | ICD-10-CM

## 2023-01-13 LAB — POCT URINALYSIS DIP (CLINITEK)
Blood, UA: NEGATIVE
Glucose, UA: NEGATIVE mg/dL
Ketones, POC UA: NEGATIVE mg/dL
Nitrite, UA: NEGATIVE
Spec Grav, UA: 1.015 (ref 1.010–1.025)
Urobilinogen, UA: NEGATIVE E.U./dL — AB
pH, UA: 7 (ref 5.0–8.0)

## 2023-01-13 MED ORDER — SULFAMETHOXAZOLE-TRIMETHOPRIM 800-160 MG PO TABS: 1.0000 | ORAL_TABLET | Freq: Two times a day (BID) | ORAL | 0 refills | Status: DC

## 2023-01-13 MED ORDER — CEFTRIAXONE SODIUM 1 G IJ SOLR
1.0000 g | Freq: Once | INTRAMUSCULAR | Status: AC
  Administered 2023-01-13: 1 g via INTRAMUSCULAR

## 2023-01-13 NOTE — Chronic Care Management (AMB) (Signed)
Chronic Care Management   CCM RN Visit Note  01/13/2023 Name: Victoria Pena MRN: [id number] DOB: 1951/10/28  Subjective: Victoria Pena is a 72 y.o. year old female who is a primary care patient of Neil Crouch, FNP. The patient was referred to the Chronic Care Management team for assistance with care management needs subsequent to provider initiation of CCM services and plan of care.    Today's Visit:  Engaged with patient by telephone for initial visit.     SDOH Interventions Today    Flowsheet Row Most Recent Value  SDOH Interventions   Food Insecurity Interventions Other (Comment)  [working with LCSW and has resources from the care guides for food banks]  Housing Interventions Intervention Not Indicated  Transportation Interventions Intervention Not Indicated  Utilities Interventions Other (Comment)  [resources from care guides, working with SW]  Alcohol Usage Interventions Intervention Not Indicated (Score <7)  Financial Strain Interventions Other (Comment)  [community resources and Education officer, museum assistance]  Stress Interventions Other (Comment)  [patient with chronic pain and stress about surgery and finances, working with SW and care guides]  Social Connections Interventions Other (Comment)  [has good support from her children]         Goals Addressed             This Visit's Progress    CCM Expected Outcome:  Monitor, Self-Manage and Reduce Symptoms of: Depression, Anxiety, Stress       Current Barriers:  Care Coordination needs related to resources in the community for help with mental health needs and community resources for financial support in a patient with anxiety, depression, and stress Chronic Disease Management support and education needs related to effective management of stress, anxiety, and depression  Planned Interventions: Evaluation of current treatment plan related to stress, anxiety, and depression and patient's adherence to plan as established  by provider Advised patient to call the office for changes in stress, anxiety, and depression Provided education to patient re: working with the CCM team, support available with resources, ongoing outreaches for effective management of stress, anxiety, and depression Reviewed medications with patient and discussed compliance Reviewed scheduled/upcoming provider appointments including 02-15-2023 at 1120 am Care Guide referral for resources in the community to help with food resources and financial constraints. Has been provided with information.  Social Work referral for ongoing support and education related to effective management of depression, anxiety, stress Pharmacy referral for ongoing support and education of medication needs and management Discussed plans with patient for ongoing care management follow up and provided patient with direct contact information for care management team Advised patient to discuss changes in mood, anxiety, depression, or mental health needs with provider Screening for signs and symptoms of depression related to chronic disease state  Assessed social determinant of health barriers  Symptom Management: Take medications as prescribed   Attend all scheduled provider appointments Call provider office for new concerns or questions  call the Suicide and Crisis Lifeline: 988 call the [place] National Suicide Prevention Lifeline: 669-152-5809 or TTY: 726-639-0423 TTY 320-506-6621) to talk to a trained counselor call 1-800-273-TALK (toll free, 24 hour hotline) if experiencing a Mental Health or [place]   Follow Up Plan: Telephone follow up appointment with care management team member scheduled for: 02-10-2023 at 345 pm        CCM Expected Outcome:  Monitor, Self-Manage, and Reduce Symptoms of Hypertension       Current Barriers:  Chronic Disease Management support and education needs related  to effective management of HTN  Planned  Interventions: Evaluation of current treatment plan related to hypertension self management and patient's adherence to plan as established by provider;   Provided education to patient re: stroke prevention, s/s of heart attack and stroke; Reviewed prescribed diet heart healthy diet Reviewed medications with patient and discussed importance of compliance;  Discussed plans with patient for ongoing care management follow up and provided patient with direct contact information for care management team; Advised patient, providing education and rationale, to monitor blood pressure daily and record, calling PCP for findings outside established parameters;  Reviewed scheduled/upcoming provider appointments including: 02-15-2023 at 1120 am Advised patient to discuss changes in HTN or heart health with provider; Provided education on prescribed diet heart healthy ;  Discussed complications of poorly controlled blood pressure such as heart disease, stroke, circulatory complications, vision complications, kidney impairment, sexual dysfunction;  Screening for signs and symptoms of depression related to chronic disease state;  Assessed social determinant of health barriers;   Symptom Management: Take medications as prescribed   Attend all scheduled provider appointments Call provider office for new concerns or questions  call the Suicide and Crisis Lifeline: 988 call the [place] National Suicide Prevention Lifeline: 4194653184 or TTY: (737) 739-3691 TTY 628-854-8815) to talk to a trained counselor call 1-800-273-TALK (toll free, 24 hour hotline) if experiencing a Mental Health or [place]  check blood pressure weekly learn about high blood pressure call doctor for signs and symptoms of high blood pressure develop an action plan for high blood pressure keep all doctor appointments take medications for blood pressure exactly as prescribed report new symptoms to your doctor  Follow Up  Plan: Telephone follow up appointment with care management team member scheduled for: 02-10-2023 at 345 pm       CCM:  Maintain, Monitor and Self-Manage Symptoms of COPD       Current Barriers:  Chronic Disease Management support and education needs related to effective COPD  Planned Interventions: Provided patient with basic written and verbal COPD education on self care/management/and exacerbation prevention Advised patient to track and manage COPD triggers Provided written and verbal instructions on pursed lip breathing and utilized returned demonstration as teach back Provided instruction about proper use of medications used for management of COPD including inhalers Advised patient to self assesses COPD action plan zone and make appointment with provider if in the yellow zone for 48 hours without improvement Advised patient to engage in light exercise as tolerated 3-5 days a week to aid in the the management of COPD Provided education about and advised patient to utilize infection prevention strategies to reduce risk of respiratory infection Discussed the importance of adequate rest and management of fatigue with COPD Screening for signs and symptoms of depression related to chronic disease state  Assessed social determinant of health barriers  Symptom Management: Take medications as prescribed   Attend all scheduled provider appointments Call provider office for new concerns or questions  call the Suicide and Crisis Lifeline: 988 call the [place] National Suicide Prevention Lifeline: 541-809-5677 or TTY: 808-783-8622 TTY 831-480-8198) to talk to a trained counselor call 1-800-273-TALK (toll free, 24 hour hotline) if experiencing a Mental Health or [place]  identify and remove indoor air pollutants limit outdoor activity during cold weather listen for public air quality announcements every day develop a rescue plan eliminate symptom triggers at home follow rescue  plan if symptoms flare-up  Follow Up Plan: Telephone follow up appointment with care management team  member scheduled for: 02-10-2023 and 345 pm          Plan:Telephone follow up appointment with care management team member scheduled for:  02-10-2023 at 71 pm  [place], MSN, CCM RN Care Manager  Chronic Care Management Direct Number: [phone]

## 2023-01-13 NOTE — Patient Instructions (Addendum)
Bactrim DS 1 tablet twice a day for 5 days, than continue daily until your next appointment on 02/15/2023  Repeat UA in office the day before your surgery.

## 2023-01-13 NOTE — Telephone Encounter (Signed)
Victoria Pena called with complaints of urinary urgency.  She has had no burning or pain.  She is scheduled for lumbar spine surgery tomorrow.   Dr. Tobie Poet advised that she come in for a nurse visit and give [place] a urine.

## 2023-01-13 NOTE — Progress Notes (Unsigned)
Acute Office Visit  Subjective:    Patient ID: Victoria Pena, female    DOB: Jan 22, 1951, 72 y.o.   MRN: [id number]  No chief complaint on file.   HPI: Patient is in today for ***  Past Medical History:  Diagnosis Date   Acquired spondylolisthesis 05/12/2015   Adenocarcinoma of right lung, stage 1 ([place]) 07/26/2016   Anxiety    Arthritis    Arthropathy of lumbar facet joint 04/11/2019   Formatting of this note might be different from the original. Added automatically from request for surgery [id number]   Atherosclerosis of aorta ([place]) 07/08/2021   Benign essential hypertension 12/27/2016   BMI 30.0-30.9,adult 12/23/2020   COPD (chronic obstructive pulmonary disease) ([place])    pt denies   Current mild episode of major depressive disorder ([place]) 03/24/2020   Daytime sleepiness 08/03/2021   Depression    Fibromyalgia 03/24/2020   Gastroesophageal reflux disease without esophagitis 03/24/2020   Graves disease 12/27/2016   Hypertension    Osteoporosis 08/03/2021   Pericardial cyst 02/02/2021   Postoperative hypothyroidism 01/17/2015   Reactive airways dysfunction syndrome, unspecified asthma severity, uncomplicated ([place]) 00/37/0488   S/P lobectomy of lung 07/26/2015   Formatting of this note might be different from the original. Right upper   S/P lumbar spinal fusion 05/11/2018   Thyroid disease    Toe ulcer, left, limited to breakdown of skin ([place]) 12/23/2020    Past Surgical History:  Procedure Laterality Date   ANKLE SURGERY     bilateral   APPENDECTOMY     BREAST BIOPSY Left 02/25/2020   BREAST BIOPSY Right 08/17/2018   BREAST EXCISIONAL BIOPSY Right    unsure when but marked with scar marker   CHOLECYSTECTOMY     EYE MUSCLE SURGERY Bilateral    4-5 years ago   LUMBAR FUSION  2019   L4, 5, 6   REPLACEMENT TOTAL KNEE Left    THYROIDECTOMY      Family History  Problem Relation Age of Onset   Cancer Mother    Cancer Father    Cancer Brother    Autism  Grandson    Breast cancer Neg Hx     Social History   Socioeconomic History   Marital status: Widowed    Spouse name: Not on file   Number of children: Not on file   Years of education: Not on file   Highest education level: Not on file  Occupational History   Not on file  Tobacco Use   Smoking status: Former    Packs/day: 1.50    Years: 35.00    Total pack years: 52.50    Types: Cigarettes    Quit date: 07/27/1999    Years since quitting: 23.4   Smokeless tobacco: Never  Vaping Use   Vaping Use: Never used  Substance and Sexual Activity   Alcohol use: No   Drug use: No   Sexual activity: Not Currently  Other Topics Concern   Not on file  Social History Narrative   Not on file   Social Determinants of Health   Financial Resource Strain: High Risk (12/29/2022)   Overall Financial Resource Strain (CARDIA)    Difficulty of Paying Living Expenses: Hard  Food Insecurity: Food Insecurity Present (12/16/2022)   Hunger Vital Sign    Worried About Running Out of Food in the Last Year: Often true    Ran Out of Food in the Last Year: Often true  Transportation Needs: No Transportation Needs (12/06/2022)  PRAPARE - Hydrologist (Medical): No    Lack of Transportation (Non-Medical): No  Physical Activity: Insufficiently Active (12/08/2022)   Exercise Vital Sign    Days of Exercise per Week: 1 day    Minutes of Exercise per Session: 10 min  Stress: Stress Concern Present (12/29/2022)   [place]    Feeling of Stress : Rather much  Social Connections: Socially Isolated (04/27/2022)   Social Connection and Isolation Panel [NHANES]    Frequency of Communication with Friends and Family: More than three times a week    Frequency of Social Gatherings with Friends and Family: Twice a week    Attends Religious Services: Never    Marine scientist or Organizations: Not on file     Attends Archivist Meetings: Never    Marital Status: Widowed  Intimate Partner Violence: Not At Risk (04/27/2022)   Humiliation, Afraid, Rape, and Kick questionnaire    Fear of Current or Ex-Partner: No    Emotionally Abused: No    Physically Abused: No    Sexually Abused: No    Outpatient Medications Prior to Visit  Medication Sig Dispense Refill   albuterol (VENTOLIN HFA) 108 (90 Base) MCG/ACT inhaler Inhale 2 puffs into the lungs every 6 (six) hours as needed for wheezing or shortness of breath. 8 g 0   ALPRAZolam (XANAX) 0.5 MG tablet TAKE 1 TABLET(0.5 MG) BY MOUTH TWICE DAILY AS NEEDED FOR ANXIETY (Patient taking differently: Take 0.5 mg by mouth 2 (two) times daily as needed for anxiety. TAKE 1 TABLET(0.5 MG) BY MOUTH TWICE DAILY AS NEEDED FOR ANXIETY) 60 tablet 3   atorvastatin (LIPITOR) 40 MG tablet TAKE 1 TABLET(40 MG) BY MOUTH DAILY (Patient taking differently: Take 40 mg by mouth daily.) 90 tablet 2   buPROPion (WELLBUTRIN XL) 150 MG 24 hr tablet Take 1 tablet (150 mg total) by mouth daily. 90 tablet 0   Calcium Carb-Cholecalciferol (CALCIUM 500 + D3) 500-5 MG-MCG TABS Take 3 tablets by mouth daily. (Patient taking differently: Take 1 tablet by mouth in the morning and at bedtime.) 100 tablet 5   calcium carbonate (TUMS - DOSED IN MG ELEMENTAL CALCIUM) 500 MG chewable tablet Chew 1 tablet by mouth 2 (two) times daily as needed for indigestion or heartburn.     EPINEPHrine 0.3 mg/0.3 mL IJ SOAJ injection Inject 0.3 mg into the muscle as needed for anaphylaxis. 1 each 1   escitalopram (LEXAPRO) 10 MG tablet Take 1 tablet (10 mg total) by mouth daily. 30 tablet 0   fluticasone (FLONASE) 50 MCG/ACT nasal spray Place 2 sprays into both nostrils daily. (Patient taking differently: Place 2 sprays into both nostrils at bedtime.) 16 g 6   levothyroxine (SYNTHROID) 125 MCG tablet Take 1 tablet (125 mcg total) by mouth daily before breakfast. TAKE 1 TABLET(125 MCG) BY MOUTH DAILY  Strength: 125 mcg 90 tablet 0   losartan (COZAAR) 50 MG tablet Take 1 tablet (50 mg total) by mouth daily. 90 tablet 0   metroNIDAZOLE (METROGEL) 0.75 % gel Apply 1 application topically 2 (two) times daily. (Patient taking differently: Apply 1 application  topically 2 (two) times daily as needed (rosacea).) 45 g 3   Multiple Vitamin (MULTIVITAMIN WITH MINERALS) TABS tablet Take 1 tablet by mouth in the morning and at bedtime.     mupirocin ointment (BACTROBAN) 2 % Apply 1 Application topically 2 (two) times daily. 22 g 0  Oxycodone HCl 10 MG TABS Take 1 tablet (10 mg total) by mouth every 8 (eight) hours as needed. (Patient taking differently: Take 10 mg by mouth every 8 (eight) hours as needed (pain).) 90 tablet 0   pantoprazole (PROTONIX) 40 MG tablet Take 1 tablet (40 mg total) by mouth daily. 90 tablet 2   pregabalin (LYRICA) 150 MG capsule TAKE 1 CAPSULE(150 MG) BY MOUTH TWICE DAILY (Patient taking differently: Take 150 mg by mouth 2 (two) times daily.) 60 capsule 3   Spacer/Aero-Holding Chambers DEVI 1 each by Does not apply route daily. 1 each 0   sulfamethoxazole-trimethoprim (BACTRIM DS) 800-160 MG tablet Take 1 tablet by mouth 2 (two) times daily. 14 tablet 0   Teriparatide, Recombinant, (FORTEO) 600 MCG/2.4ML SOPN Inject 20 mcg into the skin daily. (Patient taking differently: Inject 20 mcg into the skin every 6 (six) months.) 2.4 mL 6   trolamine salicylate (ASPERCREME) 10 % cream Apply 1 Application topically as needed for muscle pain.     No facility-administered medications prior to visit.    Allergies  Allergen Reactions   Keflex [Cephalexin] Shortness Of Breath    Hard time breathing   Robaxin [Methocarbamol] Nausea Only    Stomach pain . Could not eat   Hydrocodone-Acetaminophen Anxiety    Face flushed, high blood pressure, headache, shaking.   Hydroxyzine Other (See Comments) and Swelling    Numbness in lips   Prednisone & Diphenhydramine Other (See Comments)    Face  flushed, heart racing   Amlodipine Other (See Comments)    Bp worsened. Shaky. Insomnia.   Ciprofloxacin    Dextromethorphan Polistirex Er    Nitrofurantoin    Pravastatin Other (See Comments)    joint pains   Diphenhydramine Hcl Palpitations   Moxifloxacin Rash   Prednisone Other (See Comments) and Palpitations    "flushing"     Review of Systems     Objective:        01/11/2023   10:51 AM 01/05/2023    4:17 PM 12/31/2022   12:46 PM  Vitals with BMI  Height 5\' 5"  5\' 5"  5\' 6"   Weight 178 lbs 178 lbs 10 oz 182 lbs  BMI 29.62 24.23 53.61  Systolic  443 154  Diastolic  60 79  Pulse  78 73    No data found.   Physical Exam  Health Maintenance Due  Topic Date Due   Hepatitis C Screening  Never done   DTaP/Tdap/Td (1 - Tdap) Never done   Zoster Vaccines- Shingrix (1 of 2) Never done   COVID-19 Vaccine (4 - 2023-24 season) 12/21/2022    There are no preventive care reminders to display for this patient.   Lab Results  Component Value Date   TSH 2.920 08/10/2022   Lab Results  Component Value Date   WBC 5.4 12/31/2022   HGB 12.2 12/31/2022   HCT 38.5 12/31/2022   MCV 92.8 12/31/2022   PLT 233 12/31/2022   Lab Results  Component Value Date   NA 139 12/31/2022   K 4.1 12/31/2022   CHLORIDE 107 11/22/2017   CO2 22 12/31/2022   GLUCOSE 94 12/31/2022   BUN 15 12/31/2022   CREATININE 0.77 12/31/2022   BILITOT 0.4 10/12/2022   ALKPHOS 166 (H) 10/12/2022   AST 14 10/12/2022   ALT 14 10/12/2022   PROT 6.7 10/12/2022   ALBUMIN 3.9 10/12/2022   CALCIUM 8.8 (L) 12/31/2022   ANIONGAP 10 12/31/2022   EGFR 92 10/12/2022  Lab Results  Component Value Date   CHOL 154 10/12/2022   Lab Results  Component Value Date   HDL 82 10/12/2022   Lab Results  Component Value Date   LDLCALC 59 10/12/2022   Lab Results  Component Value Date   TRIG 67 10/12/2022   Lab Results  Component Value Date   CHOLHDL 1.9 10/12/2022   No results found for:  "HGBA1C"     Assessment & Plan:  No problem-specific Assessment & Plan notes found for this encounter.    No orders of the defined types were placed in this encounter.   No orders of the defined types were placed in this encounter.    Follow-up: No follow-ups on file.  An After Visit Summary was printed and given to the patient.  Rochel Brome, MD [facility] [phone]

## 2023-01-13 NOTE — Plan of Care (Signed)
Chronic Care Management Provider Comprehensive Care Plan    01/13/2023 Name: Victoria Pena MRN: [id number] DOB: Jun 20, 1951  Referral to Chronic Care Management (CCM) services was placed by Provider:  Neil Crouch, NP  on Date: 12-30-2022.  Chronic Condition 1: COPD Provider Assessment and Plan  Patient has once in a while SHORTNESS OF BREATH   albuterol puff as needed         Relevant Medications    albuterol (VENTOLIN HFA) 108 (90 Base) MCG/ACT inhaler     Expected Outcome/Goals Addressed This Visit (Provider CCM goals/Provider Assessment and plan   Goal: CCM (COPD) EXPECTED OUTCOME: MONITOR, SELF-MANAGE AND REDUCE SYMPTOMS OF COPD   Symptom Management Condition 1: Take all medications as prescribed Attend all scheduled provider appointments Call provider office for new concerns or questions  call the Suicide and Crisis Lifeline: 988 call the [place] National Suicide Prevention Lifeline: (715) 699-7043 or TTY: 249-888-2919 TTY 347-488-6239) to talk to a trained counselor call 1-800-273-TALK (toll free, 24 hour hotline) if experiencing a Mental Health or [place]  identify and remove indoor air pollutants limit outdoor activity during cold weather listen for public air quality announcements every day develop a rescue plan eliminate symptom triggers at home follow rescue plan if symptoms flare-up  Chronic Condition 2: HTN Provider Assessment and Plan  Not well controlled 140/88 first reading 130/90 second reading Patient is taking Losartan 100 mg OD,  Added HYDROCHLOROTHIAZIDE 12.5 mg  Nutrition: Stressed importance of moderation in sodium intake, saturated fat and cholesterol, caloric balance, sufficient intake of complex carbohydrates, fiber, calcium and iron.   Exercise: Stressed the importance of regular exercise.            Relevant Medications    losartan-hydrochlorothiazide (HYZAAR) 100-12.5 MG tablet     Expected Outcome/Goals Addressed This  Visit (Provider CCM goals/Provider Assessment and plan   Goal: CCM (HTN) EXPECTED OUTCOME: MONITOR, SELF-MANAGE AND REDUCE SYMPTOMS OF HTN  Symptom Management Condition 2: Take all medications as prescribed Attend all scheduled provider appointments Call provider office for new concerns or questions  call the Suicide and Crisis Lifeline: 988 call the [place] National Suicide Prevention Lifeline: 956-785-5344 or TTY: (226)081-9613 TTY 647-262-8615) to talk to a trained counselor call 1-800-273-TALK (toll free, 24 hour hotline) if experiencing a Mental Health or [place]  check blood pressure weekly learn about high blood pressure take blood pressure log to all doctor appointments call doctor for signs and symptoms of high blood pressure keep all doctor appointments take medications for blood pressure exactly as prescribed report new symptoms to your doctor  Chronic Condition 3: Stress, Anxiety, Depression  Provider Assessment and Plan  Depression and anxiety not under controlled Stopped Abilify Given Vraylar sample for 14 days Follow up in 2 weeks   Patient is taking Wellbutrin XL 150 mg Refer patient  to the [facility] for her other psychological issues         Relevant Medications    buPROPion (WELLBUTRIN XL) 150 MG 24 hr tablet    cariprazine (VRAYLAR) 1.5 MG capsule     Expected Outcome/Goals Addressed This Visit (Provider CCM goals/Provider Assessment and plan   Goal: CCM (Stress, Anxiety, Depression) EXPECTED OUTCOME: MONITOR, SELF-MANAGE AND REDUCE SYMPTOMS OF Stress, Anxiety, Depression   Symptom Management Condition 3: Take all medications as prescribed Attend all scheduled provider appointments Call provider office for new concerns or questions  call the Suicide and Crisis Lifeline: 988 call the [place] National Suicide Prevention Lifeline: 713 796 4270  or TTY: 7827825548 TTY (817)865-8460) to talk to a trained counselor call 1-800-273-TALK  (toll free, 24 hour hotline) if experiencing a Mental Health or [place] Crisis   Problem List Patient Active Problem List   Diagnosis Date Noted   Acute cystitis without hematuria 01/13/2023   Acute cystitis with hematuria 12/30/2022   Dysuria 12/27/2022   Other specified disorders of eye and adnexa 02/19/2022   History of Graves' disease 01/25/2022   Ankle pain 01/25/2022   Osteoporosis 08/03/2021   Daytime sleepiness 08/03/2021   Atherosclerosis of aorta ([place]) 07/08/2021   COPD (chronic obstructive pulmonary disease) ([facility])    Pericardial cyst 02/02/2021   BMI 30.0-30.9,adult 12/23/2020   Current mild episode of major depressive disorder ([place]) 03/24/2020   Gastroesophageal reflux disease without esophagitis 03/24/2020   Fibromyalgia 03/24/2020   Arthropathy of lumbar facet joint 04/11/2019   S/P lumbar spinal fusion 05/11/2018   Anxiety 12/27/2016   Benign essential hypertension 12/27/2016   Depression with anxiety 12/27/2016   Adenocarcinoma of right lung, stage 1 ([place]) 07/26/2016   Reactive airways dysfunction syndrome, unspecified asthma severity, uncomplicated ([place]) 65/68/1275   S/P lobectomy of lung 07/26/2015   Acquired spondylolisthesis 05/12/2015   Postoperative hypothyroidism 01/17/2015    Medication Management  Current Outpatient Medications:    albuterol (VENTOLIN HFA) 108 (90 Base) MCG/ACT inhaler, Inhale 2 puffs into the lungs every 6 (six) hours as needed for wheezing or shortness of breath., Disp: 8 g, Rfl: 0   ALPRAZolam (XANAX) 0.5 MG tablet, TAKE 1 TABLET(0.5 MG) BY MOUTH TWICE DAILY AS NEEDED FOR ANXIETY (Patient taking differently: Take 0.5 mg by mouth 2 (two) times daily as needed for anxiety. TAKE 1 TABLET(0.5 MG) BY MOUTH TWICE DAILY AS NEEDED FOR ANXIETY), Disp: 60 tablet, Rfl: 3   atorvastatin (LIPITOR) 40 MG tablet, TAKE 1 TABLET(40 MG) BY MOUTH DAILY (Patient taking differently: Take 40 mg by mouth daily.), Disp: 90 tablet, Rfl: 2    buPROPion (WELLBUTRIN XL) 150 MG 24 hr tablet, Take 1 tablet (150 mg total) by mouth daily., Disp: 90 tablet, Rfl: 0   Calcium Carb-Cholecalciferol (CALCIUM 500 + D3) 500-5 MG-MCG TABS, Take 3 tablets by mouth daily. (Patient taking differently: Take 1 tablet by mouth in the morning and at bedtime.), Disp: 100 tablet, Rfl: 5   calcium carbonate (TUMS - DOSED IN MG ELEMENTAL CALCIUM) 500 MG chewable tablet, Chew 1 tablet by mouth 2 (two) times daily as needed for indigestion or heartburn., Disp: , Rfl:    EPINEPHrine 0.3 mg/0.3 mL IJ SOAJ injection, Inject 0.3 mg into the muscle as needed for anaphylaxis., Disp: 1 each, Rfl: 1   escitalopram (LEXAPRO) 10 MG tablet, Take 1 tablet (10 mg total) by mouth daily., Disp: 30 tablet, Rfl: 0   fluticasone (FLONASE) 50 MCG/ACT nasal spray, Place 2 sprays into both nostrils daily. (Patient taking differently: Place 2 sprays into both nostrils at bedtime.), Disp: 16 g, Rfl: 6   levothyroxine (SYNTHROID) 125 MCG tablet, Take 1 tablet (125 mcg total) by mouth daily before breakfast. TAKE 1 TABLET(125 MCG) BY MOUTH DAILY Strength: 125 mcg, Disp: 90 tablet, Rfl: 0   losartan (COZAAR) 50 MG tablet, Take 1 tablet (50 mg total) by mouth daily., Disp: 90 tablet, Rfl: 0   metroNIDAZOLE (METROGEL) 0.75 % gel, Apply 1 application topically 2 (two) times daily. (Patient taking differently: Apply 1 application  topically 2 (two) times daily as needed (rosacea).), Disp: 45 g, Rfl: 3   Multiple Vitamin (MULTIVITAMIN WITH MINERALS) TABS  tablet, Take 1 tablet by mouth in the morning and at bedtime., Disp: , Rfl:    mupirocin ointment (BACTROBAN) 2 %, Apply 1 Application topically 2 (two) times daily., Disp: 22 g, Rfl: 0   Oxycodone HCl 10 MG TABS, Take 1 tablet (10 mg total) by mouth every 8 (eight) hours as needed. (Patient taking differently: Take 10 mg by mouth every 8 (eight) hours as needed (pain).), Disp: 90 tablet, Rfl: 0   pantoprazole (PROTONIX) 40 MG tablet, Take 1 tablet  (40 mg total) by mouth daily., Disp: 90 tablet, Rfl: 2   pregabalin (LYRICA) 150 MG capsule, TAKE 1 CAPSULE(150 MG) BY MOUTH TWICE DAILY (Patient taking differently: Take 150 mg by mouth 2 (two) times daily.), Disp: 60 capsule, Rfl: 3   Spacer/Aero-Holding Chambers DEVI, 1 each by Does not apply route daily., Disp: 1 each, Rfl: 0   sulfamethoxazole-trimethoprim (BACTRIM DS) 800-160 MG tablet, Take 1 tablet by mouth 2 (two) times daily., Disp: 60 tablet, Rfl: 0   Teriparatide, Recombinant, (FORTEO) 600 MCG/2.4ML SOPN, Inject 20 mcg into the skin daily. (Patient taking differently: Inject 20 mcg into the skin every 6 (six) months.), Disp: 2.4 mL, Rfl: 6   trolamine salicylate (ASPERCREME) 10 % cream, Apply 1 Application topically as needed for muscle pain., Disp: , Rfl:   Cognitive Assessment Identity Confirmed: : Name; DOB Cognitive Status: Normal   Functional Assessment Hearing Difficulty or Deaf: no Wear Glasses or Blind: yes Vision Management: wears glasses Concentrating, Remembering or Making Decisions Difficulty (CP): no Difficulty Communicating: no Difficulty Eating/Swallowing: no Walking or Climbing Stairs Difficulty: yes Walking or Climbing Stairs: ambulation difficulty, requires equipment Mobility Management: uses a cane, has a walker if needed Dressing/Bathing Difficulty: no Doing Errands Independently Difficulty (such as shopping) (CP): no Change in Functional Status Since Onset of Current Illness/Injury: no   Caregiver Assessment  Primary Source of Support/Comfort: child(ren) Name of Support/Comfort Primary Source: children: Cline Crock, [place] in Home: alone Family Caregiver if Needed: child(ren), adult Family Caregiver Names: Cline Crock, Donella Stade, her daughter currently at her home due to the patient having surgery but surgery cancelled until next week Primary Roles/Responsibilities: retired Expected Impact of Illness/Hospitalization: the patient has a UTI and  cannot have surgery tomorrow now, it has been rescheduled for next Friday Concerns About Impact on Relationships: has good support from her children   Planned Interventions  Evaluation of current treatment plan related to stress, anxiety, and depression and patient's adherence to plan as established by provider Advised patient to call the office for changes in stress, anxiety, and depression Provided education to patient re: working with the CCM team, support available with resources, ongoing outreaches for effective management of stress, anxiety, and depression Reviewed medications with patient and discussed compliance Reviewed scheduled/upcoming provider appointments including 02-15-2023 at 1120 am Care Guide referral for resources in the community to help with food resources and financial constraints. Has been provided with information.  Social Work referral for ongoing support and education related to effective management of depression, anxiety, stress Pharmacy referral for ongoing support and education of medication needs and management Discussed plans with patient for ongoing care management follow up and provided patient with direct contact information for care management team Advised patient to discuss changes in mood, anxiety, depression, or mental health needs with provider Screening for signs and symptoms of depression related to chronic disease state  Assessed social determinant of health barriers Evaluation of current treatment plan related to hypertension self management and patient's adherence  to plan as established by provider;   Provided education to patient re: stroke prevention, s/s of heart attack and stroke; Reviewed prescribed diet heart healthy diet Reviewed medications with patient and discussed importance of compliance;  Discussed plans with patient for ongoing care management follow up and provided patient with direct contact information for care management  team; Advised patient, providing education and rationale, to monitor blood pressure daily and record, calling PCP for findings outside established parameters;  Reviewed scheduled/upcoming provider appointments including: 02-15-2023 at 1120 am Advised patient to discuss changes in HTN or heart health with provider; Provided education on prescribed diet heart healthy ;  Discussed complications of poorly controlled blood pressure such as heart disease, stroke, circulatory complications, vision complications, kidney impairment, sexual dysfunction;  Screening for signs and symptoms of depression related to chronic disease state;  Assessed social determinant of health barriers;  Provided patient with basic written and verbal COPD education on self care/management/and exacerbation prevention Advised patient to track and manage COPD triggers Provided written and verbal instructions on pursed lip breathing and utilized returned demonstration as teach back Provided instruction about proper use of medications used for management of COPD including inhalers Advised patient to self assesses COPD action plan zone and make appointment with provider if in the yellow zone for 48 hours without improvement Advised patient to engage in light exercise as tolerated 3-5 days a week to aid in the the management of COPD Provided education about and advised patient to utilize infection prevention strategies to reduce risk of respiratory infection Discussed the importance of adequate rest and management of fatigue with COPD Screening for signs and symptoms of depression related to chronic disease state  Assessed social determinant of health barriers   Interaction and coordination with outside resources, practitioners, and providers See CCM Referral  Care Plan: Available in MyChart

## 2023-01-13 NOTE — Assessment & Plan Note (Signed)
Bactrim DS 1 tablet twice a day for 5 days, than continue daily until your next appointment on 02/15/2023.  Repeat UA in office the day before your surgery.

## 2023-01-13 NOTE — Progress Notes (Signed)
Subjective:  Patient ID: Victoria Pena, female    DOB: 10-Jan-1951  Age: 72 y.o. MRN: [id number]  Chief Complaint  Patient presents with   Urinary Frequency    HPI   Urinary symptoms  She reports recurrent urinary frequency. The current episode started a few days ago and is staying constant. Patient states symptoms are mild in intensity, occurring constantly. She  has been recently treated for similar symptoms.          12/30/2022    1:31 PM 12/29/2022    1:24 PM 12/29/2022    1:02 PM 12/16/2022    2:34 PM 12/08/2022   10:35 AM  Depression screen PHQ 2/9  Decreased Interest 2  0 2 2  Down, Depressed, Hopeless 0  1 2 2   PHQ - 2 Score 2  1 4 4   Altered sleeping 3  1 2 2   Tired, decreased energy 3  1 2 2   Change in appetite 0  0 1 2  Feeling bad or failure about yourself  1  0 1 1  Trouble concentrating 1  0 1 2  Moving slowly or fidgety/restless 0  0 1 1  Suicidal thoughts 0  0 1 0  PHQ-9 Score 10  3 13 14   Difficult doing work/chores Very difficult Somewhat difficult  Somewhat difficult Very difficult         04/23/2022    2:28 PM 04/27/2022    3:08 PM 09/06/2022   10:49 AM 09/06/2022   10:51 AM 01/13/2023    4:24 PM  Fall Risk  Falls in the past year? 1 1   0  Was there an injury with Fall? 0 0   0  Fall Risk Category Calculator 2 2   0  Fall Risk Category (Retired) Moderate Moderate     (RETIRED) Patient Fall Risk Level  Moderate fall risk Low fall risk Moderate fall risk   Patient at Risk for Falls Due to  Impaired balance/gait   No Fall Risks  Fall risk Follow up  Falls prevention discussed   Falls evaluation completed;Education provided;Falls prevention discussed      Review of Systems  Constitutional:  Negative for fatigue.  HENT:  Negative for congestion, ear pain and sore throat.   Respiratory:  Negative for cough and shortness of breath.   Cardiovascular:  Negative for chest pain.  Gastrointestinal:  Negative for abdominal pain, constipation, diarrhea,  nausea and vomiting.  Genitourinary:  Positive for frequency and urgency. Negative for dysuria.  Musculoskeletal:  Negative for arthralgias, back pain and myalgias.  Neurological:  Negative for dizziness and headaches.  Psychiatric/Behavioral:  Negative for agitation and sleep disturbance. The patient is not nervous/anxious.     Current Outpatient Medications on File Prior to Visit  Medication Sig Dispense Refill   albuterol (VENTOLIN HFA) 108 (90 Base) MCG/ACT inhaler Inhale 2 puffs into the lungs every 6 (six) hours as needed for wheezing or shortness of breath. 8 g 0   ALPRAZolam (XANAX) 0.5 MG tablet TAKE 1 TABLET(0.5 MG) BY MOUTH TWICE DAILY AS NEEDED FOR ANXIETY (Patient taking differently: Take 0.5 mg by mouth 2 (two) times daily as needed for anxiety. TAKE 1 TABLET(0.5 MG) BY MOUTH TWICE DAILY AS NEEDED FOR ANXIETY) 60 tablet 3   atorvastatin (LIPITOR) 40 MG tablet TAKE 1 TABLET(40 MG) BY MOUTH DAILY (Patient taking differently: Take 40 mg by mouth daily.) 90 tablet 2   buPROPion (WELLBUTRIN XL) 150 MG 24 hr tablet Take 1 tablet (  150 mg total) by mouth daily. 90 tablet 0   Calcium Carb-Cholecalciferol (CALCIUM 500 + D3) 500-5 MG-MCG TABS Take 3 tablets by mouth daily. (Patient taking differently: Take 1 tablet by mouth in the morning and at bedtime.) 100 tablet 5   calcium carbonate (TUMS - DOSED IN MG ELEMENTAL CALCIUM) 500 MG chewable tablet Chew 1 tablet by mouth 2 (two) times daily as needed for indigestion or heartburn.     EPINEPHrine 0.3 mg/0.3 mL IJ SOAJ injection Inject 0.3 mg into the muscle as needed for anaphylaxis. 1 each 1   escitalopram (LEXAPRO) 10 MG tablet Take 1 tablet (10 mg total) by mouth daily. 30 tablet 0   fluticasone (FLONASE) 50 MCG/ACT nasal spray Place 2 sprays into both nostrils daily. (Patient taking differently: Place 2 sprays into both nostrils at bedtime.) 16 g 6   levothyroxine (SYNTHROID) 125 MCG tablet Take 1 tablet (125 mcg total) by mouth daily before  breakfast. TAKE 1 TABLET(125 MCG) BY MOUTH DAILY Strength: 125 mcg 90 tablet 0   losartan (COZAAR) 50 MG tablet Take 1 tablet (50 mg total) by mouth daily. 90 tablet 0   metroNIDAZOLE (METROGEL) 0.75 % gel Apply 1 application topically 2 (two) times daily. (Patient taking differently: Apply 1 application  topically 2 (two) times daily as needed (rosacea).) 45 g 3   Multiple Vitamin (MULTIVITAMIN WITH MINERALS) TABS tablet Take 1 tablet by mouth in the morning and at bedtime.     mupirocin ointment (BACTROBAN) 2 % Apply 1 Application topically 2 (two) times daily. 22 g 0   Oxycodone HCl 10 MG TABS Take 1 tablet (10 mg total) by mouth every 8 (eight) hours as needed. (Patient taking differently: Take 10 mg by mouth every 8 (eight) hours as needed (pain).) 90 tablet 0   pantoprazole (PROTONIX) 40 MG tablet Take 1 tablet (40 mg total) by mouth daily. 90 tablet 2   pregabalin (LYRICA) 150 MG capsule TAKE 1 CAPSULE(150 MG) BY MOUTH TWICE DAILY (Patient taking differently: Take 150 mg by mouth 2 (two) times daily.) 60 capsule 3   Spacer/Aero-Holding Chambers DEVI 1 each by Does not apply route daily. 1 each 0   Teriparatide, Recombinant, (FORTEO) 600 MCG/2.4ML SOPN Inject 20 mcg into the skin daily. (Patient taking differently: Inject 20 mcg into the skin every 6 (six) months.) 2.4 mL 6   trolamine salicylate (ASPERCREME) 10 % cream Apply 1 Application topically as needed for muscle pain.     No current facility-administered medications on file prior to visit.   Past Medical History:  Diagnosis Date   Acquired spondylolisthesis 05/12/2015   Adenocarcinoma of right lung, stage 1 ([place]) 07/26/2016   Anxiety    Arthritis    Arthropathy of lumbar facet joint 04/11/2019   Formatting of this note might be different from the original. Added automatically from request for surgery [id number]   Atherosclerosis of aorta ([place]) 07/08/2021   Benign essential hypertension 12/27/2016   BMI 30.0-30.9,adult 12/23/2020    COPD (chronic obstructive pulmonary disease) ([place])    pt denies   Current mild episode of major depressive disorder ([place]) 03/24/2020   Daytime sleepiness 08/03/2021   Depression    Fibromyalgia 03/24/2020   Gastroesophageal reflux disease without esophagitis 03/24/2020   Graves disease 12/27/2016   Hypertension    Osteoporosis 08/03/2021   Pericardial cyst 02/02/2021   Postoperative hypothyroidism 01/17/2015   Reactive airways dysfunction syndrome, unspecified asthma severity, uncomplicated ([place]) 06/12/7627   S/P lobectomy of lung 07/26/2015  Formatting of this note might be different from the original. Right upper   S/P lumbar spinal fusion 05/11/2018   Thyroid disease    Toe ulcer, left, limited to breakdown of skin ([place]) 12/23/2020   Past Surgical History:  Procedure Laterality Date   ANKLE SURGERY     bilateral   APPENDECTOMY     BREAST BIOPSY Left 02/25/2020   BREAST BIOPSY Right 08/17/2018   BREAST EXCISIONAL BIOPSY Right    unsure when but marked with scar marker   CHOLECYSTECTOMY     EYE MUSCLE SURGERY Bilateral    4-5 years ago   LUMBAR FUSION  2019   L4, 5, 6   REPLACEMENT TOTAL KNEE Left    THYROIDECTOMY      Family History  Problem Relation Age of Onset   Cancer Mother    Cancer Father    Cancer Brother    Autism Grandson    Breast cancer Neg Hx    Social History   Socioeconomic History   Marital status: Widowed    Spouse name: Not on file   Number of children: Not on file   Years of education: Not on file   Highest education level: Not on file  Occupational History   Not on file  Tobacco Use   Smoking status: Former    Packs/day: 1.50    Years: 35.00    Total pack years: 52.50    Types: Cigarettes    Quit date: 07/27/1999    Years since quitting: 23.4   Smokeless tobacco: Never  Vaping Use   Vaping Use: Never used  Substance and Sexual Activity   Alcohol use: No   Drug use: No   Sexual activity: Not Currently  Other Topics Concern    Not on file  Social History Narrative   Not on file   Social Determinants of Health   Financial Resource Strain: High Risk (01/13/2023)   Overall Financial Resource Strain (CARDIA)    Difficulty of Paying Living Expenses: Hard  Food Insecurity: Food Insecurity Present (01/13/2023)   Hunger Vital Sign    Worried About Running Out of Food in the Last Year: Often true    Ran Out of Food in the Last Year: Often true  Transportation Needs: No Transportation Needs (01/13/2023)   PRAPARE - Hydrologist (Medical): No    Lack of Transportation (Non-Medical): No  Physical Activity: Insufficiently Active (12/08/2022)   Exercise Vital Sign    Days of Exercise per Week: 1 day    Minutes of Exercise per Session: 10 min  Stress: Stress Concern Present (01/13/2023)   [place]    Feeling of Stress : Rather much  Social Connections: Socially Isolated (01/13/2023)   Social Connection and Isolation Panel [NHANES]    Frequency of Communication with Friends and Family: More than three times a week    Frequency of Social Gatherings with Friends and Family: Twice a week    Attends Religious Services: Never    Marine scientist or Organizations: No    Attends Archivist Meetings: Never    Marital Status: Widowed    Objective:  BP 114/72 (BP Location: Left Arm, Patient Position: Sitting, Cuff Size: Normal)   Pulse 74   Temp (!) 97.1 F (36.2 C) (Temporal)   Ht 5\' 5"  (1.651 m)   Wt 178 lb (80.7 kg)   LMP  (LMP Unknown)   SpO2 100%  BMI 29.62 kg/m      01/13/2023    2:44 PM 01/11/2023   10:51 AM 01/05/2023    4:17 PM  BP/Weight  Systolic BP 627  035  Diastolic BP 72  60  Wt. (Lbs) 178 178 178.6  BMI 29.62 kg/m2 29.62 kg/m2 29.72 kg/m2    Physical Exam Vitals reviewed.  Cardiovascular:     Rate and Rhythm: Normal rate and regular rhythm.     Heart sounds: Normal heart sounds.   Pulmonary:     Effort: Pulmonary effort is normal.     Breath sounds: Normal breath sounds.  Abdominal:     Palpations: Abdomen is soft.     Tenderness: There is no abdominal tenderness.     Diabetic Foot Exam - Simple   No data filed      Lab Results  Component Value Date   WBC 5.4 12/31/2022   HGB 12.2 12/31/2022   HCT 38.5 12/31/2022   PLT 233 12/31/2022   GLUCOSE 94 12/31/2022   CHOL 154 10/12/2022   TRIG 67 10/12/2022   HDL 82 10/12/2022   LDLCALC 59 10/12/2022   ALT 14 10/12/2022   AST 14 10/12/2022   NA 139 12/31/2022   K 4.1 12/31/2022   CL 107 12/31/2022   CREATININE 0.77 12/31/2022   BUN 15 12/31/2022   CO2 22 12/31/2022   TSH 2.920 08/10/2022   INR 1.0 12/31/2022      Assessment & Plan:    Acute cystitis without hematuria Assessment & Plan: Bactrim DS 1 tablet twice a day for 5 days, than continue daily until your next appointment on 02/15/2023.  Repeat UA in office the day before your surgery.  Orders: -     Urine Culture -     cefTRIAXone Sodium -     POCT URINALYSIS DIP (CLINITEK)     Meds ordered this encounter  Medications   cefTRIAXone (ROCEPHIN) injection 1 g    Orders Placed This Encounter  Procedures   Urine Culture   POCT URINALYSIS DIP (CLINITEK)     Follow-up: Repeat UA in office the day before your surgery.  An After Visit Summary was printed and given to the patient.  Rochel Brome, MD [facility] [phone]

## 2023-01-13 NOTE — Patient Instructions (Signed)
Please call the care guide team at [phone] if you need to cancel or reschedule your appointment.   If you are experiencing a Mental Health or [place] or need someone to talk to, please call the Suicide and Crisis Lifeline: 988 call the [place] National Suicide Prevention Lifeline: (971)483-5925 or TTY: (872) 146-5149 TTY 7725033235) to talk to a trained counselor call 1-800-273-TALK (toll free, 24 hour hotline)   Following is a copy of the CCM Program Consent:  CCM service includes personalized support from designated clinical staff supervised by the physician, including individualized plan of care and coordination with other care providers 24/7 contact phone numbers for assistance for urgent and routine care needs. Service will only be billed when office clinical staff spend 20 minutes or more in a month to coordinate care. Only one practitioner may furnish and bill the service in a calendar month. The patient may stop CCM services at amy time (effective at the end of the month) by phone call to the office staff. The patient will be responsible for cost sharing (co-pay) or up to 20% of the service fee (after annual deductible is met)  Following is a copy of your full provider care plan:   Goals Addressed             This Visit's Progress    CCM Expected Outcome:  Monitor, Self-Manage and Reduce Symptoms of: Depression, Anxiety, Stress       Current Barriers:  Care Coordination needs related to resources in the community for help with mental health needs and community resources for financial support in a patient with anxiety, depression, and stress Chronic Disease Management support and education needs related to effective management of stress, anxiety, and depression  Planned Interventions: Evaluation of current treatment plan related to stress, anxiety, and depression and patient's adherence to plan as established by provider Advised patient to call the office for  changes in stress, anxiety, and depression Provided education to patient re: working with the CCM team, support available with resources, ongoing outreaches for effective management of stress, anxiety, and depression Reviewed medications with patient and discussed compliance Reviewed scheduled/upcoming provider appointments including 02-15-2023 at 1120 am Care Guide referral for resources in the community to help with food resources and financial constraints. Has been provided with information.  Social Work referral for ongoing support and education related to effective management of depression, anxiety, stress Pharmacy referral for ongoing support and education of medication needs and management Discussed plans with patient for ongoing care management follow up and provided patient with direct contact information for care management team Advised patient to discuss changes in mood, anxiety, depression, or mental health needs with provider Screening for signs and symptoms of depression related to chronic disease state  Assessed social determinant of health barriers  Symptom Management: Take medications as prescribed   Attend all scheduled provider appointments Call provider office for new concerns or questions  call the Suicide and Crisis Lifeline: 988 call the [place] National Suicide Prevention Lifeline: (713)059-3552 or TTY: (931)684-1329 TTY 661-087-3591) to talk to a trained counselor call 1-800-273-TALK (toll free, 24 hour hotline) if experiencing a Mental Health or [place]   Follow Up Plan: Telephone follow up appointment with care management team member scheduled for: 02-10-2023 at 345 pm        CCM Expected Outcome:  Monitor, Self-Manage, and Reduce Symptoms of Hypertension       Current Barriers:  Chronic Disease Management support and education needs related to effective  management of HTN  Planned Interventions: Evaluation of current treatment plan related to  hypertension self management and patient's adherence to plan as established by provider;   Provided education to patient re: stroke prevention, s/s of heart attack and stroke; Reviewed prescribed diet heart healthy diet Reviewed medications with patient and discussed importance of compliance;  Discussed plans with patient for ongoing care management follow up and provided patient with direct contact information for care management team; Advised patient, providing education and rationale, to monitor blood pressure daily and record, calling PCP for findings outside established parameters;  Reviewed scheduled/upcoming provider appointments including: 02-15-2023 at 1120 am Advised patient to discuss changes in HTN or heart health with provider; Provided education on prescribed diet heart healthy ;  Discussed complications of poorly controlled blood pressure such as heart disease, stroke, circulatory complications, vision complications, kidney impairment, sexual dysfunction;  Screening for signs and symptoms of depression related to chronic disease state;  Assessed social determinant of health barriers;   Symptom Management: Take medications as prescribed   Attend all scheduled provider appointments Call provider office for new concerns or questions  call the Suicide and Crisis Lifeline: 988 call the [place] National Suicide Prevention Lifeline: 718-499-3717 or TTY: (831)567-6050 TTY 8507798224) to talk to a trained counselor call 1-800-273-TALK (toll free, 24 hour hotline) if experiencing a Mental Health or [place]  check blood pressure weekly learn about high blood pressure call doctor for signs and symptoms of high blood pressure develop an action plan for high blood pressure keep all doctor appointments take medications for blood pressure exactly as prescribed report new symptoms to your doctor  Follow Up Plan: Telephone follow up appointment with care management team  member scheduled for: 02-10-2023 at 345 pm       CCM:  Maintain, Monitor and Self-Manage Symptoms of COPD       Current Barriers:  Chronic Disease Management support and education needs related to effective COPD  Planned Interventions: Provided patient with basic written and verbal COPD education on self care/management/and exacerbation prevention Advised patient to track and manage COPD triggers Provided written and verbal instructions on pursed lip breathing and utilized returned demonstration as teach back Provided instruction about proper use of medications used for management of COPD including inhalers Advised patient to self assesses COPD action plan zone and make appointment with provider if in the yellow zone for 48 hours without improvement Advised patient to engage in light exercise as tolerated 3-5 days a week to aid in the the management of COPD Provided education about and advised patient to utilize infection prevention strategies to reduce risk of respiratory infection Discussed the importance of adequate rest and management of fatigue with COPD Screening for signs and symptoms of depression related to chronic disease state  Assessed social determinant of health barriers  Symptom Management: Take medications as prescribed   Attend all scheduled provider appointments Call provider office for new concerns or questions  call the Suicide and Crisis Lifeline: 988 call the [place] National Suicide Prevention Lifeline: 347-238-4531 or TTY: (220)511-9326 TTY (615)104-1397) to talk to a trained counselor call 1-800-273-TALK (toll free, 24 hour hotline) if experiencing a Mental Health or [place]  identify and remove indoor air pollutants limit outdoor activity during cold weather listen for public air quality announcements every day develop a rescue plan eliminate symptom triggers at home follow rescue plan if symptoms flare-up  Follow Up Plan: Telephone follow up  appointment with care management team member scheduled  for: 02-10-2023 and 345 pm          Patient verbalizes understanding of instructions and care plan provided today and agrees to view in [place]. Active MyChart status and patient understanding of how to access instructions and care plan via MyChart confirmed with patient.     Telephone follow up appointment with care management team member scheduled for: 02-10-2023 at 345 pm

## 2023-01-13 NOTE — Progress Notes (Addendum)
Called patient to inform her of time change. She stated that she has a uti and has started antibiotics ordered by her PCP. She stated Dr. Ronnald Ramp office is aware and is going to reschedule her.

## 2023-01-14 ENCOUNTER — Telehealth: Payer: Self-pay

## 2023-01-14 ENCOUNTER — Encounter (HOSPITAL_COMMUNITY): Admission: RE | Payer: Self-pay | Source: Home / Self Care

## 2023-01-14 ENCOUNTER — Ambulatory Visit (HOSPITAL_COMMUNITY): Admission: RE | Admit: 2023-01-14 | Payer: Medicare HMO | Source: Home / Self Care | Admitting: Neurological Surgery

## 2023-01-14 ENCOUNTER — Encounter: Payer: Self-pay | Admitting: Family Medicine

## 2023-01-14 LAB — URINE CULTURE: Organism ID, Bacteria: NO GROWTH

## 2023-01-14 SURGERY — LAMINECTOMY WITH POSTERIOR LATERAL ARTHRODESIS LEVEL 2
Anesthesia: General | Site: Back

## 2023-01-14 NOTE — Telephone Encounter (Signed)
Patient notified of results through my chart.

## 2023-01-14 NOTE — Telephone Encounter (Signed)
Called patient and left message stating that we have received the [facility] for the patient from her patient assistance.

## 2023-01-14 NOTE — Telephone Encounter (Signed)
-----  Message from Park Liter, MD sent at 01/14/2023  8:59 AM EST ----- Stress test was normal

## 2023-01-17 ENCOUNTER — Telehealth: Payer: Self-pay

## 2023-01-17 ENCOUNTER — Other Ambulatory Visit: Payer: Self-pay | Admitting: Nurse Practitioner

## 2023-01-17 ENCOUNTER — Encounter: Payer: Medicare HMO | Admitting: *Deleted

## 2023-01-17 NOTE — Telephone Encounter (Signed)
The patient called back Friday and left a message questioning why she had a call. I called the patient back and left a message stating that her patient assistance was ready. I stated in the VM for the patient to call back if she has any questions, other then that she can come to the office to pick it up.

## 2023-01-17 NOTE — Telephone Encounter (Signed)
Patient came in today to pick up her patient assistance for Forteo and questioned why she was on this AND Prolia.  Prolia was given 11/24/22 after receiving it from [place] on 11/18/22.  I discussed this with Rekha and patient is NOT suppose to be on both Prolia and Forteo.  Rekha requested I ask the patient which she preferred and patient stated Prolia because it is only 6 months rather than daily.

## 2023-01-18 ENCOUNTER — Other Ambulatory Visit: Payer: Self-pay

## 2023-01-18 ENCOUNTER — Other Ambulatory Visit: Payer: Self-pay | Admitting: Nurse Practitioner

## 2023-01-18 DIAGNOSIS — U071 COVID-19: Secondary | ICD-10-CM

## 2023-01-19 ENCOUNTER — Telehealth: Payer: Self-pay

## 2023-01-19 DIAGNOSIS — F32A Depression, unspecified: Secondary | ICD-10-CM

## 2023-01-19 DIAGNOSIS — J449 Chronic obstructive pulmonary disease, unspecified: Secondary | ICD-10-CM

## 2023-01-19 DIAGNOSIS — I10 Essential (primary) hypertension: Secondary | ICD-10-CM

## 2023-01-19 NOTE — Progress Notes (Cosign Needed)
Patient approved to receive Forteo from [employer] patient assistance foundation for 2024 calendar year.   Pattricia Boss, [place] Pharmacist Assistant [phone]

## 2023-01-20 ENCOUNTER — Encounter (HOSPITAL_COMMUNITY): Payer: Self-pay | Admitting: Neurological Surgery

## 2023-01-20 ENCOUNTER — Other Ambulatory Visit: Payer: Self-pay

## 2023-01-20 DIAGNOSIS — J449 Chronic obstructive pulmonary disease, unspecified: Secondary | ICD-10-CM | POA: Diagnosis not present

## 2023-01-20 DIAGNOSIS — M4316 Spondylolisthesis, lumbar region: Secondary | ICD-10-CM | POA: Diagnosis not present

## 2023-01-20 DIAGNOSIS — Z85118 Personal history of other malignant neoplasm of bronchus and lung: Secondary | ICD-10-CM | POA: Diagnosis not present

## 2023-01-20 DIAGNOSIS — E039 Hypothyroidism, unspecified: Secondary | ICD-10-CM | POA: Diagnosis not present

## 2023-01-20 DIAGNOSIS — M48062 Spinal stenosis, lumbar region with neurogenic claudication: Secondary | ICD-10-CM | POA: Diagnosis not present

## 2023-01-20 DIAGNOSIS — Z87891 Personal history of nicotine dependence: Secondary | ICD-10-CM | POA: Diagnosis not present

## 2023-01-20 DIAGNOSIS — Z79899 Other long term (current) drug therapy: Secondary | ICD-10-CM | POA: Diagnosis not present

## 2023-01-20 DIAGNOSIS — I10 Essential (primary) hypertension: Secondary | ICD-10-CM | POA: Diagnosis not present

## 2023-01-20 NOTE — Progress Notes (Signed)
Anesthesia Chart Review:  Case: [id number] Date/Time: 01/21/23 0930   Procedure: Laminectomy  - L1-L2 - L2-L3 with non-instrumented posterior lateral fusion (Back)   Anesthesia type: General   Pre-op diagnosis: Radiculopathy   Location: [facility] OR ROOM 20 / [facility]   Surgeons: Eustace Moore, MD       DISCUSSION: See my initial Anesthesia APP note from Date of Service 12/31/2022. Surgery was delayed until 01/14/23 until she could be re-evaluated by cardiologist Dr. Agustin Cree for pericardial cyst. Surgery was again delayed  until 2/2/4 to allow time for treatment of UTI.   Patient had cardiology evaluation by Dr. Agustin Cree on 01/05/23. He recommended a preoperative echo to assess cyst and make sure it did not compress or create any hemodynamic compromise. He also recommended a stress test and added, "If echocardiogram and stress test comes normal then we can proceed with surgery without hesitation from cardiac standpoint review."    01/06/23 echo showed preserved LVEF and mild MR, no pericardial effusion. 01/11/23 nuclear stress test was normal, EF 74%, low risk.   Last lab results in [facility] include: Lab Results  Component Value Date   WBC 5.4 12/31/2022   HGB 12.2 12/31/2022   HCT 38.5 12/31/2022   PLT 233 12/31/2022   GLUCOSE 94 12/31/2022   CHOL 154 10/12/2022   TRIG 67 10/12/2022   HDL 82 10/12/2022   LDLCALC 59 10/12/2022   ALT 14 10/12/2022   AST 14 10/12/2022   NA 139 12/31/2022   K 4.1 12/31/2022   CL 107 12/31/2022   CREATININE 0.77 12/31/2022   BUN 15 12/31/2022   CO2 22 12/31/2022   TSH 2.920 08/10/2022   INR 1.0 12/31/2022    Anesthesia team to evaluate on the day of surgery.     Myra Gianotti, PA-C Surgical Short Stay/Anesthesiology [facility] Phone [phone] [facility] Phone [phone] 01/20/2023 12:35 PM

## 2023-01-20 NOTE — Progress Notes (Signed)
PCP - Neil Crouch, MD Cardiologist - Dr. Jenne Campus  Oncologist: Dr. Curt Bears   PPM/ICD - denies  Chest x-ray - n/a EKG - 12/31/22 Stress Test - 01/11/23 ECHO - 01/06/23  CPAP - n/a  Fasting Blood Sugar - n/a  Blood Thinner Instructions: n/a Patient was instructed: As of today, STOP taking any Aspirin (unless otherwise instructed by your surgeon) Aleve, Naproxen, Ibuprofen, Motrin, Advil, Goody's, BC's, all herbal medications, fish oil, and all vitamins.   ERAS Protcol - yes, until 06:45 o'clock  COVID TEST- n/a  Anesthesia review: yes  Patient verbally denies any shortness of breath, fever, cough and chest pain during phone call   -------------  SDW INSTRUCTIONS given:  Your procedure is scheduled on Friday, February 2nd, 2024.  Report to [facility] Main Entrance "A" at 07:15 A.M., and check in at the Admitting office.  Call this number if you have problems the morning of surgery:  [phone]   Remember:  Do not eat after midnight the night before your surgery  You may drink clear liquids until 06:45 the morning of your surgery.   Clear liquids allowed are: Water, Non-Citrus Juices (without pulp), Carbonated Beverages, Clear Tea, Black Coffee Only, and Gatorade    Take these medicines the morning of surgery with A SIP OF WATER:  atorvastatin (LIPITOR)  buPROPion (WELLBUTRIN XL)  escitalopram (LEXAPRO)  levothyroxine (SYNTHROID)  pantoprazole (PROTONIX)  pregabalin (LYRICA)  sulfamethoxazole-trimethoprim (BACTRIM DS)    If needed:  albuterol (VENTOLIN HFA)  ALPRAZolam (XANAX)  EPINEPHrine  oxyCODONE-acetaminophen (PERCOCET)    Please bring all inhalers with you the day of surgery.    The day of surgery:                     Do not wear jewelry, make up, or nail polish            Do not wear lotions, powders, perfumes, or deodorant.            Do not shave 48 hours prior to surgery.              Do not bring valuables to the hospital.             [facility] is not responsible for any belongings or valuables.  Do NOT Smoke (Tobacco/Vaping) 24 hours prior to your procedure If you use a CPAP at night, you may bring all equipment for your overnight stay.   Contacts, glasses, dentures or bridgework may not be worn into surgery.      For patients admitted to the hospital, discharge time will be determined by your treatment team.   Patients discharged the day of surgery will not be allowed to drive home, and someone needs to stay with them for 24 hours.    Special instructions:   [place]- Preparing For Surgery  Before surgery, you can play an important role. Because skin is not sterile, your skin needs to be as free of germs as possible. You can reduce the number of germs on your skin by washing with CHG (chlorahexidine gluconate) Soap before surgery.  CHG is an antiseptic cleaner which kills germs and bonds with the skin to continue killing germs even after washing.    Oral Hygiene is also important to reduce your risk of infection.  Remember - BRUSH YOUR TEETH THE MORNING OF SURGERY WITH YOUR REGULAR TOOTHPASTE  Please do not use if you have an allergy to CHG or antibacterial soaps. If  your skin becomes reddened/irritated stop using the CHG.  Do not shave (including legs and underarms) for at least 48 hours prior to first CHG shower. It is OK to shave your face.  Please follow these instructions carefully.   Shower the NIGHT BEFORE SURGERY and the MORNING OF SURGERY with DIAL Soap.   Pat yourself dry with a CLEAN TOWEL.  Wear CLEAN PAJAMAS to bed the night before surgery  Place CLEAN SHEETS on your bed the night of your first shower and DO NOT SLEEP WITH PETS.   Day of Surgery: Please shower morning of surgery  Wear Clean/Comfortable clothing the morning of surgery Do not apply any deodorants/lotions.   Remember to brush your teeth WITH YOUR REGULAR TOOTHPASTE.   Questions were answered. Patient verbalized  understanding of instructions.

## 2023-01-20 NOTE — Anesthesia Preprocedure Evaluation (Addendum)
Anesthesia Evaluation  Patient identified by MRN, date of birth, ID band Patient awake    Reviewed: Allergy & Precautions, H&P , NPO status , Patient's Chart, lab work & pertinent test results  Airway Mallampati: II  TM Distance: >3 FB Neck ROM: Full    Dental no notable dental hx. (+) Edentulous Upper, Edentulous Lower, Dental Advisory Given   Pulmonary COPD,  COPD inhaler, former smoker H/o lung CA S/p RUL   Pulmonary exam normal breath sounds clear to auscultation       Cardiovascular Exercise Tolerance: Good hypertension, Pt. on medications  Rhythm:Regular Rate:Normal  Nuclear stress test 01/11/23:   The study is normal. The study is low risk.   No ST deviation was noted.   Left ventricular function is normal. Nuclear stress EF: 74 %. The left ventricular ejection fraction is hyperdynamic (>65%). End diastolic cavity size is normal.   Prior study not available for comparison.     Echo 01/06/23: IMPRESSIONS   1. GLS -15.1. Left ventricular ejection fraction, by estimation, is 60 to  65%. The left ventricle has normal function. The left ventricle has no  regional wall motion abnormalities. Left ventricular diastolic parameters  are consistent with Grade I  diastolic dysfunction (impaired relaxation).   2. Right ventricular systolic function is normal. The right ventricular  size is normal. There is normal pulmonary artery systolic pressure.   3. The mitral valve is normal in structure. Mild mitral valve  regurgitation. No evidence of mitral stenosis.   4. The aortic valve is normal in structure. Aortic valve regurgitation is  not visualized. No aortic stenosis is present.   5. The inferior vena cava is normal in size with greater than 50%  respiratory variability, suggesting right atrial pressure of 3 mmHg.  - Pericardium: There is no evidence of pericardial effusion.    Neuro/Psych   Anxiety Depression    negative  neurological ROS     GI/Hepatic Neg liver ROS,GERD  ,,  Endo/Other  Hypothyroidism    Renal/GU negative Renal ROS  negative genitourinary   Musculoskeletal  (+) Arthritis , Osteoarthritis,  Fibromyalgia -  Abdominal   Peds  Hematology negative hematology ROS (+)   Anesthesia Other Findings   Reproductive/Obstetrics negative OB ROS                             Anesthesia Physical Anesthesia Plan  ASA: 3  Anesthesia Plan: General   Post-op Pain Management: Tylenol PO (pre-op)*   Induction: Intravenous  PONV Risk Score and Plan: 4 or greater and Ondansetron and Midazolam  Airway Management Planned: Oral ETT  Additional Equipment:   Intra-op Plan:   Post-operative Plan: Extubation in OR  Informed Consent: I have reviewed the patients History and Physical, chart, labs and discussed the procedure including the risks, benefits and alternatives for the proposed anesthesia with the patient or authorized representative who has indicated his/her understanding and acceptance.     Dental advisory given  Plan Discussed with: CRNA  Anesthesia Plan Comments: (PAT note written 01/20/2023 by Myra Gianotti, PA-C.  )        Anesthesia Quick Evaluation

## 2023-01-21 ENCOUNTER — Other Ambulatory Visit: Payer: Self-pay

## 2023-01-21 ENCOUNTER — Ambulatory Visit (HOSPITAL_COMMUNITY): Payer: Medicare HMO | Admitting: Vascular Surgery

## 2023-01-21 ENCOUNTER — Ambulatory Visit (HOSPITAL_BASED_OUTPATIENT_CLINIC_OR_DEPARTMENT_OTHER): Payer: Medicare HMO | Admitting: Vascular Surgery

## 2023-01-21 ENCOUNTER — Encounter (HOSPITAL_COMMUNITY): Payer: Self-pay | Admitting: Neurological Surgery

## 2023-01-21 ENCOUNTER — Ambulatory Visit (HOSPITAL_COMMUNITY): Payer: Medicare HMO

## 2023-01-21 ENCOUNTER — Observation Stay (HOSPITAL_COMMUNITY)
Admission: RE | Admit: 2023-01-21 | Discharge: 2023-01-22 | Disposition: A | Discharge status: Home / Self Care | Payer: Medicare HMO | Attending: Neurological Surgery | Admitting: Neurological Surgery

## 2023-01-21 ENCOUNTER — Encounter: Payer: Medicare HMO | Admitting: *Deleted

## 2023-01-21 ENCOUNTER — Encounter (HOSPITAL_COMMUNITY)
Admission: RE | Disposition: A | Discharge status: Home / Self Care | Payer: Self-pay | Source: Home / Self Care | Attending: Neurological Surgery

## 2023-01-21 DIAGNOSIS — Z87891 Personal history of nicotine dependence: Secondary | ICD-10-CM | POA: Insufficient documentation

## 2023-01-21 DIAGNOSIS — Z981 Arthrodesis status: Secondary | ICD-10-CM | POA: Diagnosis not present

## 2023-01-21 DIAGNOSIS — I10 Essential (primary) hypertension: Secondary | ICD-10-CM

## 2023-01-21 DIAGNOSIS — E039 Hypothyroidism, unspecified: Secondary | ICD-10-CM | POA: Diagnosis not present

## 2023-01-21 DIAGNOSIS — M4316 Spondylolisthesis, lumbar region: Secondary | ICD-10-CM | POA: Diagnosis not present

## 2023-01-21 DIAGNOSIS — Z79899 Other long term (current) drug therapy: Secondary | ICD-10-CM | POA: Insufficient documentation

## 2023-01-21 DIAGNOSIS — M4326 Fusion of spine, lumbar region: Secondary | ICD-10-CM | POA: Diagnosis not present

## 2023-01-21 DIAGNOSIS — Z85118 Personal history of other malignant neoplasm of bronchus and lung: Secondary | ICD-10-CM | POA: Diagnosis not present

## 2023-01-21 DIAGNOSIS — M48062 Spinal stenosis, lumbar region with neurogenic claudication: Secondary | ICD-10-CM

## 2023-01-21 DIAGNOSIS — J449 Chronic obstructive pulmonary disease, unspecified: Secondary | ICD-10-CM | POA: Insufficient documentation

## 2023-01-21 DIAGNOSIS — Z9889 Other specified postprocedural states: Secondary | ICD-10-CM

## 2023-01-21 DIAGNOSIS — F418 Other specified anxiety disorders: Secondary | ICD-10-CM | POA: Diagnosis not present

## 2023-01-21 HISTORY — PX: LAMINECTOMY WITH POSTERIOR LATERAL ARTHRODESIS LEVEL 2: SHX6336

## 2023-01-21 LAB — TYPE AND SCREEN
ABO/RH(D): O POS
Antibody Screen: NEGATIVE

## 2023-01-21 SURGERY — LAMINECTOMY WITH POSTERIOR LATERAL ARTHRODESIS LEVEL 2
Anesthesia: General | Site: Back

## 2023-01-21 MED ORDER — THROMBIN 5000 UNITS EX SOLR
CUTANEOUS | Status: AC
  Filled 2023-01-21: qty 5000

## 2023-01-21 MED ORDER — LIDOCAINE 2% (20 MG/ML) 5 ML SYRINGE
INTRAMUSCULAR | Status: DC | PRN
  Administered 2023-01-21: 60 mg via INTRAVENOUS

## 2023-01-21 MED ORDER — SODIUM CHLORIDE 0.9% FLUSH: 3.0000 mL | Freq: Two times a day (BID) | INTRAVENOUS | Status: DC

## 2023-01-21 MED ORDER — ORAL CARE MOUTH RINSE: 15.0000 mL | Freq: Once | OROMUCOSAL | Status: AC

## 2023-01-21 MED ORDER — PROPOFOL 10 MG/ML IV BOLUS
INTRAVENOUS | Status: DC | PRN
  Administered 2023-01-21: 100 mg via INTRAVENOUS

## 2023-01-21 MED ORDER — POTASSIUM CHLORIDE IN NACL 20-0.9 MEQ/L-% IV SOLN: INTRAVENOUS | Status: DC

## 2023-01-21 MED ORDER — ALBUTEROL SULFATE HFA 108 (90 BASE) MCG/ACT IN AERS: 2.0000 | INHALATION_SPRAY | Freq: Four times a day (QID) | RESPIRATORY_TRACT | Status: DC | PRN

## 2023-01-21 MED ORDER — ONDANSETRON HCL 4 MG/2ML IJ SOLN
INTRAMUSCULAR | Status: AC
  Filled 2023-01-21: qty 2

## 2023-01-21 MED ORDER — DEXAMETHASONE SODIUM PHOSPHATE 10 MG/ML IJ SOLN
INTRAMUSCULAR | Status: AC
  Filled 2023-01-21: qty 1

## 2023-01-21 MED ORDER — SUFENTANIL CITRATE 50 MCG/ML IV SOLN
INTRAVENOUS | Status: DC | PRN
  Administered 2023-01-21 (×2): 10 ug via INTRAVENOUS

## 2023-01-21 MED ORDER — ONDANSETRON HCL 4 MG PO TABS: 4.0000 mg | ORAL_TABLET | Freq: Four times a day (QID) | ORAL | Status: DC | PRN

## 2023-01-21 MED ORDER — PROPOFOL 10 MG/ML IV BOLUS
INTRAVENOUS | Status: AC
  Filled 2023-01-21: qty 20

## 2023-01-21 MED ORDER — SUGAMMADEX SODIUM 200 MG/2ML IV SOLN
INTRAVENOUS | Status: DC | PRN
  Administered 2023-01-21: 200 mg via INTRAVENOUS

## 2023-01-21 MED ORDER — PHENYLEPHRINE HCL-NACL 20-0.9 MG/250ML-% IV SOLN
INTRAVENOUS | Status: DC | PRN
  Administered 2023-01-21: 20 ug/min via INTRAVENOUS

## 2023-01-21 MED ORDER — THROMBIN 5000 UNITS EX SOLR: OROMUCOSAL | Status: DC | PRN

## 2023-01-21 MED ORDER — BUPROPION HCL ER (XL) 150 MG PO TB24
150.0000 mg | ORAL_TABLET | Freq: Every day | ORAL | Status: DC
  Administered 2023-01-22: 150 mg via ORAL
  Filled 2023-01-21: qty 1

## 2023-01-21 MED ORDER — LOSARTAN POTASSIUM 50 MG PO TABS: 50.0000 mg | ORAL_TABLET | Freq: Every day | ORAL | Status: DC

## 2023-01-21 MED ORDER — FENTANYL CITRATE (PF) 100 MCG/2ML IJ SOLN
INTRAMUSCULAR | Status: AC
  Filled 2023-01-21: qty 2

## 2023-01-21 MED ORDER — ROCURONIUM BROMIDE 10 MG/ML (PF) SYRINGE
PREFILLED_SYRINGE | INTRAVENOUS | Status: AC
  Filled 2023-01-21: qty 10

## 2023-01-21 MED ORDER — 0.9 % SODIUM CHLORIDE (POUR BTL) OPTIME
TOPICAL | Status: DC | PRN
  Administered 2023-01-21: 1000 mL

## 2023-01-21 MED ORDER — ROCURONIUM BROMIDE 10 MG/ML (PF) SYRINGE
PREFILLED_SYRINGE | INTRAVENOUS | Status: AC
  Filled 2023-01-21: qty 20

## 2023-01-21 MED ORDER — DEXAMETHASONE SODIUM PHOSPHATE 10 MG/ML IJ SOLN
INTRAMUSCULAR | Status: DC | PRN
  Administered 2023-01-21: 10 mg via INTRAVENOUS

## 2023-01-21 MED ORDER — ROCURONIUM BROMIDE 10 MG/ML (PF) SYRINGE
PREFILLED_SYRINGE | INTRAVENOUS | Status: DC | PRN
  Administered 2023-01-21: 50 mg via INTRAVENOUS
  Administered 2023-01-21: 10 mg via INTRAVENOUS

## 2023-01-21 MED ORDER — ESCITALOPRAM OXALATE 10 MG PO TABS
10.0000 mg | ORAL_TABLET | Freq: Every day | ORAL | Status: DC
  Administered 2023-01-21: 10 mg via ORAL
  Filled 2023-01-21: qty 1

## 2023-01-21 MED ORDER — MIDAZOLAM HCL 2 MG/2ML IJ SOLN
INTRAMUSCULAR | Status: DC | PRN
  Administered 2023-01-21: 2 mg via INTRAVENOUS

## 2023-01-21 MED ORDER — CELECOXIB 200 MG PO CAPS
200.0000 mg | ORAL_CAPSULE | Freq: Two times a day (BID) | ORAL | Status: DC
  Administered 2023-01-21 – 2023-01-22 (×3): 200 mg via ORAL
  Filled 2023-01-21 (×3): qty 1

## 2023-01-21 MED ORDER — SUFENTANIL CITRATE 50 MCG/ML IV SOLN
INTRAVENOUS | Status: AC
  Filled 2023-01-21: qty 1

## 2023-01-21 MED ORDER — SENNA 8.6 MG PO TABS
1.0000 | ORAL_TABLET | Freq: Two times a day (BID) | ORAL | Status: DC
  Administered 2023-01-21 – 2023-01-22 (×3): 8.6 mg via ORAL
  Filled 2023-01-21 (×3): qty 1

## 2023-01-21 MED ORDER — MORPHINE SULFATE (PF) 2 MG/ML IV SOLN: 2.0000 mg | INTRAVENOUS | Status: DC | PRN

## 2023-01-21 MED ORDER — ALBUTEROL SULFATE (2.5 MG/3ML) 0.083% IN NEBU: 2.5000 mg | INHALATION_SOLUTION | Freq: Four times a day (QID) | RESPIRATORY_TRACT | Status: DC | PRN

## 2023-01-21 MED ORDER — BUPIVACAINE HCL (PF) 0.25 % IJ SOLN
INTRAMUSCULAR | Status: DC | PRN
  Administered 2023-01-21: 5 mL

## 2023-01-21 MED ORDER — PANTOPRAZOLE SODIUM 40 MG PO TBEC
40.0000 mg | DELAYED_RELEASE_TABLET | Freq: Every day | ORAL | Status: DC
  Administered 2023-01-21 – 2023-01-22 (×2): 40 mg via ORAL
  Filled 2023-01-21 (×2): qty 1

## 2023-01-21 MED ORDER — SODIUM CHLORIDE 0.9% FLUSH: 3.0000 mL | INTRAVENOUS | Status: DC | PRN

## 2023-01-21 MED ORDER — ONDANSETRON HCL 4 MG/2ML IJ SOLN: 4.0000 mg | Freq: Four times a day (QID) | INTRAMUSCULAR | Status: DC | PRN

## 2023-01-21 MED ORDER — PHENYLEPHRINE 80 MCG/ML (10ML) SYRINGE FOR IV PUSH (FOR BLOOD PRESSURE SUPPORT)
PREFILLED_SYRINGE | INTRAVENOUS | Status: DC | PRN
  Administered 2023-01-21: 80 ug via INTRAVENOUS
  Administered 2023-01-21: 40 ug via INTRAVENOUS

## 2023-01-21 MED ORDER — OXYCODONE HCL 5 MG PO TABS
10.0000 mg | ORAL_TABLET | ORAL | Status: DC | PRN
  Administered 2023-01-21 – 2023-01-22 (×5): 10 mg via ORAL
  Filled 2023-01-21 (×5): qty 2

## 2023-01-21 MED ORDER — THROMBIN 20000 UNITS EX SOLR: OROMUCOSAL | Status: DC | PRN

## 2023-01-21 MED ORDER — CHLORHEXIDINE GLUCONATE CLOTH 2 % EX PADS: 6.0000 | MEDICATED_PAD | Freq: Once | CUTANEOUS | Status: DC

## 2023-01-21 MED ORDER — LACTATED RINGERS IV SOLN: INTRAVENOUS | Status: DC

## 2023-01-21 MED ORDER — THROMBIN 20000 UNITS EX SOLR
CUTANEOUS | Status: AC
  Filled 2023-01-21: qty 20000

## 2023-01-21 MED ORDER — LEVOTHYROXINE SODIUM 125 MCG PO TABS
125.0000 ug | ORAL_TABLET | Freq: Every day | ORAL | Status: DC
  Administered 2023-01-22: 125 ug via ORAL
  Filled 2023-01-21: qty 1

## 2023-01-21 MED ORDER — FLUTICASONE PROPIONATE 50 MCG/ACT NA SUSP
2.0000 | Freq: Every day | NASAL | Status: DC
  Administered 2023-01-21: 2 via NASAL
  Filled 2023-01-21: qty 16

## 2023-01-21 MED ORDER — VANCOMYCIN HCL IN DEXTROSE 1-5 GM/200ML-% IV SOLN
1000.0000 mg | Freq: Once | INTRAVENOUS | Status: AC
  Administered 2023-01-21: 1000 mg via INTRAVENOUS
  Filled 2023-01-21: qty 200

## 2023-01-21 MED ORDER — SODIUM CHLORIDE 0.9 % IV SOLN
250.0000 mL | INTRAVENOUS | Status: DC
  Administered 2023-01-21: 250 mL via INTRAVENOUS

## 2023-01-21 MED ORDER — CHLORHEXIDINE GLUCONATE 0.12 % MT SOLN
15.0000 mL | Freq: Once | OROMUCOSAL | Status: AC
  Administered 2023-01-21: 15 mL via OROMUCOSAL
  Filled 2023-01-21: qty 15

## 2023-01-21 MED ORDER — CALCIUM CARB-CHOLECALCIFEROL 500-5 MG-MCG PO TABS
3.0000 | ORAL_TABLET | Freq: Every day | ORAL | Status: DC
  Filled 2023-01-21: qty 3

## 2023-01-21 MED ORDER — LABETALOL HCL 5 MG/ML IV SOLN
INTRAVENOUS | Status: AC
  Filled 2023-01-21: qty 4

## 2023-01-21 MED ORDER — ACETAMINOPHEN 500 MG PO TABS
1000.0000 mg | ORAL_TABLET | Freq: Once | ORAL | Status: AC
  Administered 2023-01-21: 1000 mg via ORAL
  Filled 2023-01-21: qty 2

## 2023-01-21 MED ORDER — ACETAMINOPHEN 500 MG PO TABS
1000.0000 mg | ORAL_TABLET | Freq: Four times a day (QID) | ORAL | Status: AC
  Administered 2023-01-21 – 2023-01-22 (×4): 1000 mg via ORAL
  Filled 2023-01-21 (×4): qty 2

## 2023-01-21 MED ORDER — PREGABALIN 75 MG PO CAPS
150.0000 mg | ORAL_CAPSULE | Freq: Two times a day (BID) | ORAL | Status: DC
  Administered 2023-01-21 – 2023-01-22 (×2): 150 mg via ORAL
  Filled 2023-01-21 (×2): qty 2

## 2023-01-21 MED ORDER — LIDOCAINE 2% (20 MG/ML) 5 ML SYRINGE
INTRAMUSCULAR | Status: AC
  Filled 2023-01-21: qty 5

## 2023-01-21 MED ORDER — VANCOMYCIN HCL IN DEXTROSE 1-5 GM/200ML-% IV SOLN
1000.0000 mg | INTRAVENOUS | Status: AC
  Administered 2023-01-21: 1000 mg via INTRAVENOUS
  Filled 2023-01-21: qty 200

## 2023-01-21 MED ORDER — ONDANSETRON HCL 4 MG/2ML IJ SOLN
INTRAMUSCULAR | Status: DC | PRN
  Administered 2023-01-21: 4 mg via INTRAVENOUS

## 2023-01-21 MED ORDER — SODIUM CHLORIDE (PF) 0.9 % IJ SOLN
INTRAMUSCULAR | Status: AC
  Filled 2023-01-21: qty 10

## 2023-01-21 MED ORDER — BUPIVACAINE HCL (PF) 0.25 % IJ SOLN
INTRAMUSCULAR | Status: AC
  Filled 2023-01-21: qty 30

## 2023-01-21 MED ORDER — FENTANYL CITRATE (PF) 100 MCG/2ML IJ SOLN
25.0000 ug | INTRAMUSCULAR | Status: DC | PRN
  Administered 2023-01-21: 50 ug via INTRAVENOUS
  Administered 2023-01-21 (×2): 25 ug via INTRAVENOUS

## 2023-01-21 MED ORDER — ADULT MULTIVITAMIN W/MINERALS CH
1.0000 | ORAL_TABLET | Freq: Every day | ORAL | Status: DC
  Administered 2023-01-22: 1 via ORAL
  Filled 2023-01-21: qty 1

## 2023-01-21 MED ORDER — MIDAZOLAM HCL 2 MG/2ML IJ SOLN
INTRAMUSCULAR | Status: AC
  Filled 2023-01-21: qty 2

## 2023-01-21 MED ORDER — PHENYLEPHRINE 80 MCG/ML (10ML) SYRINGE FOR IV PUSH (FOR BLOOD PRESSURE SUPPORT)
PREFILLED_SYRINGE | INTRAVENOUS | Status: AC
  Filled 2023-01-21: qty 10

## 2023-01-21 SURGICAL SUPPLY — 48 items
BAG COUNTER SPONGE SURGICOUNT (BAG) ×1 IMPLANT
BASKET BONE COLLECTION (BASKET) IMPLANT
BENZOIN TINCTURE PRP APPL 2/3 (GAUZE/BANDAGES/DRESSINGS) ×1 IMPLANT
BLADE BONE MILL MEDIUM (MISCELLANEOUS) IMPLANT
BLADE CLIPPER SURG (BLADE) IMPLANT
BUR CARBIDE MATCH 3.0 (BURR) ×1 IMPLANT
CANISTER SUCT 3000ML PPV (MISCELLANEOUS) ×1 IMPLANT
CNTNR URN SCR LID CUP LEK RST (MISCELLANEOUS) ×1 IMPLANT
CONT SPEC 4OZ STRL OR WHT (MISCELLANEOUS) ×1
COVER BACK TABLE 60X90IN (DRAPES) ×1 IMPLANT
DRAPE C-ARM 42X72 X-RAY (DRAPES) IMPLANT
DRAPE LAPAROTOMY 100X72X124 (DRAPES) ×1 IMPLANT
DRAPE SURG 17X23 STRL (DRAPES) ×1 IMPLANT
DURAPREP 26ML APPLICATOR (WOUND CARE) ×1 IMPLANT
ELECT REM PT RETURN 9FT ADLT (ELECTROSURGICAL) ×1
ELECTRODE REM PT RTRN 9FT ADLT (ELECTROSURGICAL) ×1 IMPLANT
EVACUATOR 1/8 PVC DRAIN (DRAIN) IMPLANT
GAUZE 4X4 16PLY ~~LOC~~+RFID DBL (SPONGE) IMPLANT
GLOVE BIO SURGEON STRL SZ7 (GLOVE) IMPLANT
GLOVE BIO SURGEON STRL SZ8 (GLOVE) ×2 IMPLANT
GLOVE BIOGEL PI IND STRL 7.0 (GLOVE) IMPLANT
GOWN STRL REUS W/ TWL LRG LVL3 (GOWN DISPOSABLE) IMPLANT
GOWN STRL REUS W/ TWL XL LVL3 (GOWN DISPOSABLE) ×2 IMPLANT
GOWN STRL REUS W/TWL 2XL LVL3 (GOWN DISPOSABLE) IMPLANT
GOWN STRL REUS W/TWL LRG LVL3 (GOWN DISPOSABLE)
GOWN STRL REUS W/TWL XL LVL3 (GOWN DISPOSABLE) ×2
GRAFT BONE PROTEIOS LRG 5CC (Orthopedic Implant) IMPLANT
HEMOSTAT POWDER KIT SURGIFOAM (HEMOSTASIS) IMPLANT
KIT BASIN OR (CUSTOM PROCEDURE TRAY) ×1 IMPLANT
KIT TURNOVER KIT B (KITS) ×1 IMPLANT
MATRIX STRIP NEOCORE 12C (Putty) IMPLANT
NDL HYPO 25X1 1.5 SAFETY (NEEDLE) ×1 IMPLANT
NEEDLE HYPO 25X1 1.5 SAFETY (NEEDLE) ×1 IMPLANT
NS IRRIG 1000ML POUR BTL (IV SOLUTION) ×1 IMPLANT
PACK LAMINECTOMY NEURO (CUSTOM PROCEDURE TRAY) ×1 IMPLANT
PAD ARMBOARD 7.5X6 YLW CONV (MISCELLANEOUS) ×3 IMPLANT
SPONGE SURGIFOAM ABS GEL 100 (HEMOSTASIS) ×1 IMPLANT
SPONGE T-LAP 4X18 ~~LOC~~+RFID (SPONGE) IMPLANT
STRIP CLOSURE SKIN 1/2X4 (GAUZE/BANDAGES/DRESSINGS) ×2 IMPLANT
STRIP MATRIX NEOCORE 12CC (Putty) ×1 IMPLANT
SUT VIC AB 0 CT1 18XCR BRD8 (SUTURE) ×1 IMPLANT
SUT VIC AB 0 CT1 8-18 (SUTURE) ×1
SUT VIC AB 2-0 CP2 18 (SUTURE) ×1 IMPLANT
SUT VIC AB 3-0 SH 8-18 (SUTURE) ×2 IMPLANT
TOWEL GREEN STERILE (TOWEL DISPOSABLE) ×1 IMPLANT
TOWEL GREEN STERILE FF (TOWEL DISPOSABLE) ×1 IMPLANT
TRAY FOLEY MTR SLVR 16FR STAT (SET/KITS/TRAYS/PACK) IMPLANT
WATER STERILE IRR 1000ML POUR (IV SOLUTION) ×1 IMPLANT

## 2023-01-21 NOTE — Anesthesia Postprocedure Evaluation (Signed)
Anesthesia Post Note  Patient: Victoria Pena [facility]  Procedure(s) Performed: Laminectomy  - L1-L2 - L2-L3 with non-instrumented posterior lateral fusion (Back)     Patient location during evaluation: PACU Anesthesia Type: General Level of consciousness: awake and alert Pain management: pain level controlled Vital Signs Assessment: post-procedure vital signs reviewed and stable Respiratory status: spontaneous breathing, nonlabored ventilation and respiratory function stable Cardiovascular status: blood pressure returned to baseline and stable Postop Assessment: no apparent nausea or vomiting Anesthetic complications: no  No notable events documented.  Last Vitals:  Vitals:   01/21/23 1220 01/21/23 1235  BP: 92/75 107/67  Pulse: 73 73  Resp: 15 19  Temp:  36.6 C  SpO2: 95% 93%    Last Pain:  Vitals:   01/21/23 1235  TempSrc:   PainSc: 5                  Jayr Lupercio,W. EDMOND

## 2023-01-21 NOTE — Op Note (Signed)
01/21/2023  11:27 AM  PATIENT:  Victoria Pena  72 y.o. female  PRE-OPERATIVE DIAGNOSIS: Retrolisthesis L1-2, spinal stenosis L1-2, adjacent level stenosis L2-3, back pain with claudication  POST-OPERATIVE DIAGNOSIS:  same  PROCEDURE: Decompressive lumbar laminectomy medial facetectomy and foraminotomies bilaterally L1-2 and L2-3, followed by intertransverse arthrodesis L1-2, L2- 3 utilizing locally harvested morselized autologous bone graft and morselized allograft  SURGEON:  Sherley Bounds, MD  ASSISTANTS: Dr. Ellene Route  ANESTHESIA:   General  EBL: 75 ml  Total I/O In: 200 [IV Piggyback:200] Out: 75 [Blood:75]  BLOOD ADMINISTERED: none  DRAINS: None  SPECIMEN:  none  INDICATION FOR PROCEDURE: This patient presented with back pain with leg pain with numbness and tingling and weakness in the legs. Imaging showed adjacent level stenosis at L2-3 with moderate stenosis L1-2 above previous successful L3-L5 fusion. The patient tried conservative measures without relief. Pain was debilitating. Recommended decompressive laminectomy L1-2 and L2-3 followed by intertransverse arthrodesis without instrumentation because of her severe osteoporosis and small pedicles. Patient understood the risks, benefits, and alternatives and potential outcomes and wished to proceed.  PROCEDURE DETAILS: The patient was taken to the operating room and after induction of adequate generalized endotracheal anesthesia, the patient was rolled into the prone position on the Wilson frame and all pressure points were padded. The lumbar region was cleaned and then prepped with DuraPrep and draped in the usual sterile fashion. 5 cc of local anesthesia was injected and then a dorsal midline incision was made and carried down to the lumbo sacral fascia. The fascia was opened and the paraspinous musculature was taken down in a subperiosteal fashion to expose L1-2 and L2-3 bilaterally. Intraoperative x-ray confirmed my level, and  then I moved the spinous processes of L1 and L2, and used a combination of the high-speed drill and the Kerrison punches to perform a hemilaminectomy, medial facetectomy, and foraminotomy at L1-2 and L2-3 bilaterally, all bone was saved in the mucus trap for later arthrodesis. The underlying yellow ligament was opened and removed in a piecemeal fashion to expose the underlying dura and exiting nerve root. I undercut the lateral recess and dissected down until I was medial to and distal to the pedicle at both levels.  There was severe stenosis at L2-3 with significant compression of the thecal sac.  The dura relaxed nicely as we continue to decompress the central canal and the lateral recesses.  I then palpated with a coronary dilator along the nerve root and into the foramen to assure adequate decompression. I felt no more compression of the nerve roots. I irrigated with saline solution containing bacitracin.  We exposed the transverse processes of L1, L2 and L3 bilaterally and decorticated these and then placed our mixture of local autograft and morselized allograft over L1-L2 and L3 transverse processes to perform arthrodesis bilaterally. achieved hemostasis with bipolar cautery, lined the dura with Gelfoam, and then closed the fascia with 0 Vicryl. I closed the subcutaneous tissues with 2-0 Vicryl and the subcuticular tissues with 3-0 Vicryl. The skin was then closed with benzoin and Steri-Strips. The drapes were removed, a sterile dressing was applied.  My nurse practitioner was involved in the exposure, safe retraction of the neural elements, the disc work and the closure.  Dr. Ellene Route helped with the decompression.  The patient was awakened from general anesthesia and transferred to the recovery room in stable condition. at the end of the procedure all sponge, needle and instrument counts were correct.    PLAN OF CARE: Admit  for overnight observation  PATIENT DISPOSITION:  PACU - hemodynamically stable.    Delay start of Pharmacological VTE agent (>24hrs) due to surgical blood loss or risk of bleeding:  yes

## 2023-01-21 NOTE — H&P (Signed)
Subjective: Patient is a 72 y.o. female admitted for lumbar stenosis. Onset of symptoms was several months ago, gradually worsening since that time.  The pain is rated severe, and is located at the across the lower back and radiates to legs. The pain is described as aching and occurs all day. The symptoms have been progressive. Symptoms are exacerbated by exercise, standing, and walking for more than a few minutes. MRI or CT showed stenosis L1-2 L2-3 above previous L3-5 fusion  Past Medical History:  Diagnosis Date   Acquired spondylolisthesis 05/12/2015   Adenocarcinoma of right lung, stage 1 ([place]) 07/26/2016   Anxiety    Arthritis    Arthropathy of lumbar facet joint 04/11/2019   Formatting of this note might be different from the original. Added automatically from request for surgery [id number]   Atherosclerosis of aorta ([place]) 07/08/2021   Benign essential hypertension 12/27/2016   BMI 30.0-30.9,adult 12/23/2020   COPD (chronic obstructive pulmonary disease) ([place])    pt denies   Current mild episode of major depressive disorder ([place]) 03/24/2020   Daytime sleepiness 08/03/2021   Depression    Fibromyalgia 03/24/2020   Gastroesophageal reflux disease without esophagitis 03/24/2020   Graves disease 12/27/2016   Hypertension    Osteoporosis 08/03/2021   Pericardial cyst 02/02/2021   Postoperative hypothyroidism 01/17/2015   Reactive airways dysfunction syndrome, unspecified asthma severity, uncomplicated ([place]) 28/78/6767   S/P lobectomy of lung 07/26/2015   Formatting of this note might be different from the original. Right upper   S/P lumbar spinal fusion 05/11/2018   Thyroid disease    Toe ulcer, left, limited to breakdown of skin ([place]) 12/23/2020    Past Surgical History:  Procedure Laterality Date   ANKLE SURGERY     bilateral   APPENDECTOMY     BREAST BIOPSY Left 02/25/2020   BREAST BIOPSY Right 08/17/2018   BREAST EXCISIONAL BIOPSY Right    unsure when but marked with scar  marker   CHOLECYSTECTOMY     EYE MUSCLE SURGERY Bilateral    4-5 years ago   LUMBAR FUSION  2019   L4, 5, 6   REPLACEMENT TOTAL KNEE Left    THYROIDECTOMY      Prior to Admission medications   Medication Sig Start Date End Date Taking? Authorizing Provider  ALPRAZolam (XANAX) 0.5 MG tablet TAKE 1 TABLET(0.5 MG) BY MOUTH TWICE DAILY AS NEEDED FOR ANXIETY Patient taking differently: Take 0.5 mg by mouth 2 (two) times daily as needed for anxiety. TAKE 1 TABLET(0.5 MG) BY MOUTH TWICE DAILY AS NEEDED FOR ANXIETY 11/20/22  Yes Lillard Anes, MD  atorvastatin (LIPITOR) 40 MG tablet TAKE 1 TABLET(40 MG) BY MOUTH DAILY Patient taking differently: Take 40 mg by mouth daily. 10/05/22  Yes Lillard Anes, MD  buPROPion (WELLBUTRIN XL) 150 MG 24 hr tablet Take 1 tablet (150 mg total) by mouth daily. 12/03/22  Yes Aryal, Palma Holter, FNP  Calcium Carb-Cholecalciferol (CALCIUM 500 + D3) 500-5 MG-MCG TABS Take 3 tablets by mouth daily. Patient taking differently: Take 1 tablet by mouth in the morning and at bedtime. 11/03/22  Yes Lillard Anes, MD  calcium carbonate (TUMS - DOSED IN MG ELEMENTAL CALCIUM) 500 MG chewable tablet Chew 1 tablet by mouth 2 (two) times daily as needed for indigestion or heartburn.   Yes [provider]  escitalopram (LEXAPRO) 10 MG tablet Take 1 tablet (10 mg total) by mouth daily. 01/06/23  Yes Aryal, Palma Holter, FNP  fluticasone (FLONASE) 50 MCG/ACT nasal spray Place  2 sprays into both nostrils daily. Patient taking differently: Place 2 sprays into both nostrils at bedtime. 12/31/21  Yes Rip Harbour, NP  levothyroxine (SYNTHROID) 125 MCG tablet Take 1 tablet (125 mcg total) by mouth daily before breakfast. TAKE 1 TABLET(125 MCG) BY MOUTH DAILY Strength: 125 mcg 01/06/23  Yes Aryal, Palma Holter, FNP  losartan (COZAAR) 50 MG tablet Take 1 tablet (50 mg total) by mouth daily. 12/09/22  Yes Neil Crouch, FNP  Multiple Vitamin (MULTIVITAMIN WITH MINERALS) TABS  tablet Take 1 tablet by mouth in the morning and at bedtime.   Yes [provider]  mupirocin ointment (BACTROBAN) 2 % Apply 1 Application topically 2 (two) times daily. 11/24/22  Yes Lillard Anes, MD  Oxycodone HCl 10 MG TABS Take 1 tablet (10 mg total) by mouth every 8 (eight) hours as needed. Patient taking differently: Take 10 mg by mouth every 8 (eight) hours as needed (pain). 01/03/23  Yes Cox, Kirsten, MD  pantoprazole (PROTONIX) 40 MG tablet Take 1 tablet (40 mg total) by mouth daily. 10/12/22  Yes Lillard Anes, MD  pregabalin (LYRICA) 150 MG capsule TAKE 1 CAPSULE(150 MG) BY MOUTH TWICE DAILY Patient taking differently: Take 150 mg by mouth 2 (two) times daily. 11/30/22  Yes Lillard Anes, MD  sulfamethoxazole-trimethoprim (BACTRIM DS) 800-160 MG tablet Take 1 tablet by mouth 2 (two) times daily. 01/13/23  Yes Cox, Kirsten, MD  trolamine salicylate (ASPERCREME) 10 % cream Apply 1 Application topically as needed for muscle pain.   Yes [provider]  albuterol (VENTOLIN HFA) 108 (90 Base) MCG/ACT inhaler Inhale 2 puffs into the lungs every 6 (six) hours as needed for wheezing or shortness of breath. 12/03/22   Neil Crouch, FNP  denosumab (PROLIA) 60 MG/ML SOSY injection Inject 60 mg into the skin every 6 (six) months. 11/24/22   [provider]  EPINEPHrine 0.3 mg/0.3 mL IJ SOAJ injection Inject 0.3 mg into the muscle as needed for anaphylaxis. 12/27/22   Rip Harbour, NP  metroNIDAZOLE (METROGEL) 0.75 % gel Apply 1 application topically 2 (two) times daily. Patient taking differently: Apply 1 application  topically 2 (two) times daily as needed (rosacea). 11/16/21   Lillard Anes, MD  Spacer/Aero-Holding Josiah Lobo DEVI 1 each by Does not apply route daily. 12/16/22   Neil Crouch, FNP   Allergies  Allergen Reactions   Keflex [Cephalexin] Shortness Of Breath    Hard time breathing   Robaxin [Methocarbamol] Nausea Only     Stomach pain . Could not eat   Hydrocodone-Acetaminophen Anxiety    Face flushed, high blood pressure, headache, shaking.   Hydroxyzine Other (See Comments) and Swelling    Numbness in lips   Prednisone & Diphenhydramine Other (See Comments)    Face flushed, heart racing   Amlodipine Other (See Comments)    Bp worsened. Shaky. Insomnia.   Ciprofloxacin    Dextromethorphan Polistirex Er    Nitrofurantoin    Pravastatin Other (See Comments)    joint pains   Diphenhydramine Hcl Palpitations   Moxifloxacin Rash   Prednisone Other (See Comments) and Palpitations    "flushing"     Social History   Tobacco Use   Smoking status: Former    Packs/day: 1.50    Years: 35.00    Total pack years: 52.50    Types: Cigarettes    Quit date: 07/27/1999    Years since quitting: 23.5   Smokeless tobacco: Never  Substance Use Topics   Alcohol use:  No    Family History  Problem Relation Age of Onset   Cancer Mother    Cancer Father    Cancer Brother    Autism Grandson    Breast cancer Neg Hx      Review of Systems  Positive ROS: neg  All other systems have been reviewed and were otherwise negative with the exception of those mentioned in the HPI and as above.  Objective: Vital signs in last 24 hours: Temp:  [98.8 F (37.1 C)] 98.8 F (37.1 C) (02/02 0713) Pulse Rate:  [81] 81 (02/02 0713) Resp:  [20] 20 (02/02 0713) BP: (117)/(81) 117/81 (02/02 0713) SpO2:  [98 %] 98 % (02/02 0713) Weight:  [80.7 kg] 80.7 kg (02/02 0713)  General Appearance: Alert, cooperative, no distress, appears stated age Head: Normocephalic, without obvious abnormality, atraumatic Eyes: PERRL, conjunctiva/corneas clear, EOM's intact    Neck: Supple, symmetrical, trachea midline Back: Symmetric, no curvature, ROM normal, no CVA tenderness Lungs:  respirations unlabored Heart: Regular rate and rhythm Abdomen: Soft, non-tender Extremities: Extremities normal, atraumatic, no cyanosis or edema Pulses: 2+  and symmetric all extremities Skin: Skin color, texture, turgor normal, no rashes or lesions  NEUROLOGIC:   Mental status: Alert and oriented x4,  no aphasia, good attention span, fund of knowledge, and memory Motor Exam - grossly normal Sensory Exam - grossly normal Reflexes: 1= Coordination - grossly normal Gait - grossly normal Balance - grossly normal Cranial Nerves: I: smell Not tested  II: visual acuity  OS: nl    OD: nl  II: visual fields Full to confrontation  II: pupils Equal, round, reactive to light  III,VII: ptosis None  III,IV,VI: extraocular muscles  Full ROM  V: mastication Normal  V: facial light touch sensation  Normal  V,VII: corneal reflex  Present  VII: facial muscle function - upper  Normal  VII: facial muscle function - lower Normal  VIII: hearing Not tested  IX: soft palate elevation  Normal  IX,X: gag reflex Present  XI: trapezius strength  5/5  XI: sternocleidomastoid strength 5/5  XI: neck flexion strength  5/5  XII: tongue strength  Normal    Data Review Lab Results  Component Value Date   WBC 5.4 12/31/2022   HGB 12.2 12/31/2022   HCT 38.5 12/31/2022   MCV 92.8 12/31/2022   PLT 233 12/31/2022   Lab Results  Component Value Date   NA 139 12/31/2022   K 4.1 12/31/2022   CL 107 12/31/2022   CO2 22 12/31/2022   BUN 15 12/31/2022   CREATININE 0.77 12/31/2022   GLUCOSE 94 12/31/2022   Lab Results  Component Value Date   INR 1.0 12/31/2022    Assessment/Plan:  Estimated body mass index is 29.62 kg/m as calculated from the following:   Height as of this encounter: 5\' 5"  (1.651 m).   Weight as of this encounter: 80.7 kg. Patient admitted for lumbar laminectomy with non-instrumented fusion L1-3. Patient has failed a reasonable attempt at conservative therapy.  I explained the condition and procedure to the patient and answered any questions.  Patient wishes to proceed with procedure as planned. Understands risks/ benefits and typical  outcomes of procedure.   Eustace Moore 01/21/2023 8:53 AM

## 2023-01-21 NOTE — Anesthesia Procedure Notes (Signed)
Procedure Name: Intubation Date/Time: 01/21/2023 9:37 AM  Performed by: Anastasio Auerbach, CRNAPre-anesthesia Checklist: Patient identified, Emergency Drugs available, Suction available and Patient being monitored Patient Re-evaluated:Patient Re-evaluated prior to induction Oxygen Delivery Method: Circle system utilized Preoxygenation: Pre-oxygenation with 100% oxygen Induction Type: IV induction Ventilation: Mask ventilation without difficulty Laryngoscope Size: Mac and 3 Grade View: Grade I Tube type: Oral Number of attempts: 1 Airway Equipment and Method: Stylet and Oral airway Placement Confirmation: ETT inserted through vocal cords under direct vision, positive ETCO2 and breath sounds checked- equal and bilateral Secured at: 21 cm Tube secured with: Tape Dental Injury: Teeth and Oropharynx as per pre-operative assessment

## 2023-01-21 NOTE — Transfer of Care (Signed)
Immediate Anesthesia Transfer of Care Note  Patient: Victoria Pena [facility]  Procedure(s) Performed: Laminectomy  - L1-L2 - L2-L3 with non-instrumented posterior lateral fusion (Back)  Patient Location: PACU  Anesthesia Type:General  Level of Consciousness: awake, oriented, and drowsy  Airway & Oxygen Therapy: Patient Spontanous Breathing and Patient connected to nasal cannula oxygen  Post-op Assessment: Report given to RN and Post -op Vital signs reviewed and stable  Post vital signs: Reviewed and stable  Last Vitals:  Vitals Value Taken Time  BP 113/77 01/21/23 1132  Temp    Pulse 81 01/21/23 1134  Resp 12 01/21/23 1134  SpO2 97 % 01/21/23 1134  Vitals shown include unvalidated device data.  Last Pain:  Vitals:   01/21/23 0727  TempSrc:   PainSc: 3       Patients Stated Pain Goal: 1 (57/32/25 6720)  Complications: No notable events documented.

## 2023-01-22 ENCOUNTER — Encounter (HOSPITAL_COMMUNITY): Payer: Self-pay | Admitting: Neurological Surgery

## 2023-01-22 DIAGNOSIS — I10 Essential (primary) hypertension: Secondary | ICD-10-CM | POA: Diagnosis not present

## 2023-01-22 DIAGNOSIS — M4316 Spondylolisthesis, lumbar region: Secondary | ICD-10-CM | POA: Diagnosis not present

## 2023-01-22 DIAGNOSIS — E039 Hypothyroidism, unspecified: Secondary | ICD-10-CM | POA: Diagnosis not present

## 2023-01-22 DIAGNOSIS — Z79899 Other long term (current) drug therapy: Secondary | ICD-10-CM | POA: Diagnosis not present

## 2023-01-22 DIAGNOSIS — Z85118 Personal history of other malignant neoplasm of bronchus and lung: Secondary | ICD-10-CM | POA: Diagnosis not present

## 2023-01-22 DIAGNOSIS — J449 Chronic obstructive pulmonary disease, unspecified: Secondary | ICD-10-CM | POA: Diagnosis not present

## 2023-01-22 DIAGNOSIS — M48062 Spinal stenosis, lumbar region with neurogenic claudication: Secondary | ICD-10-CM | POA: Diagnosis not present

## 2023-01-22 DIAGNOSIS — Z87891 Personal history of nicotine dependence: Secondary | ICD-10-CM | POA: Diagnosis not present

## 2023-01-22 MED ORDER — CYCLOBENZAPRINE HCL 10 MG PO TABS: 10.0000 mg | ORAL_TABLET | Freq: Three times a day (TID) | ORAL | 3 refills | Status: DC | PRN

## 2023-01-22 MED ORDER — OXYCODONE HCL 10 MG PO TABS: 10.0000 mg | ORAL_TABLET | ORAL | 0 refills | Status: DC | PRN

## 2023-01-22 NOTE — Plan of Care (Signed)
  Problem: Education: Goal: Ability to verbalize activity precautions or restrictions will improve Outcome: Completed/Met Goal: Knowledge of the prescribed therapeutic regimen will improve Outcome: Completed/Met Goal: Understanding of discharge needs will improve Outcome: Completed/Met   Problem: Activity: Goal: Ability to avoid complications of mobility impairment will improve Outcome: Completed/Met Goal: Ability to tolerate increased activity will improve Outcome: Completed/Met Goal: Will remain free from falls Outcome: Completed/Met   Problem: Bowel/Gastric: Goal: Gastrointestinal status for postoperative course will improve Outcome: Completed/Met   Problem: Clinical Measurements: Goal: Ability to maintain clinical measurements within normal limits will improve Outcome: Completed/Met Goal: Postoperative complications will be avoided or minimized Outcome: Completed/Met Goal: Diagnostic test results will improve Outcome: Completed/Met   Problem: Pain Management: Goal: Pain level will decrease Outcome: Completed/Met   Problem: Skin Integrity: Goal: Will show signs of wound healing Outcome: Completed/Met   Problem: Health Behavior/Discharge Planning: Goal: Identification of resources available to assist in meeting health care needs will improve Outcome: Completed/Met   Problem: Bladder/Genitourinary: Goal: Urinary functional status for postoperative course will improve Outcome: Completed/Met Patient alert and oriented, void, VSS, ambulate. Surgical site clean and dry.d/c instructions explain and given all questions answered.  Patient d/c home per order.

## 2023-01-22 NOTE — Discharge Summary (Signed)
Physician Discharge Summary  Patient ID: Victoria Pena MRN: [id number] DOB/AGE: [birth year] 72 y.o.  Admit date: 01/21/2023 Discharge date: 01/22/2023  Admission Diagnoses: Lumbar spinal stenosis L1-2 and L2-3.  Neurogenic claudication.  Lumbar radiculopathy  Discharge Diagnoses: Lumbar spinal stenosis L1-2 and L2-3.  Neurogenic claudication.  Lumbar radiculopathy.  Retrolisthesis L1-2, L2-3. Principal Problem:   S/P lumbar laminectomy   Discharged Condition: good  Hospital Course: Patient was admitted to undergo surgical decompression arthrodesis using a noninstrumented posterolateral technique.  She tolerated surgery well.  Consults: None  Significant Diagnostic Studies: None  Treatments: See op note  Discharge Exam: Blood pressure 125/76, pulse 77, temperature 97.9 F (36.6 C), temperature source Oral, resp. rate 18, height 5\' 5"  (1.651 m), weight 80.7 kg, SpO2 96 %. Incision is clean and dry Station and gait are intact.  Disposition: Discharge disposition: 01-Home or Self Care       Discharge Instructions     Call MD for:  redness, tenderness, or signs of infection (pain, swelling, redness, odor or green/yellow discharge around incision site)   Complete by: As directed    Call MD for:  severe uncontrolled pain   Complete by: As directed    Call MD for:  temperature >100.4   Complete by: As directed    Diet - low sodium heart healthy   Complete by: As directed    Increase activity slowly   Complete by: As directed       Allergies as of 01/22/2023       Reactions   Keflex [cephalexin] Shortness Of Breath   Hard time breathing   Robaxin [methocarbamol] Nausea Only   Stomach pain . Could not eat   Hydrocodone-acetaminophen Anxiety   Face flushed, high blood pressure, headache, shaking.   Hydroxyzine Other (See Comments), Swelling   Numbness in lips   Prednisone & Diphenhydramine Other (See Comments)   Face flushed, heart racing   Amlodipine Other (See  Comments)   Bp worsened. Shaky. Insomnia.   Ciprofloxacin    Dextromethorphan Polistirex Er    Nitrofurantoin    Pravastatin Other (See Comments)   joint pains   Diphenhydramine Hcl Palpitations   Moxifloxacin Rash   Prednisone Other (See Comments), Palpitations   "flushing"        Medication List     TAKE these medications    albuterol 108 (90 Base) MCG/ACT inhaler Commonly known as: VENTOLIN HFA Inhale 2 puffs into the lungs every 6 (six) hours as needed for wheezing or shortness of breath.   ALPRAZolam 0.5 MG tablet Commonly known as: XANAX TAKE 1 TABLET(0.5 MG) BY MOUTH TWICE DAILY AS NEEDED FOR ANXIETY What changed: See the new instructions.   atorvastatin 40 MG tablet Commonly known as: LIPITOR TAKE 1 TABLET(40 MG) BY MOUTH DAILY What changed: See the new instructions.   buPROPion 150 MG 24 hr tablet Commonly known as: Wellbutrin XL Take 1 tablet (150 mg total) by mouth daily.   Calcium Carb-Cholecalciferol 500-5 MG-MCG Tabs Commonly known as: Calcium 500 + D3 Take 3 tablets by mouth daily. What changed:  how much to take when to take this   calcium carbonate 500 MG chewable tablet Commonly known as: TUMS - dosed in mg elemental calcium Chew 1 tablet by mouth 2 (two) times daily as needed for indigestion or heartburn.   cyclobenzaprine 10 MG tablet Commonly known as: FLEXERIL Take 1 tablet (10 mg total) by mouth 3 (three) times daily as needed for muscle spasms.   denosumab 60  MG/ML Sosy injection Commonly known as: PROLIA Inject 60 mg into the skin every 6 (six) months.   EPINEPHrine 0.3 mg/0.3 mL Soaj injection Commonly known as: EPI-PEN Inject 0.3 mg into the muscle as needed for anaphylaxis.   escitalopram 10 MG tablet Commonly known as: Lexapro Take 1 tablet (10 mg total) by mouth daily.   fluticasone 50 MCG/ACT nasal spray Commonly known as: FLONASE Place 2 sprays into both nostrils daily. What changed: when to take this    levothyroxine 125 MCG tablet Commonly known as: SYNTHROID Take 1 tablet (125 mcg total) by mouth daily before breakfast. TAKE 1 TABLET(125 MCG) BY MOUTH DAILY Strength: 125 mcg   losartan 50 MG tablet Commonly known as: COZAAR Take 1 tablet (50 mg total) by mouth daily.   metroNIDAZOLE 0.75 % gel Commonly known as: METROGEL Apply 1 application topically 2 (two) times daily. What changed:  when to take this reasons to take this   multivitamin with minerals Tabs tablet Take 1 tablet by mouth in the morning and at bedtime.   mupirocin ointment 2 % Commonly known as: BACTROBAN Apply 1 Application topically 2 (two) times daily.   Oxycodone HCl 10 MG Tabs Take 1 tablet (10 mg total) by mouth every 3 (three) hours as needed for severe pain ((score 7 to 10)). What changed:  when to take this reasons to take this   pantoprazole 40 MG tablet Commonly known as: PROTONIX Take 1 tablet (40 mg total) by mouth daily.   pregabalin 150 MG capsule Commonly known as: LYRICA TAKE 1 CAPSULE(150 MG) BY MOUTH TWICE DAILY What changed: See the new instructions.   Spacer/Aero-Holding Dorise Bullion 1 each by Does not apply route daily.   sulfamethoxazole-trimethoprim 800-160 MG tablet Commonly known as: BACTRIM DS Take 1 tablet by mouth 2 (two) times daily.   trolamine salicylate 10 % cream Commonly known as: ASPERCREME Apply 1 Application topically as needed for muscle pain.        Follow-up Information     Eustace Moore, MD. Call.   Specialty: Neurosurgery Why: As needed, If symptoms worsen Contact information: [address] [place] [address] [place] [address] [phone]                 Signed: Earleen Newport 01/22/2023, 9:32 AM

## 2023-01-22 NOTE — Evaluation (Signed)
Occupational Therapy Evaluation Patient Details Name: Victoria Pena MRN: [id number] DOB: 10/03/1971 Today's Date: 01/22/2023   History of Present Illness Pt is a 72 y/o female admitted for L1-2, L2-3 laminectomy on 2/2 in setting of lumbar stenosis. PMH: prior L3-5 fusion, anxiety, HTN, COPD, fibromyalgia, Graves disease, osteoporosis, s/p lobectomy of lung.   Clinical Impression   PTA, pt lived alone and was independent; daughter recently staying with her and plans to stay several more weeks. Upon eval, pt requires up to min guard for ADLs. Pt educated and demonstrating use of compensatory techniques for bed mobility, LB dressing, grooming, toileting, tub/shower transfers, stair training, and car transfers within precautions. Pt requires significantly increased time for LB dressing, but feel she likely did at baseline as well. Using cane during session; highly recommended RW upon return home until balance improves and pt receptive. Pt with diplopia at baseline; offered to tape glasses for comfort but she declined. Recommending discharge home with daughter to assist as needed. Thank you for this order.      Recommendations for follow up therapy are one component of a multi-disciplinary discharge planning process, led by the attending physician.  Recommendations may be updated based on patient status, additional functional criteria and insurance authorization.   Follow Up Recommendations  No OT follow up     Assistance Recommended at Discharge Intermittent Supervision/Assistance  Patient can return home with the following A little help with walking and/or transfers;A little help with bathing/dressing/bathroom;Assistance with cooking/housework;Assist for transportation;Help with stairs or ramp for entrance    Functional Status Assessment  Patient has had a recent decline in their functional status and demonstrates the ability to make significant improvements in function in a reasonable and  predictable amount of time.  Equipment Recommendations  BSC/3in1    Recommendations for Other Services       Precautions / Restrictions Precautions Precautions: Back Precaution Booklet Issued: Yes (comment) Precaution Comments: all precautions reviewed within the context of ADL Required Braces or Orthoses:  (no brace needed orders) Restrictions Weight Bearing Restrictions: No      Mobility Bed Mobility Overal bed mobility: Modified Independent             General bed mobility comments: significantly incresaed time    Transfers Overall transfer level: Needs assistance Equipment used: None Transfers: Sit to/from Stand Sit to Stand: Min guard           General transfer comment: for safety      Balance Overall balance assessment: Mild deficits observed, not formally tested                                         ADL either performed or assessed with clinical judgement   ADL Overall ADL's : Needs assistance/impaired Eating/Feeding: Independent   Grooming: Wash/dry face;Oral care;Supervision/safety;Standing   Upper Body Bathing: Set up;Sitting   Lower Body Bathing: Min guard;Sit to/from stand Lower Body Bathing Details (indicate cue type and reason): educated RE AE Upper Body Dressing : Set up;Sitting   Lower Body Dressing: Min guard;Sit to/from stand;Cueing for back precautions;With adaptive equipment;Adhering to back precautions   Toilet Transfer: Min guard;Ambulation;Regular Glass blower/designer Details (indicate cue type and reason): for safety Toileting- Clothing Manipulation and Hygiene: Supervision/safety;Sitting/lateral lean Toileting - Clothing Manipulation Details (indicate cue type and reason): for anterior pericare. Educated on compensatory techniques for pericare Tub/ Shower Transfer: Tub transfer;Min guard;Shower seat;Ambulation  Tub/Shower Transfer Details (indicate cue type and reason): min guard A for safety Functional  mobility during ADLs: Min guard;Cane General ADL Comments: encouraged pt to use RW for first several days upon return home to optimize safety     Vision Baseline Vision/History:  (hx of graves disease with diplopia during L gaze and looking down. Compensates well) Ability to See in Adequate Light: 2 Moderately impaired Patient Visual Report: Diplopia;No change from baseline Additional Comments: diplopia at baseline. Her MD has offered surgery to correct but pt does not want surgery. Attempted to educate regarding occlusive taping for comfort, but pt declining; compensates well with head turns/etc to decr experience of diplopia. Has had it since 1998 with onset of graves disease     Perception     Praxis      Pertinent Vitals/Pain Pain Assessment Pain Assessment: Faces Faces Pain Scale: Hurts a little bit Pain Location: operative site Pain Descriptors / Indicators: Operative site guarding Pain Intervention(s): Limited activity within patient's tolerance, Monitored during session     Hand Dominance Right   Extremity/Trunk Assessment Upper Extremity Assessment Upper Extremity Assessment: Generalized weakness (BUE tremor that pt reports she gets when she is tired or has not been able to sleep)   Lower Extremity Assessment Lower Extremity Assessment: Generalized weakness;LLE deficits/detail LLE Deficits / Details: decr strength and mobility as compared to R   Cervical / Trunk Assessment Cervical / Trunk Assessment: Back Surgery;Kyphotic (Scoliosis?)   Communication Communication Communication: No difficulties   Cognition Arousal/Alertness: Awake/alert Behavior During Therapy: WFL for tasks assessed/performed Overall Cognitive Status: Within Functional Limits for tasks assessed                                       General Comments  VSS    Exercises     Shoulder Instructions      Home Living Family/patient expects to be discharged to:: Private  residence Living Arrangements: Alone Available Help at Discharge: Family;Available 24 hours/day (Daughter has recently been staying after her own surgery and planning to stay while pt recovers) Type of Home: Mobile home Home Access: Stairs to enter [employer] of Steps: 4 Entrance Stairs-Rails: Right;Left;Can reach both Home Layout: One level     Bathroom Shower/Tub: Teacher, early years/pre: Standard     Home Equipment: Air cabin crew (4 wheels);Rolling Walker (2 wheels);Cane - single point          Prior Functioning/Environment Prior Level of Function : Independent/Modified Independent;Driving             Mobility Comments: cane ADLs Comments: independent        OT Problem List: Decreased strength;Decreased activity tolerance;Impaired balance (sitting and/or standing);Decreased knowledge of use of DME or AE;Decreased knowledge of precautions;Pain;Impaired vision/perception      OT Treatment/Interventions:      OT Goals(Current goals can be found in the care plan section) Acute Rehab OT Goals Patient Stated Goal: go home OT Goal Formulation: With patient  OT Frequency:      Co-evaluation              AM-PAC OT "6 Clicks" Daily Activity     Outcome Measure Help from another person eating meals?: None Help from another person taking care of personal grooming?: A Little Help from another person toileting, which includes using toliet, bedpan, or urinal?: A Little Help from another person bathing (including washing, rinsing, drying)?:  A Little Help from another person to put on and taking off regular upper body clothing?: A Little Help from another person to put on and taking off regular lower body clothing?: A Little 6 Click Score: 19   End of Session Equipment Utilized During Treatment: Gait belt;Other (comment) (cane) Nurse Communication: Mobility status  Activity Tolerance: Patient tolerated treatment well Patient left: in  bed;with call bell/phone within reach  OT Visit Diagnosis: Unsteadiness on feet (R26.81);Other abnormalities of gait and mobility (R26.89);Muscle weakness (generalized) (M62.81);Low vision, both eyes (H54.2);Pain Pain - part of body:  (operative site)                Time: 7494-4967 OT Time Calculation (min): 50 min Charges:  OT General Charges $OT Visit: 1 Visit OT Evaluation $OT Eval Low Complexity: 1 Low OT Treatments $Self Care/Home Management : 23-37 mins  Elder Cyphers, OTR/L [facility] Acute Rehabilitation Office: [phone]   Magnus Ivan 01/22/2023, 9:53 AM

## 2023-01-22 NOTE — Discharge Instructions (Signed)
Wound Care Keep incision covered and dry for three days.  Do not put any creams, lotions, or ointments on incision. Leave steri-strips on back.  They will fall off by themselves. Activity Walk each and every day, increasing distance each day. No lifting greater than 8 lbs.  Avoid excessive back bending No driving for 2 weeks; may ride as a passenger locally.  Diet Resume your normal diet.   Call Your Doctor If Any of These Occur Redness, drainage, or swelling at the wound.  Temperature greater than 101 degrees. Severe pain not relieved by pain medication. Incision starts to come apart. Follow Up Appt Call  [phone])  for problems.  If you have any hardware placed in your spine, you will need an x-ray before your appointment.

## 2023-01-28 ENCOUNTER — Telehealth: Payer: Self-pay

## 2023-01-28 NOTE — Progress Notes (Signed)
Care Management & Coordination Services Pharmacy Team  Reason for Encounter: General adherence update   Contacted patient for general health update and medication adherence call.  Spoke with patient on 01/28/2023    What concerns do you have about your medications? Patient stated no  The patient denies side effects with their medications.   How often do you forget or accidentally miss a dose? Never  Do you use a pillbox? Yes  Are you having any problems getting your medications from your pharmacy? No  Has the cost of your medications been a concern? No [employer] patient assistance approved  Since last visit with PharmD, no interventions have been made.   The patient has not had an ED visit since last contact.   The patient denies problems with their health. Patient stated still recovering from surgery  Patient denies concerns or questions for [place] Constable, PharmD at this time.   Counseled patient on: [place] job taking medications  Chart Updates: Recent office visits:  01-13-2023 Vanita Ingles, RN (CCM).  01-13-2023 Rochel Brome, MD. Abnormal UA. Rocepin given.  12-30-2022 Neil Crouch, FNP. Abnormal UA.  12-29-2022 mobile mammogram completed.  12-29-2022 Deirdre Peer, LCSW (CCM)  Recent consult visits:  01-21-2023 Eustace Moore, MD (Neurosurgery). S/P lumbar laminectomy procedure completed.  01-05-2023 Park Liter, MD (Cardiology). Orders placed for echocardiogram and myocardial perfusion.  12-31-2022 Eustace Moore, MD (Neurosurgery). Pre admission testing  Hospital visits:  None in previous 6 months  Medications: Outpatient Encounter Medications as of 01/28/2023  Medication Sig   albuterol (VENTOLIN HFA) 108 (90 Base) MCG/ACT inhaler Inhale 2 puffs into the lungs every 6 (six) hours as needed for wheezing or shortness of breath.   ALPRAZolam (XANAX) 0.5 MG tablet TAKE 1 TABLET(0.5 MG) BY MOUTH TWICE DAILY AS NEEDED FOR ANXIETY (Patient taking  differently: Take 0.5 mg by mouth 2 (two) times daily as needed for anxiety. TAKE 1 TABLET(0.5 MG) BY MOUTH TWICE DAILY AS NEEDED FOR ANXIETY)   atorvastatin (LIPITOR) 40 MG tablet TAKE 1 TABLET(40 MG) BY MOUTH DAILY (Patient taking differently: Take 40 mg by mouth daily.)   buPROPion (WELLBUTRIN XL) 150 MG 24 hr tablet Take 1 tablet (150 mg total) by mouth daily.   Calcium Carb-Cholecalciferol (CALCIUM 500 + D3) 500-5 MG-MCG TABS Take 3 tablets by mouth daily. (Patient taking differently: Take 1 tablet by mouth in the morning and at bedtime.)   calcium carbonate (TUMS - DOSED IN MG ELEMENTAL CALCIUM) 500 MG chewable tablet Chew 1 tablet by mouth 2 (two) times daily as needed for indigestion or heartburn.   cyclobenzaprine (FLEXERIL) 10 MG tablet Take 1 tablet (10 mg total) by mouth 3 (three) times daily as needed for muscle spasms.   denosumab (PROLIA) 60 MG/ML SOSY injection Inject 60 mg into the skin every 6 (six) months.   EPINEPHrine 0.3 mg/0.3 mL IJ SOAJ injection Inject 0.3 mg into the muscle as needed for anaphylaxis.   escitalopram (LEXAPRO) 10 MG tablet Take 1 tablet (10 mg total) by mouth daily.   fluticasone (FLONASE) 50 MCG/ACT nasal spray Place 2 sprays into both nostrils daily. (Patient taking differently: Place 2 sprays into both nostrils at bedtime.)   levothyroxine (SYNTHROID) 125 MCG tablet Take 1 tablet (125 mcg total) by mouth daily before breakfast. TAKE 1 TABLET(125 MCG) BY MOUTH DAILY Strength: 125 mcg   losartan (COZAAR) 50 MG tablet Take 1 tablet (50 mg total) by mouth daily.   metroNIDAZOLE (METROGEL) 0.75 % gel Apply 1  application topically 2 (two) times daily. (Patient taking differently: Apply 1 application  topically 2 (two) times daily as needed (rosacea).)   Multiple Vitamin (MULTIVITAMIN WITH MINERALS) TABS tablet Take 1 tablet by mouth in the morning and at bedtime.   mupirocin ointment (BACTROBAN) 2 % Apply 1 Application topically 2 (two) times daily.   oxyCODONE 10  MG TABS Take 1 tablet (10 mg total) by mouth every 3 (three) hours as needed for severe pain ((score 7 to 10)).   pantoprazole (PROTONIX) 40 MG tablet Take 1 tablet (40 mg total) by mouth daily.   pregabalin (LYRICA) 150 MG capsule TAKE 1 CAPSULE(150 MG) BY MOUTH TWICE DAILY (Patient taking differently: Take 150 mg by mouth 2 (two) times daily.)   Spacer/Aero-Holding Chambers DEVI 1 each by Does not apply route daily.   sulfamethoxazole-trimethoprim (BACTRIM DS) 800-160 MG tablet Take 1 tablet by mouth 2 (two) times daily.   trolamine salicylate (ASPERCREME) 10 % cream Apply 1 Application topically as needed for muscle pain.   No facility-administered encounter medications on file as of 01/28/2023.    Recent vitals BP Readings from Last 3 Encounters:  01/22/23 125/76  01/13/23 114/72  01/05/23 118/60   Pulse Readings from Last 3 Encounters:  01/22/23 77  01/13/23 74  01/05/23 78   Wt Readings from Last 3 Encounters:  01/21/23 178 lb (80.7 kg)  01/13/23 178 lb (80.7 kg)  01/11/23 178 lb (80.7 kg)   BMI Readings from Last 3 Encounters:  01/21/23 29.62 kg/m  01/13/23 29.62 kg/m  01/11/23 29.62 kg/m    Recent lab results    Component Value Date/Time   NA 139 12/31/2022 1322   NA 143 10/12/2022 1433   NA 140 11/22/2017 1451   K 4.1 12/31/2022 1322   K 3.9 11/22/2017 1451   CL 107 12/31/2022 1322   CO2 22 12/31/2022 1322   CO2 26 11/22/2017 1451   GLUCOSE 94 12/31/2022 1322   GLUCOSE 91 11/22/2017 1451   BUN 15 12/31/2022 1322   BUN 22 10/12/2022 1433   BUN 14.5 11/22/2017 1451   CREATININE 0.77 12/31/2022 1322   CREATININE 0.74 01/23/2021 1453   CREATININE 0.8 11/22/2017 1451   CALCIUM 8.8 (L) 12/31/2022 1322   CALCIUM 9.1 11/22/2017 1451    Lab Results  Component Value Date   CREATININE 0.77 12/31/2022   EGFR 92 10/12/2022   GFRNONAA >60 12/31/2022   GFRAA 106 12/23/2020   No results found for: "HGBA1C", "FRUCTOSAMINE", "MICROALBUR"  Lab Results  Component  Value Date   CHOL 154 10/12/2022   HDL 82 10/12/2022   LDLCALC 59 10/12/2022   TRIG 67 10/12/2022   CHOLHDL 1.9 10/12/2022    Care Gaps: Annual wellness visit in last year? Yes Hep C screening overdue Tdap overdue Shingrix overdue Covid booster overdue  Star Rating Drugs:  Losartan 50 mg- Last filled 12 -21-2023 90 DS. Losartan 100 mg previous 08-22-2022 90 DS  Atorvastatin 40 mh- Last filled 12-31-2022 90 DS. Previous 10-05-2022 90 DS  [place] Clinical Pharmacist Assistant [phone]

## 2023-01-31 ENCOUNTER — Ambulatory Visit: Payer: Self-pay | Admitting: *Deleted

## 2023-01-31 NOTE — Patient Instructions (Signed)
Visit Information  Thank you for taking time to visit with me today. Please don't hesitate to contact me if I can be of assistance to you.   Following are the goals we discussed today:   Goals Addressed               This Visit's Progress     Patient Stated she feels overwhelmed sometimes and has depresion issues. She has challenges with meeting daily care needs (pt-stated)        Interventions: Follow up call to pt today who reports recovering from lumbar/back surgery she had last week. She was released to home the day after the surgery and has her daughter staying with her to help. Pt denies any concerns- stating her depression is better; however, the chronic pain "will always be there". Pt remains on Lexapro for depression. Pt previously shared struggling with finances since stopping her job in November feeling it was unsafe health wise and physically (she was an Engineer, production for [employer]). She now reports she has been able to get some bills paid off and that is helping her stress. She is unsure about pursuing a part time job that would be physically comfortable/or accommodating due to being on Social Security and limits there- as well as partial Medicaid she receives. She also reports getting Licensed conveyancer. Reminded her Voc [facility]hab for opportunities.  Previous notes indicate Daneen Schick, [place],  provided client information on [facility] for putting client  name on wait list for in home aide support . Pt aware and considering [facility] for in home aide support but feels ok now with her support from her daughter.    Pt appreciative and agreeable to follow up call 02/18/23          Our next appointment is by telephone on 02/18/23 at 1:30pm  Please call the care guide team at [phone] if you need to cancel or reschedule your appointment.   If you are experiencing a Mental Health or [place] or need someone to talk to, please call the Suicide and Crisis  Lifeline: 988 call the [place] National Suicide Prevention Lifeline: 7096169803 or TTY: (507)639-0392 TTY 636-858-8047) to talk to a trained counselor call 911   The patient verbalized understanding of instructions, educational materials, and care plan provided today and DECLINED offer to receive copy of patient instructions, educational materials, and care plan.   Telephone follow up appointment with care management team member scheduled for:02/18/23  Eduard Clos, MSW, [place] Worker [facility] [employer] [phone]

## 2023-01-31 NOTE — Patient Outreach (Signed)
  Care Coordination   Follow Up Visit Note   01/31/2023 Name: [name] MRN: [id number] DOB: 1951-12-10  Victoria Pena is a 72 y.o. year old female who sees Neil Crouch, FNP for primary care. I spoke with  Victoria Pena by phone today.  What matters to the patients health and wellness today?  "Back home after having surgery last week"    Goals Addressed               This Visit's Progress     Patient Stated she feels overwhelmed sometimes and has depresion issues. She has challenges with meeting daily care needs (pt-stated)        Interventions: Follow up call to pt today who reports recovering from lumbar/back surgery she had last week. She was released to home the day after the surgery and has her daughter staying with her to help. Pt denies any concerns- stating her depression is better; however, the chronic pain "will always be there". Pt remains on Lexapro for depression. Pt previously shared struggling with finances since stopping her job in November feeling it was unsafe health wise and physically (she was an Engineer, production for [employer]). She now reports she has been able to get some bills paid off and that is helping her stress. She is unsure about pursuing a part time job that would be physically comfortable/or accommodating due to being on Social Security and limits there- as well as partial Medicaid she receives. She also reports getting Licensed conveyancer. Reminded her Voc Rehab for opportunities.  Previous notes indicate Victoria Pena, [place],  provided client information on [facility] for putting client  name on wait list for in home aide support . Pt aware and considering [facility] for in home aide support but feels ok now with her support from her daughter.    Pt appreciative and agreeable to follow up call 02/18/23          SDOH assessments and interventions completed:  Yes     Care Coordination Interventions:  Yes, provided   Follow up plan:  Follow up call scheduled for 02/18/23    Encounter Outcome:  Pt. Visit Completed

## 2023-02-05 ENCOUNTER — Other Ambulatory Visit: Payer: Self-pay | Admitting: Nurse Practitioner

## 2023-02-05 DIAGNOSIS — F418 Other specified anxiety disorders: Secondary | ICD-10-CM

## 2023-02-09 ENCOUNTER — Other Ambulatory Visit: Payer: Self-pay

## 2023-02-09 DIAGNOSIS — F418 Other specified anxiety disorders: Secondary | ICD-10-CM

## 2023-02-09 MED ORDER — ESCITALOPRAM OXALATE 10 MG PO TABS: 10.0000 mg | ORAL_TABLET | Freq: Every day | ORAL | 1 refills | Status: DC

## 2023-02-10 ENCOUNTER — Telehealth: Payer: Medicare HMO

## 2023-02-10 ENCOUNTER — Ambulatory Visit (INDEPENDENT_AMBULATORY_CARE_PROVIDER_SITE_OTHER): Payer: Medicare HMO

## 2023-02-10 DIAGNOSIS — F418 Other specified anxiety disorders: Secondary | ICD-10-CM

## 2023-02-10 DIAGNOSIS — J432 Centrilobular emphysema: Secondary | ICD-10-CM

## 2023-02-10 DIAGNOSIS — I10 Essential (primary) hypertension: Secondary | ICD-10-CM

## 2023-02-10 DIAGNOSIS — F419 Anxiety disorder, unspecified: Secondary | ICD-10-CM

## 2023-02-10 NOTE — Chronic Care Management (AMB) (Signed)
Chronic Care Management   CCM RN Visit Note  02/10/2023 Name: Victoria Pena MRN: [id number] DOB: 31-Oct-1951  Subjective: Victoria Pena is a 72 y.o. year old female who is a primary care patient of Neil Crouch, FNP. The patient was referred to the Chronic Care Management team for assistance with care management needs subsequent to provider initiation of CCM services and plan of care.    Today's Visit:  Engaged with patient by telephone for follow up visit.        Goals Addressed             This Visit's Progress    CCM Expected Outcome:  Monitor, Self-Manage and Reduce Symptoms of: Depression, Anxiety, Stress       Current Barriers:  Care Coordination needs related to resources in the community for help with mental health needs and community resources for financial support in a patient with anxiety, depression, and stress Chronic Disease Management support and education needs related to effective management of stress, anxiety, and depression  Planned Interventions: Evaluation of current treatment plan related to stress, anxiety, and depression and patient's adherence to plan as established by provider. The patient is having a good day today. She states she did too much yesterday. The patient had surgery on 01-21-2023 for lumbar radiculopathy and is recovering well. Her daughter is staying with her currently and that is a big help. She will likely stay with her a couple more weeks. The patient now can get out of the bed by herself and states that she did try to sweep yesterday but she knows she can't do that yet. The patient states that she knows she needs to pace herself. She is used to being busy. She feels better each day. Reflective listening and support given. Advised patient to call the office for changes in stress, anxiety, and depression Provided education to patient re: working with the CCM team, support available with resources, ongoing outreaches for effective management of  stress, anxiety, and depression Reviewed medications with patient and discussed compliance. The patient is compliant with her medications. Denies any medication needs at this time. Works with the pharm D on effective medication management. Reviewed scheduled/upcoming provider appointments including 02-15-2023 at 1120 am Care Guide referral for resources in the community to help with food resources and financial constraints. Has been provided with information. The patient received information from the care guides but she states that none of those programs can actually help her. Will continue to monitor for changes and new programs that may be a resource for the patient.  Social Work referral for ongoing support and education related to effective management of depression, anxiety, stress. The patient has talked with the LCSW. The patient is feeling some better. She knows she needs to take things slow. She knows she cannot return to the work she previously did. The patient knows the care team is available to assist with needs.  Pharmacy referral for ongoing support and education of medication needs and management. Ongoing support and education from the pharm D. Discussed plans with patient for ongoing care management follow up and provided patient with direct contact information for care management team Advised patient to discuss changes in mood, anxiety, depression, or mental health needs with provider Screening for signs and symptoms of depression related to chronic disease state  Assessed social determinant of health barriers Discussed self care and doing hobbies that make the patient happy or going on events that do not cost money, or doing  things with family and friends. The patient knows that she overdid it yesterday and she states that she is pacing her activity today. Reflective listening, education, and support given. The patient knows to call the [facility] in between outreaches for new needs or concerns.    Symptom Management: Take medications as prescribed   Attend all scheduled provider appointments Call provider office for new concerns or questions  call the Suicide and Crisis Lifeline: 988 call the [place] National Suicide Prevention Lifeline: 713 676 7364 or TTY: (567)735-3524 TTY (303)231-6844) to talk to a trained counselor call 1-800-273-TALK (toll free, 24 hour hotline) if experiencing a Mental Health or [place]   Follow Up Plan: Telephone follow up appointment with care management team member scheduled for: 03-10-2023 at 345 pm        CCM Expected Outcome:  Monitor, Self-Manage, and Reduce Symptoms of Hypertension       Current Barriers:  Chronic Disease Management support and education needs related to effective management of HTN BP Readings from Last 3 Encounters:  01/22/23 125/76  01/13/23 114/72  01/05/23 118/60    Planned Interventions: Evaluation of current treatment plan related to hypertension self management and patient's adherence to plan as established by provider. Her blood pressures are stable and she is doing well post surgery for lumbar radiculopathy ;   Provided education to patient re: stroke prevention, s/s of heart attack and stroke; Reviewed prescribed diet heart healthy diet. The patient is compliant with a heart healthy diet.  Reviewed medications with patient and discussed importance of compliance. The patient is compliant with medications. Works with the [place] D on an ongoing basis;  Discussed plans with patient for ongoing care management follow up and provided patient with direct contact information for care management team; Advised patient, providing education and rationale, to monitor blood pressure daily and record, calling PCP for findings outside established parameters;  Reviewed scheduled/upcoming provider appointments including: 02-15-2023 at 1120 am Advised patient to discuss changes in HTN or heart health with provider; Provided  education on prescribed diet heart healthy ;  Discussed complications of poorly controlled blood pressure such as heart disease, stroke, circulatory complications, vision complications, kidney impairment, sexual dysfunction;  Screening for signs and symptoms of depression related to chronic disease state;  Assessed social determinant of health barriers;   Symptom Management: Take medications as prescribed   Attend all scheduled provider appointments Call provider office for new concerns or questions  call the Suicide and Crisis Lifeline: 988 call the [place] National Suicide Prevention Lifeline: 413 501 1121 or TTY: 667-726-7940 TTY (936)327-8432) to talk to a trained counselor call 1-800-273-TALK (toll free, 24 hour hotline) if experiencing a Mental Health or [place]  check blood pressure weekly learn about high blood pressure call doctor for signs and symptoms of high blood pressure develop an action plan for high blood pressure keep all doctor appointments take medications for blood pressure exactly as prescribed report new symptoms to your doctor  Follow Up Plan: Telephone follow up appointment with care management team member scheduled for: 03-10-2023 at 345 pm       CCM:  Maintain, Monitor and Self-Manage Symptoms of COPD       Current Barriers:  Chronic Disease Management support and education needs related to effective COPD  Planned Interventions: Provided patient with basic written and verbal COPD education on self care/management/and exacerbation prevention. The patient is doing well post surgery on 01-21-2023 for a lumbar radiculopathy. She denies any new issues with her breathing at  this time.  Advised patient to track and manage COPD triggers Provided written and verbal instructions on pursed lip breathing and utilized returned demonstration as teach back Provided instruction about proper use of medications used for management of COPD including  inhalers Advised patient to self assesses COPD action plan zone and make appointment with provider if in the yellow zone for 48 hours without improvement Advised patient to engage in light exercise as tolerated 3-5 days a week to aid in the the management of COPD Provided education about and advised patient to utilize infection prevention strategies to reduce risk of respiratory infection. The patient is mindful of her surroundings and is safe. The patient knows what worse looks like and to call for changes or needs.  Discussed the importance of adequate rest and management of fatigue with COPD Screening for signs and symptoms of depression related to chronic disease state  Assessed social determinant of health barriers  Symptom Management: Take medications as prescribed   Attend all scheduled provider appointments Call provider office for new concerns or questions  call the Suicide and Crisis Lifeline: 988 call the [place] National Suicide Prevention Lifeline: 249 683 3411 or TTY: (517)649-3110 TTY 432 747 1508) to talk to a trained counselor call 1-800-273-TALK (toll free, 24 hour hotline) if experiencing a Mental Health or [place]  identify and remove indoor air pollutants limit outdoor activity during cold weather listen for public air quality announcements every day develop a rescue plan eliminate symptom triggers at home follow rescue plan if symptoms flare-up  Follow Up Plan: Telephone follow up appointment with care management team member scheduled for: 03-10-2023 and 345 pm          Plan:Telephone follow up appointment with care management team member scheduled for:  03-10-2023 at 64 pm  [place], MSN, CCM RN Care Manager  Chronic Care Management Direct Number: [phone]

## 2023-02-10 NOTE — Patient Instructions (Signed)
Please call the care guide team at [phone] if you need to cancel or reschedule your appointment.   If you are experiencing a Mental Health or [place] or need someone to talk to, please call the Suicide and Crisis Lifeline: 988 call the [place] National Suicide Prevention Lifeline: 641-649-8173 or TTY: 279-780-7267 TTY (678)447-1067) to talk to a trained counselor call 1-800-273-TALK (toll free, 24 hour hotline)   Following is a copy of the CCM Program Consent:  CCM service includes personalized support from designated clinical staff supervised by the physician, including individualized plan of care and coordination with other care providers 24/7 contact phone numbers for assistance for urgent and routine care needs. Service will only be billed when office clinical staff spend 20 minutes or more in a month to coordinate care. Only one practitioner may furnish and bill the service in a calendar month. The patient may stop CCM services at amy time (effective at the end of the month) by phone call to the office staff. The patient will be responsible for cost sharing (co-pay) or up to 20% of the service fee (after annual deductible is met)  Following is a copy of your full provider care plan:   Goals Addressed             This Visit's Progress    CCM Expected Outcome:  Monitor, Self-Manage and Reduce Symptoms of: Depression, Anxiety, Stress       Current Barriers:  Care Coordination needs related to resources in the community for help with mental health needs and community resources for financial support in a patient with anxiety, depression, and stress Chronic Disease Management support and education needs related to effective management of stress, anxiety, and depression  Planned Interventions: Evaluation of current treatment plan related to stress, anxiety, and depression and patient's adherence to plan as established by provider. The patient is having a good day today.  She states she did too much yesterday. The patient had surgery on 01-21-2023 for lumbar radiculopathy and is recovering well. Her daughter is staying with her currently and that is a big help. She will likely stay with her a couple more weeks. The patient now can get out of the bed by herself and states that she did try to sweep yesterday but she knows she can't do that yet. The patient states that she knows she needs to pace herself. She is used to being busy. She feels better each day. Reflective listening and support given. Advised patient to call the office for changes in stress, anxiety, and depression Provided education to patient re: working with the CCM team, support available with resources, ongoing outreaches for effective management of stress, anxiety, and depression Reviewed medications with patient and discussed compliance. The patient is compliant with her medications. Denies any medication needs at this time. Works with the pharm D on effective medication management. Reviewed scheduled/upcoming provider appointments including 02-15-2023 at 1120 am Care Guide referral for resources in the community to help with food resources and financial constraints. Has been provided with information. The patient received information from the care guides but she states that none of those programs can actually help her. Will continue to monitor for changes and new programs that may be a resource for the patient.  Social Work referral for ongoing support and education related to effective management of depression, anxiety, stress. The patient has talked with the LCSW. The patient is feeling some better. She knows she needs to take things slow. She knows  she cannot return to the work she previously did. The patient knows the care team is available to assist with needs.  Pharmacy referral for ongoing support and education of medication needs and management. Ongoing support and education from the pharm D. Discussed  plans with patient for ongoing care management follow up and provided patient with direct contact information for care management team Advised patient to discuss changes in mood, anxiety, depression, or mental health needs with provider Screening for signs and symptoms of depression related to chronic disease state  Assessed social determinant of health barriers Discussed self care and doing hobbies that make the patient happy or going on events that do not cost money, or doing things with family and friends. The patient knows that she overdid it yesterday and she states that she is pacing her activity today. Reflective listening, education, and support given. The patient knows to call the [facility] in between outreaches for new needs or concerns.   Symptom Management: Take medications as prescribed   Attend all scheduled provider appointments Call provider office for new concerns or questions  call the Suicide and Crisis Lifeline: 988 call the [place] National Suicide Prevention Lifeline: 820-441-5810 or TTY: 986-105-0844 TTY 801-002-1092) to talk to a trained counselor call 1-800-273-TALK (toll free, 24 hour hotline) if experiencing a Mental Health or [place]   Follow Up Plan: Telephone follow up appointment with care management team member scheduled for: 03-10-2023 at 345 pm        CCM Expected Outcome:  Monitor, Self-Manage, and Reduce Symptoms of Hypertension       Current Barriers:  Chronic Disease Management support and education needs related to effective management of HTN BP Readings from Last 3 Encounters:  01/22/23 125/76  01/13/23 114/72  01/05/23 118/60    Planned Interventions: Evaluation of current treatment plan related to hypertension self management and patient's adherence to plan as established by provider. Her blood pressures are stable and she is doing well post surgery for lumbar radiculopathy ;   Provided education to patient re: stroke prevention, s/s of  heart attack and stroke; Reviewed prescribed diet heart healthy diet. The patient is compliant with a heart healthy diet.  Reviewed medications with patient and discussed importance of compliance. The patient is compliant with medications. Works with the [facility] [place] D on an ongoing basis;  Discussed plans with patient for ongoing care management follow up and provided patient with direct contact information for care management team; Advised patient, providing education and rationale, to monitor blood pressure daily and record, calling PCP for findings outside established parameters;  Reviewed scheduled/upcoming provider appointments including: 02-15-2023 at 1120 am Advised patient to discuss changes in HTN or heart health with provider; Provided education on prescribed diet heart healthy ;  Discussed complications of poorly controlled blood pressure such as heart disease, stroke, circulatory complications, vision complications, kidney impairment, sexual dysfunction;  Screening for signs and symptoms of depression related to chronic disease state;  Assessed social determinant of health barriers;   Symptom Management: Take medications as prescribed   Attend all scheduled provider appointments Call provider office for new concerns or questions  call the Suicide and Crisis Lifeline: 988 call the [place] National Suicide Prevention Lifeline: 910 540 6035 or TTY: 332-664-7758 TTY 725-239-0062) to talk to a trained counselor call 1-800-273-TALK (toll free, 24 hour hotline) if experiencing a Mental Health or [place]  check blood pressure weekly learn about high blood pressure call doctor for signs and symptoms of high blood pressure  develop an action plan for high blood pressure keep all doctor appointments take medications for blood pressure exactly as prescribed report new symptoms to your doctor  Follow Up Plan: Telephone follow up appointment with care management team member  scheduled for: 03-10-2023 at 345 pm       CCM:  Maintain, Monitor and Self-Manage Symptoms of COPD       Current Barriers:  Chronic Disease Management support and education needs related to effective COPD  Planned Interventions: Provided patient with basic written and verbal COPD education on self care/management/and exacerbation prevention. The patient is doing well post surgery on 01-21-2023 for a lumbar radiculopathy. She denies any new issues with her breathing at this time.  Advised patient to track and manage COPD triggers Provided written and verbal instructions on pursed lip breathing and utilized returned demonstration as teach back Provided instruction about proper use of medications used for management of COPD including inhalers Advised patient to self assesses COPD action plan zone and make appointment with provider if in the yellow zone for 48 hours without improvement Advised patient to engage in light exercise as tolerated 3-5 days a week to aid in the the management of COPD Provided education about and advised patient to utilize infection prevention strategies to reduce risk of respiratory infection. The patient is mindful of her surroundings and is safe. The patient knows what worse looks like and to call for changes or needs.  Discussed the importance of adequate rest and management of fatigue with COPD Screening for signs and symptoms of depression related to chronic disease state  Assessed social determinant of health barriers  Symptom Management: Take medications as prescribed   Attend all scheduled provider appointments Call provider office for new concerns or questions  call the Suicide and Crisis Lifeline: 988 call the [place] National Suicide Prevention Lifeline: 8582844618 or TTY: (973) 712-1855 TTY 662-721-1983) to talk to a trained counselor call 1-800-273-TALK (toll free, 24 hour hotline) if experiencing a Mental Health or [place]  identify and  remove indoor air pollutants limit outdoor activity during cold weather listen for public air quality announcements every day develop a rescue plan eliminate symptom triggers at home follow rescue plan if symptoms flare-up  Follow Up Plan: Telephone follow up appointment with care management team member scheduled for: 03-10-2023 and 345 pm          Patient verbalizes understanding of instructions and care plan provided today and agrees to view in [place]. Active MyChart status and patient understanding of how to access instructions and care plan via MyChart confirmed with patient.     Telephone follow up appointment with care management team member scheduled for: 03-10-2023 at 345 pm

## 2023-02-15 ENCOUNTER — Ambulatory Visit: Payer: Medicare HMO | Admitting: Nurse Practitioner

## 2023-02-17 DIAGNOSIS — I10 Essential (primary) hypertension: Secondary | ICD-10-CM

## 2023-02-17 DIAGNOSIS — J432 Centrilobular emphysema: Secondary | ICD-10-CM

## 2023-02-17 DIAGNOSIS — F418 Other specified anxiety disorders: Secondary | ICD-10-CM

## 2023-02-21 ENCOUNTER — Telehealth: Payer: Self-pay | Admitting: *Deleted

## 2023-02-21 ENCOUNTER — Ambulatory Visit (HOSPITAL_COMMUNITY): Payer: Medicare HMO

## 2023-02-21 ENCOUNTER — Inpatient Hospital Stay: Payer: Medicare HMO

## 2023-02-21 NOTE — Progress Notes (Signed)
  Care Coordination Note  02/21/2023 Name: Victoria Pena MRN: [id number] DOB: December 19, 1951  [name] is a 72 y.o. year old female who is a primary care patient of Aryal, Palma Holter, FNP and is actively engaged with the care management team. I reached out to [name] by phone today to assist with re-scheduling a follow up visit with the Licensed Clinical Social Worker  Follow up plan: Unsuccessful telephone outreach attempt made. A HIPAA compliant phone message was left for the patient providing contact information and requesting a return call.  [place]  Direct Dial: [phone]

## 2023-02-22 ENCOUNTER — Telehealth: Payer: Self-pay | Admitting: Physician Assistant

## 2023-02-22 ENCOUNTER — Encounter: Payer: Self-pay | Admitting: Physician Assistant

## 2023-02-22 NOTE — Telephone Encounter (Signed)
I received a message from the CT department this morning that the patient called to cancel her scan and has not rescheduled.  The patient is scheduled to see [place] on Monday, 02/28/2023.  I called the patient to discuss this further.  I was unable to reach her.  I left a voicemail letting her know that we will cancel her appointment on Monday, 02/28/2023 and reschedule it for after her CT scan is performed.  I left the callback number to the radiology department to reschedule her scan and then I left our callback number to reschedule her follow-up appointment with [place] after the scan is complete.  If she has any further questions, she was advised to call us back or send [place] a MyChart message.

## 2023-02-23 ENCOUNTER — Telehealth: Payer: Self-pay | Admitting: Internal Medicine

## 2023-02-23 ENCOUNTER — Other Ambulatory Visit: Payer: Self-pay | Admitting: Nurse Practitioner

## 2023-02-23 ENCOUNTER — Encounter: Payer: Self-pay | Admitting: Podiatry

## 2023-02-23 ENCOUNTER — Ambulatory Visit: Payer: Medicare HMO | Admitting: Podiatry

## 2023-02-23 DIAGNOSIS — L600 Ingrowing nail: Secondary | ICD-10-CM

## 2023-02-23 DIAGNOSIS — I10 Essential (primary) hypertension: Secondary | ICD-10-CM

## 2023-02-23 DIAGNOSIS — L03032 Cellulitis of left toe: Secondary | ICD-10-CM | POA: Diagnosis not present

## 2023-02-23 DIAGNOSIS — F418 Other specified anxiety disorders: Secondary | ICD-10-CM

## 2023-02-23 NOTE — Telephone Encounter (Signed)
Scheduled per 03/06 scheduling message, patient has been called and voicemail was left.

## 2023-02-23 NOTE — Progress Notes (Deleted)
Subjective:  Patient ID: Victoria Pena, female    DOB: 05-Aug-1951  Age: 72 y.o. MRN: [id number]  No chief complaint on file.    History of Present illness:   Current Outpatient Medications on File Prior to Visit  Medication Sig Dispense Refill   albuterol (VENTOLIN HFA) 108 (90 Base) MCG/ACT inhaler Inhale 2 puffs into the lungs every 6 (six) hours as needed for wheezing or shortness of breath. 8 g 0   ALPRAZolam (XANAX) 0.5 MG tablet TAKE 1 TABLET(0.5 MG) BY MOUTH TWICE DAILY AS NEEDED FOR ANXIETY (Patient taking differently: Take 0.5 mg by mouth 2 (two) times daily as needed for anxiety. TAKE 1 TABLET(0.5 MG) BY MOUTH TWICE DAILY AS NEEDED FOR ANXIETY) 60 tablet 3   atorvastatin (LIPITOR) 40 MG tablet TAKE 1 TABLET(40 MG) BY MOUTH DAILY (Patient taking differently: Take 40 mg by mouth daily.) 90 tablet 2   buPROPion (WELLBUTRIN XL) 150 MG 24 hr tablet Take 1 tablet (150 mg total) by mouth daily. 90 tablet 0   Calcium Carb-Cholecalciferol (CALCIUM 500 + D3) 500-5 MG-MCG TABS Take 3 tablets by mouth daily. (Patient taking differently: Take 1 tablet by mouth in the morning and at bedtime.) 100 tablet 5   calcium carbonate (TUMS - DOSED IN MG ELEMENTAL CALCIUM) 500 MG chewable tablet Chew 1 tablet by mouth 2 (two) times daily as needed for indigestion or heartburn.     cyclobenzaprine (FLEXERIL) 10 MG tablet Take 1 tablet (10 mg total) by mouth 3 (three) times daily as needed for muscle spasms. 30 tablet 3   denosumab (PROLIA) 60 MG/ML SOSY injection Inject 60 mg into the skin every 6 (six) months.     EPINEPHrine 0.3 mg/0.3 mL IJ SOAJ injection Inject 0.3 mg into the muscle as needed for anaphylaxis. 1 each 1   escitalopram (LEXAPRO) 10 MG tablet Take 1 tablet (10 mg total) by mouth daily. 90 tablet 1   fluticasone (FLONASE) 50 MCG/ACT nasal spray Place 2 sprays into both nostrils daily. (Patient taking differently: Place 2 sprays into both nostrils at bedtime.) 16 g 6   levothyroxine  (SYNTHROID) 125 MCG tablet Take 1 tablet (125 mcg total) by mouth daily before breakfast. TAKE 1 TABLET(125 MCG) BY MOUTH DAILY Strength: 125 mcg 90 tablet 0   losartan (COZAAR) 50 MG tablet Take 1 tablet (50 mg total) by mouth daily. 90 tablet 0   metroNIDAZOLE (METROGEL) 0.75 % gel Apply 1 application topically 2 (two) times daily. (Patient taking differently: Apply 1 application  topically 2 (two) times daily as needed (rosacea).) 45 g 3   Multiple Vitamin (MULTIVITAMIN WITH MINERALS) TABS tablet Take 1 tablet by mouth in the morning and at bedtime.     mupirocin ointment (BACTROBAN) 2 % Apply 1 Application topically 2 (two) times daily. 22 g 0   oxyCODONE 10 MG TABS Take 1 tablet (10 mg total) by mouth every 3 (three) hours as needed for severe pain ((score 7 to 10)). 40 tablet 0   pantoprazole (PROTONIX) 40 MG tablet Take 1 tablet (40 mg total) by mouth daily. 90 tablet 2   pregabalin (LYRICA) 150 MG capsule TAKE 1 CAPSULE(150 MG) BY MOUTH TWICE DAILY (Patient taking differently: Take 150 mg by mouth 2 (two) times daily.) 60 capsule 3   Spacer/Aero-Holding Chambers DEVI 1 each by Does not apply route daily. 1 each 0   sulfamethoxazole-trimethoprim (BACTRIM DS) 800-160 MG tablet Take 1 tablet by mouth 2 (two) times daily. 60 tablet 0  trolamine salicylate (ASPERCREME) 10 % cream Apply 1 Application topically as needed for muscle pain.     No current facility-administered medications on file prior to visit.   Past Medical History:  Diagnosis Date   Acquired spondylolisthesis 05/12/2015   Adenocarcinoma of right lung, stage 1 ([place]) 07/26/2016   Anxiety    Arthritis    Arthropathy of lumbar facet joint 04/11/2019   Formatting of this note might be different from the original. Added automatically from request for surgery [id number]   Atherosclerosis of aorta ([place]) 07/08/2021   Benign essential hypertension 12/27/2016   BMI 30.0-30.9,adult 12/23/2020   COPD (chronic obstructive pulmonary  disease) ([place])    pt denies   Current mild episode of major depressive disorder ([place]) 03/24/2020   Daytime sleepiness 08/03/2021   Depression    Fibromyalgia 03/24/2020   Gastroesophageal reflux disease without esophagitis 03/24/2020   Graves disease 12/27/2016   Hypertension    Osteoporosis 08/03/2021   Pericardial cyst 02/02/2021   Postoperative hypothyroidism 01/17/2015   Reactive airways dysfunction syndrome, unspecified asthma severity, uncomplicated ([place]) 0000000   S/P lobectomy of lung 07/26/2015   Formatting of this note might be different from the original. Right upper   S/P lumbar spinal fusion 05/11/2018   Thyroid disease    Toe ulcer, left, limited to breakdown of skin ([place]) 12/23/2020   Past Surgical History:  Procedure Laterality Date   ANKLE SURGERY     bilateral   APPENDECTOMY     BREAST BIOPSY Left 02/25/2020   BREAST BIOPSY Right 08/17/2018   BREAST EXCISIONAL BIOPSY Right    unsure when but marked with scar marker   CHOLECYSTECTOMY     EYE MUSCLE SURGERY Bilateral    4-5 years ago   LAMINECTOMY WITH POSTERIOR LATERAL ARTHRODESIS LEVEL 2 N/A 01/21/2023   Procedure: Laminectomy  - L1-L2 - L2-L3 with non-instrumented posterior lateral fusion;  Surgeon: Eustace Moore, MD;  Location: [facility];  Service: Neurosurgery;  Laterality: N/A;   LUMBAR FUSION  2019   L4, 5, 6   REPLACEMENT TOTAL KNEE Left    THYROIDECTOMY      Family History  Problem Relation Age of Onset   Cancer Mother    Cancer Father    Cancer Brother    Autism Grandson    Breast cancer Neg Hx    Social History   Socioeconomic History   Marital status: Widowed    Spouse name: Not on file   Number of children: Not on file   Years of education: Not on file   Highest education level: Not on file  Occupational History   Not on file  Tobacco Use   Smoking status: Former    Packs/day: 1.50    Years: 35.00    Total pack years: 52.50    Types: Cigarettes    Quit date: 07/27/1999    Years  since quitting: 23.5   Smokeless tobacco: Never  Vaping Use   Vaping Use: Never used  Substance and Sexual Activity   Alcohol use: No   Drug use: No   Sexual activity: Not Currently  Other Topics Concern   Not on file  Social History Narrative   Not on file   Social Determinants of Health   Financial Resource Strain: High Risk (01/13/2023)   Overall Financial Resource Strain (CARDIA)    Difficulty of Paying Living Expenses: Hard  Food Insecurity: Food Insecurity Present (01/13/2023)   Hunger Vital Sign    Worried About Running Out of  Food in the Last Year: Often true    Ran Out of Food in the Last Year: Often true  Transportation Needs: No Transportation Needs (01/13/2023)   PRAPARE - Hydrologist (Medical): No    Lack of Transportation (Non-Medical): No  Physical Activity: Insufficiently Active (12/08/2022)   Exercise Vital Sign    Days of Exercise per Week: 1 day    Minutes of Exercise per Session: 10 min  Stress: Stress Concern Present (01/13/2023)   [place]    Feeling of Stress : Rather much  Social Connections: Socially Isolated (01/13/2023)   Social Connection and Isolation Panel [NHANES]    Frequency of Communication with Friends and Family: More than three times a week    Frequency of Social Gatherings with Friends and Family: Twice a week    Attends Religious Services: Never    Marine scientist or Organizations: No    Attends Archivist Meetings: Never    Marital Status: Widowed    Review of Systems  Constitutional:  Negative for chills, diaphoresis, fatigue and fever.  HENT:  Negative for congestion, ear pain and sore throat.   Respiratory:  Negative for cough and shortness of breath.   Cardiovascular:  Negative for chest pain and palpitations.  Gastrointestinal:  Negative for abdominal pain, constipation, diarrhea, nausea and vomiting.  Endocrine:  Negative for polydipsia, polyphagia and polyuria.  Genitourinary:  Negative for dysuria and frequency.  Musculoskeletal:  Negative for arthralgias, back pain and myalgias.  Neurological:  Negative for dizziness and headaches.  Psychiatric/Behavioral:  Negative for dysphoric mood. The patient is not nervous/anxious.      Objective:  LMP  (LMP Unknown)      01/22/2023    7:28 AM 01/22/2023    4:45 AM 01/22/2023   12:58 AM  BP/Weight  Systolic BP 0000000 [id number]  Diastolic BP 76 85 57    Physical Exam Vitals reviewed.  Constitutional:      Appearance: Normal appearance.  Neck:     Vascular: No carotid bruit.  Cardiovascular:     Rate and Rhythm: Normal rate and regular rhythm.     Heart sounds: Normal heart sounds.  Pulmonary:     Effort: Pulmonary effort is normal.     Breath sounds: Normal breath sounds.  Abdominal:     General: Bowel sounds are normal.     Palpations: Abdomen is soft.     Tenderness: There is no abdominal tenderness.  Neurological:     Mental Status: She is alert and oriented to person, place, and time.  Psychiatric:        Mood and Affect: Mood normal.        Behavior: Behavior normal.     Diabetic Foot Exam - Simple   No data filed        12/30/2022    1:31 PM 12/29/2022    1:24 PM 12/29/2022    1:02 PM 12/16/2022    2:34 PM 12/08/2022   10:35 AM  Depression screen PHQ 2/9  Decreased Interest 2  0 2 2  Down, Depressed, Hopeless 0  '1 2 2  '$ PHQ - 2 Score '2  1 4 4  '$ Altered sleeping '3  1 2 2  '$ Tired, decreased energy '3  1 2 2  '$ Change in appetite 0  0 1 2  Feeling bad or failure about yourself  1  0 1 1  Trouble concentrating  1  0 1 2  Moving slowly or fidgety/restless 0  0 1 1  Suicidal thoughts 0  0 1 0  PHQ-9 Score '10  3 13 14  '$ Difficult doing work/chores Very difficult Somewhat difficult  Somewhat difficult Very difficult       04/23/2022    2:28 PM 04/27/2022    3:08 PM 09/06/2022   10:49 AM 09/06/2022   10:51 AM 01/13/2023    4:24 PM  Two Strike in the past year? 1 1   0  Was there an injury with Fall? 0 0   0  Fall Risk Category Calculator 2 2   0  Fall Risk Category (Retired) Moderate Moderate     (RETIRED) Patient Fall Risk Level  Moderate fall risk Low fall risk Moderate fall risk   Patient at Risk for Falls Due to  Impaired balance/gait   No Fall Risks  Fall risk Follow up  Falls prevention discussed   Falls evaluation completed;Education provided;Falls prevention discussed    Lab Results  Component Value Date   WBC 5.4 12/31/2022   HGB 12.2 12/31/2022   HCT 38.5 12/31/2022   PLT 233 12/31/2022   GLUCOSE 94 12/31/2022   CHOL 154 10/12/2022   TRIG 67 10/12/2022   HDL 82 10/12/2022   LDLCALC 59 10/12/2022   ALT 14 10/12/2022   AST 14 10/12/2022   NA 139 12/31/2022   K 4.1 12/31/2022   CL 107 12/31/2022   CREATININE 0.77 12/31/2022   BUN 15 12/31/2022   CO2 22 12/31/2022   TSH 2.920 08/10/2022   INR 1.0 12/31/2022      Assessment & Plan:   There are no diagnoses linked to this encounter.   Follow-up: No follow-ups on file.  An After Visit Summary was printed and given to the patient.  I, Neil Crouch have reviewed all documentation for this visit. The documentation on 02/23/23   for the exam, diagnosis, procedures, and orders are all accurate and complete.    Neil Crouch, DNP, [facility] [phone]

## 2023-02-23 NOTE — Patient Instructions (Signed)

## 2023-02-23 NOTE — Progress Notes (Signed)
Subjective:  Patient ID: Victoria Pena, female    DOB: 17-Sep-1951,   MRN: [id number]  Chief Complaint  Patient presents with   Nail Problem    left great toe nail - lifting, draining pus & blood    72 y.o. female presents for concern of left great toenail infection. Relates the left toenail has been lifting and draining blood and pus for a couple months. She denies any pain currently but concerned for ingrown toenail . Denies any other pedal complaints. Denies n/v/f/c.   Past Medical History:  Diagnosis Date   Acquired spondylolisthesis 05/12/2015   Adenocarcinoma of right lung, stage 1 ([place]) 07/26/2016   Anxiety    Arthritis    Arthropathy of lumbar facet joint 04/11/2019   Formatting of this note might be different from the original. Added automatically from request for surgery [id number]   Atherosclerosis of aorta ([place]) 07/08/2021   Benign essential hypertension 12/27/2016   BMI 30.0-30.9,adult 12/23/2020   COPD (chronic obstructive pulmonary disease) ([place])    pt denies   Current mild episode of major depressive disorder ([place]) 03/24/2020   Daytime sleepiness 08/03/2021   Depression    Fibromyalgia 03/24/2020   Gastroesophageal reflux disease without esophagitis 03/24/2020   Graves disease 12/27/2016   Hypertension    Osteoporosis 08/03/2021   Pericardial cyst 02/02/2021   Postoperative hypothyroidism 01/17/2015   Reactive airways dysfunction syndrome, unspecified asthma severity, uncomplicated ([place]) 0000000   S/P lobectomy of lung 07/26/2015   Formatting of this note might be different from the original. Right upper   S/P lumbar spinal fusion 05/11/2018   Thyroid disease    Toe ulcer, left, limited to breakdown of skin ([place]) 12/23/2020    Objective:  Physical Exam: Vascular: DP/PT pulses 2/4 bilateral. CFT <3 seconds. Normal hair growth on digits. No edema.  Skin. No lacerations or abrasions bilateral feet. Incurvation of lateral border of left great toenail. There  is some hemorrhagin noted undelrying nail. No pururlence noted. No tenderness Musculoskeletal: MMT 5/5 bilateral lower extremities in DF, PF, Inversion and Eversion. Deceased ROM in DF of ankle joint.  Neurological: Sensation intact to light touch.   Assessment:   1. Ingrown left greater toenail      Plan:  Patient was evaluated and treated and all questions answered. Discussed ingrown toenails etiology and treatment options including procedure for removal vs conservative care.  Patient requesting removal of ingrown nail today. Procedure below.  Discussed procedure and post procedure care and patient expressed understanding.  Will follow-up in 2 weeks for nail check or sooner if any problems arise.    Procedure:  Procedure: partial Nail Avulsion of left hallux lateral nail border.  Surgeon: Lorenda Peck, DPM  Pre-op Dx: Ingrown toenail without infection Post-op: Same  Place of Surgery: Office exam room.  Indications for surgery: Painful and ingrown toenail.    The patient is requesting removal of nail with chemical matrixectomy. Risks and complications were discussed with the patient for which they understand and written consent was obtained. Under sterile conditions a total of 3 mL of  1% lidocaine plain was infiltrated in a hallux block fashion. Once anesthetized, the skin was prepped in sterile fashion. A tourniquet was then applied. Next the lateral aspect of hallux nail border was then sharply excised making sure to remove the entire offending nail border.  Next phenol was then applied under standard conditions and copiously irrigated. Silvadene was applied. A dry sterile dressing was applied. After application of the dressing the tourniquet  was removed and there is found to be an immediate capillary refill time to the digit. The patient tolerated the procedure well without any complications. Post procedure instructions were discussed the patient for which he verbally understood.  Follow-up in two weeks for nail check or sooner if any problems are to arise. Discussed signs/symptoms of infection and directed to call the office immediately should any occur or go directly to the emergency room. In the meantime, encouraged to call the office with any questions, concerns, changes symptoms.   Lorenda Peck, DPM

## 2023-02-24 ENCOUNTER — Encounter: Payer: Medicare HMO | Admitting: Nurse Practitioner

## 2023-02-25 NOTE — Progress Notes (Signed)
  Care Coordination Note  02/25/2023 Name: [name] MRN: [id number] DOB: [birth year]  Victoria Pena is a 72 y.o. year old female who is a primary care patient of Aryal, Palma Hol[facility], [facility] and is actively engaged with the care management team. I reached out to [name] by phone today to assist with re-scheduling a follow up visit with the Licensed Clinical Social Worker  Follow up plan: Telephone appointment with care management team member scheduled for:03/01/23  [place]  Direct Dial: [phone]

## 2023-02-26 ENCOUNTER — Other Ambulatory Visit: Payer: Self-pay | Admitting: Nurse Practitioner

## 2023-02-26 DIAGNOSIS — I10 Essential (primary) hypertension: Secondary | ICD-10-CM

## 2023-02-27 DIAGNOSIS — Z5941 Food insecurity: Secondary | ICD-10-CM | POA: Diagnosis not present

## 2023-02-27 DIAGNOSIS — I10 Essential (primary) hypertension: Secondary | ICD-10-CM | POA: Diagnosis not present

## 2023-02-27 DIAGNOSIS — R079 Chest pain, unspecified: Secondary | ICD-10-CM | POA: Diagnosis not present

## 2023-02-27 DIAGNOSIS — R0789 Other chest pain: Secondary | ICD-10-CM | POA: Diagnosis not present

## 2023-02-28 ENCOUNTER — Telehealth: Payer: Self-pay

## 2023-02-28 ENCOUNTER — Inpatient Hospital Stay: Payer: Medicare HMO | Admitting: Physician Assistant

## 2023-02-28 ENCOUNTER — Telehealth: Payer: Self-pay | Admitting: Cardiology

## 2023-02-28 ENCOUNTER — Other Ambulatory Visit: Payer: Self-pay

## 2023-02-28 ENCOUNTER — Encounter: Payer: Medicare HMO | Admitting: Nurse Practitioner

## 2023-02-28 DIAGNOSIS — R079 Chest pain, unspecified: Secondary | ICD-10-CM | POA: Diagnosis not present

## 2023-02-28 DIAGNOSIS — F418 Other specified anxiety disorders: Secondary | ICD-10-CM

## 2023-02-28 DIAGNOSIS — I10 Essential (primary) hypertension: Secondary | ICD-10-CM

## 2023-02-28 MED ORDER — LOSARTAN POTASSIUM 50 MG PO TABS: 50.0000 mg | ORAL_TABLET | Freq: Every day | ORAL | 1 refills | Status: DC

## 2023-02-28 MED ORDER — BUPROPION HCL ER (XL) 150 MG PO TB24: 150.0000 mg | ORAL_TABLET | Freq: Every day | ORAL | 1 refills | Status: DC

## 2023-02-28 NOTE — Telephone Encounter (Signed)
Sent to front desk to make appt for hosp follow up- per pt request

## 2023-02-28 NOTE — Progress Notes (Signed)
Care Management & Coordination Services Pharmacy Team  Reason for Encounter: Appointment Reminder  Contacted patient to confirm telephone appointment with Arizona Constable, PharmD on 03/02/23 at 3:00 pm.  Unsuccessful outreach. Left voicemail for patient to return call.   Chart review:  Recent office visits:  None  Recent consult visits:  02/23/23 (Podiatry) Lorenda Peck DPM. Seen for nail problem. No med changes.   Hospital visits:  02/27/23 No notes available    Star Rating Drugs:  Medication:  Last Fill: Day Supply Atorvastatin   12/31/22-10/05/22 90ds Losartan 50/'100mg'$  12/09/22-11/29/22 90ds   Care Gaps: Annual wellness visit in last year? Yes  If Diabetic:N/A Last eye exam / retinopathy screening: Last diabetic foot exam:   Elray Mcgregor, [place] Pharmacist Assistant  [phone]

## 2023-02-28 NOTE — Telephone Encounter (Signed)
Pt states she was seen in the hospital and needs a hospital f/u. Informed her Dr. Raliegh Ip has May and APP in [place] has April and offered wait list. Pt wants to know what she should do because she does not want to wait that long.

## 2023-03-01 ENCOUNTER — Ambulatory Visit: Payer: Self-pay | Admitting: *Deleted

## 2023-03-01 NOTE — Patient Outreach (Signed)
  Care Coordination   Follow Up Visit Note   03/01/2023 Name: Delynda Sepulveda MRN: [id number] DOB: 05/04/1951  Ekta Dancer is a 72 y.o. year old female who sees Neil Crouch, FNP for primary care. I spoke with  Roe Rutherford Better by phone today.   What matters to the patients health and wellness today?  Feeling better (depression). "Went to the ER by EMS Sunday- thought I was having a heart attack"    Goals Addressed               This Visit's Progress     Patient Stated she feels overwhelmed sometimes and has depresion issues. She has challenges with meeting daily care needs (pt-stated)        Interventions: Follow up call to pt today who reports recovering from lumbar/back surgery- healing and doing better but did have to go to ER with concerns for heart attack- may be the peri-cardial cyst she knew about.  Pt stating her depression is better; completed the Depression Screening today; scoring better with "3" which is down from "10". Pt remains on Lexapro for depression. Pt previously shared struggling with finances since stopping her job in November feeling it was unsafe health wise and physically (she was an Engineer, production for [employer]). She now reports she has been able to get some bills paid off and that is helping her stress. She is unsure about pursuing a part time job that would be physically comfortable/or accommodating due to being on Social Security and limits there- as well as partial Medicaid she receives. She also reports getting Licensed conveyancer.      Pt appreciative and agreeable to follow up call 4/11//24          SDOH assessments and interventions completed:  Yes  SDOH Interventions Today    Flowsheet Row Most Recent Value  SDOH Interventions   Depression Interventions/Treatment  Medication        Care Coordination Interventions:  Yes, provided  Interventions Today    Flowsheet Row Most Recent Value  General Interventions   General Interventions  Discussed/Reviewed Community Resources  Mental Health Interventions   Mental Health Discussed/Reviewed Coping Strategies, Depression, Mental Health Discussed, Mental Health Reviewed       Follow up plan: Follow up call scheduled for 03/31/23    Encounter Outcome:  Pt. Visit Completed     .

## 2023-03-01 NOTE — Patient Instructions (Signed)
Visit Information  Thank you for taking time to visit with me today. Please don't hesitate to contact me if I can be of assistance to you.   Following are the goals we discussed today:   Goals Addressed               This Visit's Progress     Patient Stated she feels overwhelmed sometimes and has depresion issues. She has challenges with meeting daily care needs (pt-stated)        Interventions: Follow up call to pt today who reports recovering from lumbar/back surgery- healing and doing better but did have to go to ER with concerns for heart attack- may be the peri-cardial cyst she knew about.  Pt stating her depression is better; completed the Depression Screening today; scoring better with "3" which is down from "10". Pt remains on Lexapro for depression. Pt previously shared struggling with finances since stopping her job in November feeling it was unsafe health wise and physically (she was an Engineer, production for [employer]). She now reports she has been able to get some bills paid off and that is helping her stress. She is unsure about pursuing a part time job that would be physically comfortable/or accommodating due to being on Social Security and limits there- as well as partial Medicaid she receives. She also reports getting Licensed conveyancer.      Pt appreciative and agreeable to follow up call 4/11//24          Our next appointment is by telephone on 03/31/23  Please call the care guide team at [phone] if you need to cancel or reschedule your appointment.   If you are experiencing a Mental Health or [place] or need someone to talk to, please call the Suicide and Crisis Lifeline: 988 call 911   The patient verbalized understanding of instructions, educational materials, and care plan provided today and DECLINED offer to receive copy of patient instructions, educational materials, and care plan.   Telephone follow up appointment with care management team member  scheduled for:03/31/23  Eduard Clos, MSW, [place] Worker [employer] [employer] [phone]

## 2023-03-02 ENCOUNTER — Ambulatory Visit: Payer: Medicare HMO

## 2023-03-02 NOTE — Patient Outreach (Signed)
Spoke with patient. She was very kind and since she is coming into the office tomorrow and meeting with Cardio soon, we decided to push f/u with me to next month.  BP today was 118/84

## 2023-03-03 ENCOUNTER — Ambulatory Visit (INDEPENDENT_AMBULATORY_CARE_PROVIDER_SITE_OTHER): Payer: Medicare HMO | Admitting: Nurse Practitioner

## 2023-03-03 ENCOUNTER — Encounter: Payer: Self-pay | Admitting: Nurse Practitioner

## 2023-03-03 VITALS — BP 106/68 | HR 75 | Temp 96.8°F | Ht 65.0 in | Wt 178.0 lb

## 2023-03-03 DIAGNOSIS — E890 Postprocedural hypothyroidism: Secondary | ICD-10-CM | POA: Diagnosis not present

## 2023-03-03 DIAGNOSIS — E559 Vitamin D deficiency, unspecified: Secondary | ICD-10-CM | POA: Diagnosis not present

## 2023-03-03 DIAGNOSIS — I10 Essential (primary) hypertension: Secondary | ICD-10-CM | POA: Diagnosis not present

## 2023-03-03 NOTE — Progress Notes (Signed)
Subjective:  Patient ID: Victoria Pena, female    DOB: March 17, 1951  Age: 72 y.o. MRN: [id number]  Chief Complaint  Patient presents with   Hypertension    Hospital Follow up    History of present illness:   Victoria Pena comes in for ED follow-up.  She was seen at [facility] ED on February 27, 2023.  She was evaluated for substernal and left chest pain and high blood pressure 187/128.  No shortness of breath was noted.  Patient was noted to have had lumbar surgery 1 month ago.  She had a negative stress test as part of her cardiac clearance for surgery.  CTA was done to rule out PE.  No PE found.  CTA shows right paraesophageal cyst which is likely a pericardial cyst.  She is following up with cardiology next week Dr, Raliegh Ip. She is here with a one week BP which is  running normal, it is running between 100-130/70-80. She is also concerned about her thyroid medicine since she is on thyroid 125 mcg and she does not know either dose needs to be adjusted, last time her T4 was 1.9 (02/19/22). She missed her 6 weeks follow up lab TSH and T4 , she is requesting to recheck it today She also wants to know her recent vitamin D level , last time it was 22.3 from 01/25/2022, she is not taking Vitamin D but she is taking multivitamin.      03/01/2023    3:00 PM 12/30/2022    1:31 PM 12/29/2022    1:24 PM 12/29/2022    1:02 PM 12/16/2022    2:34 PM  Depression screen PHQ 2/9  Decreased Interest 1 2  0 2  Down, Depressed, Hopeless 0 0  1 2  PHQ - 2 Score '1 2  1 4  '$ Altered sleeping '1 3  1 2  '$ Tired, decreased energy '1 3  1 2  '$ Change in appetite 0 0  0 1  Feeling bad or failure about yourself  0 1  0 1  Trouble concentrating 0 1  0 1  Moving slowly or fidgety/restless 0 0  0 1  Suicidal thoughts 0 0  0 1  PHQ-9 Score '3 10  3 13  '$ Difficult doing work/chores Somewhat difficult Very difficult Somewhat difficult  Somewhat difficult         04/23/2022    2:28 PM  04/27/2022    3:08 PM 09/06/2022   10:49 AM 09/06/2022   10:51 AM 01/13/2023    4:24 PM  Fall Risk  Falls in the past year? 1 1   0  Was there an injury with Fall? 0 0   0  Fall Risk Category Calculator 2 2   0  Fall Risk Category (Retired) Moderate Moderate     (RETIRED) Patient Fall Risk Level  Moderate fall risk Low fall risk Moderate fall risk   Patient at Risk for Falls Due to  Impaired balance/gait   No Fall Risks  Fall risk Follow up  Falls prevention discussed   Falls evaluation completed;Education provided;Falls prevention discussed      Review of Systems  Constitutional:  Negative for fatigue.  HENT:  Negative for congestion, ear pain and sore throat.   Respiratory:  Negative for cough and shortness of breath.   Cardiovascular:  Negative for chest pain.  Gastrointestinal:  Negative for abdominal pain, constipation, diarrhea, nausea and vomiting.  Genitourinary:  Negative for  dysuria, frequency and urgency.  Musculoskeletal:  Negative for arthralgias, back pain and myalgias.  Neurological:  Negative for dizziness and headaches.  Psychiatric/Behavioral:  Negative for agitation and sleep disturbance. The patient is not nervous/anxious.     Current Outpatient Medications on File Prior to Visit  Medication Sig Dispense Refill   albuterol (VENTOLIN HFA) 108 (90 Base) MCG/ACT inhaler Inhale 2 puffs into the lungs every 6 (six) hours as needed for wheezing or shortness of breath. 8 g 0   ALPRAZolam (XANAX) 0.5 MG tablet TAKE 1 TABLET(0.5 MG) BY MOUTH TWICE DAILY AS NEEDED FOR ANXIETY (Patient taking differently: Take 0.5 mg by mouth 2 (two) times daily as needed for anxiety. TAKE 1 TABLET(0.5 MG) BY MOUTH TWICE DAILY AS NEEDED FOR ANXIETY) 60 tablet 3   atorvastatin (LIPITOR) 40 MG tablet TAKE 1 TABLET(40 MG) BY MOUTH DAILY (Patient taking differently: Take 40 mg by mouth daily.) 90 tablet 2   buPROPion (WELLBUTRIN XL) 150 MG 24 hr tablet Take 1 tablet (150 mg total) by mouth daily. 90  tablet 1   Calcium Carb-Cholecalciferol (CALCIUM 500 + D3) 500-5 MG-MCG TABS Take 3 tablets by mouth daily. (Patient taking differently: Take 1 tablet by mouth in the morning and at bedtime.) 100 tablet 5   calcium carbonate (TUMS - DOSED IN MG ELEMENTAL CALCIUM) 500 MG chewable tablet Chew 1 tablet by mouth 2 (two) times daily as needed for indigestion or heartburn.     cyclobenzaprine (FLEXERIL) 10 MG tablet Take 1 tablet (10 mg total) by mouth 3 (three) times daily as needed for muscle spasms. 30 tablet 3   denosumab (PROLIA) 60 MG/ML SOSY injection Inject 60 mg into the skin every 6 (six) months.     EPINEPHrine 0.3 mg/0.3 mL IJ SOAJ injection Inject 0.3 mg into the muscle as needed for anaphylaxis. 1 each 1   escitalopram (LEXAPRO) 10 MG tablet Take 1 tablet (10 mg total) by mouth daily. 90 tablet 1   fluticasone (FLONASE) 50 MCG/ACT nasal spray Place 2 sprays into both nostrils daily. (Patient taking differently: Place 2 sprays into both nostrils at bedtime.) 16 g 6   levothyroxine (SYNTHROID) 125 MCG tablet Take 1 tablet (125 mcg total) by mouth daily before breakfast. TAKE 1 TABLET(125 MCG) BY MOUTH DAILY Strength: 125 mcg 90 tablet 0   losartan (COZAAR) 50 MG tablet Take 1 tablet (50 mg total) by mouth daily. 90 tablet 1   metroNIDAZOLE (METROGEL) 0.75 % gel Apply 1 application topically 2 (two) times daily. (Patient taking differently: Apply 1 application  topically 2 (two) times daily as needed (rosacea).) 45 g 3   Multiple Vitamin (MULTIVITAMIN WITH MINERALS) TABS tablet Take 1 tablet by mouth in the morning and at bedtime.     mupirocin ointment (BACTROBAN) 2 % Apply 1 Application topically 2 (two) times daily. 22 g 0   oxyCODONE 10 MG TABS Take 1 tablet (10 mg total) by mouth every 3 (three) hours as needed for severe pain ((score 7 to 10)). 40 tablet 0   pantoprazole (PROTONIX) 40 MG tablet Take 1 tablet (40 mg total) by mouth daily. 90 tablet 2   pregabalin (LYRICA) 150 MG capsule TAKE  1 CAPSULE(150 MG) BY MOUTH TWICE DAILY (Patient taking differently: Take 150 mg by mouth 2 (two) times daily.) 60 capsule 3   Spacer/Aero-Holding Chambers DEVI 1 each by Does not apply route daily. 1 each 0   sulfamethoxazole-trimethoprim (BACTRIM DS) 800-160 MG tablet Take 1 tablet by mouth 2 (two)  times daily. 60 tablet 0   trolamine salicylate (ASPERCREME) 10 % cream Apply 1 Application topically as needed for muscle pain.     No current facility-administered medications on file prior to visit.   Past Medical History:  Diagnosis Date   Acquired spondylolisthesis 05/12/2015   Adenocarcinoma of right lung, stage 1 ([place]) 07/26/2016   Anxiety    Arthritis    Arthropathy of lumbar facet joint 04/11/2019   Formatting of this note might be different from the original. Added automatically from request for surgery [id number]   Atherosclerosis of aorta ([place]) 07/08/2021   Benign essential hypertension 12/27/2016   BMI 30.0-30.9,adult 12/23/2020   COPD (chronic obstructive pulmonary disease) ([place])    pt denies   Current mild episode of major depressive disorder ([place]) 03/24/2020   Daytime sleepiness 08/03/2021   Depression    Fibromyalgia 03/24/2020   Gastroesophageal reflux disease without esophagitis 03/24/2020   Graves disease 12/27/2016   Hypertension    Osteoporosis 08/03/2021   Pericardial cyst 02/02/2021   Postoperative hypothyroidism 01/17/2015   Reactive airways dysfunction syndrome, unspecified asthma severity, uncomplicated ([place]) 0000000   S/P lobectomy of lung 07/26/2015   Formatting of this note might be different from the original. Right upper   S/P lumbar spinal fusion 05/11/2018   Thyroid disease    Toe ulcer, left, limited to breakdown of skin ([place]) 12/23/2020   Past Surgical History:  Procedure Laterality Date   ANKLE SURGERY     bilateral   APPENDECTOMY     BREAST BIOPSY Left 02/25/2020   BREAST BIOPSY Right 08/17/2018   BREAST EXCISIONAL BIOPSY Right    unsure  when but marked with scar marker   CHOLECYSTECTOMY     EYE MUSCLE SURGERY Bilateral    4-5 years ago   LAMINECTOMY WITH POSTERIOR LATERAL ARTHRODESIS LEVEL 2 N/A 01/21/2023   Procedure: Laminectomy  - L1-L2 - L2-L3 with non-instrumented posterior lateral fusion;  Surgeon: Eustace Moore, MD;  Location: [place];  Service: Neurosurgery;  Laterality: N/A;   LUMBAR FUSION  2019   L4, 5, 6   REPLACEMENT TOTAL KNEE Left    THYROIDECTOMY      Family History  Problem Relation Age of Onset   Cancer Mother    Cancer Father    Cancer Brother    Autism Grandson    Breast cancer Neg Hx    Social History   Socioeconomic History   Marital status: Widowed    Spouse name: Not on file   Number of children: Not on file   Years of education: Not on file   Highest education level: Not on file  Occupational History   Not on file  Tobacco Use   Smoking status: Former    Packs/day: 1.50    Years: 35.00    Additional pack years: 0.00    Total pack years: 52.50    Types: Cigarettes    Quit date: 07/27/1999    Years since quitting: 23.6   Smokeless tobacco: Never  Vaping Use   Vaping Use: Never used  Substance and Sexual Activity   Alcohol use: No   Drug use: No   Sexual activity: Not Currently  Other Topics Concern   Not on file  Social History Narrative   Not on file   Social Determinants of Health   Financial Resource Strain: High Risk (01/13/2023)   Overall Financial Resource Strain (CARDIA)    Difficulty of Paying Living Expenses: Hard  Food Insecurity: Food Insecurity Present (01/13/2023)  Hunger Vital Sign    Worried About Running Out of Food in the Last Year: Often true    Ran Out of Food in the Last Year: Often true  Transportation Needs: No Transportation Needs (01/13/2023)   PRAPARE - Hydrologist (Medical): No    Lack of Transportation (Non-Medical): No  Physical Activity: Insufficiently Active (12/08/2022)   Exercise Vital Sign    Days of  Exercise per Week: 1 day    Minutes of Exercise per Session: 10 min  Stress: Stress Concern Present (01/13/2023)   [place]    Feeling of Stress : Rather much  Social Connections: Socially Isolated (01/13/2023)   Social Connection and Isolation Panel [NHANES]    Frequency of Communication with Friends and Family: More than three times a week    Frequency of Social Gatherings with Friends and Family: Twice a week    Attends Religious Services: Never    Marine scientist or Organizations: No    Attends Archivist Meetings: Never    Marital Status: Widowed    Objective:  BP 106/68 (BP Location: Left Arm, Patient Position: Sitting, Cuff Size: Normal)   Pulse 75   Temp (!) 96.8 F (36 C) (Temporal)   Ht '5\' 5"'$  (1.651 m)   Wt 178 lb (80.7 kg)   LMP  (LMP Unknown)   SpO2 96%   BMI 29.62 kg/m      03/03/2023    1:54 PM 01/22/2023    7:28 AM 01/22/2023    4:45 AM  BP/Weight  Systolic BP A999333 [id number]  Diastolic BP 68 76 85  Wt. (Lbs) 178    BMI 29.62 kg/m2      Physical Exam Vitals reviewed.  Constitutional:      Appearance: Normal appearance.  Cardiovascular:     Rate and Rhythm: Normal rate and regular rhythm.  Pulmonary:     Effort: Pulmonary effort is normal.     Breath sounds: Normal breath sounds.  Abdominal:     General: Bowel sounds are normal.  Musculoskeletal:        General: Signs of injury (surgical scar on back) present. Normal range of motion.     Cervical back: Normal range of motion.     Comments: Using cane to walk  Skin:    General: Skin is warm.  Neurological:     Mental Status: She is alert.  Psychiatric:        Mood and Affect: Mood normal.        Behavior: Behavior normal.     Lab Results  Component Value Date   WBC 5.4 12/31/2022   HGB 12.2 12/31/2022   HCT 38.5 12/31/2022   PLT 233 12/31/2022   GLUCOSE 94 12/31/2022   CHOL 154 10/12/2022   TRIG 67 10/12/2022    HDL 82 10/12/2022   LDLCALC 59 10/12/2022   ALT 14 10/12/2022   AST 14 10/12/2022   NA 139 12/31/2022   K 4.1 12/31/2022   CL 107 12/31/2022   CREATININE 0.77 12/31/2022   BUN 15 12/31/2022   CO2 22 12/31/2022   TSH 2.920 08/10/2022   INR 1.0 12/31/2022      Assessment & Plan:    Benign essential hypertension Assessment & Plan: Continue Losartan 50 mg OD Continue checking BP at home Continue eating low salt diet and exercise as tolerable  Nutrition: Stressed importance of moderation in sodium intake, saturated  fat and cholesterol, caloric balance, sufficient intake of complex carbohydrates, fiber, calcium and iron.   Exercise: Stressed the importance of regular exercise.      Vitamin D deficiency disease Assessment & Plan: Vitamin D level will be checked today  Encourage to eat vitamin rich diets  Orders: -     VITAMIN D 25 Hydroxy (Vit-D Deficiency, Fractures)  Postoperative hypothyroidism Assessment & Plan: TSH and free T4 level will be checked today Continue synthroid 125 mcg   Orders: -     T4, free -     TSH    Orders Placed This Encounter  Procedures   T4, free   TSH   Vitamin D, 25-hydroxy     Follow-up: Return in about 3 months (around 06/03/2023) for CHRONIC, FASTING.  An After Visit Summary was printed and given to the patient. I, Neil Crouch have reviewed all documentation for this visit. The documentation on 03/03/23   for the exam, diagnosis, procedures, and orders are all accurate and complete.    Neil Crouch, DNP, [facility] [phone]

## 2023-03-03 NOTE — Assessment & Plan Note (Signed)
Vitamin D level will be checked today  Encourage to eat vitamin rich diets

## 2023-03-03 NOTE — Patient Instructions (Signed)
Follow up in 3 months Will call you regarding the lab  Continue the same medicine  DASH Eating Plan DASH stands for Dietary Approaches to Stop Hypertension. The DASH eating plan is a healthy eating plan that has been shown to: Reduce high blood pressure (hypertension). Reduce your risk for type 2 diabetes, heart disease, and stroke. Help with weight loss. What are tips for following this plan? Reading food labels Check food labels for the amount of salt (sodium) per serving. Choose foods with less than 5 percent of the Daily Value of sodium. Generally, foods with less than 300 milligrams (mg) of sodium per serving fit into this eating plan. To find whole grains, look for the word "whole" as the first word in the ingredient list. Shopping Buy products labeled as "low-sodium" or "no salt added." Buy fresh foods. Avoid canned foods and pre-made or frozen meals. Cooking Avoid adding salt when cooking. Use salt-free seasonings or herbs instead of table salt or sea salt. Check with your health care provider or pharmacist before using salt substitutes. Do not fry foods. Cook foods using healthy methods such as baking, boiling, grilling, roasting, and broiling instead. Cook with heart-healthy oils, such as olive, canola, avocado, soybean, or sunflower oil. Meal planning  Eat a balanced diet that includes: 4 or more servings of fruits and 4 or more servings of vegetables each day. Try to fill one-half of your plate with fruits and vegetables. 6-8 servings of whole grains each day. Less than 6 oz (170 g) of lean meat, poultry, or fish each day. A 3-oz (85-g) serving of meat is about the same size as a deck of cards. One egg equals 1 oz (28 g). 2-3 servings of low-fat dairy each day. One serving is 1 cup (237 mL). 1 serving of nuts, seeds, or beans 5 times each week. 2-3 servings of heart-healthy fats. Healthy fats called omega-3 fatty acids are found in foods such as walnuts, flaxseeds, fortified  milks, and eggs. These fats are also found in cold-water fish, such as sardines, salmon, and mackerel. Limit how much you eat of: Canned or prepackaged foods. Food that is high in trans fat, such as some fried foods. Food that is high in saturated fat, such as fatty meat. Desserts and other sweets, sugary drinks, and other foods with added sugar. Full-fat dairy products. Do not salt foods before eating. Do not eat more than 4 egg yolks a week. Try to eat at least 2 vegetarian meals a week. Eat more home-cooked food and less restaurant, buffet, and fast food. Lifestyle When eating at a restaurant, ask that your food be prepared with less salt or no salt, if possible. If you drink alcohol: Limit how much you use to: 0-1 drink a day for women who are not pregnant. 0-2 drinks a day for men. Be aware of how much alcohol is in your drink. In the [place] one drink equals one 12 oz bottle of beer (355 mL), one 5 oz glass of wine (148 mL), or one 1 oz glass of hard liquor (44 mL). General information Avoid eating more than 2,300 mg of salt a day. If you have hypertension, you may need to reduce your sodium intake to 1,500 mg a day. Work with your health care provider to maintain a healthy body weight or to lose weight. Ask what an ideal weight is for you. Get at least 30 minutes of exercise that causes your heart to beat faster (aerobic exercise) most days of the  week. Activities may include walking, swimming, or biking. Work with your health care provider or dietitian to adjust your eating plan to your individual calorie needs. What foods should I eat? Fruits All fresh, dried, or frozen fruit. Canned fruit in natural juice (without added sugar). Vegetables Fresh or frozen vegetables (raw, steamed, roasted, or grilled). Low-sodium or reduced-sodium tomato and vegetable juice. Low-sodium or reduced-sodium tomato sauce and tomato paste. Low-sodium or reduced-sodium canned  vegetables. Grains Whole-grain or whole-wheat bread. Whole-grain or whole-wheat pasta. Brown rice. [place]. Bulgur. Whole-grain and low-sodium cereals. Pita bread. Low-fat, low-sodium crackers. Whole-wheat flour tortillas. Meats and other proteins Skinless chicken or [place]. Ground chicken or [place]. Pork with fat trimmed off. Fish and seafood. Egg whites. Dried beans, peas, or lentils. Unsalted nuts, nut butters, and seeds. Unsalted canned beans. Lean cuts of beef with fat trimmed off. Low-sodium, lean precooked or cured meat, such as sausages or meat loaves. Dairy Low-fat (1%) or fat-free (skim) milk. Reduced-fat, low-fat, or fat-free cheeses. Nonfat, low-sodium ricotta or cottage cheese. Low-fat or nonfat yogurt. Low-fat, low-sodium cheese. Fats and oils Soft margarine without trans fats. Vegetable oil. Reduced-fat, low-fat, or light mayonnaise and salad dressings (reduced-sodium). Canola, safflower, olive, avocado, soybean, and sunflower oils. Avocado. Seasonings and condiments Herbs. Spices. Seasoning mixes without salt. Other foods Unsalted popcorn and pretzels. Fat-free sweets. The items listed above may not be a complete list of foods and beverages you can eat. Contact a dietitian for more information. What foods should I avoid? Fruits Canned fruit in a light or heavy syrup. Fried fruit. Fruit in cream or butter sauce. Vegetables Creamed or fried vegetables. Vegetables in a cheese sauce. Regular canned vegetables (not low-sodium or reduced-sodium). Regular canned tomato sauce and paste (not low-sodium or reduced-sodium). Regular tomato and vegetable juice (not low-sodium or reduced-sodium). Angie Fava. Olives. Grains Baked goods made with fat, such as croissants, muffins, or some breads. Dry pasta or rice meal packs. Meats and other proteins Fatty cuts of meat. Ribs. Fried meat. Berniece Salines. Bologna, salami, and other precooked or cured meats, such as sausages or meat loaves. Fat from  the back of a pig (fatback). Bratwurst. Salted nuts and seeds. Canned beans with added salt. Canned or smoked fish. Whole eggs or egg yolks. Chicken or [place] with skin. Dairy Whole or 2% milk, cream, and half-and-half. Whole or full-fat cream cheese. Whole-fat or sweetened yogurt. Full-fat cheese. Nondairy creamers. Whipped toppings. Processed cheese and cheese spreads. Fats and oils Butter. Stick margarine. Lard. Shortening. Ghee. Bacon fat. Tropical oils, such as coconut, palm kernel, or palm oil. Seasonings and condiments Onion salt, garlic salt, seasoned salt, table salt, and sea salt. Worcestershire sauce. Tartar sauce. Barbecue sauce. Teriyaki sauce. Soy sauce, including reduced-sodium. Steak sauce. Canned and packaged gravies. Fish sauce. Oyster sauce. Cocktail sauce. Store-bought horseradish. Ketchup. Mustard. Meat flavorings and tenderizers. Bouillon cubes. Hot sauces. Pre-made or packaged marinades. Pre-made or packaged taco seasonings. Relishes. Regular salad dressings. Other foods Salted popcorn and pretzels. The items listed above may not be a complete list of foods and beverages you should avoid. Contact a dietitian for more information. Where to find more information National Heart, Lung, and Blood Institute: https://wilson-eaton.com/ American Heart Association: www.heart.org Academy of Nutrition and Dietetics: www.eatright.[place]: www.kidney.org Summary The DASH eating plan is a healthy eating plan that has been shown to reduce high blood pressure (hypertension). It may also reduce your risk for type 2 diabetes, heart disease, and stroke. When on the DASH eating plan, aim to eat  more fresh fruits and vegetables, whole grains, lean proteins, low-fat dairy, and heart-healthy fats. With the DASH eating plan, you should limit salt (sodium) intake to 2,300 mg a day. If you have hypertension, you may need to reduce your sodium intake to 1,500 mg a day. Work with your  health care provider or dietitian to adjust your eating plan to your individual calorie needs. This information is not intended to replace advice given to you by your health care provider. Make sure you discuss any questions you have with your health care provider. Document Revised: 11/09/2019 Document Reviewed: 11/09/2019 Elsevier Patient Education  [place].

## 2023-03-03 NOTE — Assessment & Plan Note (Signed)
TSH and free T4 level will be checked today Continue synthroid 125 mcg

## 2023-03-03 NOTE — Assessment & Plan Note (Signed)
Continue Losartan 50 mg OD Continue checking BP at home Continue eating low salt diet and exercise as tolerable  Nutrition: Stressed importance of moderation in sodium intake, saturated fat and cholesterol, caloric balance, sufficient intake of complex carbohydrates, fiber, calcium and iron.   Exercise: Stressed the importance of regular exercise.

## 2023-03-04 LAB — VITAMIN D 25 HYDROXY (VIT D DEFICIENCY, FRACTURES): Vit D, 25-Hydroxy: 23.4 ng/mL — ABNORMAL LOW (ref 30.0–100.0)

## 2023-03-04 LAB — TSH: TSH: 1.57 u[IU]/mL (ref 0.450–4.500)

## 2023-03-04 LAB — T4, FREE: Free T4: 1.48 ng/dL (ref 0.82–1.77)

## 2023-03-08 DIAGNOSIS — M48062 Spinal stenosis, lumbar region with neurogenic claudication: Secondary | ICD-10-CM | POA: Diagnosis not present

## 2023-03-08 NOTE — Addendum Note (Signed)
Addended byRochel Brome on: 03/08/2023 07:24 PM   Modules accepted: Level of Service

## 2023-03-10 ENCOUNTER — Ambulatory Visit (INDEPENDENT_AMBULATORY_CARE_PROVIDER_SITE_OTHER): Payer: Medicare HMO

## 2023-03-10 ENCOUNTER — Telehealth: Payer: Medicare HMO

## 2023-03-10 DIAGNOSIS — F418 Other specified anxiety disorders: Secondary | ICD-10-CM

## 2023-03-10 DIAGNOSIS — F419 Anxiety disorder, unspecified: Secondary | ICD-10-CM

## 2023-03-10 DIAGNOSIS — J432 Centrilobular emphysema: Secondary | ICD-10-CM

## 2023-03-10 DIAGNOSIS — I10 Essential (primary) hypertension: Secondary | ICD-10-CM

## 2023-03-10 NOTE — Patient Instructions (Addendum)
Please call the care guide team at [phone] if you need to cancel or reschedule your appointment.   If you are experiencing a Mental Health or [place] or need someone to talk to, please call the Suicide and Crisis Lifeline: 988 call the [place] National Suicide Prevention Lifeline: 425 629 0677 or TTY: (581)236-5485 TTY 415-798-1563) to talk to a trained counselor call 1-800-273-TALK (toll free, 24 hour hotline) go to [facility] [place] [phone])   Following is a copy of the CCM Program Consent:  CCM service includes personalized support from designated clinical staff supervised by the physician, including individualized plan of care and coordination with other care providers 24/7 contact phone numbers for assistance for urgent and routine care needs. Service will only be billed when office clinical staff spend 20 minutes or more in a month to coordinate care. Only one practitioner may furnish and bill the service in a calendar month. The patient may stop CCM services at amy time (effective at the end of the month) by phone call to the office staff. The patient will be responsible for cost sharing (co-pay) or up to 20% of the service fee (after annual deductible is met)  Following is a copy of your full provider care plan:   Goals Addressed             This Visit's Progress    CCM Expected Outcome:  Monitor, Self-Manage and Reduce Symptoms of: Depression, Anxiety, Stress       Current Barriers:  Care Coordination needs related to resources in the community for help with mental health needs and community resources for financial support in a patient with anxiety, depression, and stress Chronic Disease Management support and education needs related to effective management of stress, anxiety, and depression  Planned Interventions: Evaluation of current treatment plan related to stress, anxiety, and depression  and patient's adherence to plan as established by provider. The patient is having a good day today. The patient states she is doing well and feels her depression is stable. She does not think she will be able to go back to work but she states that she would like to do something to help get her some extra income. She is going to do some small quilting projects and her son got her a kit called [employer]. She received her first package today and she is excited about this. She knows she has to pace her activity and not over do it. Education given and encouraged her to do things she enjoys ad know her limitations. Advised patient to call the office for changes in stress, anxiety, and depression Provided education to patient re: working with the CCM team, support available with resources, ongoing outreaches for effective management of stress, anxiety, and depression Reviewed medications with patient and discussed compliance. The patient is compliant with her medications. Denies any medication needs at this time. Works with the pharm D on effective medication management. Reviewed scheduled/upcoming provider appointments including 06-06-2023 at 2 pm Care Guide referral for resources in the community to help with food resources and financial constraints. Has been provided with information. The patient received information from the care guides but she states that none of those programs can actually help her. Will continue to monitor for changes and new programs that may be a resource for the patient.  Social Work referral for ongoing support and education related to effective management of depression, anxiety, stress. The patient has talked with the LCSW.  The patient is feeling some better. She knows she needs to take things slow. She knows she cannot return to the work she previously did. The patient knows the care team is available to assist with needs.  Pharmacy referral for ongoing support and education of  medication needs and management. Ongoing support and education from the pharm D. Discussed plans with patient for ongoing care management follow up and provided patient with direct contact information for care management team Advised patient to discuss changes in mood, anxiety, depression, or mental health needs with provider Screening for signs and symptoms of depression related to chronic disease state  Assessed social determinant of health barriers Discussed self care and doing hobbies that make the patient happy or going on events that do not cost money, or doing things with family and friends.  The patient knows to call the [facility] in between outreaches for new needs or concerns.   Symptom Management: Take medications as prescribed   Attend all scheduled provider appointments Call provider office for new concerns or questions  call the Suicide and Crisis Lifeline: 988 call the [place] National Suicide Prevention Lifeline: 385 439 5246 or TTY: 636 455 1512 TTY [phone]) to talk to a trained counselor call 1-800-273-TALK (toll free, 24 hour hotline) if experiencing a Mental Health or [place]   Follow Up Plan: Telephone follow up appointment with care management team member scheduled for: 04-15-2023 at 345 pm        CCM Expected Outcome:  Monitor, Self-Manage, and Reduce Symptoms of Hypertension       Current Barriers:  Chronic Disease Management support and education needs related to effective management of HTN BP Readings from Last 3 Encounters:  03/03/23 106/68  01/22/23 125/76  01/13/23 114/72    Planned Interventions: Evaluation of current treatment plan related to hypertension self management and patient's adherence to plan as established by provider. Her blood pressures are stable and she is doing well post surgery for lumbar radiculopathy. She did have an incidence of HTN with CP and was evaluated in the ER. Everything checked out good. 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 She had elevations of blood pressures: 185/115, 187/125 ND 187/128. She states since that night she has had lower blood pressure readings and they have been 118/70 and 102/70. She keeps a record. Education and support given. ;   Provided education to patient re: stroke prevention, s/s of heart attack and stroke; Reviewed prescribed diet heart healthy diet. The patient is compliant with a heart healthy diet.  Reviewed medications with patient and discussed importance of compliance. The patient is compliant with medications. Works with the [place] [facility] on an ongoing basis;  Discussed plans with patient for ongoing care management follow up and provided patient with direct contact information for care management team; Advised patient, providing education and rationale, to monitor blood pressure daily and record, calling PCP for findings outside established parameters;  Reviewed scheduled/upcoming provider appointments including: 06-06-2023 at 2 pm Advised patient to discuss changes in HTN or heart health with provider; Provided education on prescribed diet heart healthy ;  Discussed complications of poorly controlled blood pressure such as heart disease, stroke, circulatory complications, vision complications, kidney impairment, sexual dysfunction;  Screening for signs and symptoms of depression related to chronic disease state;  Assessed social determinant of health barriers;   Symptom Management: Take medications as prescribed   Attend all scheduled provider appointments Call provider office for new concerns or questions  call the  Suicide and Crisis Lifeline: 988 call the [place] National Suicide Prevention Lifeline: 862 606 0839 or TTY: 201 326 6045 TTY 930-627-3086) to talk to a trained counselor call 1-800-273-TALK (toll free, 24 hour hotline) if experiencing a Mental Health or [place]  check blood pressure weekly learn about high blood pressure call  doctor for signs and symptoms of high blood pressure develop an action plan for high blood pressure keep all doctor appointments take medications for blood pressure exactly as prescribed report new symptoms to your doctor  Follow Up Plan: Telephone follow up appointment with care management team member scheduled for: 04-15-2023 at 345 pm       CCM:  Maintain, Monitor and Self-Manage Symptoms of COPD       Current Barriers:  Chronic Disease Management support and education needs related to effective COPD  Planned Interventions: Provided patient with basic written and verbal COPD education on self care/management/and exacerbation prevention. The patient is doing well post surgery on 01-21-2023 for a lumbar radiculopathy. She denies any new issues with her breathing at this time. Denies any issues with her breathing at this time. Knows how to effectively management Advised patient to track and manage COPD triggers. Is aware of things that exacerbate her condition. Provided written and verbal instructions on pursed lip breathing and utilized returned demonstration as teach back Provided instruction about proper use of medications used for management of COPD including inhalers Advised patient to self assesses COPD action plan zone and make appointment with provider if in the yellow zone for 48 hours without improvement Advised patient to engage in light exercise as tolerated 3-5 days a week to aid in the the management of COPD Provided education about and advised patient to utilize infection prevention strategies to reduce risk of respiratory infection. The patient is mindful of her surroundings and is safe. The patient knows what worse looks like and to call for changes or needs.  Discussed the importance of adequate rest and management of fatigue with COPD Screening for signs and symptoms of depression related to chronic disease state  Assessed social determinant of health barriers  Symptom  Management: Take medications as prescribed   Attend all scheduled provider appointments Call provider office for new concerns or questions  call the Suicide and Crisis Lifeline: 988 call the [place] National Suicide Prevention Lifeline: 364-287-9886 or TTY: 845 300 7233 TTY 484-686-0443) to talk to a trained counselor call 1-800-273-TALK (toll free, 24 hour hotline) if experiencing a Mental Health or [place]  identify and remove indoor air pollutants limit outdoor activity during cold weather listen for public air quality announcements every day develop a rescue plan eliminate symptom triggers at home follow rescue plan if symptoms flare-up  Follow Up Plan: Telephone follow up appointment with care management team member scheduled for: 04-15-2023 and 345 pm          Patient verbalizes understanding of instructions and care plan provided today and agrees to view in [employer]. Active MyChart status and patient understanding of how to access instructions and care plan via MyChart confirmed with patient.     Telephone follow up appointment with care management team member scheduled for: 04-15-2023 at 345 pm

## 2023-03-10 NOTE — Chronic Care Management (AMB) (Signed)
Chronic Care Management   CCM RN Visit Note  03/10/2023 Name: Victoria Pena MRN: [id number] DOB: 1951-09-17  Subjective: Victoria Pena is a 72 y.o. year old female who is a primary care patient of Neil Crouch, FNP. The patient was referred to the Chronic Care Management team for assistance with care management needs subsequent to provider initiation of CCM services and plan of care.    Today's Visit:  Engaged with patient by telephone for follow up visit.        Goals Addressed             This Visit's Progress    CCM Expected Outcome:  Monitor, Self-Manage and Reduce Symptoms of: Depression, Anxiety, Stress       Current Barriers:  Care Coordination needs related to resources in the community for help with mental health needs and community resources for financial support in a patient with anxiety, depression, and stress Chronic Disease Management support and education needs related to effective management of stress, anxiety, and depression  Planned Interventions: Evaluation of current treatment plan related to stress, anxiety, and depression and patient's adherence to plan as established by provider. The patient is having a good day today. The patient states she is doing well and feels her depression is stable. She does not think she will be able to go back to work but she states that she would like to do something to help get her some extra income. She is going to do some small quilting projects and her son got her a kit called [employer]. She received her first package today and she is excited about this. She knows she has to pace her activity and not over do it. Education given and encouraged her to do things she enjoys ad know her limitations. Advised patient to call the office for changes in stress, anxiety, and depression Provided education to patient re: working with the CCM team, support available with resources, ongoing outreaches for effective management of stress,  anxiety, and depression Reviewed medications with patient and discussed compliance. The patient is compliant with her medications. Denies any medication needs at this time. Works with the pharm D on effective medication management. Reviewed scheduled/upcoming provider appointments including 06-06-2023 at 2 pm Care Guide referral for resources in the community to help with food resources and financial constraints. Has been provided with information. The patient received information from the care guides but she states that none of those programs can actually help her. Will continue to monitor for changes and new programs that may be a resource for the patient.  Social Work referral for ongoing support and education related to effective management of depression, anxiety, stress. The patient has talked with the LCSW. The patient is feeling some better. She knows she needs to take things slow. She knows she cannot return to the work she previously did. The patient knows the care team is available to assist with needs.  Pharmacy referral for ongoing support and education of medication needs and management. Ongoing support and education from the pharm D. Discussed plans with patient for ongoing care management follow up and provided patient with direct contact information for care management team Advised patient to discuss changes in mood, anxiety, depression, or mental health needs with provider Screening for signs and symptoms of depression related to chronic disease state  Assessed social determinant of health barriers Discussed self care and doing hobbies that make the patient happy or going on events that do not cost  money, or doing things with family and friends.  The patient knows to call the [facility] in between outreaches for new needs or concerns.   Symptom Management: Take medications as prescribed   Attend all scheduled provider appointments Call provider office for new concerns or questions  call the  Suicide and Crisis Lifeline: 988 call the [place] National Suicide Prevention Lifeline: (470) 702-7284 or TTY: 912-459-6077 TTY 570-754-0806) to talk to a trained counselor call 1-800-273-TALK (toll free, 24 hour hotline) if experiencing a Mental Health or [place]   Follow Up Plan: Telephone follow up appointment with care management team member scheduled for: 04-15-2023 at 345 pm        CCM Expected Outcome:  Monitor, Self-Manage, and Reduce Symptoms of Hypertension       Current Barriers:  Chronic Disease Management support and education needs related to effective management of HTN BP Readings from Last 3 Encounters:  03/03/23 106/68  01/22/23 125/76  01/13/23 114/72    Planned Interventions: Evaluation of current treatment plan related to hypertension self management and patient's adherence to plan as established by provider. Her blood pressures are stable and she is doing well post surgery for lumbar radiculopathy. She did have an incidence of HTN with CP and was evaluated in the ER. Everything checked out good. She does have a sternal cyst and this may be growing. She had elevations of blood pressures: 185/115, 187/125 ND 187/128. She states since that night she has had lower blood pressure readings and they have been 118/70 and 102/70. She keeps a record. Education and support given. ;   Provided education to patient re: stroke prevention, s/s of heart attack and stroke; Reviewed prescribed diet heart healthy diet. The patient is compliant with a heart healthy diet.  Reviewed medications with patient and discussed importance of compliance. The patient is compliant with medications. Works with the [place] D on an ongoing basis;  Discussed plans with patient for ongoing care management follow up and provided patient with direct contact information for care management team; Advised patient, providing education and rationale, to monitor blood pressure daily and record, calling PCP  for findings outside established parameters;  Reviewed scheduled/upcoming provider appointments including: 06-06-2023 at 2 pm Advised patient to discuss changes in HTN or heart health with provider; Provided education on prescribed diet heart healthy ;  Discussed complications of poorly controlled blood pressure such as heart disease, stroke, circulatory complications, vision complications, kidney impairment, sexual dysfunction;  Screening for signs and symptoms of depression related to chronic disease state;  Assessed social determinant of health barriers;   Symptom Management: Take medications as prescribed   Attend all scheduled provider appointments Call provider office for new concerns or questions  call the Suicide and Crisis Lifeline: 988 call the [place] National Suicide Prevention Lifeline: 612-703-1026 or TTY: 704-417-0670 TTY (712)669-2927) to talk to a trained counselor call 1-800-273-TALK (toll free, 24 hour hotline) if experiencing a Mental Health or [place]  check blood pressure weekly learn about high blood pressure call doctor for signs and symptoms of high blood pressure develop an action plan for high blood pressure keep all doctor appointments take medications for blood pressure exactly as prescribed report new symptoms to your doctor  Follow Up Plan: Telephone follow up appointment with care management team member scheduled for: 04-15-2023 at 345 pm       CCM:  Maintain, Monitor and Self-Manage Symptoms of COPD       Current Barriers:  Chronic Disease Management  support and education needs related to effective COPD  Planned Interventions: Provided patient with basic written and verbal COPD education on self care/management/and exacerbation prevention. The patient is doing well post surgery on 01-21-2023 for a lumbar radiculopathy. She denies any new issues with her breathing at this time. Denies any issues with her breathing at this time. Knows how to  effectively management Advised patient to track and manage COPD triggers. Is aware of things that exacerbate her condition. Provided written and verbal instructions on pursed lip breathing and utilized returned demonstration as teach back Provided instruction about proper use of medications used for management of COPD including inhalers Advised patient to self assesses COPD action plan zone and make appointment with provider if in the yellow zone for 48 hours without improvement Advised patient to engage in light exercise as tolerated 3-5 days a week to aid in the the management of COPD Provided education about and advised patient to utilize infection prevention strategies to reduce risk of respiratory infection. The patient is mindful of her surroundings and is safe. The patient knows what worse looks like and to call for changes or needs.  Discussed the importance of adequate rest and management of fatigue with COPD Screening for signs and symptoms of depression related to chronic disease state  Assessed social determinant of health barriers  Symptom Management: Take medications as prescribed   Attend all scheduled provider appointments Call provider office for new concerns or questions  call the Suicide and Crisis Lifeline: 988 call the [place] National Suicide Prevention Lifeline: 414-829-0204 or TTY: 727-210-4612 TTY 310-549-9605) to talk to a trained counselor call 1-800-273-TALK (toll free, 24 hour hotline) if experiencing a Mental Health or [place]  identify and remove indoor air pollutants limit outdoor activity during cold weather listen for public air quality announcements every day develop a rescue plan eliminate symptom triggers at home follow rescue plan if symptoms flare-up  Follow Up Plan: Telephone follow up appointment with care management team member scheduled for: 04-15-2023 and 345 pm          Plan:Telephone follow up appointment with care  management team member scheduled for:  04-15-2023 at 40 pm   [place], MSN, CCM RN Care Manager  Chronic Care Management Direct Number: [phone]

## 2023-03-11 ENCOUNTER — Ambulatory Visit: Payer: Medicare HMO | Admitting: Cardiology

## 2023-03-11 ENCOUNTER — Other Ambulatory Visit: Payer: Self-pay | Admitting: Physician Assistant

## 2023-03-11 DIAGNOSIS — C3491 Malignant neoplasm of unspecified part of right bronchus or lung: Secondary | ICD-10-CM

## 2023-03-14 ENCOUNTER — Telehealth: Payer: Self-pay | Admitting: Physician Assistant

## 2023-03-14 DIAGNOSIS — Z981 Arthrodesis status: Secondary | ICD-10-CM | POA: Diagnosis not present

## 2023-03-14 DIAGNOSIS — M48062 Spinal stenosis, lumbar region with neurogenic claudication: Secondary | ICD-10-CM | POA: Diagnosis not present

## 2023-03-14 NOTE — Progress Notes (Signed)
This encounter was created in error - please disregard.

## 2023-03-14 NOTE — Telephone Encounter (Signed)
Cancelled 03/26 and 03/28 appointments per 03/22 scheduled messages, patient has been called and voicemail was left regarding cancellation of appointments.

## 2023-03-15 ENCOUNTER — Inpatient Hospital Stay: Payer: Medicare HMO

## 2023-03-15 ENCOUNTER — Ambulatory Visit (HOSPITAL_COMMUNITY): Payer: Medicare HMO

## 2023-03-17 ENCOUNTER — Inpatient Hospital Stay: Payer: Medicare HMO | Admitting: Physician Assistant

## 2023-03-20 DIAGNOSIS — I10 Essential (primary) hypertension: Secondary | ICD-10-CM

## 2023-03-20 DIAGNOSIS — J449 Chronic obstructive pulmonary disease, unspecified: Secondary | ICD-10-CM

## 2023-03-20 DIAGNOSIS — F32A Depression, unspecified: Secondary | ICD-10-CM

## 2023-03-25 DIAGNOSIS — M25572 Pain in left ankle and joints of left foot: Secondary | ICD-10-CM | POA: Diagnosis not present

## 2023-03-25 DIAGNOSIS — S93401A Sprain of unspecified ligament of right ankle, initial encounter: Secondary | ICD-10-CM | POA: Diagnosis not present

## 2023-03-25 DIAGNOSIS — M79672 Pain in left foot: Secondary | ICD-10-CM | POA: Diagnosis not present

## 2023-03-25 DIAGNOSIS — S93402A Sprain of unspecified ligament of left ankle, initial encounter: Secondary | ICD-10-CM | POA: Diagnosis not present

## 2023-03-25 DIAGNOSIS — W19XXXA Unspecified fall, initial encounter: Secondary | ICD-10-CM | POA: Diagnosis not present

## 2023-03-31 ENCOUNTER — Ambulatory Visit: Payer: Self-pay | Admitting: *Deleted

## 2023-04-01 NOTE — Patient Outreach (Signed)
  Care Coordination   Follow Up Visit Note   04/01/2023 Name: Victoria Pena MRN: [id number] DOB: November 24, 1951  Victoria Pena is a 72 y.o. year old female who sees Aryal, Lonia Farber, FNP (Inactive) for primary care. I spoke with  Marko Stai Panebianco by phone today.  What matters to the patients health and wellness today?  "Helping with grandkids- my ex-husband is in hospital"    Goals Addressed               This Visit's Progress     Patient Stated she feels overwhelmed sometimes and has depresion issues. She has challenges with meeting daily care needs (pt-stated)        Interventions: Follow up call to pt today who reports recovering from lumbar/back surgery- now wearing a spine stimulator-  Also having hip pain and may go see an Orthopedist.   Pt remains on Lexapro for depression. Pt previously shared struggling with finances since stopping her job in November feeling it was unsafe health wise and physically (she was an Engineer, production for [employer]). She now reports she has been able to get some bills paid off and that is helping her stress. She is unsure about pursuing a part time job that would be physically comfortable/or accommodating due to being on Social Security and limits there- as well as partial Medicaid she receives. She also reports getting Charity fundraiser.      Pt appreciative and agreeable to follow up call 4/11//24          SDOH assessments and interventions completed:  Yes     Care Coordination Interventions:  Yes, provided  Interventions Today    Flowsheet Row Most Recent Value  General Interventions   General Interventions Discussed/Reviewed Community Resources  Mental Health Interventions   Mental Health Discussed/Reviewed Depression, Coping Strategies, Mental Health Discussed  [coping well/accepting where I am]       Follow up plan: Follow up call scheduled for 04/14/23    Encounter Outcome:  Pt. Visit Completed

## 2023-04-01 NOTE — Patient Instructions (Signed)
Visit Information  Thank you for taking time to visit with me today. Please don't hesitate to contact me if I can be of assistance to you.   Following are the goals we discussed today:   Goals Addressed               This Visit's Progress     Patient Stated she feels overwhelmed sometimes and has depresion issues. She has challenges with meeting daily care needs (pt-stated)        Interventions: Follow up call to pt today who reports recovering from lumbar/back surgery- now wearing a spine stimulator-  Also having hip pain and may go see an Orthopedist.   Pt remains on Lexapro for depression. Pt previously shared struggling with finances since stopping her job in November feeling it was unsafe health wise and physically (she was an Engineer, production for [employer]). She now reports she has been able to get some bills paid off and that is helping her stress. She is unsure about pursuing a part time job that would be physically comfortable/or accommodating due to being on Social Security and limits there- as well as partial Medicaid she receives. She also reports getting Charity fundraiser.      Pt appreciative and agreeable to follow up call 4/11//24          Our next appointment is by telephone on 04/14/23  Please call the care guide team at [phone] if you need to cancel or reschedule your appointment.   If you are experiencing a Mental Health or Behavioral Health Crisis or need someone to talk to, please call the Suicide and Crisis Lifeline: 988 call 911   The patient verbalized understanding of instructions, educational materials, and care plan provided today and DECLINED offer to receive copy of patient instructions, educational materials, and care plan.   Telephone follow up appointment with care management team member scheduled for:  Reece Levy, MSW, LCSW Clinical Social Worker [facility] [phone]

## 2023-04-07 ENCOUNTER — Other Ambulatory Visit: Payer: Self-pay

## 2023-04-07 DIAGNOSIS — C3491 Malignant neoplasm of unspecified part of right bronchus or lung: Secondary | ICD-10-CM

## 2023-04-07 DIAGNOSIS — E890 Postprocedural hypothyroidism: Secondary | ICD-10-CM

## 2023-04-07 DIAGNOSIS — M797 Fibromyalgia: Secondary | ICD-10-CM

## 2023-04-07 MED ORDER — LEVOTHYROXINE SODIUM 125 MCG PO TABS: 125.0000 ug | ORAL_TABLET | Freq: Every day | ORAL | 0 refills | Status: DC

## 2023-04-08 MED ORDER — PREGABALIN 150 MG PO CAPS: 150.0000 mg | ORAL_CAPSULE | Freq: Two times a day (BID) | ORAL | 3 refills | Status: DC

## 2023-04-12 ENCOUNTER — Telehealth: Payer: Self-pay

## 2023-04-12 NOTE — Progress Notes (Signed)
Care Management & Coordination Services Pharmacy Team  Reason for Encounter: Appointment Reminder  Contacted patient to confirm telephone appointment with Artelia Laroche , PharmD on 04/14/23 at 2:00 pm.  Spoke with patient on 04/12/2023   Do you have any problems getting your medications? No  What is your top health concern you would like to discuss at your upcoming visit? No top concerns   Have you seen any other providers since your last visit with PCP? No   Chart review:  Recent office visits:  03/03/23 [name]. Seen for routine visit. No med changes.  Recent consult visits:  None  Hospital visits:  None   Star Rating Drugs:  Medication:  Last Fill: Day Supply Atorvastatin   04/06/23-12/31/22 90ds Losartan  02/28/23-12/09/22 90ds  Care Gaps: Annual wellness visit in last year? Yes  If Diabetic:None noted  Last eye exam / retinopathy screening: Last diabetic foot exam:   Roxana Hires, [facility] Clinical Pharmacist Assistant  [phone]

## 2023-04-14 ENCOUNTER — Ambulatory Visit: Payer: Self-pay | Admitting: *Deleted

## 2023-04-14 ENCOUNTER — Ambulatory Visit: Payer: Medicare HMO

## 2023-04-14 ENCOUNTER — Telehealth: Payer: Medicare HMO

## 2023-04-14 ENCOUNTER — Telehealth: Payer: Self-pay

## 2023-04-14 NOTE — Telephone Encounter (Signed)
Left message that forteo shipment was received.

## 2023-04-15 ENCOUNTER — Telehealth: Payer: Medicare HMO

## 2023-04-15 NOTE — Patient Outreach (Signed)
  Care Coordination   Follow Up Visit Note   04/15/2023 Name: Quinn Bartling MRN: [id number] DOB: 1951-09-11  Laylia Mui is a 72 y.o. year old female who sees [facility], Fritzi Mandes, MD for primary care. I spoke with  Marko Stai [place] by phone today.  What matters to the patients health and wellness today?  Want to focus more on my needs/my health and self care     Goals Addressed               This Visit's Progress     Patient Stated she feels overwhelmed sometimes and has depression issues. She has challenges with meeting daily care needs (pt-stated)        Activities and task to complete in order to accomplish goals.   Continue with compliance of taking medication prescribed by Doctor       Self Support options :  Continue with your activities including sewing, quilting, gardening, reading, daily walking,etc Consider seeking opportunities to connect with others through church, local senior center/silver sneakers, [employer], etc Consider potential part-time employment options when you are ready Start back with journaling (not daily) and/or the workbook mentioned for self growth/forgiveness Reach out to friends/neighbors for connecting and doing things together- save on gas money by carpooling  Consider [employer] transport  Get [employer] calendar for May!          SDOH assessments and interventions completed:  Yes     Care Coordination Interventions:  Yes, provided  Interventions Today    Flowsheet Row Most Recent Value  Chronic Disease   Chronic disease during today's visit Hypertension (HTN)  General Interventions   General Interventions Discussed/Reviewed Community Resources  Exercise Interventions   Exercise Discussed/Reviewed Exercise Discussed, Exercise Reviewed, Physical Activity  [walks daily- over 1 mile]  Physical Activity Discussed/Reviewed Physical Activity Discussed, Physical Activity Reviewed, Types of exercise, Gym  [considering pool and local center for  exercise]  Mental Health Interventions   Mental Health Discussed/Reviewed Mental Health Discussed, Mental Health Reviewed, Coping Strategies, Depression, Other  [Seeking ways to "forgive myself"]       Follow up plan: Follow up call scheduled for 05/04/23    Encounter Outcome:  Pt. Visit Completed

## 2023-04-15 NOTE — Patient Outreach (Signed)
Care Management & Coordination Services Pharmacy Note  04/15/2023 Name:  Victoria Pena MRN:  [id number] DOB:  April 07, 1951  Summary: -Pleasant 72 year old female presents for f/u CCM visit. 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 During the week she babysits her 72 year old autistic grandson and 66 year old granddaughter. She has a Hx of Fibromyalgia, 2 knee replacements, 2 ankle Sx, spinal fusion, Lung cancer, and Grave's Disease. She is a widow and lives by herself, takes pride in being very independent. She left home at 72 years of age and was married 3x. Her husband passed 9 years ago (2023). She is currently taking an 11 month course for medical coding and on month 2 (as of April 2023). -Was on food stamps but is over the cut-off by $200. She wants to retire but has to work until her car is paid off in 2026 -From [place] -In December 2023, she lost her job because she keeps falling. -December 2023, just got a new puppy named, Rivers. [place]/Australian shepherd  Recommendations/Changes made from today's visit: -Only able to find DEXA from 2019. Patient due for another   Subjective: Victoria Pena is an 72 y.o. year old female who is a primary patient of Cox, Kirsten, MD.  The care coordination team was consulted for assistance with disease management and care coordination needs.    Engaged with patient by telephone for follow up visit.  Recent office visits:  03/03/23 Victoria Del FNP. Seen for routine visit. No med changes.   Recent consult visits:  None   Hospital visits:  None   Objective:  Lab Results  Component Value Date   CREATININE 0.77 12/31/2022   BUN 15 12/31/2022   EGFR 92 10/12/2022   GFRNONAA >60 12/31/2022   GFRAA 106 12/23/2020   NA 139 12/31/2022   K 4.1 12/31/2022   CALCIUM 8.8 (L) 12/31/2022   CO2 22 12/31/2022   GLUCOSE 94 12/31/2022    No results found for: "HGBA1C",  "FRUCTOSAMINE", "GFR", "MICROALBUR"  Last diabetic Eye exam: No results found for: "HMDIABEYEEXA"  Last diabetic Foot exam: No results found for: "HMDIABFOOTEX"   Lab Results  Component Value Date   CHOL 154 10/12/2022   HDL 82 10/12/2022   LDLCALC 59 10/12/2022   TRIG 67 10/12/2022   CHOLHDL 1.9 10/12/2022       Latest Ref Rng & Units 10/12/2022    2:33 PM 08/10/2022   10:56 AM 01/25/2022    2:10 PM  Hepatic Function  Total Protein 6.0 - 8.5 g/dL 6.7  6.4  6.9   Albumin 3.8 - 4.8 g/dL 3.9  4.1  4.4   AST 0 - 40 IU/L 14  11  15    ALT 0 - 32 IU/L 14  11  15    Alk Phosphatase 44 - 121 IU/L 166  148  175   Total Bilirubin 0.0 - 1.2 mg/dL 0.4  0.5  0.4     Lab Results  Component Value Date/Time   TSH 1.570 03/03/2023 02:25 PM   TSH 2.920 08/10/2022 10:56 AM   FREET4 1.48 03/03/2023 02:25 PM   FREET4 1.90 (H) 02/19/2022 12:01 PM       Latest Ref Rng & Units 12/31/2022    1:22 PM 08/10/2022   10:56 AM 01/25/2022    2:10 PM  CBC  WBC 4.0 - 10.5 K/uL 5.4  4.8  5.0  Hemoglobin 12.0 - 15.0 g/dL 16.1  09.6  04.5   Hematocrit 36.0 - 46.0 % 38.5  42.9  41.8   Platelets 150 - 400 K/uL 233  263  283     Lab Results  Component Value Date/Time   VD25OH 23.4 (L) 03/03/2023 02:25 PM   VD25OH 22.3 (L) 01/25/2022 02:10 PM    Clinical ASCVD: Yes  The 10-year ASCVD risk score (Arnett DK, et al., 2019) is: 17%   Values used to calculate the score:     Age: 72 years     Sex: Female     Is Non-Hispanic African American: No     Diabetic: No     Tobacco smoker: Yes     Systolic Blood Pressure: 113 mmHg     Is BP treated: Yes     HDL Cholesterol: 82 mg/dL     Total Cholesterol: 154 mg/dL    Other: ([id number] if Afib, MMRC or CAT for COPD, ACT, DEXA)     03/01/2023    3:00 PM 12/30/2022    1:31 PM 12/29/2022    1:24 PM  Depression screen PHQ 2/9  Decreased Interest 1 2   Down, Depressed, Hopeless 0 0   PHQ - 2 Score 1 2   Altered sleeping 1 3   Tired, decreased energy 1 3    Change in appetite 0 0   Feeling bad or failure about yourself  0 1   Trouble concentrating 0 1   Moving slowly or fidgety/restless 0 0   Suicidal thoughts 0 0   PHQ-9 Score 3 10   Difficult doing work/chores Somewhat difficult Very difficult Somewhat difficult     Social History   Tobacco Use  Smoking Status Former   Packs/day: 1.50   Years: 35.00   Additional pack years: 0.00   Total pack years: 52.50   Types: Cigarettes   Quit date: 07/27/1999   Years since quitting: 23.7  Smokeless Tobacco Never   BP Readings from Last 3 Encounters:  03/03/23 106/68  01/22/23 125/76  01/13/23 114/72   Pulse Readings from Last 3 Encounters:  03/03/23 75  01/22/23 77  01/13/23 74   Wt Readings from Last 3 Encounters:  03/03/23 178 lb (80.7 kg)  01/21/23 178 lb (80.7 kg)  01/13/23 178 lb (80.7 kg)   BMI Readings from Last 3 Encounters:  03/03/23 29.62 kg/m  01/21/23 29.62 kg/m  01/13/23 29.62 kg/m    Allergies  Allergen Reactions   Keflex [Cephalexin] Shortness Of Breath    Hard time breathing   Robaxin [Methocarbamol] Nausea Only    Stomach pain . Could not eat   Hydrocodone-Acetaminophen Anxiety    Face flushed, high blood pressure, headache, shaking.   Hydroxyzine Other (See Comments) and Swelling    Numbness in lips   Prednisone & Diphenhydramine Other (See Comments)    Face flushed, heart racing   Amlodipine Other (See Comments)    Bp worsened. Shaky. Insomnia.   Ciprofloxacin    Dextromethorphan Polistirex Er    Nitrofurantoin    Pravastatin Other (See Comments)    joint pains   Diphenhydramine Hcl Palpitations   Moxifloxacin Rash   Prednisone Other (See Comments) and Palpitations    "flushing"     Medications Reviewed Today     Reviewed by Victoria Sax, RN (Case Manager) on 03/10/23 at 1610  Med List Status: <None>   Medication Order Taking? Sig Documenting Provider Last Dose Status Informant  albuterol (VENTOLIN HFA) 108 (  90 Base) MCG/ACT  inhaler [id number] No Inhale 2 puffs into the lungs every 6 (six) hours as needed for wheezing or shortness of breath. Victoria Del, FNP Taking Active Self  ALPRAZolam Prudy Feeler) 0.5 MG tablet [id number] No TAKE 1 TABLET(0.5 MG) BY MOUTH TWICE DAILY AS NEEDED FOR ANXIETY  Patient taking differently: Take 0.5 mg by mouth 2 (two) times daily as needed for anxiety. TAKE 1 TABLET(0.5 MG) BY MOUTH TWICE DAILY AS NEEDED FOR ANXIETY   Abigail Miyamoto, MD Taking Active Self  atorvastatin (LIPITOR) 40 MG tablet [id number] No TAKE 1 TABLET(40 MG) BY MOUTH DAILY  Patient taking differently: Take 40 mg by mouth daily.   Abigail Miyamoto, MD Taking Active Self  buPROPion (WELLBUTRIN XL) 150 MG 24 hr tablet [id number] No Take 1 tablet (150 mg total) by mouth daily. Victoria Del, FNP Taking Active   Calcium Carb-Cholecalciferol (CALCIUM 500 + D3) 500-5 MG-MCG TABS [id number] No Take 3 tablets by mouth daily.  Patient taking differently: Take 1 tablet by mouth in the morning and at bedtime.   Abigail Miyamoto, MD Taking Active Self  calcium carbonate (TUMS - DOSED IN MG ELEMENTAL CALCIUM) 500 MG chewable tablet [id number] No Chew 1 tablet by mouth 2 (two) times daily as needed for indigestion or heartburn. [provider] Taking Active Self  cyclobenzaprine (FLEXERIL) 10 MG tablet [id number] No Take 1 tablet (10 mg total) by mouth 3 (three) times daily as needed for muscle spasms. Barnett Abu, MD Taking Active   denosumab (PROLIA) 60 MG/ML SOSY injection [id number] No Inject 60 mg into the skin every 6 (six) months. [provider] Taking Active   EPINEPHrine 0.3 mg/0.3 mL IJ SOAJ injection [id number] No Inject 0.3 mg into the muscle as needed for anaphylaxis. Janie Morning, NP Taking Active Self  escitalopram (LEXAPRO) 10 MG tablet [id number] No Take 1 tablet (10 mg total) by mouth daily. Victoria Del, FNP Taking Active   fluticasone (FLONASE) 50 MCG/ACT nasal spray [id number] No Place  2 sprays into both nostrils daily.  Patient taking differently: Place 2 sprays into both nostrils at bedtime.   Janie Morning, NP Taking Active Self  levothyroxine (SYNTHROID) 125 MCG tablet [id number] No Take 1 tablet (125 mcg total) by mouth daily before breakfast. TAKE 1 TABLET(125 MCG) BY MOUTH DAILY Strength: 125 mcg Victoria Del, FNP Taking Active   losartan (COZAAR) 50 MG tablet [id number] No Take 1 tablet (50 mg total) by mouth daily. Victoria Del, FNP Taking Active   metroNIDAZOLE (METROGEL) 0.75 % gel [id number] No Apply 1 application topically 2 (two) times daily.  Patient taking differently: Apply 1 application  topically 2 (two) times daily as needed (rosacea).   Abigail Miyamoto, MD Taking Active Self  Multiple Vitamin (MULTIVITAMIN WITH MINERALS) TABS tablet [id number] No Take 1 tablet by mouth in the morning and at bedtime. [provider] Taking Active Self  mupirocin ointment (BACTROBAN) 2 % [id number] No Apply 1 Application topically 2 (two) times daily. Abigail Miyamoto, MD Taking Active Self  oxyCODONE 10 MG TABS [id number] No Take 1 tablet (10 mg total) by mouth every 3 (three) hours as needed for severe pain ((score 7 to 10)). Barnett Abu, MD Taking Active   pantoprazole (PROTONIX) 40 MG tablet [id number] No Take 1 tablet (40 mg total) by mouth daily. Abigail Miyamoto, MD Taking Active Self  pregabalin (LYRICA) 150 MG capsule [id number] No TAKE 1 CAPSULE(150 MG) BY MOUTH TWICE DAILY  Patient taking differently: Take  150 mg by mouth 2 (two) times daily.   Abigail Miyamoto, MD Taking Active Self  Spacer/Aero-Holding Rudean Curt [id number] No 1 each by Does not apply route daily. Victoria Del, FNP Taking Active Self  sulfamethoxazole-trimethoprim (BACTRIM DS) 800-160 MG tablet [id number] No Take 1 tablet by mouth 2 (two) times daily. Cox, Kirsten, MD Taking Active   trolamine salicylate (ASPERCREME) 10 % cream [id number] No Apply 1 Application  topically as needed for muscle pain. [provider] Taking Active Self            SDOH:  (Social Determinants of Health) assessments and interventions performed: Yes SDOH Interventions    Flowsheet Row Care Coordination from 04/14/2023 in [facility] Care Coordination from 03/01/2023 in [facility] Coordination Chronic Care Management from 01/13/2023 in [facility] Office Visit from 12/30/2022 in [facility] Care Coordination from 12/29/2022 in [facility] Care Coordination Office Visit from 12/16/2022 in [facility]  SDOH Interventions        Food Insecurity Interventions -- -- Other (Comment)  [working with LCSW and has resources from the care guides for food banks] -- -- --  Housing Interventions -- -- Intervention Not Indicated -- -- --  Transportation Interventions Intervention Not Indicated -- Intervention Not Indicated -- -- --  Utilities Interventions -- -- Other (Comment)  [resources from care guides, working with SW] -- -- --  Alcohol Usage Interventions -- -- Intervention Not Indicated (Score <7) -- -- --  Depression Interventions/Treatment  -- Medication -- Medication Counseling, Medication, Currently on Treatment, PHQ2-9 Score <4 Follow-up Not Indicated Counseling, Currently on Treatment  Financial Strain Interventions Other (Comment)  [PAP and SW referred] -- Other (Comment)  [community resources and Child psychotherapist assistance] -- Other (Comment)  [Community Resource Care Guide/BSW outreach to pt] --  Stress Interventions -- -- Other (Comment)  [patient with chronic pain and stress about surgery and finances, working with SW and care guides] -- Provide Counseling, Other (Comment)  [stress related to chronic pain, finances,etc] --  Social Connections Interventions -- -- Other (Comment)  [has good support from her children] -- -- --       Medication  Assistance: (See note)   Name and location of Current pharmacy:  [id number] Rosalita Levan, [place] - [address] AT [facility] [address] [place] [address] Phone: [phone] Fax: [phone]  [facility] - [place], [place] - [address] [place] [address] Phone: [phone] Fax: [phone]   Compliance/Adherence/Medication fill history: Star Rating Drugs:  Medication:                Last Fill:         Day Supply Atorvastatin                 04/06/23-12/31/22 90ds Losartan                      02/28/23-12/09/22 90ds   Care Gaps: Annual wellness visit in last year? 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 -Denies hypotensive/hypertensive symptoms -Educated on BP goals and benefits of medications for prevention of heart attack, stroke and kidney damage; -Counseled to monitor BP at home PRN, document, and provide log at future appointments -Recommended to continue current medication June 2023: Patient started walking 20-30 minutes every evening!   Depression/Anxiety (Goal: Improve mood) -Not ideally controlled -Current treatment: Bupropion 150mg  XL Appropriate, Effective, Safe, Accessible Escitalopram 10mg  Appropriate, Effective, Safe, Accessible Alprazolam 0.5mg  PRN  Appropriate, Effective, Safe, Accessible -Medications previously tried/failed: Escitalopram -PHQ9:     03/01/2023    3:00 PM 12/30/2022    1:31 PM 12/29/2022    1:24 PM  Depression screen PHQ 2/9  Decreased Interest 1 2   Down, Depressed, Hopeless 0 0   PHQ - 2 Score 1 2   Altered sleeping 1 3   Tired, decreased energy 1 3   Change in appetite 0 0   Feeling bad or failure about yourself  0 1   Trouble concentrating 0 1   Moving slowly or fidgety/restless 0 0   Suicidal thoughts 0 0   PHQ-9 Score 3 10   Difficult doing work/chores Somewhat difficult Very difficult Somewhat difficult   -GAD7:     12/30/2022    1:30 PM 12/03/2022   11:42 AM 03/24/2020    2:03 PM  GAD 7 : Generalized Anxiety Score  Nervous, Anxious, on Edge 1 2 2   Control/stop worrying 2 2 1   Worry too much - different things 2 3 1   Trouble relaxing 2 2 2   Restless 0 3 2  Easily annoyed or irritable 0 2 2  Afraid - awful might happen 0 2 2  Total GAD 7 Score 7 16 12   Anxiety Difficulty Somewhat difficult  Very difficult   -Educated on Benefits of medication for symptom control April 2023: Wanted to do PHQ9/GAD7 today but patient was babysitting 3 grandkids and kept being interrupted. Will try at future visit to do scores. However, patient stated multiple times, "I'm just tired all the time" and states she is not content with therapy. Was on Bupropion 300mg  in the past but was decreased due to addition of Hydroxyzine. She would like to try Bupropion 300mg  again, will ask PCP June 2023: Content on increase dose. Had shakiness at first, PCP recommended she bump down med, but patient continued taking 300mg  and ADR's dissapated  December 2023: Patient is still very depressed. She lost her job (Because she kept falling) and now can't afford medications/housing. Patient wants to increase Aripiprazole dose from 5mg ->10mg . Sent Dr. Marina Goodell direct msg, he increased dose -Patient agreed to Social Worker consult, will ask Dr. Sedalia Muta  about this too April 2024: Patient is very thankful for Child psychotherapist. She has started making a list of things she wants to do that bring her joy. She states she is still depressed but feels significantly better than before. Reading historical stuff (Series called, "Leah's Garden") and "Ember's in the [employer] and is Sewing more   Thyroid (Grave's Disease) (Goal TSH: 0.4-5.0) Lab Results  Component Value Date   TSH 1.570 03/03/2023  -Uncontrolled -Current treatment: Levothyroxine Appropriate, Effective, Safe, Accessible -Counseled to take medication on an empty stomach -Recommended to continue  current medication     Chronic Pain -Not ideally controlled -Current treatment: Pregabalin 150mg  Appropriate, Effective, Safe, Accessible Oxycodone 10mg  Appropriate, Effective, Safe, Accessible -Medications previously tried: IBU (No longer taking per patient) -Pain Scale There were no vitals filed for this visit.  Aggravating Factors: Movement  Pain Type: Chronic  April 2023: Unable to get pain scale today. Patient babysitting multiple grandkids and we kept getting interrupted. However, she did state she was not satisfied with pain management. Main priority was mood, will look into pain at subsequent visits June 2023: Unable to conduct pain scale today, patient is unsatisfied with therapy but is able to do her daily activities of life with medications April 2024: Patient states she is still in pain but content with therapy     Osteoporosis / Osteopenia (Goal prevent fracture) -Uncontrolled -Last DEXA Scan: 2019  T-Score Femur Total Right:  04/25/18: -3.0 T-Score lumbar spine:  04/25/18: -3.0             10-year probability of major osteoporotic fracture: 19%             10-year probability of hip fracture: 6.4% -Patient is a candidate for pharmacologic treatment due to T-Score < -2.5 in femoral neck -Current treatment  Calcium/Vit D Appropriate, Query effective, Safe,  Accessible Prolia Appropriate, Query effective, Safe, Accessible December 2023, cost $99 and patient paid it Teriparatide Appropriate, Query effective,  2024: PAP Approved -Medications previously tried:   -Recommend [phone] units of vitamin D daily. Recommend 1200 mg of calcium daily from dietary and supplemental sources. December 2023: Will have CCM team look into Teriparatide PAP April 2024: Teriparatide PAP Approved. Patient hasn't started. Will send msg to PCP. Due for DEXA  CP F/U October 2024  Artelia Laroche, Ilda Basset.D. - [phone]

## 2023-04-15 NOTE — Patient Instructions (Signed)
Visit Information  Thank you for taking time to visit with me today. Please don't hesitate to contact me if I can be of assistance to you.   Following are the goals we discussed today:   Goals Addressed               This Visit's Progress     Patient Stated she feels overwhelmed sometimes and has depression issues. She has challenges with meeting daily care needs (pt-stated)        Activities and task to complete in order to accomplish goals.   Continue with compliance of taking medication prescribed by Doctor       Self Support options :  Continue with your activities including sewing, quilting, gardening, reading, daily walking,etc Consider seeking opportunities to connect with others through church, local senior center/silver sneakers, [employer], etc Consider potential part-time employment options when you are ready Start back with journaling (not daily) and/or the workbook mentioned for self growth/forgiveness Reach out to friends/neighbors for connecting and doing things together- save on gas money by carpooling  Consider [employer] transport  Get [employer] calendar for May!          Our next appointment is by telephone on 05/04/23  Please call the care guide team at [phone] if you need to cancel or reschedule your appointment.   If you are experiencing a Mental Health or Behavioral Health Crisis or need someone to talk to, please call the Suicide and Crisis Lifeline: 988 call 911   The patient verbalized understanding of instructions, educational materials, and care plan provided today and DECLINED offer to receive copy of patient instructions, educational materials, and care plan.   Telephone follow up appointment with care management team member scheduled for: 05/04/23  Reece Levy, MSW, LCSW Clinical Social Worker [facility] [phone]

## 2023-04-18 ENCOUNTER — Telehealth: Payer: Medicare HMO

## 2023-04-18 ENCOUNTER — Ambulatory Visit (INDEPENDENT_AMBULATORY_CARE_PROVIDER_SITE_OTHER): Payer: Medicare HMO

## 2023-04-18 ENCOUNTER — Other Ambulatory Visit: Payer: Self-pay

## 2023-04-18 ENCOUNTER — Telehealth: Payer: Self-pay

## 2023-04-18 DIAGNOSIS — F418 Other specified anxiety disorders: Secondary | ICD-10-CM

## 2023-04-18 DIAGNOSIS — F419 Anxiety disorder, unspecified: Secondary | ICD-10-CM

## 2023-04-18 DIAGNOSIS — F064 Anxiety disorder due to known physiological condition: Secondary | ICD-10-CM

## 2023-04-18 DIAGNOSIS — I10 Essential (primary) hypertension: Secondary | ICD-10-CM

## 2023-04-18 DIAGNOSIS — F324 Major depressive disorder, single episode, in partial remission: Secondary | ICD-10-CM

## 2023-04-18 DIAGNOSIS — J432 Centrilobular emphysema: Secondary | ICD-10-CM

## 2023-04-18 MED ORDER — ALPRAZOLAM 0.5 MG PO TABS: 0.5000 mg | ORAL_TABLET | Freq: Two times a day (BID) | ORAL | 0 refills | Status: DC | PRN

## 2023-04-18 NOTE — Patient Instructions (Signed)
Please call the care guide team at [phone] if you need to cancel or reschedule your appointment.   If you are experiencing a Mental Health or Behavioral Health Crisis or need someone to talk to, please call the Suicide and Crisis Lifeline: 988 call the [place] National Suicide Prevention Lifeline: 564-597-0271 or TTY: (947)257-5176 TTY (636)317-0506) to talk to a trained counselor call 1-800-273-TALK (toll free, 24 hour hotline) go to [facility] [address], [place] [phone])   Following is a copy of the CCM Program Consent:  CCM service includes personalized support from designated clinical staff supervised by the physician, including individualized plan of care and coordination with other care providers 24/7 contact phone numbers for assistance for urgent and routine care needs. Service will only be billed when office clinical staff spend 20 minutes or more in a month to coordinate care. Only one practitioner may furnish and bill the service in a calendar month. The patient may stop CCM services at amy time (effective at the end of the month) by phone call to the office staff. The patient will be responsible for cost sharing (co-pay) or up to 20% of the service fee (after annual deductible is met)  Following is a copy of your full provider care plan:   Goals Addressed             This Visit's Progress    CCM Expected Outcome:  Monitor, Self-Manage and Reduce Symptoms of: Depression, Anxiety, Stress       Current Barriers:  Care Coordination needs related to resources in the community for help with mental health needs and community resources for financial support in a patient with anxiety, depression, and stress Chronic Disease Management support and education needs related to effective management of stress, anxiety, and depression  Planned Interventions: Evaluation of current treatment plan related to stress, anxiety, and depression  and patient's adherence to plan as established by provider. Today was not a good day for the patient. She states that she has had a lot of pain today and her depression is worse. She is very frustrated because she cannot do things like she did and also does not financially have the means to get things she needs. She has one son that can afford to give her money but she hates to ask him for it. She would like to do something part time but her health will not allow that right now. Is working with the LCSW and states that has been helpful. She has made a bucket list and [facility] They will have a new calendar out in the next couple of days. She says that she is interested in the socialization and events there. She was excited to get to sewing again and the light went out in her sewing machine. She has to get a new light. She has had some blessing recently with her neighbors who she did some sewing for give her some unexpected money for sewing and unexpected gift card from another couple for food.  She was especially thankful for this. She wants to get back into church. She misses the fellowship and the bible studies there. Reflective listening and support given.  Advised patient to call the office for changes in stress, anxiety, and depression Provided education to patient re: working with the CCM team, support available with resources, ongoing outreaches for effective management of stress, anxiety, and depression Reviewed medications with patient and discussed compliance. The  patient is compliant with her medications. Denies any medication needs at this time. Works with the pharm D on effective medication management. Reviewed scheduled/upcoming provider appointments including 06-06-2023 at 2 pm Care Guide referral for resources in the community to help with food resources and financial constraints. Has been provided with information. The patient received information from the  care guides but she states that none of those programs can actually help her. Will continue to monitor for changes and new programs that may be a resource for the patient.  Social Work referral for ongoing support and education related to effective management of depression, anxiety, stress. The patient has talked with the LCSW. The patient is feeling some better. She knows she needs to take things slow. She knows she cannot return to the work she previously did. The patient knows the care team is available to assist with needs.  Pharmacy referral for ongoing support and education of medication needs and management. Ongoing support and education from the pharm D. Discussed plans with patient for ongoing care management follow up and provided patient with direct contact information for care management team Advised patient to discuss changes in mood, anxiety, depression, or mental health needs with provider Screening for signs and symptoms of depression related to chronic disease state  Assessed social determinant of health barriers Discussed self care and doing hobbies that make the patient happy or going on events that do not cost money, or doing things with family and friends. Talked to the patient about [email or web address] as this is an online bible study group that may help also with Bible study resources.   The patient knows to call the [facility] in between outreaches for new needs or concerns.   Symptom Management: Take medications as prescribed   Attend all scheduled provider appointments Call provider office for new concerns or questions  call the Suicide and Crisis Lifeline: 988 call the [place] National Suicide Prevention Lifeline: (250)061-2805 or TTY: (780)430-3581 TTY 725-671-2841) to talk to a trained counselor call 1-800-273-TALK (toll free, 24 hour hotline) if experiencing a Mental Health or Behavioral Health Crisis   Follow Up Plan: Telephone follow up appointment with care management team member  scheduled for: 05-24-2023 at 345 pm        CCM Expected Outcome:  Monitor, Self-Manage, and Reduce Symptoms of Hypertension       Current Barriers:  Chronic Disease Management support and education needs related to effective management of HTN BP Readings from Last 3 Encounters:  03/03/23 106/68  01/22/23 125/76  01/13/23 114/72    Planned Interventions: Evaluation of current treatment plan related to hypertension self management and patient's adherence to plan as established by provider. Her blood pressures are stable and she is doing well post surgery for lumbar radiculopathy. Denies any acute changes in her blood pressures. No new issues with CP. Will see the surgeon soon and hopefully she will be able to get a specialist to evaluate her hip pain. She thinks she may need a hip replacement. Education and support given. ;   Provided education to patient re: stroke prevention, s/s of heart attack and stroke; Reviewed prescribed diet heart healthy diet. The patient is compliant with a heart healthy diet.  Reviewed medications with patient and discussed importance of compliance. The patient is compliant with medications. Works with the pharm D on an ongoing basis;  Discussed plans with patient for ongoing care management follow up and provided patient with direct contact information for care management team; Advised patient, providing  education and rationale, to monitor blood pressure daily and record, calling PCP for findings outside established parameters;  Reviewed scheduled/upcoming provider appointments including: 06-06-2023 at 2 pm Advised patient to discuss changes in HTN or heart health with provider; Provided education on prescribed diet heart healthy ;  Discussed complications of poorly controlled blood pressure such as heart disease, stroke, circulatory complications, vision complications, kidney impairment, sexual dysfunction;  Screening for signs and symptoms of depression related to  chronic disease state;  Assessed social determinant of health barriers;   Symptom Management: Take medications as prescribed   Attend all scheduled provider appointments Call provider office for new concerns or questions  call the Suicide and Crisis Lifeline: 988 call the [place] National Suicide Prevention Lifeline: 508-561-7569 or TTY: (901)865-5858 TTY 224-503-5983) to talk to a trained counselor call 1-800-273-TALK (toll free, 24 hour hotline) if experiencing a Mental Health or Behavioral Health Crisis  check blood pressure weekly learn about high blood pressure call doctor for signs and symptoms of high blood pressure develop an action plan for high blood pressure keep all doctor appointments take medications for blood pressure exactly as prescribed report new symptoms to your doctor  Follow Up Plan: Telephone follow up appointment with care management team member scheduled for: 05-24-2023 at 345 pm       CCM:  Maintain, Monitor and Self-Manage Symptoms of COPD       Current Barriers:  Chronic Disease Management support and education needs related to effective COPD  Planned Interventions: Provided patient with basic written and verbal COPD education on self care/management/and exacerbation prevention. The patient is doing well post surgery on 01-21-2023 for a lumbar radiculopathy. She denies any new issues with her breathing at this time. Denies any issues with her breathing at this time. Knows how to effectively management. COPD is stable. Denies any new acute changes in her breathing.  Advised patient to track and manage COPD triggers. Is aware of things that exacerbate her condition. Provided written and verbal instructions on pursed lip breathing and utilized returned demonstration as teach back Provided instruction about proper use of medications used for management of COPD including inhalers Advised patient to self assesses COPD action plan zone and make appointment with provider  if in the yellow zone for 48 hours without improvement Advised patient to engage in light exercise as tolerated 3-5 days a week to aid in the the management of COPD. The patient is doing some things outside and wants to be more active. She deals with chronic pain and will be released soon from the surgeon. She says likely she is going to have to have hip replacement surgery due to hip pain and discomfort.  Provided education about and advised patient to utilize infection prevention strategies to reduce risk of respiratory infection. The patient is mindful of her surroundings and is safe. The patient knows what worse looks like and to call for changes or needs.  Discussed the importance of adequate rest and management of fatigue with COPD Screening for signs and symptoms of depression related to chronic disease state  Assessed social determinant of health barriers  Symptom Management: Take medications as prescribed   Attend all scheduled provider appointments Call provider office for new concerns or questions  call the Suicide and Crisis Lifeline: 988 call the [place] National Suicide Prevention Lifeline: 6057980521 or TTY: 410 339 9865 TTY (475)581-7281) to talk to a trained counselor call 1-800-273-TALK (toll free, 24 hour hotline) if experiencing a Mental Health or Behavioral Health Crisis  identify  and remove indoor air pollutants limit outdoor activity during cold weather listen for public air quality announcements every day develop a rescue plan eliminate symptom triggers at home follow rescue plan if symptoms flare-up  Follow Up Plan: Telephone follow up appointment with care management team member scheduled for: 05-24-2023 and 345 pm          Patient verbalizes understanding of instructions and care plan provided today and agrees to view in MyChart. Active MyChart status and patient understanding of how to access instructions and care plan via MyChart confirmed with  patient.  Telephone follow up appointment with care management team member scheduled for: 05-24-2023 at 345 pm

## 2023-04-18 NOTE — Telephone Encounter (Signed)
   CCM RN Visit Note   04-18-2023 Name: Victoria Pena MRN: [id number]      DOB: 16-May-1951  Subjective: Victoria Pena is a 72 y.o. year old female who is a primary care patient of Dr. Blane Ohara. The patient was referred to the Chronic Care Management team for assistance with care management needs subsequent to provider initiation of CCM services and plan of care.      An unsuccessful telephone outreach was attempted today to contact the patient about Chronic Care Management needs.    Plan:A HIPAA compliant phone message was left for the patient providing contact information and requesting a return call.  Alto Denver RN, MSN, CCM RN Care Manager  Chronic Care Management Direct Number: [phone]

## 2023-04-18 NOTE — Chronic Care Management (AMB) (Signed)
Chronic Care Management   CCM RN Visit Note  04/18/2023 Name: Victoria Pena MRN: [id number] DOB: 03/12/1951  Subjective: Victoria Pena is a 72 y.o. year old female who is a primary care patient of Cox, Kirsten, MD. The patient was referred to the Chronic Care Management team for assistance with care management needs subsequent to provider initiation of CCM services and plan of care.    Today's Visit:  Engaged with patient by telephone for follow up visit.        Goals Addressed             This Visit's Progress    CCM Expected Outcome:  Monitor, Self-Manage and Reduce Symptoms of: Depression, Anxiety, Stress       Current Barriers:  Care Coordination needs related to resources in the community for help with mental health needs and community resources for financial support in a patient with anxiety, depression, and stress Chronic Disease Management support and education needs related to effective management of stress, anxiety, and depression  Planned Interventions: Evaluation of current treatment plan related to stress, anxiety, and depression and patient's adherence to plan as established by provider. Today was not a good day for the patient. She states that she has had a lot of pain today and her depression is worse. She is very frustrated because she cannot do things like she did and also does not financially have the means to get things she needs. She has one son that can afford to give her money but she hates to ask him for it. She would like to do something part time but her health will not allow that right now. Is working with the LCSW and states that has been helpful. She has made a bucket list and [facility] They will have a new calendar out in the next couple of days. She says that she is interested in the socialization and events there. She was excited to get to sewing again and the light went out in her sewing machine. She has to get a  new light. She has had some blessing recently with her neighbors who she did some sewing for give her some unexpected money for sewing and unexpected gift card from another couple for food.  She was especially thankful for this. She wants to get back into church. She misses the fellowship and the bible studies there. Reflective listening and support given.  Advised patient to call the office for changes in stress, anxiety, and depression Provided education to patient re: working with the CCM team, support available with resources, ongoing outreaches for effective management of stress, anxiety, and depression Reviewed medications with patient and discussed compliance. The patient is compliant with her medications. Denies any medication needs at this time. Works with the pharm D on effective medication management. Reviewed scheduled/upcoming provider appointments including 06-06-2023 at 2 pm Care Guide referral for resources in the community to help with food resources and financial constraints. Has been provided with information. The patient received information from the care guides but she states that none of those programs can actually help her. Will continue to monitor for changes and new programs that may be a resource for the patient.  Social Work referral for ongoing support and education related to effective management of depression, anxiety, stress. The patient has talked with the LCSW. The patient is feeling some better. She knows she needs to take things slow. She knows she cannot return  to the work she previously did. The patient knows the care team is available to assist with needs.  Pharmacy referral for ongoing support and education of medication needs and management. Ongoing support and education from the pharm D. Discussed plans with patient for ongoing care management follow up and provided patient with direct contact information for care management team Advised patient to discuss changes in  mood, anxiety, depression, or mental health needs with provider Screening for signs and symptoms of depression related to chronic disease state  Assessed social determinant of health barriers Discussed self care and doing hobbies that make the patient happy or going on events that do not cost money, or doing things with family and friends. Talked to the patient about [employer] as this is an online bible study group that may help also with Bible study resources.   The patient knows to call the [facility] in between outreaches for new needs or concerns.   Symptom Management: Take medications as prescribed   Attend all scheduled provider appointments Call provider office for new concerns or questions  call the Suicide and Crisis Lifeline: 988 call the [place] National Suicide Prevention Lifeline: (703) 089-5568 or TTY: (531) 296-7546 TTY 325 410 4192) to talk to a trained counselor call 1-800-273-TALK (toll free, 24 hour hotline) if experiencing a Mental Health or Behavioral Health Crisis   Follow Up Plan: Telephone follow up appointment with care management team member scheduled for: 05-24-2023 at 345 pm        CCM Expected Outcome:  Monitor, Self-Manage, and Reduce Symptoms of Hypertension       Current Barriers:  Chronic Disease Management support and education needs related to effective management of HTN BP Readings from Last 3 Encounters:  03/03/23 106/68  01/22/23 125/76  01/13/23 114/72    Planned Interventions: Evaluation of current treatment plan related to hypertension self management and patient's adherence to plan as established by provider. Her blood pressures are stable and she is doing well post surgery for lumbar radiculopathy. Denies any acute changes in her blood pressures. No new issues with CP. Will see the surgeon soon and hopefully she will be able to get a specialist to evaluate her hip pain. She thinks she may need a hip replacement. Education and support given. ;   Provided  education to patient re: stroke prevention, s/s of heart attack and stroke; Reviewed prescribed diet heart healthy diet. The patient is compliant with a heart healthy diet.  Reviewed medications with patient and discussed importance of compliance. The patient is compliant with medications. Works with the pharm D on an ongoing basis;  Discussed plans with patient for ongoing care management follow up and provided patient with direct contact information for care management team; Advised patient, providing education and rationale, to monitor blood pressure daily and record, calling PCP for findings outside established parameters;  Reviewed scheduled/upcoming provider appointments including: 06-06-2023 at 2 pm Advised patient to discuss changes in HTN or heart health with provider; Provided education on prescribed diet heart healthy ;  Discussed complications of poorly controlled blood pressure such as heart disease, stroke, circulatory complications, vision complications, kidney impairment, sexual dysfunction;  Screening for signs and symptoms of depression related to chronic disease state;  Assessed social determinant of health barriers;   Symptom Management: Take medications as prescribed   Attend all scheduled provider appointments Call provider office for new concerns or questions  call the Suicide and Crisis Lifeline: 988 call the [place] National Suicide Prevention Lifeline: (425)034-8696 or TTY: 8133872348 TTY (646)637-4585)  to talk to a trained counselor call 1-800-273-TALK (toll free, 24 hour hotline) if experiencing a Mental Health or Behavioral Health Crisis  check blood pressure weekly learn about high blood pressure call doctor for signs and symptoms of high blood pressure develop an action plan for high blood pressure keep all doctor appointments take medications for blood pressure exactly as prescribed report new symptoms to your doctor  Follow Up Plan: Telephone follow up  appointment with care management team member scheduled for: 05-24-2023 at 345 pm       CCM:  Maintain, Monitor and Self-Manage Symptoms of COPD       Current Barriers:  Chronic Disease Management support and education needs related to effective COPD  Planned Interventions: Provided patient with basic written and verbal COPD education on self care/management/and exacerbation prevention. The patient is doing well post surgery on 01-21-2023 for a lumbar radiculopathy. She denies any new issues with her breathing at this time. Denies any issues with her breathing at this time. Knows how to effectively management. COPD is stable. Denies any new acute changes in her breathing.  Advised patient to track and manage COPD triggers. Is aware of things that exacerbate her condition. Provided written and verbal instructions on pursed lip breathing and utilized returned demonstration as teach back Provided instruction about proper use of medications used for management of COPD including inhalers Advised patient to self assesses COPD action plan zone and make appointment with provider if in the yellow zone for 48 hours without improvement Advised patient to engage in light exercise as tolerated 3-5 days a week to aid in the the management of COPD. The patient is doing some things outside and wants to be more active. She deals with chronic pain and will be released soon from the surgeon. She says likely she is going to have to have hip replacement surgery due to hip pain and discomfort.  Provided education about and advised patient to utilize infection prevention strategies to reduce risk of respiratory infection. The patient is mindful of her surroundings and is safe. The patient knows what worse looks like and to call for changes or needs.  Discussed the importance of adequate rest and management of fatigue with COPD Screening for signs and symptoms of depression related to chronic disease state  Assessed social  determinant of health barriers  Symptom Management: Take medications as prescribed   Attend all scheduled provider appointments Call provider office for new concerns or questions  call the Suicide and Crisis Lifeline: 988 call the [place] National Suicide Prevention Lifeline: 570-006-2192 or TTY: (845)739-9725 TTY (320)485-3065) to talk to a trained counselor call 1-800-273-TALK (toll free, 24 hour hotline) if experiencing a Mental Health or Behavioral Health Crisis  identify and remove indoor air pollutants limit outdoor activity during cold weather listen for public air quality announcements every day develop a rescue plan eliminate symptom triggers at home follow rescue plan if symptoms flare-up  Follow Up Plan: Telephone follow up appointment with care management team member scheduled for: 05-24-2023 and 345 pm          Plan:Telephone follow up appointment with care management team member scheduled for:  05-24-2023 at 345 pm  Alto Denver RN, MSN, CCM RN Care Manager  Chronic Care Management Direct Number: [phone]

## 2023-04-19 DIAGNOSIS — F32A Depression, unspecified: Secondary | ICD-10-CM

## 2023-04-19 DIAGNOSIS — I10 Essential (primary) hypertension: Secondary | ICD-10-CM

## 2023-04-19 DIAGNOSIS — J449 Chronic obstructive pulmonary disease, unspecified: Secondary | ICD-10-CM

## 2023-04-28 ENCOUNTER — Other Ambulatory Visit: Payer: Self-pay

## 2023-04-28 DIAGNOSIS — G894 Chronic pain syndrome: Secondary | ICD-10-CM

## 2023-04-28 MED ORDER — OXYCODONE HCL 10 MG PO TABS: 10.0000 mg | ORAL_TABLET | ORAL | 0 refills | Status: DC | PRN

## 2023-04-28 NOTE — Telephone Encounter (Signed)
Victoria Pena called requesting a refill on her pain medication.  Patient was informed that Dr. Sedalia Muta will refill the medication for 1 month but referral will be sent to pain management for further treatment.

## 2023-05-02 ENCOUNTER — Other Ambulatory Visit: Payer: Self-pay

## 2023-05-02 ENCOUNTER — Ambulatory Visit: Payer: Medicare HMO | Admitting: Cardiology

## 2023-05-02 DIAGNOSIS — Z13820 Encounter for screening for osteoporosis: Secondary | ICD-10-CM

## 2023-05-04 ENCOUNTER — Ambulatory Visit: Payer: Self-pay | Admitting: *Deleted

## 2023-05-04 ENCOUNTER — Telehealth: Payer: Self-pay

## 2023-05-04 NOTE — Telephone Encounter (Signed)
Referral sent to [facility] 05/02/23

## 2023-05-04 NOTE — Patient Instructions (Signed)
Visit Information  Thank you for taking time to visit with me today. Please don't hesitate to contact me if I can be of assistance to you.   Following are the goals we discussed today:   Goals Addressed               This Visit's Progress     Patient Stated she feels overwhelmed sometimes and has depression issues. She has challenges with meeting daily care needs (pt-stated)        Activities and task to complete in order to accomplish goals.   Glad to hear all the progress and positive news today Review your new [employer] Dual plan benefits- great that it will provide you healthy foods/card as well as dental, vision and more! Plans pending for Pain Clinic visit Keep journaling, sewing, reading, walking (incorporate daughter joining) and other good self care activities Continue with compliance of taking medication prescribed by Doctor- alert them if stomach issues continue Continue wearing your stimulator for back- glad you are feeling some relief and plans for follow up 06/07/23 with Neurosurgeon  Continue with your activities including sewing, quilting, gardening, reading, daily walking,etc Consider seeking opportunities to connect with others through church, [employer], [employer], etc Consider potential part-time employment options when you are ready Continue with journaling and/or the workbook mentioned for self growth/forgiveness Reach out to friends/neighbors for connecting and doing things together- save on gas money by carpooling  Consider [employer] transport  Review the [employer] calendar and consider the classes- sewing?          Our next appointment is by telephone on 06/08/23  Please call the care guide team at [phone] if you need to cancel or reschedule your appointment.   If you are experiencing a Mental Health or Behavioral Health Crisis or need someone to talk to, please call the Suicide and Crisis Lifeline: 988 call 911   The patient  verbalized understanding of instructions, educational materials, and care plan provided today and DECLINED offer to receive copy of patient instructions, educational materials, and care plan.   Telephone follow up appointment with care management team member scheduled for:06/08/23  Reece Levy, MSW, LCSW Clinical Social Worker [facility] [phone]

## 2023-05-04 NOTE — Patient Outreach (Signed)
  Care Coordination   Follow Up Visit Note   05/04/2023 Name: Victoria Pena MRN: [id number] DOB: Nov 24, 1951  Victoria Pena is a 72 y.o. year old female who sees Cox, Fritzi Mandes, MD for primary care. I spoke with  Marko Stai Mccleese by phone today.  What matters to the patients health and wellness today?  "Feel like the stimulator is helping pain better".    Goals Addressed               This Visit's Progress     Patient Stated she feels overwhelmed sometimes and has depression issues. She has challenges with meeting daily care needs (pt-stated)        Activities and task to complete in order to accomplish goals.   Glad to hear all the progress and positive news today Review your new [employer] Dual plan benefits- great that it will provide you healthy foods/card as well as dental, vision and more! Plans pending for Pain Clinic visit Keep journaling, sewing, reading, walking (incorporate daughter joining) and other good self care activities Continue with compliance of taking medication prescribed by Doctor- alert them if stomach issues continue Continue wearing your stimulator for back- glad you are feeling some relief and plans for follow up 06/07/23 with Neurosurgeon  Continue with your activities including sewing, quilting, gardening, reading, daily walking,etc Consider seeking opportunities to connect with others through church, [employer], [employer], etc Consider potential part-time employment options when you are ready Continue with journaling and/or the workbook mentioned for self growth/forgiveness Reach out to friends/neighbors for connecting and doing things together- save on gas money by carpooling  Consider [employer] transport  Review the [employer] calendar and consider the classes- sewing?          SDOH assessments and interventions completed:  Yes     Care Coordination Interventions:  Yes, provided  Interventions Today    Flowsheet Row Most  Recent Value  Chronic Disease   Chronic disease during today's visit Other  [chronic pain]  General Interventions   General Interventions Discussed/Reviewed General Interventions Discussed  [Pt is changing her insurance to a [employer] Dual plan which will offer $22monthly to put towards healthy foods which should allow for some extra cash to consider using for gas money to be able to get out and be more active (senior activities, swimming, etc]  Exercise Interventions   Physical Activity Discussed/Reviewed Physical Activity Discussed, Types of exercise  [continues to walk and plans to try to get her daughter to join her as well as with silver sneakers classes.]       Follow up plan: Follow up call scheduled for 06/08/23    Encounter Outcome:  Pt. Visit Completed

## 2023-05-04 NOTE — Telephone Encounter (Signed)
Called patient to let her know that her forteo was in the office to be picked up.

## 2023-05-04 NOTE — Telephone Encounter (Signed)
Patient called stating that the oxycodone is hurting her stomach she states its like a dull ache that comes along after taking the medication. She states she thought she was getting what the ortho had given her which was hydrocodone/acetaminophen. She states that the hydrocodone/acetaminophen does not hurt her stomach, and she is wanting to know if you could call that one in to the pharmacy instead. She states she still has not heard anything about her pain clinic referral neither too as well. Please advise.

## 2023-05-09 ENCOUNTER — Other Ambulatory Visit: Payer: Self-pay

## 2023-05-09 MED ORDER — OXYCODONE HCL 10 MG PO TABS: 10.0000 mg | ORAL_TABLET | Freq: Three times a day (TID) | ORAL | 0 refills | Status: DC | PRN

## 2023-05-10 ENCOUNTER — Telehealth: Payer: Self-pay

## 2023-05-10 NOTE — Progress Notes (Signed)
Discussion resolved on whether patient should be on Forteo or Prolia, Dr Sedalia Muta and patient oncology have determined that patient should be on Prolia. Patient due for next Prolia injection 05/26/2023, benefits verification received: Physical Purchase available, Prior Authorization required, patient does not have a deductible. For the primary MD Purchase option, Prolia and administration will be subject to a 20% coinsurance up to a $3600 out of pocket max ($7829.56 met). No deductible applies. Referral is not required. We have provided in network benefits only.  Prior authorization and Step Therapy are required & not on file. To initiate verbally or check status please contact [phone]. Requests can be submitted online thru [employer] or at: [email or web address]  Filling out medical pre certification request form and will fax to [facility] once PCP signature received.   05/12/2023- Faxed medical precertification form to [facility]    Billee Cashing, [facility] Clinical Pharmacist Assistant [phone]

## 2023-05-13 ENCOUNTER — Other Ambulatory Visit: Payer: Self-pay

## 2023-05-13 DIAGNOSIS — M810 Age-related osteoporosis without current pathological fracture: Secondary | ICD-10-CM

## 2023-05-13 DIAGNOSIS — H6692 Otitis media, unspecified, left ear: Secondary | ICD-10-CM | POA: Diagnosis not present

## 2023-05-13 DIAGNOSIS — H6691 Otitis media, unspecified, right ear: Secondary | ICD-10-CM | POA: Diagnosis not present

## 2023-05-13 DIAGNOSIS — H6123 Impacted cerumen, bilateral: Secondary | ICD-10-CM | POA: Diagnosis not present

## 2023-05-13 DIAGNOSIS — H9203 Otalgia, bilateral: Secondary | ICD-10-CM | POA: Diagnosis not present

## 2023-05-13 MED ORDER — DENOSUMAB 60 MG/ML ~~LOC~~ SOSY: 60.0000 mg | PREFILLED_SYRINGE | SUBCUTANEOUS | 1 refills | Status: DC

## 2023-05-13 NOTE — Telephone Encounter (Signed)
Received faxed Prolia approval #[id number] for 05/26/23 - 12/20/23.  Prolia RX sent to [facility].

## 2023-05-18 NOTE — Telephone Encounter (Signed)
Patient called and made aware of this appointment. Also was told the office would call her once her prolia was in to make an nurse appointment.

## 2023-05-21 ENCOUNTER — Other Ambulatory Visit: Payer: Self-pay | Admitting: Family Medicine

## 2023-05-21 DIAGNOSIS — F064 Anxiety disorder due to known physiological condition: Secondary | ICD-10-CM

## 2023-05-22 ENCOUNTER — Other Ambulatory Visit: Payer: Self-pay | Admitting: Family Medicine

## 2023-05-24 ENCOUNTER — Telehealth: Payer: Medicare HMO

## 2023-05-24 ENCOUNTER — Telehealth: Payer: Self-pay

## 2023-05-24 ENCOUNTER — Ambulatory Visit (INDEPENDENT_AMBULATORY_CARE_PROVIDER_SITE_OTHER): Payer: Medicare HMO

## 2023-05-24 DIAGNOSIS — F419 Anxiety disorder, unspecified: Secondary | ICD-10-CM

## 2023-05-24 DIAGNOSIS — I10 Essential (primary) hypertension: Secondary | ICD-10-CM

## 2023-05-24 DIAGNOSIS — F418 Other specified anxiety disorders: Secondary | ICD-10-CM

## 2023-05-24 DIAGNOSIS — J432 Centrilobular emphysema: Secondary | ICD-10-CM

## 2023-05-24 DIAGNOSIS — F324 Major depressive disorder, single episode, in partial remission: Secondary | ICD-10-CM

## 2023-05-24 DIAGNOSIS — F064 Anxiety disorder due to known physiological condition: Secondary | ICD-10-CM

## 2023-05-24 NOTE — Patient Instructions (Signed)
Please call the care guide team at [phone] if you need to cancel or reschedule your appointment.   If you are experiencing a Mental Health or Behavioral Health Crisis or need someone to talk to, please call the Suicide and Crisis Lifeline: 988 call the [place] National Suicide Prevention Lifeline: (210) 464-3532 or TTY: 6121352509 TTY 581-818-0517) to talk to a trained counselor call 1-800-273-TALK (toll free, 24 hour hotline) go to [facility] [address], [place] [phone])   Following is a copy of the CCM Program Consent:  CCM service includes personalized support from designated clinical staff supervised by the physician, including individualized plan of care and coordination with other care providers 24/7 contact phone numbers for assistance for urgent and routine care needs. Service will only be billed when office clinical staff spend 20 minutes or more in a month to coordinate care. Only one practitioner may furnish and bill the service in a calendar month. The patient may stop CCM services at amy time (effective at the end of the month) by phone call to the office staff. The patient will be responsible for cost sharing (co-pay) or up to 20% of the service fee (after annual deductible is met)  Following is a copy of your full provider care plan:   Goals Addressed             This Visit's Progress    CCM Expected Outcome:  Monitor, Self-Manage and Reduce Symptoms of: Depression, Anxiety, Stress       Current Barriers:  Care Coordination needs related to resources in the community for help with mental health needs and community resources for financial support in a patient with anxiety, depression, and stress Chronic Disease Management support and education needs related to effective management of stress, anxiety, and depression  Planned Interventions: Evaluation of current treatment plan related to stress, anxiety, and depression  and patient's adherence to plan as established by provider.  Is working with the LCSW and states that has been helpful. She has made a bucket list and she also went to the [employer] ([facility] She says that she is interested in the socialization and events there. She has had some blessing recently with her neighbors who she did some sewing for give her some unexpected money for sewing and unexpected gift card from another couple for food.  She was especially thankful for this. She wants to get back into church. She misses the fellowship and the bible studies there. The patient was not having a good day. She woke up this morning to her air condition being out. The patient states that she took her last 110.00 to pay the repair person for the repair. She is discouraged and frustrated. She is feeling overwhelmed. Reached out to the LCSW who will call tomorrow to see if she can provide the patient with an emergent $50.00 gift card. Also the [facility] with the permission of manager has submitted an application for help from the [facility]. Will continue to monitor and follow up accordingly. Reflective listening and support given.  Advised patient to call the office for changes in stress, anxiety, and depression Provided education to patient re: working with the CCM team, support available with resources, ongoing outreaches for effective management of stress, anxiety, and depression Reviewed medications with patient and discussed compliance. The patient is compliant with her medications. Denies any medication needs at this time. Works with the pharm D on effective medication management. Reviewed scheduled/upcoming provider appointments  including 06-06-2023 at 2 pm Care Guide referral for resources in the community to help with food resources and financial constraints. Has been provided with information. The patient received information from the care guides but she states that none of those programs  can actually help her. Will continue to monitor for changes and new programs that may be a resource for the patient.  Social Work referral for ongoing support and education related to effective management of depression, anxiety, stress. The patient has talked with the LCSW. She knows she needs to take things slow. She knows she cannot return to the work she previously did. The patient knows the care team is available to assist with needs. Continues to work with the SW on a regular basis. After collaboration with the LCSW, the LCSW will reach out to the patient tomorrow for follow up and assistance.  Pharmacy referral for ongoing support and education of medication needs and management. Ongoing support and education from the pharm D. Discussed plans with patient for ongoing care management follow up and provided patient with direct contact information for care management team Advised patient to discuss changes in mood, anxiety, depression, or mental health needs with provider Screening for signs and symptoms of depression related to chronic disease state  Assessed social determinant of health barriers Discussed self care and doing hobbies that make the patient happy or going on events that do not cost money, or doing things with family and friends. Talked to the patient about [employer] as this is an online bible study group that may help also with Bible study resources.   The patient knows to call the [facility] in between outreaches for new needs or concerns.   Symptom Management: Take medications as prescribed   Attend all scheduled provider appointments Call provider office for new concerns or questions  call the Suicide and Crisis Lifeline: 988 call the [place] National Suicide Prevention Lifeline: (256) 293-7372 or TTY: 236-201-7273 TTY (989)082-3262) to talk to a trained counselor call 1-800-273-TALK (toll free, 24 hour hotline) if experiencing a Mental Health or Behavioral Health Crisis   Follow Up  Plan: Telephone follow up appointment with care management team member scheduled for: 07-12-2023 at 345 pm        CCM Expected Outcome:  Monitor, Self-Manage, and Reduce Symptoms of Hypertension       Current Barriers:  Chronic Disease Management support and education needs related to effective management of HTN BP Readings from Last 3 Encounters:  03/03/23 106/68  01/22/23 125/76  01/13/23 114/72    Planned Interventions: Evaluation of current treatment plan related to hypertension self management and patient's adherence to plan as established by provider. Her blood pressures are stable and she is doing well post surgery for lumbar radiculopathy. Denies any acute changes in her blood pressures. No new issues with CP. Will see the surgeon soon and hopefully she will be able to get a specialist to evaluate her hip pain. She thinks she may need a hip replacement. She is still having a lot of pain and discomfort in her back and hip. She sees Dr. Yetta Barre on 06-07-2023 and will discuss next steps. Education and support given. ;   Provided education to patient re: stroke prevention, s/s of heart attack and stroke; Reviewed prescribed diet heart healthy diet. The patient is compliant with a heart healthy diet.  Reviewed medications with patient and discussed importance of compliance. The patient is compliant with medications. Works with the pharm D on an ongoing basis;  Discussed  plans with patient for ongoing care management follow up and provided patient with direct contact information for care management team; Advised patient, providing education and rationale, to monitor blood pressure daily and record, calling PCP for findings outside established parameters;  Reviewed scheduled/upcoming provider appointments including: 06-06-2023 at 2 pm Advised patient to discuss changes in HTN or heart health with provider; Provided education on prescribed diet heart healthy ;  Discussed complications of poorly  controlled blood pressure such as heart disease, stroke, circulatory complications, vision complications, kidney impairment, sexual dysfunction;  Screening for signs and symptoms of depression related to chronic disease state;  Assessed social determinant of health barriers;   Symptom Management: Take medications as prescribed   Attend all scheduled provider appointments Call provider office for new concerns or questions  call the Suicide and Crisis Lifeline: 988 call the [place] National Suicide Prevention Lifeline: 5138047138 or TTY: (936)241-9642 TTY 2898719508) to talk to a trained counselor call 1-800-273-TALK (toll free, 24 hour hotline) if experiencing a Mental Health or Behavioral Health Crisis  check blood pressure weekly learn about high blood pressure call doctor for signs and symptoms of high blood pressure develop an action plan for high blood pressure keep all doctor appointments take medications for blood pressure exactly as prescribed report new symptoms to your doctor  Follow Up Plan: Telephone follow up appointment with care management team member scheduled for: 07-12-2023 at 345 pm       CCM:  Maintain, Monitor and Self-Manage Symptoms of COPD       Current Barriers:  Chronic Disease Management support and education needs related to effective COPD  Planned Interventions: Provided patient with basic written and verbal COPD education on self care/management/and exacerbation prevention. The patient is doing well post surgery on 01-21-2023 for a lumbar radiculopathy. She denies any new issues with her breathing at this time. Denies any issues with her breathing at this time. Knows how to effectively management. COPD is stable. Denies any new acute changes in her breathing. Did have to get her air conditioning fixed today as it went out over night and she has trouble breathing when it is hot outside. The patient states that she paid the remaining money she had to pay for the  repair. She is really down and out right now but she states that she is trying to remain positive.  Advised patient to track and manage COPD triggers. Is aware of things that exacerbate her condition. Provided written and verbal instructions on pursed lip breathing and utilized returned demonstration as teach back Provided instruction about proper use of medications used for management of COPD including inhalers Advised patient to self assesses COPD action plan zone and make appointment with provider if in the yellow zone for 48 hours without improvement Advised patient to engage in light exercise as tolerated 3-5 days a week to aid in the the management of COPD. The patient is doing some things outside and wants to be more active. She deals with chronic pain and will be released soon from the surgeon. She says likely she is going to have to have hip replacement surgery due to hip pain and discomfort.  Provided education about and advised patient to utilize infection prevention strategies to reduce risk of respiratory infection. The patient is mindful of her surroundings and is safe. The patient knows what worse looks like and to call for changes or needs.  Discussed the importance of adequate rest and management of fatigue with COPD Screening for signs and  symptoms of depression related to chronic disease state  Assessed social determinant of health barriers  Symptom Management: Take medications as prescribed   Attend all scheduled provider appointments Call provider office for new concerns or questions  call the Suicide and Crisis Lifeline: 988 call the [place] National Suicide Prevention Lifeline: 979 229 4692 or TTY: 613-598-0327 TTY 908-733-7143) to talk to a trained counselor call 1-800-273-TALK (toll free, 24 hour hotline) if experiencing a Mental Health or Behavioral Health Crisis  identify and remove indoor air pollutants limit outdoor activity during cold weather listen for public air  quality announcements every day develop a rescue plan eliminate symptom triggers at home follow rescue plan if symptoms flare-up  Follow Up Plan: Telephone follow up appointment with care management team member scheduled for: 07-12-2023 and 345 pm          Patient verbalizes understanding of instructions and care plan provided today and agrees to view in MyChart. Active MyChart status and patient understanding of how to access instructions and care plan via MyChart confirmed with patient.  Telephone follow up appointment with care management team member scheduled for: 07-12-2023 at 345 pm

## 2023-05-24 NOTE — Telephone Encounter (Signed)
   Patient: Victoria Pena [facility]  DOB: 1951/11/21  MRN: [id number]  Prolia benefits verified by [employer] Coverage Available: Pharmacy Prior authorization# [id number] APPROVED from 05/26/2023 to 12/20/2023.  Patient agreed to proceed with ordering and RX was sent to [facility].  Pharmacy has scheduled delivery of Prolia to the office on 05/26/23 with signature required.  If medication is not received on 05/26/23 we are to notify [facility] within 24 hours.  Jacklynn Bue, LPN

## 2023-05-24 NOTE — Chronic Care Management (AMB) (Signed)
Chronic Care Management   CCM RN Visit Note  05/24/2023 Name: Victoria Pena MRN: [id number] DOB: 08-15-1951  Subjective: Victoria Pena is a 72 y.o. year old female who is a primary care patient of Cox, Kirsten, MD. The patient was referred to the Chronic Care Management team for assistance with care management needs subsequent to provider initiation of CCM services and plan of care.    Today's Visit:  Engaged with patient by telephone for follow up visit.        Goals Addressed             This Visit's Progress    CCM Expected Outcome:  Monitor, Self-Manage and Reduce Symptoms of: Depression, Anxiety, Stress       Current Barriers:  Care Coordination needs related to resources in the community for help with mental health needs and community resources for financial support in a patient with anxiety, depression, and stress Chronic Disease Management support and education needs related to effective management of stress, anxiety, and depression  Planned Interventions: Evaluation of current treatment plan related to stress, anxiety, and depression and patient's adherence to plan as established by provider.  Is working with the LCSW and states that has been helpful. She has made a bucket list and she also [facility] [employer] [facility] She says that she is interested in the socialization and events there. She has had some blessing recently with her neighbors who she did some sewing for give her some unexpected money for sewing and unexpected gift card from another couple for food.  She was especially thankful for this. She wants to get back into church. She misses the fellowship and the bible studies there. The patient was not having a good day. She woke up this morning to her air condition being out. The patient states that she took her last 110.00 to pay the repair person for the repair. She is discouraged and frustrated. She is feeling overwhelmed. Reached out to the LCSW who will  call tomorrow to see if she can provide the patient with an emergent $50.00 gift card. Also the [facility] with the permission of manager has submitted an application for help from the [facility]. Will continue to monitor and follow up accordingly. Reflective listening and support given.  Advised patient to call the office for changes in stress, anxiety, and depression Provided education to patient re: working with the CCM team, support available with resources, ongoing outreaches for effective management of stress, anxiety, and depression Reviewed medications with patient and discussed compliance. The patient is compliant with her medications. Denies any medication needs at this time. Works with the pharm D on effective medication management. Reviewed scheduled/upcoming provider appointments including 06-06-2023 at 2 pm Care Guide referral for resources in the community to help with food resources and financial constraints. Has been provided with information. The patient received information from the care guides but she states that none of those programs can actually help her. Will continue to monitor for changes and new programs that may be a resource for the patient.  Social Work referral for ongoing support and education related to effective management of depression, anxiety, stress. The patient has talked with the LCSW. She knows she needs to take things slow. She knows she cannot return to the work she previously did. The patient knows the care team is available to assist with needs. Continues to work with the SW on a regular basis. After collaboration with the  LCSW, the LCSW will reach out to the patient tomorrow for follow up and assistance.  Pharmacy referral for ongoing support and education of medication needs and management. Ongoing support and education from the pharm D. Discussed plans with patient for ongoing care management follow up and provided patient with direct contact  information for care management team Advised patient to discuss changes in mood, anxiety, depression, or mental health needs with provider Screening for signs and symptoms of depression related to chronic disease state  Assessed social determinant of health barriers Discussed self care and doing hobbies that make the patient happy or going on events that do not cost money, or doing things with family and friends. Talked to the patient about [employer] as this is an online bible study group that may help also with Bible study resources.   The patient knows to call the [facility] in between outreaches for new needs or concerns.   Symptom Management: Take medications as prescribed   Attend all scheduled provider appointments Call provider office for new concerns or questions  call the Suicide and Crisis Lifeline: 988 call the [place] National Suicide Prevention Lifeline: 786 841 9068 or TTY: (505)295-8894 TTY 925-466-5448) to talk to a trained counselor call 1-800-273-TALK (toll free, 24 hour hotline) if experiencing a Mental Health or Behavioral Health Crisis   Follow Up Plan: Telephone follow up appointment with care management team member scheduled for: 07-12-2023 at 345 pm        CCM Expected Outcome:  Monitor, Self-Manage, and Reduce Symptoms of Hypertension       Current Barriers:  Chronic Disease Management support and education needs related to effective management of HTN BP Readings from Last 3 Encounters:  03/03/23 106/68  01/22/23 125/76  01/13/23 114/72    Planned Interventions: Evaluation of current treatment plan related to hypertension self management and patient's adherence to plan as established by provider. Her blood pressures are stable and she is doing well post surgery for lumbar radiculopathy. Denies any acute changes in her blood pressures. No new issues with CP. Will see the surgeon soon and hopefully she will be able to get a specialist to evaluate her hip pain. She thinks  she may need a hip replacement. She is still having a lot of pain and discomfort in her back and hip. She sees Dr. Yetta Barre on 06-07-2023 and will discuss next steps. Education and support given. ;   Provided education to patient re: stroke prevention, s/s of heart attack and stroke; Reviewed prescribed diet heart healthy diet. The patient is compliant with a heart healthy diet.  Reviewed medications with patient and discussed importance of compliance. The patient is compliant with medications. Works with the pharm D on an ongoing basis;  Discussed plans with patient for ongoing care management follow up and provided patient with direct contact information for care management team; Advised patient, providing education and rationale, to monitor blood pressure daily and record, calling PCP for findings outside established parameters;  Reviewed scheduled/upcoming provider appointments including: 06-06-2023 at 2 pm Advised patient to discuss changes in HTN or heart health with provider; Provided education on prescribed diet heart healthy ;  Discussed complications of poorly controlled blood pressure such as heart disease, stroke, circulatory complications, vision complications, kidney impairment, sexual dysfunction;  Screening for signs and symptoms of depression related to chronic disease state;  Assessed social determinant of health barriers;   Symptom Management: Take medications as prescribed   Attend all scheduled provider appointments Call provider office for new concerns  or questions  call the Suicide and Crisis Lifeline: 988 call the [place] National Suicide Prevention Lifeline: 782-474-7732 or TTY: 541-273-7636 TTY (858)397-5922) to talk to a trained counselor call 1-800-273-TALK (toll free, 24 hour hotline) if experiencing a Mental Health or Behavioral Health Crisis  check blood pressure weekly learn about high blood pressure call doctor for signs and symptoms of high blood pressure develop an  action plan for high blood pressure keep all doctor appointments take medications for blood pressure exactly as prescribed report new symptoms to your doctor  Follow Up Plan: Telephone follow up appointment with care management team member scheduled for: 07-12-2023 at 345 pm       CCM:  Maintain, Monitor and Self-Manage Symptoms of COPD       Current Barriers:  Chronic Disease Management support and education needs related to effective COPD  Planned Interventions: Provided patient with basic written and verbal COPD education on self care/management/and exacerbation prevention. The patient is doing well post surgery on 01-21-2023 for a lumbar radiculopathy. She denies any new issues with her breathing at this time. Denies any issues with her breathing at this time. Knows how to effectively management. COPD is stable. Denies any new acute changes in her breathing. Did have to get her air conditioning fixed today as it went out over night and she has trouble breathing when it is hot outside. The patient states that she paid the remaining money she had to pay for the repair. She is really down and out right now but she states that she is trying to remain positive.  Advised patient to track and manage COPD triggers. Is aware of things that exacerbate her condition. Provided written and verbal instructions on pursed lip breathing and utilized returned demonstration as teach back Provided instruction about proper use of medications used for management of COPD including inhalers Advised patient to self assesses COPD action plan zone and make appointment with provider if in the yellow zone for 48 hours without improvement Advised patient to engage in light exercise as tolerated 3-5 days a week to aid in the the management of COPD. The patient is doing some things outside and wants to be more active. She deals with chronic pain and will be released soon from the surgeon. She says likely she is going to have to  have hip replacement surgery due to hip pain and discomfort.  Provided education about and advised patient to utilize infection prevention strategies to reduce risk of respiratory infection. The patient is mindful of her surroundings and is safe. The patient knows what worse looks like and to call for changes or needs.  Discussed the importance of adequate rest and management of fatigue with COPD Screening for signs and symptoms of depression related to chronic disease state  Assessed social determinant of health barriers  Symptom Management: Take medications as prescribed   Attend all scheduled provider appointments Call provider office for new concerns or questions  call the Suicide and Crisis Lifeline: 988 call the [place] National Suicide Prevention Lifeline: (219)604-2948 or TTY: (343)577-4244 TTY 713-340-7484) to talk to a trained counselor call 1-800-273-TALK (toll free, 24 hour hotline) if experiencing a Mental Health or Behavioral Health Crisis  identify and remove indoor air pollutants limit outdoor activity during cold weather listen for public air quality announcements every day develop a rescue plan eliminate symptom triggers at home follow rescue plan if symptoms flare-up  Follow Up Plan: Telephone follow up appointment with care management team member scheduled for: 07-12-2023  and 345 pm          Plan:Telephone follow up appointment with care management team member scheduled for:  07-12-2023 at 345 pm  Alto Denver RN, MSN, CCM RN Care Manager  Chronic Care Management Direct Number: [phone]

## 2023-05-25 ENCOUNTER — Ambulatory Visit: Payer: Self-pay | Admitting: *Deleted

## 2023-05-25 NOTE — Patient Outreach (Signed)
  Care Coordination   Follow Up Visit Note   05/25/2023 Name: Victoria Pena MRN: [id number] DOB: [birth year]  Victoria Pena is a 72 y.o. year old female who sees Cox, Fritzi Mandes, MD for primary care. I spoke with  Marko Stai Frew by phone today.  What matters to the patients health and wellness today?  Had to pay for HVAC repair and money is running out.    Goals Addressed               This Visit's Progress     Financial stress minimized        Activities and task to complete in order to accomplish goals.   Speak with your landlord about the out of pocket expense you paid for the HVAC repair Continue to manage your budget/finances and seek ways to cut cost where you possibly can Use the gift card donation to aide in paying essential bills/gas,etc  Inquire with your DSS/Medicaid office about possible eligiblity for full Medicaid (as opposed to current partial Medicaid that only pays your premium) Attend upcoming visits with back MD, PCP and Pain clinic `        Patient Stated she feels overwhelmed sometimes and has depression issues. She has challenges with meeting daily care needs (pt-stated)        Activities and task to complete in order to accomplish goals.   Glad to hear all the progress and positive news today Review your new [employer] Dual plan benefits- great that it will provide you healthy foods/card as well as dental, vision and more! Plans pending for Pain Clinic visit Keep journaling, sewing, reading, walking (incorporate daughter joining) and other good self care activities Continue with compliance of taking medication prescribed by Doctor- alert them if stomach issues continue Continue wearing your stimulator for back- glad you are feeling some relief and plans for follow up 06/07/23 with Neurosurgeon  Continue with your activities including sewing, quilting, gardening, reading, daily walking,etc Consider seeking opportunities to connect with others through church,  [employer], [employer] [facility], etc Consider potential part-time employment options when you are ready Continue with journaling and/or the workbook mentioned for self growth/forgiveness Reach out to friends/neighbors for connecting and doing things together- save on gas money by carpooling  Consider [employer] transport  Review the [employer] calendar and consider the classes- sewing?          SDOH assessments and interventions completed:  Yes  SDOH Interventions Today    Flowsheet Row Most Recent Value  SDOH Interventions   Financial Strain Interventions Other (Comment)  [CSW completed FAF assessment and has requested a one-time gift card donation to assist with bills this month]        Care Coordination Interventions:  Yes, provided  Interventions Today    Flowsheet Row Most Recent Value  Chronic Disease   Chronic disease during today's visit Other  [financial stress]  General Interventions   General Interventions Discussed/Reviewed [employer]  Education Interventions   Education Provided Provided Education  Provided [employer], [employer] pt inquire with Careers adviser about full eligibilty? also, to inquire with landlord about reimbursement of HVAC expense]  Mental Health Interventions   Mental Health Discussed/Reviewed Coping Strategies       Follow up plan: Follow up call scheduled for 06/08/23    Encounter Outcome:  Pt. Visit Completed

## 2023-05-25 NOTE — Patient Instructions (Signed)
Visit Information  Thank you for taking time to visit with me today. Please don't hesitate to contact me if I can be of assistance to you.   Following are the goals we discussed today:   Goals Addressed               This Visit's Progress     Financial stress minimized        Activities and task to complete in order to accomplish goals.   Speak with your landlord about the out of pocket expense you paid for the HVAC repair Continue to manage your budget/finances and seek ways to cut cost where you possibly can Use the gift card donation to aide in paying essential bills/gas,etc  Inquire with your DSS/Medicaid office about possible eligiblity for full Medicaid (as opposed to current partial Medicaid that only pays your premium) Attend upcoming visits with back MD, PCP and Pain clinic `        Patient Stated she feels overwhelmed sometimes and has depression issues. She has challenges with meeting daily care needs (pt-stated)        Activities and task to complete in order to accomplish goals.   Glad to hear all the progress and positive news today Review your new [employer] Dual plan benefits- great that it will provide you healthy foods/card as well as dental, vision and more! Plans pending for Pain Clinic visit Keep journaling, sewing, reading, walking (incorporate daughter joining) and other good self care activities Continue with compliance of taking medication prescribed by Doctor- alert them if stomach issues continue Continue wearing your stimulator for back- glad you are feeling some relief and plans for follow up 06/07/23 with Neurosurgeon  Continue with your activities including sewing, quilting, gardening, reading, daily walking,etc Consider seeking opportunities to connect with others through church, [employer], [employer], etc Consider potential part-time employment options when you are ready Continue with journaling and/or the workbook mentioned for  self growth/forgiveness Reach out to friends/neighbors for connecting and doing things together- save on gas money by carpooling  Consider [employer] transport  Review the [employer] [facility] calendar and consider the classes- sewing?          Our next appointment is by telephone on 06/08/23 Please call the care guide team at [phone] if you need to cancel or reschedule your appointment.   If you are experiencing a Mental Health or Behavioral Health Crisis or need someone to talk to, please call the Suicide and Crisis Lifeline: 988 call 911   The patient verbalized understanding of instructions, educational materials, and care plan provided today and DECLINED offer to receive copy of patient instructions, educational materials, and care plan.   Telephone follow up appointment with care management team member scheduled for:06/08/23  Reece Levy, MSW, LCSW Clinical Social Worker [employer] [phone]

## 2023-05-26 ENCOUNTER — Telehealth: Payer: Self-pay

## 2023-05-26 NOTE — Telephone Encounter (Signed)
Prolia has been received from the [facility]. Patient was informed and patient is scheduled for an appointment anyways on 06/08/23 and wants to get it done then.

## 2023-06-06 ENCOUNTER — Ambulatory Visit: Payer: Medicare HMO | Admitting: Family Medicine

## 2023-06-07 DIAGNOSIS — M4317 Spondylolisthesis, lumbosacral region: Secondary | ICD-10-CM | POA: Diagnosis not present

## 2023-06-07 DIAGNOSIS — Z6829 Body mass index (BMI) 29.0-29.9, adult: Secondary | ICD-10-CM | POA: Diagnosis not present

## 2023-06-07 DIAGNOSIS — M48062 Spinal stenosis, lumbar region with neurogenic claudication: Secondary | ICD-10-CM | POA: Diagnosis not present

## 2023-06-07 NOTE — Progress Notes (Signed)
Subjective:  Patient ID: Victoria Pena, female    DOB: 01-08-1951  Age: 72 y.o. MRN: [id number]  Chief Complaint  Patient presents with   Hypertension   Hyperlipidemia    HPI Depression/anxiety: Patient is taking wellbutrin 150 mg daily, lexapro 10 mg daily, xanax 0.5 mg BID PRN.  Hyperlipidemia:  Currently taking Atorvastatin 40 mg daily.  Osteoporosis: Prolia 60 mg injection every 6 months.  Hypertension: Losartan 50 mg daily.  Fibromyalgia: Lyrica 150 mg BID, Oxycodone 10 mg TID.  Hypothyroidism: Levothyroidism 125 mcg daily     06/08/2023    3:20 PM 03/01/2023    3:00 PM 12/30/2022    1:31 PM 12/29/2022    1:24 PM 12/29/2022    1:02 PM  Depression screen PHQ 2/9  Decreased Interest 3 1 2   0  Down, Depressed, Hopeless 3 0 0  1  PHQ - 2 Score 6 1 2  1   Altered sleeping 3 1 3  1   Tired, decreased energy 3 1 3  1   Change in appetite 3 0 0  0  Feeling bad or failure about yourself  0 0 1  0  Trouble concentrating 2 0 1  0  Moving slowly or fidgety/restless 0 0 0  0  Suicidal thoughts 0 0 0  0  PHQ-9 Score 17 3 10  3   Difficult doing work/chores Extremely dIfficult Somewhat difficult Very difficult Somewhat difficult         06/08/2023    3:20 PM  Fall Risk   Falls in the past year? 0  Number falls in past yr: 0  Injury with Fall? 0  Risk for fall due to : Impaired balance/gait;Impaired mobility  Follow up Falls prevention discussed    Patient Care Team: Blane Ohara, MD as PCP - General (Family Medicine) Zettie Pho, [facility] (Inactive) (Pharmacist) Buck Mam, LCSW as Social Worker [place], Sydnee Cabal as [employer] [employer] [facility] (Licensed Clinical Social Worker) Marlowe Sax, RN as Case Manager (General Practice)   Review of Systems  Constitutional:  Negative for chills, fatigue and fever.  HENT:  Negative for congestion, ear pain, rhinorrhea and sore throat.   Respiratory:  Negative for cough and shortness of breath.    Cardiovascular:  Negative for chest pain.  Gastrointestinal:  Positive for abdominal distention and abdominal pain. Negative for constipation, diarrhea, nausea and vomiting.  Genitourinary:  Negative for dysuria and urgency.  Musculoskeletal:  Positive for back pain. Negative for myalgias.  Neurological:  Negative for dizziness, weakness, light-headedness and headaches.  Psychiatric/Behavioral:  Negative for dysphoric mood. The patient is not nervous/anxious.     Current Outpatient Medications on File Prior to Visit  Medication Sig Dispense Refill   ALPRAZolam (XANAX) 0.5 MG tablet TAKE 1 TABLET(0.5 MG) BY MOUTH TWICE DAILY AS NEEDED FOR ANXIETY 60 tablet 0   atorvastatin (LIPITOR) 40 MG tablet TAKE 1 TABLET(40 MG) BY MOUTH DAILY (Patient taking differently: Take 40 mg by mouth daily.) 90 tablet 2   Calcium Carb-Cholecalciferol (CALCIUM 500 + D3) 500-5 MG-MCG TABS Take 3 tablets by mouth daily. (Patient taking differently: Take 1 tablet by mouth in the morning and at bedtime.) 100 tablet 5   calcium carbonate (TUMS - DOSED IN MG ELEMENTAL CALCIUM) 500 MG chewable tablet Chew 1 tablet by mouth 2 (two) times daily as needed for indigestion or heartburn.     denosumab (PROLIA) 60 MG/ML SOSY injection Inject 60 mg into the skin every 6 (six)  months. 180 mL 1   EPINEPHrine 0.3 mg/0.3 mL IJ SOAJ injection Inject 0.3 mg into the muscle as needed for anaphylaxis. 1 each 1   escitalopram (LEXAPRO) 10 MG tablet Take 1 tablet (10 mg total) by mouth daily. 90 tablet 1   fluticasone (FLONASE) 50 MCG/ACT nasal spray Place 2 sprays into both nostrils daily. (Patient taking differently: Place 2 sprays into both nostrils at bedtime.) 16 g 6   levothyroxine (SYNTHROID) 125 MCG tablet Take 1 tablet (125 mcg total) by mouth daily before breakfast. TAKE 1 TABLET(125 MCG) BY MOUTH DAILY Strength: 125 mcg 90 tablet 0   losartan (COZAAR) 50 MG tablet Take 1 tablet (50 mg total) by mouth daily. 90 tablet 1    metroNIDAZOLE (METROGEL) 0.75 % gel Apply 1 application topically 2 (two) times daily. (Patient taking differently: Apply 1 application  topically 2 (two) times daily as needed (rosacea).) 45 g 3   Multiple Vitamin (MULTIVITAMIN WITH MINERALS) TABS tablet Take 1 tablet by mouth in the morning and at bedtime.     mupirocin ointment (BACTROBAN) 2 % Apply 1 Application topically 2 (two) times daily. 22 g 0   pantoprazole (PROTONIX) 40 MG tablet Take 1 tablet (40 mg total) by mouth daily. 90 tablet 2   pregabalin (LYRICA) 150 MG capsule Take 1 capsule (150 mg total) by mouth 2 (two) times daily. 60 capsule 3   Spacer/Aero-Holding Chambers DEVI 1 each by Does not apply route daily. 1 each 0   trolamine salicylate (ASPERCREME) 10 % cream Apply 1 Application topically as needed for muscle pain.     No current facility-administered medications on file prior to visit.   Past Medical History:  Diagnosis Date   Acquired spondylolisthesis 05/12/2015   Adenocarcinoma of right lung, stage 1 (HCC) 07/26/2016   Anxiety    Arthritis    Arthropathy of lumbar facet joint 04/11/2019   Formatting of this note might be different from the original. Added automatically from request for surgery [id number]   Atherosclerosis of aorta (HCC) 07/08/2021   Benign essential hypertension 12/27/2016   BMI 30.0-30.9,adult 12/23/2020   COPD (chronic obstructive pulmonary disease) (HCC)    pt denies   Current mild episode of major depressive disorder (HCC) 03/24/2020   Daytime sleepiness 08/03/2021   Depression    Fibromyalgia 03/24/2020   Gastroesophageal reflux disease without esophagitis 03/24/2020   Graves disease 12/27/2016   Hypertension    Osteoporosis 08/03/2021   Pericardial cyst 02/02/2021   Postoperative hypothyroidism 01/17/2015   Reactive airways dysfunction syndrome, unspecified asthma severity, uncomplicated (HCC) 09/06/2015   S/P lobectomy of lung 07/26/2015   Formatting of this note might be different  from the original. Right upper   S/P lumbar spinal fusion 05/11/2018   Thyroid disease    Toe ulcer, left, limited to breakdown of skin (HCC) 12/23/2020   Past Surgical History:  Procedure Laterality Date   ANKLE SURGERY     bilateral   APPENDECTOMY     BREAST BIOPSY Left 02/25/2020   BREAST BIOPSY Right 08/17/2018   BREAST EXCISIONAL BIOPSY Right    unsure when but marked with scar marker   CHOLECYSTECTOMY     EYE MUSCLE SURGERY Bilateral    4-5 years ago   LAMINECTOMY WITH POSTERIOR LATERAL ARTHRODESIS LEVEL 2 N/A 01/21/2023   Procedure: Laminectomy  - L1-L2 - L2-L3 with non-instrumented posterior lateral fusion;  Surgeon: Tia Alert, MD;  Location: [facility] OR;  Service: Neurosurgery;  Laterality: N/A;   LUMBAR FUSION  2019   L4, 5, 6   REPLACEMENT TOTAL KNEE Left    THYROIDECTOMY      Family History  Problem Relation Age of Onset   Cancer Mother    Cancer Father    Cancer Brother    Autism Grandson    Breast cancer Neg Hx    Social History   Socioeconomic History   Marital status: Widowed    Spouse name: Not on file   Number of children: Not on file   Years of education: Not on file   Highest education level: GED or equivalent  Occupational History   Not on file  Tobacco Use   Smoking status: Former    Packs/day: 1.50    Years: 35.00    Additional pack years: 0.00    Total pack years: 52.50    Types: Cigarettes    Quit date: 07/27/1999    Years since quitting: 23.8   Smokeless tobacco: Never  Vaping Use   Vaping Use: Never used  Substance and Sexual Activity   Alcohol use: No   Drug use: No   Sexual activity: Not Currently  Other Topics Concern   Not on file  Social History Narrative   Not on file   Social Determinants of Health   Financial Resource Strain: Medium Risk (06/02/2023)   Overall Financial Resource Strain (CARDIA)    Difficulty of Paying Living Expenses: Somewhat hard  Food Insecurity: Food Insecurity Present (06/02/2023)   Hunger Vital  Sign    Worried About Running Out of Food in the Last Year: Sometimes true    Ran Out of Food in the Last Year: Sometimes true  Transportation Needs: No Transportation Needs (06/02/2023)   PRAPARE - Administrator, Civil Service (Medical): No    Lack of Transportation (Non-Medical): No  Physical Activity: Insufficiently Active (06/02/2023)   Exercise Vital Sign    Days of Exercise per Week: 5 days    Minutes of Exercise per Session: 10 min  Stress: Stress Concern Present (06/02/2023)   [employer] of Occupational Health - Occupational Stress Questionnaire    Feeling of Stress : To some extent  Social Connections: Socially Isolated (06/02/2023)   Social Connection and Isolation Panel [NHANES]    Frequency of Communication with Friends and Family: More than three times a week    Frequency of Social Gatherings with Friends and Family: Never    Attends Religious Services: Never    Database administrator or Organizations: No    Attends Banker Meetings: Never    Marital Status: Widowed    Objective:  BP 100/70   Pulse 74   Temp 98.4 F (36.9 C)   Resp 12   Ht 5\' 5"  (1.651 m)   Wt 179 lb (81.2 kg)   LMP  (LMP Unknown)   SpO2 98%   BMI 29.79 kg/m      06/08/2023    2:55 PM 03/03/2023    1:54 PM 01/22/2023    7:28 AM  BP/Weight  Systolic BP 100 106 125  Diastolic BP 70 68 76  Wt. (Lbs) 179 178   BMI 29.79 kg/m2 29.62 kg/m2     Physical Exam Vitals reviewed.  Constitutional:      Appearance: Normal appearance. She is normal weight.  Neck:     Vascular: No carotid bruit.  Cardiovascular:     Rate and Rhythm: Normal rate and regular rhythm.     Heart sounds: Normal heart sounds.  Pulmonary:     Effort: Pulmonary effort is normal. No respiratory distress.     Breath sounds: Normal breath sounds.  Abdominal:     General: Abdomen is flat. Bowel sounds are normal.     Palpations: Abdomen is soft.     Tenderness: There is no abdominal tenderness.   Neurological:     Mental Status: She is alert and oriented to person, place, and time.  Psychiatric:        Mood and Affect: Mood normal.        Behavior: Behavior normal.     Diabetic Foot Exam - Simple   No data filed      Lab Results  Component Value Date   WBC 4.5 06/09/2023   HGB 12.7 06/09/2023   HCT 40.2 06/09/2023   PLT 212 06/09/2023   GLUCOSE 82 06/09/2023   CHOL 122 06/09/2023   TRIG 46 06/09/2023   HDL 69 06/09/2023   LDLCALC 42 06/09/2023   ALT 10 06/09/2023   AST 15 06/09/2023   NA 144 06/09/2023   K 4.9 06/09/2023   CL 107 (H) 06/09/2023   CREATININE 0.89 06/09/2023   BUN 18 06/09/2023   CO2 25 06/09/2023   TSH 0.749 06/09/2023   INR 1.0 12/31/2022      Assessment & Plan:    Benign essential hypertension Assessment & Plan: Well controlled.  No changes to medicines. Losartan 50 mg daily.  Continue to work on eating a healthy diet and exercise.    Orders: -     CBC with Differential/Platelet; Future -     Comprehensive metabolic panel; Future  Centrilobular emphysema (HCC) Assessment & Plan: Stable   Gastroesophageal reflux disease without esophagitis Assessment & Plan: Add Famotidine 40 mg once a day and continue with Protonix 40 mg daily  Orders: -     Famotidine; Take 1 tablet (40 mg total) by mouth daily.  Dispense: 90 tablet; Refill: 1  Fibromyalgia Assessment & Plan: The current medical regimen is effective;  continue present plan and medications.   Lyrica 150 mg BID, Oxycodone 10 mg TID.     Mixed hyperlipidemia Assessment & Plan: Well controlled.  No changes to medicines. Currently taking Atorvastatin 40 mg daily.  Continue to work on eating a healthy diet and exercise.    Orders: -     Lipid panel; Future  Depression with anxiety Assessment & Plan: Increased Wellbutrin xl 150 to 300 mg daily.  Orders: -     TSH; Future -     buPROPion HCl ER (XL); Take 1 tablet (300 mg total) by mouth daily.  Dispense: 90  tablet; Refill: 0  History of UTI Assessment & Plan: Check UA Order Urine Culture  Orders: -     POCT URINALYSIS DIP (CLINITEK) -     Urine Culture  Age-related osteoporosis without current pathological fracture Assessment & Plan: Continue with Prolia 60 mg IM every 6 months  Orders: -     Denosumab  Chronic midline low back pain without sciatica -     oxyCODONE HCl; Take 1 tablet (10 mg total) by mouth 3 (three) times daily as needed ((score 7 to 10)).  Dispense: 90 tablet; Refill: 0     Meds ordered this encounter  Medications   buPROPion (WELLBUTRIN XL) 300 MG 24 hr tablet    Sig: Take 1 tablet (300 mg total) by mouth daily.    Dispense:  90 tablet    Refill:  0   famotidine (  PEPCID) 40 MG tablet    Sig: Take 1 tablet (40 mg total) by mouth daily.    Dispense:  90 tablet    Refill:  1   Oxycodone HCl 10 MG TABS    Sig: Take 1 tablet (10 mg total) by mouth 3 (three) times daily as needed ((score 7 to 10)).    Dispense:  90 tablet    Refill:  0   denosumab (PROLIA) injection 60 mg    Order Specific Question:   Patient is enrolled in [facility] program for this medication and I have provided a copy of the Prolia Medication Guide and Patient Brochure.    Answer:   No    Order Specific Question:   I have reviewed with the patient the information in the Prolia Medication Guide and Patient Counseling Chart including the serious risks of Prolia and symptoms of each risk.    Answer:   Yes    Order Specific Question:   I have advised the patient to seek medical attention if they have signs or symptoms of any of the serious risks.    Answer:   Yes    Orders Placed This Encounter  Procedures   Urine Culture   CBC with Differential/Platelet   Comprehensive metabolic panel   Lipid panel   TSH   POCT URINALYSIS DIP (CLINITEK)     Follow-up: Return in about 3 months (around 09/08/2023) for chronic, fasting.   I,Katherina A Bramblett,acting as a scribe for Blane Ohara, MD.,have  documented all relevant documentation on the behalf of Blane Ohara, MD,as directed by  Blane Ohara, MD while in the presence of Blane Ohara, MD.   Clayborn Bigness I Leal-Borjas,acting as a scribe for Blane Ohara, MD.,have documented all relevant documentation on the behalf of Blane Ohara, MD,as directed by  Blane Ohara, MD while in the presence of Blane Ohara, MD.    An After Visit Summary was printed and given to the patient.  I attest that I have reviewed this visit and agree with the plan scribed by my staff.   Blane Ohara, MD [facility] [phone]

## 2023-06-08 ENCOUNTER — Ambulatory Visit (INDEPENDENT_AMBULATORY_CARE_PROVIDER_SITE_OTHER): Payer: Medicare HMO | Admitting: Family Medicine

## 2023-06-08 ENCOUNTER — Encounter: Payer: Medicare HMO | Admitting: *Deleted

## 2023-06-08 VITALS — BP 100/70 | HR 74 | Temp 98.4°F | Resp 12 | Ht 65.0 in | Wt 179.0 lb

## 2023-06-08 DIAGNOSIS — F418 Other specified anxiety disorders: Secondary | ICD-10-CM

## 2023-06-08 DIAGNOSIS — E782 Mixed hyperlipidemia: Secondary | ICD-10-CM | POA: Diagnosis not present

## 2023-06-08 DIAGNOSIS — I10 Essential (primary) hypertension: Secondary | ICD-10-CM | POA: Diagnosis not present

## 2023-06-08 DIAGNOSIS — Z87440 Personal history of urinary (tract) infections: Secondary | ICD-10-CM | POA: Insufficient documentation

## 2023-06-08 DIAGNOSIS — J432 Centrilobular emphysema: Secondary | ICD-10-CM

## 2023-06-08 DIAGNOSIS — K219 Gastro-esophageal reflux disease without esophagitis: Secondary | ICD-10-CM | POA: Diagnosis not present

## 2023-06-08 DIAGNOSIS — M545 Low back pain, unspecified: Secondary | ICD-10-CM | POA: Diagnosis not present

## 2023-06-08 DIAGNOSIS — M797 Fibromyalgia: Secondary | ICD-10-CM | POA: Diagnosis not present

## 2023-06-08 DIAGNOSIS — M810 Age-related osteoporosis without current pathological fracture: Secondary | ICD-10-CM | POA: Diagnosis not present

## 2023-06-08 DIAGNOSIS — G8929 Other chronic pain: Secondary | ICD-10-CM

## 2023-06-08 LAB — POCT URINALYSIS DIP (CLINITEK)
Bilirubin, UA: NEGATIVE
Blood, UA: NEGATIVE
Glucose, UA: NEGATIVE mg/dL
Ketones, POC UA: NEGATIVE mg/dL
Nitrite, UA: NEGATIVE
POC PROTEIN,UA: NEGATIVE
Spec Grav, UA: 1.02 (ref 1.010–1.025)
Urobilinogen, UA: 0.2 E.U./dL
pH, UA: 6 (ref 5.0–8.0)

## 2023-06-08 MED ORDER — OXYCODONE HCL 10 MG PO TABS
10.0000 mg | ORAL_TABLET | Freq: Three times a day (TID) | ORAL | 0 refills | Status: DC | PRN
Start: 2023-06-08 — End: 2023-07-05

## 2023-06-08 MED ORDER — BUPROPION HCL ER (XL) 300 MG PO TB24
300.0000 mg | ORAL_TABLET | Freq: Every day | ORAL | 0 refills | Status: DC
Start: 2023-06-08 — End: 2023-08-25

## 2023-06-08 MED ORDER — DENOSUMAB 60 MG/ML ~~LOC~~ SOSY
60.0000 mg | PREFILLED_SYRINGE | Freq: Once | SUBCUTANEOUS | Status: AC
Start: 2023-06-08 — End: 2023-06-08
  Administered 2023-06-08: 60 mg via SUBCUTANEOUS

## 2023-06-08 MED ORDER — FAMOTIDINE 40 MG PO TABS
40.0000 mg | ORAL_TABLET | Freq: Every day | ORAL | 1 refills | Status: DC
Start: 2023-06-08 — End: 2023-12-01

## 2023-06-08 NOTE — Assessment & Plan Note (Signed)
Increased Wellbutrin xl 150 to 300 mg daily.

## 2023-06-08 NOTE — Assessment & Plan Note (Signed)
Stable

## 2023-06-08 NOTE — Assessment & Plan Note (Signed)
The current medical regimen is effective;  continue present plan and medications.   Lyrica 150 mg BID, Oxycodone 10 mg TID.

## 2023-06-08 NOTE — Assessment & Plan Note (Signed)
Well controlled.  No changes to medicines. Currently taking Atorvastatin 40 mg daily.  Continue to work on eating a healthy diet and exercise.

## 2023-06-08 NOTE — Assessment & Plan Note (Signed)
Add Famotidine 40 mg once a day and continue with Protonix 40 mg daily

## 2023-06-08 NOTE — Assessment & Plan Note (Signed)
Check UA Order Urine Culture

## 2023-06-08 NOTE — Assessment & Plan Note (Signed)
Well controlled.  No changes to medicines. Losartan 50 mg daily.  Continue to work on eating a healthy diet and exercise.

## 2023-06-09 ENCOUNTER — Ambulatory Visit: Payer: Self-pay | Admitting: *Deleted

## 2023-06-09 ENCOUNTER — Other Ambulatory Visit: Payer: Medicare HMO

## 2023-06-09 DIAGNOSIS — E782 Mixed hyperlipidemia: Secondary | ICD-10-CM | POA: Diagnosis not present

## 2023-06-09 DIAGNOSIS — I10 Essential (primary) hypertension: Secondary | ICD-10-CM

## 2023-06-09 DIAGNOSIS — F418 Other specified anxiety disorders: Secondary | ICD-10-CM

## 2023-06-09 NOTE — Patient Instructions (Signed)
Visit Information  Thank you for taking time to visit with me today. Please don't hesitate to contact me if I can be of assistance to you.   Following are the goals we discussed today:   Goals Addressed             This Visit's Progress    Depression minimized       Activities and task to complete in order to accomplish goals.   Continue Wellbutrin dose prescribed by Dr Sedalia Muta Continue to do things that you enjoy- sewing, etc Continue to manage your budget/finances and seek ways to cut cost where you possibly can Consider journaling Inquire with your DSS/Medicaid office about possible eligiblity for full Medicaid (as opposed to current partial Medicaid that only pays your premium)  `          Our next appointment is by telephone on 07/06/23 3:30pm  Please call the care guide team at [phone] if you need to cancel or reschedule your appointment.   If you are experiencing a Mental Health or Behavioral Health Crisis or need someone to talk to, please call the Suicide and Crisis Lifeline: 988 call 911   The patient verbalized understanding of instructions, educational materials, and care plan provided today and DECLINED offer to receive copy of patient instructions, educational materials, and care plan.   Telephone follow up appointment with care management team member scheduled for:07/06/23  Reece Levy, MSW, LCSW Clinical Social Worker [facility] [phone]

## 2023-06-09 NOTE — Patient Outreach (Signed)
  Care Coordination   Follow Up Visit Note   06/09/2023 Name: Victoria Pena MRN: [id number] DOB: [birth year]  Victoria Pena is a 72 y.o. year old female who sees Cox, Fritzi Mandes, MD for primary care. I spoke with  Victoria Pena by phone today.  What matters to the patients health and wellness today?  Been busy with appointments.    Goals Addressed             This Visit's Progress    Depression minimized       Activities and task to complete in order to accomplish goals.   Continue Wellbutrin dose prescribed by Dr Sedalia Muta Continue to do things that you enjoy- sewing, etc Continue to manage your budget/finances and seek ways to cut cost where you possibly can Consider journaling Inquire with your DSS/Medicaid office about possible eligiblity for full Medicaid (as opposed to current partial Medicaid that only pays your premium)  `          SDOH assessments and interventions completed:  Yes     Care Coordination Interventions:  Yes, provided  Interventions Today    Flowsheet Row Most Recent Value  Chronic Disease   Chronic disease during today's visit Other  [chronic pain- saw Dr Yetta Barre and was told its not  her hip- plans back injection 06/27/23]  General Interventions   General Interventions Discussed/Reviewed [facility], Doctor Visits  [Pt shared she went to see PCP and had good conversation about her depression- starting Wellbutrin RX]  Doctor Visits Discussed/Reviewed Doctor Visits Reviewed  [Pt saw PCP and Back MD with good reports/interventions. Bone growth from the stimulator device]  Education Interventions   Provided Verbal Education On [facility]  [pt to inquire with DSS/medicaid worker about full eligibilty? also, to inquire with landlord about reimbursement of HVAC expense]  Mental Health Interventions   Mental Health Discussed/Reviewed Mental Health Discussed, Coping Strategies, Depression, Anxiety, Grief and Loss, Other  [Pt admits her  financial struggles make things challenging and difficult- copay for counseling is more than she feels she can afford. she denies SI- beginning Wellbutrin dose Dr Sedalia Muta added]       Follow up plan: Follow up call scheduled for 07/06/23    Encounter Outcome:  Pt. Visit Completed

## 2023-06-10 LAB — LIPID PANEL
Chol/HDL Ratio: 1.8 ratio (ref 0.0–4.4)
Cholesterol, Total: 122 mg/dL (ref 100–199)
HDL: 69 mg/dL (ref >39)
LDL Chol Calc (NIH): 42 mg/dL (ref 0–99)
Triglycerides: 46 mg/dL (ref 0–149)
VLDL Cholesterol Cal: 11 mg/dL (ref 5–40)

## 2023-06-10 LAB — TSH: TSH: 0.749 u[IU]/mL (ref 0.450–4.500)

## 2023-06-10 LAB — COMPREHENSIVE METABOLIC PANEL
ALT: 10 IU/L (ref 0–32)
AST: 15 IU/L (ref 0–40)
Albumin: 4.1 g/dL (ref 3.8–4.8)
Alkaline Phosphatase: 122 IU/L — ABNORMAL HIGH (ref 44–121)
BUN/Creatinine Ratio: 20 (ref 12–28)
BUN: 18 mg/dL (ref 8–27)
Bilirubin Total: 0.5 mg/dL (ref 0.0–1.2)
CO2: 25 mmol/L (ref 20–29)
Calcium: 9 mg/dL (ref 8.7–10.3)
Chloride: 107 mmol/L — ABNORMAL HIGH (ref 96–106)
Creatinine, Ser: 0.89 mg/dL (ref 0.57–1.00)
Globulin, Total: 2 g/dL (ref 1.5–4.5)
Glucose: 82 mg/dL (ref 70–99)
Potassium: 4.9 mmol/L (ref 3.5–5.2)
Sodium: 144 mmol/L (ref 134–144)
Total Protein: 6.1 g/dL (ref 6.0–8.5)
eGFR: 69 mL/min/{1.73_m2} (ref >59)

## 2023-06-10 LAB — CBC WITH DIFFERENTIAL/PLATELET
Basophils Absolute: 0 10*3/uL (ref 0.0–0.2)
Basos: 1 %
EOS (ABSOLUTE): 0.1 10*3/uL (ref 0.0–0.4)
Eos: 2 %
Hematocrit: 40.2 % (ref 34.0–46.6)
Hemoglobin: 12.7 g/dL (ref 11.1–15.9)
Immature Grans (Abs): 0 10*3/uL (ref 0.0–0.1)
Immature Granulocytes: 0 %
Lymphocytes Absolute: 1.5 10*3/uL (ref 0.7–3.1)
Lymphs: 33 %
MCH: 27.8 pg (ref 26.6–33.0)
MCHC: 31.6 g/dL (ref 31.5–35.7)
MCV: 88 fL (ref 79–97)
Monocytes Absolute: 0.5 10*3/uL (ref 0.1–0.9)
Monocytes: 11 %
Neutrophils Absolute: 2.4 10*3/uL (ref 1.4–7.0)
Neutrophils: 53 %
Platelets: 212 10*3/uL (ref 150–450)
RBC: 4.57 x10E6/uL (ref 3.77–5.28)
RDW: 14.1 % (ref 11.7–15.4)
WBC: 4.5 10*3/uL (ref 3.4–10.8)

## 2023-06-10 LAB — URINE CULTURE

## 2023-06-10 NOTE — Assessment & Plan Note (Signed)
Continue with Prolia 60 mg IM every 6 months

## 2023-06-19 DIAGNOSIS — J432 Centrilobular emphysema: Secondary | ICD-10-CM

## 2023-06-19 DIAGNOSIS — F418 Other specified anxiety disorders: Secondary | ICD-10-CM

## 2023-06-19 DIAGNOSIS — I10 Essential (primary) hypertension: Secondary | ICD-10-CM

## 2023-06-19 DIAGNOSIS — F324 Major depressive disorder, single episode, in partial remission: Secondary | ICD-10-CM

## 2023-06-27 DIAGNOSIS — M47816 Spondylosis without myelopathy or radiculopathy, lumbar region: Secondary | ICD-10-CM | POA: Diagnosis not present

## 2023-06-28 ENCOUNTER — Other Ambulatory Visit: Payer: Self-pay | Admitting: Family Medicine

## 2023-06-28 DIAGNOSIS — F064 Anxiety disorder due to known physiological condition: Secondary | ICD-10-CM

## 2023-07-01 ENCOUNTER — Other Ambulatory Visit: Payer: Self-pay | Admitting: Family Medicine

## 2023-07-01 DIAGNOSIS — C3491 Malignant neoplasm of unspecified part of right bronchus or lung: Secondary | ICD-10-CM

## 2023-07-01 DIAGNOSIS — E890 Postprocedural hypothyroidism: Secondary | ICD-10-CM

## 2023-07-05 ENCOUNTER — Other Ambulatory Visit: Payer: Self-pay

## 2023-07-05 DIAGNOSIS — G8929 Other chronic pain: Secondary | ICD-10-CM

## 2023-07-05 MED ORDER — OXYCODONE HCL 10 MG PO TABS
10.0000 mg | ORAL_TABLET | Freq: Three times a day (TID) | ORAL | 0 refills | Status: DC | PRN
Start: 2023-07-05 — End: 2023-08-03

## 2023-07-06 ENCOUNTER — Ambulatory Visit: Payer: Self-pay | Admitting: *Deleted

## 2023-07-06 ENCOUNTER — Encounter: Payer: Medicare HMO | Admitting: *Deleted

## 2023-07-06 NOTE — Patient Outreach (Signed)
  Care Coordination   Follow Up Visit Note   07/06/2023 Name: Victoria Pena MRN: [id number] DOB: 05/25/1951  Victoria Pena is a 72 y.o. year old female who sees Cox, Fritzi Mandes, MD for primary care. I spoke with  Marko Stai Curbow by phone today.  What matters to the patients health and wellness today?  Back injections wore off- going to see Dr Yetta Barre tomorrow to discuss plans further.    Goals Addressed               This Visit's Progress     Patient Stated she feels overwhelmed sometimes and has depression issues. She has challenges with meeting daily care needs (pt-stated)        Activities and task to complete in order to accomplish goals.   Glad to hear all the progress and positive news today Continue taking the new dose of RX offered by PCP Review your new [employer] Dual plan benefits- great that it will provide you healthy foods/card as well as dental, vision and more! Pain Clinic visit 07/21/23 Keep journaling, sewing, reading, walking (incorporate daughter joining) and other good self care activities Continue with compliance of taking medication prescribed by Doctor- alert them if stomach issues continue Continue wearing your stimulator for back- glad you are feeling some relief and plans for follow up 06/07/23 with Neurosurgeon  Continue with your activities including sewing, quilting, gardening, reading, daily walking,etc Consider seeking opportunities to connect with others through church, [employer], [employer], etc Continue with journaling and/or the workbook mentioned for self growth/forgiveness Reach out to friends/neighbors for connecting and doing things together- save on gas money by carpooling  Consider [facility] transport  Review the [employer] calendar and consider the classes- sewing?          SDOH assessments and interventions completed:  Yes     Care Coordination Interventions:  Yes, provided  Interventions Today    Flowsheet Row  Most Recent Value  Chronic Disease   Chronic disease during today's visit Other  [depression]  General Interventions   General Interventions Discussed/Reviewed [facility], Doctor Visits  [Pt reports she goes back to see back MD tomorrow- disappointed the shots did not seem to work- thinks she needs an MRI. Also plans to see Pain Clinic Provider 07/21/23.]  Education Interventions   Education Provided Provided Education  Provided Verbal Education On Mental Health/Coping with Illness, Medication, Community Resources  [CSW discussed new dose of Wellbutrin started about 1 month ago-]  Mental Health Interventions   Mental Health Discussed/Reviewed Mental Health Discussed, Mental Health Reviewed, Coping Strategies, Depression, Other, Grief and Loss  [Pt somewhat anxious about her ex-husband coming to stay with her for about 10 days while son is away- her daughter will come and also help with his care needs.]  Pharmacy Interventions   Pharmacy Dicussed/Reviewed Pharmacy Topics Discussed, Medication Adherence  [Pt has started an increased dose of Wellbutrin about 1 month ago- hoping for improvement with symptoms related to depression.]       Follow up plan: Follow up call scheduled for 08/02/23    Encounter Outcome:  Pt. Visit Completed

## 2023-07-06 NOTE — Patient Instructions (Signed)
Visit Information  Thank you for taking time to visit with me today. Please don't hesitate to contact me if I can be of assistance to you.   Following are the goals we discussed today:   Goals Addressed               This Visit's Progress     Patient Stated she feels overwhelmed sometimes and has depression issues. She has challenges with meeting daily care needs (pt-stated)        Activities and task to complete in order to accomplish goals.   Glad to hear all the progress and positive news today Continue taking the new dose of RX offered by PCP Review your new [employer] Dual plan benefits- great that it will provide you healthy foods/card as well as dental, vision and more! Pain Clinic visit 07/21/23 Keep journaling, sewing, reading, walking (incorporate daughter joining) and other good self care activities Continue with compliance of taking medication prescribed by Doctor- alert them if stomach issues continue Continue wearing your stimulator for back- glad you are feeling some relief and plans for follow up 06/07/23 with Neurosurgeon  Continue with your activities including sewing, quilting, gardening, reading, daily walking,etc Consider seeking opportunities to connect with others through church, [employer], [facility], etc Continue with journaling and/or the workbook mentioned for self growth/forgiveness Reach out to friends/neighbors for connecting and doing things together- save on gas money by carpooling  Consider [facility] transport  Review the [facility] calendar and consider the classes- sewing?          Our next appointment is by telephone on 08/02/23  Please call the care guide team at [phone] if you need to cancel or reschedule your appointment.   If you are experiencing a Mental Health or Behavioral Health Crisis or need someone to talk to, please call the Suicide and Crisis Lifeline: 988 call 911   The patient verbalized understanding of  instructions, educational materials, and care plan provided today and DECLINED offer to receive copy of patient instructions, educational materials, and care plan.   Telephone follow up appointment with care management team member scheduled for:08/02/23  Reece Levy, MSW, LCSW Clinical Social Worker [facility] [phone]

## 2023-07-07 ENCOUNTER — Other Ambulatory Visit: Payer: Self-pay

## 2023-07-07 DIAGNOSIS — K219 Gastro-esophageal reflux disease without esophagitis: Secondary | ICD-10-CM

## 2023-07-07 DIAGNOSIS — Z6830 Body mass index (BMI) 30.0-30.9, adult: Secondary | ICD-10-CM | POA: Diagnosis not present

## 2023-07-07 DIAGNOSIS — M4317 Spondylolisthesis, lumbosacral region: Secondary | ICD-10-CM | POA: Diagnosis not present

## 2023-07-07 MED ORDER — PANTOPRAZOLE SODIUM 40 MG PO TBEC: 40.0000 mg | DELAYED_RELEASE_TABLET | Freq: Every day | ORAL | 2 refills | Status: DC

## 2023-07-11 DIAGNOSIS — H25813 Combined forms of age-related cataract, bilateral: Secondary | ICD-10-CM | POA: Diagnosis not present

## 2023-07-11 DIAGNOSIS — H2513 Age-related nuclear cataract, bilateral: Secondary | ICD-10-CM | POA: Diagnosis not present

## 2023-07-12 ENCOUNTER — Ambulatory Visit (INDEPENDENT_AMBULATORY_CARE_PROVIDER_SITE_OTHER): Payer: Medicare HMO

## 2023-07-12 ENCOUNTER — Telehealth: Payer: Medicare HMO

## 2023-07-12 DIAGNOSIS — I10 Essential (primary) hypertension: Secondary | ICD-10-CM

## 2023-07-12 DIAGNOSIS — F419 Anxiety disorder, unspecified: Secondary | ICD-10-CM

## 2023-07-12 DIAGNOSIS — F324 Major depressive disorder, single episode, in partial remission: Secondary | ICD-10-CM

## 2023-07-12 DIAGNOSIS — J432 Centrilobular emphysema: Secondary | ICD-10-CM

## 2023-07-12 DIAGNOSIS — F418 Other specified anxiety disorders: Secondary | ICD-10-CM

## 2023-07-12 NOTE — Chronic Care Management (AMB) (Signed)
Chronic Care Management   CCM RN Visit Note  07/12/2023 Name: Victoria Pena MRN: [id number] DOB: September 11, 1951  Subjective: Victoria Pena is a 72 y.o. year old female who is a primary care patient of Cox, Kirsten, MD. The patient was referred to the Chronic Care Management team for assistance with care management needs subsequent to provider initiation of CCM services and plan of care.    Today's Visit:  Engaged with patient by telephone for follow up visit.     SDOH Interventions Today    Flowsheet Row Most Recent Value  SDOH Interventions   Health Literacy Interventions Intervention Not Indicated         Goals Addressed             This Visit's Progress    CCM Expected Outcome:  Monitor, Self-Manage and Reduce Symptoms of: Depression, Anxiety, Stress       Current Barriers:  Care Coordination needs related to resources in the community for help with mental health needs and community resources for financial support in a patient with anxiety, depression, and stress Chronic Disease Management support and education needs related to effective management of stress, anxiety, and depression  Planned Interventions: Evaluation of current treatment plan related to stress, anxiety, and depression and patient's adherence to plan as established by provider.  Is working with the LCSW and states that has been helpful. She has made a bucket list and she also [facility] She says that she is interested in the socialization and events there. The patient is doing better as far as her depression. Saw the pcp in June and had an increase in her medications. This is working well for her. Currently she is taking care of her ex-husband in her home at the request of her son. Her daughter is there also. She states that it is working out well and he will be there until Monday. The patient states that she was unable to get refunded for the damage the ants caused her air  conditioning system but she is going to talk to them because she is afraid it is going to happen again and the other day when she was outside she was bit by several ants on her foot. She is watching this closely. She is sewing and really enjoying this. Reflective listening and support given. Advised patient to call the office for changes in stress, anxiety, and depression Provided education to patient re: working with the CCM team, support available with resources, ongoing outreaches for effective management of stress, anxiety, and depression Reviewed medications with patient and discussed compliance. The patient is compliant with her medications. Denies any medication needs at this time. Works with the pharm D on effective medication management. Reviewed scheduled/upcoming provider appointments including 09-15-2023 at 1140 am Care Guide referral for resources in the community to help with food resources and financial constraints. Has been provided with information. The patient received information from the care guides but she states that none of those programs can actually help her. Will continue to monitor for changes and new programs that may be a resource for the patient.  Social Work referral for ongoing support and education related to effective management of depression, anxiety, stress. The patient has talked with the LCSW. She knows she needs to take things slow. She knows she cannot return to the work she previously did. The patient knows the care team is available to assist with needs. Continues to work with the SW  on a regular basis. After collaboration with the LCSW, the LCSW will reach out to the patient tomorrow for follow up and assistance.  Pharmacy referral for ongoing support and education of medication needs and management. Ongoing support and education from the pharm D. Discussed plans with patient for ongoing care management follow up and provided patient with direct contact information  for care management team Advised patient to discuss changes in mood, anxiety, depression, or mental health needs with provider Screening for signs and symptoms of depression related to chronic disease state  Assessed social determinant of health barriers Discussed self care and doing hobbies that make the patient happy or going on events that do not cost money, or doing things with family and friends. Talked to the patient about [employer] as this is an online bible study group that may help also with Bible study resources.   The patient knows to call the [facility] in between outreaches for new needs or concerns.   Symptom Management: Take medications as prescribed   Attend all scheduled provider appointments Call provider office for new concerns or questions  call the Suicide and Crisis Lifeline: 988 call the [place] National Suicide Prevention Lifeline: (412)453-2886 or TTY: 778-296-0030 TTY 848-281-1604) to talk to a trained counselor call 1-800-273-TALK (toll free, 24 hour hotline) if experiencing a Mental Health or Behavioral Health Crisis   Follow Up Plan: Telephone follow up appointment with care management team member scheduled for: 08-25-2023 at 345 pm        CCM Expected Outcome:  Monitor, Self-Manage, and Reduce Symptoms of Hypertension       Current Barriers:  Chronic Disease Management support and education needs related to effective management of HTN BP Readings from Last 3 Encounters:  06/08/23 100/70  03/03/23 106/68  01/22/23 125/76    Planned Interventions: Evaluation of current treatment plan related to hypertension self management and patient's adherence to plan as established by provider. Her blood pressures are stable and she denies any acute changes in her HTN or heart health. Will continue to monitor.    Provided education to patient re: stroke prevention, s/s of heart attack and stroke; Reviewed prescribed diet heart healthy diet. The patient is compliant with a heart  healthy diet.  Reviewed medications with patient and discussed importance of compliance. The patient is compliant with medications. Works with the pharm D on an ongoing basis;  Discussed plans with patient for ongoing care management follow up and provided patient with direct contact information for care management team; Advised patient, providing education and rationale, to monitor blood pressure daily and record, calling PCP for findings outside established parameters;  Reviewed scheduled/upcoming provider appointments including: 09-15-2023 at 1140 am Advised patient to discuss changes in HTN or heart health with provider; Provided education on prescribed diet heart healthy ;  Discussed complications of poorly controlled blood pressure such as heart disease, stroke, circulatory complications, vision complications, kidney impairment, sexual dysfunction;  Screening for signs and symptoms of depression related to chronic disease state;  Assessed social determinant of health barriers;   Symptom Management: Take medications as prescribed   Attend all scheduled provider appointments Call provider office for new concerns or questions  call the Suicide and Crisis Lifeline: 988 call the [place] National Suicide Prevention Lifeline: (561) 767-0751 or TTY: 307 565 4398 TTY 614-695-1868) to talk to a trained counselor call 1-800-273-TALK (toll free, 24 hour hotline) if experiencing a Mental Health or Behavioral Health Crisis  check blood pressure weekly learn about high blood pressure call doctor  for signs and symptoms of high blood pressure develop an action plan for high blood pressure keep all doctor appointments take medications for blood pressure exactly as prescribed report new symptoms to your doctor  Follow Up Plan: Telephone follow up appointment with care management team member scheduled for: 08-25-2023 at 345 pm       CCM:  Maintain, Monitor and Self-Manage Symptoms of COPD       Current  Barriers:  Chronic Disease Management support and education needs related to effective COPD  Planned Interventions: Provided patient with basic written and verbal COPD education on self care/management/and exacerbation prevention. The patient states that she is staying out of the heat and being careful. Denies any acute exacerbations of her COPD.  Advised patient to track and manage COPD triggers. Is aware of things that exacerbate her condition. Provided written and verbal instructions on pursed lip breathing and utilized returned demonstration as teach back Provided instruction about proper use of medications used for management of COPD including inhalers Advised patient to self assesses COPD action plan zone and make appointment with provider if in the yellow zone for 48 hours without improvement Advised patient to engage in light exercise as tolerated 3-5 days a week to aid in the the management of COPD. The patient is doing some things outside and wants to be more active. She deals with chronic pain and will be seeing the neurosurgeon again after her MRI for results and recommendations for her back pain and discomfort. The patient is having periods of numbness in her legs and has not had this before.  Provided education about and advised patient to utilize infection prevention strategies to reduce risk of respiratory infection. The patient is mindful of her surroundings and is safe. The patient knows what worse looks like and to call for changes or needs.  Discussed the importance of adequate rest and management of fatigue with COPD Screening for signs and symptoms of depression related to chronic disease state  Assessed social determinant of health barriers  Symptom Management: Take medications as prescribed   Attend all scheduled provider appointments Call provider office for new concerns or questions  call the Suicide and Crisis Lifeline: 988 call the [place] National Suicide Prevention  Lifeline: (704) 883-4808 or TTY: 579-714-8687 TTY (724)797-1986) to talk to a trained counselor call 1-800-273-TALK (toll free, 24 hour hotline) if experiencing a Mental Health or Behavioral Health Crisis  identify and remove indoor air pollutants limit outdoor activity during cold weather listen for public air quality announcements every day develop a rescue plan eliminate symptom triggers at home follow rescue plan if symptoms flare-up  Follow Up Plan: Telephone follow up appointment with care management team member scheduled for: 08-25-2023 and 345 pm          Plan:Telephone follow up appointment with care management team member scheduled for:  08-25-2023 at 345 pm  Victoria Denver RN, MSN, CCM RN Care Manager  Chronic Care Management Direct Number: [phone]

## 2023-07-12 NOTE — Patient Instructions (Signed)
Please call the care guide team at [phone] if you need to cancel or reschedule your appointment.   If you are experiencing a Mental Health or Behavioral Health Crisis or need someone to talk to, please call the Suicide and Crisis Lifeline: 988 call the [place] National Suicide Prevention Lifeline: 650-405-1811 or TTY: (513)309-7781 TTY 814-675-1759) to talk to a trained counselor call 1-800-273-TALK (toll free, 24 hour hotline) go to [facility] [address], [place] [phone])   Following is a copy of the CCM Program Consent:  CCM service includes personalized support from designated clinical staff supervised by the physician, including individualized plan of care and coordination with other care providers 24/7 contact phone numbers for assistance for urgent and routine care needs. Service will only be billed when office clinical staff spend 20 minutes or more in a month to coordinate care. Only one practitioner may furnish and bill the service in a calendar month. The patient may stop CCM services at amy time (effective at the end of the month) by phone call to the office staff. The patient will be responsible for cost sharing (co-pay) or up to 20% of the service fee (after annual deductible is met)  Following is a copy of your full provider care plan:   Goals Addressed             This Visit's Progress    CCM Expected Outcome:  Monitor, Self-Manage and Reduce Symptoms of: Depression, Anxiety, Stress       Current Barriers:  Care Coordination needs related to resources in the community for help with mental health needs and community resources for financial support in a patient with anxiety, depression, and stress Chronic Disease Management support and education needs related to effective management of stress, anxiety, and depression  Planned Interventions: Evaluation of current treatment plan related to stress, anxiety, and depression  and patient's adherence to plan as established by provider.  Is working with the LCSW and states that has been helpful. She has made a bucket list and she also [facility] She says that she is interested in the socialization and events there. The patient is doing better as far as her depression. Saw the pcp in June and had an increase in her medications. This is working well for her. Currently she is taking care of her ex-husband in her home at the request of her son. Her daughter is there also. She states that it is working out well and he will be there until Monday. The patient states that she was unable to get refunded for the damage the ants caused her air conditioning system but she is going to talk to them because she is afraid it is going to happen again and the other day when she was outside she was bit by several ants on her foot. She is watching this closely. She is sewing and really enjoying this. Reflective listening and support given. Advised patient to call the office for changes in stress, anxiety, and depression Provided education to patient re: working with the CCM team, support available with resources, ongoing outreaches for effective management of stress, anxiety, and depression Reviewed medications with patient and discussed compliance. The patient is compliant with her medications. Denies any medication needs at this time. Works with the pharm D on effective medication management. Reviewed scheduled/upcoming provider appointments including 09-15-2023 at 1140 am Care Guide referral for resources in the community to help with food resources and  financial constraints. Has been provided with information. The patient received information from the care guides but she states that none of those programs can actually help her. Will continue to monitor for changes and new programs that may be a resource for the patient.  Social Work referral for ongoing support and  education related to effective management of depression, anxiety, stress. The patient has talked with the LCSW. She knows she needs to take things slow. She knows she cannot return to the work she previously did. The patient knows the care team is available to assist with needs. Continues to work with the SW on a regular basis. After collaboration with the LCSW, the LCSW will reach out to the patient tomorrow for follow up and assistance.  Pharmacy referral for ongoing support and education of medication needs and management. Ongoing support and education from the pharm D. Discussed plans with patient for ongoing care management follow up and provided patient with direct contact information for care management team Advised patient to discuss changes in mood, anxiety, depression, or mental health needs with provider Screening for signs and symptoms of depression related to chronic disease state  Assessed social determinant of health barriers Discussed self care and doing hobbies that make the patient happy or going on events that do not cost money, or doing things with family and friends. Talked to the patient about [employer] as this is an online bible study group that may help also with Bible study resources.   The patient knows to call the [facility] in between outreaches for new needs or concerns.   Symptom Management: Take medications as prescribed   Attend all scheduled provider appointments Call provider office for new concerns or questions  call the Suicide and Crisis Lifeline: 988 call the [place] National Suicide Prevention Lifeline: 805-526-3614 or TTY: (956)021-1342 TTY 715-001-8853) to talk to a trained counselor call 1-800-273-TALK (toll free, 24 hour hotline) if experiencing a Mental Health or Behavioral Health Crisis   Follow Up Plan: Telephone follow up appointment with care management team member scheduled for: 08-25-2023 at 345 pm        CCM Expected Outcome:  Monitor, Self-Manage, and  Reduce Symptoms of Hypertension       Current Barriers:  Chronic Disease Management support and education needs related to effective management of HTN BP Readings from Last 3 Encounters:  06/08/23 100/70  03/03/23 106/68  01/22/23 125/76    Planned Interventions: Evaluation of current treatment plan related to hypertension self management and patient's adherence to plan as established by provider. Her blood pressures are stable and she denies any acute changes in her HTN or heart health. Will continue to monitor.    Provided education to patient re: stroke prevention, s/s of heart attack and stroke; Reviewed prescribed diet heart healthy diet. The patient is compliant with a heart healthy diet.  Reviewed medications with patient and discussed importance of compliance. The patient is compliant with medications. Works with the pharm D on an ongoing basis;  Discussed plans with patient for ongoing care management follow up and provided patient with direct contact information for care management team; Advised patient, providing education and rationale, to monitor blood pressure daily and record, calling PCP for findings outside established parameters;  Reviewed scheduled/upcoming provider appointments including: 09-15-2023 at 1140 am Advised patient to discuss changes in HTN or heart health with provider; Provided education on prescribed diet heart healthy ;  Discussed complications of poorly controlled blood pressure such as heart disease, stroke, circulatory  complications, vision complications, kidney impairment, sexual dysfunction;  Screening for signs and symptoms of depression related to chronic disease state;  Assessed social determinant of health barriers;   Symptom Management: Take medications as prescribed   Attend all scheduled provider appointments Call provider office for new concerns or questions  call the Suicide and Crisis Lifeline: 988 call the [place] National Suicide Prevention  Lifeline: 570-144-7922 or TTY: (250)214-8567 TTY 620-597-6589) to talk to a trained counselor call 1-800-273-TALK (toll free, 24 hour hotline) if experiencing a Mental Health or Behavioral Health Crisis  check blood pressure weekly learn about high blood pressure call doctor for signs and symptoms of high blood pressure develop an action plan for high blood pressure keep all doctor appointments take medications for blood pressure exactly as prescribed report new symptoms to your doctor  Follow Up Plan: Telephone follow up appointment with care management team member scheduled for: 08-25-2023 at 345 pm       CCM:  Maintain, Monitor and Self-Manage Symptoms of COPD       Current Barriers:  Chronic Disease Management support and education needs related to effective COPD  Planned Interventions: Provided patient with basic written and verbal COPD education on self care/management/and exacerbation prevention. The patient states that she is staying out of the heat and being careful. Denies any acute exacerbations of her COPD.  Advised patient to track and manage COPD triggers. Is aware of things that exacerbate her condition. Provided written and verbal instructions on pursed lip breathing and utilized returned demonstration as teach back Provided instruction about proper use of medications used for management of COPD including inhalers Advised patient to self assesses COPD action plan zone and make appointment with provider if in the yellow zone for 48 hours without improvement Advised patient to engage in light exercise as tolerated 3-5 days a week to aid in the the management of COPD. The patient is doing some things outside and wants to be more active. She deals with chronic pain and will be seeing the neurosurgeon again after her MRI for results and recommendations for her back pain and discomfort. The patient is having periods of numbness in her legs and has not had this before.  Provided  education about and advised patient to utilize infection prevention strategies to reduce risk of respiratory infection. The patient is mindful of her surroundings and is safe. The patient knows what worse looks like and to call for changes or needs.  Discussed the importance of adequate rest and management of fatigue with COPD Screening for signs and symptoms of depression related to chronic disease state  Assessed social determinant of health barriers  Symptom Management: Take medications as prescribed   Attend all scheduled provider appointments Call provider office for new concerns or questions  call the Suicide and Crisis Lifeline: 988 call the [place] National Suicide Prevention Lifeline: 804-374-9160 or TTY: (406)773-3791 TTY 959-396-6898) to talk to a trained counselor call 1-800-273-TALK (toll free, 24 hour hotline) if experiencing a Mental Health or Behavioral Health Crisis  identify and remove indoor air pollutants limit outdoor activity during cold weather listen for public air quality announcements every day develop a rescue plan eliminate symptom triggers at home follow rescue plan if symptoms flare-up  Follow Up Plan: Telephone follow up appointment with care management team member scheduled for: 08-25-2023 and 345 pm          Patient verbalizes understanding of instructions and care plan provided today and agrees to view in MyChart. Active MyChart  status and patient understanding of how to access instructions and care plan via MyChart confirmed with patient.  Telephone follow up appointment with care management team member scheduled for: 08-25-2023 at 345 pm

## 2023-07-13 DIAGNOSIS — H532 Diplopia: Secondary | ICD-10-CM | POA: Diagnosis not present

## 2023-07-15 ENCOUNTER — Other Ambulatory Visit: Payer: Self-pay

## 2023-07-15 NOTE — Telephone Encounter (Signed)
Left voicemail for patient to call office, Forteo was delivered to office but don't see this medication on patient medication list, I do see that she is doing prolia. Patient may need to call patient assistant place and cancel patient assistant is she is no longer needing this medication.

## 2023-07-18 DIAGNOSIS — M4807 Spinal stenosis, lumbosacral region: Secondary | ICD-10-CM | POA: Diagnosis not present

## 2023-07-18 DIAGNOSIS — M4317 Spondylolisthesis, lumbosacral region: Secondary | ICD-10-CM | POA: Diagnosis not present

## 2023-07-18 DIAGNOSIS — M4805 Spinal stenosis, thoracolumbar region: Secondary | ICD-10-CM | POA: Diagnosis not present

## 2023-07-18 DIAGNOSIS — M48061 Spinal stenosis, lumbar region without neurogenic claudication: Secondary | ICD-10-CM | POA: Diagnosis not present

## 2023-07-18 DIAGNOSIS — M5135 Other intervertebral disc degeneration, thoracolumbar region: Secondary | ICD-10-CM | POA: Diagnosis not present

## 2023-07-20 DIAGNOSIS — J432 Centrilobular emphysema: Secondary | ICD-10-CM

## 2023-07-20 DIAGNOSIS — I10 Essential (primary) hypertension: Secondary | ICD-10-CM

## 2023-07-20 DIAGNOSIS — F418 Other specified anxiety disorders: Secondary | ICD-10-CM

## 2023-07-20 DIAGNOSIS — F324 Major depressive disorder, single episode, in partial remission: Secondary | ICD-10-CM

## 2023-07-21 DIAGNOSIS — H524 Presbyopia: Secondary | ICD-10-CM | POA: Diagnosis not present

## 2023-08-01 ENCOUNTER — Other Ambulatory Visit: Payer: Self-pay

## 2023-08-01 MED ORDER — ATORVASTATIN CALCIUM 40 MG PO TABS: 40.0000 mg | ORAL_TABLET | Freq: Every day | ORAL | 0 refills | Status: DC

## 2023-08-02 ENCOUNTER — Ambulatory Visit: Payer: Self-pay | Admitting: *Deleted

## 2023-08-02 NOTE — Patient Outreach (Signed)
  Care Coordination   Follow Up Visit Note   08/02/2023 Name: Victoria Pena MRN: [id number] DOB: 05/30/1951  Victoria Pena is a 72 y.o. year old female who sees Cox, Fritzi Mandes, MD for primary care. I spoke with  Victoria Pena by phone today.  What matters to the patients health and wellness today?  Taking care of grandson today- eager to get MRI results    Goals Addressed             This Visit's Progress    Depression minimized       Activities and task to complete in order to accomplish goals.   Continue working on the quilt you are making! Continue Wellbutrin dose prescribed by Dr Sedalia Muta Continue to do things that you enjoy- sewing, etc Continue to manage your budget/finances and seek ways to cut cost where you possibly can Consider journaling Inquire with your DSS/Medicaid office about possible eligiblity for full Medicaid (as opposed to current partial Medicaid that only pays your premium)  `          SDOH assessments and interventions completed:  Yes     Care Coordination Interventions:  Yes, provided  Interventions Today    Flowsheet Row Most Recent Value  Chronic Disease   Chronic disease during today's visit Other  [depression/anxiety]  Exercise Interventions   Exercise Discussed/Reviewed Exercise Discussed, Physical Activity  Physical Activity Discussed/Reviewed Physical Activity Discussed, Types of exercise, Gym  [facility]]  Mental Health Interventions   Mental Health Discussed/Reviewed Mental Health Discussed, Mental Health Reviewed, Coping Strategies, Depression, Grief and Loss  [Pushing myself- have good and bad days- coping with close neighbor with cancer that is spreading]  Pharmacy Interventions   Pharmacy Dicussed/Reviewed Pharmacy Topics Discussed, Pharmacy Topics Reviewed  [continues to take the Wellbutrin-AM, Lexapro- 6PM.  Pt thinks the insomnia due to Wellbutrin?]       Follow up plan:  Follow up call scheduled for 08/15/23    Encounter Outcome:  Pt. Visit Completed

## 2023-08-02 NOTE — Patient Instructions (Signed)
Visit Information  Thank you for taking time to visit with me today. Please don't hesitate to contact me if I can be of assistance to you.   Following are the goals we discussed today:   Goals Addressed             This Visit's Progress    Depression minimized       Activities and task to complete in order to accomplish goals.   Continue working on the quilt you are making! Continue Wellbutrin dose prescribed by Dr Sedalia Muta Continue to do things that you enjoy- sewing, etc Continue to manage your budget/finances and seek ways to cut cost where you possibly can Consider journaling Inquire with your DSS/Medicaid office about possible eligiblity for full Medicaid (as opposed to current partial Medicaid that only pays your premium)  `          Our next appointment is by telephone on 08/15/23    Please call the care guide team at [phone] if you need to cancel or reschedule your appointment.   If you are experiencing a Mental Health or Behavioral Health Crisis or need someone to talk to, please call the Suicide and Crisis Lifeline: 988 call 911   The patient verbalized understanding of instructions, educational materials, and care plan provided today and DECLINED offer to receive copy of patient instructions, educational materials, and care plan.   Telephone follow up appointment with care management team member scheduled for: 08/15/23 Reece Levy, MSW, LCSW Clinical Social Worker [facility] [phone]

## 2023-08-03 ENCOUNTER — Encounter: Payer: Self-pay | Admitting: Family Medicine

## 2023-08-03 ENCOUNTER — Other Ambulatory Visit: Payer: Self-pay

## 2023-08-03 DIAGNOSIS — G8929 Other chronic pain: Secondary | ICD-10-CM

## 2023-08-03 DIAGNOSIS — M810 Age-related osteoporosis without current pathological fracture: Secondary | ICD-10-CM | POA: Diagnosis not present

## 2023-08-03 LAB — HM DEXA SCAN

## 2023-08-03 MED ORDER — OXYCODONE HCL 10 MG PO TABS
10.0000 mg | ORAL_TABLET | Freq: Three times a day (TID) | ORAL | 0 refills | Status: DC | PRN
Start: 2023-08-03 — End: 2023-09-01

## 2023-08-09 ENCOUNTER — Other Ambulatory Visit: Payer: Self-pay | Admitting: Family Medicine

## 2023-08-09 DIAGNOSIS — M797 Fibromyalgia: Secondary | ICD-10-CM

## 2023-08-11 ENCOUNTER — Other Ambulatory Visit: Payer: Self-pay

## 2023-08-11 DIAGNOSIS — F418 Other specified anxiety disorders: Secondary | ICD-10-CM

## 2023-08-11 DIAGNOSIS — M4317 Spondylolisthesis, lumbosacral region: Secondary | ICD-10-CM | POA: Diagnosis not present

## 2023-08-11 DIAGNOSIS — Z6830 Body mass index (BMI) 30.0-30.9, adult: Secondary | ICD-10-CM | POA: Diagnosis not present

## 2023-08-11 DIAGNOSIS — M47816 Spondylosis without myelopathy or radiculopathy, lumbar region: Secondary | ICD-10-CM | POA: Diagnosis not present

## 2023-08-11 MED ORDER — ESCITALOPRAM OXALATE 10 MG PO TABS
10.0000 mg | ORAL_TABLET | Freq: Every day | ORAL | 1 refills | Status: DC
Start: 2023-08-11 — End: 2024-02-07

## 2023-08-15 ENCOUNTER — Ambulatory Visit: Payer: Self-pay | Admitting: *Deleted

## 2023-08-15 NOTE — Patient Outreach (Signed)
  Care Coordination   Follow Up Visit Note   08/15/2023 Name: Victoria Pena MRN: [id number] DOB: 02/25/1951  Victoria Pena is a 72 y.o. year old female who sees Victoria Pena, Victoria Mandes, MD for primary care. I spoke with  Victoria Pena by phone today.  What matters to the patients health and wellness today?  "Dr Victoria Pena said there's nothing he can for me/my back"    Goals Addressed               This Visit's Progress     Depression minimized        Activities and task to complete in order to accomplish goals.   Continue working on the quilt you are making! Continue Wellbutrin dose prescribed by Dr Victoria Pena Continue to do things that you enjoy- sewing, etc Continue to manage your budget/finances and seek ways to cut cost where you possibly can Continue to seek part time job as you shared Consider journaling Inquire with your DSS/Medicaid office about possible eligiblity for full Medicaid (as opposed to current partial Medicaid that only pays your premium) as well as compare options for open enrollment in October Continue with your positive self care- walking,etc Upcoming Pain Clinic visit  Attend gym.silver sneakers  `        Patient Stated she feels overwhelmed sometimes and has depression issues. She has challenges with meeting daily care needs (pt-stated)        Activities and task to complete in order to accomplish goals.   Review your new [employer] Dual plan benefits- great that it will provide you healthy foods/card as well as dental, vision and more! Pain Clinic visit 08/18/23 Keep journaling, sewing, reading, walking (incorporate daughter joining) and other good self care activities Continue with compliance of taking medication prescribed by Doctor- alert them if stomach issues continue  Continue with your activities including sewing, quilting, gardening, reading, daily walking,etc Consider seeking opportunities to connect with others through church, Research scientist (medical), [facility], etc Continue with journaling and/or the workbook mentioned for self growth/forgiveness Reach out to friends/neighbors for connecting and doing things together- save on gas money by carpooling  Consider [employer] transport  Review the [facility] calendar and consider the classes- sewing?          SDOH assessments and interventions completed:  Yes     Care Coordination Interventions:  Yes, provided  Interventions Today    Flowsheet Row Most Recent Value  Chronic Disease   Chronic disease during today's visit Other  [osteoporosis/back pain]  General Interventions   General Interventions Discussed/Reviewed Community Resources  Doctor Visits Discussed/Reviewed Doctor Visits Discussed, Specialist  [Eager to see new Pain Clinic Provider this week]  Exercise Interventions   Exercise Discussed/Reviewed Exercise Discussed  [continue walking- [facility]]  Mental Health Interventions   Mental Health Discussed/Reviewed Mental Health Discussed, Coping Strategies  [Pt is coping well- stress over financial strains and considering options for part time job]       Follow up plan: Follow up call scheduled for 09/05/23    Encounter Outcome:  Pt. Visit Completed

## 2023-08-15 NOTE — Patient Instructions (Signed)
Visit Information  Thank you for taking time to visit with me today. Please don't hesitate to contact me if I can be of assistance to you.   Following are the goals we discussed today:   Goals Addressed               This Visit's Progress     Depression minimized        Activities and task to complete in order to accomplish goals.   Continue working on the quilt you are making! Continue Wellbutrin dose prescribed by Dr Sedalia Muta Continue to do things that you enjoy- sewing, etc Continue to manage your budget/finances and seek ways to cut cost where you possibly can Continue to seek part time job as you shared Consider journaling Inquire with your DSS/Medicaid office about possible eligiblity for full Medicaid (as opposed to current partial Medicaid that only pays your premium) as well as compare options for open enrollment in October Continue with your positive self care- walking,etc Upcoming Pain Clinic visit  Attend gym.silver sneakers  `        Patient Stated she feels overwhelmed sometimes and has depression issues. She has challenges with meeting daily care needs (pt-stated)        Activities and task to complete in order to accomplish goals.   Review your new [employer] Dual plan benefits- great that it will provide you healthy foods/card as well as dental, vision and more! Pain Clinic visit 08/18/23 Keep journaling, sewing, reading, walking (incorporate daughter joining) and other good self care activities Continue with compliance of taking medication prescribed by Doctor- alert them if stomach issues continue  Continue with your activities including sewing, quilting, gardening, reading, daily walking,etc Consider seeking opportunities to connect with others through church, Metallurgist, [facility], etc Continue with journaling and/or the workbook mentioned for self growth/forgiveness Reach out to friends/neighbors for connecting and doing things together- save  on gas money by carpooling  Consider [employer] transport  Review the [facility] [facility] calendar and consider the classes- sewing?          Our next appointment is by telephone on 09/05/23  Please call the care guide team at [phone] if you need to cancel or reschedule your appointment.   If you are experiencing a Mental Health or Behavioral Health Crisis or need someone to talk to, please call the Suicide and Crisis Lifeline: 988 call 911   The patient verbalized understanding of instructions, educational materials, and care plan provided today and DECLINED offer to receive copy of patient instructions, educational materials, and care plan.   Telephone follow up appointment with care management team member scheduled for: 09/05/23  Reece Levy, MSW, LCSW Clinical Social Worker [phone]

## 2023-08-16 ENCOUNTER — Other Ambulatory Visit: Payer: Self-pay | Admitting: Family Medicine

## 2023-08-16 DIAGNOSIS — F064 Anxiety disorder due to known physiological condition: Secondary | ICD-10-CM

## 2023-08-16 NOTE — Progress Notes (Signed)
Subjective:  Patient ID: Victoria Pena, female    DOB: 02-Dec-1951  Age: 72 y.o. MRN: [id number]  Chief Complaint  Patient presents with   Osteoporosis    HPI  Patient presents to discuss DEXA results and tx options for osteoporosis. Prolia every 6 month. Calcium supplements. Needs evenity. T score 3.8. Patient saw her back surgeon last week. Needs two surgeries, but bones are too soft.  Back pain is the very bad.       08/17/2023    8:37 AM 06/08/2023    3:20 PM 03/01/2023    3:00 PM 12/30/2022    1:31 PM 12/29/2022    1:24 PM  Depression screen PHQ 2/9  Decreased Interest 2 3 1 2    Down, Depressed, Hopeless 2 3 0 0   PHQ - 2 Score 4 6 1 2    Altered sleeping 2 3 1 3    Tired, decreased energy 2 3 1 3    Change in appetite 2 3 0 0   Feeling bad or failure about yourself  0 0 0 1   Trouble concentrating 2 2 0 1   Moving slowly or fidgety/restless 0 0 0 0   Suicidal thoughts 0 0 0 0   PHQ-9 Score 12 17 3 10    Difficult doing work/chores Somewhat difficult Extremely dIfficult Somewhat difficult Very difficult Somewhat difficult        08/25/2023    4:00 PM  Fall Risk   Falls in the past year? 0  Number falls in past yr: 0  Injury with Fall? 0  Risk for fall due to : No Fall Risks  Follow up Falls evaluation completed;Education provided;Falls prevention discussed    Patient Care Team: Blane Ohara, MD as PCP - General (Family Medicine) Buck Mam, LCSW as Social Worker [place], Kelton Pillar, LCSW as [employer] Care Management (Licensed Clinical Social Worker) Marlowe Sax, RN as Case Manager (General Practice)   Review of Systems  Constitutional:  Negative for chills, fatigue and fever.  HENT:  Negative for congestion, ear pain, rhinorrhea and sore throat.   Respiratory:  Negative for cough and shortness of breath.   Cardiovascular:  Negative for chest pain.  Gastrointestinal:  Negative for abdominal pain, constipation, diarrhea, nausea and vomiting.   Genitourinary:  Negative for dysuria and urgency.  Musculoskeletal:  Positive for arthralgias. Negative for back pain and myalgias.  Neurological:  Negative for dizziness, weakness, light-headedness and headaches.  Psychiatric/Behavioral:  Negative for dysphoric mood. The patient is not nervous/anxious.     Current Outpatient Medications on File Prior to Visit  Medication Sig Dispense Refill   ALPRAZolam (XANAX) 0.5 MG tablet TAKE 1 TABLET(0.5 MG) BY MOUTH TWICE DAILY AS NEEDED FOR ANXIETY 60 tablet 2   atorvastatin (LIPITOR) 40 MG tablet Take 1 tablet (40 mg total) by mouth daily. 90 tablet 0   Calcium Carb-Cholecalciferol (CALCIUM 500 + D3) 500-5 MG-MCG TABS Take 3 tablets by mouth daily. (Patient taking differently: Take 1 tablet by mouth in the morning and at bedtime.) 100 tablet 5   calcium carbonate (TUMS - DOSED IN MG ELEMENTAL CALCIUM) 500 MG chewable tablet Chew 1 tablet by mouth 2 (two) times daily as needed for indigestion or heartburn.     EPINEPHrine 0.3 mg/0.3 mL IJ SOAJ injection Inject 0.3 mg into the muscle as needed for anaphylaxis. 1 each 1   escitalopram (LEXAPRO) 10 MG tablet Take 1 tablet (10 mg total) by mouth daily. 90 tablet 1  famotidine (PEPCID) 40 MG tablet Take 1 tablet (40 mg total) by mouth daily. 90 tablet 1   fluticasone (FLONASE) 50 MCG/ACT nasal spray Place 2 sprays into both nostrils daily. (Patient taking differently: Place 2 sprays into both nostrils at bedtime.) 16 g 6   levothyroxine (SYNTHROID) 125 MCG tablet TAKE 1 TABLET BY MOUTH DAILY BEFORE BREAKFAST 90 tablet 0   losartan (COZAAR) 50 MG tablet Take 1 tablet (50 mg total) by mouth daily. 90 tablet 1   metroNIDAZOLE (METROGEL) 0.75 % gel Apply 1 application topically 2 (two) times daily. (Patient taking differently: Apply 1 application  topically 2 (two) times daily as needed (rosacea).) 45 g 3   Multiple Vitamin (MULTIVITAMIN WITH MINERALS) TABS tablet Take 1 tablet by mouth in the morning and at  bedtime.     mupirocin ointment (BACTROBAN) 2 % Apply 1 Application topically 2 (two) times daily. 22 g 0   Oxycodone HCl 10 MG TABS Take 1 tablet (10 mg total) by mouth 3 (three) times daily as needed ((score 7 to 10)). 90 tablet 0   pantoprazole (PROTONIX) 40 MG tablet Take 1 tablet (40 mg total) by mouth daily. 90 tablet 2   pregabalin (LYRICA) 150 MG capsule TAKE 1 CAPSULE(150 MG) BY MOUTH TWICE DAILY 60 capsule 3   trolamine salicylate (ASPERCREME) 10 % cream Apply 1 Application topically as needed for muscle pain.     No current facility-administered medications on file prior to visit.   Past Medical History:  Diagnosis Date   Acquired spondylolisthesis 05/12/2015   Adenocarcinoma of right lung, stage 1 (HCC) 07/26/2016   Anxiety    Arthritis    Arthropathy of lumbar facet joint 04/11/2019   Formatting of this note might be different from the original. Added automatically from request for surgery [id number]   Atherosclerosis of aorta (HCC) 07/08/2021   Benign essential hypertension 12/27/2016   BMI 30.0-30.9,adult 12/23/2020   COPD (chronic obstructive pulmonary disease) (HCC)    pt denies   Current mild episode of major depressive disorder (HCC) 03/24/2020   Daytime sleepiness 08/03/2021   Depression    Fibromyalgia 03/24/2020   Gastroesophageal reflux disease without esophagitis 03/24/2020   Graves disease 12/27/2016   Hypertension    Osteoporosis 08/03/2021   Pericardial cyst 02/02/2021   Postoperative hypothyroidism 01/17/2015   Reactive airways dysfunction syndrome, unspecified asthma severity, uncomplicated (HCC) 09/06/2015   S/P lobectomy of lung 07/26/2015   Formatting of this note might be different from the original. Right upper   S/P lumbar spinal fusion 05/11/2018   Thyroid disease    Toe ulcer, left, limited to breakdown of skin (HCC) 12/23/2020   Past Surgical History:  Procedure Laterality Date   ANKLE SURGERY     bilateral   APPENDECTOMY     BREAST BIOPSY  Left 02/25/2020   BREAST BIOPSY Right 08/17/2018   BREAST EXCISIONAL BIOPSY Right    unsure when but marked with scar marker   CHOLECYSTECTOMY     EYE MUSCLE SURGERY Bilateral    4-5 years ago   LAMINECTOMY WITH POSTERIOR LATERAL ARTHRODESIS LEVEL 2 N/A 01/21/2023   Procedure: Laminectomy  - L1-L2 - L2-L3 with non-instrumented posterior lateral fusion;  Surgeon: Tia Alert, MD;  Location: [facility] OR;  Service: Neurosurgery;  Laterality: N/A;   LUMBAR FUSION  2019   L4, 5, 6   REPLACEMENT TOTAL KNEE Left    THYROIDECTOMY      Family History  Problem Relation Age of Onset   Cancer  Mother    Cancer Father    Cancer Brother    Autism Grandson    Breast cancer Neg Hx    Social History   Socioeconomic History   Marital status: Widowed    Spouse name: Not on file   Number of children: Not on file   Years of education: Not on file   Highest education level: GED or equivalent  Occupational History   Not on file  Tobacco Use   Smoking status: Former    Current packs/day: 0.00    Average packs/day: 1.5 packs/day for 35.0 years (52.5 ttl pk-yrs)    Types: Cigarettes    Start date: 07/26/1964    Quit date: 07/27/1999    Years since quitting: 24.0   Smokeless tobacco: Never  Vaping Use   Vaping status: Never Used  Substance and Sexual Activity   Alcohol use: No   Drug use: No   Sexual activity: Not Currently  Other Topics Concern   Not on file  Social History Narrative   Not on file   Social Determinants of Health   Financial Resource Strain: Medium Risk (06/02/2023)   Overall Financial Resource Strain (CARDIA)    Difficulty of Paying Living Expenses: Somewhat hard  Food Insecurity: Food Insecurity Present (06/02/2023)   Hunger Vital Sign    Worried About Running Out of Food in the Last Year: Sometimes true    Ran Out of Food in the Last Year: Sometimes true  Transportation Needs: No Transportation Needs (06/02/2023)   PRAPARE - Administrator, Civil Service  (Medical): No    Lack of Transportation (Non-Medical): No  Physical Activity: Insufficiently Active (06/02/2023)   Exercise Vital Sign    Days of Exercise per Week: 5 days    Minutes of Exercise per Session: 10 min  Stress: Stress Concern Present (06/02/2023)   [employer] of Occupational Health - Occupational Stress Questionnaire    Feeling of Stress : To some extent  Social Connections: Socially Isolated (06/02/2023)   Social Connection and Isolation Panel [NHANES]    Frequency of Communication with Friends and Family: More than three times a week    Frequency of Social Gatherings with Friends and Family: Never    Attends Religious Services: Never    Database administrator or Organizations: No    Attends Banker Meetings: Never    Marital Status: Widowed    Objective:  BP 130/70   Pulse 88   Temp (!) 97.2 F (36.2 C)   Ht 5\' 5"  (1.651 m)   Wt 184 lb (83.5 kg)   LMP  (LMP Unknown)   SpO2 98%   BMI 30.62 kg/m      08/17/2023    8:35 AM 06/08/2023    2:55 PM 03/03/2023    1:54 PM  BP/Weight  Systolic BP 130 100 106  Diastolic BP 70 70 68  Wt. (Lbs) 184 179 178  BMI 30.62 kg/m2 29.79 kg/m2 29.62 kg/m2    Physical Exam Vitals reviewed.  Constitutional:      Appearance: Normal appearance. She is normal weight.  Neck:     Vascular: No carotid bruit.  Cardiovascular:     Rate and Rhythm: Normal rate and regular rhythm.     Heart sounds: Normal heart sounds.  Pulmonary:     Effort: Pulmonary effort is normal. No respiratory distress.     Breath sounds: Normal breath sounds.  Abdominal:     General: Abdomen is flat. Bowel  sounds are normal.     Palpations: Abdomen is soft.     Tenderness: There is no abdominal tenderness.  Musculoskeletal:        General: Tenderness (BL knees, hands,) and deformity (heberden's and bouchards.) present.  Neurological:     Mental Status: She is alert and oriented to person, place, and time.  Psychiatric:        Mood  and Affect: Mood normal.        Behavior: Behavior normal.     Diabetic Foot Exam - Simple   No data filed      Lab Results  Component Value Date   WBC 5.2 08/17/2023   HGB 12.9 08/17/2023   HCT 41.7 08/17/2023   PLT 249 08/17/2023   GLUCOSE 65 (L) 08/17/2023   CHOL 122 06/09/2023   TRIG 46 06/09/2023   HDL 69 06/09/2023   LDLCALC 42 06/09/2023   ALT 11 08/17/2023   AST 13 08/17/2023   NA 142 08/17/2023   K 4.5 08/17/2023   CL 105 08/17/2023   CREATININE 0.83 08/17/2023   BUN 16 08/17/2023   CO2 24 08/17/2023   TSH 1.190 08/17/2023   INR 1.0 12/31/2022      Assessment & Plan:    Age-related osteoporosis without current pathological fracture Assessment & Plan: Will try to get forteo for patient in place of Prolia 60 mg IM every 6 months Recommend calcium citrate with D 1200-1500 mg daily.  Orders: -     Teriparatide (Recombinant); Inject 20 mcg into the skin daily.  Dispense: 2.4 mL; Refill: 11  Pain in other joint Assessment & Plan: Check labs.  Orders: -     Rheumatoid factor -     CYCLIC CITRUL PEPTIDE ANTIBODY, IGG/IGA -     Sedimentation rate -     C-reactive protein -     ANA w/Reflex -     VITAMIN D 25 Hydroxy (Vit-D Deficiency, Fractures) -     CBC with Differential/Platelet -     Comprehensive metabolic panel  Mild vitamin D deficiency Assessment & Plan: Vitamin D level will be checked today  Encourage to eat vitamin rich diets  Orders: -     VITAMIN D 25 Hydroxy (Vit-D Deficiency, Fractures)  Other fatigue Assessment & Plan: Check labs  Orders: -     Rheumatoid factor -     CYCLIC CITRUL PEPTIDE ANTIBODY, IGG/IGA -     Sedimentation rate -     C-reactive protein -     ANA w/Reflex -     VITAMIN D 25 Hydroxy (Vit-D Deficiency, Fractures) -     CBC with Differential/Platelet -     Comprehensive metabolic panel -     T4, free -     TSH  Other orders -     ENA+DNA/DS+Sjorgen's     Meds ordered this encounter  Medications    Teriparatide, Recombinant, (FORTEO) 600 MCG/2.4ML SOPN    Sig: Inject 20 mcg into the skin daily.    Dispense:  2.4 mL    Refill:  11    Orders Placed This Encounter  Procedures   Rheumatoid factor   CYCLIC CITRUL PEPTIDE ANTIBODY, IGG/IGA   Sedimentation rate   C-reactive protein   ANA w/Reflex   VITAMIN D 25 Hydroxy (Vit-D Deficiency, Fractures)   CBC with Differential/Platelet   Comprehensive metabolic panel   T4, free   TSH   ENA+DNA/DS+Sjorgen's     Follow-up: Return in about 3 months (around 11/17/2023)  for chronic follow up.   I,Marla I Leal-Borjas,acting as a scribe for Blane Ohara, MD.,have documented all relevant documentation on the behalf of Blane Ohara, MD,as directed by  Blane Ohara, MD while in the presence of Blane Ohara, MD.   An After Visit Summary was printed and given to the patient.  Blane Ohara, MD [facility] [phone]

## 2023-08-17 ENCOUNTER — Ambulatory Visit (INDEPENDENT_AMBULATORY_CARE_PROVIDER_SITE_OTHER): Payer: Medicare HMO | Admitting: Family Medicine

## 2023-08-17 VITALS — BP 130/70 | HR 88 | Temp 97.2°F | Ht 65.0 in | Wt 184.0 lb

## 2023-08-17 DIAGNOSIS — M2559 Pain in other specified joint: Secondary | ICD-10-CM | POA: Diagnosis not present

## 2023-08-17 DIAGNOSIS — E559 Vitamin D deficiency, unspecified: Secondary | ICD-10-CM | POA: Diagnosis not present

## 2023-08-17 DIAGNOSIS — R5383 Other fatigue: Secondary | ICD-10-CM

## 2023-08-17 DIAGNOSIS — M810 Age-related osteoporosis without current pathological fracture: Secondary | ICD-10-CM

## 2023-08-17 DIAGNOSIS — M549 Dorsalgia, unspecified: Secondary | ICD-10-CM | POA: Diagnosis not present

## 2023-08-18 LAB — CBC WITH DIFFERENTIAL/PLATELET
Basophils Absolute: 0 10*3/uL (ref 0.0–0.2)
Basos: 1 %
EOS (ABSOLUTE): 0.1 10*3/uL (ref 0.0–0.4)
Eos: 2 %
Hematocrit: 41.7 % (ref 34.0–46.6)
Hemoglobin: 12.9 g/dL (ref 11.1–15.9)
Immature Grans (Abs): 0 10*3/uL (ref 0.0–0.1)
Immature Granulocytes: 0 %
Lymphocytes Absolute: 1.9 10*3/uL (ref 0.7–3.1)
Lymphs: 37 %
MCH: 28.1 pg (ref 26.6–33.0)
MCHC: 30.9 g/dL — ABNORMAL LOW (ref 31.5–35.7)
MCV: 91 fL (ref 79–97)
Monocytes Absolute: 0.5 10*3/uL (ref 0.1–0.9)
Monocytes: 10 %
Neutrophils Absolute: 2.6 10*3/uL (ref 1.4–7.0)
Neutrophils: 50 %
Platelets: 249 10*3/uL (ref 150–450)
RBC: 4.59 x10E6/uL (ref 3.77–5.28)
RDW: 14 % (ref 11.7–15.4)
WBC: 5.2 10*3/uL (ref 3.4–10.8)

## 2023-08-18 LAB — COMPREHENSIVE METABOLIC PANEL
ALT: 11 IU/L (ref 0–32)
AST: 13 IU/L (ref 0–40)
Albumin: 4 g/dL (ref 3.8–4.8)
Alkaline Phosphatase: 86 IU/L (ref 44–121)
BUN/Creatinine Ratio: 19 (ref 12–28)
BUN: 16 mg/dL (ref 8–27)
Bilirubin Total: 0.5 mg/dL (ref 0.0–1.2)
CO2: 24 mmol/L (ref 20–29)
Calcium: 9.6 mg/dL (ref 8.7–10.3)
Chloride: 105 mmol/L (ref 96–106)
Creatinine, Ser: 0.83 mg/dL (ref 0.57–1.00)
Globulin, Total: 2.3 g/dL (ref 1.5–4.5)
Glucose: 65 mg/dL — ABNORMAL LOW (ref 70–99)
Potassium: 4.5 mmol/L (ref 3.5–5.2)
Sodium: 142 mmol/L (ref 134–144)
Total Protein: 6.3 g/dL (ref 6.0–8.5)
eGFR: 75 mL/min/{1.73_m2} (ref >59)

## 2023-08-18 LAB — T4, FREE: Free T4: 1.67 ng/dL (ref 0.82–1.77)

## 2023-08-18 LAB — TSH: TSH: 1.19 u[IU]/mL (ref 0.450–4.500)

## 2023-08-18 LAB — CYCLIC CITRUL PEPTIDE ANTIBODY, IGG/IGA: Cyclic Citrullin Peptide Ab: 131 U — ABNORMAL HIGH (ref 0–19)

## 2023-08-18 LAB — SEDIMENTATION RATE: Sed Rate: 5 mm/h (ref 0–40)

## 2023-08-18 LAB — RHEUMATOID FACTOR: Rheumatoid fact SerPl-aCnc: <10 [IU]/mL (ref <14.0)

## 2023-08-18 LAB — ENA+DNA/DS+SJORGEN'S
ENA RNP Ab: <0.2 AI (ref 0.0–0.9)
ENA SM Ab Ser-aCnc: <0.2 AI (ref 0.0–0.9)
ENA SSA (RO) Ab: <0.2 AI (ref 0.0–0.9)
ENA SSB (LA) Ab: <0.2 AI (ref 0.0–0.9)
dsDNA Ab: 24 [IU]/mL — ABNORMAL HIGH (ref 0–9)

## 2023-08-18 LAB — VITAMIN D 25 HYDROXY (VIT D DEFICIENCY, FRACTURES): Vit D, 25-Hydroxy: 25.6 ng/mL — ABNORMAL LOW (ref 30.0–100.0)

## 2023-08-18 LAB — C-REACTIVE PROTEIN: CRP: <1 mg/L (ref 0–10)

## 2023-08-18 LAB — ANA W/REFLEX: Anti Nuclear Antibody (ANA): POSITIVE — AB

## 2023-08-19 ENCOUNTER — Telehealth: Payer: Self-pay

## 2023-08-19 NOTE — Progress Notes (Unsigned)
   Care Guide Note  08/19/2023 Name: Victoria Pena MRN: [id number] DOB: Feb 15, 1951  Referred by: Blane Ohara, MD Reason for referral : Care Coordination (Outreach to schedule with Pharm d )   Victoria Pena is a 72 y.o. year old female who is a primary care patient of Cox, Kirsten, MD. Victoria Pena was referred to the pharmacist for assistance related to HTN and HLD.    An unsuccessful telephone outreach was attempted today to contact the patient who was referred to the pharmacy team for assistance with medication assistance. Additional attempts will be made to contact the patient.   Penne Lash, RMA Care Guide [facility]  [place], [place] [address] Direct Dial: [phone] [email or web address]

## 2023-08-20 ENCOUNTER — Encounter: Payer: Self-pay | Admitting: Family Medicine

## 2023-08-20 DIAGNOSIS — M2550 Pain in unspecified joint: Secondary | ICD-10-CM | POA: Insufficient documentation

## 2023-08-20 DIAGNOSIS — R5383 Other fatigue: Secondary | ICD-10-CM | POA: Insufficient documentation

## 2023-08-20 NOTE — Assessment & Plan Note (Signed)
Check labs 

## 2023-08-20 NOTE — Assessment & Plan Note (Signed)
Vitamin D level will be checked today  Encourage to eat vitamin rich diets

## 2023-08-20 NOTE — Assessment & Plan Note (Addendum)
Will try to get forteo for patient in place of Prolia 60 mg IM every 6 months Recommend calcium citrate with D 1200-1500 mg daily.

## 2023-08-22 ENCOUNTER — Other Ambulatory Visit: Payer: Self-pay | Admitting: Family Medicine

## 2023-08-22 DIAGNOSIS — R899 Unspecified abnormal finding in specimens from other organs, systems and tissues: Secondary | ICD-10-CM

## 2023-08-22 DIAGNOSIS — M2559 Pain in other specified joint: Secondary | ICD-10-CM

## 2023-08-22 MED ORDER — CELECOXIB 200 MG PO CAPS: 200.0000 mg | ORAL_CAPSULE | Freq: Every day | ORAL | 0 refills | Status: DC

## 2023-08-22 MED ORDER — VITAMIN D (ERGOCALCIFEROL) 50000 UNITS PO CAPS: 1.0000 | ORAL_CAPSULE | ORAL | 0 refills | Status: DC

## 2023-08-24 ENCOUNTER — Telehealth: Payer: Self-pay

## 2023-08-24 ENCOUNTER — Other Ambulatory Visit: Payer: Medicare HMO | Admitting: Pharmacist

## 2023-08-24 ENCOUNTER — Encounter: Payer: Self-pay | Admitting: Family Medicine

## 2023-08-24 DIAGNOSIS — G8929 Other chronic pain: Secondary | ICD-10-CM | POA: Insufficient documentation

## 2023-08-24 NOTE — Patient Instructions (Addendum)
Victoria Pena,  It was great speaking with you today! I reviewed the medication that Dr. Sedalia Muta is trying to order for bone building, and unfortunately no cost-lowering programs exist for that one. I am going to see if she wants to consider an alternative, and if so, we will be in touch. More to come.  Take care, [name], PharmD, BCPS Clinical Pharmacist [facility]

## 2023-08-24 NOTE — Telephone Encounter (Signed)
Patient called and stated that she finally got her appointment with Rheumatology but they can't see her until February of next year. She is wanting to know if this is something she needs to wait on or do we need to refer her some where else. Please advise.

## 2023-08-24 NOTE — Progress Notes (Signed)
   Care Guide Note  08/24/2023 Name: [name] MRN: [id number] DOB: 08/22/1951  Referred by: Blane Ohara, MD Reason for referral : Care Coordination (Outreach to schedule with Pharm d )   Victoria Pena is a 72 y.o. year old female who is a primary care patient of Cox, Kirsten, MD. [name] was referred to the pharmacist for assistance related to HTN and HLD.    Successful contact was made with the patient to discuss pharmacy services including being ready for the pharmacist to call at least 5 minutes before the scheduled appointment time, to have medication bottles and any blood sugar or blood pressure readings ready for review. The patient agreed to meet with the pharmacist via with the pharmacist via telephone visit on (date/time).  08/24/2023  Penne Lash, RMA Care Guide [facility]  [place], [place] [address] Direct Dial: [phone] [email or web address]

## 2023-08-24 NOTE — Progress Notes (Signed)
08/24/2023 Name: Victoria Pena MRN: [id number] DOB: 1951/05/22   Victoria Pena is a 72 y.o. year old female who presented for a telephone visit.   They were referred to the pharmacist by their PCP for assistance in managing medication access - evenity.     Subjective:  Care Team: Primary Care Provider: Blane Ohara, MD  Medication Access/Adherence  Current Pharmacy:  [facility] [phone] Victoria Pena, [place] - [address] AT [facility] [address] [place] [address] Phone: [phone] Fax: [phone]  [facility] - [place], [place] - [address] [place] [address] Phone: [phone] Fax: [phone]   Patient reports affordability concerns with their medications: No  Patient reports access/transportation concerns to their pharmacy: No  Patient reports adherence concerns with their medications:  No     Osteoporosis:  Current medications: prolia 60mg  every 6 months, last injection June 2024 Medications tried in the past:   Current supplements: Calcium gummies + vitamin D 50,000 unit twice weekly    Most recent DEXA: 08/03/23, T score 3.7  Current medication access support: none at present   Objective:     Medications Reviewed Today     Reviewed by Victoria Pena, [facility] (Pharmacist) on 08/24/23 at 1118  Med List Status: <None>   Medication Order Taking? Sig Documenting Provider Last Dose Status Informant  ALPRAZolam (XANAX) 0.5 MG tablet [id number] Yes TAKE 1 TABLET(0.5 MG) BY MOUTH TWICE DAILY AS NEEDED FOR ANXIETY Cox, Kirsten, MD Taking Active   atorvastatin (LIPITOR) 40 MG tablet [id number] Yes Take 1 tablet (40 mg total) by mouth daily. Cox, Kirsten, MD Taking Active   buPROPion (WELLBUTRIN XL) 300 MG 24 hr tablet [id number] Yes Take 1 tablet (300 mg total) by mouth daily. Cox, Kirsten, MD Taking Active   Calcium Carb-Cholecalciferol (CALCIUM 500 + D3)  500-5 MG-MCG TABS [id number] Yes Take 3 tablets by mouth daily.  Patient taking differently: Take 1 tablet by mouth in the Pena and at bedtime.   Victoria Miyamoto, MD Taking Active Self  calcium carbonate (TUMS - DOSED IN MG ELEMENTAL CALCIUM) 500 MG chewable tablet [id number] Yes Chew 1 tablet by mouth 2 (two) times daily as needed for indigestion or heartburn. [provider] Taking Active Self  celecoxib (CELEBREX) 200 MG capsule [id number] Yes Take 1 capsule (200 mg total) by mouth daily. Victoria Ohara, MD Taking Active            Med Note Victoria Pena Aug 24, 2023 11:14 AM) Will pick this up and start soon, as of 08/24/23 review  denosumab (PROLIA) 60 MG/ML SOSY injection [id number] Yes Inject 60 mg into the skin every 6 (six) months. Cox, Kirsten, MD Taking Active   EPINEPHrine 0.3 mg/0.3 mL IJ SOAJ injection [id number] Yes Inject 0.3 mg into the muscle as needed for anaphylaxis. Victoria Morning, NP Taking Active Self  escitalopram (LEXAPRO) 10 MG tablet [id number] Yes Take 1 tablet (10 mg total) by mouth daily. Cox, Kirsten, MD Taking Active   famotidine (PEPCID) 40 MG tablet [id number] Yes Take 1 tablet (40 mg total) by mouth daily. Cox, Kirsten, MD Taking Active   fluticasone [facility]) 50 MCG/ACT nasal spray [id number] Yes Place 2 sprays into both nostrils daily.  Patient taking differently: Place 2 sprays into both nostrils at bedtime.   Victoria Morning, NP Taking Active Self  levothyroxine (SYNTHROID) 125 MCG tablet  [id number] Yes TAKE 1 TABLET BY MOUTH DAILY BEFORE BREAKFAST Cox, Kirsten, MD Taking Active   losartan (COZAAR) 50 MG tablet [id number] Yes Take 1 tablet (50 mg total) by mouth daily. Victoria Del, FNP Taking Active   metroNIDAZOLE (METROGEL) 0.75 % gel [id number] Yes Apply 1 application topically 2 (two) times daily.  Patient taking differently: Apply 1 application  topically 2 (two) times daily as needed (rosacea).   Victoria Miyamoto, MD Taking Active  Self  Multiple Vitamin (MULTIVITAMIN WITH MINERALS) TABS tablet [id number] Yes Take 1 tablet by mouth in the Pena and at bedtime. [provider] Taking Active Self  mupirocin ointment (BACTROBAN) 2 % [id number] Yes Apply 1 Application topically 2 (two) times daily. Victoria Miyamoto, MD Taking Active Self  Oxycodone HCl 10 MG TABS [id number] Yes Take 1 tablet (10 mg total) by mouth 3 (three) times daily as needed ((score 7 to 10)). Victoria Gauss, PA Taking Active   pantoprazole (PROTONIX) 40 MG tablet [id number] Yes Take 1 tablet (40 mg total) by mouth daily. Victoria Ohara, MD Taking Active   pregabalin (LYRICA) 150 MG capsule [id number] Yes TAKE 1 CAPSULE(150 MG) BY MOUTH TWICE DAILY Cox, Kirsten, MD Taking Active   Spacer/Aero-Holding [facility] [id number] Yes 1 each by Does not apply route daily. Victoria Del, FNP Taking Active Self  trolamine salicylate (ASPERCREME) 10 % cream [id number] Yes Apply 1 Application topically as needed for muscle pain. [provider] Taking Active Self  Vitamin D, Ergocalciferol, 50000 units CAPS [id number] Yes Take 1 capsule by mouth 2 (two) times a week. Cox, Kirsten, MD Taking Active               Assessment/Plan:   Osteoporosis: - Currently appropriately managed - Evaluated for medication access and removing cost barriers for evenity, unfortunately no cost savings programs at this time. May consider medication alternative forteo for bone building - will discuss with PCP  - Reviewed recommendation for daily calcium intake of 1200 mg and vitamin D intake of [phone] units - Recommended to choose calcium citrate formulation due to concurrent acid reflux medication - Recommend to continue current regimen for now   Follow Up Plan: 2 weeks pending PCP medication orders/ med access needs  Lynnda Shields, PharmD, BCPS Clinical Pharmacist [facility]

## 2023-08-25 ENCOUNTER — Other Ambulatory Visit: Payer: Self-pay

## 2023-08-25 ENCOUNTER — Other Ambulatory Visit: Payer: Medicare HMO

## 2023-08-25 ENCOUNTER — Other Ambulatory Visit: Payer: Self-pay | Admitting: Family Medicine

## 2023-08-25 DIAGNOSIS — F418 Other specified anxiety disorders: Secondary | ICD-10-CM

## 2023-08-25 NOTE — Patient Instructions (Signed)
Visit Information  Thank you for taking time to visit with me today. Please don't hesitate to contact me if I can be of assistance to you before our next scheduled telephone appointment.  Following are the goals we discussed today:   Goals Addressed             This Visit's Progress    RNCM Care Management Expected Outcome:  Monitor, Self-Manage and Reduce Symptoms of: Depression, Anxiety, Stress       Current Barriers:  Care Coordination needs related to resources in the community for help with mental health needs and community resources for financial support in a patient with anxiety, depression, and stress Chronic Disease Management support and education needs related to effective management of stress, anxiety, and depression  Planned Interventions: Evaluation of current treatment plan related to stress, anxiety, and depression and patient's adherence to plan as established by provider.  Is working with the LCSW and states that has been helpful. She has made a bucket list and [facility] She says that she is interested in the socialization and events there. The patient is doing better as far as her depression. The patient is doing okay as far as her depression. The patient has a lot of things going on but is trying to remain positive. She has been told that she has either Lupus or RA but cannot get into see a specialist until next year. The pcp is trying to get her in sooner and the office is contacting the provider. The patient is just concerned about all the health conditions she has going on and her financial situation. She still is remaining positive and just trying to do what she can to get the things she needs and take care of herself. Reflective listening and support given.  Advised patient to call the office for changes in stress, anxiety, and depression Provided education to patient re: working with the CCM team, support available with resources,  ongoing outreaches for effective management of stress, anxiety, and depression Reviewed medications with patient and discussed compliance. The patient is compliant with her medications. Was trying to see if she could get assistance with evinity that may be a better option for her than the prolia. The pharm D has talked with the patient. Will contact the pharm D for additional support if needed.  Reviewed scheduled/upcoming provider appointments including 11-24-2023 at 130 pm Care Guide referral for resources in the community to help with food resources and financial constraints. Has been provided with information. The patient received information from the care guides but she states that none of those programs can actually help her. Will continue to monitor for changes and new programs that may be a resource for the patient.  Social Work referral for ongoing support and education related to effective management of depression, anxiety, stress. The patient has talked with the LCSW. She knows she needs to take things slow. She knows she cannot return to the work she previously did. The patient knows the care team is available to assist with needs. Continues to work with the SW on a regular basis.  Pharmacy referral for ongoing support and education of medication needs and management. Ongoing support and education from the pharm D. Discussed plans with patient for ongoing care management follow up and provided patient with direct contact information for care management team Advised patient to discuss changes in mood, anxiety, depression, or mental health needs with provider Screening for signs  and symptoms of depression related to chronic disease state  Assessed social determinant of health barriers Discussed self care and doing hobbies that make the patient happy or going on events that do not cost money, or doing things with family and friends. Talked to the patient about [email or web address]org as this is an online  bible study group that may help also with Bible study resources.   The patient knows to call the [facility] in between outreaches for new needs or concerns.   Symptom Management: Take medications as prescribed   Attend all scheduled provider appointments Call provider office for new concerns or questions  call the Suicide and Crisis Lifeline: 988 call the [place] National Suicide Prevention Lifeline: 531-322-8971 or TTY: (785)175-8566 TTY 873 589 3412) to talk to a trained counselor call 1-800-273-TALK (toll free, 24 hour hotline) if experiencing a Mental Health or Behavioral Health Crisis   Follow Up Plan: Telephone follow up appointment with care management team member scheduled for:09-28-2023 at 345 pm        RNCM Care Management Expected Outcome:  Monitor, Self-Manage, and Reduce Symptoms of Hypertension       Current Barriers:  Chronic Disease Management support and education needs related to effective management of HTN BP Readings from Last 3 Encounters:  08/17/23 130/70  06/08/23 100/70  03/03/23 106/68    Planned Interventions: Evaluation of current treatment plan related to hypertension self management and patient's adherence to plan as established by provider. Her blood pressures are stable and she denies any acute changes in her HTN or heart health. Will continue to monitor.    Provided education to patient re: stroke prevention, s/s of heart attack and stroke; Reviewed prescribed diet heart healthy diet. The patient is compliant with a heart healthy diet.  Reviewed medications with patient and discussed importance of compliance. The patient is compliant with medications. Works with the pharm D on an ongoing basis;  Discussed plans with patient for ongoing care management follow up and provided patient with direct contact information for care management team; Advised patient, providing education and rationale, to monitor blood pressure daily and record, calling PCP for findings outside  established parameters;  Reviewed scheduled/upcoming provider appointments including: 11-24-2023 at 130 pm Advised patient to discuss changes in HTN or heart health with provider; Provided education on prescribed diet heart healthy ;  Discussed complications of poorly controlled blood pressure such as heart disease, stroke, circulatory complications, vision complications, kidney impairment, sexual dysfunction;  Screening for signs and symptoms of depression related to chronic disease state;  Assessed social determinant of health barriers;   Symptom Management: Take medications as prescribed   Attend all scheduled provider appointments Call provider office for new concerns or questions  call the Suicide and Crisis Lifeline: 988 call the [place] National Suicide Prevention Lifeline: 2144892579 or TTY: 351-771-0698 TTY 573-040-0833) to talk to a trained counselor call 1-800-273-TALK (toll free, 24 hour hotline) if experiencing a Mental Health or Behavioral Health Crisis  check blood pressure weekly learn about high blood pressure call doctor for signs and symptoms of high blood pressure develop an action plan for high blood pressure keep all doctor appointments take medications for blood pressure exactly as prescribed report new symptoms to your doctor  Follow Up Plan: Telephone follow up appointment with care management team member scheduled for: 09-28-2023 at 345 pm       RNCM Care Management Maintain, Monitor and Self-Manage Symptoms of COPD       Current Barriers:  Chronic Disease Management support  and education needs related to effective COPD  Planned Interventions: Provided patient with basic written and verbal COPD education on self care/management/and exacerbation prevention. The patient states that she is staying out of the heat and being careful. She has gained weight but she is hoping that with the cooler weather she can get out and walk.  Denies any acute exacerbations of her  COPD.  Advised patient to track and manage COPD triggers. Is aware of things that exacerbate her condition. Provided written and verbal instructions on pursed lip breathing and utilized returned demonstration as teach back Provided instruction about proper use of medications used for management of COPD including inhalers Advised patient to self assesses COPD action plan zone and make appointment with provider if in the yellow zone for 48 hours without improvement Advised patient to engage in light exercise as tolerated 3-5 days a week to aid in the the management of COPD. The patient is doing some things outside and wants to be more active. She deals with chronic pain and will be seeing the neurosurgeon again after her MRI for results and recommendations for her back pain and discomfort. The patient is having periods of numbness in her legs and has not had this before.  Provided education about and advised patient to utilize infection prevention strategies to reduce risk of respiratory infection. The patient is mindful of her surroundings and is safe. The patient knows what worse looks like and to call for changes or needs.  Discussed the importance of adequate rest and management of fatigue with COPD Screening for signs and symptoms of depression related to chronic disease state  Assessed social determinant of health barriers The patient is dealing with a lot of pain and discomfort and may have RA or Lupus. She cannot get in to see the specialist until next year. The pcp is working with her on getting in sooner.  Symptom Management: Take medications as prescribed   Attend all scheduled provider appointments Call provider office for new concerns or questions  call the Suicide and Crisis Lifeline: 988 call the [place] National Suicide Prevention Lifeline: 609-121-7911 or TTY: 385-202-4901 TTY 360-488-2453) to talk to a trained counselor call 1-800-273-TALK (toll free, 24 hour hotline) if  experiencing a Mental Health or Behavioral Health Crisis  identify and remove indoor air pollutants limit outdoor activity during cold weather listen for public air quality announcements every day develop a rescue plan eliminate symptom triggers at home follow rescue plan if symptoms flare-up  Follow Up Plan: Telephone follow up appointment with care management team member scheduled for: 09-28-2023 and 345 pm           Our next appointment is by telephone on 09-28-2023 at 0345 pm  Please call the care guide team at [phone] if you need to cancel or reschedule your appointment.   If you are experiencing a Mental Health or Behavioral Health Crisis or need someone to talk to, please call the Suicide and Crisis Lifeline: 988 call the [place] National Suicide Prevention Lifeline: 804-527-1543 or TTY: 548-811-8779 TTY 516 641 7422) to talk to a trained counselor call 1-800-273-TALK (toll free, 24 hour hotline)   Patient verbalizes understanding of instructions and care plan provided today and agrees to view in MyChart. Active MyChart status and patient understanding of how to access instructions and care plan via MyChart confirmed with patient.     Telephone follow up appointment with care management team member scheduled for: 09-28-2023 at 345 pm  Alto Denver RN, MSN, CCM RN  Care Manager  [facility]  Ambulatory Care Management  Direct Number: [phone]

## 2023-08-25 NOTE — Patient Outreach (Signed)
Care Management   Visit Note  08/25/2023 Name: Victoria Pena MRN: [id number] DOB: 05-Sep-1951  Subjective: Victoria Pena is a 72 y.o. year old female who is a primary care patient of Cox, Kirsten, MD. The Care Management team was consulted for assistance.      Engaged with patient spoke with patient by telephone.    Goals Addressed             This Visit's Progress    RNCM Care Management Expected Outcome:  Monitor, Self-Manage and Reduce Symptoms of: Depression, Anxiety, Stress       Current Barriers:  Care Coordination needs related to resources in the community for help with mental health needs and community resources for financial support in a patient with anxiety, depression, and stress Chronic Disease Management support and education needs related to effective management of stress, anxiety, and depression  Planned Interventions: Evaluation of current treatment plan related to stress, anxiety, and depression and patient's adherence to plan as established by provider.  Is working with the LCSW and states that has been helpful. She has made a bucket list and she [facility] She says that she is interested in the socialization and events there. The patient is doing better as far as her depression. The patient is doing okay as far as her depression. The patient has a lot of things going on but is trying to remain positive. She has been told that she has either Lupus or RA but cannot get into see a specialist until next year. The pcp is trying to get her in sooner and the office is contacting the provider. The patient is just concerned about all the health conditions she has going on and her financial situation. She still is remaining positive and just trying to do what she can to get the things she needs and take care of herself. Reflective listening and support given.  Advised patient to call the office for changes in stress, anxiety, and  depression Provided education to patient re: working with the CCM team, support available with resources, ongoing outreaches for effective management of stress, anxiety, and depression Reviewed medications with patient and discussed compliance. The patient is compliant with her medications. Was trying to see if she could get assistance with evinity that may be a better option for her than the prolia. The pharm D has talked with the patient. Will contact the pharm D for additional support if needed.  Reviewed scheduled/upcoming provider appointments including 11-24-2023 at 130 pm Care Guide referral for resources in the community to help with food resources and financial constraints. Has been provided with information. The patient received information from the care guides but she states that none of those programs can actually help her. Will continue to monitor for changes and new programs that may be a resource for the patient.  Social Work referral for ongoing support and education related to effective management of depression, anxiety, stress. The patient has talked with the LCSW. She knows she needs to take things slow. She knows she cannot return to the work she previously did. The patient knows the care team is available to assist with needs. Continues to work with the SW on a regular basis.  Pharmacy referral for ongoing support and education of medication needs and management. Ongoing support and education from the pharm D. Discussed plans with patient for ongoing care management follow up and provided patient with direct contact information for care management  team Advised patient to discuss changes in mood, anxiety, depression, or mental health needs with provider Screening for signs and symptoms of depression related to chronic disease state  Assessed social determinant of health barriers Discussed self care and doing hobbies that make the patient happy or going on events that do not cost money,  or doing things with family and friends. Talked to the patient about BSFonline[employer]org as this is an online bible study group that may help also with Bible study resources.   The patient knows to call the [facility] in between outreaches for new needs or concerns.   Symptom Management: Take medications as prescribed   Attend all scheduled provider appointments Call provider office for new concerns or questions  call the Suicide and Crisis Lifeline: 988 call the [place] National Suicide Prevention Lifeline: (404)777-2711 or TTY: (802)691-2004 TTY 939-440-2868) to talk to a trained counselor call 1-800-273-TALK (toll free, 24 hour hotline) if experiencing a Mental Health or Behavioral Health Crisis   Follow Up Plan: Telephone follow up appointment with care management team member scheduled for:09-28-2023 at 345 pm        RNCM Care Management Expected Outcome:  Monitor, Self-Manage, and Reduce Symptoms of Hypertension       Current Barriers:  Chronic Disease Management support and education needs related to effective management of HTN BP Readings from Last 3 Encounters:  08/17/23 130/70  06/08/23 100/70  03/03/23 106/68    Planned Interventions: Evaluation of current treatment plan related to hypertension self management and patient's adherence to plan as established by provider. Her blood pressures are stable and she denies any acute changes in her HTN or heart health. Will continue to monitor.    Provided education to patient re: stroke prevention, s/s of heart attack and stroke; Reviewed prescribed diet heart healthy diet. The patient is compliant with a heart healthy diet.  Reviewed medications with patient and discussed importance of compliance. The patient is compliant with medications. Works with the pharm D on an ongoing basis;  Discussed plans with patient for ongoing care management follow up and provided patient with direct contact information for care management team; Advised patient,  providing education and rationale, to monitor blood pressure daily and record, calling PCP for findings outside established parameters;  Reviewed scheduled/upcoming provider appointments including: 11-24-2023 at 130 pm Advised patient to discuss changes in HTN or heart health with provider; Provided education on prescribed diet heart healthy ;  Discussed complications of poorly controlled blood pressure such as heart disease, stroke, circulatory complications, vision complications, kidney impairment, sexual dysfunction;  Screening for signs and symptoms of depression related to chronic disease state;  Assessed social determinant of health barriers;   Symptom Management: Take medications as prescribed   Attend all scheduled provider appointments Call provider office for new concerns or questions  call the Suicide and Crisis Lifeline: 988 call the [place] National Suicide Prevention Lifeline: 857-732-8041 or TTY: (531)839-4495 TTY (213)877-9244) to talk to a trained counselor call 1-800-273-TALK (toll free, 24 hour hotline) if experiencing a Mental Health or Behavioral Health Crisis  check blood pressure weekly learn about high blood pressure call doctor for signs and symptoms of high blood pressure develop an action plan for high blood pressure keep all doctor appointments take medications for blood pressure exactly as prescribed report new symptoms to your doctor  Follow Up Plan: Telephone follow up appointment with care management team member scheduled for: 09-28-2023 at 345 pm       [facility] Care Management Maintain,  Monitor and Self-Manage Symptoms of COPD       Current Barriers:  Chronic Disease Management support and education needs related to effective COPD  Planned Interventions: Provided patient with basic written and verbal COPD education on self care/management/and exacerbation prevention. The patient states that she is staying out of the heat and being careful. She has gained weight  but she is hoping that with the cooler weather she can get out and walk.  Denies any acute exacerbations of her COPD.  Advised patient to track and manage COPD triggers. Is aware of things that exacerbate her condition. Provided written and verbal instructions on pursed lip breathing and utilized returned demonstration as teach back Provided instruction about proper use of medications used for management of COPD including inhalers Advised patient to self assesses COPD action plan zone and make appointment with provider if in the yellow zone for 48 hours without improvement Advised patient to engage in light exercise as tolerated 3-5 days a week to aid in the the management of COPD. The patient is doing some things outside and wants to be more active. She deals with chronic pain and will be seeing the neurosurgeon again after her MRI for results and recommendations for her back pain and discomfort. The patient is having periods of numbness in her legs and has not had this before.  Provided education about and advised patient to utilize infection prevention strategies to reduce risk of respiratory infection. The patient is mindful of her surroundings and is safe. The patient knows what worse looks like and to call for changes or needs.  Discussed the importance of adequate rest and management of fatigue with COPD Screening for signs and symptoms of depression related to chronic disease state  Assessed social determinant of health barriers The patient is dealing with a lot of pain and discomfort and may have RA or Lupus. She cannot get in to see the specialist until next year. The pcp is working with her on getting in sooner.  Symptom Management: Take medications as prescribed   Attend all scheduled provider appointments Call provider office for new concerns or questions  call the Suicide and Crisis Lifeline: 988 call the [place] National Suicide Prevention Lifeline: 770-773-7542 or TTY: (770) 258-2730 TTY  (830)376-7353) to talk to a trained counselor call 1-800-273-TALK (toll free, 24 hour hotline) if experiencing a Mental Health or Behavioral Health Crisis  identify and remove indoor air pollutants limit outdoor activity during cold weather listen for public air quality announcements every day develop a rescue plan eliminate symptom triggers at home follow rescue plan if symptoms flare-up  Follow Up Plan: Telephone follow up appointment with care management team member scheduled for: 09-28-2023 and 345 pm           Consent to Services:  Patient was given information about care management services, agreed to services, and gave verbal consent to participate.   Plan: Telephone follow up appointment with care management team member scheduled for: 09-28-2023 at 345 pm Alto Denver RN, MSN, CCM RN Care Manager  [facility]  Ambulatory Care Management  Direct Number: [phone]

## 2023-08-26 ENCOUNTER — Telehealth: Payer: Self-pay | Admitting: Pharmacist

## 2023-08-26 ENCOUNTER — Other Ambulatory Visit: Payer: Medicare HMO | Admitting: Pharmacist

## 2023-08-26 MED ORDER — TERIPARATIDE 600 MCG/2.4ML ~~LOC~~ SOPN: 20.0000 ug | PEN_INJECTOR | Freq: Every day | SUBCUTANEOUS | 11 refills | Status: DC

## 2023-08-26 NOTE — Progress Notes (Signed)
Care Coordination Call  Patient contacted me in response to outreach for medication management - osteoporosis.   Facilitated forteo in collaboration with PCP, was previously approved for forteo 2024 with patient assistance via [employer]. Supply was delivered 03/2023 to office but was not picked up due to patient starting prolia instead.   Discussed forteo, counseled on new medication, answered patient questions.  Will facilitate rx med assistance team to outreach [employer] for status of PAP, may be able to initiate a new shipment, versus re-applying?  Follow-up: scheduled 09/07/23 at 1:30pm with patient for ongoing patient assistance processing

## 2023-08-26 NOTE — Patient Instructions (Signed)
[name],  Thanks for calling me back! Sorry about our phone tag today. We will work on getting forteo through the manufacturer again. I will call you with any updates on 09/07/23 at 1:30pm.  Take care, [name], PharmD, BCPS Clinical Pharmacist [facility]

## 2023-08-26 NOTE — Progress Notes (Signed)
Care Coordination Call  Attempted to outreach patient for more updates to medication management for osteoporosis. No answer, left VM to return call at her convenience. Will attempt future outreaches.  Lynnda Shields, PharmD, BCPS Clinical Pharmacist [facility]

## 2023-08-29 ENCOUNTER — Other Ambulatory Visit: Payer: Self-pay

## 2023-08-29 ENCOUNTER — Telehealth: Payer: Self-pay

## 2023-08-29 NOTE — Telephone Encounter (Signed)
[facility] called about a PA on Forteo.  The Forteo has been cancelled.  Medication was taken out of patient's chart.

## 2023-08-31 ENCOUNTER — Telehealth: Payer: Self-pay

## 2023-08-31 ENCOUNTER — Other Ambulatory Visit: Payer: Self-pay | Admitting: Family Medicine

## 2023-08-31 MED ORDER — INSULIN PEN NEEDLE 32G X 4 MM MISC: 1.0000 | Freq: Every day | 3 refills | Status: DC

## 2023-08-31 NOTE — Telephone Encounter (Signed)
Patient called stating hat she picked up her Forteo prescription from her pharmacy and did not have the needles and stated that she would need a RX for the needles to go with the Forteo and is requesting them to sent to the pharmacy. She states that the pharmacy gave her enough for the month but needs a new rx sent to the pharmacy for them.

## 2023-09-01 ENCOUNTER — Other Ambulatory Visit: Payer: Self-pay

## 2023-09-01 DIAGNOSIS — I10 Essential (primary) hypertension: Secondary | ICD-10-CM

## 2023-09-01 DIAGNOSIS — M545 Low back pain, unspecified: Secondary | ICD-10-CM

## 2023-09-01 MED ORDER — LOSARTAN POTASSIUM 50 MG PO TABS: 50.0000 mg | ORAL_TABLET | Freq: Every day | ORAL | 1 refills | Status: DC

## 2023-09-01 NOTE — Telephone Encounter (Signed)
Patient informed. 

## 2023-09-02 ENCOUNTER — Ambulatory Visit: Payer: Medicare HMO | Admitting: Physician Assistant

## 2023-09-02 LAB — HLA-B27 ANTIGEN: HLA B27: NEGATIVE

## 2023-09-02 LAB — SPECIMEN STATUS REPORT

## 2023-09-02 MED ORDER — OXYCODONE HCL 10 MG PO TABS: 10.0000 mg | ORAL_TABLET | Freq: Three times a day (TID) | ORAL | 0 refills | Status: DC | PRN

## 2023-09-05 ENCOUNTER — Ambulatory Visit: Payer: Self-pay | Admitting: *Deleted

## 2023-09-05 NOTE — Patient Outreach (Addendum)
Care Coordination   Follow Up Visit Note   09/05/2023 Name: Victoria Pena MRN: [id number] DOB: [birth year]  Victoria Pena is a 72 y.o. year old female who sees Cox, Fritzi Mandes, MD for primary care. I spoke with  Marko Stai Ahlers by phone today.  What matters to the patients health and wellness today?  Doing ok- foot discomfort (hx of foot surgery).    Goals Addressed             This Visit's Progress    Depression minimized       Activities and task to complete in order to accomplish goals.   Practice good self care with healthy boundaries  Continue working on the quilt you are making! Continue Wellbutrin dose prescribed by Dr Sedalia Muta Continue to do things that you enjoy- reading, sewing, etc Continue to manage your budget/finances and seek ways to cut cost where you possibly can Continue to seek part time job as you shared Consider journaling Inquire with your DSS/Medicaid office about possible eligiblity for full Medicaid (as opposed to current partial Medicaid that only pays your premium) as well as compare options for open enrollment in October Continue with your positive self care- walking,etc Upcoming Pain Clinic visit  Attend gym.silver sneakers  `          SDOH assessments and interventions completed:  Yes     Care Coordination Interventions:  Yes, provided  Interventions Today    Flowsheet Row Most Recent Value  Chronic Disease   Chronic disease during today's visit Other  [depression/pain]  General Interventions   General Interventions Discussed/Reviewed [facility], Doctor Visits  Doctor Visits Discussed/Reviewed Specialist  [10/3 to see pain clinic provider]  Exercise Interventions   Exercise Discussed/Reviewed Exercise Discussed  [pt is walking some and hopes to increase the frequency as the weather is cooling. Not able to go to gym regularly bc of gas $]  Mental Health Interventions   Mental Health Discussed/Reviewed Other  [looking forward  to the birth of a grandson next month]  Pharmacy Interventions   Pharmacy Dicussed/Reviewed Pharmacy Topics Discussed  [Pt reports taking Forteo QD for 2 years-]       Follow up plan: Follow up call scheduled for 10/03/23    Encounter Outcome:  Patient Visit Completed

## 2023-09-05 NOTE — Patient Instructions (Signed)
Visit Information  Thank you for taking time to visit with me today. Please don't hesitate to contact me if I can be of assistance to you.   Following are the goals we discussed today:   Goals Addressed             This Visit's Progress    Depression minimized       Activities and task to complete in order to accomplish goals.   Practice good self care with healthy boundaries  Continue working on the quilt you are making! Continue Wellbutrin dose prescribed by Dr Sedalia Muta Continue to do things that you enjoy- reading, sewing, etc Continue to manage your budget/finances and seek ways to cut cost where you possibly can Continue to seek part time job as you shared Consider journaling Inquire with your DSS/Medicaid office about possible eligiblity for full Medicaid (as opposed to current partial Medicaid that only pays your premium) as well as compare options for open enrollment in October Continue with your positive self care- walking,etc Upcoming Pain Clinic visit  Attend gym.silver sneakers  `          Our next appointment is by telephone on 10/03/23   Please call the care guide team at [phone] if you need to cancel or reschedule your appointment.   If you are experiencing a Mental Health or Behavioral Health Crisis or need someone to talk to, please call the Suicide and Crisis Lifeline: 988 call 911   The patient verbalized understanding of instructions, educational materials, and care plan provided today and DECLINED offer to receive copy of patient instructions, educational materials, and care plan.   Telephone follow up appointment with care management team member scheduled for: 10/03/23 Reece Levy, MSW, LCSW Clinical Social Worker [phone]

## 2023-09-07 ENCOUNTER — Other Ambulatory Visit: Payer: Medicare HMO | Admitting: Pharmacist

## 2023-09-07 NOTE — Progress Notes (Signed)
09/07/2023 Name: Victoria Pena MRN: [id number] DOB: 1951-02-03   Victoria Pena is a 72 y.o. year old female who presented for a telephone visit.   They were referred to the pharmacist by their PCP for assistance in managing medication access - for forteo.   Patient was originally on prolia, provider desired evenity. No cost reduction options exist for medicare for evenity, therefore recommended alternative of forteo to PCP for bone building for 2 years, then back to prolia. Patient has since gotten forteo at no cost through [employer]. She also has approved patient assistance via [employer] from a prior enrollment earlier this year. Refills for the program were reactivated by our rx med assistance team.     Subjective:  Care Team: Primary Care Provider: Blane Ohara, Pena  Medication Access/Adherence  Current Pharmacy:  [facility] [phone] [place] Pena, [place] - [address] AT [facility] [address] [place] [address] Phone: [phone] Fax: [phone]  [facility] - [place], [place] - [address] [place] [address] Phone: [phone] Fax: [phone]   Patient reports affordability concerns with their medications: No  Patient reports access/transportation concerns to their pharmacy: No  Patient reports adherence concerns with their medications:  No     Osteoporosis:  Current medications: forteo SQ daily Medications tried in the past: prolia 60mg  every 6 months, last injection June 2024  Current supplements: Calcium gummies + vitamin D 50,000 unit twice weekly   Most recent DEXA: 08/03/23, T score 3.7  Current medication access support: [employer] PAP   Objective:     Medications Reviewed Today     Reviewed by Victoria Pena, RPH (Pharmacist) on 09/07/23 at 1400  Med List Status: <None>   Medication Order Taking? Sig Documenting Provider Last Dose  Status Informant  ALPRAZolam (XANAX) 0.5 MG tablet [id number]  TAKE 1 TABLET(0.5 MG) BY MOUTH TWICE DAILY AS NEEDED FOR ANXIETY Victoria Pena  Active   atorvastatin (LIPITOR) 40 MG tablet [id number]  Take 1 tablet (40 mg total) by mouth daily. Victoria Pena  Active   buPROPion (WELLBUTRIN XL) 300 MG 24 hr tablet [id number]  TAKE 1 TABLET(300 MG) BY MOUTH DAILY Victoria Pena  Active   Calcium Carb-Cholecalciferol (CALCIUM 500 + D3) 500-5 MG-MCG TABS [id number]  Take 3 tablets by mouth daily.  Patient taking differently: Take 1 tablet by mouth in the Pena and at bedtime.   Victoria Pena  Active Self  calcium carbonate (TUMS - DOSED IN MG ELEMENTAL CALCIUM) 500 MG chewable tablet [id number]  Chew 1 tablet by mouth 2 (two) times daily as needed for indigestion or heartburn. Provider, Historical, Pena  Active Self  celecoxib (CELEBREX) 200 MG capsule [id number]  Take 1 capsule (200 mg total) by mouth daily. Victoria Ohara, Pena  Active            Med Note Victoria Pena Aug 24, 2023 11:14 AM) Will pick this up and start soon, as of 08/24/23 review  EPINEPHrine 0.3 mg/0.3 mL IJ SOAJ injection [id number]  Inject 0.3 mg into the muscle as needed for anaphylaxis. Victoria Pena  Active Self  escitalopram (LEXAPRO) 10 MG tablet [id number]  Take 1 tablet (10 mg total) by mouth daily. Victoria Pena  Active   famotidine (PEPCID) 40 MG tablet [id number]  Take 1 tablet (40 mg total) by mouth daily. Pena,  Kirsten, Pena  Active   fluticasone Aleda Grana) 50 MCG/ACT nasal spray [id number]  Place 2 sprays into both nostrils daily.  Patient taking differently: Place 2 sprays into both nostrils at bedtime.   Victoria Pena  Active Self  FORTEO 600 MCG/2.4ML SOPN [id number] Yes Inject 20 mcg into the skin daily. Provider, Historical, Pena Taking Active   Insulin Pen Needle 32G X 4 MM MISC [id number] Yes 1 each by Does not apply route daily. Victoria Pena Taking Active   levothyroxine (SYNTHROID)  125 MCG tablet [id number]  TAKE 1 TABLET BY MOUTH DAILY BEFORE BREAKFAST Victoria Pena  Active   losartan (COZAAR) 50 MG tablet [id number]  Take 1 tablet (50 mg total) by mouth daily. Victoria Pena  Active   metroNIDAZOLE (METROGEL) 0.75 % gel [id number]  Apply 1 application topically 2 (two) times daily.  Patient taking differently: Apply 1 application  topically 2 (two) times daily as needed (rosacea).   Victoria Pena  Active Self  Multiple Vitamin (MULTIVITAMIN WITH MINERALS) TABS tablet [id number]  Take 1 tablet by mouth in the Pena and at bedtime. Provider, Historical, Pena  Active Self  mupirocin ointment (BACTROBAN) 2 % [id number]  Apply 1 Application topically 2 (two) times daily. Victoria Pena  Active Self  Oxycodone HCl 10 MG TABS [id number]  Take 1 tablet (10 mg total) by mouth 3 (three) times daily as needed ((score 7 to 10)). Langley Gauss, PA  Active   pantoprazole (PROTONIX) 40 MG tablet [id number]  Take 1 tablet (40 mg total) by mouth daily. Victoria Pena  Active   pregabalin (LYRICA) 150 MG capsule [id number]  TAKE 1 CAPSULE(150 MG) BY MOUTH TWICE DAILY Victoria Pena  Active   trolamine salicylate (ASPERCREME) 10 % cream [id number]  Apply 1 Application topically as needed for muscle pain. Provider, Historical, Pena  Active Self  Vitamin D, Ergocalciferol, 50000 units CAPS [id number]  Take 1 capsule by mouth 2 (two) times a week. Victoria Pena  Active               Assessment/Plan:   Osteoporosis: - Currently appropriately managed - Recommend to continue current regimen    Follow Up Plan:4 weeks to assess refill process (via [facility] if remains accessible/no cost versus patient assistance)  Victoria Pena, PharmD, BCPS Clinical Pharmacist [facility]

## 2023-09-07 NOTE — Patient Instructions (Signed)
Victoria Pena,  So glad to hear you've been able to get forteo and its going well.   We will touch base in a month to be sure the refills remain no cost, versus if we need to use the patient assistance.  Take care, [name], PharmD, BCPS Clinical Pharmacist [facility]

## 2023-09-15 ENCOUNTER — Ambulatory Visit: Payer: Medicare HMO | Admitting: Family Medicine

## 2023-09-21 ENCOUNTER — Other Ambulatory Visit: Payer: Self-pay

## 2023-09-21 DIAGNOSIS — M545 Low back pain, unspecified: Secondary | ICD-10-CM

## 2023-09-21 DIAGNOSIS — G8929 Other chronic pain: Secondary | ICD-10-CM

## 2023-09-21 MED ORDER — OXYCODONE HCL 10 MG PO TABS: 10.0000 mg | ORAL_TABLET | Freq: Four times a day (QID) | ORAL | 0 refills | Status: DC | PRN

## 2023-09-22 ENCOUNTER — Other Ambulatory Visit: Payer: Medicare HMO

## 2023-09-22 ENCOUNTER — Telehealth: Payer: Self-pay

## 2023-09-22 DIAGNOSIS — M5416 Radiculopathy, lumbar region: Secondary | ICD-10-CM | POA: Diagnosis not present

## 2023-09-22 NOTE — Telephone Encounter (Signed)
Patient pick up Patient Assistant (forteo), also wanted to make provider aware she seen Dr. Yetta Barre Assistant today and they are referring her to Dr. Cleopatra Cedar for shot in her back for pain due to her bone tissue being to soft she doesn't not qualify for back surgery, she stated she will can make appointment with her pain clinic so they can send her pain ill when it is due fir refill.

## 2023-09-28 ENCOUNTER — Other Ambulatory Visit: Payer: Medicare HMO

## 2023-09-28 ENCOUNTER — Other Ambulatory Visit: Payer: Self-pay

## 2023-09-28 NOTE — Patient Outreach (Signed)
Care Management   Visit Note  09/28/2023 Name: Victoria Pena MRN: [id number] DOB: September 29, 1951  Subjective: Victoria Pena is a 72 y.o. year old female who is a primary care patient of Cox, Kirsten, MD. The Care Management team was consulted for assistance.      Engaged with patient spoke with patient by telephone.    Goals Addressed             This Visit's Progress    RNCM Care Management Expected Outcome:  Monitor, Self-Manage and Reduce Symptoms of: Depression, Anxiety, Stress       Current Barriers:  Care Coordination needs related to resources in the community for help with mental health needs and community resources for financial support in a patient with anxiety, depression, and stress Chronic Disease Management support and education needs related to effective management of stress, anxiety, and depression  Planned Interventions: Evaluation of current treatment plan related to stress, anxiety, and depression and patient's adherence to plan as established by provider.  Is working with the LCSW and states that has been helpful. She has made a bucket list and she [facility] She says that she is interested in the socialization and events there. She has been down some recently. She is having a lot of back pain and they cannot do surgery because of her bone integrity. The patient states that she is going to Dr. Cleopatra Cedar next week and will discuss options with him. She also is happy that the test came back that she does not have RA or Lupus. Allowed the patient to talk about things and she is happy that her daughter in laws mom is doing better and she has a new great grandson. Reflective listening and support given.  Advised patient to call the office for changes in stress, anxiety, and depression Provided education to patient re: working with the CCM team, support available with resources, ongoing outreaches for effective management of stress, anxiety, and  depression Reviewed medications with patient and discussed compliance. The patient is compliant with her medications. She was able to get pap for forteo and has picked this up from the office.  The pharm D has talked with the patient. Has upcoming appointment on 10-06-2023 with the pharm D. Reviewed scheduled/upcoming provider appointments including 11-24-2023 at 130 pm Care Guide referral for resources in the community to help with food resources and financial constraints. Has been provided with information. The patient received information from the care guides but she states that none of those programs can actually help her. Will continue to monitor for changes and new programs that may be a resource for the patient.  Social Work referral for ongoing support and education related to effective management of depression, anxiety, stress. The patient has talked with the LCSW. She knows she needs to take things slow. The patient knows the care team is available to assist with needs. Continues to work with the SW on a regular basis.  Pharmacy referral for ongoing support and education of medication needs and management. Ongoing support and education from the pharm D. Discussed plans with patient for ongoing care management follow up and provided patient with direct contact information for care management team Advised patient to discuss changes in mood, anxiety, depression, or mental health needs with provider Screening for signs and symptoms of depression related to chronic disease state  Assessed social determinant of health barriers Discussed self care and doing hobbies that make the patient happy  or going on events that do not cost money, or doing things with family and friends. Talked to the patient about [employer] as this is an online bible study group that may help also with Bible study resources.   The patient knows to call the [facility] in between outreaches for new needs or concerns.   Symptom  Management: Take medications as prescribed   Attend all scheduled provider appointments Call provider office for new concerns or questions  call the Suicide and Crisis Lifeline: 988 call the [place] National Suicide Prevention Lifeline: 385-117-7869 or TTY: (787)733-1029 TTY 406-576-2047) to talk to a trained counselor call 1-800-273-TALK (toll free, 24 hour hotline) if experiencing a Mental Health or Behavioral Health Crisis   Follow Up Plan: Telephone follow up appointment with care management team member scheduled for:11-03-2023 at 345 pm        RNCM Care Management Expected Outcome:  Monitor, Self-Manage, and Reduce Symptoms of Hypertension       Current Barriers:  Chronic Disease Management support and education needs related to effective management of HTN BP Readings from Last 3 Encounters:  08/17/23 130/70  06/08/23 100/70  03/03/23 106/68    Planned Interventions: Evaluation of current treatment plan related to hypertension self management and patient's adherence to plan as established by provider. Her blood pressures are stable and she denies any acute changes in her HTN or heart health. Will continue to monitor.    Provided education to patient re: stroke prevention, s/s of heart attack and stroke; Reviewed prescribed diet heart healthy diet. The patient is compliant with a heart healthy diet.  Reviewed medications with patient and discussed importance of compliance. The patient is compliant with medications. Works with the pharm D on an ongoing basis;  Discussed plans with patient for ongoing care management follow up and provided patient with direct contact information for care management team; Advised patient, providing education and rationale, to monitor blood pressure daily and record, calling PCP for findings outside established parameters;  Reviewed scheduled/upcoming provider appointments including: 11-24-2023 at 130 pm Advised patient to discuss changes in HTN or heart  health with provider; Provided education on prescribed diet heart healthy ;  Discussed complications of poorly controlled blood pressure such as heart disease, stroke, circulatory complications, vision complications, kidney impairment, sexual dysfunction;  Screening for signs and symptoms of depression related to chronic disease state;  Assessed social determinant of health barriers;   Symptom Management: Take medications as prescribed   Attend all scheduled provider appointments Call provider office for new concerns or questions  call the Suicide and Crisis Lifeline: 988 call the [place] National Suicide Prevention Lifeline: 319 698 7034 or TTY: 939-338-9227 TTY 732-450-3416) to talk to a trained counselor call 1-800-273-TALK (toll free, 24 hour hotline) if experiencing a Mental Health or Behavioral Health Crisis  check blood pressure weekly learn about high blood pressure call doctor for signs and symptoms of high blood pressure develop an action plan for high blood pressure keep all doctor appointments take medications for blood pressure exactly as prescribed report new symptoms to your doctor  Follow Up Plan: Telephone follow up appointment with care management team member scheduled for: 11-03-2023 at 345 pm       RNCM Care Management Maintain, Monitor and Self-Manage Symptoms of COPD       Current Barriers:  Chronic Disease Management support and education needs related to effective COPD  Planned Interventions: Provided patient with basic written and verbal COPD education on self care/management/and exacerbation prevention.  She is  happy for the cooler weather. Is not able to do a lot due to her back pain and discomfort.   Denies any acute exacerbations of her COPD.  Advised patient to track and manage COPD triggers. Is aware of things that exacerbate her condition. Provided written and verbal instructions on pursed lip breathing and utilized returned demonstration as teach  back Provided instruction about proper use of medications used for management of COPD including inhalers Advised patient to self assesses COPD action plan zone and make appointment with provider if in the yellow zone for 48 hours without improvement Advised patient to engage in light exercise as tolerated 3-5 days a week to aid in the the management of COPD. The patient is doing some things outside and wants to be more active. She deals with chronic pain and will be seeing the neurosurgeon again after her MRI for results and recommendations for her back pain and discomfort. The patient is having periods of numbness in her legs and has not had this before. She will see Dr. Cleopatra Cedar next week on 10-03-2023 for evaluation and treatment options.  Provided education about and advised patient to utilize infection prevention strategies to reduce risk of respiratory infection. The patient is mindful of her surroundings and is safe. The patient knows what worse looks like and to call for changes or needs.  Discussed the importance of adequate rest and management of fatigue with COPD Screening for signs and symptoms of depression related to chronic disease state  Assessed social determinant of health barriers   Symptom Management: Take medications as prescribed   Attend all scheduled provider appointments Call provider office for new concerns or questions  call the Suicide and Crisis Lifeline: 988 call the [place] National Suicide Prevention Lifeline: 707 536 0800 or TTY: 951-487-8993 TTY 406-234-9958) to talk to a trained counselor call 1-800-273-TALK (toll free, 24 hour hotline) if experiencing a Mental Health or Behavioral Health Crisis  identify and remove indoor air pollutants limit outdoor activity during cold weather listen for public air quality announcements every day develop a rescue plan eliminate symptom triggers at home follow rescue plan if symptoms flare-up  Follow Up Plan: Telephone  follow up appointment with care management team member scheduled for: 11-03-2023 and 345 pm           Consent to Services:  Patient was given information about care management services, agreed to services, and gave verbal consent to participate.   Plan: Telephone follow up appointment with care management team member scheduled for: 11-03-2023 at 345 pm  Alto Denver RN, MSN, CCM RN Care Manager  [facility]  Ambulatory Care Management  Direct Number: [phone]

## 2023-09-28 NOTE — Patient Instructions (Signed)
Visit Information  Thank you for taking time to visit with me today. Please don't hesitate to contact me if I can be of assistance to you before our next scheduled telephone appointment.  Following are the goals we discussed today:   Goals Addressed             This Visit's Progress    RNCM Care Management Expected Outcome:  Monitor, Self-Manage and Reduce Symptoms of: Depression, Anxiety, Stress       Current Barriers:  Care Coordination needs related to resources in the community for help with mental health needs and community resources for financial support in a patient with anxiety, depression, and stress Chronic Disease Management support and education needs related to effective management of stress, anxiety, and depression  Planned Interventions: Evaluation of current treatment plan related to stress, anxiety, and depression and patient's adherence to plan as established by provider.  Is working with the LCSW and states that has been helpful. She has made a bucket list and [facility] She says that she is interested in the socialization and events there. She has been down some recently. She is having a lot of back pain and they cannot do surgery because of her bone integrity. The patient states that she is going to Dr. Cleopatra Cedar next week and will discuss options with him. She also is happy that the test came back that she does not have RA or Lupus. Allowed the patient to talk about things and she is happy that her daughter in laws mom is doing better and she has a new great grandson. Reflective listening and support given.  Advised patient to call the office for changes in stress, anxiety, and depression Provided education to patient re: working with the CCM team, support available with resources, ongoing outreaches for effective management of stress, anxiety, and depression Reviewed medications with patient and discussed compliance. The patient is  compliant with her medications. She was able to get pap for forteo and has picked this up from the office.  The pharm D has talked with the patient. Has upcoming appointment on 10-06-2023 with the pharm D. Reviewed scheduled/upcoming provider appointments including 11-24-2023 at 130 pm Care Guide referral for resources in the community to help with food resources and financial constraints. Has been provided with information. The patient received information from the care guides but she states that none of those programs can actually help her. Will continue to monitor for changes and new programs that may be a resource for the patient.  Social Work referral for ongoing support and education related to effective management of depression, anxiety, stress. The patient has talked with the LCSW. She knows she needs to take things slow. The patient knows the care team is available to assist with needs. Continues to work with the SW on a regular basis.  Pharmacy referral for ongoing support and education of medication needs and management. Ongoing support and education from the pharm D. Discussed plans with patient for ongoing care management follow up and provided patient with direct contact information for care management team Advised patient to discuss changes in mood, anxiety, depression, or mental health needs with provider Screening for signs and symptoms of depression related to chronic disease state  Assessed social determinant of health barriers Discussed self care and doing hobbies that make the patient happy or going on events that do not cost money, or doing things with family and friends. Talked to the  patient about [employer] as this is an online bible study group that may help also with Bible study resources.   The patient knows to call the [facility] in between outreaches for new needs or concerns.   Symptom Management: Take medications as prescribed   Attend all scheduled provider  appointments Call provider office for new concerns or questions  call the Suicide and Crisis Lifeline: 988 call the [place] National Suicide Prevention Lifeline: (805) 497-9000 or TTY: 620 862 7645 TTY (731) 368-5472) to talk to a trained counselor call 1-800-273-TALK (toll free, 24 hour hotline) if experiencing a Mental Health or Behavioral Health Crisis   Follow Up Plan: Telephone follow up appointment with care management team member scheduled for:11-03-2023 at 345 pm        RNCM Care Management Expected Outcome:  Monitor, Self-Manage, and Reduce Symptoms of Hypertension       Current Barriers:  Chronic Disease Management support and education needs related to effective management of HTN BP Readings from Last 3 Encounters:  08/17/23 130/70  06/08/23 100/70  03/03/23 106/68    Planned Interventions: Evaluation of current treatment plan related to hypertension self management and patient's adherence to plan as established by provider. Her blood pressures are stable and she denies any acute changes in her HTN or heart health. Will continue to monitor.    Provided education to patient re: stroke prevention, s/s of heart attack and stroke; Reviewed prescribed diet heart healthy diet. The patient is compliant with a heart healthy diet.  Reviewed medications with patient and discussed importance of compliance. The patient is compliant with medications. Works with the pharm D on an ongoing basis;  Discussed plans with patient for ongoing care management follow up and provided patient with direct contact information for care management team; Advised patient, providing education and rationale, to monitor blood pressure daily and record, calling PCP for findings outside established parameters;  Reviewed scheduled/upcoming provider appointments including: 11-24-2023 at 130 pm Advised patient to discuss changes in HTN or heart health with provider; Provided education on prescribed diet heart healthy ;   Discussed complications of poorly controlled blood pressure such as heart disease, stroke, circulatory complications, vision complications, kidney impairment, sexual dysfunction;  Screening for signs and symptoms of depression related to chronic disease state;  Assessed social determinant of health barriers;   Symptom Management: Take medications as prescribed   Attend all scheduled provider appointments Call provider office for new concerns or questions  call the Suicide and Crisis Lifeline: 988 call the [place] National Suicide Prevention Lifeline: 540-112-0977 or TTY: 217-186-9692 TTY (256) 741-9542) to talk to a trained counselor call 1-800-273-TALK (toll free, 24 hour hotline) if experiencing a Mental Health or Behavioral Health Crisis  check blood pressure weekly learn about high blood pressure call doctor for signs and symptoms of high blood pressure develop an action plan for high blood pressure keep all doctor appointments take medications for blood pressure exactly as prescribed report new symptoms to your doctor  Follow Up Plan: Telephone follow up appointment with care management team member scheduled for: 11-03-2023 at 345 pm       RNCM Care Management Maintain, Monitor and Self-Manage Symptoms of COPD       Current Barriers:  Chronic Disease Management support and education needs related to effective COPD  Planned Interventions: Provided patient with basic written and verbal COPD education on self care/management/and exacerbation prevention.  She is happy for the cooler weather. Is not able to do a lot due to her back pain and discomfort.  Denies any acute exacerbations of her COPD.  Advised patient to track and manage COPD triggers. Is aware of things that exacerbate her condition. Provided written and verbal instructions on pursed lip breathing and utilized returned demonstration as teach back Provided instruction about proper use of medications used for management of  COPD including inhalers Advised patient to self assesses COPD action plan zone and make appointment with provider if in the yellow zone for 48 hours without improvement Advised patient to engage in light exercise as tolerated 3-5 days a week to aid in the the management of COPD. The patient is doing some things outside and wants to be more active. She deals with chronic pain and will be seeing the neurosurgeon again after her MRI for results and recommendations for her back pain and discomfort. The patient is having periods of numbness in her legs and has not had this before. She will see Dr. Cleopatra Cedar next week on 10-03-2023 for evaluation and treatment options.  Provided education about and advised patient to utilize infection prevention strategies to reduce risk of respiratory infection. The patient is mindful of her surroundings and is safe. The patient knows what worse looks like and to call for changes or needs.  Discussed the importance of adequate rest and management of fatigue with COPD Screening for signs and symptoms of depression related to chronic disease state  Assessed social determinant of health barriers   Symptom Management: Take medications as prescribed   Attend all scheduled provider appointments Call provider office for new concerns or questions  call the Suicide and Crisis Lifeline: 988 call the [place] National Suicide Prevention Lifeline: (872) 166-2667 or TTY: 581-730-3802 TTY (504)214-9065) to talk to a trained counselor call 1-800-273-TALK (toll free, 24 hour hotline) if experiencing a Mental Health or Behavioral Health Crisis  identify and remove indoor air pollutants limit outdoor activity during cold weather listen for public air quality announcements every day develop a rescue plan eliminate symptom triggers at home follow rescue plan if symptoms flare-up  Follow Up Plan: Telephone follow up appointment with care management team member scheduled for: 11-03-2023 and  345 pm           Our next appointment is by telephone on 11-03-2023 at 345 pm  Please call the care guide team at [phone] if you need to cancel or reschedule your appointment.   If you are experiencing a Mental Health or Behavioral Health Crisis or need someone to talk to, please call the Suicide and Crisis Lifeline: 988 call the [place] National Suicide Prevention Lifeline: 705-438-5272 or TTY: (727)107-0212 TTY 984-840-2429) to talk to a trained counselor call 1-800-273-TALK (toll free, 24 hour hotline) go to [facility] [address] [place] [phone])   Patient verbalizes understanding of instructions and care plan provided today and agrees to view in MyChart. Active MyChart status and patient understanding of how to access instructions and care plan via MyChart confirmed with patient.     Telephone follow up appointment with care management team member scheduled for: 11-03-2023 at 345 pm  Alto Denver RN, MSN, CCM RN Care Manager  [facility]  Ambulatory Care Management  Direct Number: [phone]

## 2023-09-29 ENCOUNTER — Other Ambulatory Visit: Payer: Self-pay | Admitting: Family Medicine

## 2023-09-29 DIAGNOSIS — C3491 Malignant neoplasm of unspecified part of right bronchus or lung: Secondary | ICD-10-CM

## 2023-09-29 DIAGNOSIS — E890 Postprocedural hypothyroidism: Secondary | ICD-10-CM

## 2023-10-03 ENCOUNTER — Encounter: Payer: Medicare HMO | Admitting: *Deleted

## 2023-10-03 DIAGNOSIS — M5416 Radiculopathy, lumbar region: Secondary | ICD-10-CM | POA: Diagnosis not present

## 2023-10-05 ENCOUNTER — Ambulatory Visit: Payer: Self-pay | Admitting: *Deleted

## 2023-10-05 NOTE — Patient Outreach (Signed)
Care Coordination   Follow Up Visit Note   10/05/2023 Name: Victoria Pena MRN: [id number] DOB: 1951/08/06  Victoria Pena is a 72 y.o. year old female who sees Cox, Fritzi Mandes, MD for primary care. I spoke with  Marko Stai Dalia by phone today.  What matters to the patients health and wellness today?  Has a new grandson- born 2 weeks early and doing well! Acknowledges depression.  Goals Addressed             This Visit's Progress    Depression minimized       Activities and task to complete in order to accomplish goals.   Enjoy your new grandson and family time! Practice good self care with healthy boundaries  Continue working on the quilt you are making! Continue Wellbutrin dose prescribed by Dr Sedalia Muta Continue to do things that you enjoy- reading, sewing, etc Continue to manage your budget/finances and seek ways to cut cost where you possibly can Continue with your walking and activity- glad you got a free Rollator walker!  Consider journaling and letter writing as we discussed today Inquire with your DSS/Medicaid office about possible eligiblity for full Medicaid (as opposed to current partial Medicaid that only pays your premium) as well as compare options for open enrollment in October Continue with your positive self care- walking,etc Attend gym.silver sneakers  `          SDOH assessments and interventions completed:  Yes     Care Coordination Interventions:  Yes, provided  Interventions Today    Flowsheet Row Most Recent Value  Chronic Disease   Chronic disease during today's visit --  [s/p epidural shot to back]  General Interventions   General Interventions Discussed/Reviewed General Interventions Discussed, Durable Medical Equipment (DME)  [pt was able to get a free rollator walker]  Durable Medical Equipment (DME) Walker  Exercise Interventions   Exercise Discussed/Reviewed Exercise Discussed, Exercise Reviewed, Physical Activity  [Pt has been walking  down the street ( ) with her rollator walker and enjoying being out and the activity. Suggested she consider options for walking somewhere and other exercise at the cooler months come so she can continue this inside.]  Physical Activity Discussed/Reviewed Physical Activity Discussed  Education Interventions   Education Provided Provided Education  Provided Verbal Education On Mental Health/Coping with Illness, Community Resources  [Pt shared she is having some vivid dreams and wonders if it is due to lack of closure she had with an ex-husband- encouraged her to journal/write letter to express those feelings as they surface- allowed her time to express her thoughts and regrets-]  Mental Health Interventions   Mental Health Discussed/Reviewed Mental Health Discussed, Other, Depression  [Pt shared she is feeling depressed- has had a lot of family in her home and ready for things to quiet down-  has a new grandson and has enjoyed time with newborn as well as the 3yo big sister who spent night with her-]  Safety Interventions   Safety Discussed/Reviewed Safety Discussed       Follow up plan: Follow up call scheduled for 11/03/23    Encounter Outcome:  Patient Visit Completed

## 2023-10-05 NOTE — Patient Instructions (Signed)
Visit Information  Thank you for taking time to visit with me today. Please don't hesitate to contact me if I can be of assistance to you.   Following are the goals we discussed today:   Goals Addressed             This Visit's Progress    Depression minimized       Activities and task to complete in order to accomplish goals.   Enjoy your new grandson and family time! Practice good self care with healthy boundaries  Continue working on the quilt you are making! Continue Wellbutrin dose prescribed by Dr Sedalia Muta Continue to do things that you enjoy- reading, sewing, etc Continue to manage your budget/finances and seek ways to cut cost where you possibly can Continue with your walking and activity- glad you got a free Rollator walker!  Consider journaling and letter writing as we discussed today Inquire with your DSS/Medicaid office about possible eligiblity for full Medicaid (as opposed to current partial Medicaid that only pays your premium) as well as compare options for open enrollment in October Continue with your positive self care- walking,etc Attend gym.silver sneakers  `          Our next appointment is by telephone on 11/03/23  Please call the care guide team at [phone] if you need to cancel or reschedule your appointment.   If you are experiencing a Mental Health or Behavioral Health Crisis or need someone to talk to, please call the Suicide and Crisis Lifeline: 988 call 911   The patient verbalized understanding of instructions, educational materials, and care plan provided today and DECLINED offer to receive copy of patient instructions, educational materials, and care plan.   Telephone follow up appointment with care management team member scheduled for: 11/03/23  Reece Levy, MSW, LCSW Clinical Social Worker [phone]

## 2023-10-06 ENCOUNTER — Other Ambulatory Visit: Payer: Self-pay | Admitting: Family Medicine

## 2023-10-06 ENCOUNTER — Other Ambulatory Visit: Payer: Self-pay | Admitting: Pharmacist

## 2023-10-06 ENCOUNTER — Ambulatory Visit (INDEPENDENT_AMBULATORY_CARE_PROVIDER_SITE_OTHER): Payer: Medicare HMO

## 2023-10-06 VITALS — Ht 65.0 in | Wt 184.0 lb

## 2023-10-06 DIAGNOSIS — Z0000 Encounter for general adult medical examination without abnormal findings: Secondary | ICD-10-CM

## 2023-10-06 NOTE — Patient Instructions (Addendum)
Victoria Pena , Thank you for taking time to come for your Medicare Wellness Visit. I appreciate your ongoing commitment to your health goals. Please review the following plan we discussed and let me know if I can assist you in the future.   Referrals/Orders/Follow-Ups/Clinician Recommendations:   This is a list of the screening recommended for you and due dates:  Health Maintenance  Topic Date Due   Hepatitis C Screening  Never done   DTaP/Tdap/Td vaccine (1 - Tdap) Never done   Zoster (Shingles) Vaccine (1 of 2) Never done   Flu Shot  07/21/2023   COVID-19 Vaccine (4 - 2023-24 season) 08/21/2023   Mammogram  12/30/2023   Medicare Annual Wellness Visit  10/05/2024   Cologuard (Stool DNA test)  06/23/2025   Pneumonia Vaccine  Completed   DEXA scan (bone density measurement)  Completed   HPV Vaccine  Aged Out   Opioid Pain Medicine Management Opioids are powerful medicines that are used to treat moderate to severe pain. When used for short periods of time, they can help you to: Sleep better. Do better in physical or occupational therapy. Feel better in the first few days after an injury. Recover from surgery. Opioids should be taken with the supervision of a trained health care provider. They should be taken for the shortest period of time possible. This is because opioids can be addictive, and the longer you take opioids, the greater your risk of addiction. This addiction can also be called opioid use disorder. What are the risks? Using opioid pain medicines for longer than 3 days increases your risk of side effects. Side effects include: Constipation. Nausea and vomiting. Breathing difficulties (respiratory depression). Drowsiness. Confusion. Opioid use disorder. Itching. Taking opioid pain medicine for a long period of time can affect your ability to do daily tasks. It also puts you at risk for: Motor vehicle crashes. Depression. Suicide. Heart attack. Overdose, which can be  life-threatening. What is a pain treatment plan? A pain treatment plan is an agreement between you and your health care provider. Pain is unique to each person, and treatments vary depending on your condition. To manage your pain, you and your health care provider need to work together. To help you do this: Discuss the goals of your treatment, including how much pain you might expect to have and how you will manage the pain. Review the risks and benefits of taking opioid medicines. Remember that a good treatment plan uses more than one approach and minimizes the chance of side effects. Be honest about the amount of medicines you take and about any drug or alcohol use. Get pain medicine prescriptions from only one health care provider. Pain can be managed with many types of alternative treatments. Ask your health care provider to refer you to one or more specialists who can help you manage pain through: Physical or occupational therapy. Counseling (cognitive behavioral therapy). Good nutrition. Biofeedback. Massage. Meditation. Non-opioid medicine. Following a gentle exercise program. How to use opioid pain medicine Taking medicine Take your pain medicine exactly as told by your health care provider. Take it only when you need it. If your pain gets less severe, you may take less than your prescribed dose if your health care provider approves. If you are not having pain, do nottake pain medicine unless your health care provider tells you to take it. If your pain is severe, do nottry to treat it yourself by taking more pills than instructed on your prescription. Contact your health  care provider for help. Write down the times when you take your pain medicine. It is easy to become confused while on pain medicine. Writing the time can help you avoid overdose. Take other over-the-counter or prescription medicines only as told by your health care provider. Keeping yourself and others safe  While  you are taking opioid pain medicine: Do not drive, use machinery, or power tools. Do not sign legal documents. Do not drink alcohol. Do not take sleeping pills. Do not supervise children by yourself. Do not do activities that require climbing or being in high places. Do not go to a lake, river, ocean, spa, or swimming pool. Do not share your pain medicine with anyone. Keep pain medicine in a locked cabinet or in a secure area where pets and children cannot reach it. Stopping your use of opioids If you have been taking opioid medicine for more than a few weeks, you may need to slowly decrease (taper) how much you take until you stop completely. Tapering your use of opioids can decrease your risk of symptoms of withdrawal, such as: Pain and cramping in the abdomen. Nausea. Sweating. Sleepiness. Restlessness. Uncontrollable shaking (tremors). Cravings for the medicine. Do not attempt to taper your use of opioids on your own. Talk with your health care provider about how to do this. Your health care provider may prescribe a step-down schedule based on how much medicine you are taking and how long you have been taking it. Getting rid of leftover pills Do not save any leftover pills. Get rid of leftover pills safely by: Taking the medicine to a prescription take-back program. This is usually offered by the county or law enforcement. Bringing them to a pharmacy that has a drug disposal container. Flushing them down the toilet. Check the label or package insert of your medicine to see whether this is safe to do. Throwing them out in the trash. Check the label or package insert of your medicine to see whether this is safe to do. If it is safe to throw it out, remove the medicine from the original container, put it into a sealable bag or container, and mix it with used coffee grounds, food scraps, dirt, or cat litter before putting it in the trash. Follow these instructions at home: Activity Do  exercises as told by your health care provider. Avoid activities that make your pain worse. Return to your normal activities as told by your health care provider. Ask your health care provider what activities are safe for you. General instructions You may need to take these actions to prevent or treat constipation: Drink enough fluid to keep your urine pale yellow. Take over-the-counter or prescription medicines. Eat foods that are high in fiber, such as beans, whole grains, and fresh fruits and vegetables. Limit foods that are high in fat and processed sugars, such as fried or sweet foods. Keep all follow-up visits. This is important. Where to find support If you have been taking opioids for a long time, you may benefit from receiving support for quitting from a local support group or counselor. Ask your health care provider for a referral to these resources in your area. Where to find more information Centers for Disease Control and Prevention (CDC): FootballExhibition.com.br [place] Food and Drug Administration (FDA): PumpkinSearch.com.ee Get help right away if: You may have taken too much of an opioid (overdosed). Common symptoms of an overdose: Your breathing is slower or more shallow than normal. You have a very slow heartbeat (pulse). You  have slurred speech. You have nausea and vomiting. Your pupils become very small. You have other potential symptoms: You are very confused. You faint or feel like you will faint. You have cold, clammy skin. You have blue lips or fingernails. You have thoughts of harming yourself or harming others. These symptoms may represent a serious problem that is an emergency. Do not wait to see if the symptoms will go away. Get medical help right away. Call your local emergency services (911 in the [place] Do not drive yourself to the hospital.  If you ever feel like you may hurt yourself or others, or have thoughts about taking your own life, get help right away. Go to your nearest  emergency department or: Call your local emergency services (911 in the [place] Call the [facility] ([phone] in the [place] Call a suicide crisis helpline, such as the National Suicide Prevention Lifeline at 228-584-4459 or 988 in the [place] This is open 24 hours a day in the [place] Text the Crisis Text Line at 323-776-2726 (in the [place] Summary Opioid medicines can help you manage moderate to severe pain for a short period of time. A pain treatment plan is an agreement between you and your health care provider. Discuss the goals of your treatment, including how much pain you might expect to have and how you will manage the pain. If you think that you or someone else may have taken too much of an opioid, get medical help right away. This information is not intended to replace advice given to you by your health care provider. Make sure you discuss any questions you have with your health care provider. Document Revised: 07/01/2021 Document Reviewed: 03/18/2021 [employer] Patient Education  2024 [employer]  Advanced directives: (Declined) Advance directive discussed with you today. Even though you declined this today, please call our office should you change your mind, and we can give you the proper paperwork for you to fill out.  Next Medicare Annual Wellness Visit scheduled for next year: Yes

## 2023-10-06 NOTE — Progress Notes (Signed)
Subjective:   Victoria Pena is a 72 y.o. female who presents for Medicare Annual (Subsequent) preventive examination.  Visit Complete: Virtual I connected with  Issabelle Mcraney Cowdrey on 10/06/23 by a audio enabled telemedicine application and verified that I am speaking with the correct person using two identifiers.  Patient Location: Home  Provider Location: Home Office  I discussed the limitations of evaluation and management by telemedicine. The patient expressed understanding and agreed to proceed.  Vital Signs: Because this visit was a virtual/telehealth visit, some criteria may be missing or patient reported. Any vitals not documented were not able to be obtained and vitals that have been documented are patient reported.  Patient Medicare AWV questionnaire was completed by the patient on 10/05/23; I have confirmed that all information answered by patient is correct and no changes since this date.  Cardiac Risk Factors include: advanced age (>46men, >45 women);hypertension     Objective:    Today's Vitals   10/05/23 2303 10/06/23 1304  Weight:  184 lb (83.5 kg)  Height:  5\' 5"  (1.651 m)  PainSc: 7     Body mass index is 30.62 kg/m.     10/06/2023    1:14 PM 04/27/2022    3:07 PM 11/06/2021    2:30 PM 01/25/2019    9:03 AM 05/11/2018    4:20 PM 05/03/2018    9:41 AM 11/23/2017   10:37 AM  Advanced Directives  Does Patient Have a Medical Advance Directive? No Yes No No No No No  Does patient want to make changes to medical advance directive?  No - Patient declined       Would patient like information on creating a medical advance directive? No - Patient declined    No - Patient declined Yes (MAU/Ambulatory/Procedural Areas - Information given)     Current Medications (verified) Outpatient Encounter Medications as of 10/06/2023  Medication Sig   ALPRAZolam (XANAX) 0.5 MG tablet TAKE 1 TABLET(0.5 MG) BY MOUTH TWICE DAILY AS NEEDED FOR ANXIETY   atorvastatin (LIPITOR) 40  MG tablet Take 1 tablet (40 mg total) by mouth daily.   buPROPion (WELLBUTRIN XL) 300 MG 24 hr tablet TAKE 1 TABLET(300 MG) BY MOUTH DAILY   Calcium Carb-Cholecalciferol (CALCIUM 500 + D3) 500-5 MG-MCG TABS Take 3 tablets by mouth daily. (Patient taking differently: Take 1 tablet by mouth in the morning and at bedtime.)   calcium carbonate (TUMS - DOSED IN MG ELEMENTAL CALCIUM) 500 MG chewable tablet Chew 1 tablet by mouth 2 (two) times daily as needed for indigestion or heartburn.   celecoxib (CELEBREX) 200 MG capsule Take 1 capsule (200 mg total) by mouth daily.   EPINEPHrine 0.3 mg/0.3 mL IJ SOAJ injection Inject 0.3 mg into the muscle as needed for anaphylaxis.   escitalopram (LEXAPRO) 10 MG tablet Take 1 tablet (10 mg total) by mouth daily.   famotidine (PEPCID) 40 MG tablet Take 1 tablet (40 mg total) by mouth daily.   fluticasone (FLONASE) 50 MCG/ACT nasal spray Place 2 sprays into both nostrils daily. (Patient taking differently: Place 2 sprays into both nostrils at bedtime.)   FORTEO 600 MCG/2.4ML SOPN Inject 20 mcg into the skin daily.   Insulin Pen Needle 32G X 4 MM MISC 1 each by Does not apply route daily.   levothyroxine (SYNTHROID) 125 MCG tablet TAKE 1 TABLET BY MOUTH DAILY BEFORE BREAKFAST   losartan (COZAAR) 50 MG tablet Take 1 tablet (50 mg total) by mouth daily.   metroNIDAZOLE (METROGEL)  0.75 % gel Apply 1 application topically 2 (two) times daily. (Patient taking differently: Apply 1 application  topically 2 (two) times daily as needed (rosacea).)   Multiple Vitamin (MULTIVITAMIN WITH MINERALS) TABS tablet Take 1 tablet by mouth in the morning and at bedtime.   mupirocin ointment (BACTROBAN) 2 % Apply 1 Application topically 2 (two) times daily.   Oxycodone HCl 10 MG TABS Take 1 tablet (10 mg total) by mouth 4 (four) times daily as needed (severe pain).   pantoprazole (PROTONIX) 40 MG tablet Take 1 tablet (40 mg total) by mouth daily.   pregabalin (LYRICA) 150 MG capsule TAKE  1 CAPSULE(150 MG) BY MOUTH TWICE DAILY   trolamine salicylate (ASPERCREME) 10 % cream Apply 1 Application topically as needed for muscle pain.   Vitamin D, Ergocalciferol, 50000 units CAPS Take 1 capsule by mouth 2 (two) times a week.   No facility-administered encounter medications on file as of 10/06/2023.    Allergies (verified) Keflex [cephalexin], Robaxin [methocarbamol], Hydrocodone-acetaminophen, Hydroxyzine, Prednisone & diphenhydramine, Amlodipine, Ciprofloxacin, Dextromethorphan polistirex er, Nitrofurantoin, Pravastatin, Diphenhydramine hcl, Moxifloxacin, and Prednisone   History: Past Medical History:  Diagnosis Date   Acquired spondylolisthesis 05/12/2015   Adenocarcinoma of right lung, stage 1 (HCC) 07/26/2016   Anxiety    Arthritis    Arthropathy of lumbar facet joint 04/11/2019   Formatting of this note might be different from the original. Added automatically from request for surgery [id number]   Atherosclerosis of aorta (HCC) 07/08/2021   Benign essential hypertension 12/27/2016   BMI 30.0-30.9,adult 12/23/2020   COPD (chronic obstructive pulmonary disease) (HCC)    pt denies   Current mild episode of major depressive disorder (HCC) 03/24/2020   Daytime sleepiness 08/03/2021   Depression    Fibromyalgia 03/24/2020   Gastroesophageal reflux disease without esophagitis 03/24/2020   Graves disease 12/27/2016   Hypertension    Osteoporosis 08/03/2021   Pericardial cyst 02/02/2021   Postoperative hypothyroidism 01/17/2015   Reactive airways dysfunction syndrome, unspecified asthma severity, uncomplicated (HCC) 09/06/2015   S/P lobectomy of lung 07/26/2015   Formatting of this note might be different from the original. Right upper   S/P lumbar spinal fusion 05/11/2018   Thyroid disease    Toe ulcer, left, limited to breakdown of skin (HCC) 12/23/2020   Past Surgical History:  Procedure Laterality Date   ANKLE SURGERY     bilateral   APPENDECTOMY     BREAST BIOPSY  Left 02/25/2020   BREAST BIOPSY Right 08/17/2018   BREAST EXCISIONAL BIOPSY Right    unsure when but marked with scar marker   CHOLECYSTECTOMY     EYE MUSCLE SURGERY Bilateral    4-5 years ago   LAMINECTOMY WITH POSTERIOR LATERAL ARTHRODESIS LEVEL 2 N/A 01/21/2023   Procedure: Laminectomy  - L1-L2 - L2-L3 with non-instrumented posterior lateral fusion;  Surgeon: Tia Alert, MD;  Location: [facility] OR;  Service: Neurosurgery;  Laterality: N/A;   LUMBAR FUSION  2019   L4, 5, 6   REPLACEMENT TOTAL KNEE Left    THYROIDECTOMY     Family History  Problem Relation Age of Onset   Cancer Mother    Cancer Father    Cancer Brother    Autism Grandson    Breast cancer Neg Hx    Social History   Socioeconomic History   Marital status: Widowed    Spouse name: Not on file   Number of children: Not on file   Years of education: Not on file   Highest  education level: GED or equivalent  Occupational History   Not on file  Tobacco Use   Smoking status: Former    Current packs/day: 0.00    Average packs/day: 1.5 packs/day for 35.0 years (52.5 ttl pk-yrs)    Types: Cigarettes    Start date: 07/26/1964    Quit date: 07/27/1999    Years since quitting: 24.2   Smokeless tobacco: Never  Vaping Use   Vaping status: Never Used  Substance and Sexual Activity   Alcohol use: No   Drug use: No   Sexual activity: Not Currently  Other Topics Concern   Not on file  Social History Narrative   Not on file   Social Determinants of Health   Financial Resource Strain: High Risk (10/05/2023)   Overall Financial Resource Strain (CARDIA)    Difficulty of Paying Living Expenses: Hard  Food Insecurity: Food Insecurity Present (10/05/2023)   Hunger Vital Sign    Worried About Running Out of Food in the Last Year: Often true    Ran Out of Food in the Last Year: Often true  Transportation Needs: No Transportation Needs (10/05/2023)   PRAPARE - Administrator, Civil Service (Medical): No    Lack of  Transportation (Non-Medical): No  Physical Activity: Insufficiently Active (10/05/2023)   Exercise Vital Sign    Days of Exercise per Week: 2 days    Minutes of Exercise per Session: 30 min  Stress: Stress Concern Present (10/05/2023)   [employer] of Occupational Health - Occupational Stress Questionnaire    Feeling of Stress : Rather much  Social Connections: Socially Isolated (10/05/2023)   Social Connection and Isolation Panel [NHANES]    Frequency of Communication with Friends and Family: More than three times a week    Frequency of Social Gatherings with Friends and Family: Once a week    Attends Religious Services: Never    Database administrator or Organizations: No    Attends Banker Meetings: Never    Marital Status: Widowed    Tobacco Counseling Counseling given: Not Answered   Clinical Intake:  Pre-visit preparation completed: Yes  Pain : 0-10 Pain Score: 7  Pain Type: Chronic pain Pain Location: Back Pain Orientation: Lower Pain Radiating Towards: Rt Hip Pain Descriptors / Indicators: Constant Pain Onset: More than a month ago Pain Frequency: Constant Pain Relieving Factors: Rx: Meds Effect of Pain on Daily Activities: Effects Daiy Activities  Pain Relieving Factors: Rx: Meds  BMI - recorded: 30.62 Nutritional Status: BMI > 30  Obese Nutritional Risks: None Diabetes: No  How often do you need to have someone help you when you read instructions, pamphlets, or other written materials from your doctor or pharmacy?: 1 - Never  Interpreter Needed?: No  Information entered by :: Theresa Mulligan LPN   Activities of Daily Living    10/05/2023   11:03 PM 12/31/2022    1:16 PM  In your present state of health, do you have any difficulty performing the following activities:  Hearing? 0 0  Vision? 0 0  Difficulty concentrating or making decisions? 0 0  Walking or climbing stairs? 1 1  Comment Uses a Arts development officer or  bathing? 0 0  Doing errands, shopping? 0   Preparing Food and eating ? N   Using the Toilet? N   In the past six months, have you accidently leaked urine? N   Do you have problems with loss of bowel control? N  Managing your Medications? N   Managing your Finances? N   Housekeeping or managing your Housekeeping? N     Patient Care Team: Blane Ohara, MD as PCP - General (Family Medicine) Buck Mam, LCSW as Social Worker Marlowe Sax, RN as Case Manager (General Practice)  Indicate any recent Medical Services you may have received from other than [facility] providers in the past year (date may be approximate).     Assessment:   This is a routine wellness examination for Victoria Pena.  Hearing/Vision screen Hearing Screening - Comments:: Denies hearing difficulties   Vision Screening - Comments:: Wears rx glasses - up to date with routine eye exams with  [facility]   Goals Addressed               This Visit's Progress     Increase physical activity (pt-stated)         Depression Screen    10/06/2023    1:21 PM 08/17/2023    8:37 AM 06/08/2023    3:20 PM 03/01/2023    3:00 PM 12/30/2022    1:31 PM 12/29/2022    1:02 PM 12/16/2022    2:34 PM  PHQ 2/9 Scores  PHQ - 2 Score 4 4 6 1 2 1 4   PHQ- 9 Score 12 12 17 3 10 3 13     Fall Risk    10/06/2023    1:29 PM 10/05/2023   11:03 PM 08/25/2023    4:00 PM 08/17/2023    8:37 AM 07/12/2023    3:58 PM  Fall Risk   Falls in the past year? 0 0 0 0 0  Number falls in past yr: 0 0 0 0 0  Injury with Fall? 0 0 0 0 0  Risk for fall due to : No Fall Risks No Fall Risks No Fall Risks No Fall Risks Impaired mobility;Impaired balance/gait  Follow up Falls prevention discussed Falls prevention discussed Falls evaluation completed;Education provided;Falls prevention discussed Falls evaluation completed Falls evaluation completed;Education provided;Falls prevention discussed    MEDICARE RISK AT HOME: Medicare Risk at Home Any  stairs in or around the home?: No If so, are there any without handrails?: No Home free of loose throw rugs in walkways, pet beds, electrical cords, etc?: Yes Adequate lighting in your home to reduce risk of falls?: Yes Life alert?: No Use of a cane, walker or w/c?: No Grab bars in the bathroom?: Yes Shower chair or bench in shower?: Yes Elevated toilet seat or a handicapped toilet?: Yes  TIMED UP AND GO:  Was the test performed?  No    Cognitive Function:        10/06/2023    1:14 PM 04/27/2022    3:10 PM  6CIT Screen  What Year? 0 points 0 points  What month? 0 points 0 points  What time? 0 points 0 points  Count back from 20 0 points 0 points  Months in reverse 0 points 0 points  Repeat phrase 0 points 0 points  Total Score 0 points 0 points    Immunizations Immunization History  Administered Date(s) Administered   Fluad Quad(high Dose 65+) 09/23/2020, 10/27/2021, 10/12/2022   Influenza-Unspecified 08/25/2018   Moderna SARS-COV2 Booster Vaccination 02/10/2021   Moderna Sars-Covid-2 Vaccination 01/14/2020, 03/03/2020   PNEUMOCOCCAL CONJUGATE-20 10/26/2022   Pfizer(Comirnaty)Fall Seasonal Vaccine 12 years and older 10/26/2022   Pneumococcal Polysaccharide-23 09/23/2020    TDAP status: Due, Education has been provided regarding the importance of this vaccine. Advised may  receive this vaccine at local pharmacy or Health Dept. Aware to provide a copy of the vaccination record if obtained from local pharmacy or Health Dept. Verbalized acceptance and understanding.  Flu Vaccine status: Due, Education has been provided regarding the importance of this vaccine. Advised may receive this vaccine at local pharmacy or Health Dept. Aware to provide a copy of the vaccination record if obtained from local pharmacy or Health Dept. Verbalized acceptance and understanding.  Pneumococcal vaccine status: Up to date  Covid-19 vaccine status: Declined, Education has been provided regarding  the importance of this vaccine but patient still declined. Advised may receive this vaccine at local pharmacy or Health Dept.or vaccine clinic. Aware to provide a copy of the vaccination record if obtained from local pharmacy or Health Dept. Verbalized acceptance and understanding.  Qualifies for Shingles Vaccine? Yes   Zostavax completed No   Shingrix Completed?: No.    Education has been provided regarding the importance of this vaccine. Patient has been advised to call insurance company to determine out of pocket expense if they have not yet received this vaccine. Advised may also receive vaccine at local pharmacy or Health Dept. Verbalized acceptance and understanding.  Screening Tests Health Maintenance  Topic Date Due   Hepatitis C Screening  Never done   DTaP/Tdap/Td (1 - Tdap) Never done   Zoster Vaccines- Shingrix (1 of 2) Never done   INFLUENZA VACCINE  07/21/2023   COVID-19 Vaccine (4 - 2023-24 season) 08/21/2023   MAMMOGRAM  12/30/2023   Medicare Annual Wellness (AWV)  10/05/2024   Fecal DNA (Cologuard)  06/23/2025   Pneumonia Vaccine 65+ Years old  Completed   DEXA SCAN  Completed   HPV VACCINES  Aged Out    Health Maintenance  Health Maintenance Due  Topic Date Due   Hepatitis C Screening  Never done   DTaP/Tdap/Td (1 - Tdap) Never done   Zoster Vaccines- Shingrix (1 of 2) Never done   INFLUENZA VACCINE  07/21/2023   COVID-19 Vaccine (4 - 2023-24 season) 08/21/2023    Colorectal cancer screening: Type of screening: Cologuard. Completed 05/24/22. Repeat every 3 years  Mammogram status: Completed 12/29/22. Repeat every year  Bone Density status: Completed 08/03/23. Results reflect: Bone density results: OSTEOPOROSIS. Repeat every   years.    Additional Screening:  Hepatitis C Screening: does qualify; Deferred  Vision Screening: Recommended annual ophthalmology exams for early detection of glaucoma and other disorders of the eye. Is the patient up to date with  their annual eye exam?  Yes  Who is the provider or what is the name of the office in which the patient attends annual eye exams? [facility] If pt is not established with a provider, would they like to be referred to a provider to establish care? No .   Dental Screening: Recommended annual dental exams for proper oral hygiene    Community Resource Referral / Chronic Care Management:  CRR required this visit?  No   CCM required this visit?  No     Plan:     I have personally reviewed and noted the following in the patient's chart:   Medical and social history Use of alcohol, tobacco or illicit drugs  Current medications and supplements including opioid prescriptions. Patient is currently taking opioid prescriptions. Information provided to patient regarding non-opioid alternatives. Patient advised to discuss non-opioid treatment plan with their provider. Functional ability and status Nutritional status Physical activity Advanced directives List of other physicians Hospitalizations, surgeries, and  ER visits in previous 12 months Vitals Screenings to include cognitive, depression, and falls Referrals and appointments  In addition, I have reviewed and discussed with patient certain preventive protocols, quality metrics, and best practice recommendations. A written personalized care plan for preventive services as well as general preventive health recommendations were provided to patient.     Tillie Rung, LPN   16/09/9603   After Visit Summary: (MyChart) Due to this being a telephonic visit, the after visit summary with patients personalized plan was offered to patient via MyChart   Nurse Notes: None

## 2023-10-06 NOTE — Progress Notes (Signed)
10/06/2023 Name: [name] MRN: [id number] DOB: 1951-09-20   Victoria Pena is a 72 y.o. year old female who presented for a telephone visit.   They were referred to the pharmacist by their PCP for assistance in managing medication access - for forteo.   Patient was originally on prolia, provider desired evenity. No cost reduction options exist for medicare for evenity, therefore recommended alternative of forteo to PCP for bone building for 2 years, then back to prolia. Patient has since gotten one 30DS of forteo at no cost through [employer]. She also has approved patient assistance via [employer] from a prior enrollment earlier this year. Refills for the program were reactivated by our rx med assistance team. She has picked them up successfully at PCP office.     Subjective:  Care Team: Primary Care Provider: Blane Ohara, MD  Medication Access/Adherence  Current Pharmacy:  [facility] [id number] Rosalita Levan,  - [address] AT [facility] [address] [place] [address] Phone: [phone] Fax: [phone]  [facility] - [place], [place] - [address] [place] [address] Phone: [phone] Fax: [phone]   Patient reports affordability concerns with their medications: No  Patient reports access/transportation concerns to their pharmacy: No  Patient reports adherence concerns with their medications:  No     Osteoporosis:  Current medications: forteo SQ daily Medications tried in the past: prolia 60mg  every 6 months, last injection June 2024  Current supplements: Calcium gummies + vitamin D 50,000 unit twice weekly   Most recent DEXA: 08/03/23, T score 3.7  Current medication access support: [employer] PAP   Objective:     Medications Reviewed Today     Reviewed by Gabriel Carina, [facility] (Pharmacist) on 10/06/23 at 1806  Med List Status: <None>    Medication Order Taking? Sig Documenting Provider Last Dose Status Informant  ALPRAZolam (XANAX) 0.5 MG tablet [id number]  TAKE 1 TABLET(0.5 MG) BY MOUTH TWICE DAILY AS NEEDED FOR ANXIETY Cox, Kirsten, MD  Active   atorvastatin (LIPITOR) 40 MG tablet [id number]  TAKE 1 TABLET(40 MG) BY MOUTH DAILY Cox, Kirsten, MD  Active   buPROPion (WELLBUTRIN XL) 300 MG 24 hr tablet [id number]  TAKE 1 TABLET(300 MG) BY MOUTH DAILY Cox, Kirsten, MD  Active   Calcium Carb-Cholecalciferol (CALCIUM 500 + D3) 500-5 MG-MCG TABS [id number]  Take 3 tablets by mouth daily.  Patient taking differently: Take 1 tablet by mouth in the morning and at bedtime.   Abigail Miyamoto, MD  Active Self  calcium carbonate (TUMS - DOSED IN MG ELEMENTAL CALCIUM) 500 MG chewable tablet [id number]  Chew 1 tablet by mouth 2 (two) times daily as needed for indigestion or heartburn. [provider]  Active Self  celecoxib (CELEBREX) 200 MG capsule [id number]  Take 1 capsule (200 mg total) by mouth daily. Blane Ohara, MD  Active            Med Note [place] Derry Aug 24, 2023 11:14 AM) Will pick this up and start soon, as of 08/24/23 review  EPINEPHrine 0.3 mg/0.3 mL IJ SOAJ injection [id number]  Inject 0.3 mg into the muscle as needed for anaphylaxis. Janie Morning, NP  Active Self  escitalopram (LEXAPRO) 10 MG tablet [id number]  Take 1 tablet (10 mg total) by mouth daily. Blane Ohara, MD  Active   famotidine (PEPCID) 40 MG tablet [id number]  Take 1 tablet (40 mg total) by mouth daily. Cox, Kirsten, MD  Active   fluticasone [facility]) 50 MCG/ACT nasal spray [id number]  Place 2 sprays into both nostrils daily.  Patient taking differently: Place 2 sprays into both nostrils at bedtime.   Janie Morning, NP  Active Self  FORTEO 600 MCG/2.4ML SOPN [id number] Yes Inject 20 mcg into the skin daily. [provider] Taking Active   Insulin Pen Needle 32G X 4 MM MISC [id number]  1 each by Does not apply route daily. Cox,  Kirsten, MD  Active   levothyroxine (SYNTHROID) 125 MCG tablet [id number]  TAKE 1 TABLET BY MOUTH DAILY BEFORE BREAKFAST Cox, Kirsten, MD  Active   losartan (COZAAR) 50 MG tablet [id number]  Take 1 tablet (50 mg total) by mouth daily. Cox, Kirsten, MD  Active   metroNIDAZOLE (METROGEL) 0.75 % gel [id number]  Apply 1 application topically 2 (two) times daily.  Patient taking differently: Apply 1 application  topically 2 (two) times daily as needed (rosacea).   Abigail Miyamoto, MD  Active Self  Multiple Vitamin (MULTIVITAMIN WITH MINERALS) TABS tablet [id number]  Take 1 tablet by mouth in the morning and at bedtime. [provider]  Active Self  mupirocin ointment (BACTROBAN) 2 % [id number]  Apply 1 Application topically 2 (two) times daily. Abigail Miyamoto, MD  Active Self  Oxycodone HCl 10 MG TABS [id number]  Take 1 tablet (10 mg total) by mouth 4 (four) times daily as needed (severe pain). Cox, Kirsten, MD  Active   pantoprazole (PROTONIX) 40 MG tablet [id number]  Take 1 tablet (40 mg total) by mouth daily. Cox, Kirsten, MD  Active   pregabalin (LYRICA) 150 MG capsule [id number]  TAKE 1 CAPSULE(150 MG) BY MOUTH TWICE DAILY Cox, Kirsten, MD  Active   trolamine salicylate (ASPERCREME) 10 % cream [id number]  Apply 1 Application topically as needed for muscle pain. [provider]  Active Self  Vitamin D, Ergocalciferol, 50000 units CAPS [id number]  Take 1 capsule by mouth 2 (two) times a week. CoxFritzi Mandes, MD  Active               Assessment/Plan:   Osteoporosis: - Currently appropriately managed - Recommend to continue current regimen  -Will facilitate rx med assistance team to send forteo patient assistance application for 2025 renewal, add pt to spreadsheet for application progress  Follow Up Plan:no follow up scheduled, pharmacy team to outreach when appropriate for 2025 patient assistance application processing  Lynnda Shields, PharmD, BCPS Clinical  Pharmacist [facility]

## 2023-10-18 ENCOUNTER — Other Ambulatory Visit: Payer: Self-pay

## 2023-10-18 DIAGNOSIS — G8929 Other chronic pain: Secondary | ICD-10-CM

## 2023-10-18 MED ORDER — OXYCODONE HCL 10 MG PO TABS: 10.0000 mg | ORAL_TABLET | Freq: Four times a day (QID) | ORAL | 0 refills | Status: DC | PRN

## 2023-10-25 ENCOUNTER — Ambulatory Visit: Payer: Self-pay | Admitting: *Deleted

## 2023-10-26 ENCOUNTER — Telehealth: Payer: Self-pay

## 2023-10-26 NOTE — Patient Instructions (Signed)
Visit Information  Thank you for taking time to visit with me today. Please don't hesitate to contact me if I can be of assistance to you.   Following are the goals we discussed today:   Goals Addressed               This Visit's Progress     Patient Stated she feels overwhelmed sometimes and has depression issues. She has challenges with meeting daily care needs (pt-stated)        Activities and task to complete in order to accomplish goals.   Review your new [employer] Dual plan benefits- great that it will provide you healthy foods/card as well as dental, vision and more! Keep journaling, sewing, reading, walking (incorporate daughter joining) and other good self care activities Continue with compliance of taking medication prescribed by Doctor- alert them if stomach issues continue  Continue with your activities including sewing, quilting, gardening, reading, daily walking,etc Consider seeking opportunities to connect with others through church, [employer], [facility], etc Continue with journaling and/or the workbook mentioned for self growth/forgiveness  Reach out to friends/neighbors for connecting and doing things together- save on gas money by carpooling  Consider [employer] transport  Review the [facility] [facility] calendar and consider the classes- sewing?          Our next appointment is by telephone on 11/15/23  Please call the care guide team at [phone] if you need to cancel or reschedule your appointment.   If you are experiencing a Mental Health or Behavioral Health Crisis or need someone to talk to, please call the Suicide and Crisis Lifeline: 988 call 911   The patient verbalized understanding of instructions, educational materials, and care plan provided today and DECLINED offer to receive copy of patient instructions, educational materials, and care plan.   Telephone follow up appointment with care management team member scheduled  for: 11/15/23  Reece Levy, MSW, LCSW [place]  [facility], [facility] Licensed Clinical Social Worker Care Coordinator  [phone]

## 2023-10-26 NOTE — Patient Outreach (Signed)
  Care Coordination   Follow Up Visit Note  Late Entry 10/25/23 10/26/2023 Name: Victoria Pena MRN: [id number] DOB: 06-Apr-1951  Victoria Pena is a 72 y.o. year old female who sees Cox, Fritzi Mandes, MD for primary care. I spoke with  Marko Stai Minney by phone today.  What matters to the patients health and wellness today?  Doing ok    Goals Addressed               This Visit's Progress     Patient Stated she feels overwhelmed sometimes and has depression issues. She has challenges with meeting daily care needs (pt-stated)        Activities and task to complete in order to accomplish goals.   Review your new [employer] Dual plan benefits- great that it will provide you healthy foods/card as well as dental, vision and more! Keep journaling, sewing, reading, walking (incorporate daughter joining) and other good self care activities Continue with compliance of taking medication prescribed by Doctor- alert them if stomach issues continue  Continue with your activities including sewing, quilting, gardening, reading, daily walking,etc Consider seeking opportunities to connect with others through church, [employer], [employer] [facility], etc Continue with journaling and/or the workbook mentioned for self growth/forgiveness  Reach out to friends/neighbors for connecting and doing things together- save on gas money by carpooling  Consider [employer] transport  Review the [facility] calendar and consider the classes- sewing?          SDOH assessments and interventions completed:  Yes     Care Coordination Interventions:  Yes, provided  Interventions Today    Flowsheet Row Most Recent Value  Chronic Disease   Chronic disease during today's visit Other  [depression]  General Interventions   General Interventions Discussed/Reviewed [employer], General Interventions Discussed  Exercise Interventions   Exercise Discussed/Reviewed Exercise Discussed  [Pt reports she has  been walking with her rollator daily-]  Physical Activity Discussed/Reviewed Physical Activity Discussed  Education Interventions   Education Provided Provided Education, Provided Web-based Education  Provided Verbal Education On --  [Pt feels her vivid dreams of ex-husband are not an issue now]  Mental Health Interventions   Mental Health Discussed/Reviewed Mental Health Discussed, Depression, Mental Health Reviewed, Coping Strategies  [Pt is coping overall well- neighbor/friend passed away recently and pt expressed her grief and time she was able to spend prior to passing which gave her some closure]  Nutrition Interventions   Nutrition Discussed/Reviewed Nutrition Discussed  [Pt is now receiving a $250 card from insurance to help with costs of healthy foods, etc]       Follow up plan: Follow up call scheduled for 11/15/23    Encounter Outcome:  Patient Visit Completed

## 2023-10-26 NOTE — Telephone Encounter (Signed)
-----   Message from Gabriel Carina sent at 10/06/2023  6:07 PM EDT ----- Can you please send pt 2025 renewal application for forteo patient assistance? Thank you, Thurston Hole

## 2023-10-26 NOTE — Telephone Encounter (Signed)
PAP: PAP application for FORTEO, [employer]) has been mailed to pt's home address on file. Will fax provider portion of application to provider's office when pt's portion is received.   PLEASE BE ADVISED RENEWAL APP FOR 2025

## 2023-10-27 DIAGNOSIS — M47816 Spondylosis without myelopathy or radiculopathy, lumbar region: Secondary | ICD-10-CM | POA: Diagnosis not present

## 2023-11-03 ENCOUNTER — Ambulatory Visit (INDEPENDENT_AMBULATORY_CARE_PROVIDER_SITE_OTHER): Payer: Medicare HMO

## 2023-11-03 ENCOUNTER — Other Ambulatory Visit: Payer: Self-pay

## 2023-11-03 DIAGNOSIS — Z23 Encounter for immunization: Secondary | ICD-10-CM

## 2023-11-03 NOTE — Patient Instructions (Signed)
Visit Information  Thank you for taking time to visit with me today. Please don't hesitate to contact me if I can be of assistance to you before our next scheduled telephone appointment.   Our next appointment is by telephone on 11-04-2023 at 345 pm  Please call the care guide team at [phone] if you need to cancel or reschedule your appointment.   If you are experiencing a Mental Health or Behavioral Health Crisis or need someone to talk to, please call the Suicide and Crisis Lifeline: 988 call the [place] National Suicide Prevention Lifeline: (704)717-6016 or TTY: 332-479-5299 TTY 934-593-6684) to talk to a trained counselor call 1-800-273-TALK (toll free, 24 hour hotline) go to [facility] [address] [place] [phone])   Patient verbalizes understanding of instructions and care plan provided today and agrees to view in MyChart. Active MyChart status and patient understanding of how to access instructions and care plan via MyChart confirmed with patient.       Alto Denver RN, MSN, CCM RN Care Manager  [facility]  Ambulatory Care Management  Direct Number: [phone]

## 2023-11-03 NOTE — Patient Outreach (Signed)
  Care Management   Visit Note  11/03/2023 Name: Victoria Pena MRN: [id number] DOB: Jun 13, 1951  Subjective: Victoria Pena is a 72 y.o. year old female who is a primary care patient of Cox, Kirsten, MD. The Care Management team was consulted for assistance.      Engaged with patient spoke with patient by telephone.   Incoming call from the patient asking for her appointment to be changed to tomorrow. She has an appointment this afternoon. Moved patients appointment to tomorrow.  The patient did ask about assistance with a bill for medications that has been turned over to collections. The patient instructed to take the bill with her to the office today and talk to the office manager. The patient states this is for medications she received when she was on the patient assistance program. Education and support provided. Will continue to monitor.    Consent to Services:  Patient was given information about care management services, agreed to services, and gave verbal consent to participate.   Plan: Telephone follow up appointment with care management team member scheduled for: 11-04-2023 at 345 pm  Alto Denver RN, MSN, CCM RN Care Manager  [facility]  Ambulatory Care Management  Direct Number: [phone]

## 2023-11-04 ENCOUNTER — Other Ambulatory Visit: Payer: Self-pay

## 2023-11-04 NOTE — Patient Outreach (Signed)
Care Management   Visit Note  11/04/2023 Name: Victoria Pena MRN: [id number] DOB: Aug 28, 1951  Subjective: Victoria Pena is a 72 y.o. year old female who is a primary care patient of Cox, Kirsten, MD. The Care Management team was consulted for assistance.      Engaged with patient spoke with patient by telephone.    Goals Addressed             This Visit's Progress    RNCM Care Management Expected Outcome:  Monitor, Self-Manage and Reduce Symptoms of: Depression, Anxiety, Stress       Current Barriers:  Care Coordination needs related to resources in the community for help with mental health needs and community resources for financial support in a patient with anxiety, depression, and stress Chronic Disease Management support and education needs related to effective management of stress, anxiety, and depression  Planned Interventions: Evaluation of current treatment plan related to stress, anxiety, and depression and patient's adherence to plan as established by provider.  Is working with the LCSW and states that has been helpful. She has made a bucket list and she also [facility] the [facility] She says that she is interested in the socialization and events there. Her neighbor passed away since last outreach and this has been hard on her. He was a good friend. She reflected on how good a friend and neighbor he was and how she is making sure she keeps a check on his wife. She also is helping take care of her ex husband right now. Her son is on his way to get him now. She says that she is glad she can help take care of him when needed. She says that she wants to get back to journal writing but has not been able to yet. Education and support given. Reflective listening.  Advised patient to call the office for changes in stress, anxiety, and depression Provided education to patient re: working with the CCM team, support available with resources, ongoing outreaches for  effective management of stress, anxiety, and depression Reviewed medications with patient and discussed compliance. The patient is compliant with her medications. She was able to get pap for forteo and has picked this up from the office.  The pharm D has talked with the patient.  Reviewed scheduled/upcoming provider appointments including 11-24-2023 at 130 pm with the pcp Care Guide referral for resources in the community to help with food resources and financial constraints. Has been provided with information. The patient received information from the care guides but she states that none of those programs can actually help her. Will continue to monitor for changes and new programs that may be a resource for the patient.  Social Work referral for ongoing support and education related to effective management of depression, anxiety, stress. The patient has talked with the LCSW. She knows she needs to take things slow. The patient knows the care team is available to assist with needs. Continues to work with the SW on a regular basis.  Pharmacy referral for ongoing support and education of medication needs and management. Ongoing support and education from the pharm D. Discussed plans with patient for ongoing care management follow up and provided patient with direct contact information for care management team Advised patient to discuss changes in mood, anxiety, depression, or mental health needs with provider Screening for signs and symptoms of depression related to chronic disease state  Assessed social determinant of health barriers Discussed self care  and doing hobbies that make the patient happy or going on events that do not cost money, or doing things with family and friends. Talked to the patient about [employer] as this is an online bible study group that may help also with Bible study resources.   The patient knows to call the [facility] in between outreaches for new needs or concerns.   Symptom  Management: Take medications as prescribed   Attend all scheduled provider appointments Call provider office for new concerns or questions  call the Suicide and Crisis Lifeline: 988 call the [place] National Suicide Prevention Lifeline: 8782954855 or TTY: (206) 087-8562 TTY 9806325902) to talk to a trained counselor call 1-800-273-TALK (toll free, 24 hour hotline) if experiencing a Mental Health or Behavioral Health Crisis   Follow Up Plan: Telephone follow up appointment with care management team member scheduled for:12-29-2023 at 345 pm        RNCM Care Management Expected Outcome:  Monitor, Self-Manage, and Reduce Symptoms of Hypertension       Current Barriers:  Chronic Disease Management support and education needs related to effective management of HTN BP Readings from Last 3 Encounters:  08/17/23 130/70  06/08/23 100/70  03/03/23 106/68    Planned Interventions: Evaluation of current treatment plan related to hypertension self management and patient's adherence to plan as established by provider. Her blood pressures are stable and she denies any acute changes in her HTN or heart health. Will continue to monitor.    Provided education to patient re: stroke prevention, s/s of heart attack and stroke; Reviewed prescribed diet heart healthy diet. The patient is compliant with a heart healthy diet.  Reviewed medications with patient and discussed importance of compliance. The patient is compliant with medications. Works with the pharm D on an ongoing basis;  Discussed plans with patient for ongoing care management follow up and provided patient with direct contact information for care management team; Advised patient, providing education and rationale, to monitor blood pressure daily and record, calling PCP for findings outside established parameters;  Reviewed scheduled/upcoming provider appointments including: 11-24-2023 at 130 pm Advised patient to discuss changes in HTN or heart  health with provider; Provided education on prescribed diet heart healthy ;  Discussed complications of poorly controlled blood pressure such as heart disease, stroke, circulatory complications, vision complications, kidney impairment, sexual dysfunction;  Screening for signs and symptoms of depression related to chronic disease state;  Assessed social determinant of health barriers;   Symptom Management: Take medications as prescribed   Attend all scheduled provider appointments Call provider office for new concerns or questions  call the Suicide and Crisis Lifeline: 988 call the [place] National Suicide Prevention Lifeline: 608-579-2197 or TTY: 587-800-3134 TTY 539-285-5324) to talk to a trained counselor call 1-800-273-TALK (toll free, 24 hour hotline) if experiencing a Mental Health or Behavioral Health Crisis  check blood pressure weekly learn about high blood pressure call doctor for signs and symptoms of high blood pressure develop an action plan for high blood pressure keep all doctor appointments take medications for blood pressure exactly as prescribed report new symptoms to your doctor  Follow Up Plan: Telephone follow up appointment with care management team member scheduled for: 12-29-2023 at 345 pm       RNCM Care Management Maintain, Monitor and Self-Manage Symptoms of COPD       Current Barriers:  Chronic Disease Management support and education needs related to effective COPD  Planned Interventions: Provided patient with basic written and verbal COPD education  on self care/management/and exacerbation prevention.  She is happy for the cooler weather. Is not able to do a lot due to her back pain and discomfort.   Denies any acute exacerbations of her COPD.  Advised patient to track and manage COPD triggers. Is aware of things that exacerbate her condition. Provided written and verbal instructions on pursed lip breathing and utilized returned demonstration as teach  back Provided instruction about proper use of medications used for management of COPD including inhalers Advised patient to self assesses COPD action plan zone and make appointment with provider if in the yellow zone for 48 hours without improvement Advised patient to engage in light exercise as tolerated 3-5 days a week to aid in the the management of COPD. The patient is doing some things outside and wants to be more active. She deals with chronic pain and will be seeing the neurosurgeon again after her MRI for results and recommendations for her back pain and discomfort. The patient is having periods of numbness in her legs and has not had this before. She is seeing Dr. Cleopatra Cedar for evaluation and treatment options.  Provided education about and advised patient to utilize infection prevention strategies to reduce risk of respiratory infection. The patient is mindful of her surroundings and is safe. The patient knows what worse looks like and to call for changes or needs.  Discussed the importance of adequate rest and management of fatigue with COPD Screening for signs and symptoms of depression related to chronic disease state  Assessed social determinant of health barriers   Symptom Management: Take medications as prescribed   Attend all scheduled provider appointments Call provider office for new concerns or questions  call the Suicide and Crisis Lifeline: 988 call the [place] National Suicide Prevention Lifeline: (562)429-7927 or TTY: 716-698-3428 TTY 865-001-2224) to talk to a trained counselor call 1-800-273-TALK (toll free, 24 hour hotline) if experiencing a Mental Health or Behavioral Health Crisis  identify and remove indoor air pollutants limit outdoor activity during cold weather listen for public air quality announcements every day develop a rescue plan eliminate symptom triggers at home follow rescue plan if symptoms flare-up  Follow Up Plan: Telephone follow up appointment with  care management team member scheduled for: 12-29-2023 at 345 pm          Consent to Services:  Patient was given information about care management services, agreed to services, and gave verbal consent to participate.   Plan: Telephone follow up appointment with care management team member scheduled for: 12-29-2023 at 345 pm  Alto Denver RN, MSN, CCM RN Care Manager  [facility]  Ambulatory Care Management  Direct Number: [phone]

## 2023-11-04 NOTE — Patient Instructions (Signed)
Visit Information  Thank you for taking time to visit with me today. Please don't hesitate to contact me if I can be of assistance to you before our next scheduled telephone appointment.  Following are the goals we discussed today:   Goals Addressed             This Visit's Progress    RNCM Care Management Expected Outcome:  Monitor, Self-Manage and Reduce Symptoms of: Depression, Anxiety, Stress       Current Barriers:  Care Coordination needs related to resources in the community for help with mental health needs and community resources for financial support in a patient with anxiety, depression, and stress Chronic Disease Management support and education needs related to effective management of stress, anxiety, and depression  Planned Interventions: Evaluation of current treatment plan related to stress, anxiety, and depression and patient's adherence to plan as established by provider.  Is working with the LCSW and states that has been helpful. She has made a bucket list and she [facility] She says that she is interested in the socialization and events there. Her neighbor passed away since last outreach and this has been hard on her. He was a good friend. She reflected on how good a friend and neighbor he was and how she is making sure she keeps a check on his wife. She also is helping take care of her ex husband right now. Her son is on his way to get him now. She says that she is glad she can help take care of him when needed. She says that she wants to get back to journal writing but has not been able to yet. Education and support given. Reflective listening.  Advised patient to call the office for changes in stress, anxiety, and depression Provided education to patient re: working with the CCM team, support available with resources, ongoing outreaches for effective management of stress, anxiety, and depression Reviewed medications with patient and  discussed compliance. The patient is compliant with her medications. She was able to get pap for forteo and has picked this up from the office.  The pharm D has talked with the patient.  Reviewed scheduled/upcoming provider appointments including 11-24-2023 at 130 pm with the pcp Care Guide referral for resources in the community to help with food resources and financial constraints. Has been provided with information. The patient received information from the care guides but she states that none of those programs can actually help her. Will continue to monitor for changes and new programs that may be a resource for the patient.  Social Work referral for ongoing support and education related to effective management of depression, anxiety, stress. The patient has talked with the LCSW. She knows she needs to take things slow. The patient knows the care team is available to assist with needs. Continues to work with the SW on a regular basis.  Pharmacy referral for ongoing support and education of medication needs and management. Ongoing support and education from the pharm D. Discussed plans with patient for ongoing care management follow up and provided patient with direct contact information for care management team Advised patient to discuss changes in mood, anxiety, depression, or mental health needs with provider Screening for signs and symptoms of depression related to chronic disease state  Assessed social determinant of health barriers Discussed self care and doing hobbies that make the patient happy or going on events that do not cost money, or doing  things with family and friends. Talked to the patient about [employer] as this is an online bible study group that may help also with Bible study resources.   The patient knows to call the [facility] in between outreaches for new needs or concerns.   Symptom Management: Take medications as prescribed   Attend all scheduled provider appointments Call  provider office for new concerns or questions  call the Suicide and Crisis Lifeline: 988 call the [place] National Suicide Prevention Lifeline: 380-609-1119 or TTY: (614)431-7563 TTY 979-021-5360) to talk to a trained counselor call 1-800-273-TALK (toll free, 24 hour hotline) if experiencing a Mental Health or Behavioral Health Crisis   Follow Up Plan: Telephone follow up appointment with care management team member scheduled for:12-29-2023 at 345 pm        RNCM Care Management Expected Outcome:  Monitor, Self-Manage, and Reduce Symptoms of Hypertension       Current Barriers:  Chronic Disease Management support and education needs related to effective management of HTN BP Readings from Last 3 Encounters:  08/17/23 130/70  06/08/23 100/70  03/03/23 106/68    Planned Interventions: Evaluation of current treatment plan related to hypertension self management and patient's adherence to plan as established by provider. Her blood pressures are stable and she denies any acute changes in her HTN or heart health. Will continue to monitor.    Provided education to patient re: stroke prevention, s/s of heart attack and stroke; Reviewed prescribed diet heart healthy diet. The patient is compliant with a heart healthy diet.  Reviewed medications with patient and discussed importance of compliance. The patient is compliant with medications. Works with the pharm D on an ongoing basis;  Discussed plans with patient for ongoing care management follow up and provided patient with direct contact information for care management team; Advised patient, providing education and rationale, to monitor blood pressure daily and record, calling PCP for findings outside established parameters;  Reviewed scheduled/upcoming provider appointments including: 11-24-2023 at 130 pm Advised patient to discuss changes in HTN or heart health with provider; Provided education on prescribed diet heart healthy ;  Discussed  complications of poorly controlled blood pressure such as heart disease, stroke, circulatory complications, vision complications, kidney impairment, sexual dysfunction;  Screening for signs and symptoms of depression related to chronic disease state;  Assessed social determinant of health barriers;   Symptom Management: Take medications as prescribed   Attend all scheduled provider appointments Call provider office for new concerns or questions  call the Suicide and Crisis Lifeline: 988 call the [place] National Suicide Prevention Lifeline: (857) 163-6970 or TTY: 984-350-6010 TTY 5085447790) to talk to a trained counselor call 1-800-273-TALK (toll free, 24 hour hotline) if experiencing a Mental Health or Behavioral Health Crisis  check blood pressure weekly learn about high blood pressure call doctor for signs and symptoms of high blood pressure develop an action plan for high blood pressure keep all doctor appointments take medications for blood pressure exactly as prescribed report new symptoms to your doctor  Follow Up Plan: Telephone follow up appointment with care management team member scheduled for: 12-29-2023 at 345 pm       RNCM Care Management Maintain, Monitor and Self-Manage Symptoms of COPD       Current Barriers:  Chronic Disease Management support and education needs related to effective COPD  Planned Interventions: Provided patient with basic written and verbal COPD education on self care/management/and exacerbation prevention.  She is happy for the cooler weather. Is not able to do a  lot due to her back pain and discomfort.   Denies any acute exacerbations of her COPD.  Advised patient to track and manage COPD triggers. Is aware of things that exacerbate her condition. Provided written and verbal instructions on pursed lip breathing and utilized returned demonstration as teach back Provided instruction about proper use of medications used for management of COPD including  inhalers Advised patient to self assesses COPD action plan zone and make appointment with provider if in the yellow zone for 48 hours without improvement Advised patient to engage in light exercise as tolerated 3-5 days a week to aid in the the management of COPD. The patient is doing some things outside and wants to be more active. She deals with chronic pain and will be seeing the neurosurgeon again after her MRI for results and recommendations for her back pain and discomfort. The patient is having periods of numbness in her legs and has not had this before. She is seeing Dr. Cleopatra Cedar for evaluation and treatment options.  Provided education about and advised patient to utilize infection prevention strategies to reduce risk of respiratory infection. The patient is mindful of her surroundings and is safe. The patient knows what worse looks like and to call for changes or needs.  Discussed the importance of adequate rest and management of fatigue with COPD Screening for signs and symptoms of depression related to chronic disease state  Assessed social determinant of health barriers   Symptom Management: Take medications as prescribed   Attend all scheduled provider appointments Call provider office for new concerns or questions  call the Suicide and Crisis Lifeline: 988 call the [place] National Suicide Prevention Lifeline: 858 409 6229 or TTY: (717)247-0242 TTY (661)416-2290) to talk to a trained counselor call 1-800-273-TALK (toll free, 24 hour hotline) if experiencing a Mental Health or Behavioral Health Crisis  identify and remove indoor air pollutants limit outdoor activity during cold weather listen for public air quality announcements every day develop a rescue plan eliminate symptom triggers at home follow rescue plan if symptoms flare-up  Follow Up Plan: Telephone follow up appointment with care management team member scheduled for: 12-29-2023 at 345 pm           Our next  appointment is by telephone on 12-29-2023 at 345 pm  Please call the care guide team at [phone] if you need to cancel or reschedule your appointment.   If you are experiencing a Mental Health or Behavioral Health Crisis or need someone to talk to, please call the Suicide and Crisis Lifeline: 988 call the [place] National Suicide Prevention Lifeline: 581-160-5735 or TTY: 937-334-0859 TTY 6405469444) to talk to a trained counselor call 1-800-273-TALK (toll free, 24 hour hotline) go to [facility] [address], [place] [phone])   Patient verbalizes understanding of instructions and care plan provided today and agrees to view in MyChart. Active MyChart status and patient understanding of how to access instructions and care plan via MyChart confirmed with patient.     Telephone follow up appointment with care management team member scheduled for: 12-29-2023 at 345 pm  Alto Denver RN, MSN, CCM RN Care Manager  [facility]  Ambulatory Care Management  Direct Number: [phone]

## 2023-11-07 NOTE — Telephone Encounter (Signed)
FAXED PROVIDER PAGES TO OFFICE OF [facility] for FORTEO([employer] [employer]) FOR PAP   PLEASE BE ADVISED RECEIVE PAT PAGES.

## 2023-11-11 ENCOUNTER — Other Ambulatory Visit: Payer: Self-pay | Admitting: Family Medicine

## 2023-11-14 ENCOUNTER — Other Ambulatory Visit: Payer: Self-pay | Admitting: Family Medicine

## 2023-11-15 ENCOUNTER — Ambulatory Visit: Payer: Self-pay | Admitting: *Deleted

## 2023-11-15 NOTE — Patient Instructions (Signed)
Visit Information  Thank you for taking time to visit with me today. Please don't hesitate to contact me if I can be of assistance to you.   Following are the goals we discussed today:   Goals Addressed             This Visit's Progress    Depression minimized       Activities and task to complete in order to accomplish goals.   Enjoy your new grandson and family time/Thanksgiving! Practice good self care with healthy boundaries  Continue reading, reflecting and working on good self care, healing, etc Continue Wellbutrin dose prescribed by Dr Sedalia Muta Continue to manage your budget/finances and seek ways to cut cost where you possibly can Continue with your walking and activity- glad you got a free Rollator walker!  Consider journaling and letter writing as previously discussed Inquire with your DSS/Medicaid office about possible eligiblity for full Medicaid (as opposed to current partial Medicaid that only pays your premium) as well as compare options for open enrollment in October Continue with your positive self care- walking,etc Attend gym.silver sneakers  `          Our next appointment is by telephone on 11/30/23  Please call the care guide team at [phone] if you need to cancel or reschedule your appointment.   If you are experiencing a Mental Health or Behavioral Health Crisis or need someone to talk to, please call the Suicide and Crisis Lifeline: 988 call 911   The patient verbalized understanding of instructions, educational materials, and care plan provided today and DECLINED offer to receive copy of patient instructions, educational materials, and care plan.   Telephone follow up appointment with care management team member scheduled for: 11/30/23  Reece Levy, MSW, LCSW [place]  [facility], [facility] Licensed Clinical Social Worker Care Coordinator  [phone]

## 2023-11-15 NOTE — Patient Outreach (Addendum)
  Care Coordination   Follow Up Visit Note   11/15/2023 Name: Victoria Pena MRN: [id number] DOB: Dec 03, 1951  Victoria Pena is a 72 y.o. year old female who sees [facility], Fritzi Mandes, MD for primary care. I spoke with  Marko Stai Arduini by phone today.  What matters to the patients health and wellness today?  Worried about others and don't know how to put self first sometimes.    Goals Addressed             This Visit's Progress    Depression minimized       Activities and task to complete in order to accomplish goals.   Enjoy your new grandson and family time/Thanksgiving! Practice good self care with healthy boundaries  Continue reading, reflecting and working on good self care, healing, etc Continue Wellbutrin dose prescribed by Dr Sedalia Muta Continue to manage your budget/finances and seek ways to cut cost where you possibly can Continue with your walking and activity- glad you got a free Rollator walker!  Consider journaling and letter writing as previously discussed Inquire with your DSS/Medicaid office about possible eligiblity for full Medicaid (as opposed to current partial Medicaid that only pays your premium) as well as compare options for open enrollment in October Continue with your positive self care- walking,etc Attend gym.silver sneakers  `          SDOH assessments and interventions completed:  Yes     Care Coordination Interventions:  Yes, provided  Interventions Today    Flowsheet Row Most Recent Value  Chronic Disease   Chronic disease during today's visit Other  [depression]  General Interventions   General Interventions Discussed/Reviewed Doctor Visits  Doctor Visits Discussed/Reviewed Specialist  [Pt reports trying to decide about "2 shots to deaden the nerves". Encouraged pt to discuss with PCP at visit next week.]  Exercise Interventions   Physical Activity Discussed/Reviewed Physical Activity Discussed  [Pt is doing some walking- walked 1.25 miles  one day recently]  Education Interventions   Provided Verbal Education On Mental Health/Coping with Illness  [Discussed "SAD" and plans to read her Bible and other healing book]  Mental Health Interventions   Mental Health Discussed/Reviewed Mental Health Discussed, Coping Strategies  [reading bible/reflecting, writing in notebook. encouraged pt to be more aware and intentional about her choices and to strive to prioritize self more/say no]       Follow up plan: Follow up call scheduled for 11/30/23    Encounter Outcome:  Patient Visit Completed

## 2023-11-21 ENCOUNTER — Other Ambulatory Visit: Payer: Self-pay

## 2023-11-21 DIAGNOSIS — M545 Low back pain, unspecified: Secondary | ICD-10-CM

## 2023-11-21 MED ORDER — OXYCODONE HCL 10 MG PO TABS
10.0000 mg | ORAL_TABLET | Freq: Four times a day (QID) | ORAL | 0 refills | Status: DC | PRN
Start: 2023-11-21 — End: 2023-12-16

## 2023-11-21 NOTE — Telephone Encounter (Addendum)
Patient has requested to go down at least 3 times a day or go a lower dose of medication due to her wanting to return back to work due to needing to work, and pain doctor told her they do not fell comfortable writing rx for 4 times a day. Please advise  I made her aware that she does not have to take them 4 times a day, it is as needed, so she can try taking them 2/3 times a day and if she needs it 4 times some days she will have  it, but she only has to take it when she needs it for pain.   Copied from CRM [id number]. Topic: Clinical - Medication Refill >> Nov 21, 2023  8:50 AM Herbert Seta B wrote: Most Recent Primary Care Visit:  Provider: [facility]  Department: [facility]  Visit Type: FLU SHOT  Date: 11/03/2023  Medication:  Oxycodone HCl 10 MG TABS  *PATIENT CALLING TO HAVE NEW RX SENT, WITH LOWER DOSAGE OR SWITCH TO BID. PAIN MANAGEMENT PHYSICIAN REQUESTING CHANGE.  Has the patient contacted their pharmacy? NO (C2) (Agent: If no, request that the patient contact the pharmacy for the refill. If patient does not wish to contact the pharmacy document the reason why and proceed with request.) (Agent: If yes, when and what did the pharmacy advise?)  Is this the correct pharmacy for this prescription? yes If no, delete pharmacy and type the correct one.  This is the patient's preferred pharmacy:  [id number] Rosalita Levan, [place] - [address] AT [facility] [address] [place] [address] Phone: [phone] Fax: [phone]     Has the prescription been filled recently? yes  Is the patient out of the medication? no  Has the patient been seen for an appointment in the last year OR does the patient have an upcoming appointment? yes  Can we respond through MyChart? No-PLEASE CALL  Agent: Please be advised that Rx refills may take up to 3 business days. We ask that you follow-up with your pharmacy.

## 2023-11-23 ENCOUNTER — Other Ambulatory Visit: Payer: Self-pay | Admitting: Family Medicine

## 2023-11-23 DIAGNOSIS — F418 Other specified anxiety disorders: Secondary | ICD-10-CM

## 2023-11-24 ENCOUNTER — Ambulatory Visit (INDEPENDENT_AMBULATORY_CARE_PROVIDER_SITE_OTHER): Payer: Medicare HMO | Admitting: Family Medicine

## 2023-11-24 VITALS — BP 140/90 | HR 93 | Temp 97.2°F | Ht 65.0 in | Wt 190.0 lb

## 2023-11-24 DIAGNOSIS — Z1159 Encounter for screening for other viral diseases: Secondary | ICD-10-CM | POA: Diagnosis not present

## 2023-11-24 DIAGNOSIS — I10 Essential (primary) hypertension: Secondary | ICD-10-CM | POA: Diagnosis not present

## 2023-11-24 DIAGNOSIS — M545 Low back pain, unspecified: Secondary | ICD-10-CM

## 2023-11-24 DIAGNOSIS — E559 Vitamin D deficiency, unspecified: Secondary | ICD-10-CM

## 2023-11-24 DIAGNOSIS — E782 Mixed hyperlipidemia: Secondary | ICD-10-CM | POA: Diagnosis not present

## 2023-11-24 DIAGNOSIS — G894 Chronic pain syndrome: Secondary | ICD-10-CM | POA: Diagnosis not present

## 2023-11-24 DIAGNOSIS — F5101 Primary insomnia: Secondary | ICD-10-CM

## 2023-11-24 DIAGNOSIS — M810 Age-related osteoporosis without current pathological fracture: Secondary | ICD-10-CM | POA: Diagnosis not present

## 2023-11-24 NOTE — Progress Notes (Unsigned)
Subjective:  Patient ID: Victoria Pena, female    DOB: 05-03-1951  Age: 72 y.o. MRN: [id number]  Chief Complaint  Patient presents with   Medical Management of Chronic Issues    HPI  History of Present Illness The patient, with a history of chronic back pain, presents with a recent exacerbation of her back pain. She describes a sharp pain on the left side of her back, near the spine, that occurred while she was trying to adjust her position in bed. The pain radiated across her back and has left her feeling weak. She is currently on Celebrex and oxycodone for pain management, and Lyrica for nerve pain. She also mentions that she has been trying to reduce her oxycodone intake to only three times a day as needed. .  The patient also reports increased bowel pressure and anxiety. She attributes her anxiety to recent financial stress and has been taking Xanax as needed to manage it. She also mentions a lack of appetite and irregular sleep patterns, often staying up until the early hours of the morning. She expresses interest in trying melatonin to help regulate her sleep.  In addition to these issues, the patient is on Forteo for osteoporosis, atorvastatin for cholesterol, and Wellbutrin and Lexapro for mood management. She also mentions having emphysema, which affects her ability to exercise in cold weather.  Depression/anxiety: Patient is taking wellbutrin 150 mg daily, lexapro 10 mg daily, xanax 0.5 mg BID PRN.  Hyperlipidemia:  Currently taking Atorvastatin 40 mg daily.  Vitamin D defiency: Taking vitamin D  Hypertension: Losartan 50 mg daily.  Fibromyalgia: Lyrica 150 mg BID,   Chronic pain syndrome due to back pain: On Oxycodone 10 mg TID.  Hypothyroidism: Levothyroidism 125 mcg daily  GerD: pantoprazole 40 mg daily and famotidine 40 mg daily.      11/24/2023    1:50 PM 10/06/2023    1:21 PM 08/17/2023    8:37 AM 06/08/2023    3:20 PM 03/01/2023    3:00 PM  Depression screen  PHQ 2/9  Decreased Interest 2 2 2 3 1   Down, Depressed, Hopeless 1 2 2 3  0  PHQ - 2 Score 3 4 4 6 1   Altered sleeping 3 2 2 3 1   Tired, decreased energy 2 2 2 3 1   Change in appetite 3 2 2 3  0  Feeling bad or failure about yourself  1 0 0 0 0  Trouble concentrating 2 2 2 2  0  Moving slowly or fidgety/restless 0 0 0 0 0  Suicidal thoughts 0 0 0 0 0  PHQ-9 Score 14 12 12 17 3   Difficult doing work/chores Somewhat difficult Somewhat difficult Somewhat difficult Extremely dIfficult Somewhat difficult        10/06/2023    1:29 PM  Fall Risk   Falls in the past year? 0  Number falls in past yr: 0  Injury with Fall? 0  Risk for fall due to : No Fall Risks  Follow up Falls prevention discussed    Patient Care Team: Blane Ohara, MD as PCP - General (Family Medicine) Buck Mam, LCSW as Social Worker Marlowe Sax, RN as Case Manager (General Practice)   Review of Systems  Constitutional:  Negative for chills, fatigue and fever.  HENT:  Negative for congestion, ear pain and sore throat.   Respiratory:  Negative for cough and shortness of breath.   Cardiovascular:  Negative for chest pain and palpitations.  Gastrointestinal:  Negative for  abdominal pain, constipation, diarrhea, nausea and vomiting.  Endocrine: Negative for polydipsia, polyphagia and polyuria.  Genitourinary:  Negative for difficulty urinating and dysuria.  Musculoskeletal:  Positive for arthralgias and back pain. Negative for myalgias.  Skin:  Negative for rash.  Neurological:  Negative for headaches.  Psychiatric/Behavioral:  Positive for sleep disturbance. Negative for dysphoric mood. The patient is not nervous/anxious.     Current Outpatient Medications on File Prior to Visit  Medication Sig Dispense Refill   ALPRAZolam (XANAX) 0.5 MG tablet TAKE 1 TABLET(0.5 MG) BY MOUTH TWICE DAILY AS NEEDED FOR ANXIETY 60 tablet 2   atorvastatin (LIPITOR) 40 MG tablet TAKE 1 TABLET(40 MG) BY MOUTH DAILY 90 tablet 0    buPROPion (WELLBUTRIN XL) 300 MG 24 hr tablet TAKE 1 TABLET(300 MG) BY MOUTH DAILY 90 tablet 0   Calcium Carb-Cholecalciferol (CALCIUM 500 + D3) 500-5 MG-MCG TABS Take 3 tablets by mouth daily. (Patient taking differently: Take 1 tablet by mouth in the morning and at bedtime.) 100 tablet 5   calcium carbonate (TUMS - DOSED IN MG ELEMENTAL CALCIUM) 500 MG chewable tablet Chew 1 tablet by mouth 2 (two) times daily as needed for indigestion or heartburn.     celecoxib (CELEBREX) 200 MG capsule TAKE 1 CAPSULE(200 MG) BY MOUTH DAILY 90 capsule 0   EPINEPHrine 0.3 mg/0.3 mL IJ SOAJ injection Inject 0.3 mg into the muscle as needed for anaphylaxis. 1 each 1   escitalopram (LEXAPRO) 10 MG tablet Take 1 tablet (10 mg total) by mouth daily. 90 tablet 1   famotidine (PEPCID) 40 MG tablet Take 1 tablet (40 mg total) by mouth daily. 90 tablet 1   FORTEO 600 MCG/2.4ML SOPN Inject 20 mcg into the skin daily.     Insulin Pen Needle 32G X 4 MM MISC 1 each by Does not apply route daily. 100 each 3   levothyroxine (SYNTHROID) 125 MCG tablet TAKE 1 TABLET BY MOUTH DAILY BEFORE BREAKFAST 90 tablet 0   losartan (COZAAR) 50 MG tablet Take 1 tablet (50 mg total) by mouth daily. 90 tablet 1   metroNIDAZOLE (METROGEL) 0.75 % gel Apply 1 application topically 2 (two) times daily. (Patient taking differently: Apply 1 application  topically 2 (two) times daily as needed (rosacea).) 45 g 3   Multiple Vitamin (MULTIVITAMIN WITH MINERALS) TABS tablet Take 1 tablet by mouth in the morning and at bedtime.     mupirocin ointment (BACTROBAN) 2 % Apply 1 Application topically 2 (two) times daily. 22 g 0   Oxycodone HCl 10 MG TABS Take 1 tablet (10 mg total) by mouth 4 (four) times daily as needed (severe pain). 120 tablet 0   pantoprazole (PROTONIX) 40 MG tablet Take 1 tablet (40 mg total) by mouth daily. 90 tablet 2   pregabalin (LYRICA) 150 MG capsule TAKE 1 CAPSULE(150 MG) BY MOUTH TWICE DAILY 60 capsule 3   trolamine salicylate  (ASPERCREME) 10 % cream Apply 1 Application topically as needed for muscle pain.     Vitamin D, Ergocalciferol, (DRISDOL) 1.25 MG (50000 UNIT) CAPS capsule TAKE 1 CAPSULE BY MOUTH 2 TIMES A WEEK 24 capsule 0   No current facility-administered medications on file prior to visit.   Past Medical History:  Diagnosis Date   Acquired spondylolisthesis 05/12/2015   Adenocarcinoma of right lung, stage 1 (HCC) 07/26/2016   Anxiety    Arthritis    Arthropathy of lumbar facet joint 04/11/2019   Formatting of this note might be different from the original. Added  automatically from request for surgery [phone]   Atherosclerosis of aorta (HCC) 07/08/2021   Benign essential hypertension 12/27/2016   BMI 30.0-30.9,adult 12/23/2020   COPD (chronic obstructive pulmonary disease) (HCC)    pt denies   Current mild episode of major depressive disorder (HCC) 03/24/2020   Daytime sleepiness 08/03/2021   Depression    Fibromyalgia 03/24/2020   Gastroesophageal reflux disease without esophagitis 03/24/2020   Graves disease 12/27/2016   Hypertension    Osteoporosis 08/03/2021   Pericardial cyst 02/02/2021   Postoperative hypothyroidism 01/17/2015   Reactive airways dysfunction syndrome, unspecified asthma severity, uncomplicated (HCC) 09/06/2015   S/P lobectomy of lung 07/26/2015   Formatting of this note might be different from the original. Right upper   S/P lumbar spinal fusion 05/11/2018   Thyroid disease    Toe ulcer, left, limited to breakdown of skin (HCC) 12/23/2020   Past Surgical History:  Procedure Laterality Date   ANKLE SURGERY     bilateral   APPENDECTOMY     BREAST BIOPSY Left 02/25/2020   BREAST BIOPSY Right 08/17/2018   BREAST EXCISIONAL BIOPSY Right    unsure when but marked with scar marker   CHOLECYSTECTOMY     EYE MUSCLE SURGERY Bilateral    4-5 years ago   LAMINECTOMY WITH POSTERIOR LATERAL ARTHRODESIS LEVEL 2 N/A 01/21/2023   Procedure: Laminectomy  - L1-L2 - L2-L3 with  non-instrumented posterior lateral fusion;  Surgeon: Tia Alert, MD;  Location: [facility] OR;  Service: Neurosurgery;  Laterality: N/A;   LUMBAR FUSION  2019   L4, 5, 6   REPLACEMENT TOTAL KNEE Left    THYROIDECTOMY      Family History  Problem Relation Age of Onset   Cancer Mother    Cancer Father    Cancer Brother    Autism Grandson    Breast cancer Neg Hx    Social History   Socioeconomic History   Marital status: Widowed    Spouse name: Not on file   Number of children: Not on file   Years of education: Not on file   Highest education level: GED or equivalent  Occupational History   Not on file  Tobacco Use   Smoking status: Former    Current packs/day: 0.00    Average packs/day: 1.5 packs/day for 35.0 years (52.5 ttl pk-yrs)    Types: Cigarettes    Start date: 07/26/1964    Quit date: 07/27/1999    Years since quitting: 24.3   Smokeless tobacco: Never  Vaping Use   Vaping status: Never Used  Substance and Sexual Activity   Alcohol use: No   Drug use: No   Sexual activity: Not Currently  Other Topics Concern   Not on file  Social History Narrative   Not on file   Social Determinants of Health   Financial Resource Strain: High Risk (11/20/2023)   Overall Financial Resource Strain (CARDIA)    Difficulty of Paying Living Expenses: Hard  Food Insecurity: Food Insecurity Present (11/20/2023)   Hunger Vital Sign    Worried About Running Out of Food in the Last Year: Often true    Ran Out of Food in the Last Year: Often true  Transportation Needs: No Transportation Needs (11/20/2023)   PRAPARE - Administrator, Civil Service (Medical): No    Lack of Transportation (Non-Medical): No  Physical Activity: Sufficiently Active (11/20/2023)   Exercise Vital Sign    Days of Exercise per Week: 4 days    Minutes  of Exercise per Session: 40 min  Recent Concern: Physical Activity - Insufficiently Active (10/05/2023)   Exercise Vital Sign    Days of Exercise per Week: 2  days    Minutes of Exercise per Session: 30 min  Stress: Stress Concern Present (11/20/2023)   [employer] of Occupational Health - Occupational Stress Questionnaire    Feeling of Stress : To some extent  Social Connections: Moderately Isolated (11/20/2023)   Social Connection and Isolation Panel [NHANES]    Frequency of Communication with Friends and Family: More than three times a week    Frequency of Social Gatherings with Friends and Family: Once a week    Attends Religious Services: Never    Database administrator or Organizations: Yes    Attends Banker Meetings: Never    Marital Status: Widowed    Objective:  BP (!) 140/90   Pulse 93   Temp (!) 97.2 F (36.2 C)   Ht 5\' 5"  (1.651 m)   Wt 190 lb (86.2 kg)   LMP  (LMP Unknown)   SpO2 96%   BMI 31.62 kg/m      11/24/2023    1:36 PM 10/06/2023    1:04 PM 08/17/2023    8:35 AM  BP/Weight  Systolic BP 140 -- 130  Diastolic BP 90 -- 70  Wt. (Lbs) 190 184 184  BMI 31.62 kg/m2 30.62 kg/m2 30.62 kg/m2    Physical Exam Vitals reviewed.  Constitutional:      Appearance: Normal appearance. She is normal weight.  Neck:     Vascular: No carotid bruit.  Cardiovascular:     Rate and Rhythm: Normal rate and regular rhythm.     Heart sounds: Normal heart sounds.  Pulmonary:     Effort: Pulmonary effort is normal. No respiratory distress.     Breath sounds: Normal breath sounds.  Abdominal:     General: Abdomen is flat. Bowel sounds are normal.     Palpations: Abdomen is soft.     Tenderness: There is no abdominal tenderness.  Musculoskeletal:        General: Tenderness (left lumbar paraspinal muscles.) present.  Neurological:     Mental Status: She is alert and oriented to person, place, and time.  Psychiatric:        Mood and Affect: Mood normal.        Behavior: Behavior normal.     Diabetic Foot Exam - Simple   No data filed      Lab Results  Component Value Date   WBC 5.1 11/24/2023   HGB  12.5 11/24/2023   HCT 39.0 11/24/2023   PLT 226 11/24/2023   GLUCOSE 86 11/24/2023   CHOL 122 06/09/2023   TRIG 46 06/09/2023   HDL 69 06/09/2023   LDLCALC 42 06/09/2023   ALT 10 11/24/2023   AST 15 11/24/2023   NA 144 11/24/2023   K 3.9 11/24/2023   CL 106 11/24/2023   CREATININE 0.71 11/24/2023   BUN 16 11/24/2023   CO2 22 11/24/2023   TSH 1.190 08/17/2023   INR 1.0 12/31/2022      Assessment & Plan:    Age-related osteoporosis without current pathological fracture Assessment & Plan: currently taking Forteo, and we will plan for a bone density scan at the end of your two-year treatment course.   Benign essential hypertension Assessment & Plan: Well controlled.  No changes to medicines. Losartan 50 mg daily.  Continue to work on eating a healthy diet  and exercise.  Labs drawn today.   Orders: -     CBC with Differential/Platelet -     Comprehensive metabolic panel  Mild vitamin D deficiency Assessment & Plan: Vitamin D level will be checked today  Encourage to eat vitamin rich diets  Orders: -     VITAMIN D 25 Hydroxy (Vit-D Deficiency, Fractures)  Lumbar back pain Assessment & Plan: Acute on chronic back pain. We will continue your current pain medications, Celebrex and Oxycodone. Order lumbar spine xray  Orders: -     DG Lumbar Spine 2-3 Views; Future  Chronic pain syndrome Assessment & Plan: Order UDS  Orders: -     Pain Mgt Scrn (14 Drugs), Ur  Need for hepatitis C screening test -     HCV Ab w Reflex to Quant PCR  Mixed hyperlipidemia Assessment & Plan: Well controlled.  No changes to medicines. Currently taking Atorvastatin 40 mg daily.  Continue to work on eating a healthy diet and exercise.      Primary insomnia Assessment & Plan: Reports difficulty establishing a sleep pattern, staying up late into the night. -Consider trial of melatonin. -Advise on sleep hygiene, including limiting bed activities to sleep and intimacy.      No orders of the defined types were placed in this encounter.   Orders Placed This Encounter  Procedures   DG Lumbar Spine 2-3 Views   CBC with Differential/Platelet   Comprehensive metabolic panel   VITAMIN D 25 Hydroxy (Vit-D Deficiency, Fractures)   Pain Mgt Scrn (14 Drugs), Ur   HCV Ab w Reflex to Quant PCR     Follow-up: Return in about 3 months (around 02/22/2024) for chronic follow up.   I,Marla I Leal-Borjas,acting as a scribe for Blane Ohara, MD.,have documented all relevant documentation on the behalf of Blane Ohara, MD,as directed by  Blane Ohara, MD while in the presence of Blane Ohara, MD.   An After Visit Summary was printed and given to the patient.  I attest that I have reviewed this visit and agree with the plan scribed by my staff.   Blane Ohara, MD [facility] [phone]

## 2023-11-24 NOTE — Patient Instructions (Signed)
VISIT SUMMARY:  During today's visit, we discussed your recent exacerbation of back pain, increased anxiety, sleep disturbances, and general health maintenance. We reviewed your current medications and made some recommendations to help manage your symptoms more effectively.  YOUR PLAN:  -ACUTE BACK PAIN: You have a new sharp pain on the left side of your back that started after a twisting motion. We will continue your current pain medications, Celebrex and Oxycodone, and may consider imaging if needed based on your physical exam.  -CHRONIC BACK PAIN: Your longstanding back pain continues to be managed with Celebrex, Oxycodone, and Lyrica. No changes to your current treatment plan are necessary at this time.  -ANXIETY: Your anxiety, which is related to financial stress, is being managed with Wellbutrin, Lexapro, and Xanax as needed. We will continue with this management plan.  -OSTEOPOROSIS: Osteoporosis is a condition where bones become weak and brittle. You are currently taking Forteo, and we will plan for a bone density scan at the end of your two-year treatment course.  -HYPERLIPIDEMIA: Hyperlipidemia means you have high cholesterol levels. You are currently taking Atorvastatin, and we will continue this medication.  -SLEEP DISTURBANCE: You are having trouble establishing a regular sleep pattern. We recommend trying melatonin and practicing good sleep hygiene, such as limiting bed activities to sleep and intimacy.  -HEPATITIS C SCREENING: We are unsure if you have been screened for Hepatitis C before, so we will order a screening test to check for this infection.  -GENERAL HEALTH MAINTENANCE: We will order routine blood work and collect a urine sample to monitor your overall health.  INSTRUCTIONS:  Please follow up with [place] if your back pain does not improve or if you experience any new symptoms. We will also contact you with the results of your Hepatitis C screening and routine blood  work.

## 2023-11-25 ENCOUNTER — Encounter: Payer: Self-pay | Admitting: Family Medicine

## 2023-11-25 LAB — CBC WITH DIFFERENTIAL/PLATELET
Basophils Absolute: 0 10*3/uL (ref 0.0–0.2)
Basos: 1 %
EOS (ABSOLUTE): 0.1 10*3/uL (ref 0.0–0.4)
Eos: 3 %
Hematocrit: 39 % (ref 34.0–46.6)
Hemoglobin: 12.5 g/dL (ref 11.1–15.9)
Immature Grans (Abs): 0 10*3/uL (ref 0.0–0.1)
Immature Granulocytes: 0 %
Lymphocytes Absolute: 1.2 10*3/uL (ref 0.7–3.1)
Lymphs: 23 %
MCH: 27.8 pg (ref 26.6–33.0)
MCHC: 32.1 g/dL (ref 31.5–35.7)
MCV: 87 fL (ref 79–97)
Monocytes Absolute: 0.5 10*3/uL (ref 0.1–0.9)
Monocytes: 10 %
Neutrophils Absolute: 3.3 10*3/uL (ref 1.4–7.0)
Neutrophils: 63 %
Platelets: 226 10*3/uL (ref 150–450)
RBC: 4.5 x10E6/uL (ref 3.77–5.28)
RDW: 13.4 % (ref 11.7–15.4)
WBC: 5.1 10*3/uL (ref 3.4–10.8)

## 2023-11-25 LAB — COMPREHENSIVE METABOLIC PANEL
ALT: 10 [IU]/L (ref 0–32)
AST: 15 [IU]/L (ref 0–40)
Albumin: 3.9 g/dL (ref 3.8–4.8)
Alkaline Phosphatase: 114 [IU]/L (ref 44–121)
BUN/Creatinine Ratio: 23 (ref 12–28)
BUN: 16 mg/dL (ref 8–27)
Bilirubin Total: 0.4 mg/dL (ref 0.0–1.2)
CO2: 22 mmol/L (ref 20–29)
Calcium: 9 mg/dL (ref 8.7–10.3)
Chloride: 106 mmol/L (ref 96–106)
Creatinine, Ser: 0.71 mg/dL (ref 0.57–1.00)
Globulin, Total: 2.6 g/dL (ref 1.5–4.5)
Glucose: 86 mg/dL (ref 70–99)
Potassium: 3.9 mmol/L (ref 3.5–5.2)
Sodium: 144 mmol/L (ref 134–144)
Total Protein: 6.5 g/dL (ref 6.0–8.5)
eGFR: 90 mL/min/{1.73_m2} (ref >59)

## 2023-11-25 LAB — HCV AB W REFLEX TO QUANT PCR: HCV Ab: NONREACTIVE

## 2023-11-25 LAB — VITAMIN D 25 HYDROXY (VIT D DEFICIENCY, FRACTURES): Vit D, 25-Hydroxy: 48.9 ng/mL (ref 30.0–100.0)

## 2023-11-25 LAB — HCV INTERPRETATION

## 2023-11-26 DIAGNOSIS — M545 Low back pain, unspecified: Secondary | ICD-10-CM | POA: Insufficient documentation

## 2023-11-26 DIAGNOSIS — G894 Chronic pain syndrome: Secondary | ICD-10-CM | POA: Insufficient documentation

## 2023-11-26 LAB — PAIN MGT SCRN (14 DRUGS), UR
Amphetamine Scrn, Ur: NEGATIVE ng/mL
BARBITURATE SCREEN URINE: NEGATIVE ng/mL
BENZODIAZEPINE SCREEN, URINE: POSITIVE ng/mL — AB
Buprenorphine, Urine: NEGATIVE ng/mL
CANNABINOIDS UR QL SCN: NEGATIVE ng/mL
Cocaine (Metab) Scrn, Ur: NEGATIVE ng/mL
Creatinine(Crt), U: 146.9 mg/dL (ref 20.0–300.0)
Fentanyl, Urine: NEGATIVE pg/mL
Meperidine Screen, Urine: NEGATIVE ng/mL
Methadone Screen, Urine: NEGATIVE ng/mL
OXYCODONE+OXYMORPHONE UR QL SCN: POSITIVE ng/mL — AB
Opiate Scrn, Ur: POSITIVE ng/mL — AB
Ph of Urine: 5.5 (ref 4.5–8.9)
Phencyclidine Qn, Ur: NEGATIVE ng/mL
Propoxyphene Scrn, Ur: NEGATIVE ng/mL
Tramadol Screen, Urine: NEGATIVE ng/mL

## 2023-11-26 NOTE — Assessment & Plan Note (Signed)
Vitamin D level will be checked today  Encourage to eat vitamin rich diets

## 2023-11-26 NOTE — Assessment & Plan Note (Addendum)
We will continue your current pain medications, Celebrex and Oxycodone. Order lumbar spine xray

## 2023-11-26 NOTE — Assessment & Plan Note (Addendum)
Well controlled.  No changes to medicines. Currently taking Atorvastatin 40 mg daily.  Continue to work on eating a healthy diet and exercise.

## 2023-11-26 NOTE — Assessment & Plan Note (Signed)
Well controlled.  No changes to medicines. Losartan 50 mg daily Continue to work on eating a healthy diet and exercise.  Labs drawn today.

## 2023-11-26 NOTE — Assessment & Plan Note (Signed)
Order: UDS

## 2023-11-26 NOTE — Assessment & Plan Note (Signed)
currently taking Forteo, and we will plan for a bone density scan at the end of your two-year treatment course.

## 2023-11-27 ENCOUNTER — Encounter: Payer: Self-pay | Admitting: Family Medicine

## 2023-11-27 DIAGNOSIS — Z1159 Encounter for screening for other viral diseases: Secondary | ICD-10-CM | POA: Insufficient documentation

## 2023-11-27 DIAGNOSIS — F5101 Primary insomnia: Secondary | ICD-10-CM | POA: Insufficient documentation

## 2023-11-27 NOTE — Assessment & Plan Note (Signed)
Reports difficulty establishing a sleep pattern, staying up late into the night. -Consider trial of melatonin. -Advise on sleep hygiene, including limiting bed activities to sleep and intimacy.

## 2023-11-30 ENCOUNTER — Ambulatory Visit: Payer: Self-pay | Admitting: *Deleted

## 2023-11-30 NOTE — Patient Outreach (Signed)
  Care Coordination   Follow Up Visit Note   11/30/2023 Name: Chanika Batcheller MRN: [id number] DOB: [birth year]  Crystalyn Thames is a 72 y.o. year old female who sees [facility], Fritzi Mandes, MD for primary care. I spoke with  Marko Stai Geiselman by phone today.  What matters to the patients health and wellness today?  "Got my house back in order after family left"    Goals Addressed             This Visit's Progress    Depression minimized       Activities and task to complete in order to accomplish goals.   Seek opportunities for part time work (from home?)  Seek ways to engage and socialize with others Practice good self care with healthy boundaries  Continue reading, reflecting and working on good self care, healing, etc Continue Wellbutrin dose prescribed by Dr Sedalia Muta Continue to manage your budget/finances and seek ways to cut cost where you possibly can Continue with your walking and activity- glad you got a free Rollator walker!  Consider journaling and letter writing as previously discussed Inquire with your DSS/Medicaid office about possible eligiblity for full Medicaid (as opposed to current partial Medicaid that only pays your premium) as well as compare options for open enrollment in October Continue with your positive self care- walking,etc Attend gym.silver sneakers  `          SDOH assessments and interventions completed:  Yes     Care Coordination Interventions:  Yes, provided  Interventions Today    Flowsheet Row Most Recent Value  Education Interventions   Provided Verbal Education On [employer]  [Pt voices being lonely and isolated- limited ability to get out. Encouraged pt to seek opportunities- including part time work (maybe from home) and other ways to be more active and feel less isolated/lonely and disconnected.]  Mental Health Interventions   Mental Health Discussed/Reviewed Mental Health Discussed, Mental Health Reviewed, Coping Strategies  [Pt  feels she is in a good place and does not need counseling]  Safety Interventions   Safety Discussed/Reviewed Safety Discussed  [Pt denies SI/HI.]       Follow up plan: Follow up call scheduled for 01/10/23 with colleague, Hermelinda Dellen, LCSW    Encounter Outcome:  Patient Visit Completed

## 2023-11-30 NOTE — Patient Instructions (Signed)
Visit Information  Thank you for taking time to visit with me today. Please don't hesitate to contact me if I can be of assistance to you.   Following are the goals we discussed today:   Goals Addressed             This Visit's Progress    Depression minimized       Activities and task to complete in order to accomplish goals.   Seek opportunities for part time work (from home?)  Seek ways to engage and socialize with others Practice good self care with healthy boundaries  Continue reading, reflecting and working on good self care, healing, etc Continue Wellbutrin dose prescribed by Dr Sedalia Muta Continue to manage your budget/finances and seek ways to cut cost where you possibly can Continue with your walking and activity- glad you got a free Rollator walker!  Consider journaling and letter writing as previously discussed Inquire with your DSS/Medicaid office about possible eligiblity for full Medicaid (as opposed to current partial Medicaid that only pays your premium) as well as compare options for open enrollment in October Continue with your positive self care- walking,etc Attend gym.silver sneakers  `          Our next appointment is by telephone on 01/10/23 with Sue Lush.  Please call the care guide team at [phone] if you need to cancel or reschedule your appointment.   If you are experiencing a Mental Health or Behavioral Health Crisis or need someone to talk to, please call the Suicide and Crisis Lifeline: 988 call 911   The patient verbalized understanding of instructions, educational materials, and care plan provided today and DECLINED offer to receive copy of patient instructions, educational materials, and care plan.   Telephone follow up appointment with care management team member scheduled for:01/10/23  Reece Levy, MSW, LCSW [place], [facility] Licensed Clinical Social Worker Care Coordinator  [phone]

## 2023-12-01 ENCOUNTER — Other Ambulatory Visit: Payer: Self-pay | Admitting: Family Medicine

## 2023-12-01 DIAGNOSIS — K219 Gastro-esophageal reflux disease without esophagitis: Secondary | ICD-10-CM

## 2023-12-06 ENCOUNTER — Other Ambulatory Visit: Payer: Self-pay

## 2023-12-06 ENCOUNTER — Other Ambulatory Visit: Payer: Self-pay | Admitting: Family Medicine

## 2023-12-06 DIAGNOSIS — M797 Fibromyalgia: Secondary | ICD-10-CM

## 2023-12-06 NOTE — Telephone Encounter (Signed)
Patient needs refill it was sen today but was printed.

## 2023-12-08 MED ORDER — PREGABALIN 150 MG PO CAPS: 150.0000 mg | ORAL_CAPSULE | Freq: Two times a day (BID) | ORAL | 0 refills | Status: DC

## 2023-12-13 ENCOUNTER — Other Ambulatory Visit: Payer: Self-pay | Admitting: Family Medicine

## 2023-12-13 DIAGNOSIS — F064 Anxiety disorder due to known physiological condition: Secondary | ICD-10-CM

## 2023-12-16 ENCOUNTER — Other Ambulatory Visit: Payer: Self-pay | Admitting: Family Medicine

## 2023-12-16 DIAGNOSIS — G8929 Other chronic pain: Secondary | ICD-10-CM

## 2023-12-16 MED ORDER — OXYCODONE HCL 10 MG PO TABS: 10.0000 mg | ORAL_TABLET | Freq: Four times a day (QID) | ORAL | 0 refills | Status: DC | PRN

## 2023-12-16 NOTE — Telephone Encounter (Signed)
Copied from CRM [id number]. Topic: Clinical - Medication Refill >> Dec 16, 2023 10:48 AM Maxwell Marion wrote: Most Recent Primary Care Visit:  Provider: [facility], KIRSTEN  Department: [facility]  Visit Type: OFFICE VISIT  Date: 11/24/2023  Medication: Oxycodone HCl 10 MG TABS  Has the patient contacted their pharmacy? No, pt has to contact doctor for refill (Agent: If no, request that the patient contact the pharmacy for the refill. If patient does not wish to contact the pharmacy document the reason why and proceed with request.) (Agent: If yes, when and what did the pharmacy advise?)  Is this the correct pharmacy for this prescription? Yes If no, delete pharmacy and type the correct one.  This is the patient's preferred pharmacy:  [id number] Rosalita Levan, [place] - [address] AT [facility] [address] [place] [address] Phone: [phone] Fax: [phone]     Has the prescription been filled recently? Yes  Is the patient out of the medication? No, has 2 more left  Has the patient been seen for an appointment in the last year OR does the patient have an upcoming appointment?   Can we respond through MyChart?   Agent: Please be advised that Rx refills may take up to 3 business days. We ask that you follow-up with your pharmacy.

## 2023-12-17 ENCOUNTER — Other Ambulatory Visit: Payer: Self-pay | Admitting: Family Medicine

## 2023-12-17 DIAGNOSIS — K219 Gastro-esophageal reflux disease without esophagitis: Secondary | ICD-10-CM

## 2023-12-29 ENCOUNTER — Encounter: Payer: Self-pay | Admitting: *Deleted

## 2023-12-29 ENCOUNTER — Other Ambulatory Visit: Payer: Self-pay | Admitting: *Deleted

## 2023-12-29 NOTE — Patient Outreach (Signed)
 Care Management   Visit Note  12/29/2023 Name: Victoria Pena MRN: [id number] DOB: [birth year]  Subjective: Victoria Pena is a 73 y.o. year old female who is a primary care patient of Cox, Kirsten, MD. The Care Management team was consulted for assistance.      Engaged with patient spoke with patient by telephone.    Goals Addressed             This Visit's Progress    RNCM Care Management Expected Outcome:  Monitor, Self-Manage and Reduce Symptoms of: Depression, Anxiety, Stress       Current Barriers:  Care Coordination needs related to resources in the community for help with mental health needs and community resources for financial support in a patient with anxiety, depression, and stress Chronic Disease Management support and education needs related to effective management of stress, anxiety, and depression  Planned Interventions: Evaluation of current treatment plan related to stress, anxiety, and depression and patient's adherence to plan as established by provider.  Is working with the LCSW and states that has been helpful.  Reflective listening.  Advised patient to call the office for changes in stress, anxiety, and depression Provided education to patient re: working with the CCM team, support available with resources, ongoing outreaches for effective management of stress, anxiety, and depression Reviewed medications with patient and discussed compliance. The patient is compliant with her medications.   Reviewed scheduled/upcoming provider appointments including 02-29-2024 with PCP Care Guide referral for resources in the community to help with food resources and financial constraints. Will continue to monitor for changes and new programs that may be a resource for the patient.  Social Work referral for ongoing support and education related to effective management of depression, anxiety, stress. The patient has talked with the LCSW has follow up with LCSW 01-11-2024.   Pharmacy referral for ongoing support and education of medication needs and management. Ongoing support and education from the pharm D. Discussed plans with patient for ongoing care management follow up and provided patient with direct contact information for care management team Advised patient to discuss changes in mood, anxiety, depression, or mental health needs with provider Screening for signs and symptoms of depression related to chronic disease state  Assessed social determinant of health barriers  Symptom Management: Take medications as prescribed   Attend all scheduled provider appointments Call provider office for new concerns or questions  call the Suicide and Crisis Lifeline: 988 call the [place]  National Suicide Prevention Lifeline: 563-359-9401 or TTY: 8196112205 TTY (534) 103-9060) to talk to a trained counselor call 1-800-273-TALK (toll free, 24 hour hotline) if experiencing a Mental Health or Behavioral Health Crisis   Follow Up Plan: Telephone follow up appointment with care management team member scheduled for:02-27-2024 at 345 pm        RNCM Care Management Expected Outcome:  Monitor, Self-Manage, and Reduce Symptoms of Hypertension       Current Barriers:  Chronic Disease Management support and education needs related to effective management of HTN BP Readings from Last 3 Encounters:  11/24/23 (!) 140/90  08/17/23 130/70  06/08/23 100/70    Planned Interventions: Evaluation of current treatment plan related to hypertension self management and patient's adherence to plan as established by provider. Her blood pressures are stable and she denies any acute changes in her HTN or heart health.  Provided education to patient re: stroke prevention, s/s of heart attack and stroke; Reviewed prescribed diet heart healthy diet. The patient is compliant with a  heart healthy diet.  Reviewed medications with patient and discussed importance of compliance. Reports compliance with  all medications. Continues to work with PharmD. Discussed plans with patient for ongoing care management follow up and provided patient with direct contact information for care management team; Advised patient, providing education and rationale, to monitor blood pressure daily and record, calling PCP for findings outside established parameters;  Reviewed scheduled/upcoming provider appointments including: 02-29-2024 Advised patient to discuss changes in HTN or heart health with provider; Provided education on prescribed diet heart healthy ;  Discussed complications of poorly controlled blood pressure such as heart disease, stroke, circulatory complications, vision complications, kidney impairment, sexual dysfunction;  Screening for signs and symptoms of depression related to chronic disease state;  Assessed social determinant of health barriers;   Symptom Management: Take medications as prescribed   Attend all scheduled provider appointments Call provider office for new concerns or questions  call the Suicide and Crisis Lifeline: 988 call the [place]  National Suicide Prevention Lifeline: (435) 825-3718 or TTY: 954-845-5984 TTY 513-517-2861) to talk to a trained counselor call 1-800-273-TALK (toll free, 24 hour hotline) if experiencing a Mental Health or Behavioral Health Crisis  check blood pressure weekly learn about high blood pressure call doctor for signs and symptoms of high blood pressure develop an action plan for high blood pressure keep all doctor appointments take medications for blood pressure exactly as prescribed report new symptoms to your doctor  Follow Up Plan: Telephone follow up appointment with care management team member scheduled for: 02-27-2024 at 345 pm       RNCM Care Management Maintain, Monitor and Self-Manage Symptoms of COPD       Current Barriers:  Chronic Disease Management support and education needs related to effective COPD  Planned  Interventions: Provided patient with basic written and verbal COPD education on self care/management/and exacerbation prevention.    Denies any acute exacerbations of her COPD.  Advised patient to track and manage COPD triggers. Provided written and verbal instructions on pursed lip breathing and utilized returned demonstration as teach back Provided instruction about proper use of medications used for management of COPD including inhalers Advised patient to self assesses COPD action plan zone and make appointment with provider if in the yellow zone for 48 hours without improvement Advised patient to engage in light exercise as tolerated 3-5 days a week to aid in the the management of COPD.  Provided education about and advised patient to utilize infection prevention strategies to reduce risk of respiratory infection. The patient is mindful of her surroundings and is safe. The patient knows what worse looks like and to call for changes or needs.  Discussed the importance of adequate rest and management of fatigue with COPD Screening for signs and symptoms of depression related to chronic disease state  Assessed social determinant of health barriers   Symptom Management: Take medications as prescribed   Attend all scheduled provider appointments Call provider office for new concerns or questions  call the Suicide and Crisis Lifeline: 988 call the [place]  National Suicide Prevention Lifeline: 218 607 9508 or TTY: 240-718-8370 TTY (867)763-9337) to talk to a trained counselor call 1-800-273-TALK (toll free, 24 hour hotline) if experiencing a Mental Health or Behavioral Health Crisis  identify and remove indoor air pollutants limit outdoor activity during cold weather listen for public air quality announcements every day develop a rescue plan eliminate symptom triggers at home follow rescue plan if symptoms flare-up  Follow Up Plan: Telephone follow up appointment with care management team  member scheduled for: 02-27-2024 at 345 pm             Consent to Services:  Patient was given information about care management services, agreed to services, and gave verbal consent to participate.   Plan: Telephone follow up appointment with care management team member scheduled for:02-27-2024 at 3:45 pm  Rosina Forte, BSN RN RN Care Manager  [facility]  Ambulatory Care Management  Direct Number: [phone]

## 2023-12-29 NOTE — Patient Instructions (Signed)
 Visit Information  Thank you for taking time to visit with me today. Please don't hesitate to contact me if I can be of assistance to you before our next scheduled telephone appointment.  Following are the goals we discussed today:   Goals Addressed             This Visit's Progress    RNCM Care Management Expected Outcome:  Monitor, Self-Manage and Reduce Symptoms of: Depression, Anxiety, Stress       Current Barriers:  Care Coordination needs related to resources in the community for help with mental health needs and community resources for financial support in a patient with anxiety, depression, and stress Chronic Disease Management support and education needs related to effective management of stress, anxiety, and depression  Planned Interventions: Evaluation of current treatment plan related to stress, anxiety, and depression and patient's adherence to plan as established by provider.  Is working with the LCSW and states that has been helpful.  Reflective listening.  Advised patient to call the office for changes in stress, anxiety, and depression Provided education to patient re: working with the CCM team, support available with resources, ongoing outreaches for effective management of stress, anxiety, and depression Reviewed medications with patient and discussed compliance. The patient is compliant with her medications.   Reviewed scheduled/upcoming provider appointments including 02-29-2024 with PCP Care Guide referral for resources in the community to help with food resources and financial constraints. Will continue to monitor for changes and new programs that may be a resource for the patient.  Social Work referral for ongoing support and education related to effective management of depression, anxiety, stress. The patient has talked with the LCSW has follow up with LCSW 01-11-2024.  Pharmacy referral for ongoing support and education of medication needs and management. Ongoing  support and education from the pharm D. Discussed plans with patient for ongoing care management follow up and provided patient with direct contact information for care management team Advised patient to discuss changes in mood, anxiety, depression, or mental health needs with provider Screening for signs and symptoms of depression related to chronic disease state  Assessed social determinant of health barriers  Symptom Management: Take medications as prescribed   Attend all scheduled provider appointments Call provider office for new concerns or questions  call the Suicide and Crisis Lifeline: 988 call the [place]  National Suicide Prevention Lifeline: 313-012-1262 or TTY: 808-106-6063 TTY 347-599-3840) to talk to a trained counselor call 1-800-273-TALK (toll free, 24 hour hotline) if experiencing a Mental Health or Behavioral Health Crisis   Follow Up Plan: Telephone follow up appointment with care management team member scheduled for:02-27-2024 at 345 pm        RNCM Care Management Expected Outcome:  Monitor, Self-Manage, and Reduce Symptoms of Hypertension       Current Barriers:  Chronic Disease Management support and education needs related to effective management of HTN BP Readings from Last 3 Encounters:  11/24/23 (!) 140/90  08/17/23 130/70  06/08/23 100/70    Planned Interventions: Evaluation of current treatment plan related to hypertension self management and patient's adherence to plan as established by provider. Her blood pressures are stable and she denies any acute changes in her HTN or heart health.  Provided education to patient re: stroke prevention, s/s of heart attack and stroke; Reviewed prescribed diet heart healthy diet. The patient is compliant with a heart healthy diet.  Reviewed medications with patient and discussed importance of compliance. Reports compliance with all medications. Continues  to work with PharmD. Discussed plans with patient for ongoing care  management follow up and provided patient with direct contact information for care management team; Advised patient, providing education and rationale, to monitor blood pressure daily and record, calling PCP for findings outside established parameters;  Reviewed scheduled/upcoming provider appointments including: 02-29-2024 Advised patient to discuss changes in HTN or heart health with provider; Provided education on prescribed diet heart healthy ;  Discussed complications of poorly controlled blood pressure such as heart disease, stroke, circulatory complications, vision complications, kidney impairment, sexual dysfunction;  Screening for signs and symptoms of depression related to chronic disease state;  Assessed social determinant of health barriers;   Symptom Management: Take medications as prescribed   Attend all scheduled provider appointments Call provider office for new concerns or questions  call the Suicide and Crisis Lifeline: 988 call the [place]  National Suicide Prevention Lifeline: 828-367-5097 or TTY: 4244817937 TTY 819-534-8140) to talk to a trained counselor call 1-800-273-TALK (toll free, 24 hour hotline) if experiencing a Mental Health or Behavioral Health Crisis  check blood pressure weekly learn about high blood pressure call doctor for signs and symptoms of high blood pressure develop an action plan for high blood pressure keep all doctor appointments take medications for blood pressure exactly as prescribed report new symptoms to your doctor  Follow Up Plan: Telephone follow up appointment with care management team member scheduled for: 02-27-2024 at 345 pm       RNCM Care Management Maintain, Monitor and Self-Manage Symptoms of COPD       Current Barriers:  Chronic Disease Management support and education needs related to effective COPD  Planned Interventions: Provided patient with basic written and verbal COPD education on self care/management/and exacerbation  prevention.    Denies any acute exacerbations of her COPD.  Advised patient to track and manage COPD triggers. Provided written and verbal instructions on pursed lip breathing and utilized returned demonstration as teach back Provided instruction about proper use of medications used for management of COPD including inhalers Advised patient to self assesses COPD action plan zone and make appointment with provider if in the yellow zone for 48 hours without improvement Advised patient to engage in light exercise as tolerated 3-5 days a week to aid in the the management of COPD.  Provided education about and advised patient to utilize infection prevention strategies to reduce risk of respiratory infection. The patient is mindful of her surroundings and is safe. The patient knows what worse looks like and to call for changes or needs.  Discussed the importance of adequate rest and management of fatigue with COPD Screening for signs and symptoms of depression related to chronic disease state  Assessed social determinant of health barriers   Symptom Management: Take medications as prescribed   Attend all scheduled provider appointments Call provider office for new concerns or questions  call the Suicide and Crisis Lifeline: 988 call the [place]  National Suicide Prevention Lifeline: 606-200-7632 or TTY: 646-285-2705 TTY 705-297-5253) to talk to a trained counselor call 1-800-273-TALK (toll free, 24 hour hotline) if experiencing a Mental Health or Behavioral Health Crisis  identify and remove indoor air pollutants limit outdoor activity during cold weather listen for public air quality announcements every day develop a rescue plan eliminate symptom triggers at home follow rescue plan if symptoms flare-up  Follow Up Plan: Telephone follow up appointment with care management team member scheduled for: 02-27-2024 at 345 pm           Our next  appointment is by telephone on 02-27-2024 at 3:45  pm  Please call the care guide team at [phone] if you need to cancel or reschedule your appointment.   If you are experiencing a Mental Health or Behavioral Health Crisis or need someone to talk to, please call the Suicide and Crisis Lifeline: 988 call the [place]  National Suicide Prevention Lifeline: 228-009-0613 or TTY: (520)182-9098 TTY 512-246-1954) to talk to a trained counselor call 1-800-273-TALK (toll free, 24 hour hotline) call 911   Patient verbalizes understanding of instructions and care plan provided today and agrees to view in MyChart. Active MyChart status and patient understanding of how to access instructions and care plan via MyChart confirmed with patient.     Telephone follow up appointment with care management team member scheduled for:02-27-2024 at 3:45 pm  Rosina Forte, BSN RN RN Care Manager  [facility]  Ambulatory Care Management  Direct Number: [phone]

## 2023-12-30 ENCOUNTER — Other Ambulatory Visit: Payer: Self-pay | Admitting: Family Medicine

## 2023-12-30 DIAGNOSIS — E890 Postprocedural hypothyroidism: Secondary | ICD-10-CM

## 2023-12-30 DIAGNOSIS — C3491 Malignant neoplasm of unspecified part of right bronchus or lung: Secondary | ICD-10-CM

## 2024-01-11 ENCOUNTER — Other Ambulatory Visit: Payer: Self-pay | Admitting: Family Medicine

## 2024-01-11 ENCOUNTER — Ambulatory Visit: Payer: Self-pay | Admitting: Licensed Clinical Social Worker

## 2024-01-11 DIAGNOSIS — G8929 Other chronic pain: Secondary | ICD-10-CM

## 2024-01-11 NOTE — Patient Instructions (Signed)
Visit Information  Thank you for taking time to visit with me today. Please don't hesitate to contact me if I can be of assistance to you.   Following are the goals we discussed today:   Goals Addressed             This Visit's Progress    Care Coordination       Activities and task to complete in order to accomplish goals.   Start / continue relaxed breathing 3 times daily Continue with compliance of taking medication prescribed by Doctor Self Support options  (consider visiting rec center to decrease social isolation )  Consider accepting help from family to help with finances.         Our next appointment is by telephone on 01/25/2024 at 1:15pm  Please call the care guide team at [phone] if you need to cancel or reschedule your appointment.   If you are experiencing a Mental Health or Behavioral Health Crisis or need someone to talk to, please call 911   Patient verbalizes understanding of instructions and care plan provided today and agrees to view in MyChart. Active MyChart status and patient understanding of how to access instructions and care plan via MyChart confirmed with patient.     Telephone follow up appointment with care management team member scheduled for:  Gwyndolyn Saxon MSW, LCSW Licensed Clinical Social Worker  [facility], Population Health Direct Dial: [phone]  Fax: [phone]

## 2024-01-11 NOTE — Telephone Encounter (Signed)
Copied from CRM [id number]. Topic: Clinical - Medication Refill >> Jan 11, 2024  2:33 PM Rona Ravens wrote: Most Recent Primary Care Visit:  Provider: COX, KIRSTEN  Department: [facility]  Visit Type: OFFICE VISIT  Date: 11/24/2023  Medication: Oxycodone HCl 10 MG TABS  Has the patient contacted their pharmacy? Yes (Agent: If no, request that the patient contact the pharmacy for the refill. If patient does not wish to contact the pharmacy document the reason why and proceed with request.) (Agent: If yes, when and what did the pharmacy advise?)  Is this the correct pharmacy for this prescription? Yes If no, delete pharmacy and type the correct one.  This is the patient's preferred pharmacy:  [id number] Rosalita Levan, [place] - [address] AT [facility] [address] [place] [address] Phone: [phone] Fax: [phone]  Has the prescription been filled recently? Yes  Is the patient out of the medication? Yes  Has the patient been seen for an appointment in the last year OR does the patient have an upcoming appointment? Yes  Can we respond through MyChart? Yes  Agent: Please be advised that Rx refills may take up to 3 business days. We ask that you follow-up with your pharmacy.

## 2024-01-11 NOTE — Patient Outreach (Signed)
  Care Coordination   Initial Visit Note   01/11/2024 Name: Victoria Pena MRN: [id number] DOB: August 16, 1951  Victoria Pena is a 73 y.o. year old female who sees Cox, Fritzi Mandes, MD for primary care. I spoke with  Victoria Pena by phone today.  What matters to the patients health and wellness today?  Pt Victoria Pena and LCSW A.  Felton Pena completed initial visit via phone. Pt reports social isolation and depressed mood.     Goals Addressed             This Visit's Progress    Care Coordination       Activities and task to complete in order to accomplish goals.   Start / continue relaxed breathing 3 times daily Continue with compliance of taking medication prescribed by Doctor Self Support options  (consider visiting rec center to decrease social isolation )  Consider accepting help from family to help with finances.         SDOH assessments and interventions completed:  Yes  SDOH Interventions Today    Flowsheet Row Most Recent Value  SDOH Interventions   Food Insecurity Interventions Intervention Not Indicated  Housing Interventions Intervention Not Indicated  Transportation Interventions Intervention Not Indicated  Utilities Interventions Intervention Not Indicated        Care Coordination Interventions:  Yes, provided  Interventions Today    Flowsheet Row Most Recent Value  Chronic Disease   Chronic disease during today's visit Other  [Depression]  Education Interventions   Education Provided Provided Education  Provided Verbal Education On [employer]  [Encourage pt to visit local rec center.]  Mental Health Interventions   Mental Health Discussed/Reviewed Mental Health Discussed, Mental Health Reviewed, Coping Strategies, Depression  [Encouraged pt to decrease social isolation by visiting [employer] program. Engage in interested hobbies such as sewing and quilting. Pt encourage to accept help from family.]  Nutrition Interventions    Nutrition Discussed/Reviewed Nutrition Reviewed  [Pt reports she has food in the home and enoys cooking soup and sandwiches.]       Follow up plan: Follow up call scheduled for 01/25/2024    Encounter Outcome:  Patient Visit Completed   Victoria Pena MSW, LCSW Licensed Clinical Social Worker  [facility], Population Health Direct Dial: [phone]  Fax: [phone]

## 2024-01-12 ENCOUNTER — Other Ambulatory Visit: Payer: Self-pay

## 2024-01-13 MED ORDER — OXYCODONE HCL 10 MG PO TABS: 10.0000 mg | ORAL_TABLET | Freq: Four times a day (QID) | ORAL | 0 refills | Status: DC | PRN

## 2024-01-16 ENCOUNTER — Other Ambulatory Visit: Payer: Self-pay | Admitting: Family Medicine

## 2024-01-16 MED ORDER — FORTEO 600 MCG/2.4ML ~~LOC~~ SOPN: 20.0000 ug | PEN_INJECTOR | Freq: Every day | SUBCUTANEOUS | 1 refills | Status: AC

## 2024-01-23 ENCOUNTER — Telehealth: Payer: Self-pay

## 2024-01-23 DIAGNOSIS — H0520 Unspecified exophthalmos: Secondary | ICD-10-CM | POA: Diagnosis not present

## 2024-01-25 ENCOUNTER — Ambulatory Visit: Payer: Self-pay | Admitting: Licensed Clinical Social Worker

## 2024-01-25 NOTE — Patient Outreach (Signed)
  Care Coordination   Follow Up Visit Note   01/25/2024 Name: Victoria Pena MRN: [id number] DOB: May 08, 1951  Victoria Pena is a 73 y.o. year old female who sees Cox, Kirsten, MD for primary care. I spoke with  Gwenyth [name] Konigsberg by phone today.  What matters to the patients health and wellness today?  Completed follow up with Victoria Pena. Processed emotions and discussed goal setting.     Goals Addressed             This Visit's Progress    Care Coordination       Activities and task to complete in order to accomplish goals.   Start / continue relaxed breathing 3 times daily Continue with compliance of taking medication prescribed by Doctor Self Support options  (consider visiting rec center to decrease social isolation )  Consider accepting help from family to help with finances.  Reviewed existing goals with patient 01/25/2024        SDOH assessments and interventions completed:  No   SDOH completed 01/11/2024 Care Coordination Interventions:  No   No new interventions reviewed current goals  Follow up plan: Follow up call scheduled for 02/08/2024    Encounter Outcome:  Patient Visit Completed   Alfonso Rummer MSW, LCSW Licensed Clinical Social Worker  [facility], Population Health Direct Dial: [phone]  Fax: [phone]

## 2024-01-25 NOTE — Progress Notes (Signed)
 Pharmacy Medication Assistance Program Note    01/25/2024  Patient ID: Victoria Pena, female   DOB: Apr 20, 1951, 73 y.o.   MRN: [id number]     01/23/2024  Outreach [employer]  Manufacturer Medication [employer] Retail Buyer Drugs Other [facility]  Type of Radiographer, Therapeutic Assistance  Date Application Sent to Prescriber 01/23/2024  Name of Prescriber Kirsten Cox  Date Application Received From Provider 01/25/2024  Date Application Submitted to Manufacturer 01/25/2024  Method Application Sent to Manufacturer Fax  Patient Assistance Determination Approved  Approval Start Date 01/25/2024  Approval End Date 12/19/2024  Patient Notification Method Telephone Call  Telephone Call Outcome Left Voicemail     Signature

## 2024-01-25 NOTE — Telephone Encounter (Signed)
 PAP: Patient assistance application for [employer]  has been approved by PAP Companies: [employer] from 01/25/2024 to 12/19/2024. Medication should be delivered to PAP Delivery: Home. For further shipping updates, please contact [employer] at [phone]. Patient ID is: [id number]

## 2024-01-25 NOTE — Patient Instructions (Signed)
 Visit Information  Thank you for taking time to visit with me today. Please don't hesitate to contact me if I can be of assistance to you.   Following are the goals we discussed today:   Goals Addressed             This Visit's Progress    Care Coordination       Activities and task to complete in order to accomplish goals.   Start / continue relaxed breathing 3 times daily Continue with compliance of taking medication prescribed by Doctor Self Support options  (consider visiting rec center to decrease social isolation )  Consider accepting help from family to help with finances.  Reviewed existing goals with patient 01/25/2024        Our next appointment is by telephone on 02/08/2024 at 2:45pm  Please call the care guide team at [phone] if you need to cancel or reschedule your appointment.   If you are experiencing a Mental Health or Behavioral Health Crisis or need someone to talk to, please call 911   Patient verbalizes understanding of instructions and care plan provided today and agrees to view in MyChart. Active MyChart status and patient understanding of how to access instructions and care plan via MyChart confirmed with patient.     Telephone follow up appointment with care management team member scheduled for:02/08/2024  Alfonso Rummer MSW, LCSW Licensed Clinical Social Worker  [facility], Population Health Direct Dial: [phone]  Fax: [phone]

## 2024-01-25 NOTE — Telephone Encounter (Signed)
 PAP: Application for Lynann Sandman has been submitted to [employer], via fax

## 2024-01-31 ENCOUNTER — Encounter: Payer: Medicare HMO | Admitting: Internal Medicine

## 2024-02-01 DIAGNOSIS — S92354A Nondisplaced fracture of fifth metatarsal bone, right foot, initial encounter for closed fracture: Secondary | ICD-10-CM | POA: Diagnosis not present

## 2024-02-07 ENCOUNTER — Other Ambulatory Visit: Payer: Self-pay | Admitting: Family Medicine

## 2024-02-07 DIAGNOSIS — F418 Other specified anxiety disorders: Secondary | ICD-10-CM

## 2024-02-08 ENCOUNTER — Other Ambulatory Visit: Payer: Self-pay | Admitting: Family Medicine

## 2024-02-08 ENCOUNTER — Ambulatory Visit: Payer: Self-pay | Admitting: Licensed Clinical Social Worker

## 2024-02-08 DIAGNOSIS — G8929 Other chronic pain: Secondary | ICD-10-CM

## 2024-02-08 NOTE — Telephone Encounter (Signed)
Copied from CRM [id number]. Topic: Clinical - Medication Refill >> Feb 08, 2024 11:46 AM Higinio Roger wrote: Most Recent Primary Care Visit:  Provider: [facility], [facility]IRSTEN  [facility]  Visit Type: OFFICE VISIT  Date: 11/24/2023  Medication: Oxycodone HCl 10 MG TABS   Has the patient contacted their pharmacy? Yes (Agent: If no, request that the patient contact the pharmacy for the refill. If patient does not wish to contact the pharmacy document the reason why and proceed with request.) (Agent: If yes, when and what did the pharmacy advise?) Patient called on 2/19 and pharmacy stated they could not refill and patient needed to contact clinic.  Is this the correct pharmacy for this prescription? Yes If no, delete pharmacy and type the correct one.  This is the patient's preferred pharmacy:   [id number] Rosalita Levan, [place] - [address] AT [facility] [address] [place] [address] Phone: [phone] Fax: [phone]    Has the prescription been filled recently? No  Is the patient out of the medication? No. Patient has 2 remaining  Has the patient been seen for an appointment in the last year OR does the patient have an upcoming appointment? Yes  Can we respond through MyChart? Yes  Agent: Please be advised that Rx refills may take up to 3 business days. We ask that you follow-up with your pharmacy.

## 2024-02-08 NOTE — Patient Instructions (Signed)
Visit Information  Thank you for taking time to visit with me today. Please don't hesitate to contact me if I can be of assistance to you.   Following are the goals we discussed today:   Goals Addressed             This Visit's Progress    Care Coordination       Activities and task to complete in order to accomplish goals.   Start / continue relaxed breathing 3 times daily Continue with compliance of taking medication prescribed by Doctor Self Support options  (consider visiting rec center to decrease social isolation )  Consider accepting help from family to help with finances.  Reviewed existing goals with patient 02/08/2024         Our next appointment is by telephone on 02/22/2024 at 1pm  Please call the care guide team at [phone] if you need to cancel or reschedule your appointment.   If you are experiencing a Mental Health or Behavioral Health Crisis or need someone to talk to, please call 911   Patient verbalizes understanding of instructions and care plan provided today and agrees to view in MyChart. Active MyChart status and patient understanding of how to access instructions and care plan via MyChart confirmed with patient.     Telephone follow up appointment with care management team member scheduled for:02/22/2024  Gwyndolyn Saxon MSW, LCSW Licensed Clinical Social Worker  [facility], Population Health Direct Dial: [phone]  Fax: [phone]

## 2024-02-08 NOTE — Telephone Encounter (Signed)
Last OV: 11/24/23 Refills left: 0 Last refilled: 01/13/24 Patient states to have 2 tablets left

## 2024-02-08 NOTE — Patient Outreach (Signed)
  Care Coordination   Follow Up Visit Note   02/08/2024 Name: Victoria Pena MRN: [id number] DOB: 1951/10/30  Victoria Pena is a 73 y.o. year old female who sees Cox, Kirsten, MD for primary care. I spoke with  Victoria Pena by phone today.  What matters to the patients health and wellness today?  Pt reports improvement in mood. Victoria Pena and LCSW Victoria Pena will continue with implementing additional coping strategies.     Goals Addressed             This Visit's Progress    Care Coordination       Activities and task to complete in order to accomplish goals.   Start / continue relaxed breathing 3 times daily Continue with compliance of taking medication prescribed by Doctor Self Support options  (consider visiting rec center to decrease social isolation )  Consider accepting help from family to help with finances.  Reviewed existing goals with patient 02/08/2024         SDOH assessments and interventions completed:  Yes  SDOH Interventions Today    Flowsheet Row Most Recent Value  SDOH Interventions   Food Insecurity Interventions Intervention Not Indicated  Housing Interventions Intervention Not Indicated  Transportation Interventions Intervention Not Indicated  Utilities Interventions Intervention Not Indicated        Care Coordination Interventions:  Yes, provided  Interventions Today    Flowsheet Row Most Recent Value  Mental Health Interventions   Mental Health Discussed/Reviewed Mental Health Reviewed, Coping Strategies, Depression  [Pt reports improvement in mood with daughter temporarily staying with Victoria Pena to help. Coping strategies such as new hobbies, communicating needs with family has helped.]       Follow up plan: Follow up call scheduled for 02/22/2024    Encounter Outcome:  Patient Visit Completed   Victoria Pena MSW, LCSW Licensed Clinical Social Worker  [facility], Population Health Direct Dial:  [phone]  Fax: [phone]

## 2024-02-10 ENCOUNTER — Other Ambulatory Visit: Payer: Self-pay

## 2024-02-11 MED ORDER — OXYCODONE HCL 10 MG PO TABS: 10.0000 mg | ORAL_TABLET | Freq: Four times a day (QID) | ORAL | 0 refills | Status: DC | PRN

## 2024-02-13 ENCOUNTER — Ambulatory Visit: Payer: Medicare HMO

## 2024-02-16 ENCOUNTER — Other Ambulatory Visit: Payer: Self-pay | Admitting: Family Medicine

## 2024-02-22 ENCOUNTER — Telehealth: Payer: Self-pay

## 2024-02-22 ENCOUNTER — Ambulatory Visit: Payer: Self-pay | Admitting: Licensed Clinical Social Worker

## 2024-02-22 ENCOUNTER — Other Ambulatory Visit: Payer: Self-pay | Admitting: Family Medicine

## 2024-02-22 DIAGNOSIS — F064 Anxiety disorder due to known physiological condition: Secondary | ICD-10-CM

## 2024-02-22 NOTE — Patient Instructions (Signed)
 Visit Information  Thank you for taking time to visit with me today. Please don't hesitate to contact me if I can be of assistance to you.   Following are the goals we discussed today:   Goals Addressed             This Visit's Progress    Care Coordination       Activities and task to complete in order to accomplish goals.   Start / continue relaxed breathing 3 times daily Continue with compliance of taking medication prescribed by Doctor Self Support options  (consider visiting rec center to decrease social isolation )  Consider accepting help from family to help with finances.  Reviewed existing goals with patient 02/22/2024         Our next appointment is by telephone on 03/07/2024 at 11am  Please call the care guide team at [phone] if you need to cancel or reschedule your appointment.   If you are experiencing a Mental Health or Behavioral Health Crisis or need someone to talk to, please call 911   Patient verbalizes understanding of instructions and care plan provided today and agrees to view in MyChart. Active MyChart status and patient understanding of how to access instructions and care plan via MyChart confirmed with patient.     Telephone follow up appointment with care management team member scheduled for:03/07/2024  Gwyndolyn Saxon MSW, LCSW Licensed Clinical Social Worker  [facility], Population Health Direct Dial: [phone]  Fax: [phone]

## 2024-02-22 NOTE — Telephone Encounter (Signed)
 Approved for 0.5 mg tablets till 12/18/2024.

## 2024-02-22 NOTE — Patient Outreach (Signed)
 Care Coordination   Follow Up Visit Note   02/22/2024 Name: Victoria Pena MRN: [id number] DOB: [birth year]  Victoria Pena is a 73 y.o. year old female who sees Cox, Kirsten, MD for primary care. I spoke with  Victoria Pena by phone today.  What matters to the patients health and wellness today?  Victoria Pena reports slight increase of stress and depression due to ex-spouse is critically ill. Victoria Pena reports and LCSW Victoria Pena discuss the importance of self care and processing through current emotions.     Goals Addressed             This Visit's Progress    Care Coordination       Activities and task to complete in order to accomplish goals.   Start / continue relaxed breathing 3 times daily Continue with compliance of taking medication prescribed by Doctor Self Support options  (consider visiting rec center to decrease social isolation )  Consider accepting help from family to help with finances.  Reviewed existing goals with patient 02/22/2024         SDOH assessments and interventions completed:  Yes  SDOH Interventions Today    Flowsheet Row Most Recent Value  SDOH Interventions   Food Insecurity Interventions Intervention Not Indicated  Housing Interventions Intervention Not Indicated  Transportation Interventions Intervention Not Indicated  Utilities Interventions Intervention Not Indicated        Care Coordination Interventions:  Yes, provided  Interventions Today    Flowsheet Row Most Recent Value  Mental Health Interventions   Mental Health Discussed/Reviewed Mental Health Discussed, Mental Health Reviewed, Coping Strategies, Depression  [Victoria Pena reports slight increase of stress and depression due to ex-spouse is critically ill.]       Follow up plan: Follow up call scheduled for 03/07/2024    Encounter Outcome:  Patient Visit Completed   Gwyndolyn Saxon MSW, LCSW Licensed Clinical Social Worker  [facility],  Population Health Direct Dial: [phone]  Fax: [phone]

## 2024-02-22 NOTE — Telephone Encounter (Unsigned)
 Copied from CRM [id number]. Topic: Clinical - Medication Refill >> Feb 22, 2024 10:37 AM Shelah Lewandowsky wrote: Most Recent Primary Care Visit:  Provider: [facility]  [facility]  Visit Type: OFFICE VISIT  Date: 11/24/2023  Medication: ALPRAZolam (XANAX) 0.5 MG tablet  Has the patient contacted their pharmacy? Yes (Agent: If no, request that the patient contact the pharmacy for the refill. If patient does not wish to contact the pharmacy document the reason why and proceed with request.) (Agent: If yes, when and what did the pharmacy advise?)  Is this the correct pharmacy for this prescription? Yes If no, delete pharmacy and type the correct one.  This is the patient's preferred pharmacy:  [facility] #[id number] Rosalita Levan, [place] - [address] AT [facility] [address] [place] [address] Phone: [phone] Fax: [phone]    Has the prescription been filled recently? Yes  Is the patient out of the medication? Yes  Has the patient been seen for an appointment in the last year OR does the patient have an upcoming appointment? Yes  Can we respond through MyChart? Yes  Agent: Please be advised that Rx refills may take up to 3 business days. We ask that you follow-up with your pharmacy.

## 2024-02-23 MED ORDER — ALPRAZOLAM 0.5 MG PO TABS: 0.5000 mg | ORAL_TABLET | Freq: Two times a day (BID) | ORAL | 2 refills | Status: DC | PRN

## 2024-02-27 ENCOUNTER — Other Ambulatory Visit: Payer: Self-pay | Admitting: *Deleted

## 2024-02-27 ENCOUNTER — Other Ambulatory Visit: Payer: Self-pay | Admitting: Family Medicine

## 2024-02-27 DIAGNOSIS — F418 Other specified anxiety disorders: Secondary | ICD-10-CM

## 2024-02-27 DIAGNOSIS — I10 Essential (primary) hypertension: Secondary | ICD-10-CM

## 2024-02-27 NOTE — Patient Outreach (Signed)
 Care Management   Visit Note  02/27/2024 Name: Victoria Pena MRN: [id number] DOB: 1951-01-23  Subjective: Victoria Pena is a 73 y.o. year old female who is a primary care patient of Cox, Kirsten, MD. The Care Management team was consulted for assistance.      Engaged with patient spoke with patient by telephone.    Goals Addressed             This Visit's Progress    RNCM Care Management Expected Outcome:  Monitor, Self-Manage and Reduce Symptoms of: Depression, Anxiety, Stress       Current Barriers:  Care Coordination needs related to resources in the community for help with mental health needs and community resources for financial support in a patient with anxiety, depression, and stress Chronic Disease Management support and education needs related to effective management of stress, anxiety, and depression  Planned Interventions: Evaluation of current treatment plan related to stress, anxiety, and depression and patient's adherence to plan as established by provider.  Is working with the LCSW and states that has been helpful.  Reflective listening.  Advised patient to call the office for changes in stress, anxiety, and depression Provided education to patient re: working with the CCM team, support available with resources, ongoing outreaches for effective management of stress, anxiety, and depression Reviewed medications with patient and discussed compliance. The patient is compliant with her medications.   Reviewed scheduled/upcoming provider appointments including 02-29-2024 with PCP Care Guide referral for resources in the community to help with food resources and financial constraints. Will continue to monitor for changes and new programs that may be a resource for the patient.  Social Work referral for ongoing support and education related to effective management of depression, anxiety, stress.  Pharmacy referral for ongoing support and education of medication needs and  management. Ongoing support and education from the pharm D. Discussed plans with patient for ongoing care management follow up and provided patient with direct contact information for care management team Advised patient to discuss changes in mood, anxiety, depression, or mental health needs with provider Screening for signs and symptoms of depression related to chronic disease state  Assessed social determinant of health barriers  Symptom Management: Take medications as prescribed   Attend all scheduled provider appointments Call provider office for new concerns or questions  call the Suicide and Crisis Lifeline: 988 call the [place] National Suicide Prevention Lifeline: 952-505-9948 or TTY: 307 633 4717 TTY 952-300-3299) to talk to a trained counselor call 1-800-273-TALK (toll free, 24 hour hotline) if experiencing a Mental Health or Behavioral Health Crisis   Follow Up Plan: Telephone follow up appointment with care management team member scheduled for:03-26-2024 at 315 pm        RNCM Care Management Expected Outcome:  Monitor, Self-Manage, and Reduce Symptoms of Hypertension       Current Barriers:  Chronic Disease Management support and education needs related to effective management of HTN BP Readings from Last 3 Encounters:  11/24/23 (!) 140/90  08/17/23 130/70  06/08/23 100/70    Planned Interventions: Evaluation of current treatment plan related to hypertension self management and patient's adherence to plan as established by provider. Her blood pressures are stable and she denies any acute changes in her HTN or heart health.  Provided education to patient re: stroke prevention, s/s of heart attack and stroke; Education and support Reviewed prescribed diet heart healthy diet. The patient is compliant with a heart healthy diet.  Reviewed medications with patient and discussed  importance of compliance. Reports compliance with all medications. Continues to work with  PharmD. Discussed plans with patient for ongoing care management follow up and provided patient with direct contact information for care management team; Advised patient, providing education and rationale, to monitor blood pressure daily and record, calling PCP for findings outside established parameters;  Reviewed scheduled/upcoming provider appointments including: 02-29-2024 Advised patient to discuss changes in HTN or heart health with provider; Provided education on prescribed diet heart healthy ;  Discussed complications of poorly controlled blood pressure such as heart disease, stroke, circulatory complications, vision complications, kidney impairment, sexual dysfunction;  Screening for signs and symptoms of depression related to chronic disease state;  Assessed social determinant of health barriers;   Symptom Management: Take medications as prescribed   Attend all scheduled provider appointments Call provider office for new concerns or questions  call the Suicide and Crisis Lifeline: 988 call the [place] National Suicide Prevention Lifeline: 513-033-9328 or TTY: (901) 513-2297 TTY (646)887-5413) to talk to a trained counselor call 1-800-273-TALK (toll free, 24 hour hotline) if experiencing a Mental Health or Behavioral Health Crisis  check blood pressure weekly learn about high blood pressure call doctor for signs and symptoms of high blood pressure develop an action plan for high blood pressure keep all doctor appointments take medications for blood pressure exactly as prescribed report new symptoms to your doctor  Follow Up Plan: Telephone follow up appointment with care management team member scheduled for: 03-26-2024 at 315 pm       RNCM Care Management Maintain, Monitor and Self-Manage Symptoms of COPD       Current Barriers:  Chronic Disease Management support and education needs related to effective COPD  Planned Interventions: Provided patient with basic written and verbal  COPD education on self care/management/and exacerbation prevention.    Denies any acute exacerbations of her COPD.  Advised patient to track and manage COPD triggers. Provided written and verbal instructions on pursed lip breathing and utilized returned demonstration as teach back Provided instruction about proper use of medications used for management of COPD including inhalers Advised patient to self assesses COPD action plan zone and make appointment with provider if in the yellow zone for 48 hours without improvement Advised patient to engage in light exercise as tolerated 3-5 days a week to aid in the the management of COPD.  Provided education about and advised patient to utilize infection prevention strategies to reduce risk of respiratory infection. The patient is mindful of her surroundings and is safe. The patient knows what worse looks like and to call for changes or needs.  Discussed the importance of adequate rest and management of fatigue with COPD Screening for signs and symptoms of depression related to chronic disease state  Assessed social determinant of health barriers   Symptom Management: Take medications as prescribed   Attend all scheduled provider appointments Call provider office for new concerns or questions  call the Suicide and Crisis Lifeline: 988 call the [place] National Suicide Prevention Lifeline: 954-736-2383 or TTY: 331-593-3550 TTY (609)517-8002) to talk to a trained counselor call 1-800-273-TALK (toll free, 24 hour hotline) if experiencing a Mental Health or Behavioral Health Crisis  identify and remove indoor air pollutants limit outdoor activity during cold weather listen for public air quality announcements every day develop a rescue plan eliminate symptom triggers at home follow rescue plan if symptoms flare-up  Follow Up Plan: Telephone follow up appointment with care management team member scheduled for: 03-26-2024 at 315 pm  Consent  to Services:  Patient was given information about care management services, agreed to services, and gave verbal consent to participate.   Plan: Telephone follow up appointment with care management team member scheduled for:03-26-2024 at 3:15 pm  Larey Brick, BSN RN [facility], [facility] Health RN Care Manager Direct Dial: [phone]  Fax: [phone]

## 2024-02-27 NOTE — Patient Instructions (Signed)
 Visit Information  Thank you for taking time to visit with me today. Please don't hesitate to contact me if I can be of assistance to you before our next scheduled telephone appointment.  Following are the goals we discussed today:   Goals Addressed             This Visit's Progress    RNCM Care Management Expected Outcome:  Monitor, Self-Manage and Reduce Symptoms of: Depression, Anxiety, Stress       Current Barriers:  Care Coordination needs related to resources in the community for help with mental health needs and community resources for financial support in a patient with anxiety, depression, and stress Chronic Disease Management support and education needs related to effective management of stress, anxiety, and depression  Planned Interventions: Evaluation of current treatment plan related to stress, anxiety, and depression and patient's adherence to plan as established by provider.  Is working with the LCSW and states that has been helpful.  Reflective listening.  Advised patient to call the office for changes in stress, anxiety, and depression Provided education to patient re: working with the CCM team, support available with resources, ongoing outreaches for effective management of stress, anxiety, and depression Reviewed medications with patient and discussed compliance. The patient is compliant with her medications.   Reviewed scheduled/upcoming provider appointments including 02-29-2024 with PCP Care Guide referral for resources in the community to help with food resources and financial constraints. Will continue to monitor for changes and new programs that may be a resource for the patient.  Social Work referral for ongoing support and education related to effective management of depression, anxiety, stress.  Pharmacy referral for ongoing support and education of medication needs and management. Ongoing support and education from the pharm D. Discussed plans with patient for  ongoing care management follow up and provided patient with direct contact information for care management team Advised patient to discuss changes in mood, anxiety, depression, or mental health needs with provider Screening for signs and symptoms of depression related to chronic disease state  Assessed social determinant of health barriers  Symptom Management: Take medications as prescribed   Attend all scheduled provider appointments Call provider office for new concerns or questions  call the Suicide and Crisis Lifeline: 988 call the [place] National Suicide Prevention Lifeline: 902-411-1784 or TTY: 281-874-1680 TTY 705-382-1139) to talk to a trained counselor call 1-800-273-TALK (toll free, 24 hour hotline) if experiencing a Mental Health or Behavioral Health Crisis   Follow Up Plan: Telephone follow up appointment with care management team member scheduled for:03-26-2024 at 315 pm        RNCM Care Management Expected Outcome:  Monitor, Self-Manage, and Reduce Symptoms of Hypertension       Current Barriers:  Chronic Disease Management support and education needs related to effective management of HTN BP Readings from Last 3 Encounters:  11/24/23 (!) 140/90  08/17/23 130/70  06/08/23 100/70    Planned Interventions: Evaluation of current treatment plan related to hypertension self management and patient's adherence to plan as established by provider. Her blood pressures are stable and she denies any acute changes in her HTN or heart health.  Provided education to patient re: stroke prevention, s/s of heart attack and stroke; Education and support Reviewed prescribed diet heart healthy diet. The patient is compliant with a heart healthy diet.  Reviewed medications with patient and discussed importance of compliance. Reports compliance with all medications. Continues to work with PharmD. Discussed plans with patient for ongoing  care management follow up and provided patient with direct  contact information for care management team; Advised patient, providing education and rationale, to monitor blood pressure daily and record, calling PCP for findings outside established parameters;  Reviewed scheduled/upcoming provider appointments including: 02-29-2024 Advised patient to discuss changes in HTN or heart health with provider; Provided education on prescribed diet heart healthy ;  Discussed complications of poorly controlled blood pressure such as heart disease, stroke, circulatory complications, vision complications, kidney impairment, sexual dysfunction;  Screening for signs and symptoms of depression related to chronic disease state;  Assessed social determinant of health barriers;   Symptom Management: Take medications as prescribed   Attend all scheduled provider appointments Call provider office for new concerns or questions  call the Suicide and Crisis Lifeline: 988 call the [place] National Suicide Prevention Lifeline: 314 556 2473 or TTY: (617)817-1170 TTY (934)582-5568) to talk to a trained counselor call 1-800-273-TALK (toll free, 24 hour hotline) if experiencing a Mental Health or Behavioral Health Crisis  check blood pressure weekly learn about high blood pressure call doctor for signs and symptoms of high blood pressure develop an action plan for high blood pressure keep all doctor appointments take medications for blood pressure exactly as prescribed report new symptoms to your doctor  Follow Up Plan: Telephone follow up appointment with care management team member scheduled for: 03-26-2024 at 315 pm       RNCM Care Management Maintain, Monitor and Self-Manage Symptoms of COPD       Current Barriers:  Chronic Disease Management support and education needs related to effective COPD  Planned Interventions: Provided patient with basic written and verbal COPD education on self care/management/and exacerbation prevention.    Denies any acute exacerbations of her  COPD.  Advised patient to track and manage COPD triggers. Provided written and verbal instructions on pursed lip breathing and utilized returned demonstration as teach back Provided instruction about proper use of medications used for management of COPD including inhalers Advised patient to self assesses COPD action plan zone and make appointment with provider if in the yellow zone for 48 hours without improvement Advised patient to engage in light exercise as tolerated 3-5 days a week to aid in the the management of COPD.  Provided education about and advised patient to utilize infection prevention strategies to reduce risk of respiratory infection. The patient is mindful of her surroundings and is safe. The patient knows what worse looks like and to call for changes or needs.  Discussed the importance of adequate rest and management of fatigue with COPD Screening for signs and symptoms of depression related to chronic disease state  Assessed social determinant of health barriers   Symptom Management: Take medications as prescribed   Attend all scheduled provider appointments Call provider office for new concerns or questions  call the Suicide and Crisis Lifeline: 988 call the [place] National Suicide Prevention Lifeline: (479) 405-6905 or TTY: 276-491-1268 TTY 234-817-6862) to talk to a trained counselor call 1-800-273-TALK (toll free, 24 hour hotline) if experiencing a Mental Health or Behavioral Health Crisis  identify and remove indoor air pollutants limit outdoor activity during cold weather listen for public air quality announcements every day develop a rescue plan eliminate symptom triggers at home follow rescue plan if symptoms flare-up  Follow Up Plan: Telephone follow up appointment with care management team member scheduled for: 03-26-2024 at 315 pm           Our next appointment is by telephone on 03-26-2024 at 3:15 pm  Please call the care guide team at [phone] if you  need to cancel or reschedule your appointment.   If you are experiencing a Mental Health or Behavioral Health Crisis or need someone to talk to, please call the Suicide and Crisis Lifeline: 988 call the [place] National Suicide Prevention Lifeline: 858-818-8017 or TTY: 616-755-3678 TTY (431)846-1599) to talk to a trained counselor call 1-800-273-TALK (toll free, 24 hour hotline)   Patient verbalizes understanding of instructions and care plan provided today and agrees to view in MyChart. Active MyChart status and patient understanding of how to access instructions and care plan via MyChart confirmed with patient.     Telephone follow up appointment with care management team member scheduled for:03-26-2024 at 3:15 pm  Larey Brick, BSN RN [facility], [facility] RN Care Manager Direct Dial: [phone]  Fax: [phone]

## 2024-02-29 ENCOUNTER — Ambulatory Visit: Payer: Medicare HMO | Admitting: Family Medicine

## 2024-03-02 DIAGNOSIS — S92354D Nondisplaced fracture of fifth metatarsal bone, right foot, subsequent encounter for fracture with routine healing: Secondary | ICD-10-CM | POA: Diagnosis not present

## 2024-03-07 ENCOUNTER — Ambulatory Visit: Payer: Self-pay | Admitting: Licensed Clinical Social Worker

## 2024-03-07 NOTE — Patient Outreach (Signed)
 Care Coordination   Follow Up Visit Note   03/07/2024 Name: Victoria Pena MRN: [id number] DOB: 1951-09-10  Victoria Pena is a 73 y.o. year old female who sees Cox, Kirsten, MD for primary care. I spoke with  Victoria Pena by phone today.  What matters to the patients health and wellness today?  Follow up completed with Victoria Pena. Victoria Pena reports stress with ex spouse current hospital admission    Goals Addressed             This Visit's Progress    Care Coordination       Activities and task to complete in order to accomplish goals.   Start / continue relaxed breathing 3 times daily Continue with compliance of taking medication prescribed by Doctor Self Support options  (consider visiting rec center to decrease social isolation )  Consider accepting help from family to help with finances.  Reviewed existing goals with patient 03/07/2024         SDOH assessments and interventions completed:  Yes  SDOH Interventions Today    Flowsheet Row Most Recent Value  SDOH Interventions   Food Insecurity Interventions Intervention Not Indicated  Housing Interventions Intervention Not Indicated  Utilities Interventions Intervention Not Indicated        Care Coordination Interventions:  Yes, provided  Interventions Today    Flowsheet Row Most Recent Value  Mental Health Interventions   Mental Health Discussed/Reviewed Mental Health Reviewed, Coping Strategies  [Processed emotions regarding the health of ex spouse]       Follow up plan: Follow up call scheduled for 03/21/2024    Encounter Outcome:  Patient Visit Completed   Victoria Pena MSW, LCSW Licensed Clinical Social Worker  [facility], Population Health Direct Dial: [phone]  Fax: [phone]

## 2024-03-07 NOTE — Patient Instructions (Addendum)
 Visit Information  Thank you for taking time to visit with me today. Please don't hesitate to contact me if I can be of assistance to you.   Following are the goals we discussed today:   Goals Addressed             This Visit's Progress    Care Coordination       Activities and task to complete in order to accomplish goals.   Start / continue relaxed breathing 3 times daily Continue with compliance of taking medication prescribed by Doctor Self Support options  (consider visiting rec center to decrease social isolation )  Consider accepting help from family to help with finances.  Reviewed existing goals with patient 03/07/2024         Our next appointment is by telephone on 03/21/2024 at 130pm / Please call the care guide team at [phone] if you need to cancel or eschedule your appointment.   If you are experiencing a Mental Health or Behavioral Health Crisis or need someone to talk to, please call 911   Patient verbalizes understanding of instructions and care plan provided today and agrees to view in MyChart. Active MyChart status and patient understanding of how to access instructions and care plan via MyChart confirmed with patient.     The patient has been provided with contact information for the care management team and has been advised to call with any health related questions or concerns.   Gwyndolyn Saxon MSW, LCSW Licensed Clinical Social Worker  [facility] Population Health Direct Dial: [phone]  Fax: [phone]

## 2024-03-08 ENCOUNTER — Other Ambulatory Visit: Payer: Self-pay | Admitting: Family Medicine

## 2024-03-08 DIAGNOSIS — M545 Low back pain, unspecified: Secondary | ICD-10-CM

## 2024-03-08 NOTE — Telephone Encounter (Unsigned)
 Copied from CRM [id number]. Topic: Clinical - Medication Refill >> Mar 08, 2024  8:06 AM Antwanette L wrote: Most Recent Primary Care Visit:  Provider: [facility], KIRSTEN  Department[facility]  Visit Type: OFFICE VISIT  Date: 11/24/2023  Medication: Oxycodone HCl 10 MG TABS  Has the patient contacted their pharmacy? Yes  Is this the correct pharmacy for this prescription? Yes This is the patient's preferred pharmacy:  [id number] Rosalita Levan, [place] - [address] AT [facility] [address] [place] [address] Phone: [phone] Fax: [phone]     Has the prescription been filled recently? No  Is the patient out of the medication? Yes  Has the patient been seen for an appointment in the last year OR does the patient have an upcoming appointment? Yes  Can we respond through MyChart? No. Contact patient by phone at [phone]  Agent: Please be advised that Rx refills may take up to 3 business days. We ask that you follow-up with your pharmacy.

## 2024-03-09 MED ORDER — OXYCODONE HCL 10 MG PO TABS: 10.0000 mg | ORAL_TABLET | Freq: Four times a day (QID) | ORAL | 0 refills | Status: DC | PRN

## 2024-03-11 ENCOUNTER — Other Ambulatory Visit: Payer: Self-pay | Admitting: Family Medicine

## 2024-03-11 DIAGNOSIS — M797 Fibromyalgia: Secondary | ICD-10-CM

## 2024-03-21 ENCOUNTER — Telehealth: Payer: Self-pay | Admitting: *Deleted

## 2024-03-21 ENCOUNTER — Encounter: Payer: Self-pay | Admitting: Licensed Clinical Social Worker

## 2024-03-21 NOTE — Progress Notes (Signed)
 Complex Care Management Care Guide Note  03/21/2024 Name: Victoria Pena MRN: [id number] DOB: Jan 05, 1951  Victoria Pena is a 73 y.o. year old female who is a primary care patient of Cox, Kirsten, MD and is actively engaged with the care management team. I reached out to Matilde Sprang by phone today to assist with  canceling    with the Licensed Clinical Child psychotherapist.  Follow up plan:  The care guide will reach out to the patient again.  Gwenevere Ghazi  [facility]  [facility], [facility] Guide  Direct Dial: [phone]  Fax [phone]

## 2024-03-26 ENCOUNTER — Other Ambulatory Visit: Payer: Self-pay

## 2024-03-26 ENCOUNTER — Telehealth: Payer: Self-pay

## 2024-03-26 NOTE — Progress Notes (Signed)
 Complex Care Management Note Care Guide Note  03/26/2024 Name: Concetta Guion MRN: [id number] DOB: 11-Jul-1951   Complex Care Management Outreach Attempts: An unsuccessful telephone outreach was attempted today to offer the patient information about available complex care management services.  Follow Up Plan: Patient scheduled with RNCM Salome Arnt for today 03/26/24 at 3:15 p.m.   [facility]  [facility], [facility] Care Management Assistant Direct Dial: [phone]  Fax: [phone]

## 2024-03-28 ENCOUNTER — Telehealth: Payer: Self-pay

## 2024-03-28 NOTE — Telephone Encounter (Signed)
-----   Message from Nurse Eula Fried sent at 03/26/2024  3:45 PM EDT ----- No answer. Please contact to reschedule with RNCM Ruel Favors.

## 2024-03-30 NOTE — Progress Notes (Signed)
 Complex Care Management Note  Care Guide Note 03/30/2024 Name: Victoria Pena MRN: [id number] DOB: 1951-02-17  [name] is a 73 y.o. year old female who sees Cox, Kirsten, MD for primary care. I reached out to Marko Stai Searcy by phone today to offer complex care management services.  [name] was given information about Complex Care Management services today including:   The Complex Care Management services include support from the care team which includes your Nurse Care Manager, Clinical Social Worker, or Pharmacist.  The Complex Care Management team is here to help remove barriers to the health concerns and goals most important to you. Complex Care Management services are voluntary, and the patient may decline or stop services at any time by request to their care team member.   Complex Care Management Consent Status: Patient agreed to services and verbal consent obtained.   Follow up plan:  Telephone appointment with complex care management team member scheduled for:  04/03/24 at 1:00 p.m.   Encounter Outcome:  Patient Scheduled  [facility]  [facility], [facility] Care Management Assistant Direct Dial: [phone]  Fax: [phone]

## 2024-03-31 ENCOUNTER — Other Ambulatory Visit: Payer: Self-pay | Admitting: Family Medicine

## 2024-03-31 DIAGNOSIS — E890 Postprocedural hypothyroidism: Secondary | ICD-10-CM

## 2024-03-31 DIAGNOSIS — C3491 Malignant neoplasm of unspecified part of right bronchus or lung: Secondary | ICD-10-CM

## 2024-04-03 ENCOUNTER — Other Ambulatory Visit: Payer: Self-pay

## 2024-04-03 NOTE — Patient Instructions (Signed)
 Visit Information  Thank you for taking time to visit with me today. Please don't hesitate to contact me if I can be of assistance to you before our next scheduled appointment.  Our next appointment is by telephone on 04/19/24 at 11:00 Please call the care guide team at [phone] if you need to cancel or reschedule your appointment.   Following is a copy of your care plan:   Goals Addressed             This Visit's Progress    VBCI RN Care Plan       Problems:  Chronic Disease Management support and education needs related to COPD, Depression, and HTN  Goal: Over the next 3 months the Patient will attend all scheduled medical appointments: 04/17/24 with Dr. Reinhold Carbine as evidenced by chart review and patient report        continue to work with RN Care Manager and/or Social Worker to address care management and care coordination needs related to COPD, Depression, and HTN as evidenced by adherence to CM Team Scheduled appointments     demonstrate ongoing self health care management ability taking all medications as prescribed, attending all medical appintments and contacting providers office for questions or changes in health status as evidenced by     take all medications exactly as prescribed and will call provider for medication related questions as evidenced by patient report and chart review     Interventions:   COPD Interventions: Advised patient to track and manage COPD triggers Provided instruction about proper use of medications used for management of COPD including inhalers Advised patient to self assesses COPD action plan zone and make appointment with provider if in the yellow zone for 48 hours without improvement Advised patient to engage in light exercise as tolerated 3-5 days a week to aid in the the management of COPD Provided education about and advised patient to utilize infection prevention strategies to reduce risk of respiratory infection Discussed the importance of  adequate rest and management of fatigue with COPD   Hypertension Interventions: Last practice recorded BP readings:  BP Readings from Last 3 Encounters:  04/03/24 127/84  11/24/23 (!) 140/90  08/17/23 130/70   Most recent eGFR/CrCl:  Lab Results  Component Value Date   EGFR 90 11/24/2023    No components found for: "CRCL"  Evaluation of current treatment plan related to hypertension self management and patient's adherence to plan as established by provider Provided education to patient re: stroke prevention, s/s of heart attack and stroke Reviewed medications with patient and discussed importance of compliance Discussed plans with patient for ongoing care management follow up and provided patient with direct contact information for care management team Advised patient, providing education and rationale, to monitor blood pressure daily and record, calling PCP for findings outside established parameters Reviewed scheduled/upcoming provider appointments including:  Advised patient to discuss higher than average blood pressure readings with provider  Patient Self-Care Activities:  Attend all scheduled provider appointments Attend church or other social activities Call provider office for new concerns or questions  Perform all self care activities independently  Perform IADL's (shopping, preparing meals, housekeeping, managing finances) independently Take medications as prescribed   Work with the social worker to address care coordination needs and will continue to work with the clinical team to address health care and disease management related needs  Plan:  Telephone follow up appointment with care management team member scheduled for:  04/19/24 at 11:00 Message sent to [facility]  Guide to reschedule patient with LCSW for counseling support for depression and anxiety             Please call the Suicide and Crisis Lifeline: 988 call the [place]  National Suicide Prevention  Lifeline: 305-753-4062 or TTY: 815-407-1315 TTY 5856408012) to talk to a trained counselor call 1-800-273-TALK (toll free, 24 hour hotline) if you are experiencing a Mental Health or Behavioral Health Crisis or need someone to talk to.  Patient verbalizes understanding of instructions and care plan provided today and agrees to view in MyChart. Active MyChart status and patient understanding of how to access instructions and care plan via MyChart confirmed with patient.      Clarnce Crow BSN RN CCM [facility]  [facility], [facility] Health RN Care Manager Direct Dial: [phone] Fax: [phone]

## 2024-04-03 NOTE — Patient Outreach (Signed)
 Complex Care Management   Visit Note  04/03/2024  Name:  Victoria Pena MRN: [id number] DOB: 10-25-1951  Situation: Referral received for Complex Care Management related to COPD and HTN, Depression/Anxiety  I obtained verbal consent from Patient.  Visit completed with patient  on the phone  Background:   Past Medical History:  Diagnosis Date   Acquired spondylolisthesis 05/12/2015   Adenocarcinoma of right lung, stage 1 (HCC) 07/26/2016   Anxiety    Arthritis    Arthropathy of lumbar facet joint 04/11/2019   Formatting of this note might be different from the original. Added automatically from request for surgery [id number]   Atherosclerosis of aorta (HCC) 07/08/2021   Benign essential hypertension 12/27/2016   BMI 30.0-30.9,adult 12/23/2020   COPD (chronic obstructive pulmonary disease) (HCC)    pt denies   Current mild episode of major depressive disorder (HCC) 03/24/2020   Daytime sleepiness 08/03/2021   Depression    Fibromyalgia 03/24/2020   Gastroesophageal reflux disease without esophagitis 03/24/2020   Graves disease 12/27/2016   Hypertension    Osteoporosis 08/03/2021   Pericardial cyst 02/02/2021   Postoperative hypothyroidism 01/17/2015   Reactive airways dysfunction syndrome, unspecified asthma severity, uncomplicated (HCC) 09/06/2015   S/P lobectomy of lung 07/26/2015   Formatting of this note might be different from the original. Right upper   S/P lumbar spinal fusion 05/11/2018   Thyroid disease    Toe ulcer, left, limited to breakdown of skin (HCC) 12/23/2020    Assessment: Patient Reported Symptoms:  Cognitive Cognitive Status: Alert and oriented to person, place, and time      Neurological    No reported symptoms  HEENT HEENT Symptoms Reported: No symptoms reported HEENT Conditions: Vision problem(s) Vision Problems: cataract(s) (planning surgery  upcoming optometrist appointment) HEENT Management Strategies: Medication therapy, Coping  strategies Vision problem(s)  Cardiovascular Cardiovascular Symptoms Reported: No symptoms reported Cardiovascular Conditions: Hypertension Cardiovascular Management Strategies: Medication therapy Weight: 180 lb (81.6 kg) Cardiovascular Self-Management Outcome: 4 (good)  Respiratory Respiratory Symptoms Reported: No symptoms reported Additional Respiratory Details: has inhaler on hand, will request new order during provider visit 04/17/24 Respiratory Conditions: COPD Respiratory Self-Management Outcome: 4 (good)  Endocrine Patient reports the following symptoms related to hypoglycemia or hyperglycemia : No symptoms reported    Gastrointestinal Gastrointestinal Symptoms Reported: No symptoms reported Additional Gastrointestinal Details: GERD treated with medication      Genitourinary    No reported symptoms  Integumentary Integumentary Symptoms Reported: No symptoms reported    Musculoskeletal Musculoskelatal Symptoms Reviewed: Difficulty walking, Muscle pain, Unsteady gait Additional Musculoskeletal Details: hx of lumbar fusion, fibromyalgia Musculoskeletal Conditions: Back pain, Joint pain Musculoskeletal Management Strategies: Adequate rest, Medication therapy, Weight management, Diet modification Falls in the past year?: No    Psychosocial Psychosocial Symptoms Reported: Depression - if selected complete PHQ 2-9, Anxiety - if selected complete GAD Behavioral Health Conditions: Anxiety, Depression Major Change/Loss/Stressor/Fears (CP): Death of a loved one, Medical condition, self Techniques to Cope with Loss/Stress/Change: Counseling Quality of Family Relationships: helpful, involved, supportive Do you feel physically threatened by others?: No      11/24/2023    1:50 PM  Depression screen PHQ 2/9  Decreased Interest 2  Down, Depressed, Hopeless 1  PHQ - 2 Score 3  Altered sleeping 3  Tired, decreased energy 2  Change in appetite 3  Feeling bad or failure about yourself  1   Trouble concentrating 2  Moving slowly or fidgety/restless 0  Suicidal thoughts 0  PHQ-9 Score 14  Difficult  doing work/chores Somewhat difficult    Vitals:   04/03/24 1332  BP: 127/84    Medications Reviewed Today     Reviewed by Clarnce Crow, RN (Registered Nurse) on 04/03/24 at 1339  Med List Status: <None>   Medication Order Taking? Sig Documenting Provider Last Dose Status Informant  ALPRAZolam (XANAX) 0.5 MG tablet [id number] Yes Take 1 tablet (0.5 mg total) by mouth 2 (two) times daily as needed for anxiety. Cox, Kirsten, MD Taking Active   atorvastatin (LIPITOR) 40 MG tablet [id number] Yes TAKE 1 TABLET(40 MG) BY MOUTH DAILY Cox, Kirsten, MD Taking Active   buPROPion (WELLBUTRIN XL) 300 MG 24 hr tablet [id number] Yes TAKE 1 TABLET(300 MG) BY MOUTH DAILY Cox, Kirsten, MD Taking Active   Calcium Carb-Cholecalciferol (CALCIUM 500 + D3) 500-5 MG-MCG TABS [id number] Yes Take 3 tablets by mouth daily.  Patient taking differently: Take 1 tablet by mouth in the morning and at bedtime.   Audie Bleacher, MD Taking Active Self  calcium carbonate (TUMS - DOSED IN MG ELEMENTAL CALCIUM) 500 MG chewable tablet [id number] Yes Chew 1 tablet by mouth 2 (two) times daily as needed for indigestion or heartburn. [provider] Taking Active Self  celecoxib (CELEBREX) 200 MG capsule [id number] Yes TAKE 1 CAPSULE(200 MG) BY MOUTH DAILY Cox, Kirsten, MD Taking Active   EPINEPHrine 0.3 mg/0.3 mL IJ SOAJ injection [id number] Yes Inject 0.3 mg into the muscle as needed for anaphylaxis. Allegra Arch, NP Taking Active Self  escitalopram (LEXAPRO) 10 MG tablet [id number]  TAKE 1 TABLET(10 MG) BY MOUTH DAILY Cox, Kirsten, MD  Active   famotidine (PEPCID) 40 MG tablet [id number] Yes TAKE 1 TABLET(40 MG) BY MOUTH DAILY Cox, Kirsten, MD Taking Active   FORTEO 600 MCG/2.4ML SOPN [id number] Yes Inject 20 mcg into the skin daily. Cox, Kirsten, MD Taking Active   Insulin Pen Needle 32G X 4 MM MISC  [id number] Yes 1 each by Does not apply route daily. Cox, Kirsten, MD Taking Active   levothyroxine (SYNTHROID) 125 MCG tablet [id number] Yes TAKE 1 TABLET BY MOUTH DAILY BEFORE BREAKFAST Cox, Kirsten, MD Taking Active   losartan (COZAAR) 50 MG tablet [id number] Yes TAKE 1 TABLET(50 MG) BY MOUTH DAILY Cox, Kirsten, MD Taking Active   metroNIDAZOLE (METROGEL) 0.75 % gel [id number] Yes Apply 1 application topically 2 (two) times daily.  Patient taking differently: Apply 1 application  topically 2 (two) times daily as needed (rosacea).   Audie Bleacher, MD Taking Active Self  Multiple Vitamin (MULTIVITAMIN WITH MINERALS) TABS tablet [id number] Yes Take 1 tablet by mouth in the morning and at bedtime. [provider] Taking Active Self  mupirocin ointment (BACTROBAN) 2 % [id number] No Apply 1 Application topically 2 (two) times daily.  Patient not taking: Reported on 04/03/2024   Audie Bleacher, MD Not Taking Active Self           Med Note Burley Carpenter, Berlene Dixson   Tue Apr 03, 2024  1:39 PM) Patient completed course  Oxycodone HCl 10 MG TABS [id number] Yes Take 1 tablet (10 mg total) by mouth 4 (four) times daily as needed (severe pain). Cox, Kirsten, MD Taking Active   pantoprazole (PROTONIX) 40 MG tablet [id number] Yes TAKE 1 TABLET EVERY DAY Cox, Kirsten, MD Taking Active   pregabalin (LYRICA) 150 MG capsule [id number] Yes TAKE 1 CAPSULE(150 MG) BY MOUTH TWICE DAILY Cox, Kirsten, MD Taking Active   trolamine salicylate (ASPERCREME) 10 % cream [id number] Yes Apply 1 Application topically as needed for  muscle pain. [provider] Taking Active Self  Vitamin D, Ergocalciferol, (DRISDOL) 1.25 MG (50000 UNIT) CAPS capsule [id number] Yes TAKE 1 CAPSULE BY MOUTH 2 TIMES A WEEK Sirivol, Mamatha, MD Taking Active             Recommendation:   Request sent to [facility] Care Guide to please contact patient to schedule appointment with LCSW  Follow Up Plan:   Telephone follow up appointment  date/time:  04/19/24 at 11:00   Clarnce Crow BSN RN CCM [place]  [facility], [facility] RN Care Manager Direct Dial: [phone] Fax: [phone]

## 2024-04-05 ENCOUNTER — Telehealth: Payer: Self-pay

## 2024-04-05 NOTE — Telephone Encounter (Signed)
-----   Message from Nurse Joetta Mustache sent at 04/03/2024  2:05 PM EDT ----- Hello  Can you please call patient to schedule an appointment with LCSW?  She has already been working with this patient, but missed last appt.   Thank you!   Clarnce Crow BSN RN CCM [place]  [facility], [facility] Health RN Care Manager Direct Dial: [phone] Fax:[phone]

## 2024-04-09 ENCOUNTER — Telehealth: Payer: Self-pay | Admitting: Family Medicine

## 2024-04-09 DIAGNOSIS — G8929 Other chronic pain: Secondary | ICD-10-CM

## 2024-04-09 DIAGNOSIS — M545 Low back pain, unspecified: Secondary | ICD-10-CM

## 2024-04-09 MED ORDER — OXYCODONE HCL 10 MG PO TABS: 10.0000 mg | ORAL_TABLET | Freq: Four times a day (QID) | ORAL | 0 refills | Status: DC | PRN

## 2024-04-09 NOTE — Telephone Encounter (Signed)
 Copied from CRM [id number]. Topic: Clinical - Medication Refill >> Apr 09, 2024  8:33 AM Georgeann Kindred wrote: Most Recent Primary Care Visit:  Provider: COX, KIRSTEN  Department: [facility]  Visit Type: OFFICE VISIT  Date: 11/24/2023  Medication: Oxycodone  HCl 10 MG TABS  Has the patient contacted their pharmacy? Yes (Agent: If no, request that the patient contact the pharmacy for the refill. If patient does not wish to contact the pharmacy document the reason why and proceed with request.) (Agent: If yes, when and what did the pharmacy advise?) Advised to call the provider's office   Is this the correct pharmacy for this prescription? Yes If no, delete pharmacy and type the correct one.  This is the patient's preferred pharmacy:  [id number] Georgeana Kindler, [place] - [address] AT [facility] [address] [place] [address] Phone: [phone] Fax: [phone]   Has the prescription been filled recently? No  Is the patient out of the medication? Yes  Has the patient been seen for an appointment in the last year OR does the patient have an upcoming appointment? Yes  Can we respond through MyChart? Yes  Agent: Please be advised that Rx refills may take up to 3 business days. We ask that you follow-up with your pharmacy.

## 2024-04-12 NOTE — Progress Notes (Deleted)
 Complex Care Management Note Care Guide Note  04/12/2024 Name: Victoria Pena MRN: [id number] DOB: [birth year]   Complex Care Management Outreach Attempts: An unsuccessful outreach was attempted for an appointment today.  Follow Up Plan:  Additional outreach attempts will be made to offer the patient complex care management information and services.   Encounter Outcome:  No Answer  [facility]  [facility], [facility] Health Care Management Assistant Direct Dial: [phone]  Fax: [phone]

## 2024-04-12 NOTE — Progress Notes (Signed)
 Complex Care Management Note Care Guide Note  04/12/2024 Name: Victoria Pena MRN: [id number] DOB: 1951/01/03   Complex Care Management Outreach Attempts: An unsuccessful outreach was attempted for an appointment today.  Follow Up Plan:  No further outreach attempts will be made at this time. We have been unable to contact the patient to offer or enroll patient in complex care management services.  Encounter Outcome:  No Answer  [facility]  [facility], [facility] Care Management Assistant Direct Dial: [phone]  Fax: [phone]

## 2024-04-12 NOTE — Progress Notes (Signed)
 Complex Care Management Note  Care Guide Note 04/12/2024 Name: Victoria Pena MRN: [id number] DOB: 02/26/1951  Victoria Pena is a 73 y.o. year old female who sees Cox, Kirsten, MD for primary care. I reached out to Victoria Pena by phone today to offer complex care management services.  Victoria Pena was given information about Complex Care Management services today including:   The Complex Care Management services include support from the care team which includes your Nurse Care Manager, Clinical Social Worker, or Pharmacist.  The Complex Care Management team is here to help remove barriers to the health concerns and goals most important to you. Complex Care Management services are voluntary, and the patient may decline or stop services at any time by request to their care team member.   Complex Care Management Consent Status: Patient did not agree to participate in complex care management services at this time.  Follow up plan:  Patient declined to reschedule with LCSW as she now will be back working and will follow up as needed.  [facility]  [facility], [facility] Health Care Management Assistant Direct Dial: [phone]  Fax: [phone]

## 2024-04-15 ENCOUNTER — Other Ambulatory Visit: Payer: Self-pay | Admitting: Family Medicine

## 2024-04-17 ENCOUNTER — Encounter: Payer: Self-pay | Admitting: Family Medicine

## 2024-04-17 ENCOUNTER — Other Ambulatory Visit: Payer: Self-pay

## 2024-04-17 ENCOUNTER — Ambulatory Visit: Admitting: Family Medicine

## 2024-04-17 VITALS — BP 136/84 | HR 83 | Temp 97.9°F | Ht 65.0 in | Wt 182.0 lb

## 2024-04-17 DIAGNOSIS — M545 Low back pain, unspecified: Secondary | ICD-10-CM | POA: Diagnosis not present

## 2024-04-17 DIAGNOSIS — E890 Postprocedural hypothyroidism: Secondary | ICD-10-CM | POA: Diagnosis not present

## 2024-04-17 DIAGNOSIS — C3491 Malignant neoplasm of unspecified part of right bronchus or lung: Secondary | ICD-10-CM

## 2024-04-17 DIAGNOSIS — I700 Atherosclerosis of aorta: Secondary | ICD-10-CM

## 2024-04-17 DIAGNOSIS — K219 Gastro-esophageal reflux disease without esophagitis: Secondary | ICD-10-CM

## 2024-04-17 DIAGNOSIS — J432 Centrilobular emphysema: Secondary | ICD-10-CM

## 2024-04-17 DIAGNOSIS — E782 Mixed hyperlipidemia: Secondary | ICD-10-CM

## 2024-04-17 DIAGNOSIS — I10 Essential (primary) hypertension: Secondary | ICD-10-CM | POA: Diagnosis not present

## 2024-04-17 DIAGNOSIS — N3000 Acute cystitis without hematuria: Secondary | ICD-10-CM

## 2024-04-17 DIAGNOSIS — M810 Age-related osteoporosis without current pathological fracture: Secondary | ICD-10-CM

## 2024-04-17 DIAGNOSIS — G8929 Other chronic pain: Secondary | ICD-10-CM

## 2024-04-17 DIAGNOSIS — F322 Major depressive disorder, single episode, severe without psychotic features: Secondary | ICD-10-CM

## 2024-04-17 DIAGNOSIS — R3915 Urgency of urination: Secondary | ICD-10-CM | POA: Diagnosis not present

## 2024-04-17 LAB — POCT URINALYSIS DIP (CLINITEK)
Bilirubin, UA: NEGATIVE
Glucose, UA: NEGATIVE mg/dL
Ketones, POC UA: NEGATIVE mg/dL
Nitrite, UA: NEGATIVE
POC PROTEIN,UA: NEGATIVE
Spec Grav, UA: 1.025 (ref 1.010–1.025)
Urobilinogen, UA: 0.2 U/dL
pH, UA: 6 (ref 5.0–8.0)

## 2024-04-17 MED ORDER — OXYCODONE HCL 10 MG PO TABS: 10.0000 mg | ORAL_TABLET | Freq: Three times a day (TID) | ORAL | Status: DC | PRN

## 2024-04-17 MED ORDER — DOXYCYCLINE HYCLATE 100 MG PO TABS: 100.0000 mg | ORAL_TABLET | Freq: Two times a day (BID) | ORAL | 0 refills | Status: DC

## 2024-04-17 NOTE — Assessment & Plan Note (Signed)
Well controlled.  No changes to medicines. Losartan 50 mg daily Continue to work on eating a healthy diet and exercise.  Labs drawn today.

## 2024-04-17 NOTE — Patient Instructions (Addendum)
 You can request enrollment in mom's meals with [employer].  The food assistance is handled directly through [employer].  You may also contact [employer] [phone] for their food pantry. You can also apply for food assistance thru the county at [email or web address]://[place].[email or web address]/ or you can call her local dss office and apply over the phone.

## 2024-04-17 NOTE — Assessment & Plan Note (Signed)
Continue forteo

## 2024-04-17 NOTE — Assessment & Plan Note (Signed)
 Continue pantoprazole  40 mg daily and famotidine  40 mg daily.

## 2024-04-17 NOTE — Assessment & Plan Note (Signed)
 Stable. On no medicines.

## 2024-04-17 NOTE — Assessment & Plan Note (Addendum)
 Continue atorvastatin  40 mg before bed.

## 2024-04-17 NOTE — Assessment & Plan Note (Signed)
TSH and free T4 level will be checked today Continue synthroid 125 mcg

## 2024-04-17 NOTE — Progress Notes (Signed)
 Subjective:  Patient ID: Victoria Pena, female    DOB: December 17, 1951  Age: 73 y.o. MRN: [id number]  Chief Complaint  Patient presents with   Medical Management of Chronic Issues    HPI: Depression/anxiety: Patient is taking wellbutrin  300 mg daily, lexapro  10 mg daily, xanax  0.5 mg BID PRN. Patient does wish to change medications or need counseling. Returned to work with [employer]. 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 Started last week. She feels like this will help give her purpose as well as extra money to pay for essentials.   Hyperlipidemia:  Currently taking Atorvastatin  40 mg daily and needs aspirin  81 mg once daily.   Vitamin D  defiency: ran out of vitamin D  due to cost.  Prescription was too expensive.   Hypertension: Losartan  50 mg daily.  Fibromyalgia: Lyrica  150 mg twice daily.  Chronic pain syndrome due to back pain: On Oxycodone  10 mg TID. On celebrex  200 mg once daily.   Hypothyroidism: Levothyroidism 125 mcg daily  GERD: pantoprazole  40 mg daily and famotidine  40 mg daily.   Osteoporosis: on forteo  daily.        04/17/2024    1:40 PM 11/24/2023    1:50 PM 10/06/2023    1:21 PM 08/17/2023    8:37 AM 06/08/2023    3:20 PM  Depression screen PHQ 2/9  Decreased Interest 2 2 2 2 3   Down, Depressed, Hopeless 2 1 2 2 3   PHQ - 2 Score 4 3 4 4 6   Altered sleeping 3 3 2 2 3   Tired, decreased energy 2 2 2 2 3   Change in appetite 3 3 2 2 3   Feeling bad or failure about yourself  1 1 0 0 0  Trouble concentrating 3 2 2 2 2   Moving slowly or fidgety/restless 3 0 0 0 0  Suicidal thoughts 0 0 0 0 0  PHQ-9 Score 19 14 12 12 17   Difficult doing work/chores Not difficult at all Somewhat difficult Somewhat difficult Somewhat difficult Extremely dIfficult        04/03/2024    1:49 PM  Fall Risk   Falls in the past year? 0    Patient Care Team: Mercy Stall, MD as PCP - General (Family Medicine) Fletcher Humble, LCSW as Social Worker (Licensed Equities trader)   Review of Systems  Constitutional:  Negative for chills, fatigue and fever.  HENT:  Negative for congestion, ear pain and sore throat.   Respiratory:  Negative for cough and shortness of breath.   Cardiovascular:  Negative for chest pain.  Gastrointestinal:  Negative for abdominal pain, constipation, diarrhea, nausea and vomiting.  Genitourinary:  Negative for dysuria and urgency.  Musculoskeletal:  Positive for back pain. Negative for arthralgias and myalgias.  Skin:  Negative for rash.  Neurological:  Negative for dizziness and headaches.  Psychiatric/Behavioral:  Positive for dysphoric mood and sleep disturbance. The patient is nervous/anxious.     Current Outpatient Medications on File Prior to Visit  Medication Sig Dispense Refill   ALPRAZolam  (XANAX ) 0.5 MG tablet Take 1 tablet (0.5 mg total) by mouth 2 (two) times daily as needed for anxiety. 60 tablet 2   atorvastatin  (LIPITOR) 40 MG tablet TAKE 1 TABLET(40 MG) BY MOUTH DAILY 90 tablet 0   buPROPion  (WELLBUTRIN  XL) 300 MG 24 hr tablet TAKE 1 TABLET(300 MG) BY MOUTH DAILY 90 tablet 0   Calcium  Carb-Cholecalciferol  (CALCIUM  500 + D3) 500-5 MG-MCG TABS Take 3 tablets by mouth daily. (  Patient taking differently: Take 1 tablet by mouth in the morning and at bedtime.) 100 tablet 5   calcium  carbonate (TUMS - DOSED IN MG ELEMENTAL CALCIUM ) 500 MG chewable tablet Chew 1 tablet by mouth 2 (two) times daily as needed for indigestion or heartburn.     celecoxib  (CELEBREX ) 200 MG capsule TAKE 1 CAPSULE(200 MG) BY MOUTH DAILY 90 capsule 0   EPINEPHrine  0.3 mg/0.3 mL IJ SOAJ injection Inject 0.3 mg into the muscle as needed for anaphylaxis. 1 each 1   escitalopram  (LEXAPRO ) 10 MG tablet TAKE 1 TABLET(10 MG) BY MOUTH DAILY 90 tablet 1   famotidine  (PEPCID ) 40 MG tablet TAKE 1 TABLET(40 MG) BY MOUTH DAILY 90 tablet 1   FORTEO  600 MCG/2.4ML SOPN Inject 20 mcg into the skin daily. 216 mL 1   Insulin  Pen Needle 32G X 4 MM MISC 1 each by  Does not apply route daily. 100 each 3   losartan  (COZAAR ) 50 MG tablet TAKE 1 TABLET(50 MG) BY MOUTH DAILY 90 tablet 1   metroNIDAZOLE  (METROGEL ) 0.75 % gel Apply 1 application topically 2 (two) times daily. (Patient taking differently: Apply 1 application  topically 2 (two) times daily as needed (rosacea).) 45 g 3   Multiple Vitamin (MULTIVITAMIN WITH MINERALS) TABS tablet Take 1 tablet by mouth in the morning and at bedtime.     pantoprazole  (PROTONIX ) 40 MG tablet TAKE 1 TABLET EVERY DAY 90 tablet 3   pregabalin  (LYRICA ) 150 MG capsule TAKE 1 CAPSULE(150 MG) BY MOUTH TWICE DAILY 180 capsule 1   trolamine salicylate (ASPERCREME) 10 % cream Apply 1 Application topically as needed for muscle pain.     Vitamin D , Ergocalciferol , (DRISDOL ) 1.25 MG (50000 UNIT) CAPS capsule TAKE 1 CAPSULE BY MOUTH 2 TIMES A WEEK 24 capsule 0   No current facility-administered medications on file prior to visit.   Past Medical History:  Diagnosis Date   Acquired spondylolisthesis 05/12/2015   Adenocarcinoma of right lung, stage 1 (HCC) 07/26/2016   Anxiety    Arthritis    Arthropathy of lumbar facet joint 04/11/2019   Formatting of this note might be different from the original. Added automatically from request for surgery [id number]   Atherosclerosis of aorta (HCC) 07/08/2021   Benign essential hypertension 12/27/2016   BMI 30.0-30.9,adult 12/23/2020   COPD (chronic obstructive pulmonary disease) (HCC)    pt denies   Current mild episode of major depressive disorder (HCC) 03/24/2020   Daytime sleepiness 08/03/2021   Depression    Fibromyalgia 03/24/2020   Gastroesophageal reflux disease without esophagitis 03/24/2020   Graves disease 12/27/2016   Hypertension    Osteoporosis 08/03/2021   Pericardial cyst 02/02/2021   Postoperative hypothyroidism 01/17/2015   Reactive airways dysfunction syndrome, unspecified asthma severity, uncomplicated (HCC) 09/06/2015   S/P lobectomy of lung 07/26/2015   Formatting  of this note might be different from the original. Right upper   S/P lumbar spinal fusion 05/11/2018   Thyroid  disease    Toe ulcer, left, limited to breakdown of skin (HCC) 12/23/2020   Past Surgical History:  Procedure Laterality Date   ANKLE SURGERY     bilateral   APPENDECTOMY     BREAST BIOPSY Left 02/25/2020   BREAST BIOPSY Right 08/17/2018   BREAST EXCISIONAL BIOPSY Right    unsure when but marked with scar marker   CHOLECYSTECTOMY     EYE MUSCLE SURGERY Bilateral    4-5 years ago   LAMINECTOMY WITH POSTERIOR LATERAL ARTHRODESIS LEVEL 2 N/A 01/21/2023  Procedure: Laminectomy  - L1-L2 - L2-L3 with non-instrumented posterior lateral fusion;  Surgeon: Isadora Mar, MD;  Location: [facility] OR;  Service: Neurosurgery;  Laterality: N/A;   LUMBAR FUSION  2019   L4, 5, 6   REPLACEMENT TOTAL KNEE Left    THYROIDECTOMY      Family History  Problem Relation Age of Onset   Cancer Mother    Cancer Father    Cancer Brother    Autism Grandson    Breast cancer Neg Hx    Social History   Socioeconomic History   Marital status: Widowed    Spouse name: Not on file   Number of children: Not on file   Years of education: Not on file   Highest education level: GED or equivalent  Occupational History   Not on file  Tobacco Use   Smoking status: Former    Current packs/day: 0.00    Average packs/day: 1.5 packs/day for 35.0 years (52.5 ttl pk-yrs)    Types: Cigarettes    Start date: 07/26/1964    Quit date: 07/27/1999    Years since quitting: 24.7   Smokeless tobacco: Never  Vaping Use   Vaping status: Never Used  Substance and Sexual Activity   Alcohol use: No   Drug use: No   Sexual activity: Not Currently  Other Topics Concern   Not on file  Social History Narrative   Not on file   Social Drivers of Health   Financial Resource Strain: High Risk (11/20/2023)   Overall Financial Resource Strain (CARDIA)    Difficulty of Paying Living Expenses: Hard  Food Insecurity: No Food  Insecurity (03/07/2024)   Hunger Vital Sign    Worried About Running Out of Food in the Last Year: Never true    Ran Out of Food in the Last Year: Never true  Transportation Needs: No Transportation Needs (03/07/2024)   PRAPARE - Administrator, Civil Service (Medical): No    Lack of Transportation (Non-Medical): No  Physical Activity: Sufficiently Active (11/20/2023)   Exercise Vital Sign    Days of Exercise per Week: 4 days    Minutes of Exercise per Session: 40 min  Recent Concern: Physical Activity - Insufficiently Active (10/05/2023)   Exercise Vital Sign    Days of Exercise per Week: 2 days    Minutes of Exercise per Session: 30 min  Stress: Stress Concern Present (11/20/2023)   [employer] of Occupational Health - Occupational Stress Questionnaire    Feeling of Stress : To some extent  Social Connections: Moderately Isolated (11/20/2023)   Social Connection and Isolation Panel [NHANES]    Frequency of Communication with Friends and Family: More than three times a week    Frequency of Social Gatherings with Friends and Family: Once a week    Attends Religious Services: Never    Database administrator or Organizations: Yes    Attends Banker Meetings: Never    Marital Status: Widowed    Objective:  BP 136/84   Pulse 83   Temp 97.9 F (36.6 C)   Ht 5\' 5"  (1.651 m)   Wt 182 lb (82.6 kg)   LMP  (LMP Unknown)   SpO2 93%   BMI 30.29 kg/m      04/17/2024    1:38 PM 04/03/2024    1:49 PM 04/03/2024    1:32 PM  BP/Weight  Systolic BP 136  191  Diastolic BP 84  84  Wt. (Lbs)  182 180   BMI 30.29 kg/m2 29.95 kg/m2     Physical Exam Vitals reviewed.  Constitutional:      Appearance: Normal appearance. She is normal weight.  Neck:     Vascular: No carotid bruit.  Cardiovascular:     Rate and Rhythm: Normal rate and regular rhythm.     Heart sounds: Normal heart sounds.  Pulmonary:     Effort: Pulmonary effort is normal. No respiratory  distress.     Breath sounds: Normal breath sounds.  Abdominal:     General: Abdomen is flat. Bowel sounds are normal.     Palpations: Abdomen is soft.     Tenderness: There is no abdominal tenderness.  Neurological:     Mental Status: She is alert and oriented to person, place, and time.  Psychiatric:        Mood and Affect: Mood normal.        Behavior: Behavior normal.     Diabetic Foot Exam - Simple   No data filed      Lab Results  Component Value Date   WBC 5.2 04/17/2024   HGB 12.2 04/17/2024   HCT 38.9 04/17/2024   PLT 252 04/17/2024   GLUCOSE 99 04/17/2024   CHOL 128 04/17/2024   TRIG 66 04/17/2024   HDL 74 04/17/2024   LDLCALC 40 04/17/2024   ALT 17 04/17/2024   AST 18 04/17/2024   NA 144 04/17/2024   K 4.0 04/17/2024   CL 104 04/17/2024   CREATININE 0.69 04/17/2024   BUN 10 04/17/2024   CO2 25 04/17/2024   TSH 0.059 (L) 04/17/2024   INR 1.0 12/31/2022      Assessment & Plan:  Benign essential hypertension Assessment & Plan: Well controlled.  No changes to medicines. Losartan  50 mg daily.  Continue to work on eating a healthy diet and exercise.  Labs drawn today.   Orders: -     Comprehensive metabolic panel with GFR -     CBC with Differential/Platelet  Atherosclerosis of aorta (HCC) Assessment & Plan: Continue atorvastatin  40 mg before bed.   Centrilobular emphysema (HCC) Assessment & Plan: Stable. On no medicines.    Gastroesophageal reflux disease without esophagitis Assessment & Plan: Continue pantoprazole  40 mg daily and famotidine  40 mg daily.    Postoperative hypothyroidism Assessment & Plan: TSH and free T4 level will be checked today Continue synthroid  125 mcg   Orders: -     TSH  Mixed hyperlipidemia Assessment & Plan: Well controlled.  No changes to medicines. Currently taking Atorvastatin  40 mg daily.  Continue to work on eating a healthy diet and exercise.     Orders: -     Lipid panel  Age-related  osteoporosis without current pathological fracture Assessment & Plan: Continue forteo .   Orders: -     VITAMIN D  25 Hydroxy (Vit-D Deficiency, Fractures)  Chronic midline low back pain without sciatica -     oxyCODONE  HCl; Take 1 tablet (10 mg total) by mouth 3 (three) times daily as needed (severe back pain).  Acute cystitis without hematuria Assessment & Plan: Start on doxycycline . Culture sent.   Orders: -     POCT URINALYSIS DIP (CLINITEK) -     Urine Culture  Moderately severe major depression (HCC) Assessment & Plan: The current medical regimen is fairly effective;  continue present plan and medications.    Adenocarcinoma of right lung, stage 1 (HCC) Assessment & Plan: Management per specialist.  Meds ordered this encounter  Medications   Oxycodone  HCl 10 MG TABS    Sig: Take 1 tablet (10 mg total) by mouth 3 (three) times daily as needed (severe back pain).    Orders Placed This Encounter  Procedures   Urine Culture   Comprehensive metabolic panel with GFR   CBC with Differential/Platelet   Lipid panel   VITAMIN D  25 Hydroxy (Vit-D Deficiency, Fractures)   TSH   POCT URINALYSIS DIP (CLINITEK)     Follow-up: Return in about 3 months (around 07/17/2024) for chronic follow up.   Gladys Lamp I Leal-Borjas,acting as a scribe for Mercy Stall, MD.,have documented all relevant documentation on the behalf of Mercy Stall, MD,as directed by  Mercy Stall, MD while in the presence of Mercy Stall, MD.   Total time spent on today's visit was 40 minutes, including both face-to-face time and nonface-to-face time personally spent on review of chart (labs and imaging), discussing labs and goals, discussing further work-up, treatment options, referrals to specialist if needed, reviewing outside records of pertinent, answering patient's questions, and coordinating care.  An After Visit Summary was printed and given to the patient.  I attest that I have reviewed this visit  and agree with the plan scribed by my staff.   Mercy Stall, MD [facility] [phone]

## 2024-04-17 NOTE — Assessment & Plan Note (Signed)
Well controlled.  No changes to medicines. Currently taking Atorvastatin 40 mg daily.  Continue to work on eating a healthy diet and exercise.

## 2024-04-18 ENCOUNTER — Other Ambulatory Visit: Payer: Self-pay | Admitting: Family Medicine

## 2024-04-18 ENCOUNTER — Encounter: Payer: Self-pay | Admitting: Family Medicine

## 2024-04-18 DIAGNOSIS — C3491 Malignant neoplasm of unspecified part of right bronchus or lung: Secondary | ICD-10-CM

## 2024-04-18 DIAGNOSIS — E890 Postprocedural hypothyroidism: Secondary | ICD-10-CM

## 2024-04-18 LAB — LIPID PANEL
Chol/HDL Ratio: 1.7 ratio (ref 0.0–4.4)
Cholesterol, Total: 128 mg/dL (ref 100–199)
HDL: 74 mg/dL (ref >39)
LDL Chol Calc (NIH): 40 mg/dL (ref 0–99)
Triglycerides: 66 mg/dL (ref 0–149)
VLDL Cholesterol Cal: 14 mg/dL (ref 5–40)

## 2024-04-18 LAB — COMPREHENSIVE METABOLIC PANEL WITH GFR
ALT: 17 IU/L (ref 0–32)
AST: 18 IU/L (ref 0–40)
Albumin: 4 g/dL (ref 3.8–4.8)
Alkaline Phosphatase: 149 IU/L — ABNORMAL HIGH (ref 44–121)
BUN/Creatinine Ratio: 14 (ref 12–28)
BUN: 10 mg/dL (ref 8–27)
Bilirubin Total: 0.7 mg/dL (ref 0.0–1.2)
CO2: 25 mmol/L (ref 20–29)
Calcium: 9.5 mg/dL (ref 8.7–10.3)
Chloride: 104 mmol/L (ref 96–106)
Creatinine, Ser: 0.69 mg/dL (ref 0.57–1.00)
Globulin, Total: 2.4 g/dL (ref 1.5–4.5)
Glucose: 99 mg/dL (ref 70–99)
Potassium: 4 mmol/L (ref 3.5–5.2)
Sodium: 144 mmol/L (ref 134–144)
Total Protein: 6.4 g/dL (ref 6.0–8.5)
eGFR: 92 mL/min/{1.73_m2} (ref >59)

## 2024-04-18 LAB — CBC WITH DIFFERENTIAL/PLATELET
Basophils Absolute: 0 10*3/uL (ref 0.0–0.2)
Basos: 1 %
EOS (ABSOLUTE): 0.1 10*3/uL (ref 0.0–0.4)
Eos: 2 %
Hematocrit: 38.9 % (ref 34.0–46.6)
Hemoglobin: 12.2 g/dL (ref 11.1–15.9)
Immature Grans (Abs): 0 10*3/uL (ref 0.0–0.1)
Immature Granulocytes: 0 %
Lymphocytes Absolute: 1.3 10*3/uL (ref 0.7–3.1)
Lymphs: 25 %
MCH: 27.2 pg (ref 26.6–33.0)
MCHC: 31.4 g/dL — ABNORMAL LOW (ref 31.5–35.7)
MCV: 87 fL (ref 79–97)
Monocytes Absolute: 0.6 10*3/uL (ref 0.1–0.9)
Monocytes: 12 %
Neutrophils Absolute: 3.2 10*3/uL (ref 1.4–7.0)
Neutrophils: 60 %
Platelets: 252 10*3/uL (ref 150–450)
RBC: 4.49 x10E6/uL (ref 3.77–5.28)
RDW: 14.5 % (ref 11.7–15.4)
WBC: 5.2 10*3/uL (ref 3.4–10.8)

## 2024-04-18 LAB — TSH: TSH: 0.059 u[IU]/mL — ABNORMAL LOW (ref 0.450–4.500)

## 2024-04-18 LAB — VITAMIN D 25 HYDROXY (VIT D DEFICIENCY, FRACTURES): Vit D, 25-Hydroxy: 42.2 ng/mL (ref 30.0–100.0)

## 2024-04-18 MED ORDER — LEVOTHYROXINE SODIUM 112 MCG PO TABS: 112.0000 ug | ORAL_TABLET | Freq: Every day | ORAL | 0 refills | Status: DC

## 2024-04-19 ENCOUNTER — Telehealth: Payer: Self-pay

## 2024-04-19 DIAGNOSIS — R3915 Urgency of urination: Secondary | ICD-10-CM | POA: Insufficient documentation

## 2024-04-19 DIAGNOSIS — N3000 Acute cystitis without hematuria: Secondary | ICD-10-CM | POA: Insufficient documentation

## 2024-04-20 DIAGNOSIS — F322 Major depressive disorder, single episode, severe without psychotic features: Secondary | ICD-10-CM | POA: Insufficient documentation

## 2024-04-20 LAB — URINE CULTURE

## 2024-04-20 NOTE — Assessment & Plan Note (Signed)
The current medical regimen is fairly effective;  continue present plan and medications.  

## 2024-04-20 NOTE — Assessment & Plan Note (Signed)
 Start on doxycycline . Culture sent.

## 2024-04-20 NOTE — Assessment & Plan Note (Signed)
 Management per specialist.

## 2024-04-27 DIAGNOSIS — M25552 Pain in left hip: Secondary | ICD-10-CM | POA: Diagnosis not present

## 2024-04-30 DIAGNOSIS — H25813 Combined forms of age-related cataract, bilateral: Secondary | ICD-10-CM | POA: Diagnosis not present

## 2024-04-30 DIAGNOSIS — Z01818 Encounter for other preprocedural examination: Secondary | ICD-10-CM | POA: Diagnosis not present

## 2024-04-30 DIAGNOSIS — H2513 Age-related nuclear cataract, bilateral: Secondary | ICD-10-CM | POA: Diagnosis not present

## 2024-05-01 ENCOUNTER — Telehealth: Payer: Self-pay

## 2024-05-08 ENCOUNTER — Other Ambulatory Visit: Payer: Self-pay | Admitting: Family Medicine

## 2024-05-08 DIAGNOSIS — G8929 Other chronic pain: Secondary | ICD-10-CM

## 2024-05-08 MED ORDER — OXYCODONE HCL 10 MG PO TABS
10.0000 mg | ORAL_TABLET | Freq: Three times a day (TID) | ORAL | 0 refills | Status: DC | PRN
Start: 2024-05-08 — End: 2024-06-06

## 2024-05-08 MED ORDER — OXYCODONE HCL 10 MG PO TABS
10.0000 mg | ORAL_TABLET | Freq: Three times a day (TID) | ORAL | 0 refills | Status: DC | PRN
Start: 2024-05-08 — End: 2024-05-08

## 2024-05-08 NOTE — Telephone Encounter (Signed)
 Copied from CRM [id number]. Topic: Clinical - Medication Refill >> May 08, 2024  9:46 AM Sasha H wrote: Medication:  Oxycodone  HCl 10 MG TABS  Has the patient contacted their pharmacy? Yes (Agent: If no, request that the patient contact the pharmacy for the refill. If patient does not wish to contact the pharmacy document the reason why and proceed with request.) (Agent: If yes, when and what did the pharmacy advise?)  This is the patient's preferred pharmacy:  [id number] Georgeana Kindler, [place] - [address] AT [facility] [address] [place] [employer] [place] [address] Phone: [phone] Fax: [phone]  Is this the correct pharmacy for this prescription? Yes If no, delete pharmacy and type the correct one.   Has the prescription been filled recently? Yes  Is the patient out of the medication? Yes  Has the patient been seen for an appointment in the last year OR does the patient have an upcoming appointment? Yes  Can we respond through MyChart? Yes  Agent: Please be advised that Rx refills may take up to 3 business days. We ask that you follow-up with your pharmacy.

## 2024-05-10 ENCOUNTER — Telehealth: Payer: Self-pay

## 2024-05-10 NOTE — Progress Notes (Signed)
 Complex Care Management Care Guide Note  05/10/2024 Name: Victoria Pena MRN: [id number] DOB: 1951-11-22  Victoria Pena is a 73 y.o. year old female who is a primary care patient of Cox, Kirsten, MD and is actively engaged with the care management team. I reached out to Tyanne [name] Meriwether by phone today to assist with re-scheduling  with the RN Case Manager.  Follow up plan: Telephone appointment with complex care management team member scheduled for:  05/29/24 at 1:00 p.m.  [facility]  [facility], [facility] Health Care Management Assistant Direct Dial: [phone]  Fax: [phone]

## 2024-05-10 NOTE — Progress Notes (Signed)
 Complex Care Management Note Care Guide Note  05/10/2024 Name: Victoria Pena MRN: [id number] DOB: 1951/04/05   Complex Care Management Outreach Attempts: An unsuccessful telephone outreach was attempted today to offer the patient information about available complex care management services.  Follow Up Plan:  Additional outreach attempts will be made to offer the patient complex care management information and services.   Encounter Outcome:  No Answer  [facility]  [facility], [facility] Health Care Management Assistant Direct Dial: [phone]  Fax: [phone]

## 2024-05-14 ENCOUNTER — Other Ambulatory Visit: Payer: Self-pay | Admitting: Family Medicine

## 2024-05-21 ENCOUNTER — Other Ambulatory Visit: Payer: Self-pay | Admitting: Family Medicine

## 2024-05-21 DIAGNOSIS — F418 Other specified anxiety disorders: Secondary | ICD-10-CM

## 2024-05-27 ENCOUNTER — Other Ambulatory Visit: Payer: Self-pay | Admitting: Family Medicine

## 2024-05-27 DIAGNOSIS — K219 Gastro-esophageal reflux disease without esophagitis: Secondary | ICD-10-CM

## 2024-05-29 ENCOUNTER — Other Ambulatory Visit: Payer: Self-pay

## 2024-05-29 VITALS — BP 125/82 | HR 76 | Wt 176.0 lb

## 2024-05-29 DIAGNOSIS — F418 Other specified anxiety disorders: Secondary | ICD-10-CM

## 2024-05-29 NOTE — Patient Outreach (Signed)
 Complex Care Management   Visit Note  05/29/2024  Name:  Victoria Pena MRN: [id number] DOB: 10/20/1951  Situation: Referral received for Complex Care Management related to COPD, Mental/Behavioral Health diagnosis Depression with Anxiety, and HTN I obtained verbal consent from Patient.  Visit completed with patient  on the phone  Background:   Past Medical History:  Diagnosis Date   Acquired spondylolisthesis 05/12/2015   Adenocarcinoma of right lung, stage 1 (HCC) 07/26/2016   Anxiety    Arthritis    Arthropathy of lumbar facet joint 04/11/2019   Formatting of this note might be different from the original. Added automatically from request for surgery [id number]   Atherosclerosis of aorta (HCC) 07/08/2021   Benign essential hypertension 12/27/2016   BMI 30.0-30.9,adult 12/23/2020   COPD (chronic obstructive pulmonary disease) (HCC)    pt denies   Current mild episode of major depressive disorder (HCC) 03/24/2020   Daytime sleepiness 08/03/2021   Depression    Fibromyalgia 03/24/2020   Gastroesophageal reflux disease without esophagitis 03/24/2020   Graves disease 12/27/2016   Hypertension    Osteoporosis 08/03/2021   Pericardial cyst 02/02/2021   Postoperative hypothyroidism 01/17/2015   Reactive airways dysfunction syndrome, unspecified asthma severity, uncomplicated (HCC) 09/06/2015   S/P lobectomy of lung 07/26/2015   Formatting of this note might be different from the original. Right upper   S/P lumbar spinal fusion 05/11/2018   Thyroid  disease    Toe ulcer, left, limited to breakdown of skin (HCC) 12/23/2020    Assessment: Patient Reported Symptoms:  Cognitive Cognitive Status: Alert and oriented to person, place, and time      Neurological Neurological Review of Symptoms: Numbness, Weakness (due to compressed spinal nerve lumbar spine bilateral legs and feet)    HEENT HEENT Symptoms Reported: Sudden change or loss of vision (needs cataract surgery) HEENT  Conditions: Vision problem(s) Vision Problems: cataract(s) HEENT Management Strategies: Routine screening Vision problem(s)  Cardiovascular Cardiovascular Symptoms Reported: No symptoms reported Does patient have uncontrolled Hypertension?: Yes Is patient checking Blood Pressure at home?: Yes Patient's Recent BP reading at home: 125/82 Weight: 176 lb (79.8 kg)  Respiratory      Endocrine Patient reports the following symptoms related to hypoglycemia or hyperglycemia : No symptoms reported    Gastrointestinal Gastrointestinal Symptoms Reported: No symptoms reported Gastrointestinal Conditions: Reflux/heartburn Gastrointestinal Management Strategies: Diet modification, Medication therapy    Genitourinary Genitourinary Symptoms Reported: No symptoms reported    Integumentary Integumentary Symptoms Reported: No symptoms reported    Musculoskeletal Musculoskelatal Symptoms Reviewed: Difficulty walking, Muscle pain Additional Musculoskeletal Details: compressed lumbar spinal nerve, osteoporosis preventing surgical repair Musculoskeletal Conditions: Osteoporosis, Back pain Musculoskeletal Management Strategies: Adequate rest, Coping strategies, Medication therapy, Routine screening Musculoskeletal Self-Management Outcome: 3 (uncertain) Falls in the past year?: No Patient at Risk for Falls Due to: Impaired mobility Fall risk Follow up: Falls evaluation completed, Falls prevention discussed, Education provided  Psychosocial Psychosocial Symptoms Reported: No symptoms reported Additional Psychological Details: patient has started back to work, feeling MUCH better. Behavioral Management Strategies: Coping strategies, Counseling, Medication therapy, Support system, Exercise, Adequate rest Behavioral Health Self-Management Outcome: 4 (good) Behavioral Health Comment: patient requested new referral to LCSW for depression counseling (new referral to LCSW)   Quality of Family Relationships:  involved, helpful, supportive Do you feel physically threatened by others?: No      04/17/2024    1:40 PM  Depression screen PHQ 2/9  Decreased Interest 2  Down, Depressed, Hopeless 2  PHQ - 2 Score 4  Altered sleeping 3  Tired, decreased energy 2  Change in appetite 3  Feeling bad or failure about yourself  1  Trouble concentrating 3  Moving slowly or fidgety/restless 3  Suicidal thoughts 0  PHQ-9 Score 19  Difficult doing work/chores Not difficult at all    Vitals:   05/29/24 1320  BP: 125/82  Pulse: 76    Medications Reviewed Today     Reviewed by Clarnce Crow, RN (Registered Nurse) on 05/29/24 at 1325  Med List Status: <None>   Medication Order Taking? Sig Documenting Provider Last Dose Status Informant  ALPRAZolam  (XANAX ) 0.5 MG tablet [id number] Yes Take 1 tablet (0.5 mg total) by mouth 2 (two) times daily as needed for anxiety. Cox, Kirsten, MD Taking Active   atorvastatin  (LIPITOR) 40 MG tablet [id number] Yes TAKE 1 TABLET(40 MG) BY MOUTH DAILY Cox, Kirsten, MD Taking Active   buPROPion  (WELLBUTRIN  XL) 300 MG 24 hr tablet [id number] Yes TAKE 1 TABLET(300 MG) BY MOUTH DAILY Cox, Kirsten, MD Taking Active   Calcium  Carb-Cholecalciferol  (CALCIUM  500 + D3) 500-5 MG-MCG TABS [id number] Yes Take 3 tablets by mouth daily.  Patient taking differently: Take 1 tablet by mouth in the morning and at bedtime.   Audie Bleacher, MD Taking Active Self  calcium  carbonate (TUMS - DOSED IN MG ELEMENTAL CALCIUM ) 500 MG chewable tablet [id number] Yes Chew 1 tablet by mouth 2 (two) times daily as needed for indigestion or heartburn. [provider] Taking Active Self  celecoxib  (CELEBREX ) 200 MG capsule [id number] Yes TAKE 1 CAPSULE(200 MG) BY MOUTH DAILY Cox, Kirsten, MD Taking Active   doxycycline  (VIBRA -TABS) 100 MG tablet [id number] Yes Take 1 tablet (100 mg total) by mouth 2 (two) times daily. Cox, Kirsten, MD Taking Active   EPINEPHrine  0.3 mg/0.3 mL IJ SOAJ injection  [id number] Yes Inject 0.3 mg into the muscle as needed for anaphylaxis. Allegra Arch, NP Taking Active Self  escitalopram  (LEXAPRO ) 10 MG tablet [id number] Yes TAKE 1 TABLET(10 MG) BY MOUTH DAILY Cox, Kirsten, MD Taking Active   famotidine  (PEPCID ) 40 MG tablet [id number] Yes TAKE 1 TABLET(40 MG) BY MOUTH DAILY Mercy Stall, MD Taking Active   FORTEO  560 MCG/2.24ML SOPN [id number] No INJECT UNDER THE SKIN ONCE DAILY  Patient not taking: Reported on 05/29/2024   Mercy Stall, MD Not Taking Active   FORTEO  600 MCG/2.4ML SOPN [id number] Yes Inject 20 mcg into the skin daily. Cox, Kirsten, MD Taking Active   Insulin  Pen Needle 32G X 4 MM MISC [id number] Yes 1 each by Does not apply route daily. Cox, Kirsten, MD Taking Active   levothyroxine  (SYNTHROID ) 112 MCG tablet [id number] No Take 1 tablet (112 mcg total) by mouth daily before breakfast.  Patient not taking: Reported on 05/29/2024   Mercy Stall, MD Not Taking Active   losartan  (COZAAR ) 50 MG tablet [id number] Yes TAKE 1 TABLET(50 MG) BY MOUTH DAILY Cox, Kirsten, MD Taking Active   metroNIDAZOLE  (METROGEL ) 0.75 % gel [id number] No Apply 1 application topically 2 (two) times daily.  Patient not taking: Reported on 05/29/2024   Audie Bleacher, MD Not Taking Active Self           Med Note Burley Carpenter, Imara Standiford   Tue May 29, 2024  1:24 PM) Patient reports clear facial skin  Multiple Vitamin (MULTIVITAMIN WITH MINERALS) TABS tablet [id number] Yes Take 1 tablet by mouth in the morning and at bedtime. [provider] Taking Active Self  Oxycodone  HCl 10 MG TABS [id number] Yes Take 1  tablet (10 mg total) by mouth 3 (three) times daily as needed (severe back pain). Cox, Kirsten, MD Taking Active   pantoprazole  (PROTONIX ) 40 MG tablet [id number] Yes TAKE 1 TABLET EVERY DAY Cox, Kirsten, MD Taking Active   pregabalin  (LYRICA ) 150 MG capsule [id number] Yes TAKE 1 CAPSULE(150 MG) BY MOUTH TWICE DAILY Cox, Kirsten, MD Taking Active   trolamine  salicylate (ASPERCREME) 10 % cream [id number] Yes Apply 1 Application topically as needed for muscle pain. [provider] Taking Active Self  Vitamin D , Ergocalciferol , (DRISDOL ) 1.25 MG (50000 UNIT) CAPS capsule [id number] Yes TAKE 1 CAPSULE BY MOUTH 2 TIMES A WEEK Sirivol, Mamatha, MD Taking Active             Recommendation:   Referral to: LCSW  Follow Up Plan:   Telephone follow up appointment date/time:  06/28/24   Clarnce Crow BSN RN CCM [place]  [facility], [facility] RN Care Manager Direct Dial: [phone] Fax: [phone]

## 2024-05-29 NOTE — Patient Instructions (Signed)
 Visit Information  Thank you for taking time to visit with me today. Please don't hesitate to contact me if I can be of assistance to you before our next scheduled appointment.  Our next appointment is by telephone on 06/28/24 at 1:00 Please call the care guide team at [phone] if you need to cancel or reschedule your appointment.   Following is a copy of your care plan:   Goals Addressed             This Visit's Progress    VBCI RN Care Plan   On track    Problems:  Chronic Disease Management support and education needs related to COPD, Depression, and HTN  Goal: Over the next 3 months the Patient will attend all scheduled medical appointments: 07/18/24 with Dr. Reinhold Carbine as evidenced by chart review and patient report        continue to work with RN Care Manager and/or Social Worker to address care management and care coordination needs related to COPD, Depression, and HTN as evidenced by adherence to CM Team Scheduled appointments     demonstrate ongoing self health care management ability taking all medications as prescribed, attending all medical appintments and contacting providers office for questions or changes in health status as evidenced by     take all medications exactly as prescribed and will call provider for medication related questions as evidenced by patient report and chart review     Interventions:   COPD Interventions: Advised patient to track and manage COPD triggers Provided instruction about proper use of medications used for management of COPD including inhalers Advised patient to self assesses COPD action plan zone and make appointment with provider if in the yellow zone for 48 hours without improvement Advised patient to engage in light exercise as tolerated 3-5 days a week to aid in the the management of COPD Provided education about and advised patient to utilize infection prevention strategies to reduce risk of respiratory infection Discussed the importance  of adequate rest and management of fatigue with COPD   Hypertension Interventions: Last practice recorded BP readings:  BP Readings from Last 3 Encounters:  05/29/24 125/82  04/17/24 136/84  04/03/24 127/84   Most recent eGFR/CrCl:  Lab Results  Component Value Date   EGFR 92 04/17/2024    No components found for: "CRCL"  Evaluation of current treatment plan related to hypertension self management and patient's adherence to plan as established by provider Provided education to patient re: stroke prevention, s/s of heart attack and stroke Reviewed medications with patient and discussed importance of compliance Discussed plans with patient for ongoing care management follow up and provided patient with direct contact information for care management team Advised patient, providing education and rationale, to monitor blood pressure daily and record, calling PCP for findings outside established parameters Reviewed scheduled/upcoming provider appointments including:  Advised patient to discuss higher than average blood pressure readings with provider Placed referral to LCSW for depression counseling  Patient Self-Care Activities:  Attend all scheduled provider appointments Attend church or other social activities Call provider office for new concerns or questions  Perform all self care activities independently  Perform IADL's (shopping, preparing meals, housekeeping, managing finances) independently Take medications as prescribed   Work with the social worker to address care coordination needs and will continue to work with the clinical team to address health care and disease management related needs  Plan:  Telephone follow up appointment with care management team member scheduled for:  06/28/24  at 1:00 Message sent to [facility] Guide to reschedule patient with LCSW for counseling support for depression and anxiety             Please call the Suicide and Crisis Lifeline:  988 call the [place]  National Suicide Prevention Lifeline: 434-586-3269 or TTY: 435-844-4682 TTY 770-586-8734) to talk to a trained counselor call 1-800-273-TALK (toll free, 24 hour hotline) if you are experiencing a Mental Health or Behavioral Health Crisis or need someone to talk to.  Patient verbalizes understanding of instructions and care plan provided today and agrees to view in MyChart. Active MyChart status and patient understanding of how to access instructions and care plan via MyChart confirmed with patient.      Clarnce Crow BSN RN CCM [place]  [facility], [facility] RN Care Manager Direct Dial: [phone] Fax: [phone]

## 2024-06-06 ENCOUNTER — Other Ambulatory Visit: Payer: Self-pay | Admitting: Family Medicine

## 2024-06-06 DIAGNOSIS — G8929 Other chronic pain: Secondary | ICD-10-CM

## 2024-06-06 NOTE — Telephone Encounter (Unsigned)
 Copied from CRM [id number]. Topic: Clinical - Medication Refill >> Jun 06, 2024  1:12 PM Lin Rend J wrote: Medication: Oxycodone  HCl 10 MG TABS  Has the patient contacted their pharmacy? Yes (Agent: If no, request that the patient contact the pharmacy for the refill. If patient does not wish to contact the pharmacy document the reason why and proceed with request.) (Agent: If yes, when and what did the pharmacy advise?)  This is the patient's preferred pharmacy:  [id number] Georgeana Kindler, [place] - [address] AT [facility] [address] [place] [address] Phone: [phone] Fax: [phone]    Is this the correct pharmacy for this prescription? Yes If no, delete pharmacy and type the correct one.   Has the prescription been filled recently? Yes  Is the patient out of the medication? No  Has the patient been seen for an appointment in the last year OR does the patient have an upcoming appointment? Yes  Can we respond through MyChart? Yes  Agent: Please be advised that Rx refills may take up to 3 business days. We ask that you follow-up with your pharmacy.

## 2024-06-07 MED ORDER — OXYCODONE HCL 10 MG PO TABS: 10.0000 mg | ORAL_TABLET | Freq: Three times a day (TID) | ORAL | 0 refills | Status: DC | PRN

## 2024-06-14 ENCOUNTER — Telehealth: Payer: Self-pay

## 2024-06-14 NOTE — Progress Notes (Signed)
 Complex Care Management Note  Care Guide Note 06/14/2024 Name: Victoria Pena MRN: [id number] DOB: Mar 25, 1951  Victoria Pena is a 73 y.o. year old female who sees Cox, Kirsten, MD for primary care. I reached out to Marca [name] Saxe by phone today to offer complex care management services.  [name] was given information about Complex Care Management services today including:   The Complex Care Management services include support from the care team which includes your Nurse Care Manager, Clinical Social Worker, or Pharmacist.  The Complex Care Management team is here to help remove barriers to the health concerns and goals most important to you. Complex Care Management services are voluntary, and the patient may decline or stop services at any time by request to their care team member.   Complex Care Management Consent Status: Patient agreed to services and verbal consent obtained.   Follow up plan:  Telephone appointment with complex care management team member scheduled for:  06/21/24 at 3:00 p.m.   Encounter Outcome:  Patient Scheduled  Dreama [facility]  [facility], [facility] Care Management Assistant Direct Dial: [phone]  Fax: [phone]

## 2024-06-21 ENCOUNTER — Other Ambulatory Visit: Payer: Self-pay | Admitting: Licensed Clinical Social Worker

## 2024-06-24 NOTE — Patient Outreach (Signed)
 Complex Care Management   Visit Note  06/22/2024 late entry  Name:  Victoria Pena MRN: [id number] DOB: Sep 10, 1951  Situation: Referral received for Complex Care Management related to Mental/Behavioral Health diagnosis depression I obtained verbal consent from Patient.  Visit completed with K Dockendorf  on the phone. Pt reports improvement in mood since working a new job. Pt reports she is complaint with medication.  Background:   Past Medical History:  Diagnosis Date   Acquired spondylolisthesis 05/12/2015   Adenocarcinoma of right lung, stage 1 (HCC) 07/26/2016   Anxiety    Arthritis    Arthropathy of lumbar facet joint 04/11/2019   Formatting of this note might be different from the original. Added automatically from request for surgery [id number]   Atherosclerosis of aorta (HCC) 07/08/2021   Benign essential hypertension 12/27/2016   BMI 30.0-30.9,adult 12/23/2020   COPD (chronic obstructive pulmonary disease) (HCC)    pt denies   Current mild episode of major depressive disorder (HCC) 03/24/2020   Daytime sleepiness 08/03/2021   Depression    Fibromyalgia 03/24/2020   Gastroesophageal reflux disease without esophagitis 03/24/2020   Graves disease 12/27/2016   Hypertension    Osteoporosis 08/03/2021   Pericardial cyst 02/02/2021   Postoperative hypothyroidism 01/17/2015   Reactive airways dysfunction syndrome, unspecified asthma severity, uncomplicated (HCC) 09/06/2015   S/P lobectomy of lung 07/26/2015   Formatting of this note might be different from the original. Right upper   S/P lumbar spinal fusion 05/11/2018   Thyroid  disease    Toe ulcer, left, limited to breakdown of skin (HCC) 12/23/2020    Assessment: Patient Reported Symptoms:  Cognitive Cognitive Status: Alert and oriented to person, place, and time Cognitive/Intellectual Conditions Management [RPT]: None reported or documented in medical history or problem list      Neurological Neurological Review of  Symptoms: Weakness    HEENT HEENT Symptoms Reported: Sudden change or loss of vision HEENT Management Strategies: Routine screening    Cardiovascular Cardiovascular Symptoms Reported: No symptoms reported Cardiovascular Management Strategies: Medical device  Respiratory Respiratory Symptoms Reported: No symptoms reported    Endocrine Endocrine Symptoms Reported: No symptoms reported    Gastrointestinal Gastrointestinal Symptoms Reported: No symptoms reported      Genitourinary Genitourinary Symptoms Reported: No symptoms reported    Integumentary Integumentary Symptoms Reported: No symptoms reported    Musculoskeletal Musculoskelatal Symptoms Reviewed: Difficulty walking Musculoskeletal Management Strategies: Adequate rest, Medication therapy, Routine screening      Psychosocial Psychosocial Symptoms Reported: No symptoms reported Behavioral Management Strategies: Medication therapy Behavioral Health Comment: pt reports working a new job, therapy and medication is improving her mood   Quality of Family Relationships: supportive Do you feel physically threatened by others?: No      04/17/2024    1:40 PM  Depression screen PHQ 2/9  Decreased Interest 2  Down, Depressed, Hopeless 2  PHQ - 2 Score 4  Altered sleeping 3  Tired, decreased energy 2  Change in appetite 3  Feeling bad or failure about yourself  1  Trouble concentrating 3  Moving slowly or fidgety/restless 3  Suicidal thoughts 0  PHQ-9 Score 19  Difficult doing work/chores Not difficult at all    There were no vitals filed for this visit.  Medications Reviewed Today     Reviewed by Clement Odor, LCSW (Social Worker) on 06/24/24 at [phone]  Med List Status: <None>   Medication Order Taking? Sig Documenting Provider Last Dose Status Informant  ALPRAZolam  (XANAX ) 0.5 MG tablet [id number] No  Take 1 tablet (0.5 mg total) by mouth 2 (two) times daily as needed for anxiety. Cox, Kirsten, MD Taking Active    atorvastatin  (LIPITOR) 40 MG tablet [id number] No TAKE 1 TABLET(40 MG) BY MOUTH DAILY Cox, Kirsten, MD Taking Active   buPROPion  (WELLBUTRIN  XL) 300 MG 24 hr tablet [id number] No TAKE 1 TABLET(300 MG) BY MOUTH DAILY Cox, Kirsten, MD Taking Active   Calcium  Carb-Cholecalciferol  (CALCIUM  500 + D3) 500-5 MG-MCG TABS [id number] No Take 3 tablets by mouth daily.  Patient taking differently: Take 1 tablet by mouth in the morning and at bedtime.   Abran Jerilynn Loving, MD Taking Active Self  calcium  carbonate (TUMS - DOSED IN MG ELEMENTAL CALCIUM ) 500 MG chewable tablet [id number] No Chew 1 tablet by mouth 2 (two) times daily as needed for indigestion or heartburn. [provider] Taking Active Self  celecoxib  (CELEBREX ) 200 MG capsule [id number] No TAKE 1 CAPSULE(200 MG) BY MOUTH DAILY Cox, Kirsten, MD Taking Active   doxycycline  (VIBRA -TABS) 100 MG tablet [id number] No Take 1 tablet (100 mg total) by mouth 2 (two) times daily. Cox, Kirsten, MD Taking Active   EPINEPHrine  0.3 mg/0.3 mL IJ SOAJ injection [id number] No Inject 0.3 mg into the muscle as needed for anaphylaxis. Charlene Clotilda PARAS, NP Taking Active Self  escitalopram  (LEXAPRO ) 10 MG tablet [id number] No TAKE 1 TABLET(10 MG) BY MOUTH DAILY Cox, Kirsten, MD Taking Active   famotidine  (PEPCID ) 40 MG tablet [id number] No TAKE 1 TABLET(40 MG) BY MOUTH DAILY Cox, Kirsten, MD Taking Active   FORTEO  560 MCG/2.24ML SOPN [id number] No INJECT UNDER THE SKIN ONCE DAILY  Patient not taking: Reported on 05/29/2024   Sherre Clapper, MD Not Taking Active   FORTEO  600 MCG/2.4ML SOPN [id number] No Inject 20 mcg into the skin daily. Cox, Kirsten, MD Taking Active   Insulin  Pen Needle 32G X 4 MM MISC [id number] No 1 each by Does not apply route daily. Cox, Kirsten, MD Taking Active   levothyroxine  (SYNTHROID ) 112 MCG tablet [id number] No Take 1 tablet (112 mcg total) by mouth daily before breakfast.  Patient not taking: Reported on 05/29/2024   Sherre Clapper, MD  Not Taking Active   losartan  (COZAAR ) 50 MG tablet [id number] No TAKE 1 TABLET(50 MG) BY MOUTH DAILY Cox, Kirsten, MD Taking Active   metroNIDAZOLE  (METROGEL ) 0.75 % gel [id number] No Apply 1 application topically 2 (two) times daily.  Patient not taking: Reported on 05/29/2024   Abran Jerilynn Loving, MD Not Taking Active Self           Med Note DANICE, LISA   Tue May 29, 2024  1:24 PM) Patient reports clear facial skin  Multiple Vitamin (MULTIVITAMIN WITH MINERALS) TABS tablet [id number] No Take 1 tablet by mouth in the morning and at bedtime. [provider] Taking Active Self  Oxycodone  HCl 10 MG TABS [id number]  Take 1 tablet (10 mg total) by mouth 3 (three) times daily as needed (severe back pain). Cox, Kirsten, MD  Active   pantoprazole  (PROTONIX ) 40 MG tablet [id number] No TAKE 1 TABLET EVERY DAY Cox, Kirsten, MD Taking Active   pregabalin  (LYRICA ) 150 MG capsule [id number] No TAKE 1 CAPSULE(150 MG) BY MOUTH TWICE DAILY Cox, Kirsten, MD Taking Active   trolamine salicylate (ASPERCREME) 10 % cream [id number] No Apply 1 Application topically as needed for muscle pain. [provider] Taking Active Self  Vitamin D , Ergocalciferol , (DRISDOL ) 1.25 MG (50000 UNIT) CAPS capsule [id number] No TAKE 1 CAPSULE BY MOUTH 2 TIMES A  WEEK Sirivol, Mamatha, MD Taking Active             Recommendation:   Continue Current Plan of Care  Follow Up Plan:   Telephone follow up appointment date/time:  06/28/2024 with rncm  Alfonso Rummer MSW, LCSW Licensed Clinical Social Worker  [facility], Population Health Direct Dial: [phone]  Fax: [phone]

## 2024-06-24 NOTE — Patient Instructions (Signed)
 Visit Information  Thank you for taking time to visit with me today. Please don't hesitate to contact me if I can be of assistance to you before our next scheduled appointment.  Your next care management appointment is by telephone on 06/28/2024 with L Lonzell [facility]  Telephone follow up appointment date/time:  06/28/2024  Please call the care guide team at [phone] if you need to cancel, schedule, or reschedule an appointment.   Please call 911 if you are experiencing a Mental Health or Behavioral Health Crisis or need someone to talk to.  Alfonso Rummer MSW, LCSW Licensed Clinical Social Worker  [facility], Population Health Direct Dial: [phone]  Fax: [phone]

## 2024-06-25 ENCOUNTER — Other Ambulatory Visit: Payer: Self-pay | Admitting: Family Medicine

## 2024-06-25 DIAGNOSIS — C3491 Malignant neoplasm of unspecified part of right bronchus or lung: Secondary | ICD-10-CM

## 2024-06-25 DIAGNOSIS — E890 Postprocedural hypothyroidism: Secondary | ICD-10-CM

## 2024-06-28 ENCOUNTER — Other Ambulatory Visit: Payer: Self-pay

## 2024-06-28 NOTE — Patient Outreach (Signed)
 Complex Care Management   Visit Note  06/28/2024  Name:  Victoria Pena MRN: [id number] DOB: 1951/06/17  Situation: Referral received for Complex Care Management related to HTN  I obtained verbal consent from Patient.  Visit completed with patient  on the phone  Background:   Past Medical History:  Diagnosis Date   Acquired spondylolisthesis 05/12/2015   Adenocarcinoma of right lung, stage 1 (HCC) 07/26/2016   Anxiety    Arthritis    Arthropathy of lumbar facet joint 04/11/2019   Formatting of this note might be different from the original. Added automatically from request for surgery [phone]   Atherosclerosis of aorta (HCC) 07/08/2021   Benign essential hypertension 12/27/2016   BMI 30.0-30.9,adult 12/23/2020   COPD (chronic obstructive pulmonary disease) (HCC)    pt denies   Current mild episode of major depressive disorder (HCC) 03/24/2020   Daytime sleepiness 08/03/2021   Depression    Fibromyalgia 03/24/2020   Gastroesophageal reflux disease without esophagitis 03/24/2020   Graves disease 12/27/2016   Hypertension    Osteoporosis 08/03/2021   Pericardial cyst 02/02/2021   Postoperative hypothyroidism 01/17/2015   Reactive airways dysfunction syndrome, unspecified asthma severity, uncomplicated (HCC) 09/06/2015   S/P lobectomy of lung 07/26/2015   Formatting of this note might be different from the original. Right upper   S/P lumbar spinal fusion 05/11/2018   Thyroid  disease    Toe ulcer, left, limited to breakdown of skin (HCC) 12/23/2020    Assessment: Patient Reported Symptoms:  Cognitive Cognitive Status: Alert and oriented to person, place, and time      Neurological Neurological Review of Symptoms: Weakness, Numbness (hips down legs from chronic back pain.) Neurological Management Strategies: Adequate rest, Coping strategies, Medication therapy, Routine screening, Weight management  HEENT HEENT Symptoms Reported: Sudden change or loss of vision HEENT  Comment: patient pending cataract surgery Vision problem(s)  Cardiovascular Does patient have uncontrolled Hypertension?: Yes Is patient checking Blood Pressure at home?: Yes Patient's Recent BP reading at home: patient reports readings WNL    Respiratory Respiratory Symptoms Reported: No symptoms reported    Endocrine Endocrine Symptoms Reported: No symptoms reported Is patient diabetic?: No    Gastrointestinal Gastrointestinal Symptoms Reported: No symptoms reported      Genitourinary Genitourinary Symptoms Reported: No symptoms reported    Integumentary Integumentary Symptoms Reported: No symptoms reported    Musculoskeletal Additional Musculoskeletal Details: patient reports increased pain and numbness and weakness since starting new job on her feet a lot Musculoskeletal Management Strategies: Adequate rest, Activity, Weight management, Routine screening, Medication therapy, Exercise      Psychosocial Psychosocial Symptoms Reported: No symptoms reported            04/17/2024    1:40 PM  Depression screen PHQ 2/9  Decreased Interest 2  Down, Depressed, Hopeless 2  PHQ - 2 Score 4  Altered sleeping 3  Tired, decreased energy 2  Change in appetite 3  Feeling bad or failure about yourself  1  Trouble concentrating 3  Moving slowly or fidgety/restless 3  Suicidal thoughts 0  PHQ-9 Score 19  Difficult doing work/chores Not difficult at all    There were no vitals filed for this visit.  Medications Reviewed Today     Reviewed by Lonzell Planas, RN (Registered Nurse) on 06/28/24 at 1258  Med List Status: <None>   Medication Order Taking? Sig Documenting Provider Last Dose Status Informant  ALPRAZolam  (XANAX ) 0.5 MG tablet [id number] No Take 1 tablet (0.5 mg total) by mouth  2 (two) times daily as needed for anxiety. Cox, Kirsten, MD Taking Active   atorvastatin  (LIPITOR) 40 MG tablet [id number] No TAKE 1 TABLET(40 MG) BY MOUTH DAILY Cox, Kirsten, MD Taking Active    buPROPion  (WELLBUTRIN  XL) 300 MG 24 hr tablet [id number] No TAKE 1 TABLET(300 MG) BY MOUTH DAILY Cox, Kirsten, MD Taking Active   Calcium  Carb-Cholecalciferol  (CALCIUM  500 + D3) 500-5 MG-MCG TABS [id number] No Take 3 tablets by mouth daily.  Patient taking differently: Take 1 tablet by mouth in the morning and at bedtime.   Abran Jerilynn Loving, MD Taking Active Self  calcium  carbonate (TUMS - DOSED IN MG ELEMENTAL CALCIUM ) 500 MG chewable tablet [id number] No Chew 1 tablet by mouth 2 (two) times daily as needed for indigestion or heartburn. [provider] Taking Active Self  celecoxib  (CELEBREX ) 200 MG capsule [id number] No TAKE 1 CAPSULE(200 MG) BY MOUTH DAILY Cox, Kirsten, MD Taking Active   doxycycline  (VIBRA -TABS) 100 MG tablet [id number] No Take 1 tablet (100 mg total) by mouth 2 (two) times daily. Cox, Kirsten, MD Taking Active   EPINEPHrine  0.3 mg/0.3 mL IJ SOAJ injection [id number] No Inject 0.3 mg into the muscle as needed for anaphylaxis. Charlene Clotilda PARAS, NP Taking Active Self  escitalopram  (LEXAPRO ) 10 MG tablet [id number] No TAKE 1 TABLET(10 MG) BY MOUTH DAILY Cox, Kirsten, MD Taking Active   famotidine  (PEPCID ) 40 MG tablet [id number] No TAKE 1 TABLET(40 MG) BY MOUTH DAILY Sherre Clapper, MD Taking Active   FORTEO  560 MCG/2.24ML SOPN [id number] No INJECT UNDER THE SKIN ONCE DAILY  Patient not taking: Reported on 05/29/2024   Sherre Clapper, MD Not Taking Active   FORTEO  600 MCG/2.4ML SOPN [id number] No Inject 20 mcg into the skin daily. Cox, Kirsten, MD Taking Active   Insulin  Pen Needle 32G X 4 MM MISC [id number] No 1 each by Does not apply route daily. Cox, Kirsten, MD Taking Active   levothyroxine  (SYNTHROID ) 112 MCG tablet [id number]  TAKE 1 TABLET(112 MCG) BY MOUTH DAILY BEFORE OFILIA Sherre, Kirsten, MD  Active   losartan  (COZAAR ) 50 MG tablet [id number] No TAKE 1 TABLET(50 MG) BY MOUTH DAILY Cox, Kirsten, MD Taking Active   metroNIDAZOLE  (METROGEL ) 0.75 % gel [id number] No  Apply 1 application topically 2 (two) times daily.  Patient not taking: Reported on 05/29/2024   Abran Jerilynn Loving, MD Not Taking Active Self           Med Note DANICE, Maicee Ullman   Tue May 29, 2024  1:24 PM) Patient reports clear facial skin  Multiple Vitamin (MULTIVITAMIN WITH MINERALS) TABS tablet [id number] No Take 1 tablet by mouth in the morning and at bedtime. [provider] Taking Active Self  Oxycodone  HCl 10 MG TABS [id number]  Take 1 tablet (10 mg total) by mouth 3 (three) times daily as needed (severe back pain). Cox, Kirsten, MD  Active   pantoprazole  (PROTONIX ) 40 MG tablet [id number] No TAKE 1 TABLET EVERY DAY Cox, Kirsten, MD Taking Active   pregabalin  (LYRICA ) 150 MG capsule [id number] No TAKE 1 CAPSULE(150 MG) BY MOUTH TWICE DAILY Cox, Kirsten, MD Taking Active   trolamine salicylate (ASPERCREME) 10 % cream [id number] No Apply 1 Application topically as needed for muscle pain. [provider] Taking Active Self  Vitamin D , Ergocalciferol , (DRISDOL ) 1.25 MG (50000 UNIT) CAPS capsule [id number] No TAKE 1 CAPSULE BY MOUTH 2 TIMES A WEEK Sirivol, Mamatha, MD Taking Active             Recommendation:  Continue Current Plan of Care  Follow Up Plan:   Telephone follow up appointment date/time:  07/31/24 at 1:00  SIG  Olam Idol BSN RN CCM [place]  [facility], [facility] RN Care Manager Direct Dial: [phone] Fax: [phone]

## 2024-06-28 NOTE — Patient Instructions (Signed)
 Visit Information  Thank you for taking time to visit with me today. Please don't hesitate to contact me if I can be of assistance to you before our next scheduled appointment.  Our next appointment is by telephone on 07/30/24 at 1:00 Please call the care guide team at [phone] if you need to cancel or reschedule your appointment.   Following is a copy of your care plan:   Goals Addressed             This Visit's Progress    VBCI RN Care Plan   On track    Problems:  Chronic Disease Management support and education needs related to COPD, Depression, and HTN  Goal: Over the next 3 months the Patient will attend all scheduled medical appointments: 07/18/24 with Dr. Sherre as evidenced by chart review and patient report        continue to work with RN Care Manager and/or Social Worker to address care management and care coordination needs related to COPD, Depression, and HTN as evidenced by adherence to CM Team Scheduled appointments     demonstrate ongoing self health care management ability taking all medications as prescribed, attending all medical appintments and contacting providers office for questions or changes in health status as evidenced by     take all medications exactly as prescribed and will call provider for medication related questions as evidenced by patient report and chart review     Interventions:   COPD Interventions: Advised patient to track and manage COPD triggers Provided instruction about proper use of medications used for management of COPD including inhalers Advised patient to self assesses COPD action plan zone and make appointment with provider if in the yellow zone for 48 hours without improvement Advised patient to engage in light exercise as tolerated 3-5 days a week to aid in the the management of COPD Provided education about and advised patient to utilize infection prevention strategies to reduce risk of respiratory infection Discussed the importance  of adequate rest and management of fatigue with COPD   Hypertension Interventions: Last practice recorded BP readings:  BP Readings from Last 3 Encounters:  05/29/24 125/82  04/17/24 136/84  04/03/24 127/84   Most recent eGFR/CrCl:  Lab Results  Component Value Date   EGFR 92 04/17/2024    No components found for: CRCL  Evaluation of current treatment plan related to hypertension self management and patient's adherence to plan as established by provider Provided education to patient re: stroke prevention, s/s of heart attack and stroke Reviewed medications with patient and discussed importance of compliance Discussed plans with patient for ongoing care management follow up and provided patient with direct contact information for care management team Advised patient, providing education and rationale, to monitor blood pressure daily and record, calling PCP for findings outside established parameters Reviewed scheduled/upcoming provider appointments including:  Advised patient to discuss higher than average blood pressure readings with provider Placed referral to LCSW for depression counseling  Patient Self-Care Activities:  Attend all scheduled provider appointments Attend church or other social activities Call provider office for new concerns or questions  Perform all self care activities independently  Perform IADL's (shopping, preparing meals, housekeeping, managing finances) independently Take medications as prescribed   Work with the social worker to address care coordination needs and will continue to work with the clinical team to address health care and disease management related needs  Plan:  Telephone follow up appointment with care management team member scheduled for:  06/28/24  at 1:00 Message sent to [facility] Guide to reschedule patient with LCSW for counseling support for depression and anxiety             Please call the Suicide and Crisis Lifeline:  988 call the [place]  National Suicide Prevention Lifeline: 702 022 1374 or TTY: 843-766-6073 TTY (216)103-4288) to talk to a trained counselor call 1-800-273-TALK (toll free, 24 hour hotline) if you are experiencing a Mental Health or Behavioral Health Crisis or need someone to talk to.  Patient verbalizes understanding of instructions and care plan provided today and agrees to view in MyChart. Active MyChart status and patient understanding of how to access instructions and care plan via MyChart confirmed with patient.     SIGNATURE  Olam Idol BSN RN CCM [place]  [facility], [facility] Health RN Care Manager Direct Dial: [phone] Fax: [phone]

## 2024-07-03 ENCOUNTER — Other Ambulatory Visit: Payer: Self-pay | Admitting: Family Medicine

## 2024-07-03 DIAGNOSIS — G8929 Other chronic pain: Secondary | ICD-10-CM

## 2024-07-03 MED ORDER — OXYCODONE HCL 10 MG PO TABS: 10.0000 mg | ORAL_TABLET | Freq: Three times a day (TID) | ORAL | 0 refills | Status: DC | PRN

## 2024-07-03 NOTE — Telephone Encounter (Signed)
 Copied from CRM [id number]. Topic: Clinical - Medication Refill >> Jul 03, 2024  3:57 PM Emylou G wrote: Medication: Oxycodone  HCl 10 MG TABS  Has the patient contacted their pharmacy? No (Agent: If no, request that the patient contact the pharmacy for the refill. If patient does not wish to contact the pharmacy document the reason why and proceed with request.) (Agent: If yes, when and what did the pharmacy advise?)  This is the patient's preferred pharmacy:  [id number] [place], [place] - [address] AT [facility] [address] [place] [address] Phone: [phone] Fax: [phone]  Is this the correct pharmacy for this prescription? Yes If no, delete pharmacy and type the correct one.   Has the prescription been filled recently? No  Is the patient out of the medication? Yes  Has the patient been seen for an appointment in the last year OR does the patient have an upcoming appointment? Yes  Can we respond through MyChart? Yes  Agent: Please be advised that Rx refills may take up to 3 business days. We ask that you follow-up with your pharmacy.

## 2024-07-17 DIAGNOSIS — E0500 Thyrotoxicosis with diffuse goiter without thyrotoxic crisis or storm: Secondary | ICD-10-CM | POA: Diagnosis not present

## 2024-07-18 ENCOUNTER — Other Ambulatory Visit: Payer: Self-pay | Admitting: Family Medicine

## 2024-07-18 ENCOUNTER — Ambulatory Visit (INDEPENDENT_AMBULATORY_CARE_PROVIDER_SITE_OTHER): Admitting: Family Medicine

## 2024-07-18 ENCOUNTER — Encounter: Payer: Self-pay | Admitting: Family Medicine

## 2024-07-18 ENCOUNTER — Ambulatory Visit: Payer: Self-pay | Admitting: Family Medicine

## 2024-07-18 VITALS — BP 120/70 | HR 80 | Temp 98.0°F | Ht 65.0 in | Wt 176.0 lb

## 2024-07-18 DIAGNOSIS — R350 Frequency of micturition: Secondary | ICD-10-CM | POA: Diagnosis not present

## 2024-07-18 DIAGNOSIS — G8929 Other chronic pain: Secondary | ICD-10-CM

## 2024-07-18 DIAGNOSIS — E890 Postprocedural hypothyroidism: Secondary | ICD-10-CM

## 2024-07-18 DIAGNOSIS — I10 Essential (primary) hypertension: Secondary | ICD-10-CM

## 2024-07-18 DIAGNOSIS — M545 Low back pain, unspecified: Secondary | ICD-10-CM

## 2024-07-18 DIAGNOSIS — F1190 Opioid use, unspecified, uncomplicated: Secondary | ICD-10-CM | POA: Diagnosis not present

## 2024-07-18 DIAGNOSIS — F064 Anxiety disorder due to known physiological condition: Secondary | ICD-10-CM

## 2024-07-18 DIAGNOSIS — Z1231 Encounter for screening mammogram for malignant neoplasm of breast: Secondary | ICD-10-CM | POA: Diagnosis not present

## 2024-07-18 DIAGNOSIS — H532 Diplopia: Secondary | ICD-10-CM

## 2024-07-18 DIAGNOSIS — E782 Mixed hyperlipidemia: Secondary | ICD-10-CM

## 2024-07-18 LAB — POCT URINALYSIS DIP (CLINITEK)
Bilirubin, UA: NEGATIVE
Blood, UA: NEGATIVE
Glucose, UA: NEGATIVE mg/dL
Ketones, POC UA: NEGATIVE mg/dL
Nitrite, UA: NEGATIVE
POC PROTEIN,UA: NEGATIVE
Spec Grav, UA: 1.015 (ref 1.010–1.025)
Urobilinogen, UA: 0.2 U/dL
pH, UA: 6 (ref 5.0–8.0)

## 2024-07-18 MED ORDER — ALPRAZOLAM 0.5 MG PO TABS
0.5000 mg | ORAL_TABLET | Freq: Two times a day (BID) | ORAL | 2 refills | Status: DC | PRN
Start: 2024-07-18 — End: 2024-07-24

## 2024-07-18 MED ORDER — NALOXONE HCL 4 MG/0.1ML NA LIQD: NASAL | 1 refills | Status: AC

## 2024-07-18 MED ORDER — OXYCODONE HCL 10 MG PO TABS
10.0000 mg | ORAL_TABLET | Freq: Three times a day (TID) | ORAL | 0 refills | Status: DC | PRN
Start: 2024-07-18 — End: 2024-07-31

## 2024-07-18 NOTE — Assessment & Plan Note (Signed)
Well controlled.  No changes to medicines. Losartan 50 mg daily Continue to work on eating a healthy diet and exercise.  Labs drawn today.

## 2024-07-18 NOTE — Assessment & Plan Note (Signed)
 TSH and free T4 level will be checked today Continue synthroid  112 mcg daily

## 2024-07-18 NOTE — Assessment & Plan Note (Signed)
Well controlled.  No changes to medicines. Currently taking Atorvastatin 40 mg daily.  Continue to work on eating a healthy diet and exercise.

## 2024-07-18 NOTE — Progress Notes (Signed)
 Subjective:  Patient ID: Victoria Pena, female    DOB: 30-Sep-1951  Age: 73 y.o. MRN: [id number]  Chief Complaint  Patient presents with   Medical Management of Chronic Issues   Discussed the use of AI scribe software for clinical note transcription with the patient, who gave verbal consent to proceed.  History of Present Illness   Victoria Pena is a 73 year old female with Graves' disease who presents with double vision.  Diplopia and visual disturbances - Double vision, primarily affecting the right eye - Significant vision impairment due to cataracts, including halos around lights, difficulty watching TV, and challenges with reading - Vision worsened since last eye doctor visit - Removing glasses helps slightly with reading, but vision remains blurry - Uses reading glasses - Scheduled for cataract evaluation on September 10th  Thyroid  dysfunction (graves' disease) - History of Graves' disease - Thyroid  levels checked a couple of months ago - Thyroid  medication adjusted at that time  - Levothyroidism 112 mcg daily  Unintentional weight loss and anorexia - Weight decreased from 190 pounds in December to 176 pounds currently - Decreased appetite - Exercises daily at home  Chronic pain and neuropathy - Pain rated 8-9 out of 10 on average, decreases to 6 with medication but still significantly interferes with life and activities - Numbness from buttocks to feet, predominantly on the right side - Burning sensations in muscles - Swelling in legs, elevates legs when possible - Constipation from pain medication, managed with treatment - On Oxycodone  10 mg TID. On celebrex  200 mg once daily.   Osteoporosis and bone health concerns - Uses Forteo  for osteoporosis for about one year - Concerned about bone density and possibility of needing surgery - Not currently taking vitamin D  or calcium  supplements due to cost, plans to purchase calcium  citrate with vitamin  D  Fibromyalgia - Takes Lyrica  150 mg twice daily for fibromyalgia - Chronic pain and burning muscle sensations  Depression and anxiety - History of depression and anxiety - Takes medication for mood disorders  - Wellbutrin  300 mg daily, lexapro  10 mg daily, xanax  0.5 mg BID PRN.  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    Vitamin D  defiency:  - ran out of vitamin D  due to cost.  Prescription was too expensive.    Hypertension:  - Losartan  50 mg daily.     GERD:  - pantoprazole  40 mg daily and famotidine  40 mg daily.      07/18/2024    2:37 PM 04/17/2024    1:40 PM 11/24/2023    1:50 PM 10/06/2023    1:21 PM 08/17/2023    8:37 AM  Depression screen PHQ 2/9  Decreased Interest 0 2 2 2 2   Down, Depressed, Hopeless 0 2 1 2 2   PHQ - 2 Score 0 4 3 4 4   Altered sleeping 3 3 3 2 2   Tired, decreased energy 1 2 2 2 2   Change in appetite 3 3 3 2 2   Feeling bad or failure about yourself  0 1 1 0 0  Trouble concentrating 2 3 2 2 2   Moving slowly or fidgety/restless 0 3 0 0 0  Suicidal thoughts 0 0 0 0 0  PHQ-9 Score 9 19 14 12 12   Difficult doing work/chores Not difficult at all Not difficult at all Somewhat difficult Somewhat difficult Somewhat  difficult        05/29/2024    1:33 PM  Fall Risk   Falls in the past year? 0  Risk for fall due to : Impaired mobility  Follow up Falls evaluation completed;Falls prevention discussed;Education provided    Patient Care Team: Sherre Clapper, MD as PCP - General (Family Medicine) Clement Odor, LCSW as Social Worker (Licensed Visual merchandiser)   Review of Systems  Constitutional:  Negative for chills, fatigue and fever.  HENT:  Negative for congestion, ear pain, rhinorrhea and sore throat.   Eyes:  Positive for visual disturbance.  Respiratory:  Negative for cough and shortness of breath.   Cardiovascular:  Negative  for chest pain.  Gastrointestinal:  Negative for abdominal pain, constipation, diarrhea, nausea and vomiting.  Genitourinary:  Negative for dysuria and urgency.  Musculoskeletal:  Positive for back pain and myalgias.  Neurological:  Negative for dizziness, weakness, light-headedness and headaches.  Psychiatric/Behavioral:  Negative for dysphoric mood. The patient is not nervous/anxious.     Current Outpatient Medications on File Prior to Visit  Medication Sig Dispense Refill   atorvastatin  (LIPITOR) 40 MG tablet TAKE 1 TABLET(40 MG) BY MOUTH DAILY 90 tablet 0   buPROPion  (WELLBUTRIN  XL) 300 MG 24 hr tablet TAKE 1 TABLET(300 MG) BY MOUTH DAILY 90 tablet 0   Calcium  Carb-Cholecalciferol  (CALCIUM  500 + D3) 500-5 MG-MCG TABS Take 3 tablets by mouth daily. (Patient taking differently: Take 1 tablet by mouth in the morning and at bedtime.) 100 tablet 5   calcium  carbonate (TUMS - DOSED IN MG ELEMENTAL CALCIUM ) 500 MG chewable tablet Chew 1 tablet by mouth 2 (two) times daily as needed for indigestion or heartburn.     celecoxib  (CELEBREX ) 200 MG capsule TAKE 1 CAPSULE(200 MG) BY MOUTH DAILY 90 capsule 0   EPINEPHrine  0.3 mg/0.3 mL IJ SOAJ injection Inject 0.3 mg into the muscle as needed for anaphylaxis. 1 each 1   escitalopram  (LEXAPRO ) 10 MG tablet TAKE 1 TABLET(10 MG) BY MOUTH DAILY 90 tablet 1   famotidine  (PEPCID ) 40 MG tablet TAKE 1 TABLET(40 MG) BY MOUTH DAILY 90 tablet 1   FORTEO  600 MCG/2.4ML SOPN Inject 20 mcg into the skin daily. 216 mL 1   Insulin  Pen Needle 32G X 4 MM MISC 1 each by Does not apply route daily. 100 each 3   levothyroxine  (SYNTHROID ) 112 MCG tablet TAKE 1 TABLET(112 MCG) BY MOUTH DAILY BEFORE BREAKFAST 90 tablet 0   losartan  (COZAAR ) 50 MG tablet TAKE 1 TABLET(50 MG) BY MOUTH DAILY 90 tablet 1   Multiple Vitamin (MULTIVITAMIN WITH MINERALS) TABS tablet Take 1 tablet by mouth in the morning and at bedtime.     pantoprazole  (PROTONIX ) 40 MG tablet TAKE 1 TABLET EVERY DAY 90  tablet 3   pregabalin  (LYRICA ) 150 MG capsule TAKE 1 CAPSULE(150 MG) BY MOUTH TWICE DAILY 180 capsule 1   trolamine salicylate (ASPERCREME) 10 % cream Apply 1 Application topically as needed for muscle pain.     Vitamin D , Ergocalciferol , (DRISDOL ) 1.25 MG (50000 UNIT) CAPS capsule TAKE 1 CAPSULE BY MOUTH 2 TIMES A WEEK 24 capsule 0   No current facility-administered medications on file prior to visit.   Past Medical History:  Diagnosis Date   Acquired spondylolisthesis 05/12/2015   Adenocarcinoma of right lung, stage 1 (HCC) 07/26/2016   Anxiety    Arthritis    Arthropathy of lumbar facet joint 04/11/2019   Formatting of this note might be different from the original. Added  automatically from request for surgery [id number]   Atherosclerosis of aorta (HCC) 07/08/2021   Benign essential hypertension 12/27/2016   BMI 30.0-30.9,adult 12/23/2020   COPD (chronic obstructive pulmonary disease) (HCC)    pt denies   Current mild episode of major depressive disorder (HCC) 03/24/2020   Daytime sleepiness 08/03/2021   Depression    Fibromyalgia 03/24/2020   Gastroesophageal reflux disease without esophagitis 03/24/2020   Graves disease 12/27/2016   Hypertension    Osteoporosis 08/03/2021   Pericardial cyst 02/02/2021   Postoperative hypothyroidism 01/17/2015   Reactive airways dysfunction syndrome, unspecified asthma severity, uncomplicated (HCC) 09/06/2015   S/P lobectomy of lung 07/26/2015   Formatting of this note might be different from the original. Right upper   S/P lumbar spinal fusion 05/11/2018   Thyroid  disease    Toe ulcer, left, limited to breakdown of skin (HCC) 12/23/2020   Past Surgical History:  Procedure Laterality Date   ANKLE SURGERY     bilateral   APPENDECTOMY     BREAST BIOPSY Left 02/25/2020   BREAST BIOPSY Right 08/17/2018   BREAST EXCISIONAL BIOPSY Right    unsure when but marked with scar marker   CHOLECYSTECTOMY     EYE MUSCLE SURGERY Bilateral    4-5 years  ago   LAMINECTOMY WITH POSTERIOR LATERAL ARTHRODESIS LEVEL 2 N/A 01/21/2023   Procedure: Laminectomy  - L1-L2 - L2-L3 with non-instrumented posterior lateral fusion;  Surgeon: Joshua Alm RAMAN, MD;  Location: [facility] OR;  Service: Neurosurgery;  Laterality: N/A;   LUMBAR FUSION  2019   L4, 5, 6   REPLACEMENT TOTAL KNEE Left    THYROIDECTOMY      Family History  Problem Relation Age of Onset   Cancer Mother    Cancer Father    Cancer Brother    Autism Grandson    Breast cancer Neg Hx    Social History   Socioeconomic History   Marital status: Widowed    Spouse name: Not on file   Number of children: Not on file   Years of education: Not on file   Highest education level: GED or equivalent  Occupational History   Not on file  Tobacco Use   Smoking status: Former    Current packs/day: 0.00    Average packs/day: 1.5 packs/day for 35.0 years (52.5 ttl pk-yrs)    Types: Cigarettes    Start date: 07/26/1964    Quit date: 07/27/1999    Years since quitting: 25.0   Smokeless tobacco: Never  Vaping Use   Vaping status: Never Used  Substance and Sexual Activity   Alcohol use: No   Drug use: No   Sexual activity: Not Currently  Other Topics Concern   Not on file  Social History Narrative   Not on file   Social Drivers of Health   Financial Resource Strain: High Risk (11/20/2023)   Overall Financial Resource Strain (CARDIA)    Difficulty of Paying Living Expenses: Hard  Food Insecurity: No Food Insecurity (06/28/2024)   Hunger Vital Sign    Worried About Running Out of Food in the Last Year: Never true    Ran Out of Food in the Last Year: Never true  Recent Concern: Food Insecurity - Food Insecurity Present (06/24/2024)   Hunger Vital Sign    Worried About Running Out of Food in the Last Year: Sometimes true    Ran Out of Food in the Last Year: Never true  Transportation Needs: No Transportation Needs (06/28/2024)   PRAPARE -  Administrator, Civil Service (Medical): No     Lack of Transportation (Non-Medical): No  Physical Activity: Sufficiently Active (11/20/2023)   Exercise Vital Sign    Days of Exercise per Week: 4 days    Minutes of Exercise per Session: 40 min  Recent Concern: Physical Activity - Insufficiently Active (10/05/2023)   Exercise Vital Sign    Days of Exercise per Week: 2 days    Minutes of Exercise per Session: 30 min  Stress: Stress Concern Present (11/20/2023)   [employer] of Occupational Health - Occupational Stress Questionnaire    Feeling of Stress : To some extent  Social Connections: Moderately Isolated (11/20/2023)   Social Connection and Isolation Panel    Frequency of Communication with Friends and Family: More than three times a week    Frequency of Social Gatherings with Friends and Family: Once a week    Attends Religious Services: Never    Database administrator or Organizations: Yes    Attends Banker Meetings: Never    Marital Status: Widowed    Objective:  BP 120/70   Pulse 80   Temp 98 F (36.7 C)   Ht 5' 5 (1.651 m)   Wt 176 lb (79.8 kg)   LMP  (LMP Unknown)   SpO2 97%   BMI 29.29 kg/m      07/18/2024    2:36 PM 05/29/2024    1:20 PM 04/17/2024    1:38 PM  BP/Weight  Systolic BP 120 125 136  Diastolic BP 70 82 84  Wt. (Lbs) 176 176 182  BMI 29.29 kg/m2 29.29 kg/m2 30.29 kg/m2    Physical Exam Vitals reviewed.  Constitutional:      Appearance: Normal appearance. She is normal weight.  Neck:     Vascular: No carotid bruit.  Cardiovascular:     Rate and Rhythm: Normal rate and regular rhythm.     Heart sounds: Normal heart sounds.  Pulmonary:     Effort: Pulmonary effort is normal. No respiratory distress.     Breath sounds: Normal breath sounds.  Abdominal:     General: Abdomen is flat. Bowel sounds are normal.     Palpations: Abdomen is soft.     Tenderness: There is no abdominal tenderness.  Neurological:     Mental Status: She is alert and oriented to person, place, and  time.  Psychiatric:        Mood and Affect: Mood normal.        Behavior: Behavior normal.         Lab Results  Component Value Date   WBC 6.0 07/18/2024   HGB 13.3 07/18/2024   HCT 42.5 07/18/2024   PLT 242 07/18/2024   GLUCOSE 96 07/18/2024   CHOL 143 07/18/2024   TRIG 72 07/18/2024   HDL 83 07/18/2024   LDLCALC 46 07/18/2024   ALT 17 07/18/2024   AST 17 07/18/2024   NA 142 07/18/2024   K 5.1 07/18/2024   CL 103 07/18/2024   CREATININE 0.84 07/18/2024   BUN 20 07/18/2024   CO2 25 07/18/2024   TSH 0.306 (L) 07/18/2024   INR 1.0 12/31/2022      Assessment & Plan:   Benign essential hypertension Assessment & Plan: Well controlled.  No changes to medicines. Losartan  50 mg daily.  Continue to work on eating a healthy diet and exercise.  Labs drawn today.   Orders: -     Comprehensive metabolic panel with GFR -  CBC with Differential/Platelet  Postoperative hypothyroidism Assessment & Plan: TSH and free T4 level will be checked today Continue synthroid  112 mcg daily  Orders: -     TSH -     C-reactive protein -     Sedimentation rate  Urinary frequency -     POCT URINALYSIS DIP (CLINITEK) -     Urine Culture  Mixed hyperlipidemia Assessment & Plan: Well controlled.  No changes to medicines. Currently taking Atorvastatin  40 mg daily.  Continue to work on eating a healthy diet and exercise.     Orders: -     Lipid panel  Anxiety disorder due to known physiological condition Assessment & Plan: The current medical regimen is effective;  continue present plan and medications. Continue xanax .   Orders: -     ALPRAZolam ; Take 1 tablet (0.5 mg total) by mouth 2 (two) times daily as needed for anxiety.  Dispense: 60 tablet; Refill: 2  Chronic midline low back pain without sciatica Assessment & Plan: Severe OA of spine.  Chronic pain syndrome with fibromyalgia and osteoporosis. Reports numbness and burning sensation, pain level 8-9/10 reduced  to 6/10 with medication. Pain affects daily activities. - Sign pain management agreement. - Discuss Narcan  availability for her daughter for emergency use.   Orders: -     oxyCODONE  HCl; Take 1 tablet (10 mg total) by mouth 3 (three) times daily as needed (severe back pain).  Dispense: 90 tablet; Refill: 0 -     Pain Mgt Scrn (14 Drugs), Ur  Diplopia Assessment & Plan: Check ct scan of brain. Rule out stroke.   Orders: -     CT HEAD WO CONTRAST ( ); Future  Opioid use Assessment & Plan: Check UDS.  Orders: -     Pain Mgt Scrn (14 Drugs), Ur  Encounter for screening mammogram for malignant neoplasm of breast Assessment & Plan: Check mammogram.  Orders: -     3D Screening Mammogram, Left and Right; Future     Meds ordered this encounter  Medications   ALPRAZolam  (XANAX ) 0.5 MG tablet    Sig: Take 1 tablet (0.5 mg total) by mouth 2 (two) times daily as needed for anxiety.    Dispense:  60 tablet    Refill:  2   Oxycodone  HCl 10 MG TABS    Sig: Take 1 tablet (10 mg total) by mouth 3 (three) times daily as needed (severe back pain).    Dispense:  90 tablet    Refill:  0    Orders Placed This Encounter  Procedures   Urine Culture   CT HEAD WO CONTRAST ( )   MM 3D SCREENING MAMMOGRAM BILATERAL BREAST   TSH   Lipid panel   Comprehensive metabolic panel with GFR   CBC with Differential/Platelet   C-reactive protein   Sedimentation rate   Pain Mgt Scrn (14 Drugs), Ur   POCT URINALYSIS DIP (CLINITEK)     Follow-up: No follow-ups on file.   I,Katherina A Bramblett,acting as a scribe for Abigail Free, MD.,have documented all relevant documentation on the behalf of Abigail Free, MD,as directed by  Abigail Free, MD while in the presence of Abigail Free, MD.   An After Visit Summary was printed and given to the patient.  Total time spent on today's visit was 40 minutes, including both face-to-face time and nonface-to-face time personally spent on review of chart  (labs and imaging), discussing labs and goals, discussing further work-up, treatment options, referrals to specialist if needed, reviewing  outside records of pertinent, answering patient's questions, and coordinating care.  I attest that I have reviewed this visit and agree with the plan scribed by my staff.   Abigail Free, MD [facility] [phone]

## 2024-07-19 ENCOUNTER — Other Ambulatory Visit (HOSPITAL_BASED_OUTPATIENT_CLINIC_OR_DEPARTMENT_OTHER): Admitting: Radiology

## 2024-07-19 DIAGNOSIS — F064 Anxiety disorder due to known physiological condition: Secondary | ICD-10-CM | POA: Insufficient documentation

## 2024-07-19 LAB — CBC WITH DIFFERENTIAL/PLATELET
Basophils Absolute: 0.1 x10E3/uL (ref 0.0–0.2)
Basos: 1 %
EOS (ABSOLUTE): 0.1 x10E3/uL (ref 0.0–0.4)
Eos: 1 %
Hematocrit: 42.5 % (ref 34.0–46.6)
Hemoglobin: 13.3 g/dL (ref 11.1–15.9)
Immature Grans (Abs): 0 x10E3/uL (ref 0.0–0.1)
Immature Granulocytes: 0 %
Lymphocytes Absolute: 1.5 x10E3/uL (ref 0.7–3.1)
Lymphs: 25 %
MCH: 27.9 pg (ref 26.6–33.0)
MCHC: 31.3 g/dL — ABNORMAL LOW (ref 31.5–35.7)
MCV: 89 fL (ref 79–97)
Monocytes Absolute: 0.5 x10E3/uL (ref 0.1–0.9)
Monocytes: 9 %
Neutrophils Absolute: 3.9 x10E3/uL (ref 1.4–7.0)
Neutrophils: 64 %
Platelets: 242 x10E3/uL (ref 150–450)
RBC: 4.77 x10E6/uL (ref 3.77–5.28)
RDW: 13.9 % (ref 11.7–15.4)
WBC: 6 x10E3/uL (ref 3.4–10.8)

## 2024-07-19 LAB — PAIN MGT SCRN (14 DRUGS), UR
Amphetamine Scrn, Ur: NEGATIVE ng/mL
BARBITURATE SCREEN URINE: NEGATIVE ng/mL
BENZODIAZEPINE SCREEN, URINE: NEGATIVE ng/mL
Buprenorphine, Urine: NEGATIVE ng/mL
CANNABINOIDS UR QL SCN: NEGATIVE ng/mL
Cocaine (Metab) Scrn, Ur: NEGATIVE ng/mL
Creatinine(Crt), U: 23.2 mg/dL (ref 20.0–300.0)
Fentanyl, Urine: NEGATIVE pg/mL
Meperidine Screen, Urine: NEGATIVE ng/mL
Methadone Screen, Urine: NEGATIVE ng/mL
OXYCODONE+OXYMORPHONE UR QL SCN: POSITIVE ng/mL — AB
Opiate Scrn, Ur: NEGATIVE ng/mL
Ph of Urine: 5.8 (ref 4.5–8.9)
Phencyclidine Qn, Ur: NEGATIVE ng/mL
Propoxyphene Scrn, Ur: NEGATIVE ng/mL
Tramadol Screen, Urine: NEGATIVE ng/mL

## 2024-07-19 LAB — LIPID PANEL
Chol/HDL Ratio: 1.7 ratio (ref 0.0–4.4)
Cholesterol, Total: 143 mg/dL (ref 100–199)
HDL: 83 mg/dL (ref >39)
LDL Chol Calc (NIH): 46 mg/dL (ref 0–99)
Triglycerides: 72 mg/dL (ref 0–149)
VLDL Cholesterol Cal: 14 mg/dL (ref 5–40)

## 2024-07-19 LAB — COMPREHENSIVE METABOLIC PANEL WITH GFR
ALT: 17 IU/L (ref 0–32)
AST: 17 IU/L (ref 0–40)
Albumin: 4.1 g/dL (ref 3.8–4.8)
Alkaline Phosphatase: 191 IU/L — ABNORMAL HIGH (ref 44–121)
BUN/Creatinine Ratio: 24 (ref 12–28)
BUN: 20 mg/dL (ref 8–27)
Bilirubin Total: 0.5 mg/dL (ref 0.0–1.2)
CO2: 25 mmol/L (ref 20–29)
Calcium: 9.9 mg/dL (ref 8.7–10.3)
Chloride: 103 mmol/L (ref 96–106)
Creatinine, Ser: 0.84 mg/dL (ref 0.57–1.00)
Globulin, Total: 2.3 g/dL (ref 1.5–4.5)
Glucose: 96 mg/dL (ref 70–99)
Potassium: 5.1 mmol/L (ref 3.5–5.2)
Sodium: 142 mmol/L (ref 134–144)
Total Protein: 6.4 g/dL (ref 6.0–8.5)
eGFR: 73 mL/min/1.73 (ref >59)

## 2024-07-19 LAB — C-REACTIVE PROTEIN: CRP: <1 mg/L (ref 0–10)

## 2024-07-19 LAB — TSH: TSH: 0.306 u[IU]/mL — ABNORMAL LOW (ref 0.450–4.500)

## 2024-07-19 LAB — SEDIMENTATION RATE: Sed Rate: 9 mm/h (ref 0–40)

## 2024-07-20 LAB — URINE CULTURE

## 2024-07-22 DIAGNOSIS — F1190 Opioid use, unspecified, uncomplicated: Secondary | ICD-10-CM | POA: Insufficient documentation

## 2024-07-22 DIAGNOSIS — R350 Frequency of micturition: Secondary | ICD-10-CM | POA: Insufficient documentation

## 2024-07-22 DIAGNOSIS — Z1231 Encounter for screening mammogram for malignant neoplasm of breast: Secondary | ICD-10-CM | POA: Insufficient documentation

## 2024-07-22 DIAGNOSIS — H532 Diplopia: Secondary | ICD-10-CM | POA: Insufficient documentation

## 2024-07-22 NOTE — Assessment & Plan Note (Signed)
 Severe OA of spine.  Chronic pain syndrome with fibromyalgia and osteoporosis. Reports numbness and burning sensation, pain level 8-9/10 reduced to 6/10 with medication. Pain affects daily activities. - Sign pain management agreement. - Discuss Narcan  availability for her daughter for emergency use.

## 2024-07-22 NOTE — Assessment & Plan Note (Signed)
-   Check UDS

## 2024-07-22 NOTE — Assessment & Plan Note (Signed)
Check mammogram 

## 2024-07-22 NOTE — Assessment & Plan Note (Signed)
 Check ct scan of brain. Rule out stroke.

## 2024-07-22 NOTE — Assessment & Plan Note (Signed)
 The current medical regimen is effective;  continue present plan and medications. Continue xanax.

## 2024-07-24 ENCOUNTER — Other Ambulatory Visit (HOSPITAL_BASED_OUTPATIENT_CLINIC_OR_DEPARTMENT_OTHER): Admitting: Radiology

## 2024-07-28 ENCOUNTER — Other Ambulatory Visit: Payer: Self-pay | Admitting: Family Medicine

## 2024-07-30 ENCOUNTER — Other Ambulatory Visit: Payer: Self-pay | Admitting: Family Medicine

## 2024-07-30 DIAGNOSIS — Z6829 Body mass index (BMI) 29.0-29.9, adult: Secondary | ICD-10-CM | POA: Diagnosis not present

## 2024-07-30 DIAGNOSIS — M4317 Spondylolisthesis, lumbosacral region: Secondary | ICD-10-CM | POA: Diagnosis not present

## 2024-07-30 DIAGNOSIS — M5416 Radiculopathy, lumbar region: Secondary | ICD-10-CM | POA: Diagnosis not present

## 2024-07-30 DIAGNOSIS — M545 Low back pain, unspecified: Secondary | ICD-10-CM

## 2024-07-30 DIAGNOSIS — G8929 Other chronic pain: Secondary | ICD-10-CM

## 2024-07-30 NOTE — Telephone Encounter (Signed)
 Copied from CRM [id number]. Topic: Clinical - Medication Refill >> Jul 30, 2024 10:15 AM Fonda T wrote: Medication: Oxycodone  HCl 10 MG TABS  Has the patient contacted their pharmacy? Yes, advised to contact pharmacy  This is the patient's preferred pharmacy:   [id number] [place], [place] - [address] AT [facility] [address] [place] [address] Phone: [phone] Fax: [phone]  Is this the correct pharmacy for this prescription? Yes If no, delete pharmacy and type the correct one.   Has the prescription been filled recently? Yes  Is the patient out of the medication? No  Has the patient been seen for an appointment in the last year OR does the patient have an upcoming appointment? Yes  Can we respond through MyChart? No, patient prefers a phone call if need to be reached  Agent: Please be advised that Rx refills may take up to 3 business days. We ask that you follow-up with your pharmacy.

## 2024-07-31 ENCOUNTER — Other Ambulatory Visit: Payer: Self-pay | Admitting: Family Medicine

## 2024-07-31 ENCOUNTER — Other Ambulatory Visit: Payer: Self-pay

## 2024-07-31 ENCOUNTER — Ambulatory Visit (HOSPITAL_BASED_OUTPATIENT_CLINIC_OR_DEPARTMENT_OTHER)
Admission: RE | Admit: 2024-07-31 | Discharge: 2024-07-31 | Disposition: A | Discharge status: Home / Self Care | Source: Ambulatory Visit | Attending: Family Medicine | Admitting: Family Medicine

## 2024-07-31 ENCOUNTER — Telehealth: Payer: Self-pay

## 2024-07-31 ENCOUNTER — Other Ambulatory Visit: Payer: Self-pay | Admitting: Physician Assistant

## 2024-07-31 ENCOUNTER — Telehealth

## 2024-07-31 ENCOUNTER — Other Ambulatory Visit: Payer: Self-pay | Admitting: Neurological Surgery

## 2024-07-31 DIAGNOSIS — H532 Diplopia: Secondary | ICD-10-CM

## 2024-07-31 DIAGNOSIS — G8929 Other chronic pain: Secondary | ICD-10-CM

## 2024-07-31 DIAGNOSIS — M545 Low back pain, unspecified: Secondary | ICD-10-CM

## 2024-07-31 DIAGNOSIS — M4317 Spondylolisthesis, lumbosacral region: Secondary | ICD-10-CM

## 2024-07-31 DIAGNOSIS — R29818 Other symptoms and signs involving the nervous system: Secondary | ICD-10-CM | POA: Diagnosis not present

## 2024-07-31 DIAGNOSIS — I6523 Occlusion and stenosis of bilateral carotid arteries: Secondary | ICD-10-CM | POA: Diagnosis not present

## 2024-07-31 MED ORDER — OXYCODONE HCL 10 MG PO TABS: 10.0000 mg | ORAL_TABLET | Freq: Three times a day (TID) | ORAL | 0 refills | Status: DC | PRN

## 2024-07-31 MED ORDER — ALPRAZOLAM 0.5 MG PO TABS: 0.5000 mg | ORAL_TABLET | Freq: Two times a day (BID) | ORAL | 0 refills | Status: DC | PRN

## 2024-07-31 NOTE — Telephone Encounter (Signed)
 Copied from CRM [id number]. Topic: Clinical - Medication Question >> Jul 31, 2024  1:53 PM Delon DASEN wrote: Reason for CRM: Oxycodone  HCl 10 MG TABS- requesting update on refil

## 2024-07-31 NOTE — Telephone Encounter (Signed)
 Called patient and let her know. She will call pharmacy.

## 2024-08-01 ENCOUNTER — Other Ambulatory Visit: Payer: Self-pay | Admitting: Family Medicine

## 2024-08-01 ENCOUNTER — Other Ambulatory Visit: Payer: Self-pay | Admitting: *Deleted

## 2024-08-01 ENCOUNTER — Other Ambulatory Visit: Payer: Self-pay

## 2024-08-01 ENCOUNTER — Encounter: Payer: Self-pay | Admitting: *Deleted

## 2024-08-01 NOTE — Patient Instructions (Signed)
 Visit Information  Thank you for taking time to visit with me today. Please don't hesitate to contact me if I can be of assistance to you before our next scheduled appointment.  Your next care management appointment is by telephone on 08/24/24 at 1 pm with Hendricks Her RN CM for Dr Cox's office     Please call the care guide team at [phone] if you need to cancel, schedule, or reschedule an appointment.   Please  if you are experiencing a Mental Health or Behavioral Health Crisis or need someone to talk to.  Kadeshia Kasparian L. Ramonita, RN, BSN, CCM [place]  [facility], [facility] Health RN Care Manager Direct Dial: [phone]  Fax: [phone]

## 2024-08-01 NOTE — Patient Outreach (Signed)
 Complex Care Management   Visit Note  08/01/2024  Name:  Victoria Pena MRN: [id number] DOB: 1951-01-03  Situation: Referral received for Complex Care Management related to Depression and Anxiety I obtained verbal consent from Patient.  Visit completed with [name]  on the phone  Background:   Past Medical History:  Diagnosis Date   Acquired spondylolisthesis 05/12/2015   Adenocarcinoma of right lung, stage 1 (HCC) 07/26/2016   Anxiety    Arthritis    Arthropathy of lumbar facet joint 04/11/2019   Formatting of this note might be different from the original. Added automatically from request for surgery [id number]   Atherosclerosis of aorta (HCC) 07/08/2021   Benign essential hypertension 12/27/2016   BMI 30.0-30.9,adult 12/23/2020   COPD (chronic obstructive pulmonary disease) (HCC)    pt denies   Current mild episode of major depressive disorder (HCC) 03/24/2020   Daytime sleepiness 08/03/2021   Depression    Fibromyalgia 03/24/2020   Gastroesophageal reflux disease without esophagitis 03/24/2020   Graves disease 12/27/2016   Hypertension    Osteoporosis 08/03/2021   Pericardial cyst 02/02/2021   Postoperative hypothyroidism 01/17/2015   Reactive airways dysfunction syndrome, unspecified asthma severity, uncomplicated (HCC) 09/06/2015   S/P lobectomy of lung 07/26/2015   Formatting of this note might be different from the original. Right upper   S/P lumbar spinal fusion 05/11/2018   Thyroid  disease    Toe ulcer, left, limited to breakdown of skin (HCC) 12/23/2020    Assessment: Patient Reported Symptoms:  Cognitive Cognitive Status: Alert and oriented to person, place, and time, Normal speech and language skills Cognitive/Intellectual Conditions Management [RPT]: None reported or documented in medical history or problem list   Health Maintenance Behaviors: Social activities, Annual physical exam Healing Pattern: Unsure Health Facilitated by: Rest  Neurological  Neurological Review of Symptoms: No symptoms reported Neurological Self-Management Outcome: 4 (good)  HEENT HEENT Symptoms Reported: No symptoms reported HEENT Self-Management Outcome: 4 (good)    Cardiovascular Cardiovascular Symptoms Reported: No symptoms reported Does patient have uncontrolled Hypertension?: Yes Is patient checking Blood Pressure at home?: Yes Patient's Recent BP reading at home: she reports her systolic BP is in the 120's Cardiovascular Management Strategies: Medical device, Adequate rest Cardiovascular Self-Management Outcome: 4 (good)  Respiratory Respiratory Symptoms Reported: No symptoms reported Other Respiratory Symptoms: Reports she is doing better and has no COPD signs nor symptoms Respiratory Self-Management Outcome: 4 (good)  Endocrine Endocrine Symptoms Reported: No symptoms reported Is patient diabetic?: No Endocrine Self-Management Outcome: 4 (good)  Gastrointestinal Gastrointestinal Symptoms Reported: No symptoms reported Gastrointestinal Self-Management Outcome: 4 (good)    Genitourinary Genitourinary Symptoms Reported: No symptoms reported Genitourinary Self-Management Outcome: 4 (good)  Integumentary Integumentary Symptoms Reported: No symptoms reported Skin Self-Management Outcome: 4 (good)  Musculoskeletal Musculoskelatal Symptoms Reviewed: No symptoms reported Musculoskeletal Self-Management Outcome: 4 (good) Falls in the past year?: No Number of falls in past year: 1 or less Was there an injury with Fall?: No Fall Risk Category Calculator: 0 Patient Fall Risk Level: Low Fall Risk Fall risk Follow up: Falls evaluation completed  Psychosocial Psychosocial Symptoms Reported: No symptoms reported Additional Psychological Details: confirmed she is back to work and does not prefer ongoing counseling Behavioral Management Strategies: Medication therapy Behavioral Health Self-Management Outcome: 4 (good) Major Change/Loss/Stressor/Fears (CP):  Denies Techniques to Cope with Loss/Stress/Change: Diversional activities, Medication Quality of Family Relationships: supportive Do you feel physically threatened by others?: No      08/01/2024    2:44 PM  Depression  screen PHQ 2/9  Decreased Interest 0  Down, Depressed, Hopeless 0  PHQ - 2 Score 0    There were no vitals filed for this visit.  Medications Reviewed Today   Medications were not reviewed in this encounter     Recommendation:   PCP Follow-up Specialty provider follow-up dentist on 08/23/24, selinda benne Continue Current Plan of Care  Follow Up Plan:   Telephone follow up appointment date/time:  08/24/24 1 pm by Hendricks Kay Iha L. Ramonita, RN, BSN, CCM [place]  [facility], [facility] Health RN Care Manager Direct Dial: [phone]  Fax: [phone]

## 2024-08-01 NOTE — Patient Outreach (Signed)
 Patient returned a call to RN CCM prior to RN CCM completing this documentation note & sending to CMA for rescheduling   Victoria Pena L. Ramonita, RN, BSN, CCM [place]  [facility], [facility] RN Care Manager Direct Dial: [phone]  Fax: [phone]

## 2024-08-09 ENCOUNTER — Other Ambulatory Visit: Payer: Self-pay | Admitting: Family Medicine

## 2024-08-09 DIAGNOSIS — F418 Other specified anxiety disorders: Secondary | ICD-10-CM

## 2024-08-13 NOTE — Discharge Instructions (Signed)

## 2024-08-14 ENCOUNTER — Ambulatory Visit
Admission: RE | Admit: 2024-08-14 | Discharge: 2024-08-14 | Disposition: A | Discharge status: Home / Self Care | Source: Ambulatory Visit | Attending: Neurological Surgery | Admitting: Neurological Surgery

## 2024-08-14 DIAGNOSIS — M4317 Spondylolisthesis, lumbosacral region: Secondary | ICD-10-CM

## 2024-08-14 DIAGNOSIS — M4316 Spondylolisthesis, lumbar region: Secondary | ICD-10-CM | POA: Diagnosis not present

## 2024-08-14 MED ORDER — MEPERIDINE HCL 50 MG/ML IJ SOLN: 50.0000 mg | Freq: Once | INTRAMUSCULAR | Status: DC | PRN

## 2024-08-14 MED ORDER — ONDANSETRON HCL 4 MG/2ML IJ SOLN: 4.0000 mg | Freq: Once | INTRAMUSCULAR | Status: DC | PRN

## 2024-08-14 MED ORDER — IOPAMIDOL (ISOVUE-M 200) INJECTION 41%
20.0000 mL | Freq: Once | INTRAMUSCULAR | Status: AC
  Administered 2024-08-14: 20 mL via INTRATHECAL

## 2024-08-14 MED ORDER — DIAZEPAM 5 MG PO TABS: 5.0000 mg | ORAL_TABLET | Freq: Once | ORAL | Status: DC

## 2024-08-17 ENCOUNTER — Ambulatory Visit

## 2024-08-17 ENCOUNTER — Other Ambulatory Visit: Payer: Self-pay | Admitting: Family Medicine

## 2024-08-17 DIAGNOSIS — F418 Other specified anxiety disorders: Secondary | ICD-10-CM

## 2024-08-23 ENCOUNTER — Other Ambulatory Visit: Payer: Self-pay

## 2024-08-23 ENCOUNTER — Other Ambulatory Visit: Payer: Self-pay | Admitting: Family Medicine

## 2024-08-23 DIAGNOSIS — F418 Other specified anxiety disorders: Secondary | ICD-10-CM

## 2024-08-23 MED ORDER — BUPROPION HCL ER (XL) 300 MG PO TB24: 300.0000 mg | ORAL_TABLET | Freq: Every day | ORAL | 0 refills | Status: DC

## 2024-08-23 MED ORDER — CELECOXIB 200 MG PO CAPS: 200.0000 mg | ORAL_CAPSULE | Freq: Every day | ORAL | 0 refills | Status: DC

## 2024-08-24 ENCOUNTER — Other Ambulatory Visit: Payer: Self-pay | Admitting: Family Medicine

## 2024-08-24 ENCOUNTER — Telehealth: Payer: Self-pay

## 2024-08-24 DIAGNOSIS — I10 Essential (primary) hypertension: Secondary | ICD-10-CM

## 2024-08-28 ENCOUNTER — Telehealth: Payer: Self-pay

## 2024-08-28 NOTE — Patient Instructions (Signed)
 Nathanel BIRCH [place] - I am sorry I was unable to reach you today for our scheduled appointment. I work with Cox, Abigail, MD and am calling to support your healthcare needs. Please contact me at [phone] at your earliest convenience if you have any questions about the rescheduled date/time  08-31-2024 at 11:00 AM . I look forward to speaking with you soon.   Thank you,  Hendricks Her RN, BSN  [place] I VBCI-Population Health RN Case Manager   Direct [phone]

## 2024-08-29 ENCOUNTER — Other Ambulatory Visit: Payer: Self-pay | Admitting: Family Medicine

## 2024-08-29 DIAGNOSIS — M545 Low back pain, unspecified: Secondary | ICD-10-CM

## 2024-08-29 DIAGNOSIS — H25813 Combined forms of age-related cataract, bilateral: Secondary | ICD-10-CM | POA: Diagnosis not present

## 2024-08-29 NOTE — Telephone Encounter (Unsigned)
 Copied from CRM #[id number]. Topic: Clinical - Medication Refill >> Aug 29, 2024  3:17 PM Rachelle R wrote: Medication: Oxycodone  HCl 10 MG TABS  Has the patient contacted their pharmacy? Yes, call dr  This is the patient's preferred pharmacy:  [id number] [place], [place] - [address] AT [facility] [address] [place] [address] Phone: [phone] Fax: [phone]  Is this the correct pharmacy for this prescription? Yes If no, delete pharmacy and type the correct one.   Has the prescription been filled recently? No  Is the patient out of the medication? Yes  Has the patient been seen for an appointment in the last year OR does the patient have an upcoming appointment? Yes  Can we respond through MyChart? Yes  Agent: Please be advised that Rx refills may take up to 3 business days. We ask that you follow-up with your pharmacy.

## 2024-08-30 ENCOUNTER — Other Ambulatory Visit: Payer: Self-pay | Admitting: Family Medicine

## 2024-08-30 ENCOUNTER — Ambulatory Visit

## 2024-08-30 DIAGNOSIS — M4317 Spondylolisthesis, lumbosacral region: Secondary | ICD-10-CM | POA: Diagnosis not present

## 2024-08-30 DIAGNOSIS — Z6829 Body mass index (BMI) 29.0-29.9, adult: Secondary | ICD-10-CM | POA: Diagnosis not present

## 2024-08-31 ENCOUNTER — Other Ambulatory Visit: Payer: Self-pay

## 2024-08-31 MED ORDER — OXYCODONE HCL 10 MG PO TABS: 10.0000 mg | ORAL_TABLET | Freq: Three times a day (TID) | ORAL | 0 refills | Status: DC | PRN

## 2024-08-31 NOTE — Patient Instructions (Signed)
 Visit Information  Thank you for taking time to visit with me today. Please don't hesitate to contact me if I can be of assistance to you before our next scheduled appointment.  Your next care management appointment is by telephone on 10-02-2024 at 11:00  Telephone follow-up in 1 month  Please call the care guide team at [phone] if you need to cancel, schedule, or reschedule an appointment.   Please call the Suicide and Crisis Lifeline: 988 call the [place]  National Suicide Prevention Lifeline: 907-269-2749 or TTY: (434)730-4264 TTY [phone]) to talk to a trained counselor call 1-800-273-TALK (toll free, 24 hour hotline) call 911 if you are experiencing a Mental Health or Behavioral Health Crisis or need someone to talk to.  Hendricks Her RN, BSN  [place] I VBCI-Population Health RN Case Information systems manager [phone]

## 2024-09-04 ENCOUNTER — Ambulatory Visit

## 2024-09-04 ENCOUNTER — Ambulatory Visit (INDEPENDENT_AMBULATORY_CARE_PROVIDER_SITE_OTHER)

## 2024-09-04 DIAGNOSIS — Z23 Encounter for immunization: Secondary | ICD-10-CM

## 2024-09-04 NOTE — Progress Notes (Signed)
 Patient is in office today for a nurse visit for Immunization. Patient Injection was given in the  Right deltoid. Patient tolerated injection well.

## 2024-09-05 ENCOUNTER — Other Ambulatory Visit: Payer: Self-pay | Admitting: Family Medicine

## 2024-09-05 DIAGNOSIS — M797 Fibromyalgia: Secondary | ICD-10-CM

## 2024-09-11 DIAGNOSIS — H25811 Combined forms of age-related cataract, right eye: Secondary | ICD-10-CM | POA: Diagnosis not present

## 2024-09-11 DIAGNOSIS — Z01818 Encounter for other preprocedural examination: Secondary | ICD-10-CM | POA: Diagnosis not present

## 2024-09-13 DIAGNOSIS — M5416 Radiculopathy, lumbar region: Secondary | ICD-10-CM | POA: Diagnosis not present

## 2024-09-14 ENCOUNTER — Other Ambulatory Visit: Payer: Self-pay | Admitting: Family Medicine

## 2024-09-14 DIAGNOSIS — M810 Age-related osteoporosis without current pathological fracture: Secondary | ICD-10-CM

## 2024-09-14 DIAGNOSIS — M797 Fibromyalgia: Secondary | ICD-10-CM

## 2024-09-18 DIAGNOSIS — F419 Anxiety disorder, unspecified: Secondary | ICD-10-CM | POA: Diagnosis not present

## 2024-09-18 DIAGNOSIS — H259 Unspecified age-related cataract: Secondary | ICD-10-CM | POA: Diagnosis not present

## 2024-09-18 DIAGNOSIS — H52223 Regular astigmatism, bilateral: Secondary | ICD-10-CM | POA: Diagnosis not present

## 2024-09-18 DIAGNOSIS — E1136 Type 2 diabetes mellitus with diabetic cataract: Secondary | ICD-10-CM | POA: Diagnosis not present

## 2024-09-18 DIAGNOSIS — E0500 Thyrotoxicosis with diffuse goiter without thyrotoxic crisis or storm: Secondary | ICD-10-CM | POA: Diagnosis not present

## 2024-09-18 DIAGNOSIS — I10 Essential (primary) hypertension: Secondary | ICD-10-CM | POA: Diagnosis not present

## 2024-09-18 DIAGNOSIS — J439 Emphysema, unspecified: Secondary | ICD-10-CM | POA: Diagnosis not present

## 2024-09-18 DIAGNOSIS — F32A Depression, unspecified: Secondary | ICD-10-CM | POA: Diagnosis not present

## 2024-09-18 DIAGNOSIS — H25811 Combined forms of age-related cataract, right eye: Secondary | ICD-10-CM | POA: Diagnosis not present

## 2024-09-18 DIAGNOSIS — K219 Gastro-esophageal reflux disease without esophagitis: Secondary | ICD-10-CM | POA: Diagnosis not present

## 2024-09-26 ENCOUNTER — Ambulatory Visit

## 2024-09-28 ENCOUNTER — Other Ambulatory Visit: Payer: Self-pay | Admitting: Family Medicine

## 2024-09-28 DIAGNOSIS — M545 Low back pain, unspecified: Secondary | ICD-10-CM

## 2024-09-28 NOTE — Telephone Encounter (Signed)
 Copied from CRM #[id number]. Topic: Clinical - Medication Refill >> Sep 28, 2024 11:59 AM Winona R wrote: Medication: Oxycodone  HCl 10 MG TABS   Has the patient contacted their pharmacy? Yes (Agent: If no, request that the patient contact the pharmacy for the refill. If patient does not wish to contact the pharmacy document the reason why and proceed with request.) (Agent: If yes, when and what did the pharmacy advise?)  This is the patient's preferred pharmacy:  [id number] [place], [place] - [address] AT [facility] [address] [place] [address] Phone: [phone] Fax: [phone]  Is this the correct pharmacy for this prescription? Yes If no, delete pharmacy and type the correct one.   Has the prescription been filled recently? Yes  Is the patient out of the medication? No  Has the patient been seen for an appointment in the last year OR does the patient have an upcoming appointment? Yes  Can we respond through MyChart? Yes  Agent: Please be advised that Rx refills may take up to 3 business days. We ask that you follow-up with your pharmacy.

## 2024-10-01 ENCOUNTER — Telehealth: Payer: Self-pay | Admitting: Family Medicine

## 2024-10-01 MED ORDER — OXYCODONE HCL 10 MG PO TABS
10.0000 mg | ORAL_TABLET | Freq: Three times a day (TID) | ORAL | 0 refills | Status: DC | PRN
Start: 2024-10-01 — End: 2024-10-25

## 2024-10-01 NOTE — Telephone Encounter (Unsigned)
 Copied from CRM [id number]. Topic: Clinical - Medication Question >> Oct 01, 2024  2:19 PM Donee H wrote: Reason for CRM: Patient calling regarding FORTEO  600 MCG/2.4ML SOPN. She would like to speak with dr. Sherre or nurse regarding the financial assistance for the medication. She states she has one box left. Please follow up with patient  [phone]

## 2024-10-02 ENCOUNTER — Telehealth

## 2024-10-02 DIAGNOSIS — H52223 Regular astigmatism, bilateral: Secondary | ICD-10-CM | POA: Diagnosis not present

## 2024-10-02 DIAGNOSIS — E1136 Type 2 diabetes mellitus with diabetic cataract: Secondary | ICD-10-CM | POA: Diagnosis not present

## 2024-10-02 DIAGNOSIS — H04123 Dry eye syndrome of bilateral lacrimal glands: Secondary | ICD-10-CM | POA: Diagnosis not present

## 2024-10-02 DIAGNOSIS — H43813 Vitreous degeneration, bilateral: Secondary | ICD-10-CM | POA: Diagnosis not present

## 2024-10-02 DIAGNOSIS — H259 Unspecified age-related cataract: Secondary | ICD-10-CM | POA: Diagnosis not present

## 2024-10-02 DIAGNOSIS — H18513 Endothelial corneal dystrophy, bilateral: Secondary | ICD-10-CM | POA: Diagnosis not present

## 2024-10-02 DIAGNOSIS — H25812 Combined forms of age-related cataract, left eye: Secondary | ICD-10-CM | POA: Diagnosis not present

## 2024-10-02 DIAGNOSIS — J439 Emphysema, unspecified: Secondary | ICD-10-CM | POA: Diagnosis not present

## 2024-10-02 DIAGNOSIS — I10 Essential (primary) hypertension: Secondary | ICD-10-CM | POA: Diagnosis not present

## 2024-10-02 DIAGNOSIS — H353131 Nonexudative age-related macular degeneration, bilateral, early dry stage: Secondary | ICD-10-CM | POA: Diagnosis not present

## 2024-10-03 ENCOUNTER — Telehealth: Payer: Self-pay | Admitting: Family Medicine

## 2024-10-03 NOTE — Telephone Encounter (Signed)
 I left a message on the number(s) listed in the patients chart requesting the patient to call back regarding the upcomming appointment for 10/23/2024. The provider is out of the office that day. The appointment has been canceled. Waiting for the patient to return the call.   NOTE: If the patient does not call back within a week to reschedule this appointment, the front staff will mail the patient a letter requesting to call the office back.

## 2024-10-04 ENCOUNTER — Other Ambulatory Visit: Payer: Self-pay

## 2024-10-04 NOTE — Patient Instructions (Signed)
 Nathanel BIRCH [place] - I am sorry I was unable to reach you today for our scheduled appointment. I work with Cox, Abigail, MD and am calling to support your healthcare needs. I have scheduled you for next Wednesday 10-10-2024 at 11:00 AM  Please contact me at [phone] if you need to change this appointment . I look forward to speaking with you soon.   Thank you,  Hendricks Her RN, BSN  [place] I VBCI-Population Health RN Case Manager   Direct [phone]

## 2024-10-08 ENCOUNTER — Ambulatory Visit

## 2024-10-08 ENCOUNTER — Other Ambulatory Visit: Payer: Self-pay | Admitting: Family Medicine

## 2024-10-09 ENCOUNTER — Other Ambulatory Visit: Payer: Self-pay | Admitting: Family Medicine

## 2024-10-09 DIAGNOSIS — C3491 Malignant neoplasm of unspecified part of right bronchus or lung: Secondary | ICD-10-CM

## 2024-10-09 DIAGNOSIS — E890 Postprocedural hypothyroidism: Secondary | ICD-10-CM

## 2024-10-10 ENCOUNTER — Telehealth: Payer: Self-pay

## 2024-10-10 NOTE — Patient Instructions (Signed)
 Nathanel BIRCH [place] - I am sorry I was unable to reach you today for our scheduled appointment. I work with Cox, Abigail, MD and am calling to support your healthcare needs. Please contact me at [phone] at your earliest convenience. I look forward to speaking with you soon.   Thank you,  Hendricks Her RN, BSN  [place] I VBCI-Population Health RN Case Manager   Direct [phone]

## 2024-10-11 ENCOUNTER — Ambulatory Visit: Payer: Self-pay

## 2024-10-11 ENCOUNTER — Ambulatory Visit

## 2024-10-11 VITALS — Ht 65.0 in | Wt 170.0 lb

## 2024-10-11 DIAGNOSIS — Z0000 Encounter for general adult medical examination without abnormal findings: Secondary | ICD-10-CM

## 2024-10-11 NOTE — Telephone Encounter (Signed)
 Attempted contact x 1, no answer, will call patient back at a later time to further discuss symptoms.         Copied from CRM #[id number]. Topic: General - Other >> Oct 11, 2024 12:55 PM Cleave MATSU wrote: Reason for CRM: pt wants to cancel the telephone appt for today she said she really dont feel good so she dont wanna do the telephone appt

## 2024-10-11 NOTE — Progress Notes (Signed)
 Subjective:   Victoria Pena is a 73 y.o. who presents for a Medicare Wellness preventive visit.  As a reminder, Annual Wellness Visits don't include a physical exam, and some assessments may be limited, especially if this visit is performed virtually. We may recommend an in-person follow-up visit with your provider if needed.  Visit Complete: Virtual I connected with  Victoria Pena on 10/11/24 by a audio enabled telemedicine application and verified that I am speaking with the correct person using two identifiers.  Patient Location: Home  Provider Location: Home Office  I discussed the limitations of evaluation and management by telemedicine. The patient expressed understanding and agreed to proceed.  Vital Signs: Because this visit was a virtual/telehealth visit, some criteria may be missing or patient reported. Any vitals not documented were not able to be obtained and vitals that have been documented are patient reported.  VideoDeclined- This patient declined Librarian, academic. Therefore the visit was completed with audio only.  Persons Participating in Visit: Patient.  AWV Questionnaire: No: Patient Medicare AWV questionnaire was not completed prior to this visit.  Cardiac Risk Factors include: advanced age (>51men, >26 women);dyslipidemia;hypertension     Objective:    Today's Vitals   10/11/24 1331  Weight: 170 lb (77.1 kg)  Height: 5' 5 (1.651 m)   Body mass index is 28.29 kg/m.     10/11/2024    1:49 PM 08/31/2024    2:17 PM 10/06/2023    1:14 PM 04/27/2022    3:07 PM 11/06/2021    2:30 PM 01/25/2019    9:03 AM 05/11/2018    4:20 PM  Advanced Directives  Does Patient Have a Medical Advance Directive? No No No Yes No No  No   Does patient want to make changes to medical advance directive?    No - Patient declined     Would patient like information on creating a medical advance directive? Yes (MAU/Ambulatory/Procedural Areas -  Information given)  No - Patient declined    No - Patient declined      Data saved with a previous flowsheet row definition    Current Medications (verified) Outpatient Encounter Medications as of 10/11/2024  Medication Sig   ALPRAZolam  (XANAX ) 0.5 MG tablet TAKE 1 TABLET(0.5 MG) BY MOUTH TWICE DAILY AS NEEDED FOR ANXIETY   atorvastatin  (LIPITOR) 40 MG tablet TAKE 1 TABLET(40 MG) BY MOUTH DAILY   buPROPion  (WELLBUTRIN  XL) 300 MG 24 hr tablet Take 1 tablet (300 mg total) by mouth daily.   Calcium  Carb-Cholecalciferol  (CALCIUM  500 + D3) 500-5 MG-MCG TABS Take 3 tablets by mouth daily.   calcium  carbonate (TUMS - DOSED IN MG ELEMENTAL CALCIUM ) 500 MG chewable tablet Chew 1 tablet by mouth 2 (two) times daily as needed for indigestion or heartburn.   celecoxib  (CELEBREX ) 200 MG capsule TAKE 1 CAPSULE(200 MG) BY MOUTH DAILY   EPINEPHrine  0.3 mg/0.3 mL IJ SOAJ injection Inject 0.3 mg into the muscle as needed for anaphylaxis.   escitalopram  (LEXAPRO ) 10 MG tablet TAKE 1 TABLET(10 MG) BY MOUTH DAILY   famotidine  (PEPCID ) 40 MG tablet TAKE 1 TABLET(40 MG) BY MOUTH DAILY   FORTEO  600 MCG/2.4ML SOPN Inject 20 mcg into the skin daily.   Insulin  Pen Needle 32G X 4 MM MISC 1 each by Does not apply route daily.   levothyroxine  (SYNTHROID ) 112 MCG tablet TAKE 1 TABLET(112 MCG) BY MOUTH DAILY BEFORE BREAKFAST   losartan  (COZAAR ) 50 MG tablet TAKE 1 TABLET(50 MG) BY  MOUTH DAILY   Multiple Vitamin (MULTIVITAMIN WITH MINERALS) TABS tablet Take 1 tablet by mouth in the morning and at bedtime.   naloxone  (NARCAN ) nasal spray 4 mg/0.1 mL Initial, 1 spray (10 mg) intranasally into 1 nostril. If patients does not respond within 2 to 3 minutes, or responds and then relapses into respiratory depression, administer another dose in the other nostril with a new device. CALL 911.   Oxycodone  HCl 10 MG TABS Take 1 tablet (10 mg total) by mouth 3 (three) times daily as needed (severe back pain).   pantoprazole  (PROTONIX ) 40  MG tablet TAKE 1 TABLET EVERY DAY   pregabalin  (LYRICA ) 150 MG capsule TAKE 1 CAPSULE(150 MG) BY MOUTH TWICE DAILY   Teriparatide  560 MCG/2.24ML SOPN INJECT 20 MCG UNDER THE SKIN DAILY   trolamine salicylate (ASPERCREME) 10 % cream Apply 1 Application topically as needed for muscle pain.   Vitamin D , Ergocalciferol , (DRISDOL ) 1.25 MG (50000 UNIT) CAPS capsule TAKE 1 CAPSULE BY MOUTH 2 TIMES A WEEK   No facility-administered encounter medications on file as of 10/11/2024.    Allergies (verified) Cephalexin, Hydroxyzine, Methocarbamol , Hydrocodone , Hydrocodone -acetaminophen , Prednisone, Prednisone & diphenhydramine , Ciprofloxacin, Dextromethorphan, Dextromethorphan polistirex er, Meloxicam , Nitrofurantoin, Pravastatin , Amlodipine , Diphenhydramine , Diphenhydramine  hcl, and Moxifloxacin   History: Past Medical History:  Diagnosis Date   Acquired spondylolisthesis 05/12/2015   Adenocarcinoma of right lung, stage 1 (HCC) 07/26/2016   Anxiety    Arthritis    Arthropathy of lumbar facet joint 04/11/2019   Formatting of this note might be different from the original. Added automatically from request for surgery [id number]   Atherosclerosis of aorta 07/08/2021   Benign essential hypertension 12/27/2016   BMI 30.0-30.9,adult 12/23/2020   COPD (chronic obstructive pulmonary disease) (HCC)    pt denies   Current mild episode of major depressive disorder 03/24/2020   Daytime sleepiness 08/03/2021   Depression    Fibromyalgia 03/24/2020   Gastroesophageal reflux disease without esophagitis 03/24/2020   Graves disease 12/27/2016   Hypertension    Osteoporosis 08/03/2021   Pericardial cyst 02/02/2021   Postoperative hypothyroidism 01/17/2015   Reactive airways dysfunction syndrome, unspecified asthma severity, uncomplicated (HCC) 09/06/2015   S/P lobectomy of lung 07/26/2015   Formatting of this note might be different from the original. Right upper   S/P lumbar spinal fusion 05/11/2018   Thyroid   disease    Toe ulcer, left, limited to breakdown of skin (HCC) 12/23/2020   Past Surgical History:  Procedure Laterality Date   ANKLE SURGERY     bilateral   APPENDECTOMY     BREAST BIOPSY Left 02/25/2020   BREAST BIOPSY Right 08/17/2018   BREAST EXCISIONAL BIOPSY Right    unsure when but marked with scar marker   CHOLECYSTECTOMY     EYE MUSCLE SURGERY Bilateral    4-5 years ago   LAMINECTOMY WITH POSTERIOR LATERAL ARTHRODESIS LEVEL 2 N/A 01/21/2023   Procedure: Laminectomy  - L1-L2 - L2-L3 with non-instrumented posterior lateral fusion;  Surgeon: Joshua Alm RAMAN, MD;  Location: [facility] OR;  Service: Neurosurgery;  Laterality: N/A;   LUMBAR FUSION  2019   L4, 5, 6   REPLACEMENT TOTAL KNEE Left    THYROIDECTOMY     Family History  Problem Relation Age of Onset   Cancer Mother    Cancer Father    Cancer Brother    Autism Grandson    Breast cancer Neg Hx    Social History   Socioeconomic History   Marital status: Widowed    Spouse  name: Not on file   Number of children: Not on file   Years of education: Not on file   Highest education level: GED or equivalent  Occupational History   Not on file  Tobacco Use   Smoking status: Former    Current packs/day: 0.00    Average packs/day: 1.5 packs/day for 35.0 years (52.5 ttl pk-yrs)    Types: Cigarettes    Start date: 07/26/1964    Quit date: 07/27/1999    Years since quitting: 25.2   Smokeless tobacco: Never  Vaping Use   Vaping status: Never Used  Substance and Sexual Activity   Alcohol use: No   Drug use: No   Sexual activity: Not Currently  Other Topics Concern   Not on file  Social History Narrative   Not on file   Social Drivers of Health   Financial Resource Strain: Medium Risk (10/11/2024)   Overall Financial Resource Strain (CARDIA)    Difficulty of Paying Living Expenses: Somewhat hard  Food Insecurity: No Food Insecurity (10/11/2024)   Hunger Vital Sign    Worried About Running Out of Food in the Last Year:  Never true    Ran Out of Food in the Last Year: Never true  Transportation Needs: No Transportation Needs (10/11/2024)   PRAPARE - Administrator, Civil Service (Medical): No    Lack of Transportation (Non-Medical): No  Physical Activity: Sufficiently Active (10/11/2024)   Exercise Vital Sign    Days of Exercise per Week: 4 days    Minutes of Exercise per Session: 40 min  Stress: Stress Concern Present (10/11/2024)   [employer] of Occupational Health - Occupational Stress Questionnaire    Feeling of Stress: To some extent  Social Connections: Moderately Isolated (10/11/2024)   Social Connection and Isolation Panel    Frequency of Communication with Friends and Family: More than three times a week    Frequency of Social Gatherings with Friends and Family: Three times a week    Attends Religious Services: Never    Active Member of Clubs or Organizations: Yes    Attends Banker Meetings: 1 to 4 times per year    Marital Status: Widowed    Tobacco Counseling Counseling given: Not Answered    Clinical Intake:  Pre-visit preparation completed: Yes  Pain : No/denies pain  Diabetes: No  How often do you need to have someone help you when you read instructions, pamphlets, or other written materials from your doctor or pharmacy?: 1 - Never  Interpreter Needed?: No  Information entered by :: Charmaine Bloodgood LPN   Activities of Daily Living     10/11/2024    1:47 PM  In your present state of health, do you have any difficulty performing the following activities:  Hearing? 0  Vision? 0  Difficulty concentrating or making decisions? 0  Walking or climbing stairs? 0  Dressing or bathing? 0  Doing errands, shopping? 0  Preparing Food and eating ? N  Using the Toilet? N  In the past six months, have you accidently leaked urine? N  Do you have problems with loss of bowel control? N  Managing your Medications? N  Managing your Finances? N   Housekeeping or managing your Housekeeping? N    Patient Care Team: Sherre Clapper, MD as PCP - General (Family Medicine) Clement Odor, LCSW as Social Worker (Licensed Clinical Social Worker) Kay, Hendricks MATSU, RN as [facility], Provider Not In as Referring Physician (Dentistry)  Thyra Therisa FALCON, MD as Referring Physician (Ophthalmology) Joshua Alm Hamilton, MD as Consulting Physician (Neurosurgery) Anderson Alm NOVAK, OD (Optometry)  I have updated your Care Teams any recent Medical Services you may have received from other providers in the past year.     Assessment:   This is a routine wellness examination for Victoria Pena.  Hearing/Vision screen Hearing Screening - Comments:: Patient is able to hear conversational tones without difficulty. No issues reported.    Vision Screening - Comments:: Wears rx glasses - up to date with routine eye exams with Alm Anderson    Goals Addressed             This Visit's Progress    Prevent falls   On track      Depression Screen     10/11/2024    1:46 PM 08/31/2024   11:41 AM 08/01/2024    2:44 PM 07/18/2024    2:37 PM 04/17/2024    1:40 PM 04/03/2024    1:51 PM 11/24/2023    1:50 PM  PHQ 2/9 Scores  PHQ - 2 Score 1 1 0 0 4  3  PHQ- 9 Score 10   9 19  14   Exception Documentation      --     Fall Risk     10/11/2024    1:47 PM 08/31/2024   11:08 AM 08/01/2024    3:02 PM 05/29/2024    1:33 PM 04/03/2024    1:49 PM  Fall Risk   Falls in the past year? 0 1 0 0 0  Comment  Fell over great grandchilds ball     Number falls in past yr: 0 0 0    Injury with Fall? 0 0 0    Risk for fall due to : Impaired mobility Impaired mobility  Impaired mobility   Follow up Falls prevention discussed;Education provided;Falls evaluation completed Falls evaluation completed;Education provided;Falls prevention discussed Falls evaluation completed Falls evaluation completed;Falls prevention discussed;Education provided     MEDICARE  RISK AT HOME:  Medicare Risk at Home Any stairs in or around the home?: No If so, are there any without handrails?: No Home free of loose throw rugs in walkways, pet beds, electrical cords, etc?: Yes Adequate lighting in your home to reduce risk of falls?: Yes Life alert?: No Use of a cane, walker or w/c?: No Grab bars in the bathroom?: Yes Shower chair or bench in shower?: No Elevated toilet seat or a handicapped toilet?: No  TIMED UP AND GO:  Was the test performed?  No  Cognitive Function: 6CIT completed        10/11/2024    1:49 PM 10/06/2023    1:14 PM 04/27/2022    3:10 PM  6CIT Screen  What Year? 0 points 0 points 0 points  What month? 0 points 0 points 0 points  What time? 0 points 0 points 0 points  Count back from 20 0 points 0 points 0 points  Months in reverse 0 points 0 points 0 points  Repeat phrase 0 points 0 points 0 points  Total Score 0 points 0 points 0 points    Immunizations Immunization History  Administered Date(s) Administered   Fluad Quad(high Dose 65+) 09/23/2020, 10/27/2021, 10/12/2022   Fluad Trivalent(High Dose 65+) 11/03/2023   Influenza, Seasonal, Injecte, Preservative Fre 09/04/2024   Influenza-Unspecified 08/25/2018   Moderna SARS-COV2 Booster Vaccination 02/10/2021   Moderna Sars-Covid-2 Vaccination 01/14/2020, 03/03/2020   PNEUMOCOCCAL CONJUGATE-20 10/26/2022   Pfizer(Comirnaty)Fall Seasonal Vaccine 12  years and older 10/26/2022, 11/03/2023   Pneumococcal Polysaccharide-23 09/23/2020    Screening Tests Health Maintenance  Topic Date Due   DTaP/Tdap/Td (1 - Tdap) Never done   Zoster Vaccines- Shingrix (1 of 2) Never done   Mammogram  12/30/2023   COVID-19 Vaccine (5 - 2025-26 season) 08/20/2024   Fecal DNA (Cologuard)  06/23/2025   Medicare Annual Wellness (AWV)  10/11/2025   Pneumococcal Vaccine: 50+ Years  Completed   Influenza Vaccine  Completed   DEXA SCAN  Completed   Hepatitis C Screening  Completed   Meningococcal B  Vaccine  Aged Out    Health Maintenance Items Addressed: Vaccines Due: Shingrix and TDap, Mammogram scheduled for 11/13/24  Additional Screening:  Vision Screening: Recommended annual ophthalmology exams for early detection of glaucoma and other disorders of the eye. Is the patient up to date with their annual eye exam?  Yes  Who is the provider or what is the name of the office in which the patient attends annual eye exams? Dr. Anderson  Dental Screening: Recommended annual dental exams for proper oral hygiene  Community Resource Referral / Chronic Care Management: CRR required this visit?  No   CCM required this visit?  No   Plan:    I have personally reviewed and noted the following in the patient's chart:   Medical and social history Use of alcohol, tobacco or illicit drugs  Current medications and supplements including opioid prescriptions. Patient is currently taking opioid prescriptions. Information provided to patient regarding non-opioid alternatives. Patient advised to discuss non-opioid treatment plan with their provider. Functional ability and status Nutritional status Physical activity Advanced directives List of other physicians Hospitalizations, surgeries, and ER visits in previous 12 months Vitals Screenings to include cognitive, depression, and falls Referrals and appointments  In addition, I have reviewed and discussed with patient certain preventive protocols, quality metrics, and best practice recommendations. A written personalized care plan for preventive services as well as general preventive health recommendations were provided to patient.   Lavelle Pfeiffer [place], [place]   [id number]/[id number]   After Visit Summary: (MyChart) Due to this being a telephonic visit, the after visit summary with patients personalized plan was offered to patient via MyChart   Notes: Nothing significant to report at this time.

## 2024-10-11 NOTE — Telephone Encounter (Signed)
 Attempted contact x 3 to discuss symptoms;  LVM. Will route to office as contact attempts were unsuccessful.               Copied from CRM #[id number]. Topic: General - Other >> Oct 11, 2024 12:55 PM Cleave MATSU wrote: Reason for CRM: pt wants to cancel the telephone appt for today she said she really dont feel good so she dont wanna do the telephone appt

## 2024-10-11 NOTE — Patient Instructions (Addendum)
 Victoria Pena,  Thank you for taking the time for your Medicare Wellness Visit. I appreciate your continued commitment to your health goals. Please review the care plan we discussed, and feel free to reach out if I can assist you further.  Medicare recommends these wellness visits once per year to help you and your care team stay ahead of potential health issues. These visits are designed to focus on prevention, allowing your provider to concentrate on managing your acute and chronic conditions during your regular appointments.  Please note that Annual Wellness Visits do not include a physical exam. Some assessments may be limited, especially if the visit was conducted virtually. If needed, we may recommend a separate in-person follow-up with your provider.  Ongoing Care Seeing your primary care provider every 3 to 6 months helps us  monitor your health and provide consistent, personalized care.   Referrals If a referral was made during today's visit and you haven't received any updates within two weeks, please contact the referred provider directly to check on the status.  You have an order for:  []   2D Mammogram  [x]   3D Mammogram  []   Bone Density    Scheduled for 11/13/24  @ 3:20 [address]  [place], [place] [address] ([facility])   [facility] M[facility]o [facility] [phone]    Make sure to wear two-piece clothing.  No lotions, powders, or deodorants the day of the appointment. Make sure to bring picture ID and insurance card.  Bring list of medications you are currently taking including any supplements.   Recommended Screenings:  Health Maintenance  Topic Date Due   DTaP/Tdap/Td vaccine (1 - Tdap) Never done   Zoster (Shingles) Vaccine (1 of 2) Never done   Breast Cancer Screening  12/30/2023   COVID-19 Vaccine (5 - 2025-26 season) 08/20/2024   Cologuard (Stool DNA test)  06/23/2025   Medicare Annual Wellness Visit  10/11/2025   Pneumococcal Vaccine  for age over 27  Completed   Flu Shot  Completed   DEXA scan (bone density measurement)  Completed   Hepatitis C Screening  Completed   Meningitis B Vaccine  Aged Out       10/11/2024    1:49 PM  Advanced Directives  Does Patient Have a Medical Advance Directive? No  Would patient like information on creating a medical advance directive? Yes (MAU/Ambulatory/Procedural Areas - Information given)   Advance Care Planning is important because it: Ensures you receive medical care that aligns with your values, goals, and preferences. Provides guidance to your family and loved ones, reducing the emotional burden of decision-making during critical moments.  Information on Advanced Care Planning can be found at [place]  Secretary of [facility] Advance Health Care Directives Advance Health Care Directives ([email or web address]   Vision: Annual vision screenings are recommended for early detection of glaucoma, cataracts, and diabetic retinopathy. These exams can also reveal signs of chronic conditions such as diabetes and high blood pressure.  Dental: Annual dental screenings help detect early signs of oral cancer, gum disease, and other conditions linked to overall health, including heart disease and diabetes.  Please see the attached documents for additional preventive care recommendations.

## 2024-10-11 NOTE — Telephone Encounter (Signed)
 Attempted contact x 2 to discuss symptoms; upon looking in the chart, patient checked into AWV appointment. LVM. Will attempt to contact patient at a later time to discuss symptoms.         Copied from CRM #[id number]. Topic: General - Other >> Oct 11, 2024 12:55 PM Cleave MATSU wrote: Reason for CRM: pt wants to cancel the telephone appt for today she said she really dont feel good so she dont wanna do the telephone appt

## 2024-10-15 ENCOUNTER — Other Ambulatory Visit: Payer: Self-pay

## 2024-10-23 ENCOUNTER — Ambulatory Visit: Admitting: Family Medicine

## 2024-10-25 ENCOUNTER — Encounter: Payer: Self-pay | Admitting: Family Medicine

## 2024-10-25 ENCOUNTER — Ambulatory Visit: Payer: Self-pay

## 2024-10-25 ENCOUNTER — Ambulatory Visit (INDEPENDENT_AMBULATORY_CARE_PROVIDER_SITE_OTHER): Admitting: Family Medicine

## 2024-10-25 ENCOUNTER — Other Ambulatory Visit: Payer: Self-pay

## 2024-10-25 VITALS — BP 120/78 | HR 82 | Temp 97.2°F | Resp 16 | Ht 65.0 in | Wt 168.0 lb

## 2024-10-25 DIAGNOSIS — M545 Low back pain, unspecified: Secondary | ICD-10-CM

## 2024-10-25 DIAGNOSIS — G8929 Other chronic pain: Secondary | ICD-10-CM | POA: Diagnosis not present

## 2024-10-25 DIAGNOSIS — M810 Age-related osteoporosis without current pathological fracture: Secondary | ICD-10-CM | POA: Diagnosis not present

## 2024-10-25 MED ORDER — KETOROLAC TROMETHAMINE 60 MG/2ML IM SOLN: 60.0000 mg | Freq: Once | INTRAMUSCULAR | Status: DC

## 2024-10-25 MED ORDER — OXYCODONE HCL 10 MG PO TABS: 10.0000 mg | ORAL_TABLET | Freq: Four times a day (QID) | ORAL | 0 refills | Status: DC | PRN

## 2024-10-25 NOTE — Progress Notes (Unsigned)
 Subjective:  Patient ID: Victoria Pena, female    DOB: June 14, 1951  Age: 73 y.o. MRN: [id number]  Chief Complaint  Patient presents with   Back Pain    Discussed the use of AI scribe software for clinical note transcription with the patient, who gave verbal consent to proceed.  History of Present Illness   GLENOLA WHEAT Victoria Pena is a 73 year old female with chronic back pain and a history of spinal fusions who presents with worsening back pain radiating to the leg.  Lumbosacral radicular pain - Worsening lower back pain over the past four days - Pain radiates to the buttock, groin, and down the leg - Pain is severe and disrupts sleep - No relief with ice, heat, or current medication regimen - No recent hard labor or activities that could have exacerbated pain  Chronic neuropathic symptoms - Chronic numbness in feet, present prior to first spinal surgery - Difficulty sleeping due to pain  History of spinal surgery and interventions - History of two spinal fusions, most recent in 2019 - Received back injections a few months ago - Surgeon advised that further surgery is not possible at this time due to severe osteoporosis  Osteoporosis management - Severe osteoporosis - On Forteo  injections for one year - Concerned about ability to continue Forteo  due to insurance issues - Reports she is hopeful that her osteoporosis has improved because she needs spine surgery again.  Analgesic medication use and limitations - Current medications: ibuprofen 800 mg as needed, Lyrica  twice daily, oxycodone  four times daily - Oxycodone  supply depleted early due to increased pain; next refill available on the 12th  Anxiety and medication side effects - History of anxiety disorder - Prednisone exacerbates anxiety and causes tachycardia         10/11/2024    1:46 PM 08/31/2024   11:41 AM 08/01/2024    2:44 PM 07/18/2024    2:37 PM 04/17/2024    1:40 PM  Depression screen PHQ 2/9  Decreased  Interest 1 1 0 0 2  Down, Depressed, Hopeless 0  0 0 2  PHQ - 2 Score 1 1 0 0 4  Altered sleeping 3   3 3   Tired, decreased energy 1   1 2   Change in appetite 3   3 3   Feeling bad or failure about yourself  0   0 1  Trouble concentrating 2   2 3   Moving slowly or fidgety/restless 0   0 3  Suicidal thoughts 0   0 0  PHQ-9 Score 10    9  19    Difficult doing work/chores    Not difficult at all Not difficult at all     Data saved with a previous flowsheet row definition        10/11/2024    1:47 PM  Fall Risk   Falls in the past year? 0  Number falls in past yr: 0  Injury with Fall? 0  Risk for fall due to : Impaired mobility  Follow up Falls prevention discussed;Education provided;Falls evaluation completed    Patient Care Team: Sherre Clapper, MD as PCP - General (Family Medicine) Clement Odor, LCSW as Social Worker (Licensed Clinical Social Worker) Kay, Hendricks MATSU, RN as [facility], Provider Not In as Referring Physician (Dentistry) Thyra Therisa FALCON, MD as Referring Physician (Ophthalmology) Joshua Alm Hamilton, MD as Consulting Physician (Neurosurgery) Anderson Alm NOVAK, OD (Optometry)   Review of Systems  Constitutional:  Negative for  chills, fatigue and fever.  HENT:  Negative for congestion, ear pain and sore throat.   Respiratory:  Negative for cough and shortness of breath.   Cardiovascular:  Negative for chest pain and palpitations.  Gastrointestinal:  Negative for abdominal pain, constipation, diarrhea, nausea and vomiting.  Endocrine: Negative for polydipsia, polyphagia and polyuria.  Genitourinary:  Negative for difficulty urinating and dysuria.  Musculoskeletal:  Positive for back pain (radiating to the right buttucks and right leg). Negative for arthralgias and myalgias.  Skin:  Negative for rash.  Neurological:  Negative for headaches.  Psychiatric/Behavioral:  Negative for dysphoric mood. The patient is not nervous/anxious.     Current  Outpatient Medications on File Prior to Visit  Medication Sig Dispense Refill   ALPRAZolam  (XANAX ) 0.5 MG tablet TAKE 1 TABLET(0.5 MG) BY MOUTH TWICE DAILY AS NEEDED FOR ANXIETY 60 tablet 5   atorvastatin  (LIPITOR) 40 MG tablet TAKE 1 TABLET(40 MG) BY MOUTH DAILY 90 tablet 0   buPROPion  (WELLBUTRIN  XL) 300 MG 24 hr tablet Take 1 tablet (300 mg total) by mouth daily. 90 tablet 0   Calcium  Carb-Cholecalciferol  (CALCIUM  500 + D3) 500-5 MG-MCG TABS Take 3 tablets by mouth daily. 100 tablet 5   calcium  carbonate (TUMS - DOSED IN MG ELEMENTAL CALCIUM ) 500 MG chewable tablet Chew 1 tablet by mouth 2 (two) times daily as needed for indigestion or heartburn.     celecoxib  (CELEBREX ) 200 MG capsule TAKE 1 CAPSULE(200 MG) BY MOUTH DAILY 90 capsule 1   EPINEPHrine  0.3 mg/0.3 mL IJ SOAJ injection Inject 0.3 mg into the muscle as needed for anaphylaxis. 1 each 1   escitalopram  (LEXAPRO ) 10 MG tablet TAKE 1 TABLET(10 MG) BY MOUTH DAILY 90 tablet 1   famotidine  (PEPCID ) 40 MG tablet TAKE 1 TABLET(40 MG) BY MOUTH DAILY 90 tablet 1   FORTEO  600 MCG/2.4ML SOPN Inject 20 mcg into the skin daily. 216 mL 1   Insulin  Pen Needle 32G X 4 MM MISC 1 each by Does not apply route daily. 100 each 3   levothyroxine  (SYNTHROID ) 112 MCG tablet TAKE 1 TABLET(112 MCG) BY MOUTH DAILY BEFORE BREAKFAST 90 tablet 0   losartan  (COZAAR ) 50 MG tablet TAKE 1 TABLET(50 MG) BY MOUTH DAILY 90 tablet 1   Multiple Vitamin (MULTIVITAMIN WITH MINERALS) TABS tablet Take 1 tablet by mouth in the morning and at bedtime.     naloxone  (NARCAN ) nasal spray 4 mg/0.1 mL Initial, 1 spray (10 mg) intranasally into 1 nostril. If patients does not respond within 2 to 3 minutes, or responds and then relapses into respiratory depression, administer another dose in the other nostril with a new device. CALL 911. 2 each 1   pantoprazole  (PROTONIX ) 40 MG tablet TAKE 1 TABLET EVERY DAY 90 tablet 3   pregabalin  (LYRICA ) 150 MG capsule TAKE 1 CAPSULE(150 MG) BY MOUTH  TWICE DAILY 180 capsule 1   Teriparatide  560 MCG/2.24ML SOPN INJECT 20 MCG UNDER THE SKIN DAILY 2.4 mL 1   trolamine salicylate (ASPERCREME) 10 % cream Apply 1 Application topically as needed for muscle pain.     Vitamin D , Ergocalciferol , (DRISDOL ) 1.25 MG (50000 UNIT) CAPS capsule TAKE 1 CAPSULE BY MOUTH 2 TIMES A WEEK 24 capsule 0   acetaminophen  (TYLENOL ) 500 MG tablet Take 500 mg by mouth.     No current facility-administered medications on file prior to visit.   Past Medical History:  Diagnosis Date   Acquired spondylolisthesis 05/12/2015   Adenocarcinoma of right lung, stage 1 (HCC)  07/26/2016   Anxiety    Arthritis    Arthropathy of lumbar facet joint 04/11/2019   Formatting of this note might be different from the original. Added automatically from request for surgery [id number]   Atherosclerosis of aorta 07/08/2021   Benign essential hypertension 12/27/2016   BMI 30.0-30.9,adult 12/23/2020   COPD (chronic obstructive pulmonary disease) (HCC)    pt denies   Current mild episode of major depressive disorder 03/24/2020   Daytime sleepiness 08/03/2021   Depression    Fibromyalgia 03/24/2020   Gastroesophageal reflux disease without esophagitis 03/24/2020   Graves disease 12/27/2016   Hypertension    Osteoporosis 08/03/2021   Pericardial cyst 02/02/2021   Postoperative hypothyroidism 01/17/2015   Reactive airways dysfunction syndrome, unspecified asthma severity, uncomplicated (HCC) 09/06/2015   S/P lobectomy of lung 07/26/2015   Formatting of this note might be different from the original. Right upper   S/P lumbar spinal fusion 05/11/2018   Thyroid  disease    Toe ulcer, left, limited to breakdown of skin (HCC) 12/23/2020   Past Surgical History:  Procedure Laterality Date   ANKLE SURGERY     bilateral   APPENDECTOMY     BREAST BIOPSY Left 02/25/2020   BREAST BIOPSY Right 08/17/2018   BREAST EXCISIONAL BIOPSY Right    unsure when but marked with scar marker    CHOLECYSTECTOMY     EYE MUSCLE SURGERY Bilateral    4-5 years ago   LAMINECTOMY WITH POSTERIOR LATERAL ARTHRODESIS LEVEL 2 N/A 01/21/2023   Procedure: Laminectomy  - L1-L2 - L2-L3 with non-instrumented posterior lateral fusion;  Surgeon: Joshua Alm RAMAN, MD;  Location: [facility] OR;  Service: Neurosurgery;  Laterality: N/A;   LUMBAR FUSION  2019   L4, 5, 6   REPLACEMENT TOTAL KNEE Left    THYROIDECTOMY      Family History  Problem Relation Age of Onset   Cancer Mother    Cancer Father    Cancer Brother    Autism Grandson    Breast cancer Neg Hx    Social History   Socioeconomic History   Marital status: Widowed    Spouse name: Not on file   Number of children: Not on file   Years of education: Not on file   Highest education level: GED or equivalent  Occupational History   Not on file  Tobacco Use   Smoking status: Former    Current packs/day: 0.00    Average packs/day: 1.5 packs/day for 35.0 years (52.5 ttl pk-yrs)    Types: Cigarettes    Start date: 07/26/1964    Quit date: 07/27/1999    Years since quitting: 25.2   Smokeless tobacco: Never  Vaping Use   Vaping status: Never Used  Substance and Sexual Activity   Alcohol use: No   Drug use: No   Sexual activity: Not Currently  Other Topics Concern   Not on file  Social History Narrative   Not on file   Social Drivers of Health   Financial Resource Strain: Medium Risk (10/11/2024)   Overall Financial Resource Strain (CARDIA)    Difficulty of Paying Living Expenses: Somewhat hard  Food Insecurity: No Food Insecurity (10/11/2024)   Hunger Vital Sign    Worried About Running Out of Food in the Last Year: Never true    Ran Out of Food in the Last Year: Never true  Transportation Needs: No Transportation Needs (10/11/2024)   PRAPARE - Transportation    Lack of Transportation (Medical): No    Lack  of Transportation (Non-Medical): No  Physical Activity: Sufficiently Active (10/11/2024)   Exercise Vital Sign    Days of  Exercise per Week: 4 days    Minutes of Exercise per Session: 40 min  Stress: Stress Concern Present (10/11/2024)   [employer] of Occupational Health - Occupational Stress Questionnaire    Feeling of Stress: To some extent  Social Connections: Moderately Isolated (10/11/2024)   Social Connection and Isolation Panel    Frequency of Communication with Friends and Family: More than three times a week    Frequency of Social Gatherings with Friends and Family: Three times a week    Attends Religious Services: Never    Active Member of Clubs or Organizations: Yes    Attends Banker Meetings: 1 to 4 times per year    Marital Status: Widowed    Objective:  BP 120/78   Pulse 82   Temp (!) 97.2 F (36.2 C)   Resp 16   Ht 5' 5 (1.651 m)   Wt 168 lb (76.2 kg)   LMP  (LMP Unknown)   SpO2 99%   BMI 27.96 kg/m      10/25/2024   11:02 AM 10/11/2024    1:31 PM 08/31/2024   11:20 AM  BP/Weight  Systolic BP 120 --   Diastolic BP 78 --   Wt. (Lbs) 168 170 170  BMI 27.96 kg/m2 28.29 kg/m2 28.29 kg/m2    Physical Exam Vitals reviewed.  Constitutional:      General: She is not in acute distress.    Appearance: Normal appearance.  Eyes:     Conjunctiva/sclera: Conjunctivae normal.  Cardiovascular:     Rate and Rhythm: Normal rate and regular rhythm.     Heart sounds: Normal heart sounds. No murmur heard. Pulmonary:     Effort: Pulmonary effort is normal. No respiratory distress.     Breath sounds: Normal breath sounds.  Musculoskeletal:     Lumbar back: Tenderness present. Decreased range of motion.     Right hip: Tenderness present. Decreased range of motion.  Neurological:     Mental Status: She is alert and oriented to person, place, and time. Mental status is at baseline.  Psychiatric:        Mood and Affect: Mood normal.        Behavior: Behavior normal.    Lab Results  Component Value Date   WBC 6.0 07/18/2024   HGB 13.3 07/18/2024   HCT 42.5  07/18/2024   PLT 242 07/18/2024   GLUCOSE 96 07/18/2024   CHOL 143 07/18/2024   TRIG 72 07/18/2024   HDL 83 07/18/2024   LDLCALC 46 07/18/2024   ALT 17 07/18/2024   AST 17 07/18/2024   NA 142 07/18/2024   K 5.1 07/18/2024   CL 103 07/18/2024   CREATININE 0.84 07/18/2024   BUN 20 07/18/2024   CO2 25 07/18/2024   TSH 0.306 (L) 07/18/2024   INR 1.0 12/31/2022    Results for orders placed or performed in visit on 07/18/24  POCT URINALYSIS DIP (CLINITEK)   Collection Time: 07/18/24  2:54 PM  Result Value Ref Range   Color, UA yellow yellow   Clarity, UA clear clear   Glucose, UA negative negative mg/dL   Bilirubin, UA negative negative   Ketones, POC UA negative negative mg/dL   Spec Grav, UA 8.984 8.989 - 1.025   Blood, UA negative negative   pH, UA 6.0 5.0 - 8.0   POC PROTEIN,UA negative negative,  trace   Urobilinogen, UA 0.2 0.2 or 1.0 E.U./dL   Nitrite, UA Negative Negative   Leukocytes, UA Small (1+) (A) Negative  Urine Culture   Collection Time: 07/18/24  2:55 PM   Specimen: Urine   UR  Result Value Ref Range   Urine Culture, Routine Final report    Organism ID, Bacteria Comment   TSH   Collection Time: 07/18/24  3:53 PM  Result Value Ref Range   TSH 0.306 (L) 0.450 - 4.500 uIU/mL  Lipid panel   Collection Time: 07/18/24  3:53 PM  Result Value Ref Range   Cholesterol, Total 143 100 - 199 mg/dL   Triglycerides 72 0 - 149 mg/dL   HDL 83 >60 mg/dL   VLDL Cholesterol Cal 14 5 - 40 mg/dL   LDL Chol Calc (NIH) 46 0 - 99 mg/dL   Chol/HDL Ratio 1.7 0.0 - 4.4 ratio  Comprehensive metabolic panel with GFR   Collection Time: 07/18/24  3:53 PM  Result Value Ref Range   Glucose 96 70 - 99 mg/dL   BUN 20 8 - 27 mg/dL   Creatinine, Ser 9.15 0.57 - 1.00 mg/dL   eGFR 73 >40 fO/fpw/8.26   BUN/Creatinine Ratio 24 12 - 28   Sodium 142 134 - 144 mmol/L   Potassium 5.1 3.5 - 5.2 mmol/L   Chloride 103 96 - 106 mmol/L   CO2 25 20 - 29 mmol/L   Calcium  9.9 8.7 - 10.3 mg/dL    Total Protein 6.4 6.0 - 8.5 g/dL   Albumin 4.1 3.8 - 4.8 g/dL   Globulin, Total 2.3 1.5 - 4.5 g/dL   Bilirubin Total 0.5 0.0 - 1.2 mg/dL   Alkaline Phosphatase 191 (H) 44 - 121 IU/L   AST 17 0 - 40 IU/L   ALT 17 0 - 32 IU/L  CBC with Differential/Platelet   Collection Time: 07/18/24  3:53 PM  Result Value Ref Range   WBC 6.0 3.4 - 10.8 x10E3/uL   RBC 4.77 3.77 - 5.28 x10E6/uL   Hemoglobin 13.3 11.1 - 15.9 g/dL   Hematocrit 57.4 65.9 - 46.6 %   MCV 89 79 - 97 fL   MCH 27.9 26.6 - 33.0 pg   MCHC 31.3 (L) 31.5 - 35.7 g/dL   RDW 86.0 88.2 - 84.5 %   Platelets 242 150 - 450 x10E3/uL   Neutrophils 64 Not Estab. %   Lymphs 25 Not Estab. %   Monocytes 9 Not Estab. %   Eos 1 Not Estab. %   Basos 1 Not Estab. %   Neutrophils Absolute 3.9 1.4 - 7.0 x10E3/uL   Lymphocytes Absolute 1.5 0.7 - 3.1 x10E3/uL   Monocytes Absolute 0.5 0.1 - 0.9 x10E3/uL   EOS (ABSOLUTE) 0.1 0.0 - 0.4 x10E3/uL   Basophils Absolute 0.1 0.0 - 0.2 x10E3/uL   Immature Granulocytes 0 Not Estab. %   Immature Grans (Abs) 0.0 0.0 - 0.1 x10E3/uL  C-reactive protein   Collection Time: 07/18/24  3:53 PM  Result Value Ref Range   CRP <1 0 - 10 mg/L  Sedimentation rate   Collection Time: 07/18/24  3:53 PM  Result Value Ref Range   Sed Rate 9 0 - 40 mm/hr  Pain Mgt Scrn (14 Drugs), Ur   Collection Time: 07/18/24  4:05 PM  Result Value Ref Range   Amphetamine Scrn, Ur Negative Cutoff=1000 ng/mL   BARBITURATE SCREEN URINE Negative Cutoff=200 ng/mL   BENZODIAZEPINE SCREEN, URINE Negative Cutoff=200 ng/mL   CANNABINOIDS UR  QL SCN Negative Cutoff=20 ng/mL   Cocaine (Metab) Scrn, Ur Negative Cutoff=300 ng/mL   Opiate Scrn, Ur Negative Cutoff=300 ng/mL   OXYCODONE +OXYMORPHONE UR QL SCN Positive (A) Cutoff=100 ng/mL   Phencyclidine Qn, Ur Negative Cutoff=25 ng/mL   Methadone Screen, Urine Negative Cutoff=300 ng/mL   Propoxyphene Scrn, Ur Negative Cutoff=300 ng/mL   Meperidine  Screen, Urine Negative Cutoff=200 ng/mL    Fentanyl , Urine Negative Cutoff=2000 pg/mL   Tramadol  Screen, Urine Negative Cutoff=200 ng/mL   Buprenorphine, Urine Negative Cutoff=10 ng/mL   Creatinine(Crt), U 23.2 20.0 - 300.0 mg/dL   Ph of Urine 5.8 4.5 - 8.9   PLEASE NOTE: Comment   .  Assessment & Plan:   Assessment & Plan Chronic midline low back pain without sciatica Chronic low back pain with bilateral radiculopathy Exacerbation of chronic low back pain with sciatica. Previous spinal fusions and steroid injections. Increased oxycodone  use due to pain. Prednisone not tolerated. Toradol  injection considered for pain relief. - Administered Toradol  injection for pain relief. - Increased oxycodone  to 10 mg four times daily, total 120 tablets. - Advised adherence to oxycodone  dosage to avoid pain clinic referral.  Increase oxycodone  to 10 mg four times a day as needed.  Keep follow up with Dr. Joshua.   Toradol  was ordered, but Jon Birmingham, CMA accidentally gave Phenergan  50 mg IM.  Dr. Sherre discussed error with patient and she was very understanding. I discussed potential side effects, such as somnolence.  Patient agreed to go directly home. Dr. Sherre, Personally, called Victoria Pena and she arrived home safely.     Age-related osteoporosis without current pathological fracture Severe osteoporosis Severe osteoporosis complicates surgical options. Currently on Forteo  injections with insurance and cost concerns. Future Glea injections discussed post-Forteo . - Continue Forteo  injections until year-end. - Discuss with Dr. Sherre regarding Forteo  continuation and transition to Glea injections.      Meds ordered this encounter  Medications   DISCONTD: ketorolac  (TORADOL ) injection 60 mg     Follow-up: Return in about 3 months (around 01/25/2025) for chronic, lab visit.  An After Visit Summary was printed and given to the patient.  Harrie Cedar, FNP [facility] [phone]

## 2024-10-25 NOTE — Telephone Encounter (Signed)
 FYI Only or Action Required?: FYI only for provider: appointment scheduled on 10/25/2024.  Patient was last seen in primary care on 07/18/2024 by Sherre Clapper, MD.  Called Nurse Triage reporting Back Pain - radiates down right leg and into groin.  Symptoms began several days ago.  Interventions attempted: Prescription medications: oxycodone  and IBU.  Symptoms are: gradually worsening.  Triage Disposition: See HCP Within 4 Hours (Or PCP Triage)  Patient/caregiver understands and will follow disposition?: Yes                 Copied from CRM #[id number]. Topic: Clinical - Red Word Triage >> Oct 25, 2024  9:06 AM Winona R wrote: Pain in her waist to her buttoock and to her groin, radiating pain down right thigh. For three days. Pt had lumbar surgery twice in the past. Taking 800 mg of ibuprofen which does not help, and oxycodone  4 times a day (if she takes half the pill it will take the edge off but if she takes the entire pill it does get rid of the pain) however she goes through it too fast. Reason for Disposition  [1] SEVERE back pain (e.g., excruciating, unable to do any normal activities) AND [2] not improved 2 hours after pain medicine  Answer Assessment - Initial Assessment Questions 1. ONSET: When did the pain begin? (e.g., minutes, hours, days)     A couple of days 2. LOCATION: Where does it hurt? (upper, mid or lower back)     Waist to buttocks  to groin and down right leg 3. SEVERITY: How bad is the pain?  (e.g., Scale 1-10; mild, moderate, or severe)     Severe 4. PATTERN: Is the pain constant? (e.g., yes, no; constant, intermittent)      constant 5. RADIATION: Does the pain shoot into your legs or somewhere else?     Right leg 6. CAUSE:  What do you think is causing the back pain?      unsure 7. BACK OVERUSE:  Any recent lifting of heavy objects, strenuous work or exercise?     no 8. MEDICINES: What have you taken so far for the pain? (e.g.,  nothing, acetaminophen , NSAIDS)     IBU and oxycodone  9. NEUROLOGIC SYMPTOMS: Do you have any weakness, numbness, or problems with bowel/bladder control?     no 10. OTHER SYMPTOMS: Do you have any other symptoms? (e.g., fever, abdomen pain, burning with urination, blood in urine)       no  Protocols used: Back Pain-A-AH

## 2024-10-25 NOTE — Assessment & Plan Note (Signed)
 Increase oxycodone  to 10 mg four times a day as needed.  Keep follow up with Dr. Joshua.   Toradol  was ordered, but Jon Birmingham, CMA accidentally gave Phenergan  50 mg IM.  Dr. Sherre discussed error with patient and she was very understanding. I discussed potential side effects, such as somnolence.  Patient agreed to go directly home. Dr. Sherre, Personally, called [name] and she arrived home safely.

## 2024-10-26 NOTE — Assessment & Plan Note (Signed)
 Severe osteoporosis Severe osteoporosis complicates surgical options. Currently on Forteo  injections with insurance and cost concerns. Future Glea injections discussed post-Forteo . - Continue Forteo  injections until year-end. - Discuss with Dr. Sherre regarding Forteo  continuation and transition to Glea injections.

## 2024-11-01 DIAGNOSIS — H524 Presbyopia: Secondary | ICD-10-CM | POA: Diagnosis not present

## 2024-11-13 ENCOUNTER — Encounter

## 2024-11-13 ENCOUNTER — Telehealth: Payer: Self-pay

## 2024-11-13 NOTE — Telephone Encounter (Signed)
 PAP: Patient assistance application for Forteo  through [employer] has been mailed to pt's home address on file. Provider portion of application will be faxed to provider's office.  Provider portion has been faxed to Dr. Abigail Free  Reorder was initiated on 11/25 per request of patient

## 2024-11-19 NOTE — Telephone Encounter (Signed)
 Received provider portion of application

## 2024-11-20 ENCOUNTER — Ambulatory Visit: Admitting: Family Medicine

## 2024-11-20 ENCOUNTER — Encounter: Payer: Self-pay | Admitting: Family Medicine

## 2024-11-20 VITALS — BP 124/78 | HR 78 | Temp 98.1°F | Ht 65.0 in | Wt 171.0 lb

## 2024-11-20 DIAGNOSIS — N3000 Acute cystitis without hematuria: Secondary | ICD-10-CM | POA: Diagnosis not present

## 2024-11-20 LAB — POCT URINALYSIS DIP (CLINITEK)
Bilirubin, UA: NEGATIVE
Blood, UA: NEGATIVE
Glucose, UA: NEGATIVE mg/dL
Ketones, POC UA: NEGATIVE mg/dL
Nitrite, UA: POSITIVE — AB
POC PROTEIN,UA: NEGATIVE
Spec Grav, UA: 1.015 (ref 1.010–1.025)
Urobilinogen, UA: 0.2 U/dL
pH, UA: 6 (ref 5.0–8.0)

## 2024-11-20 MED ORDER — SULFAMETHOXAZOLE-TRIMETHOPRIM 800-160 MG PO TABS: 1.0000 | ORAL_TABLET | Freq: Two times a day (BID) | ORAL | 0 refills | Status: DC

## 2024-11-20 MED ORDER — PHENAZOPYRIDINE HCL 200 MG PO TABS: 200.0000 mg | ORAL_TABLET | Freq: Three times a day (TID) | ORAL | 0 refills | Status: DC | PRN

## 2024-11-20 NOTE — Assessment & Plan Note (Signed)
  Orders:   POCT URINALYSIS DIP (CLINITEK)   Urine Culture

## 2024-11-20 NOTE — Progress Notes (Unsigned)
 Acute Office Visit  Subjective:    Patient ID: Victoria Pena, female    DOB: 1951/10/20, 73 y.o.   MRN: [id number]  Chief Complaint  Patient presents with   Urinary Tract Infection    Dysuria, urinary urgency and frequency x 2 weeks    Discussed the use of AI scribe software for clinical note transcription with the patient, who gave verbal consent to proceed.  History of Present Illness [name] [name] is a 73 year old female who presents with symptoms of a urinary tract infection.  Lower urinary tract symptoms - Dysuria present for two weeks, partially improved but persistent - Urinary urgency for two weeks - Occasional malodorous urine, associated with bladder infections  Flank pain - Worsening flank pain  Systemic symptoms - No nausea, vomiting, fevers, or chills  Sexual activity - No sexual activity for twelve years since becoming a widow  Prior urinary tract infection management - Previous urinary tract infections treated effectively with Bactrim     Past Medical History:  Diagnosis Date   Acquired spondylolisthesis 05/12/2015   Adenocarcinoma of right lung, stage 1 (HCC) 07/26/2016   Anxiety    Arthritis    Arthropathy of lumbar facet joint 04/11/2019   Formatting of this note might be different from the original. Added automatically from request for surgery [id number]   Atherosclerosis of aorta 07/08/2021   Benign essential hypertension 12/27/2016   BMI 30.0-30.9,adult 12/23/2020   COPD (chronic obstructive pulmonary disease) (HCC)    pt denies   Current mild episode of major depressive disorder 03/24/2020   Daytime sleepiness 08/03/2021   Depression    Fibromyalgia 03/24/2020   Gastroesophageal reflux disease without esophagitis 03/24/2020   Graves disease 12/27/2016   Hypertension    Osteoporosis 08/03/2021   Pericardial cyst 02/02/2021   Postoperative hypothyroidism 01/17/2015   Reactive airways dysfunction syndrome, unspecified asthma severity,  uncomplicated (HCC) 09/06/2015   S/P lobectomy of lung 07/26/2015   Formatting of this note might be different from the original. Right upper   S/P lumbar spinal fusion 05/11/2018   Thyroid  disease    Toe ulcer, left, limited to breakdown of skin (HCC) 12/23/2020    Past Surgical History:  Procedure Laterality Date   ANKLE SURGERY     bilateral   APPENDECTOMY     BREAST BIOPSY Left 02/25/2020   BREAST BIOPSY Right 08/17/2018   BREAST EXCISIONAL BIOPSY Right    unsure when but marked with scar marker   CHOLECYSTECTOMY     EYE MUSCLE SURGERY Bilateral    4-5 years ago   LAMINECTOMY WITH POSTERIOR LATERAL ARTHRODESIS LEVEL 2 N/A 01/21/2023   Procedure: Laminectomy  - L1-L2 - L2-L3 with non-instrumented posterior lateral fusion;  Surgeon: Joshua Alm RAMAN, MD;  Location: [facility] OR;  Service: Neurosurgery;  Laterality: N/A;   LUMBAR FUSION  2019   L4, 5, 6   REPLACEMENT TOTAL KNEE Left    THYROIDECTOMY      Family History  Problem Relation Age of Onset   Cancer Mother    Cancer Father    Cancer Brother    Autism Grandson    Breast cancer Neg Hx     Social History   Socioeconomic History   Marital status: Widowed    Spouse name: Not on file   Number of children: Not on file   Years of education: Not on file   Highest education level: GED or equivalent  Occupational History   Not on file  Tobacco Use  Smoking status: Former    Current packs/day: 0.00    Average packs/day: 1.5 packs/day for 35.0 years (52.5 ttl pk-yrs)    Types: Cigarettes    Start date: 07/26/1964    Quit date: 07/27/1999    Years since quitting: 25.3   Smokeless tobacco: Never  Vaping Use   Vaping status: Never Used  Substance and Sexual Activity   Alcohol use: No   Drug use: No   Sexual activity: Not Currently  Other Topics Concern   Not on file  Social History Narrative   Not on file   Social Drivers of Health   Financial Resource Strain: Medium Risk (10/11/2024)   Overall Financial Resource  Strain (CARDIA)    Difficulty of Paying Living Expenses: Somewhat hard  Food Insecurity: No Food Insecurity (10/11/2024)   Hunger Vital Sign    Worried About Running Out of Food in the Last Year: Never true    Ran Out of Food in the Last Year: Never true  Transportation Needs: No Transportation Needs (10/11/2024)   PRAPARE - Administrator, Civil Service (Medical): No    Lack of Transportation (Non-Medical): No  Physical Activity: Sufficiently Active (10/11/2024)   Exercise Vital Sign    Days of Exercise per Week: 4 days    Minutes of Exercise per Session: 40 min  Stress: Stress Concern Present (10/11/2024)   [employer] of Occupational Health - Occupational Stress Questionnaire    Feeling of Stress: To some extent  Social Connections: Moderately Isolated (10/11/2024)   Social Connection and Isolation Panel    Frequency of Communication with Friends and Family: More than three times a week    Frequency of Social Gatherings with Friends and Family: Three times a week    Attends Religious Services: Never    Active Member of Clubs or Organizations: Yes    Attends Banker Meetings: 1 to 4 times per year    Marital Status: Widowed  Intimate Partner Violence: Not At Risk (10/11/2024)   Humiliation, Afraid, Rape, and Kick questionnaire    Fear of Current or Ex-Partner: No    Emotionally Abused: No    Physically Abused: No    Sexually Abused: No    Outpatient Medications Prior to Visit  Medication Sig Dispense Refill   acetaminophen  (TYLENOL ) 500 MG tablet Take 500 mg by mouth.     ALPRAZolam  (XANAX ) 0.5 MG tablet TAKE 1 TABLET(0.5 MG) BY MOUTH TWICE DAILY AS NEEDED FOR ANXIETY 60 tablet 5   atorvastatin  (LIPITOR) 40 MG tablet TAKE 1 TABLET(40 MG) BY MOUTH DAILY 90 tablet 0   buPROPion  (WELLBUTRIN  XL) 300 MG 24 hr tablet Take 1 tablet (300 mg total) by mouth daily. 90 tablet 0   Calcium  Carb-Cholecalciferol  (CALCIUM  500 + D3) 500-5 MG-MCG TABS Take 3  tablets by mouth daily. 100 tablet 5   calcium  carbonate (TUMS - DOSED IN MG ELEMENTAL CALCIUM ) 500 MG chewable tablet Chew 1 tablet by mouth 2 (two) times daily as needed for indigestion or heartburn.     celecoxib  (CELEBREX ) 200 MG capsule TAKE 1 CAPSULE(200 MG) BY MOUTH DAILY 90 capsule 1   EPINEPHrine  0.3 mg/0.3 mL IJ SOAJ injection Inject 0.3 mg into the muscle as needed for anaphylaxis. 1 each 1   escitalopram  (LEXAPRO ) 10 MG tablet TAKE 1 TABLET(10 MG) BY MOUTH DAILY 90 tablet 1   famotidine  (PEPCID ) 40 MG tablet TAKE 1 TABLET(40 MG) BY MOUTH DAILY 90 tablet 1   FORTEO  600 MCG/2.4ML SOPN  Inject 20 mcg into the skin daily. 216 mL 1   Insulin  Pen Needle 32G X 4 MM MISC 1 each by Does not apply route daily. 100 each 3   levothyroxine  (SYNTHROID ) 112 MCG tablet TAKE 1 TABLET(112 MCG) BY MOUTH DAILY BEFORE BREAKFAST 90 tablet 0   losartan  (COZAAR ) 50 MG tablet TAKE 1 TABLET(50 MG) BY MOUTH DAILY 90 tablet 1   Multiple Vitamin (MULTIVITAMIN WITH MINERALS) TABS tablet Take 1 tablet by mouth in the morning and at bedtime.     naloxone  (NARCAN ) nasal spray 4 mg/0.1 mL Initial, 1 spray (10 mg) intranasally into 1 nostril. If patients does not respond within 2 to 3 minutes, or responds and then relapses into respiratory depression, administer another dose in the other nostril with a new device. CALL 911. 2 each 1   Oxycodone  HCl 10 MG TABS Take 1 tablet (10 mg total) by mouth 4 (four) times daily as needed (severe back pain). 120 tablet 0   pantoprazole  (PROTONIX ) 40 MG tablet TAKE 1 TABLET EVERY DAY 90 tablet 3   pregabalin  (LYRICA ) 150 MG capsule TAKE 1 CAPSULE(150 MG) BY MOUTH TWICE DAILY 180 capsule 1   Teriparatide  560 MCG/2.24ML SOPN INJECT 20 MCG UNDER THE SKIN DAILY 2.4 mL 1   trolamine salicylate (ASPERCREME) 10 % cream Apply 1 Application topically as needed for muscle pain.     Vitamin D , Ergocalciferol , (DRISDOL ) 1.25 MG (50000 UNIT) CAPS capsule TAKE 1 CAPSULE BY MOUTH 2 TIMES A WEEK 24  capsule 0   No facility-administered medications prior to visit.    Allergies  Allergen Reactions   Cephalexin Shortness Of Breath    Hard time breathing  Other Reaction(s): SOB, Unknown  Patient did not state.    Hard time breathing   Dextromethorphan Hbr Shortness Of Breath   Hydroxyzine Other (See Comments), Swelling, Dermatitis and Hives    Numbness in lips  Other Reaction(s): Hypersensitivity finding (finding), Unknown  Numbness in lips Numbness in mouth    Numbness in lips, Numbness in mouth    Numbness in mouth    Numbness in lips   Methocarbamol  Nausea Only and Anaphylaxis    Stomach pain . Could not eat  Other Reaction(s): Nausea (finding)  Other Reaction(s): Nausea (finding)    Stomach pain . Could not eat    Stomach pain . Could not eat Stomach pain . Could not eat   Hydrocodone  Anxiety and Other (See Comments)    Heart racing, hard to concentrate, face flushed  Other Reaction(s): Anxiety (finding)   Hydrocodone -Acetaminophen  Anxiety    Face flushed, high blood pressure, headache, shaking.   Prednisone Other (See Comments), Palpitations and Dermatitis    flushing  Other Reaction(s): Hypersensitivity finding (finding)  Face flushed, heart racing   Prednisone & Diphenhydramine  Other (See Comments)    Face flushed, heart racing   Ciprofloxacin Other (See Comments)    Other Reaction(s): Unknown   Dextromethorphan    Dextromethorphan Polistirex Er    Meloxicam  Other (See Comments)    Headache, body aches  Other Reaction(s): Unknown  Headache, body aches    Other reaction(s): Other (See Comments) Headache, body aches  Other reaction(s): Other (See Comments)  Headache, body aches   Nitrofurantoin     Other Reaction(s): Other, Unknown   Pravastatin  Other (See Comments)    joint pains  Other Reaction(s): Other   Amlodipine  Other (See Comments) and Dermatitis    Bp worsened.  Shaky. Insomnia.  Other Reaction(s): Unknown  Bp worsened.  Shaky. Insomnia.   Diphenhydramine  Other (See Comments) and Palpitations    Other Reaction(s): Unknown   Diphenhydramine  Hcl Palpitations   Moxifloxacin Rash and Dermatitis    Other Reaction(s): Unknown  Patient did not state    Review of Systems  Constitutional:  Negative for chills and fever.  Respiratory:  Negative for shortness of breath.   Cardiovascular:  Negative for chest pain.  Gastrointestinal:  Negative for abdominal pain, nausea and vomiting.       Objective:        11/20/2024   10:43 AM 10/25/2024   11:02 AM 10/11/2024    1:31 PM  Vitals with BMI  Height 5' 5 5' 5 5' 5  Weight 171 lbs 168 lbs 170 lbs  BMI 28.46 27.96 28.29  Systolic 124 120 --  Diastolic 78 78 --  Pulse 78 82     No data found.   Physical Exam Vitals reviewed.  Constitutional:      Appearance: Normal appearance. She is normal weight.  Cardiovascular:     Rate and Rhythm: Normal rate and regular rhythm.     Heart sounds: Normal heart sounds.  Pulmonary:     Effort: Pulmonary effort is normal. No respiratory distress.     Breath sounds: Normal breath sounds.  Abdominal:     General: Abdomen is flat. Bowel sounds are normal.     Palpations: Abdomen is soft.     Tenderness: There is no abdominal tenderness.  Neurological:     Mental Status: She is alert and oriented to person, place, and time.  Psychiatric:        Mood and Affect: Mood normal.        Behavior: Behavior normal.     Health Maintenance Due  Topic Date Due   DTaP/Tdap/Td (1 - Tdap) Never done   Zoster Vaccines- Shingrix (1 of 2) Never done   Mammogram  12/30/2023   COVID-19 Vaccine (5 - 2025-26 season) 08/20/2024    There are no preventive care reminders to display for this patient.   Lab Results  Component Value Date   TSH 0.306 (L) 07/18/2024   Lab Results  Component Value Date   WBC 6.0 07/18/2024   HGB 13.3 07/18/2024   HCT 42.5 07/18/2024   MCV 89 07/18/2024   PLT 242 07/18/2024   Lab  Results  Component Value Date   NA 142 07/18/2024   K 5.1 07/18/2024   CHLORIDE 107 11/22/2017   CO2 25 07/18/2024   GLUCOSE 96 07/18/2024   BUN 20 07/18/2024   CREATININE 0.84 07/18/2024   BILITOT 0.5 07/18/2024   ALKPHOS 191 (H) 07/18/2024   AST 17 07/18/2024   ALT 17 07/18/2024   PROT 6.4 07/18/2024   ALBUMIN 4.1 07/18/2024   CALCIUM  9.9 07/18/2024   ANIONGAP 10 12/31/2022   EGFR 73 07/18/2024   Lab Results  Component Value Date   CHOL 143 07/18/2024   Lab Results  Component Value Date   HDL 83 07/18/2024   Lab Results  Component Value Date   LDLCALC 46 07/18/2024   Lab Results  Component Value Date   TRIG 72 07/18/2024   Lab Results  Component Value Date   CHOLHDL 1.7 07/18/2024   No results found for: HGBA1C      Results for orders placed or performed in visit on 11/20/24  POCT URINALYSIS DIP (CLINITEK)   Collection Time: 11/20/24 10:57 AM  Result Value Ref Range   Color, UA yellow yellow  Clarity, UA clear clear   Glucose, UA negative negative mg/dL   Bilirubin, UA negative negative   Ketones, POC UA negative negative mg/dL   Spec Grav, UA 8.984 8.989 - 1.025   Blood, UA negative negative   pH, UA 6.0 5.0 - 8.0   POC PROTEIN,UA negative negative, trace   Urobilinogen, UA 0.2 0.2 or 1.0 E.U./dL   Nitrite, UA Positive (A) Negative   Leukocytes, UA Large (3+) (A) Negative     Assessment & Plan:   Assessment & Plan Acute cystitis without hematuria Symptoms suggest bladder infection. Previous Bactrim  treatment effective. - Prescribed Bactrim  for UTI. - Prescribed Pyridium  for dysuria. - Ordered urine culture, follow-up on results. - Recommended acetic acid douches for odor. Orders:   POCT URINALYSIS DIP (CLINITEK)   Urine Culture     Body mass index is 28.46 kg/m..    Meds ordered this encounter  Medications   sulfamethoxazole -trimethoprim  (BACTRIM  DS) 800-160 MG tablet    Sig: Take 1 tablet by mouth 2 (two) times daily.     Dispense:  14 tablet    Refill:  0   phenazopyridine  (PYRIDIUM ) 200 MG tablet    Sig: Take 1 tablet (200 mg total) by mouth 3 (three) times daily as needed for pain.    Dispense:  10 tablet    Refill:  0    Orders Placed This Encounter  Procedures   Urine Culture   POCT URINALYSIS DIP (CLINITEK)     Follow-up: Return if symptoms worsen or fail to improve.  An After Visit Summary was printed and given to the patient.  Abigail Free, MD [facility] [phone]

## 2024-11-21 ENCOUNTER — Telehealth: Payer: Self-pay

## 2024-11-21 NOTE — Telephone Encounter (Signed)
 Left patient a detailed voicemail that her FORTEO  (3 boxes) is ready for pick up.

## 2024-11-24 ENCOUNTER — Other Ambulatory Visit: Payer: Self-pay | Admitting: Family Medicine

## 2024-11-24 DIAGNOSIS — K219 Gastro-esophageal reflux disease without esophagitis: Secondary | ICD-10-CM

## 2024-11-24 DIAGNOSIS — F418 Other specified anxiety disorders: Secondary | ICD-10-CM

## 2024-11-26 ENCOUNTER — Telehealth: Payer: Self-pay | Admitting: Family Medicine

## 2024-11-26 DIAGNOSIS — M545 Low back pain, unspecified: Secondary | ICD-10-CM

## 2024-11-26 NOTE — Telephone Encounter (Unsigned)
 Copied from CRM #[id number]. Topic: Clinical - Medication Refill >> Nov 26, 2024 10:49 AM Rosina BIRCH wrote: Medication: Oxycodone  HCl 10 MG TABS  Has the patient contacted their pharmacy? Yes (Agent: If no, request that the patient contact the pharmacy for the refill. If patient does not wish to contact the pharmacy document the reason why and proceed with request.) (Agent: If yes, when and what did the pharmacy advise?)  This is the patient's preferred pharmacy:  [id number] [place], [place] - [address] AT [facility] [address] [place] [address] Phone: [phone] Fax: [phone]  Is this the correct pharmacy for this prescription? Yes If no, delete pharmacy and type the correct one.   Has the prescription been filled recently? Yes  Is the patient out of the medication? Yes  Has the patient been seen for an appointment in the last year OR does the patient have an upcoming appointment? Yes  Can we respond through MyChart? Yes  Agent: Please be advised that Rx refills may take up to 3 business days. We ask that you follow-up with your pharmacy.

## 2024-11-27 ENCOUNTER — Ambulatory Visit: Payer: Self-pay | Admitting: Family Medicine

## 2024-11-27 ENCOUNTER — Telehealth: Payer: Self-pay

## 2024-11-27 LAB — URINE CULTURE

## 2024-11-27 MED ORDER — OXYCODONE HCL 10 MG PO TABS: 10.0000 mg | ORAL_TABLET | Freq: Four times a day (QID) | ORAL | 0 refills | Status: DC | PRN

## 2024-11-27 NOTE — Telephone Encounter (Signed)
>>   Nov 27, 2024 10:43 AM Delon DASEN wrote: Patient checking status of refill      Copied from CRM [id number]. Topic: Clinical - Medication Refill >> Nov 26, 2024 10:49 AM Rosina BIRCH wrote: Medication: Oxycodone  HCl 10 MG TABS  Has the patient contacted their pharmacy? Yes (Agent: If no, request that the patient contact the pharmacy for the refill. If patient does not wish to contact the pharmacy document the reason why and proceed with request.) (Agent: If yes, when and what did the pharmacy advise?)  This is the patient's preferred pharmacy:  [id number] [place], [place] - [address] AT [facility] OF [address] [place] [address] Phone: [phone] Fax: [phone]  Is this the correct pharmacy for this prescription? Yes If no, delete pharmacy and type the correct one.   Has the prescription been filled recently? Yes  Is the patient out of the medication? Yes  Has the patient been seen for an appointment in the last year OR does the patient have an upcoming appointment? Yes  Can we respond through MyChart? Yes  Agent: Please be advised that Rx refills may take up to 3 business days. We ask that you follow-up with your pharmacy.

## 2024-11-29 ENCOUNTER — Ambulatory Visit: Payer: Self-pay

## 2024-11-29 ENCOUNTER — Other Ambulatory Visit: Payer: Self-pay | Admitting: Family Medicine

## 2024-11-29 MED ORDER — AMOXICILLIN-POT CLAVULANATE 500-125 MG PO TABS: 1.0000 | ORAL_TABLET | Freq: Three times a day (TID) | ORAL | 0 refills | Status: DC

## 2024-11-29 NOTE — Telephone Encounter (Signed)
 FYI Only or Action Required?: Action required by provider: clinical question for provider.  Patient was last seen in primary care on 11/20/2024 by Sherre Clapper, MD.  Called Nurse Triage reporting Urinary Frequency.  Symptoms began a week ago.  Interventions attempted: Prescription medications: Bactrim .  Symptoms are: gradually worsening.  Triage Disposition: Home Care  Patient/caregiver understands and will follow disposition?: Yes  Reason for Disposition  [1] Taking antibiotic < 72 hours (3 days) for UTI AND [2] painful urination or frequency is SAME (unchanged, not better)  Answer Assessment - Initial Assessment Questions Patient states the urinary frequency did not totally go away and has gotten a bit worse since ending the abx. Patient is unsure if she needs another round of abx. Please advise.   1. MAIN SYMPTOM: What is the main symptom you are concerned about? (e.g., painful urination, urine frequency)     Urinary frequency  2. BETTER-SAME-WORSE: Are you getting better, staying the same, or getting worse compared to how you felt at your last visit to the doctor (most recent medical visit)?     Better  3. PAIN: How bad is the pain?  (e.g., Scale 1-10; mild, moderate, or severe)     Denies  4. FEVER: Do you have a fever? If Yes, ask: What is it, how was it measured, and when did it start?     Denies  5. OTHER SYMPTOMS: Do you have any other symptoms? (e.g., blood in the urine, flank pain, vaginal discharge)     Denies  6. DIAGNOSIS: When was the UTI diagnosed? By whom? Was it a kidney infection, bladder infection or both?     11/20/24  7. ANTIBIOTIC: What antibiotic(s) are you taking? How many times per day?     sulfamethoxazole -trimethoprim  (BACTRIM  DS) 800-160 MG tablet      Sig: Take 1 tablet by mouth 2 (two) times daily.  8. ANTIBIOTIC - START DATE: When did you start taking the antibiotic?     11/20/24  Protocols used: Urinary Tract  Infection on Antibiotic Follow-up Call - [facility]

## 2024-11-29 NOTE — Telephone Encounter (Signed)
 Attempted to contact patient x 1 to discuss symptoms; LVM to return call, Will attempt to contact patient at a later time to further discuss concerns.       Message from [place] B sent at 11/29/2024  1:06 PM EST  Reason for Triage:  Patient finished meds for uti and still experiencing symptoms.. Urgency. Aching feeling when she urinates.

## 2024-11-30 ENCOUNTER — Other Ambulatory Visit: Payer: Self-pay | Admitting: Family Medicine

## 2024-11-30 ENCOUNTER — Telehealth: Payer: Self-pay

## 2024-11-30 ENCOUNTER — Other Ambulatory Visit (HOSPITAL_BASED_OUTPATIENT_CLINIC_OR_DEPARTMENT_OTHER): Payer: Self-pay

## 2024-11-30 DIAGNOSIS — G8929 Other chronic pain: Secondary | ICD-10-CM

## 2024-11-30 MED ORDER — OXYCODONE HCL 10 MG PO TABS
10.0000 mg | ORAL_TABLET | Freq: Four times a day (QID) | ORAL | 0 refills | Status: DC | PRN
  Filled 2024-11-30: qty 120, 30d supply, fill #0

## 2024-11-30 NOTE — Telephone Encounter (Signed)
 Copied from CRM #[id number]. Topic: Clinical - Medication Question >> Nov 30, 2024 11:22 AM Delon DASEN wrote: Reason for CRM: Oxycodone  - pharmacies are out of stock of the 10 mg, need something else

## 2024-12-27 NOTE — Telephone Encounter (Signed)
 Spoke with patient, she lost her application.  Verified address and mailed another application

## 2024-12-28 ENCOUNTER — Other Ambulatory Visit: Payer: Self-pay

## 2024-12-28 DIAGNOSIS — K219 Gastro-esophageal reflux disease without esophagitis: Secondary | ICD-10-CM

## 2024-12-28 MED ORDER — PANTOPRAZOLE SODIUM 40 MG PO TBEC: 40.0000 mg | DELAYED_RELEASE_TABLET | Freq: Every day | ORAL | 1 refills | Status: DC

## 2024-12-31 ENCOUNTER — Other Ambulatory Visit: Payer: Self-pay | Admitting: Family Medicine

## 2024-12-31 DIAGNOSIS — G8929 Other chronic pain: Secondary | ICD-10-CM

## 2024-12-31 MED ORDER — OXYCODONE HCL 10 MG PO TABS: 10.0000 mg | ORAL_TABLET | Freq: Four times a day (QID) | ORAL | 0 refills | Status: AC | PRN

## 2024-12-31 NOTE — Telephone Encounter (Signed)
 Copied from CRM #[id number]. Topic: Clinical - Medication Refill >> Dec 31, 2024 10:42 AM Sophia H wrote: Medication: Oxycodone  HCl 10 MG TABS   Has the patient contacted their pharmacy? Yes, no fills on file and pharmacy advised controlled substance so patient needed to contact clinic.   This is the patient's preferred pharmacy:  [id number] [place], [place] - [address] AT [facility] OF [address] [place] [address] Phone: [phone] Fax: [phone]   Is this the correct pharmacy for this prescription? Yes If no, delete pharmacy and type the correct one.   Has the prescription been filled recently? Yes  Is the patient out of the medication? Yes, completely out, took last pill last night. Needing fill asap   Has the patient been seen for an appointment in the last year OR does the patient have an upcoming appointment? Yes, seen back in December.   Can we respond through MyChart? Yes  Agent: Please be advised that Rx refills may take up to 3 business days. We ask that you follow-up with your pharmacy.

## 2025-01-04 ENCOUNTER — Other Ambulatory Visit: Payer: Self-pay | Admitting: Family Medicine

## 2025-01-04 DIAGNOSIS — C3491 Malignant neoplasm of unspecified part of right bronchus or lung: Secondary | ICD-10-CM

## 2025-01-04 DIAGNOSIS — E890 Postprocedural hypothyroidism: Secondary | ICD-10-CM

## 2025-01-07 ENCOUNTER — Other Ambulatory Visit: Payer: Self-pay

## 2025-01-09 ENCOUNTER — Ambulatory Visit: Admitting: Family Medicine

## 2025-01-15 ENCOUNTER — Ambulatory Visit: Admitting: Physician Assistant

## 2025-01-15 VITALS — BP 132/82 | HR 84 | Temp 97.8°F | Ht 65.0 in | Wt 173.0 lb

## 2025-01-15 DIAGNOSIS — M810 Age-related osteoporosis without current pathological fracture: Secondary | ICD-10-CM

## 2025-01-15 DIAGNOSIS — M797 Fibromyalgia: Secondary | ICD-10-CM

## 2025-01-15 DIAGNOSIS — C3491 Malignant neoplasm of unspecified part of right bronchus or lung: Secondary | ICD-10-CM | POA: Diagnosis not present

## 2025-01-15 DIAGNOSIS — I10 Essential (primary) hypertension: Secondary | ICD-10-CM | POA: Diagnosis not present

## 2025-01-15 DIAGNOSIS — K219 Gastro-esophageal reflux disease without esophagitis: Secondary | ICD-10-CM

## 2025-01-15 DIAGNOSIS — E890 Postprocedural hypothyroidism: Secondary | ICD-10-CM | POA: Diagnosis not present

## 2025-01-15 DIAGNOSIS — F418 Other specified anxiety disorders: Secondary | ICD-10-CM | POA: Diagnosis not present

## 2025-01-15 DIAGNOSIS — F322 Major depressive disorder, single episode, severe without psychotic features: Secondary | ICD-10-CM

## 2025-01-15 DIAGNOSIS — J432 Centrilobular emphysema: Secondary | ICD-10-CM | POA: Diagnosis not present

## 2025-01-15 DIAGNOSIS — M545 Low back pain, unspecified: Secondary | ICD-10-CM

## 2025-01-15 DIAGNOSIS — G8929 Other chronic pain: Secondary | ICD-10-CM

## 2025-01-15 DIAGNOSIS — F1190 Opioid use, unspecified, uncomplicated: Secondary | ICD-10-CM | POA: Diagnosis not present

## 2025-01-15 DIAGNOSIS — E782 Mixed hyperlipidemia: Secondary | ICD-10-CM

## 2025-01-15 DIAGNOSIS — Z1231 Encounter for screening mammogram for malignant neoplasm of breast: Secondary | ICD-10-CM

## 2025-01-15 MED ORDER — ESCITALOPRAM OXALATE 20 MG PO TABS: 20.0000 mg | ORAL_TABLET | Freq: Every day | ORAL | 3 refills | Status: AC

## 2025-01-15 NOTE — Assessment & Plan Note (Addendum)
 Opioid use for chronic pain Chronic pain managed with oxycodone . Effective management, cautious about opioid use. - Continue current oxycodone  regimen.

## 2025-01-15 NOTE — Assessment & Plan Note (Addendum)
 Chronic low back pain, post fusion Chronic pain post spinal fusion. Reports leg numbness and posture difficulty. Further surgery not recommended due to osteoporosis. - Continue current pain management regimen.

## 2025-01-15 NOTE — Assessment & Plan Note (Addendum)
 Weight loss and decreased appetite may relate to thyroid  function. Previous thyroid  levels borderline low. Out of thyroid  medication. - Ordered thyroid  function tests. - Restocked thyroid  medication. Orders:   Thyroid  Panel With TSH

## 2025-01-15 NOTE — Assessment & Plan Note (Addendum)
 Controlled Continue taking Pepcid  40mg , Protonix  40mg  as prescribed Denies any new or worsening symptoms Will adjust treatment based on symptoms

## 2025-01-15 NOTE — Assessment & Plan Note (Addendum)
 Controlled Continue to monitor blood pressure at home Continue taking losartan  50mg  as prescribed Orders:   CBC with Differential/Platelet

## 2025-01-15 NOTE — Progress Notes (Signed)
 "  Subjective:  Patient ID: Victoria Pena, female    DOB: 1951-03-24  Age: 74 y.o. MRN: [id number]  Chief Complaint  Patient presents with   Medical Management of Chronic Issues    HPI: Discussed the use of AI scribe software for clinical note transcription with the patient, who gave verbal consent to proceed.  History of Present Illness Victoria Pena is a 74 year old female who presents with concerns of depression and anxiety.  She reports significant depression and anxiety and feels these are related to her living situation and financial stress. Her granddaughter and great-grandchildren have moved in with her, affecting her privacy. Financially, she has returned to work despite not wanting to. She is on Wellbutrin  300 mg daily and Xanax  as needed, usually twice a day, and has been on Wellbutrin  for many years. She is hesitant to change medications due to past experiences with increased anxiety from other medications. She also takes Lexapro  10 mg daily.  She has osteoporosis and reports a history of easily breaking bones. She experienced a fall in the past year after stepping on a ball, resulting in a hard landing on her shoulder. She uses a cane for walking due to pronation issues and a limp from a knee replacement. She has been out of her osteoporosis shots for three months.   She has chronic pain, primarily in her back, and has undergone two spinal fusion surgeries. She experiences numbness and reports that her surgeon advised against further fusions due to severe osteoporosis. She takes oxycodone  daily for pain management, usually a half or whole pill at night.  She has a history of lung cancer, with half of her lung removed. She has not had a CT scan in two years. She experiences shortness of breath with exertion but exercises daily at home.  She reports a history of thyroid  issues and has noticed weight loss, decreased appetite, and occasional heart racing, which she attributes to  anxiety. Her last thyroid  level was borderline low. She is currently out of thyroid  medication as of tomorrow.  She has a history of family stress, including a strained relationship with her son, who has not spoken to her in several months. She attributes some of her stress to family dynamics and her living situation with her granddaughter and great-grandchildren.        01/15/2025    4:37 PM 10/11/2024    1:46 PM 08/31/2024   11:41 AM 08/01/2024    2:44 PM 07/18/2024    2:37 PM  Depression screen PHQ 2/9  Decreased Interest 1 1 1  0 0  Down, Depressed, Hopeless 1 0  0 0  PHQ - 2 Score 2 1 1  0 0  Altered sleeping 3 3   3   Tired, decreased energy 1 1   1   Change in appetite 3 3   3   Feeling bad or failure about yourself  1 0   0  Trouble concentrating 2 2   2   Moving slowly or fidgety/restless 0 0   0  Suicidal thoughts 0 0   0  PHQ-9 Score 12 10    9    Difficult doing work/chores Somewhat difficult    Not difficult at all     Data saved with a previous flowsheet row definition        01/15/2025    3:27 PM  Fall Risk   Falls in the past year? 0  Number falls in past yr: 0  Injury with  Fall? 0  Risk for fall due to : No Fall Risks  Follow up Falls evaluation completed    Patient Care Team: Sherre Clapper, MD as PCP - General (Family Medicine) Clement Odor, LCSW as Social Worker (Licensed Clinical Social Worker) Kay, Hendricks MATSU, RN as [facility], Provider Not In as Referring Physician (Dentistry) Thyra Therisa FALCON, MD as Referring Physician (Ophthalmology) Joshua Alm Hamilton, MD as Consulting Physician (Neurosurgery) Anderson Alm NOVAK, OD (Optometry)   Review of Systems  Constitutional:  Negative for appetite change, fatigue and fever.  HENT:  Negative for congestion, ear pain, sinus pressure and sore throat.   Respiratory:  Negative for cough, chest tightness, shortness of breath and wheezing.   Cardiovascular:  Negative for chest pain and palpitations.   Gastrointestinal:  Negative for abdominal pain, constipation, diarrhea, nausea and vomiting.  Genitourinary:  Negative for dysuria and hematuria.  Musculoskeletal:  Negative for arthralgias, back pain, joint swelling and myalgias.  Skin:  Negative for rash.  Neurological:  Negative for dizziness, weakness and headaches.  Psychiatric/Behavioral:  Negative for dysphoric mood. The patient is not nervous/anxious.     Medications Ordered Prior to Encounter[1] Past Medical History:  Diagnosis Date   Acquired spondylolisthesis 05/12/2015   Adenocarcinoma of right lung, stage 1 (HCC) 07/26/2016   Anxiety    Arthritis    Arthropathy of lumbar facet joint 04/11/2019   Formatting of this note might be different from the original. Added automatically from request for surgery [id number]   Atherosclerosis of aorta 07/08/2021   Benign essential hypertension 12/27/2016   BMI 30.0-30.9,adult 12/23/2020   COPD (chronic obstructive pulmonary disease) (HCC)    pt denies   Current mild episode of major depressive disorder 03/24/2020   Daytime sleepiness 08/03/2021   Depression    Fibromyalgia 03/24/2020   Gastroesophageal reflux disease without esophagitis 03/24/2020   Graves disease 12/27/2016   Hypertension    Osteoporosis 08/03/2021   Pericardial cyst 02/02/2021   Postoperative hypothyroidism 01/17/2015   Reactive airways dysfunction syndrome, unspecified asthma severity, uncomplicated (HCC) 09/06/2015   S/P lobectomy of lung 07/26/2015   Formatting of this note might be different from the original. Right upper   S/P lumbar spinal fusion 05/11/2018   Thyroid  disease    Toe ulcer, left, limited to breakdown of skin (HCC) 12/23/2020   Past Surgical History:  Procedure Laterality Date   ANKLE SURGERY     bilateral   APPENDECTOMY     BREAST BIOPSY Left 02/25/2020   BREAST BIOPSY Right 08/17/2018   BREAST EXCISIONAL BIOPSY Right    unsure when but marked with scar marker   CHOLECYSTECTOMY      EYE MUSCLE SURGERY Bilateral    4-5 years ago   LAMINECTOMY WITH POSTERIOR LATERAL ARTHRODESIS LEVEL 2 N/A 01/21/2023   Procedure: Laminectomy  - L1-L2 - L2-L3 with non-instrumented posterior lateral fusion;  Surgeon: Joshua Alm RAMAN, MD;  Location: [facility] OR;  Service: Neurosurgery;  Laterality: N/A;   LUMBAR FUSION  2019   L4, 5, 6   REPLACEMENT TOTAL KNEE Left    THYROIDECTOMY      Family History  Problem Relation Age of Onset   Cancer Mother    Cancer Father    Cancer Brother    Autism Grandson    Breast cancer Neg Hx    Social History   Socioeconomic History   Marital status: Widowed    Spouse name: Not on file   Number of children: Not on file  Years of education: Not on file   Highest education level: GED or equivalent  Occupational History   Not on file  Tobacco Use   Smoking status: Former    Current packs/day: 0.00    Average packs/day: 1.5 packs/day for 35.0 years (52.5 ttl pk-yrs)    Types: Cigarettes    Start date: 07/26/1964    Quit date: 07/27/1999    Years since quitting: 25.4   Smokeless tobacco: Never  Vaping Use   Vaping status: Never Used  Substance and Sexual Activity   Alcohol use: No   Drug use: No   Sexual activity: Not Currently  Other Topics Concern   Not on file  Social History Narrative   Not on file   Social Drivers of Health   Tobacco Use: Medium Risk (11/20/2024)   Patient History    Smoking Tobacco Use: Former    Smokeless Tobacco Use: Never    Passive Exposure: Not on file  Financial Resource Strain: Medium Risk (10/11/2024)   Overall Financial Resource Strain (CARDIA)    Difficulty of Paying Living Expenses: Somewhat hard  Food Insecurity: No Food Insecurity (10/11/2024)   Epic    Worried About Radiation Protection Practitioner of Food in the Last Year: Never true    Ran Out of Food in the Last Year: Never true  Transportation Needs: No Transportation Needs (10/11/2024)   Epic    Lack of Transportation (Medical): No    Lack of Transportation  (Non-Medical): No  Physical Activity: Sufficiently Active (10/11/2024)   Exercise Vital Sign    Days of Exercise per Week: 4 days    Minutes of Exercise per Session: 40 min  Stress: Stress Concern Present (10/11/2024)   [employer] of Occupational Health - Occupational Stress Questionnaire    Feeling of Stress: To some extent  Social Connections: Moderately Isolated (10/11/2024)   Social Connection and Isolation Panel    Frequency of Communication with Friends and Family: More than three times a week    Frequency of Social Gatherings with Friends and Family: Three times a week    Attends Religious Services: Never    Active Member of Clubs or Organizations: Yes    Attends Banker Meetings: 1 to 4 times per year    Marital Status: Widowed  Depression (PHQ2-9): High Risk (01/15/2025)   Depression (PHQ2-9)    PHQ-2 Score: 12  Alcohol Screen: Low Risk (10/11/2024)   Alcohol Screen    Last Alcohol Screening Score (AUDIT): 0  Housing: Low Risk (10/11/2024)   Epic    Unable to Pay for Housing in the Last Year: No    Number of Times Moved in the Last Year: 0    Homeless in the Last Year: No  Utilities: Not At Risk (10/11/2024)   Epic    Threatened with loss of utilities: No  Health Literacy: Adequate Health Literacy (10/11/2024)   B1300 Health Literacy    Frequency of need for help with medical instructions: Never    Objective:  BP 132/82 (BP Location: Left Arm, Patient Position: Sitting)   Pulse 84   Temp 97.8 F (36.6 C) (Temporal)   Ht 5' 5 (1.651 m)   Wt 173 lb (78.5 kg)   LMP  (LMP Unknown)   SpO2 96%   BMI 28.79 kg/m      01/15/2025    3:24 PM 11/20/2024   10:43 AM 10/25/2024   11:02 AM  BP/Weight  Systolic BP 132 124 120  Diastolic BP 82 78  78  Wt. (Lbs) 173 171 168  BMI 28.79 kg/m2 28.46 kg/m2 27.96 kg/m2    Physical Exam Vitals reviewed.  Constitutional:      Appearance: Normal appearance.  Cardiovascular:     Rate and Rhythm: Normal  rate and regular rhythm.     Heart sounds: Normal heart sounds.  Pulmonary:     Effort: Pulmonary effort is normal.     Breath sounds: Normal breath sounds.  Abdominal:     General: Bowel sounds are normal.     Palpations: Abdomen is soft.     Tenderness: There is no abdominal tenderness.  Neurological:     Mental Status: She is alert and oriented to person, place, and time.  Psychiatric:        Mood and Affect: Mood normal.        Behavior: Behavior normal.       Lab Results  Component Value Date   WBC 6.0 07/18/2024   HGB 13.3 07/18/2024   HCT 42.5 07/18/2024   PLT 242 07/18/2024   GLUCOSE 96 07/18/2024   CHOL 143 07/18/2024   TRIG 72 07/18/2024   HDL 83 07/18/2024   LDLCALC 46 07/18/2024   ALT 17 07/18/2024   AST 17 07/18/2024   NA 142 07/18/2024   K 5.1 07/18/2024   CL 103 07/18/2024   CREATININE 0.84 07/18/2024   BUN 20 07/18/2024   CO2 25 07/18/2024   TSH 0.306 (L) 07/18/2024   INR 1.0 12/31/2022    Results for orders placed or performed in visit on 11/20/24  POCT URINALYSIS DIP (CLINITEK)   Collection Time: 11/20/24 10:57 AM  Result Value Ref Range   Color, UA yellow yellow   Clarity, UA clear clear   Glucose, UA negative negative mg/dL   Bilirubin, UA negative negative   Ketones, POC UA negative negative mg/dL   Spec Grav, UA 8.984 8.989 - 1.025   Blood, UA negative negative   pH, UA 6.0 5.0 - 8.0   POC PROTEIN,UA negative negative, trace   Urobilinogen, UA 0.2 0.2 or 1.0 E.U./dL   Nitrite, UA Positive (A) Negative   Leukocytes, UA Large (3+) (A) Negative  Urine Culture   Collection Time: 11/20/24 11:17 AM   UR  Result Value Ref Range   Urine Culture, Routine Final report (A)    Organism ID, Bacteria Klebsiella pneumoniae (A)    Antimicrobial Susceptibility Comment   .  Assessment & Plan:   Assessment & Plan Benign essential hypertension Controlled Continue to monitor blood pressure at home Continue taking losartan  50mg  as  prescribed Orders:   CBC with Differential/Platelet  Mixed hyperlipidemia Controlled Continue to monitor diet and exercise Continue taking Lipitor 40mg  as prescribed Labs drawn today Will adjust treatment based on results Orders:   Hemoglobin A1c   Lipid panel  Age-related osteoporosis without current pathological fracture Severe osteoporosis with fracture risk. Out of medication for three months. Plans to resume injections. - Resume osteoporosis injections. - Repeat bone density scan after two years of treatment. Orders:   VITAMIN D  25 Hydroxy (Vit-D Deficiency, Fractures)  Depression with anxiety Admits to some increase in symptoms Will increase Lexapro  to 20mg  Continue taking Wellbutrin  300mg  as prescribed  Orders:   escitalopram  (LEXAPRO ) 20 MG tablet; Take 1 tablet (20 mg total) by mouth daily.  Postoperative hypothyroidism Weight loss and decreased appetite may relate to thyroid  function. Previous thyroid  levels borderline low. Out of thyroid  medication. - Ordered thyroid  function tests. - Restocked thyroid  medication. Orders:  Thyroid  Panel With TSH  Chronic midline low back pain without sciatica Chronic low back pain, post fusion Chronic pain post spinal fusion. Reports leg numbness and posture difficulty. Further surgery not recommended due to osteoporosis. - Continue current pain management regimen.    Gastroesophageal reflux disease without esophagitis Controlled Continue taking Pepcid  40mg , Protonix  40mg  as prescribed Denies any new or worsening symptoms Will adjust treatment based on symptoms    Fibromyalgia Controlled Continue to monitor symptoms Continue to follow up with Dr.Cox to adjust medications Continue taking Lyrica  150mg , Celebrex  200mg  as prescribed    Opioid use Opioid use for chronic pain Chronic pain managed with oxycodone . Effective management, cautious about opioid use. - Continue current oxycodone  regimen.    Adenocarcinoma  of right lung, stage 1 (HCC) Adenocarcinoma of right lung, post resection Post resection of right lung adenocarcinoma. No new lesions. Due for follow-up CT scan. - Referred to Dr. Deatrice for follow-up and CT scan. Orders:   Ambulatory referral to Hematology / Oncology  Centrilobular emphysema [facility]) Continue to monitor symptoms Continue to follow up with Dr. Virgene Orders:   Comprehensive metabolic panel with GFR  Moderately severe major depression (HCC) Major depressive disorder with anxiety High depression and anxiety levels, possibly worsened by life changes. Current medications: Wellbutrin  (max dose) and Lexapro . Lexapro  dose may be increased. Anxiety affects sleep and appetite. She is cautious about medication changes. - Increased Lexapro  dose to 20mg . - Consider talk therapy if no improvement with medication adjustment. Orders:   escitalopram  (LEXAPRO ) 20 MG tablet; Take 1 tablet (20 mg total) by mouth daily.  Screening mammogram for breast cancer  Orders:   MM 3D SCREENING MAMMOGRAM BILATERAL BREAST; Future   General health maintenance Due for mammogram. Regular exercise and walking beneficial. - Ordered mammogram. - Encouraged continuation of regular exercise and walking. Body mass index is 28.79 kg/m.    Meds ordered this encounter  Medications   escitalopram  (LEXAPRO ) 20 MG tablet    Sig: Take 1 tablet (20 mg total) by mouth daily.    Dispense:  90 tablet    Refill:  3    Orders Placed This Encounter  Procedures   MM 3D SCREENING MAMMOGRAM BILATERAL BREAST   CBC with Differential/Platelet   Comprehensive metabolic panel with GFR   Hemoglobin A1c   Lipid panel   VITAMIN D  25 Hydroxy (Vit-D Deficiency, Fractures)   Thyroid  Panel With TSH   Ambulatory referral to Hematology / Oncology       Follow-up: Return in about 6 months (around 07/15/2025) for Chronic, Nola.  An After Visit Summary was printed and given to the patient.    I,Lauren M  Auman,acting as a neurosurgeon for [employer], PA.,have documented all relevant documentation on the behalf of Nola Angles, PA,as directed by  Nola Angles, PA while in the presence of Nola Angles, [place].    Nola Angles, [place] [facility] [phone]     [1]  Current Outpatient Medications on File Prior to Visit  Medication Sig Dispense Refill   ALPRAZolam  (XANAX ) 0.5 MG tablet TAKE 1 TABLET(0.5 MG) BY MOUTH TWICE DAILY AS NEEDED FOR ANXIETY 60 tablet 5   atorvastatin  (LIPITOR) 40 MG tablet TAKE 1 TABLET(40 MG) BY MOUTH DAILY 90 tablet 0   buPROPion  (WELLBUTRIN  XL) 300 MG 24 hr tablet TAKE 1 TABLET(300 MG) BY MOUTH DAILY 90 tablet 0   Calcium  Carb-Cholecalciferol  (CALCIUM  500 + D3) 500-5 MG-MCG TABS Take 3 tablets by mouth daily. 100 tablet 5  calcium  carbonate (TUMS - DOSED IN MG ELEMENTAL CALCIUM ) 500 MG chewable tablet Chew 1 tablet by mouth 2 (two) times daily as needed for indigestion or heartburn.     celecoxib  (CELEBREX ) 200 MG capsule TAKE 1 CAPSULE(200 MG) BY MOUTH DAILY 90 capsule 1   EPINEPHrine  0.3 mg/0.3 mL IJ SOAJ injection Inject 0.3 mg into the muscle as needed for anaphylaxis. 1 each 1   famotidine  (PEPCID ) 40 MG tablet TAKE 1 TABLET(40 MG) BY MOUTH DAILY 90 tablet 1   FORTEO  600 MCG/2.4ML SOPN Inject 20 mcg into the skin daily. 216 mL 1   Insulin  Pen Needle 32G X 4 MM MISC 1 each by Does not apply route daily. 100 each 3   levothyroxine  (SYNTHROID ) 112 MCG tablet TAKE 1 TABLET(112 MCG) BY MOUTH DAILY BEFORE BREAKFAST 90 tablet 0   losartan  (COZAAR ) 50 MG tablet TAKE 1 TABLET(50 MG) BY MOUTH DAILY 90 tablet 1   Multiple Vitamin (MULTIVITAMIN WITH MINERALS) TABS tablet Take 1 tablet by mouth in the morning and at bedtime.     naloxone  (NARCAN ) nasal spray 4 mg/0.1 mL Initial, 1 spray (10 mg) intranasally into 1 nostril. If patients does not respond within 2 to 3 minutes, or responds and then relapses into respiratory depression, administer another dose in the other nostril  with a new device. CALL 911. 2 each 1   Oxycodone  HCl 10 MG TABS Take 1 tablet (10 mg total) by mouth 4 (four) times daily as needed (severe back pain). 120 tablet 0   pantoprazole  (PROTONIX ) 40 MG tablet Take 1 tablet (40 mg total) by mouth daily. 90 tablet 1   pregabalin  (LYRICA ) 150 MG capsule TAKE 1 CAPSULE(150 MG) BY MOUTH TWICE DAILY 180 capsule 1   Teriparatide  560 MCG/2.24ML SOPN INJECT 20 MCG UNDER THE SKIN DAILY 2.4 mL 1   trolamine salicylate (ASPERCREME) 10 % cream Apply 1 Application topically as needed for muscle pain.     Vitamin D , Ergocalciferol , (DRISDOL ) 1.25 MG (50000 UNIT) CAPS capsule TAKE 1 CAPSULE BY MOUTH 2 TIMES A WEEK 24 capsule 0   No current facility-administered medications on file prior to visit.   "

## 2025-01-15 NOTE — Assessment & Plan Note (Addendum)
 Controlled Continue to monitor diet and exercise Continue taking Lipitor 40mg  as prescribed Labs drawn today Will adjust treatment based on results Orders:   Hemoglobin A1c   Lipid panel

## 2025-01-15 NOTE — Assessment & Plan Note (Addendum)
 Controlled Continue to monitor symptoms Continue to follow up with Dr.Cox to adjust medications Continue taking Lyrica  150mg , Celebrex  200mg  as prescribed

## 2025-01-15 NOTE — Assessment & Plan Note (Addendum)
 Continue to monitor symptoms Continue to follow up with Dr. Virgene Orders:   Comprehensive metabolic panel with GFR

## 2025-01-16 ENCOUNTER — Other Ambulatory Visit: Payer: Self-pay | Admitting: Family Medicine

## 2025-01-16 ENCOUNTER — Other Ambulatory Visit: Payer: Self-pay

## 2025-01-16 ENCOUNTER — Telehealth: Payer: Self-pay

## 2025-01-16 DIAGNOSIS — E890 Postprocedural hypothyroidism: Secondary | ICD-10-CM

## 2025-01-16 DIAGNOSIS — C3491 Malignant neoplasm of unspecified part of right bronchus or lung: Secondary | ICD-10-CM

## 2025-01-16 DIAGNOSIS — K219 Gastro-esophageal reflux disease without esophagitis: Secondary | ICD-10-CM

## 2025-01-16 LAB — COMPREHENSIVE METABOLIC PANEL WITH GFR
ALT: 14 [IU]/L (ref 0–32)
AST: 17 [IU]/L (ref 0–40)
Albumin: 4.1 g/dL (ref 3.8–4.8)
Alkaline Phosphatase: 178 [IU]/L — ABNORMAL HIGH (ref 49–135)
BUN/Creatinine Ratio: 21 (ref 12–28)
BUN: 18 mg/dL (ref 8–27)
Bilirubin Total: 0.4 mg/dL (ref 0.0–1.2)
CO2: 26 mmol/L (ref 20–29)
Calcium: 9.2 mg/dL (ref 8.7–10.3)
Chloride: 108 mmol/L — ABNORMAL HIGH (ref 96–106)
Creatinine, Ser: 0.84 mg/dL (ref 0.57–1.00)
Globulin, Total: 2.4 g/dL (ref 1.5–4.5)
Glucose: 114 mg/dL — ABNORMAL HIGH (ref 70–99)
Potassium: 4.1 mmol/L (ref 3.5–5.2)
Sodium: 145 mmol/L — ABNORMAL HIGH (ref 134–144)
Total Protein: 6.5 g/dL (ref 6.0–8.5)
eGFR: 73 mL/min/{1.73_m2}

## 2025-01-16 LAB — LIPID PANEL
Chol/HDL Ratio: 1.6 ratio (ref 0.0–4.4)
Cholesterol, Total: 145 mg/dL (ref 100–199)
HDL: 90 mg/dL
LDL Chol Calc (NIH): 41 mg/dL (ref 0–99)
Triglycerides: 72 mg/dL (ref 0–149)
VLDL Cholesterol Cal: 14 mg/dL (ref 5–40)

## 2025-01-16 LAB — HEMOGLOBIN A1C
Est. average glucose Bld gHb Est-mCnc: 120 mg/dL
Hgb A1c MFr Bld: 5.8 % — ABNORMAL HIGH (ref 4.8–5.6)

## 2025-01-16 LAB — CBC WITH DIFFERENTIAL/PLATELET
Basophils Absolute: 0 10*3/uL (ref 0.0–0.2)
Basos: 1 %
EOS (ABSOLUTE): 0.1 10*3/uL (ref 0.0–0.4)
Eos: 1 %
Hematocrit: 39.6 % (ref 34.0–46.6)
Hemoglobin: 12.4 g/dL (ref 11.1–15.9)
Immature Grans (Abs): 0 10*3/uL (ref 0.0–0.1)
Immature Granulocytes: 0 %
Lymphocytes Absolute: 1.5 10*3/uL (ref 0.7–3.1)
Lymphs: 25 %
MCH: 27.3 pg (ref 26.6–33.0)
MCHC: 31.3 g/dL — ABNORMAL LOW (ref 31.5–35.7)
MCV: 87 fL (ref 79–97)
Monocytes Absolute: 0.6 10*3/uL (ref 0.1–0.9)
Monocytes: 10 %
Neutrophils Absolute: 3.8 10*3/uL (ref 1.4–7.0)
Neutrophils: 63 %
Platelets: 232 10*3/uL (ref 150–450)
RBC: 4.54 x10E6/uL (ref 3.77–5.28)
RDW: 13.1 % (ref 11.7–15.4)
WBC: 6 10*3/uL (ref 3.4–10.8)

## 2025-01-16 LAB — VITAMIN D 25 HYDROXY (VIT D DEFICIENCY, FRACTURES): Vit D, 25-Hydroxy: 31 ng/mL (ref 30.0–100.0)

## 2025-01-16 LAB — THYROID PANEL WITH TSH
Free Thyroxine Index: 2.9 (ref 1.2–4.9)
T3 Uptake Ratio: 33 % (ref 24–39)
T4, Total: 8.8 ug/dL (ref 4.5–12.0)
TSH: 0.669 u[IU]/mL (ref 0.450–4.500)

## 2025-01-16 MED ORDER — LEVOTHYROXINE SODIUM 112 MCG PO TABS: 112.0000 ug | ORAL_TABLET | Freq: Every day | ORAL | 1 refills | Status: AC

## 2025-01-16 NOTE — Progress Notes (Signed)
" ° °  01/16/2025  Patient ID: Nathanel JONETTA Isaacs, female   DOB: 1951/07/13, 74 y.o.   MRN: [id number]  Received patient portion of lilly cares application up front at [facility]. Requested for amber to email to nancy bozarth.  Lang Sieve, PharmD, BCGP Clinical Pharmacist  [phone]  "

## 2025-01-16 NOTE — Telephone Encounter (Signed)
 PAP: Application for Forte[employer]  has been submitted to [employer], via fax

## 2025-01-16 NOTE — Telephone Encounter (Signed)
 Patient is waiting on labs results to see if she needs to continue her levothyroxine  112 mg or change it. She has been out of her medication for 2 days.  Copied from CRM #[id number]. Topic: Clinical - Medication Question >> Jan 16, 2025 12:00 PM Geneva B wrote: Reason for CRM: patient has questions about her thyroid  rx levothyroxine  (SYNTHROID ) 112 MCG tabletplease call pt back [phone]

## 2025-01-16 NOTE — Progress Notes (Signed)
 Pt returned this NNs call. NN explained to the pt that her referral was reviewed with Dr Sherrod and since at this time there is no evidence of dz recurrence, she does not need to be followed by oncology at this time. Pt verbalized understanding that she doesn't need to be seen and her appt will be cancelled.

## 2025-01-16 NOTE — Telephone Encounter (Signed)
Patient made aware. Refill sent.

## 2025-01-17 ENCOUNTER — Ambulatory Visit: Payer: Self-pay | Admitting: Physician Assistant

## 2025-01-17 ENCOUNTER — Other Ambulatory Visit: Payer: Self-pay | Admitting: Physician Assistant

## 2025-01-17 NOTE — Telephone Encounter (Signed)
 PAP: Patient assistance application for Trulicity has been approved by PAP Companies: [employer] from 1/29/20206 to 12/19/2025. Medication should be delivered to PAP Delivery: Home. For further shipping updates, please contact [employer] at [phone]. Patient ID is: [id number]

## 2025-01-18 ENCOUNTER — Other Ambulatory Visit: Payer: Self-pay

## 2025-01-18 DIAGNOSIS — K219 Gastro-esophageal reflux disease without esophagitis: Secondary | ICD-10-CM

## 2025-01-18 MED ORDER — PANTOPRAZOLE SODIUM 40 MG PO TBEC: 40.0000 mg | DELAYED_RELEASE_TABLET | Freq: Every day | ORAL | 1 refills | Status: AC

## 2025-01-19 ENCOUNTER — Encounter: Payer: Self-pay | Admitting: Physician Assistant

## 2025-01-29 ENCOUNTER — Inpatient Hospital Stay

## 2025-01-29 ENCOUNTER — Inpatient Hospital Stay: Admitting: Internal Medicine

## 2025-07-15 ENCOUNTER — Ambulatory Visit: Admitting: Family Medicine

## 2025-10-17 ENCOUNTER — Ambulatory Visit
